# Patient Record
Sex: Female | Born: 1955 | Race: Black or African American | Hispanic: No | Marital: Single | Smoking: Current some day smoker
Health system: Southern US, Community
[De-identification: ages and names within clinical notes are randomized; demographics above are authoritative.]

## PROBLEM LIST (undated history)

## (undated) DIAGNOSIS — T8859XA Other complications of anesthesia, initial encounter: Secondary | ICD-10-CM

## (undated) DIAGNOSIS — I209 Angina pectoris, unspecified: Secondary | ICD-10-CM

## (undated) DIAGNOSIS — E119 Type 2 diabetes mellitus without complications: Secondary | ICD-10-CM

## (undated) DIAGNOSIS — G43909 Migraine, unspecified, not intractable, without status migrainosus: Secondary | ICD-10-CM

## (undated) DIAGNOSIS — B001 Herpesviral vesicular dermatitis: Secondary | ICD-10-CM

## (undated) DIAGNOSIS — M1990 Unspecified osteoarthritis, unspecified site: Secondary | ICD-10-CM

## (undated) DIAGNOSIS — R51 Headache: Secondary | ICD-10-CM

## (undated) DIAGNOSIS — F419 Anxiety disorder, unspecified: Secondary | ICD-10-CM

## (undated) DIAGNOSIS — E7800 Pure hypercholesterolemia, unspecified: Secondary | ICD-10-CM

## (undated) DIAGNOSIS — F259 Schizoaffective disorder, unspecified: Secondary | ICD-10-CM

## (undated) DIAGNOSIS — F32A Depression, unspecified: Secondary | ICD-10-CM

## (undated) DIAGNOSIS — K219 Gastro-esophageal reflux disease without esophagitis: Secondary | ICD-10-CM

## (undated) DIAGNOSIS — F329 Major depressive disorder, single episode, unspecified: Secondary | ICD-10-CM

## (undated) DIAGNOSIS — I10 Essential (primary) hypertension: Secondary | ICD-10-CM

## (undated) DIAGNOSIS — R519 Headache, unspecified: Secondary | ICD-10-CM

## (undated) DIAGNOSIS — F319 Bipolar disorder, unspecified: Secondary | ICD-10-CM

## (undated) DIAGNOSIS — G5600 Carpal tunnel syndrome, unspecified upper limb: Secondary | ICD-10-CM

## (undated) DIAGNOSIS — K635 Polyp of colon: Secondary | ICD-10-CM

## (undated) HISTORY — PX: HEMORRHOID SURGERY: SHX153

## (undated) HISTORY — DX: Bipolar disorder, unspecified: F31.9

## (undated) HISTORY — DX: Unspecified osteoarthritis, unspecified site: M19.90

## (undated) HISTORY — DX: Polyp of colon: K63.5

## (undated) HISTORY — PX: OTHER SURGICAL HISTORY: SHX169

## (undated) HISTORY — DX: Gastro-esophageal reflux disease without esophagitis: K21.9

## (undated) HISTORY — PX: MULTIPLE TOOTH EXTRACTIONS: SHX2053

## (undated) HISTORY — DX: Carpal tunnel syndrome, unspecified upper limb: G56.00

## (undated) HISTORY — DX: Depression, unspecified: F32.A

## (undated) HISTORY — DX: Essential (primary) hypertension: I10

## (undated) HISTORY — DX: Herpesviral vesicular dermatitis: B00.1

## (undated) HISTORY — DX: Type 2 diabetes mellitus without complications: E11.9

## (undated) HISTORY — DX: Pure hypercholesterolemia, unspecified: E78.00

## (undated) HISTORY — PX: PITUITARY SURGERY: SHX203

## (undated) HISTORY — PX: CARPAL TUNNEL RELEASE: SHX101

## (undated) HISTORY — DX: Major depressive disorder, single episode, unspecified: F32.9

## (undated) HISTORY — DX: Headache: R51

## (undated) HISTORY — DX: Headache, unspecified: R51.9

---

## 1997-11-20 ENCOUNTER — Emergency Department (HOSPITAL_COMMUNITY): Admission: EM | Admit: 1997-11-20 | Discharge: 1997-11-20 | Payer: Self-pay | Admitting: Emergency Medicine

## 2000-10-23 ENCOUNTER — Emergency Department (HOSPITAL_COMMUNITY): Admission: EM | Admit: 2000-10-23 | Discharge: 2000-10-23 | Payer: Self-pay | Admitting: *Deleted

## 2000-10-23 ENCOUNTER — Encounter: Payer: Self-pay | Admitting: Emergency Medicine

## 2000-11-15 ENCOUNTER — Emergency Department (HOSPITAL_COMMUNITY): Admission: EM | Admit: 2000-11-15 | Discharge: 2000-11-15 | Payer: Self-pay | Admitting: Emergency Medicine

## 2001-05-25 ENCOUNTER — Emergency Department (HOSPITAL_COMMUNITY): Admission: EM | Admit: 2001-05-25 | Discharge: 2001-05-25 | Payer: Self-pay | Admitting: Emergency Medicine

## 2001-09-27 ENCOUNTER — Emergency Department (HOSPITAL_COMMUNITY): Admission: EM | Admit: 2001-09-27 | Discharge: 2001-09-27 | Payer: Self-pay | Admitting: Emergency Medicine

## 2002-02-04 ENCOUNTER — Encounter: Payer: Self-pay | Admitting: Emergency Medicine

## 2002-02-04 ENCOUNTER — Emergency Department (HOSPITAL_COMMUNITY): Admission: EM | Admit: 2002-02-04 | Discharge: 2002-02-04 | Payer: Self-pay | Admitting: Emergency Medicine

## 2002-08-28 ENCOUNTER — Ambulatory Visit (HOSPITAL_COMMUNITY): Admission: RE | Admit: 2002-08-28 | Discharge: 2002-08-28 | Payer: Self-pay | Admitting: Family Medicine

## 2002-08-28 ENCOUNTER — Encounter: Payer: Self-pay | Admitting: Family Medicine

## 2002-09-26 ENCOUNTER — Encounter: Payer: Self-pay | Admitting: *Deleted

## 2002-09-26 ENCOUNTER — Ambulatory Visit (HOSPITAL_COMMUNITY): Admission: RE | Admit: 2002-09-26 | Discharge: 2002-09-26 | Payer: Self-pay | Admitting: *Deleted

## 2002-12-02 ENCOUNTER — Emergency Department (HOSPITAL_COMMUNITY): Admission: EM | Admit: 2002-12-02 | Discharge: 2002-12-02 | Payer: Self-pay | Admitting: Emergency Medicine

## 2003-10-17 ENCOUNTER — Emergency Department (HOSPITAL_COMMUNITY): Admission: EM | Admit: 2003-10-17 | Discharge: 2003-10-17 | Payer: Self-pay | Admitting: *Deleted

## 2003-10-18 ENCOUNTER — Ambulatory Visit (HOSPITAL_COMMUNITY): Admission: RE | Admit: 2003-10-18 | Discharge: 2003-10-18 | Payer: Self-pay | Admitting: Emergency Medicine

## 2005-03-28 ENCOUNTER — Inpatient Hospital Stay (HOSPITAL_COMMUNITY): Admission: AD | Admit: 2005-03-28 | Discharge: 2005-04-07 | Payer: Self-pay | Admitting: Psychiatry

## 2005-03-29 ENCOUNTER — Ambulatory Visit: Payer: Self-pay | Admitting: Psychiatry

## 2006-05-24 ENCOUNTER — Emergency Department (HOSPITAL_COMMUNITY): Admission: EM | Admit: 2006-05-24 | Discharge: 2006-05-24 | Payer: Self-pay | Admitting: Emergency Medicine

## 2007-02-04 ENCOUNTER — Telehealth (INDEPENDENT_AMBULATORY_CARE_PROVIDER_SITE_OTHER): Payer: Self-pay | Admitting: *Deleted

## 2007-03-24 ENCOUNTER — Ambulatory Visit: Payer: Self-pay | Admitting: Internal Medicine

## 2007-03-24 DIAGNOSIS — F323 Major depressive disorder, single episode, severe with psychotic features: Secondary | ICD-10-CM

## 2007-03-24 DIAGNOSIS — F411 Generalized anxiety disorder: Secondary | ICD-10-CM

## 2007-03-24 DIAGNOSIS — M25519 Pain in unspecified shoulder: Secondary | ICD-10-CM

## 2007-03-24 DIAGNOSIS — K089 Disorder of teeth and supporting structures, unspecified: Secondary | ICD-10-CM | POA: Insufficient documentation

## 2007-03-30 ENCOUNTER — Encounter (INDEPENDENT_AMBULATORY_CARE_PROVIDER_SITE_OTHER): Payer: Self-pay | Admitting: Internal Medicine

## 2007-04-07 HISTORY — PX: NECK SURGERY: SHX720

## 2007-05-10 ENCOUNTER — Other Ambulatory Visit: Admission: RE | Admit: 2007-05-10 | Discharge: 2007-05-10 | Payer: Self-pay | Admitting: Internal Medicine

## 2007-05-10 ENCOUNTER — Ambulatory Visit: Payer: Self-pay | Admitting: Internal Medicine

## 2007-05-10 ENCOUNTER — Encounter (INDEPENDENT_AMBULATORY_CARE_PROVIDER_SITE_OTHER): Payer: Self-pay | Admitting: Internal Medicine

## 2007-05-10 DIAGNOSIS — B354 Tinea corporis: Secondary | ICD-10-CM | POA: Insufficient documentation

## 2007-05-10 LAB — CONVERTED CEMR LAB
Glucose, Urine, Semiquant: NEGATIVE
KOH Prep: NEGATIVE
Protein, U semiquant: NEGATIVE
Urobilinogen, UA: 0.2
pH: 7

## 2007-05-12 ENCOUNTER — Encounter (INDEPENDENT_AMBULATORY_CARE_PROVIDER_SITE_OTHER): Payer: Self-pay | Admitting: Internal Medicine

## 2007-05-16 ENCOUNTER — Ambulatory Visit (HOSPITAL_COMMUNITY): Admission: RE | Admit: 2007-05-16 | Discharge: 2007-05-16 | Payer: Self-pay | Admitting: Family Medicine

## 2007-05-17 ENCOUNTER — Telehealth (INDEPENDENT_AMBULATORY_CARE_PROVIDER_SITE_OTHER): Payer: Self-pay | Admitting: Internal Medicine

## 2007-05-17 ENCOUNTER — Encounter (INDEPENDENT_AMBULATORY_CARE_PROVIDER_SITE_OTHER): Payer: Self-pay | Admitting: Internal Medicine

## 2007-05-21 ENCOUNTER — Encounter (INDEPENDENT_AMBULATORY_CARE_PROVIDER_SITE_OTHER): Payer: Self-pay | Admitting: Internal Medicine

## 2007-05-21 DIAGNOSIS — M51379 Other intervertebral disc degeneration, lumbosacral region without mention of lumbar back pain or lower extremity pain: Secondary | ICD-10-CM | POA: Insufficient documentation

## 2007-05-21 DIAGNOSIS — M5137 Other intervertebral disc degeneration, lumbosacral region: Secondary | ICD-10-CM

## 2007-05-24 ENCOUNTER — Ambulatory Visit: Payer: Self-pay | Admitting: Internal Medicine

## 2007-05-24 ENCOUNTER — Telehealth (INDEPENDENT_AMBULATORY_CARE_PROVIDER_SITE_OTHER): Payer: Self-pay | Admitting: Internal Medicine

## 2007-05-26 ENCOUNTER — Encounter (INDEPENDENT_AMBULATORY_CARE_PROVIDER_SITE_OTHER): Payer: Self-pay | Admitting: Internal Medicine

## 2007-05-29 ENCOUNTER — Encounter (INDEPENDENT_AMBULATORY_CARE_PROVIDER_SITE_OTHER): Payer: Self-pay | Admitting: Internal Medicine

## 2007-05-29 LAB — CONVERTED CEMR LAB
ALT: <8 units/L (ref 0–35)
Albumin: 4.3 g/dL (ref 3.5–5.2)
Alkaline Phosphatase: 81 units/L (ref 39–117)
Basophils Relative: 0 % (ref 0–1)
Calcium: 9.9 mg/dL (ref 8.4–10.5)
Chloride: 101 meq/L (ref 96–112)
Creatinine, Ser: 0.75 mg/dL (ref 0.40–1.20)
HDL: 47 mg/dL (ref >39)
Lymphocytes Relative: 45 % (ref 12–46)
MCHC: 33.8 g/dL (ref 30.0–36.0)
MCV: 86.2 fL (ref 78.0–100.0)
Monocytes Relative: 10 % (ref 3–12)
Neutro Abs: 2.7 10*3/uL (ref 1.7–7.7)
Neutrophils Relative %: 42 % — ABNORMAL LOW (ref 43–77)
Platelets: 372 10*3/uL (ref 150–400)
Potassium: 4 meq/L (ref 3.5–5.3)
RBC: 4.43 M/uL (ref 3.87–5.11)
Total Bilirubin: 0.6 mg/dL (ref 0.3–1.2)
Triglycerides: 114 mg/dL (ref <150)

## 2007-08-15 ENCOUNTER — Encounter: Admission: RE | Admit: 2007-08-15 | Discharge: 2007-08-16 | Payer: Self-pay | Admitting: Neurological Surgery

## 2007-09-02 ENCOUNTER — Ambulatory Visit (HOSPITAL_COMMUNITY): Admission: RE | Admit: 2007-09-02 | Discharge: 2007-09-03 | Payer: Self-pay | Admitting: Neurological Surgery

## 2007-10-03 ENCOUNTER — Encounter: Admission: RE | Admit: 2007-10-03 | Discharge: 2007-10-03 | Payer: Self-pay | Admitting: Neurological Surgery

## 2007-12-06 ENCOUNTER — Emergency Department (HOSPITAL_COMMUNITY): Admission: EM | Admit: 2007-12-06 | Discharge: 2007-12-06 | Payer: Self-pay | Admitting: Emergency Medicine

## 2007-12-06 ENCOUNTER — Encounter: Admission: RE | Admit: 2007-12-06 | Discharge: 2007-12-06 | Payer: Self-pay | Admitting: Neurological Surgery

## 2007-12-20 ENCOUNTER — Emergency Department (HOSPITAL_COMMUNITY): Admission: EM | Admit: 2007-12-20 | Discharge: 2007-12-20 | Payer: Self-pay | Admitting: Emergency Medicine

## 2008-04-04 ENCOUNTER — Encounter: Admission: RE | Admit: 2008-04-04 | Discharge: 2008-04-05 | Payer: Self-pay | Admitting: Sports Medicine

## 2008-05-31 ENCOUNTER — Other Ambulatory Visit: Admission: RE | Admit: 2008-05-31 | Discharge: 2008-05-31 | Payer: Self-pay | Admitting: Family Medicine

## 2008-07-03 ENCOUNTER — Ambulatory Visit (HOSPITAL_COMMUNITY): Admission: RE | Admit: 2008-07-03 | Discharge: 2008-07-03 | Payer: Self-pay | Admitting: Family Medicine

## 2008-08-14 ENCOUNTER — Encounter: Admission: RE | Admit: 2008-08-14 | Discharge: 2008-08-14 | Payer: Self-pay | Admitting: Family Medicine

## 2008-09-24 ENCOUNTER — Encounter: Admission: RE | Admit: 2008-09-24 | Discharge: 2008-09-24 | Payer: Self-pay | Admitting: Neurological Surgery

## 2008-10-01 ENCOUNTER — Encounter: Admission: RE | Admit: 2008-10-01 | Discharge: 2008-10-01 | Payer: Self-pay | Admitting: Neurological Surgery

## 2008-12-19 ENCOUNTER — Emergency Department (HOSPITAL_COMMUNITY): Admission: EM | Admit: 2008-12-19 | Discharge: 2008-12-19 | Payer: Self-pay | Admitting: Family Medicine

## 2008-12-28 ENCOUNTER — Ambulatory Visit (HOSPITAL_COMMUNITY): Admission: RE | Admit: 2008-12-28 | Discharge: 2008-12-28 | Payer: Self-pay | Admitting: Neurological Surgery

## 2009-01-17 ENCOUNTER — Ambulatory Visit (HOSPITAL_COMMUNITY): Admission: RE | Admit: 2009-01-17 | Discharge: 2009-01-17 | Payer: Self-pay | Admitting: Neurological Surgery

## 2009-04-06 HISTORY — PX: OTHER SURGICAL HISTORY: SHX169

## 2009-04-08 ENCOUNTER — Encounter: Admission: RE | Admit: 2009-04-08 | Discharge: 2009-04-08 | Payer: Self-pay | Admitting: Neurological Surgery

## 2009-04-23 ENCOUNTER — Encounter: Admission: RE | Admit: 2009-04-23 | Discharge: 2009-04-23 | Payer: Self-pay | Admitting: General Surgery

## 2009-04-26 ENCOUNTER — Ambulatory Visit (HOSPITAL_BASED_OUTPATIENT_CLINIC_OR_DEPARTMENT_OTHER): Admission: RE | Admit: 2009-04-26 | Discharge: 2009-04-26 | Payer: Self-pay | Admitting: General Surgery

## 2009-04-29 ENCOUNTER — Encounter: Admission: RE | Admit: 2009-04-29 | Discharge: 2009-07-28 | Payer: Self-pay | Admitting: Orthopedic Surgery

## 2009-06-14 ENCOUNTER — Other Ambulatory Visit: Admission: RE | Admit: 2009-06-14 | Discharge: 2009-06-14 | Payer: Self-pay | Admitting: Family Medicine

## 2009-06-21 ENCOUNTER — Encounter: Admission: RE | Admit: 2009-06-21 | Discharge: 2009-06-21 | Payer: Self-pay | Admitting: Orthopedic Surgery

## 2009-07-16 ENCOUNTER — Ambulatory Visit (HOSPITAL_COMMUNITY): Admission: RE | Admit: 2009-07-16 | Discharge: 2009-07-16 | Payer: Self-pay | Admitting: Family Medicine

## 2009-08-21 ENCOUNTER — Ambulatory Visit (HOSPITAL_BASED_OUTPATIENT_CLINIC_OR_DEPARTMENT_OTHER): Admission: RE | Admit: 2009-08-21 | Discharge: 2009-08-21 | Payer: Self-pay | Admitting: Orthopedic Surgery

## 2010-04-27 ENCOUNTER — Encounter: Payer: Self-pay | Admitting: Orthopedic Surgery

## 2010-04-28 ENCOUNTER — Encounter: Payer: Self-pay | Admitting: Family Medicine

## 2010-06-22 LAB — BASIC METABOLIC PANEL
CO2: 27 mEq/L (ref 19–32)
Chloride: 105 mEq/L (ref 96–112)
GFR calc Af Amer: >60 mL/min (ref >60)
GFR calc non Af Amer: >60 mL/min (ref >60)
Potassium: 3.9 mEq/L (ref 3.5–5.1)

## 2010-06-22 LAB — CBC
MCHC: 34.4 g/dL (ref 30.0–36.0)
Platelets: 395 10*3/uL (ref 150–400)
RDW: 14 % (ref 11.5–15.5)

## 2010-06-22 LAB — DIFFERENTIAL
Basophils Absolute: 0 10*3/uL (ref 0.0–0.1)
Basophils Relative: 1 % (ref 0–1)
Lymphocytes Relative: 46 % (ref 12–46)
Neutro Abs: 2.4 10*3/uL (ref 1.7–7.7)

## 2010-06-23 LAB — BASIC METABOLIC PANEL
BUN: 12 mg/dL (ref 6–23)
CO2: 29 mEq/L (ref 19–32)
Calcium: 10.7 mg/dL — ABNORMAL HIGH (ref 8.4–10.5)
Chloride: 104 mEq/L (ref 96–112)
Creatinine, Ser: 1.05 mg/dL (ref 0.4–1.2)
GFR calc Af Amer: >60 mL/min (ref >60)
GFR calc non Af Amer: 55 mL/min — ABNORMAL LOW (ref >60)
Glucose, Bld: 95 mg/dL (ref 70–99)
Potassium: 4 mEq/L (ref 3.5–5.1)
Sodium: 138 mEq/L (ref 135–145)

## 2010-06-23 LAB — POCT HEMOGLOBIN-HEMACUE: Hemoglobin: 14.3 g/dL (ref 12.0–15.0)

## 2010-07-10 LAB — DIFFERENTIAL
Eosinophils Absolute: 0.2 10*3/uL (ref 0.0–0.7)
Lymphocytes Relative: 52 % — ABNORMAL HIGH (ref 12–46)
Lymphs Abs: 2.9 10*3/uL (ref 0.7–4.0)
Monocytes Relative: 5 % (ref 3–12)
Neutrophils Relative %: 38 % — ABNORMAL LOW (ref 43–77)

## 2010-07-10 LAB — CBC
MCV: 87.6 fL (ref 78.0–100.0)
Platelets: 401 10*3/uL — ABNORMAL HIGH (ref 150–400)
RBC: 4.35 MIL/uL (ref 3.87–5.11)
WBC: 5.6 10*3/uL (ref 4.0–10.5)

## 2010-07-10 LAB — BASIC METABOLIC PANEL
BUN: 9 mg/dL (ref 6–23)
Chloride: 102 mEq/L (ref 96–112)
Creatinine, Ser: 0.93 mg/dL (ref 0.4–1.2)
GFR calc Af Amer: >60 mL/min (ref >60)
GFR calc non Af Amer: >60 mL/min (ref >60)
Potassium: 3.3 mEq/L — ABNORMAL LOW (ref 3.5–5.1)

## 2010-07-10 LAB — APTT: aPTT: 26 seconds (ref 24–37)

## 2010-07-10 LAB — PROTIME-INR
INR: 0.89 (ref 0.00–1.49)
Prothrombin Time: 12 seconds (ref 11.6–15.2)

## 2010-07-11 LAB — DIFFERENTIAL
Basophils Absolute: 0.1 10*3/uL (ref 0.0–0.1)
Eosinophils Relative: 3 % (ref 0–5)
Lymphocytes Relative: 43 % (ref 12–46)
Lymphs Abs: 2.6 10*3/uL (ref 0.7–4.0)
Monocytes Absolute: 0.5 10*3/uL (ref 0.1–1.0)
Monocytes Relative: 8 % (ref 3–12)
Neutro Abs: 2.7 10*3/uL (ref 1.7–7.7)

## 2010-07-11 LAB — CBC
HCT: 37 % (ref 36.0–46.0)
Hemoglobin: 12.5 g/dL (ref 12.0–15.0)
RBC: 4.18 MIL/uL (ref 3.87–5.11)
RDW: 14.5 % (ref 11.5–15.5)
WBC: 6 10*3/uL (ref 4.0–10.5)

## 2010-07-11 LAB — BASIC METABOLIC PANEL
Calcium: 10 mg/dL (ref 8.4–10.5)
GFR calc Af Amer: >60 mL/min (ref >60)
GFR calc non Af Amer: 58 mL/min — ABNORMAL LOW (ref >60)
Potassium: 4 mEq/L (ref 3.5–5.1)
Sodium: 138 mEq/L (ref 135–145)

## 2010-07-11 LAB — APTT: aPTT: 26 seconds (ref 24–37)

## 2010-07-25 ENCOUNTER — Other Ambulatory Visit (HOSPITAL_COMMUNITY): Payer: Self-pay | Admitting: Family Medicine

## 2010-08-04 ENCOUNTER — Ambulatory Visit (HOSPITAL_COMMUNITY): Payer: Self-pay

## 2010-08-04 ENCOUNTER — Encounter (HOSPITAL_COMMUNITY)
Admission: RE | Admit: 2010-08-04 | Discharge: 2010-08-04 | Disposition: A | Discharge status: Home / Self Care | Payer: Medicaid Other | Source: Ambulatory Visit | Attending: Family Medicine | Admitting: Family Medicine

## 2010-08-04 DIAGNOSIS — E213 Hyperparathyroidism, unspecified: Secondary | ICD-10-CM | POA: Insufficient documentation

## 2010-08-04 MED ORDER — TECHNETIUM TC 99M SESTAMIBI - CARDIOLITE
23.5000 | Freq: Once | INTRAVENOUS | Status: AC | PRN
  Administered 2010-08-04: 08:00:00 23.5 via INTRAVENOUS

## 2010-08-19 NOTE — Op Note (Signed)
NAME:  Gloria Lewis, PRIEGO NO.:  [id number]   MEDICAL RECORD NO.:  [id number]          PATIENT TYPE:  OIB   LOCATION:  3528                         FACILITY:  [facility]   PHYSICIAN:  Tia Alert, MD     DATE OF BIRTH:  [birth year]   DATE OF PROCEDURE:  09/02/2007  DATE OF DISCHARGE:                               OPERATIVE REPORT   PREOPERATIVE DIAGNOSES:  Cervical spondylosis with neural foraminal  stenosis, C5-C6 and C6-C7 with left arm pain.   POSTOPERATIVE DIAGNOSES:  Cervical spondylosis with neural foraminal  stenosis, C5-C6 and C6-C7 with left arm pain.   PROCEDURES:  1. Decompressive anterior cervical diskectomy, C5-C6 and C6-C7.  2. Anterior cervical arthrodesis, C5-C6 and C6-C7, utilizing a 6-mm      corticocancellous allograft at C5-C6 and a 7-mm corticocancellous      allograft at C6-C7.  3. Anterior cervical plating, C5-C7, inclusive utilizing a 38-mm      NuVasive helix plate.   SURGEON:  Tia Alert, MD.   ASSISTANT:  Kathaleen Maser. Pool, MD   ANESTHESIA:  General endotracheal.   COMPLICATIONS:  None apparent.   INDICATIONS FOR PROCEDURE:  Ms. Tyer is a 55 year old female who was  referred with severe left arm pain.  She had an MRI, which showed  significant foraminal stenosis at C5-C6 and C6-C7 on the left.  I  recommended a 2-level anterior cervical diskectomy, fusion, and plating  in hopes of improving her pain syndrome.  She understood the risks,  benefits, and its expected outcome and wished to proceed.   DESCRIPTION OF PROCEDURE:  The patient was taken to the operating room,  and after induction of adequate generalized endotracheal anesthesia, she  was placed in the supine position on the operating room table.  Her  right anterior cervical region was prepped with DuraPrep and draped in  the usual sterile fashion.  A 5 mL of local anesthesia was injected and  a transverse incision was made to the right of midline and carried down  to the  platysma which was elevated, opened, and undermined with  Metzenbaum scissors.  I then dissected a plane medial to the  sternocleidomastoid muscle, internal carotid artery, and lateral to the  trachea and esophagus to expose C5-C6 and C6-C7.  Intraoperative  fluoroscopy confirmed my level and then I took down the longus colli  muscles and placed the shadow line retractors under these to expose C5-  C7.  I then incised the annulus at C5-C6 and C6-C7 and the initial  diskectomy was done with pituitary rongeurs and curved curettes.  I then  used the high-speed drill to drill the endplates down to the level of  the posterior longitudinal ligament.  The ligament was opened at C5-C6  and removed in a piecemeal fashion while undercutting the bodies of C5-  C6 and marched along the superior endplate of C6 to identify the  pedicles bilaterally, marched along the pedicles to identify the C6  nerve roots, marched along the nerve roots until I was distal to the  pedicle and then palpated with a nerve hook in  a circumferential fashion  to assure adequate decompression.  I could see the cord pulsatile  through the dura.  The nerve root was free in C6 on the left.  I  palpated it with a nerve hook and felt no significant compression.  The  dura was full all the way across and no longer pushed away.  I then  irrigated it with saline solution.  I went onto the C6-C7 and performed  the exact same decompression, opening the post-longitudinal ligament and  removing in a piecemeal fashion while undercutting bodies of C6-C7.  Again, I marched until the pedicles were identified.  I marched along  the pedicles to identify the C7 nerve roots and then decompressed them  distally in the foramen until I was distal to the pedicle and the nerve  root was well decompressed.  I then palpated it in a circumferential  fashion with a nerve hook to assure adequate decompression.  I then  irrigated it once again with saline  solution, dried the surgical bed to  measure the interspaces, and used a 6-mm corticocancellous allograft at  C5-C6 and a 7-mm graft at C6-C7, tapped these into position.  I then  used a 38-mm NuVasive helix plate and placed 213-mm variable angle  screws in the bodies of C5, C6, and C7.  These locked in the plate by  locking mechanism within the plate.  I then irrigated with saline  solution containing bacitracin, spent a considerable time drying the  surgical bed with a bipolar cautery and once meticulous hemostasis was  achieved, I closed the platysma with 3-0 Vicryl, closed the subcuticular  tissue with 3-0 Vicryl, and closed the skin with benzoin and Steri-  Strips.  The drapes were removed.  Sterile dressing was applied.  The  patient was awakened from general anesthesia and transferred to the  recovery room in stable condition.  At the end of the procedure, all  sponge, needle, and instrument counts were correct.      Tia Alert, MD  Electronically Signed     DSJ/MEDQ  D:  09/02/2007  T:  09/03/2007  Job:  [id number]

## 2010-08-22 NOTE — Discharge Summary (Signed)
NAME:  Gloria Lewis, Gloria Lewis NO.:  [id number]   MEDICAL RECORD NO.:  [id number]          PATIENT TYPE:  IPS   LOCATION:  0506                          [facility]ACILITY[facility]  [facility]   PHYSICIAN:  Geoffery Lyons, M.D.      DATE OF BIRTH:  1955-04-11   DATE OF ADMISSION:  03/28/2005  DATE OF DISCHARGE:  04/07/2005                                 DISCHARGE SUMMARY   CHIEF COMPLAINT AND PRESENT ILLNESS:  This was the first admission to [facility] for this 55 year old single African-American female.  Presented to the emergency room at [facility] reporting suicidal thoughts.  Endorsed she had been depressed.  Endorsed interpersonal conflict.  Reported  headache, back pain, generalized chest pain from crack cocaine for a day  prior to her evaluation.  She endorsed that she had reached the end of her  rope.  She had tried to harm herself the week before by trying to take an  overdose of Risperdal.  Suicidal ideation have been increasing over the past  week, infrequently for the past two months.   PAST PSYCHIATRIC HISTORY:  Four months prior to this admission, her primary  care Jaden Batchelder started on Risperdal.  She was taking 1 mg in the morning and  1 mg at bedtime.   ALCOHOL/DRUG HISTORY:  Began using alcohol at age 55.  Drinking one pint a  day.  Using cocaine.  Began using at age 11.   PAST MEDICAL HISTORY:  Back pain.   MEDICATIONS:  She was also prescribed Cymbalta but she did not take as it  made her feel like she was on uppers.   PHYSICAL EXAMINATION:  Performed and failed to show any acute findings.   LABORATORY DATA:  Drug screen positive for cocaine.  Blood chemistry with  glucose 129, BUN 4, creatinine 1.0.   MENTAL STATUS EXAM:  Casually dressed female.  Speech was normal in rate,  rhythm and tone.  Mood was anxious, depressed.  Affect was labile.  Thought  processes were clear, rational and goal-oriented.  Endorsed that she was  done with cocaine.  Judgment  and insight were intact.  Endorsed suicidal  ruminations.  Wanted to stay on Risperdal as she felt it was helpful.  No  active hallucinations.   ADMISSION DIAGNOSES:  AXIS I:  Mood disorder not otherwise specified.  Cocaine abuse.  Alcohol abuse.  AXIS II:  No diagnosis.  AXIS III:  No diagnosis.  AXIS IV:  Moderate.  AXIS V:  GAF upon admission 35; highest GAF in the last year 60.   HOSPITAL COURSE:  She was admitted.  She was started in individual and group  psychotherapy.  She was given Librium for detox.  She was started on  Risperdal 2 mg at night and she was given some Neurontin.  She was given  trazodone for sleep.  She was given Symmetrel 100 mg twice a day and placed  on Risperdal 0.5 mg in the morning.  She complained of persistent back pain.  She was placed on Flexeril and ibuprofen.  Also we continued to work  with  the Neurontin and she was given some Seroquel for anxiety.  By December  28th, she was endorsing increased anxiety and cravings, wanting help with a  residential treatment.  Felt that she could not make it without a structure  for the next couple of weeks.  By the 27th, she was still endorsing suicidal  ruminations with cravings, overwhelmed.  Kids were removed by DSS.  Worried,  concerned about them.  Also concerned that, if she did not go to a  residential program, she was not going to be able to make it.  Pain is an  issue, claiming difficulty due to her back pain.  On the 29th, she was still  feeling very depressed, sad, missing her children but encouraged by the fact  that there was a chance she was going to go into a residential treatment  program.  She continued to be stabilized.  We continued to work on her mood,  her anxiety as well as a relapse prevention plan.  On April 07, 2005, she  was in full contact with reality.  Objectively, she was better.  Her mood  was improved.  Affect was brighter.  She was going into [facility] for a 14-day  program.  She was  encouraged by the fact that she could go there.  Endorsed  no active suicidal or homicidal ideation.  Endorsed commitment to  abstinence.   DISCHARGE DIAGNOSES:  AXIS I:  Mood disorder not otherwise specified.  Alcohol and cocaine abuse.  AXIS II:  No diagnosis.  AXIS III:  No diagnosis.  AXIS IV:  Moderate.  AXIS V:  GAF upon discharge 50-55.   DISCHARGE MEDICATIONS:  1.  Symmetrel 100 mg twice a day.  2.  Risperdal 0.5 mg in the morning and at night.  3.  Risperdal 2 mg at night.  4.  Colace 100 mg, 1 twice a day.  5.  Neurontin 300 mg, 1 three times a day.  6.  Claritin 10 mg daily.  7.  Trazodone 100 mg, 1 at night as needed for sleep.  8.  Flexeril 10 mg three times a day for muscle spasms.   FOLLOW UP:  [facility] and to be referred to [facility]      Geoffery Lyons, M.D.  Electronically Signed     IL/MEDQ  D:  04/20/2005  T:  04/20/2005  Job:  [id number]

## 2010-08-22 NOTE — H&P (Signed)
NAME:  Gloria Lewis, Gloria Lewis NO.:  [id number]   MEDICAL RECORD NO.:  [id number]          PATIENT TYPE:  IPS   LOCATION:  0506                          FACILITY[facility]  [facility]   PHYSICIAN:  Syed T. Arfeen, M.D.   DATE OF BIRTH:  [birth year]   DATE OF ADMISSION:  03/28/2005  DATE OF DISCHARGE:                         PSYCHIATRIC ADMISSION ASSESSMENT   IDENTIFYING INFORMATION:  This is a voluntary admission to the services of  Dr. Lolly Mustache.  This is a 55 year old single African-American female.  She  presented to the emergency room at [facility] yesterday about 10:45 at  night and she was reporting suicidal thoughts.  She stated that she has  depression, that there was interpersonal conflict, and she was developing  thoughts of suicidal ideation.  She reports headache, back pain, and  generalized chest pain from crack cocaine use about a day prior to that.  Later on, she reported that she has reached the end of her rope, that she  had tried to harm herself last week by trying to take an overdose of  Risperdal.  She states that her suicidal ideation has been increasing over  the past week and infrequently for the past 2 months.  She could not  contract for safety and hence inpatient admission was suggested.  It appears  that DSS took her children Friday, as she was reported to have been using  cocaine.  The patient reports she has smoked for 10 years but now it is out  of control.  Indeed, she has legal charges pending.  She has a court date  January 18 for breaking and entering a motor vehicle and now she has to face  DSS on January 2 regarding custody of her two sons, ages 47 and 28.   PAST PSYCHIATRIC HISTORY:  She denies any.  She states that about 4 months  ago her primary care Kenna Kirn, Dr. Laurann Montana, started her on Risperdal.  Although the bottle says 2 mg at h.s., she was to take it 1 mg a.m. and h.s.   SOCIAL HISTORY:  She is a high school graduate in 1976.  She states  that she  has been a Architectural technologist, a CNA, she has worked in group homes and  daycare.   FAMILY HISTORY:  She states her mother and grandmother had schizophrenia.  Her 31 year old daughter is bipolar and her brother committed suicide in his  late 43s.   ALCOHOL AND DRUG ABUSE:  She began using alcohol at age 42.  She uses  approximately 1 pint a day, and cocaine, she began using that at age 24, one  gram a day, and her last use was yesterday.   PAST MEDICAL HISTORY:  Medical problems:  Back pain.  Medications:  As  already stated just the Risperdal.  She states that early on she was  prescribed Cymbalta as well, however it made her feel like she was on  uppers.   ALLERGIES:  She stated that VICODIN gives her a headache.   POSITIVE PHYSICAL FINDINGS:  PHYSICAL EXAMINATION:  She has a number of  teeth missing which  ages her considerably.  Other than that, her physical  examination was unremarkable and as per the ER.  She is 5 feet 9 inches,  weighs 171, temperature is 98.7, blood pressure is 124/29, 117/73.  Her  pulse ranges from 73 to 79 and respirations are 20.  Her urine drug screen  was positive for cocaine in the emergency room.  She had no other worrisome  findings and the rest of her labs are pending.   MENTAL STATUS EXAM:  She is alert.  She is casually groomed, dressed and  nourished.  Her speech has a normal rate, rhythm and tone.  Her mood is  labile.  Her affect is congruent.  Her thought processes are clear, rational  and goal oriented.  She states she is done with the cocaine.  Judgment and  insight appear to be intact.  Concentration and memory are intact.  Intelligence is average.  She still feels that she has suicidal ideation and  she wants to be kept on the Risperdal.  She feels it has been helpful.  She  also had a Librium today which she felt was helpful, and given her  background in her family for bipolar as well as a brother suiciding, we will  offer  her some Neurontin 300 mg q.3h while awake to help with her mood, to  help with her anxiety and to help with her back pain.  The social worker  will help her get ready for her plan that she has to present to DSS on  January 2.      Mickie Leonarda Salon, P.A.-C.      Syed T. Lolly Mustache, M.D.  Electronically Signed    MD/MEDQ  D:  03/29/2005  T:  03/29/2005  Job:  [id number]

## 2010-08-26 ENCOUNTER — Other Ambulatory Visit (HOSPITAL_COMMUNITY): Payer: Self-pay | Admitting: Family Medicine

## 2010-08-26 DIAGNOSIS — Z1231 Encounter for screening mammogram for malignant neoplasm of breast: Secondary | ICD-10-CM

## 2010-09-05 ENCOUNTER — Ambulatory Visit (HOSPITAL_COMMUNITY)
Admission: RE | Admit: 2010-09-05 | Discharge: 2010-09-05 | Disposition: A | Discharge status: Home / Self Care | Payer: Medicaid Other | Source: Ambulatory Visit | Attending: Family Medicine | Admitting: Family Medicine

## 2010-09-05 DIAGNOSIS — Z1231 Encounter for screening mammogram for malignant neoplasm of breast: Secondary | ICD-10-CM | POA: Insufficient documentation

## 2010-09-11 ENCOUNTER — Other Ambulatory Visit (INDEPENDENT_AMBULATORY_CARE_PROVIDER_SITE_OTHER): Payer: Self-pay | Admitting: Surgery

## 2010-09-11 ENCOUNTER — Ambulatory Visit (HOSPITAL_COMMUNITY)
Admission: RE | Admit: 2010-09-11 | Discharge: 2010-09-11 | Disposition: A | Discharge status: Home / Self Care | Payer: Medicaid Other | Source: Ambulatory Visit | Attending: Surgery | Admitting: Surgery

## 2010-09-11 ENCOUNTER — Encounter (HOSPITAL_COMMUNITY): Payer: Medicaid Other

## 2010-09-11 DIAGNOSIS — D351 Benign neoplasm of parathyroid gland: Secondary | ICD-10-CM | POA: Insufficient documentation

## 2010-09-11 DIAGNOSIS — Z01810 Encounter for preprocedural cardiovascular examination: Secondary | ICD-10-CM | POA: Insufficient documentation

## 2010-09-11 DIAGNOSIS — Z01812 Encounter for preprocedural laboratory examination: Secondary | ICD-10-CM | POA: Insufficient documentation

## 2010-09-11 DIAGNOSIS — Z01818 Encounter for other preprocedural examination: Secondary | ICD-10-CM | POA: Insufficient documentation

## 2010-09-11 DIAGNOSIS — R059 Cough, unspecified: Secondary | ICD-10-CM | POA: Insufficient documentation

## 2010-09-11 DIAGNOSIS — E213 Hyperparathyroidism, unspecified: Secondary | ICD-10-CM | POA: Insufficient documentation

## 2010-09-11 DIAGNOSIS — R05 Cough: Secondary | ICD-10-CM | POA: Insufficient documentation

## 2010-09-11 DIAGNOSIS — Z01811 Encounter for preprocedural respiratory examination: Secondary | ICD-10-CM

## 2010-09-11 DIAGNOSIS — J984 Other disorders of lung: Secondary | ICD-10-CM | POA: Insufficient documentation

## 2010-09-11 DIAGNOSIS — I10 Essential (primary) hypertension: Secondary | ICD-10-CM | POA: Insufficient documentation

## 2010-09-11 LAB — DIFFERENTIAL
Basophils Relative: 0 % (ref 0–1)
Eosinophils Absolute: 0.2 10*3/uL (ref 0.0–0.7)
Lymphs Abs: 2.9 10*3/uL (ref 0.7–4.0)
Neutro Abs: 2 10*3/uL (ref 1.7–7.7)
Neutrophils Relative %: 36 % — ABNORMAL LOW (ref 43–77)

## 2010-09-11 LAB — URINALYSIS, ROUTINE W REFLEX MICROSCOPIC
Protein, ur: NEGATIVE mg/dL
Specific Gravity, Urine: 1.018 (ref 1.005–1.030)
Urobilinogen, UA: 0.2 mg/dL (ref 0.0–1.0)

## 2010-09-11 LAB — BASIC METABOLIC PANEL
Calcium: 10.4 mg/dL (ref 8.4–10.5)
Creatinine, Ser: 0.91 mg/dL (ref 0.4–1.2)
GFR calc Af Amer: >60 mL/min (ref >60)
GFR calc non Af Amer: >60 mL/min (ref >60)

## 2010-09-11 LAB — SURGICAL PCR SCREEN
MRSA, PCR: NEGATIVE
Staphylococcus aureus: NEGATIVE

## 2010-09-11 LAB — CBC
Hemoglobin: 12.8 g/dL (ref 12.0–15.0)
MCV: 85 fL (ref 78.0–100.0)
Platelets: 328 10*3/uL (ref 150–400)
RBC: 4.53 MIL/uL (ref 3.87–5.11)
WBC: 5.5 10*3/uL (ref 4.0–10.5)

## 2010-09-11 LAB — PROTIME-INR: Prothrombin Time: 12.9 seconds (ref 11.6–15.2)

## 2010-09-19 ENCOUNTER — Observation Stay (HOSPITAL_COMMUNITY)
Admission: RE | Admit: 2010-09-19 | Discharge: 2010-09-20 | Disposition: A | Discharge status: Home / Self Care | Payer: Medicaid Other | Source: Ambulatory Visit | Attending: Surgery | Admitting: Surgery

## 2010-09-19 ENCOUNTER — Other Ambulatory Visit (INDEPENDENT_AMBULATORY_CARE_PROVIDER_SITE_OTHER): Payer: Self-pay | Admitting: Surgery

## 2010-09-19 DIAGNOSIS — Z79899 Other long term (current) drug therapy: Secondary | ICD-10-CM | POA: Insufficient documentation

## 2010-09-19 DIAGNOSIS — F259 Schizoaffective disorder, unspecified: Secondary | ICD-10-CM | POA: Insufficient documentation

## 2010-09-19 DIAGNOSIS — E210 Primary hyperparathyroidism: Principal | ICD-10-CM | POA: Insufficient documentation

## 2010-09-19 DIAGNOSIS — D351 Benign neoplasm of parathyroid gland: Secondary | ICD-10-CM | POA: Insufficient documentation

## 2010-09-19 DIAGNOSIS — Z01812 Encounter for preprocedural laboratory examination: Secondary | ICD-10-CM | POA: Insufficient documentation

## 2010-09-19 DIAGNOSIS — I10 Essential (primary) hypertension: Secondary | ICD-10-CM | POA: Insufficient documentation

## 2010-09-22 NOTE — Op Note (Signed)
NAME:  Gloria Lewis, Gloria Lewis NO.:  [id number]  MEDICAL RECORD NO.:  [id number]  LOCATION:  DAYL                         FACILITY:  [facility]  PHYSICIAN:  Velora Heckler, MD      DATE OF BIRTH:  10-14-1955  DATE OF PROCEDURE:  09/19/2010                               OPERATIVE REPORT   PREOPERATIVE DIAGNOSIS:  Primary hyperparathyroidism.  POSTOPERATIVE DIAGNOSIS:  Primary hyperparathyroidism.  PROCEDURES: 1. Neck exploration, bilateral. 2. Biopsy, left superior parathyroid gland. 3. Right superior parathyroidectomy for adenoma. 4. Right inferior parathyroidectomy for hyperplasia.  SURGEON:  Velora Heckler, MD, FACS  ANESTHESIA:  General.  ESTIMATED BLOOD LOSS:  Minimal.  PREPARATION:  ChloraPrep.  COMPLICATIONS:  None.  INDICATIONS:  The patient is a 55 year old black female referred by Dr. Laurann Montana for primary hyperparathyroidism.  The patient had noted onset of significant fatigue early in the year of 2012.  Laboratory studies showed an elevated calcium level of 10.8.  Intact PTH level was in the upper range of normal at 65.  Sestamibi scan demonstrated abnormal activity in the right inferior position and left superior position suspicious for parathyroid adenomas.  The patient now comes to Surgery for neck exploration.  BODY OF REPORT:  Procedure was done in OR #6 at the [facility].  The patient was brought to the operating room, placed in supine position on the operating room table.  Following administration of general anesthesia, the patient was positioned and then prepped and draped in the usual strict aseptic fashion.  After ascertaining that an adequate level of anesthesia had been achieved, the patient's previous anterior neck incision was reopened and extended towards the left with a #15 blade.  Dissection was carried through subcutaneous tissues and scar tissue.  Hemostasis was obtained with the electrocautery.  Skin flaps  were elevated cephalad and caudad from the thyroid notch to the sternal notch.  A Mahorner self-retaining retractor was placed for exposure.  Strap muscles were incised in the midline. Dissection was begun on the left side.  Strap muscles were reflected to the left exposing the left thyroid lobe. Venous tributaries were divided between small and medium Ligaclips with the electrocautery.  This allows for full mobilization of the left thyroid lobe anteriorly.  Exploration in the tracheoesophageal groove reveals an abnormally enlarged parathyroid gland in the upper position. This was gently dissecteded out and mobilized.  On close inspection, it appears to have a large amount of fatty infiltration.  It does not appear to be overly large.  It is quite soft.  Therefore a biopsy of the gland was taken utilizing a medium Ligaclip and excising approximately 15% of the gland for evaluation.  This was submitted to pathology where Dr. Frederica Kuster confirms parathyroid tissue which does not appear to be hyperplastic or consistent with adenoma.  Therefore the left superior parathyroid was left in situ and marked with a 4-0 Prolene suture for future reference.  Further exploration on the left side in the tracheoesophageal groove and inferiorly into the thyroid thymic tract fails to reveal any evidence of enlarged parathyroid tissue.  Next we turned our attention to the right side.  Strap muscles were reflected laterally.  Venous tributaries were divided between Ligaclips with the electrocautery to allow for mobilization of the right thyroid lobe.  Mobilization reveals an enlarged parathyroid gland immediately inferior to the inferior pole of the right lobe laying against the trachea.  This again has a large amount of fatty tissue around the gland.  Further exploration in the tracheoesophageal groove reveals an enlarged parathyroid gland lying adjacent to the esophagus against the precervical fascia  relatively low in the right neck.  This was gently mobilized.  The gland lies immediately adjacent to the recurrent laryngeal nerve and the recurrent nerve was carefully dissected away from the parathyroid gland.  The recurrent nerve was preserved. Parathyroid gland was then dissected out and the vascular pedicle track cephalad above the level of the inferior thyroid artery.  Therefore this likely represents a right superior parathyroid gland with adenoma which has distended in the right neck.  Vascular pedicle was divided between medium Ligaclips and the entire gland is excised and submitted in its entirety to pathology for review.  Dr. Frederica Kuster confirms parathyroid tissue consistent with parathyroid adenoma weighing approximately 1500 mg.  The inferior right parathyroid gland was also excised due to its increased size and the fact and on sestamibi scan this area did localize with isotope.  Therefore it was excised and submitted to pathology.  Dr. Frederica Kuster again did frozen section and confirms parathyroid tissue with a large amount of fatty infiltration but this does not appear to be a typical adenoma.  Good hemostasis was achieved throughout the neck.  No parathyroid tissue to our knowledge remains on the right side.  There is a left superior parathyroid which has been marked with a Prolene suture.  The left inferior parathyroid gland has not been identified.  Surgicel was placed throughout the operative field.  Strap muscles were reapproximated in the midline with interrupted 3-0 Vicryl sutures. Platysma was closed with interrupted 3-0 Vicryl sutures.  Skin was closed with running 4-0 Monocryl subcuticular suture.  Wound was washed and dried and Benzoin and Steri-Strips were applied.  Sterile dressings were applied.  The patient was awakened from anesthesia and brought to the recovery room.  The patient tolerated the procedure well.   Velora Heckler, MD, FACS     TMG/MEDQ  D:   09/19/2010  T:  09/19/2010  Job:  [id number]  cc:   Stacie Acres. Cliffton Asters, M.D. Fax: [phone]  Electronically Signed by Darnell Level MD on 09/22/2010 09:25:43 AM

## 2010-09-30 ENCOUNTER — Other Ambulatory Visit (INDEPENDENT_AMBULATORY_CARE_PROVIDER_SITE_OTHER): Payer: Self-pay | Admitting: General Surgery

## 2010-09-30 MED ORDER — HYDROCODONE-ACETAMINOPHEN 5-325 MG PO TABS: 1.0000 | ORAL_TABLET | ORAL | Status: AC | PRN

## 2010-09-30 NOTE — Telephone Encounter (Signed)
Pt called for pain med refill after surgery/ I called in the hydrocodone per refill protocol/ generic allowed/ [facility]/ [address]/ [phone]. Pt aware.

## 2010-10-06 ENCOUNTER — Ambulatory Visit (INDEPENDENT_AMBULATORY_CARE_PROVIDER_SITE_OTHER): Payer: Medicaid Other | Admitting: Surgery

## 2010-10-06 ENCOUNTER — Encounter (INDEPENDENT_AMBULATORY_CARE_PROVIDER_SITE_OTHER): Payer: Self-pay | Admitting: Surgery

## 2010-10-06 DIAGNOSIS — E210 Primary hyperparathyroidism: Secondary | ICD-10-CM

## 2010-10-06 DIAGNOSIS — D351 Benign neoplasm of parathyroid gland: Secondary | ICD-10-CM | POA: Insufficient documentation

## 2010-10-06 LAB — CALCIUM: Calcium: 10.1 mg/dL (ref 8.4–10.5)

## 2010-10-06 NOTE — Progress Notes (Signed)
HISTORY: Patient returns for followup having undergone neck exploration and parathyroidectomy on September 19, 2010.  PERTINENT REVIEW OF SYSTEMS: Patient notes continued to fatigue. She complains of shoulder and upper extremity discomfort. She has no specific complaints related to her neck procedure.  EXAM: Neck incision is well healed. Minimal soft tissue swelling. No sign of seromatous. No sign of infection. Voice quality is normal.  IMPRESSION: Status post parathyroidectomy. Satisfactory wound healing progress.  PLAN: Patient will have a calcium level drawn today at the lab. She will begin applying topical creams to her incision. She will return to see me in the office in 6 weeks for final wound check. We will check a calcium level and a parathyroid hormone level prior to that office visit.

## 2010-10-20 NOTE — Discharge Summary (Signed)
  NAMEMarland Lewis  Gloria, Lewis NO.:  [id number]  MEDICAL RECORD NO.:  [id number]  LOCATION:  1527                         FACILITY:  [facility]  PHYSICIAN:  Velora Heckler, MD      DATE OF BIRTH:  1956-01-09  DATE OF ADMISSION:  09/19/2010 DATE OF DISCHARGE:  09/20/2010                              DISCHARGE SUMMARY   REASON FOR ADMISSION:  Primary hyperparathyroidism.  BRIEF HISTORY:  The patient is a 55 year old black female referred by Dr. Laurann Montana with primary hyperparathyroidism.  The patient had elevated serum calcium level of 10.8 with upper range of normal, intact PTH level of 65.  Nuclear medicine scanning showed evidence of abnormal activity in the right inferior position and left superior position suspicious for bilateral parathyroid adenoma.  The patient now comes to Surgery for neck exploration.  HOSPITAL COURSE:  The patient was admitted on the day of surgery, September 19, 2010, to the surgical service.  She was taken to the operating room where she underwent neck exploration.  She was found to have a right superior parathyroid adenoma.  Biopsies were taken of other parathyroid glands.  Final pathologic report showed normal parathyroid tissue in the left superior position.  Normal parathyroid tissue in the right inferior position.  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  The patient did well postoperatively.  She was observed overnight.  She had a decrease in her serum calcium level from preoperative levels.  She was prepared for discharge home on the first postoperative day.  DISCHARGE/PLAN:  The patient is discharged home on September 20, 2010, in good condition, tolerating a regular diet, and ambulating independently.  DISCHARGE MEDICATIONS:  Include Percocet as needed for pain.  The patient will return to my office for followup in 2 to 3 weeks.  FINAL DIAGNOSIS:  Primary hyperparathyroidism, final pathologic results pending at  the time of discharge.  CONDITION AT DISCHARGE:  Good.     Velora Heckler, MD     TMG/MEDQ  D:  10/19/2010  T:  10/19/2010  Job:  [id number]  cc:   Stacie Acres. Cliffton Asters, M.D. Fax: [phone]  Electronically Signed by Darnell Level MD on 10/20/2010 07:34:24 AM

## 2010-12-31 LAB — CBC
MCV: 86.6
RBC: 4.41
WBC: 8.4

## 2010-12-31 LAB — DIFFERENTIAL
Lymphs Abs: 3.9
Monocytes Relative: 7
Neutro Abs: 3.5
Neutrophils Relative %: 41 — ABNORMAL LOW

## 2010-12-31 LAB — BASIC METABOLIC PANEL
Calcium: 9.8
Chloride: 100
Creatinine, Ser: 0.86
GFR calc Af Amer: >60

## 2010-12-31 LAB — APTT: aPTT: 27

## 2010-12-31 LAB — PROTIME-INR: INR: 0.9

## 2011-01-09 ENCOUNTER — Other Ambulatory Visit: Payer: Self-pay | Admitting: Obstetrics and Gynecology

## 2011-01-09 ENCOUNTER — Other Ambulatory Visit (HOSPITAL_COMMUNITY)
Admission: RE | Admit: 2011-01-09 | Discharge: 2011-01-09 | Disposition: A | Discharge status: Home / Self Care | Payer: Medicaid Other | Source: Ambulatory Visit | Attending: Obstetrics and Gynecology | Admitting: Obstetrics and Gynecology

## 2011-01-09 DIAGNOSIS — Z01419 Encounter for gynecological examination (general) (routine) without abnormal findings: Secondary | ICD-10-CM | POA: Insufficient documentation

## 2011-05-04 ENCOUNTER — Other Ambulatory Visit: Payer: Self-pay | Admitting: Neurological Surgery

## 2011-05-04 DIAGNOSIS — M542 Cervicalgia: Secondary | ICD-10-CM

## 2011-05-06 ENCOUNTER — Ambulatory Visit: Payer: Medicaid Other | Attending: Physical Medicine and Rehabilitation | Admitting: Physical Therapy

## 2011-05-06 DIAGNOSIS — M25559 Pain in unspecified hip: Secondary | ICD-10-CM | POA: Insufficient documentation

## 2011-05-06 DIAGNOSIS — M545 Low back pain, unspecified: Secondary | ICD-10-CM | POA: Insufficient documentation

## 2011-05-06 DIAGNOSIS — IMO0001 Reserved for inherently not codable concepts without codable children: Secondary | ICD-10-CM | POA: Insufficient documentation

## 2011-05-08 ENCOUNTER — Emergency Department (HOSPITAL_COMMUNITY): Payer: Medicaid Other

## 2011-05-08 ENCOUNTER — Encounter (HOSPITAL_COMMUNITY): Payer: Self-pay | Admitting: Emergency Medicine

## 2011-05-08 ENCOUNTER — Emergency Department (HOSPITAL_COMMUNITY)
Admission: EM | Admit: 2011-05-08 | Discharge: 2011-05-08 | Disposition: A | Discharge status: Home / Self Care | Payer: Medicaid Other | Attending: Emergency Medicine | Admitting: Emergency Medicine

## 2011-05-08 DIAGNOSIS — M545 Low back pain, unspecified: Secondary | ICD-10-CM | POA: Insufficient documentation

## 2011-05-08 DIAGNOSIS — M1990 Unspecified osteoarthritis, unspecified site: Secondary | ICD-10-CM | POA: Insufficient documentation

## 2011-05-08 DIAGNOSIS — R5381 Other malaise: Secondary | ICD-10-CM | POA: Insufficient documentation

## 2011-05-08 DIAGNOSIS — M5416 Radiculopathy, lumbar region: Secondary | ICD-10-CM

## 2011-05-08 DIAGNOSIS — K219 Gastro-esophageal reflux disease without esophagitis: Secondary | ICD-10-CM | POA: Insufficient documentation

## 2011-05-08 DIAGNOSIS — F319 Bipolar disorder, unspecified: Secondary | ICD-10-CM | POA: Insufficient documentation

## 2011-05-08 DIAGNOSIS — IMO0002 Reserved for concepts with insufficient information to code with codable children: Secondary | ICD-10-CM | POA: Insufficient documentation

## 2011-05-08 DIAGNOSIS — I10 Essential (primary) hypertension: Secondary | ICD-10-CM | POA: Insufficient documentation

## 2011-05-08 DIAGNOSIS — J3489 Other specified disorders of nose and nasal sinuses: Secondary | ICD-10-CM | POA: Insufficient documentation

## 2011-05-08 DIAGNOSIS — R509 Fever, unspecified: Secondary | ICD-10-CM | POA: Insufficient documentation

## 2011-05-08 DIAGNOSIS — J069 Acute upper respiratory infection, unspecified: Secondary | ICD-10-CM

## 2011-05-08 DIAGNOSIS — E7800 Pure hypercholesterolemia, unspecified: Secondary | ICD-10-CM | POA: Insufficient documentation

## 2011-05-08 LAB — URINALYSIS, ROUTINE W REFLEX MICROSCOPIC
Nitrite: NEGATIVE
Specific Gravity, Urine: 1.017 (ref 1.005–1.030)
Urobilinogen, UA: 1 mg/dL (ref 0.0–1.0)

## 2011-05-08 LAB — URINE MICROSCOPIC-ADD ON

## 2011-05-08 MED ORDER — OXYCODONE-ACETAMINOPHEN 5-325 MG PO TABS
1.0000 | ORAL_TABLET | Freq: Once | ORAL | Status: AC
  Administered 2011-05-08: 1 via ORAL
  Filled 2011-05-08: qty 1

## 2011-05-08 MED ORDER — PREDNISONE 20 MG PO TABS: 40.0000 mg | ORAL_TABLET | Freq: Every day | ORAL | Status: DC

## 2011-05-08 MED ORDER — CETIRIZINE-PSEUDOEPHEDRINE ER 5-120 MG PO TB12: 1.0000 | ORAL_TABLET | Freq: Every day | ORAL | Status: DC

## 2011-05-08 MED ORDER — OXYCODONE-ACETAMINOPHEN 5-325 MG PO TABS: 1.0000 | ORAL_TABLET | ORAL | Status: AC | PRN

## 2011-05-08 NOTE — ED Notes (Signed)
PT. REPORTS CHRONIC LOW BACK PAIN FOR SEVERAL MONTHS , HEADACHE , BILATERAL ARM NUMBNESS FOR 1 YEAR .

## 2011-05-08 NOTE — ED Provider Notes (Signed)
History     CSN: [id number]  Arrival date & time 05/08/11  4010   First MD Initiated Contact with Patient 05/08/11 [id number]      Chief Complaint  Patient presents with  . Back Pain    (Consider location/radiation/quality/duration/timing/severity/associated sxs/prior treatment) Patient is a 56 y.o. female presenting with back pain. The history is provided by the patient.  Back Pain  This is a new problem. The current episode started more than 1 week ago. Associated symptoms include a fever. Pertinent negatives include no dysuria.  Pt reports chronic lower back pain. Seeing a specialist for it.  States waiting on getting injection for the "nerve pain." States this morning woke up with generalized malaise, nasal congestion, watery eyes, chills, and increased lower back pain. States She did not take any medications, came here. Measured temp at home, states it was 101. States also feels like she is wheezing. Pt states she feels weak. Denies weakness or numbness in legs. Denies loss of bowels, urinary incontinence or retention. Denies new injuries. No abdominal pain.  Past Medical History  Diagnosis Date  . HTN (hypertension)   . Bipolar 1 disorder   . OA (osteoarthritis)   . GERD (gastroesophageal reflux disease)   . Hypercholesterolemia   . Fever blister   . HA (headache)   . Colon polyp   . CTS (carpal tunnel syndrome)     Past Surgical History  Procedure Date  . Neck surgery 2009  . Carpal tunnel release 20110 rt/lt  . Polp removed 2011    Family History  Problem Relation Age of Onset  . Coronary artery disease Father   . Hypertension Mother   . Diabetes Mother   . Schizophrenia Mother   . Depression Brother   . Prostate cancer Brother     History  Substance Use Topics  . Smoking status: Current Everyday Smoker  . Smokeless tobacco: Not on file  . Alcohol Use: No    OB History    Grav Para Term Preterm Abortions TAB SAB Ect Mult Living                  Review  of Systems  Constitutional: Positive for fever, chills and fatigue.  HENT: Positive for congestion. Negative for sore throat and neck stiffness.   Eyes: Positive for discharge.  Respiratory: Positive for wheezing. Negative for cough and chest tightness.   Cardiovascular: Negative.   Gastrointestinal: Negative.   Genitourinary: Negative for dysuria, flank pain, vaginal bleeding and vaginal discharge.  Musculoskeletal: Positive for back pain.  Skin: Negative.   Neurological: Negative.   Psychiatric/Behavioral: Negative.     Allergies  Amoxicillin; Chantix; Effexor; Hydrocodone; Paxil; and Penicillins  Home Medications   Current Outpatient Rx  Name Route Sig Dispense Refill  . CYCLOBENZAPRINE HCL 10 MG PO TABS Oral Take 10 mg by mouth 3 (three) times daily as needed.      . MULTI-VITAMIN/MINERALS PO TABS Oral Take 1 tablet by mouth daily.      . QUETIAPINE FUMARATE ER 300 MG PO TB24 Oral Take 300 mg by mouth at bedtime.    Marland Kitchen QUETIAPINE FUMARATE 100 MG PO TABS Oral Take 100 mg by mouth daily. 300 mg nightly, 100 mg as needed    . SERTRALINE HCL 100 MG PO TABS Oral Take 100 mg by mouth daily.    . TRIAMTERENE-HCTZ 37.5-25 MG PO TABS Oral Take 1 tablet by mouth daily.      Marland Kitchen VITAMIN B-12 100 MCG PO  TABS Oral Take 100 mcg by mouth daily.       BP 131/77  Pulse 84  Temp(Src) 98.2 F (36.8 C) (Oral)  Resp 20  SpO2 97%  Physical Exam  Nursing note and vitals reviewed. Constitutional: She is oriented to person, place, and time. She appears well-developed and well-nourished. No distress.  HENT:  Head: Normocephalic.  Eyes: Conjunctivae are normal.  Neck: Neck supple.  Cardiovascular: Normal rate, regular rhythm and normal heart sounds.   Pulmonary/Chest: Effort normal and breath sounds normal.  Abdominal: Soft. Bowel sounds are normal. She exhibits no distension.  Musculoskeletal: Normal range of motion.  Neurological: She is alert and oriented to person, place, and time.  Skin:  Skin is warm and dry.  Psychiatric: She has a normal mood and affect.    ED Course  Procedures (including critical care time)  Pt with chronic back pain, this morning onset of URI symptoms and fever. Pt neurologically intact. No loss of bowels, urinary retention or incontinence. Will get CXR to r/o pneumonia, UR to rule out infection. Pain medications ordered.  Results for orders placed during the hospital encounter of 05/08/11  URINALYSIS, ROUTINE W REFLEX MICROSCOPIC      Component Value Range   Color, Urine YELLOW  YELLOW    APPearance TURBID (*) CLEAR    Specific Gravity, Urine 1.017  1.005 - 1.030    pH 7.5  5.0 - 8.0    Glucose, UA NEGATIVE  NEGATIVE (mg/dL)   Hgb urine dipstick NEGATIVE  NEGATIVE    Bilirubin Urine NEGATIVE  NEGATIVE    Ketones, ur NEGATIVE  NEGATIVE (mg/dL)   Protein, ur NEGATIVE  NEGATIVE (mg/dL)   Urobilinogen, UA 1.0  0.0 - 1.0 (mg/dL)   Nitrite NEGATIVE  NEGATIVE    Leukocytes, UA NEGATIVE  NEGATIVE   URINE MICROSCOPIC-ADD ON      Component Value Range   Squamous Epithelial / LPF RARE  RARE    Urine-Other AMORPHOUS URATES/PHOSPHATES     Dg Chest 2 View  05/08/2011  *RADIOLOGY REPORT*  Clinical Data: Shortness of breath and back pain  CHEST - 2 VIEW  Comparison: September 11, 2010  Findings: Mild cardiomegaly is stable.  Plate-like atelectasis within both lower lungs has not changed.  The mediastinum and pulmonary vasculature are within normal limits.  There are no focal infiltrates or effusions.  There is disc space narrowing and slight listhesis at mid thoracic level, probably T6-T7 which is unchanged.  IMPRESSION: Mild, stable cardiomegaly and bilateral lower lung atelectasis. There is no evidence of acute cardiac or pulmonary process.  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  If there is clinical concern regarding herniated disc or canal narrowing, MRI may be of help.  Original Report Authenticated By: Brandon Melnick, M.D.   Dg Lumbar Spine  Complete  05/08/2011  *RADIOLOGY REPORT*  Clinical Data: Chronic back pain  LUMBAR SPINE - COMPLETE 4+ VIEW  Comparison: October 17, 2003  Findings: The pedicles and spinous processes are intact at all levels.  The AP and lateral lumbar alignment are within normal limits.  There is disc space narrowing and vacuum disc at L5-S1, unchanged.  Anterior osteophytosis is also present at this level. The oblique views reveal no pars interarticularis defects but there is facet joint sclerosis at L4-L5 and L5-S1 bilaterally.  IMPRESSION: Degenerative disc disease at L5-S1 does not appear significantly changed.  If there is clinical concern regarding herniated disc, MRI may be of help.  Original Report  Authenticated By: Brandon Melnick, M.D.    10:10 AM Pt reassessed. States pain is easing off. Here VS all within normal. Will d/c home with follow up.  1. Lumbar radiculopathy   2. URI (upper respiratory infection)     Medical screening examination/treatment/procedure(s) were performed by non-physician practitioner and as supervising physician I was immediately available for consultation/collaboration. Pt has pain in neck and back, which seemed to get worse when she developed cold symptoms.  X-rays show degenerative changes in her chest and lumbar region.  She is going to have cervical MRI tomorrow and followup with Marikay Alar, M.D., her neurosurgeon, next Monday, 3 days from now.  Osvaldo Human, M.D.   MDM          Lottie Mussel, [place] 05/08/11 1012  Carleene Cooper III, MD 05/08/11 [phone]

## 2011-05-09 ENCOUNTER — Ambulatory Visit
Admission: RE | Admit: 2011-05-09 | Discharge: 2011-05-09 | Disposition: A | Discharge status: Home / Self Care | Payer: Medicaid Other | Source: Ambulatory Visit | Attending: Neurological Surgery | Admitting: Neurological Surgery

## 2011-05-09 DIAGNOSIS — M542 Cervicalgia: Secondary | ICD-10-CM

## 2011-05-13 ENCOUNTER — Ambulatory Visit: Payer: Medicaid Other | Attending: Physical Medicine and Rehabilitation | Admitting: Physical Therapy

## 2011-05-13 DIAGNOSIS — M25559 Pain in unspecified hip: Secondary | ICD-10-CM | POA: Insufficient documentation

## 2011-05-13 DIAGNOSIS — IMO0001 Reserved for inherently not codable concepts without codable children: Secondary | ICD-10-CM | POA: Insufficient documentation

## 2011-05-13 DIAGNOSIS — M545 Low back pain, unspecified: Secondary | ICD-10-CM | POA: Insufficient documentation

## 2011-05-20 ENCOUNTER — Ambulatory Visit: Payer: Medicaid Other | Admitting: Physical Therapy

## 2011-05-27 ENCOUNTER — Ambulatory Visit: Payer: Medicaid Other | Admitting: Physical Therapy

## 2011-05-29 ENCOUNTER — Ambulatory Visit: Payer: Medicaid Other | Admitting: Rehabilitation

## 2011-06-03 ENCOUNTER — Ambulatory Visit: Payer: Medicaid Other | Admitting: Rehabilitation

## 2011-06-22 ENCOUNTER — Emergency Department (HOSPITAL_COMMUNITY)
Admission: EM | Admit: 2011-06-22 | Discharge: 2011-06-22 | Disposition: A | Discharge status: Home / Self Care | Payer: Medicaid Other | Attending: Emergency Medicine | Admitting: Emergency Medicine

## 2011-06-22 ENCOUNTER — Encounter (HOSPITAL_COMMUNITY): Payer: Self-pay

## 2011-06-22 DIAGNOSIS — I10 Essential (primary) hypertension: Secondary | ICD-10-CM | POA: Insufficient documentation

## 2011-06-22 DIAGNOSIS — K219 Gastro-esophageal reflux disease without esophagitis: Secondary | ICD-10-CM | POA: Insufficient documentation

## 2011-06-22 DIAGNOSIS — Z79899 Other long term (current) drug therapy: Secondary | ICD-10-CM | POA: Insufficient documentation

## 2011-06-22 DIAGNOSIS — F17200 Nicotine dependence, unspecified, uncomplicated: Secondary | ICD-10-CM | POA: Insufficient documentation

## 2011-06-22 DIAGNOSIS — G8929 Other chronic pain: Secondary | ICD-10-CM | POA: Insufficient documentation

## 2011-06-22 DIAGNOSIS — M1990 Unspecified osteoarthritis, unspecified site: Secondary | ICD-10-CM | POA: Insufficient documentation

## 2011-06-22 DIAGNOSIS — E7800 Pure hypercholesterolemia, unspecified: Secondary | ICD-10-CM | POA: Insufficient documentation

## 2011-06-22 DIAGNOSIS — F319 Bipolar disorder, unspecified: Secondary | ICD-10-CM | POA: Insufficient documentation

## 2011-06-22 DIAGNOSIS — M5430 Sciatica, unspecified side: Secondary | ICD-10-CM | POA: Insufficient documentation

## 2011-06-22 MED ORDER — OXYCODONE-ACETAMINOPHEN 5-325 MG PO TABS: 1.0000 | ORAL_TABLET | Freq: Four times a day (QID) | ORAL | Status: AC | PRN

## 2011-06-22 NOTE — ED Notes (Signed)
Needs to have a nerve burnt in her back has tens unit, pt sts that woke up today with severe pain and now radiating to her legs.

## 2011-06-22 NOTE — Discharge Instructions (Signed)

## 2011-06-22 NOTE — ED Provider Notes (Signed)
History     CSN: [id number]  Arrival date & time 06/22/11  1347   First MD Initiated Contact with Patient 06/22/11 1700      Chief Complaint  Patient presents with  . Sciatica    (Consider location/radiation/quality/duration/timing/severity/associated sxs/prior treatment) Patient is a 56 y.o. female presenting with back pain.  Back Pain  This is a chronic problem. The current episode started 6 to 12 hours ago. The problem occurs constantly. The problem has been gradually worsening. The pain is associated with no known injury. The pain is present in the lumbar spine. The quality of the pain is described as stabbing. The pain radiates to the right thigh. The pain is severe. The pain is worse during the day. Stiffness is present in the morning. Pertinent negatives include no numbness, no bowel incontinence, no perianal numbness, no bladder incontinence and no paresthesias. She has tried analgesics for the symptoms. The treatment provided no relief.  Patient with history of chronic back pain.  Is scheduled for a procedure in the office with Dr. Modesto Charon tomorrow.  Past Medical History  Diagnosis Date  . HTN (hypertension)   . Bipolar 1 disorder   . OA (osteoarthritis)   . GERD (gastroesophageal reflux disease)   . Hypercholesterolemia   . Fever blister   . HA (headache)   . Colon polyp   . CTS (carpal tunnel syndrome)     Past Surgical History  Procedure Date  . Neck surgery 2009  . Carpal tunnel release 20110 rt/lt  . Polp removed 2011    Family History  Problem Relation Age of Onset  . Coronary artery disease Father   . Hypertension Mother   . Diabetes Mother   . Schizophrenia Mother   . Depression Brother   . Prostate cancer Brother     History  Substance Use Topics  . Smoking status: Current Everyday Smoker  . Smokeless tobacco: Not on file  . Alcohol Use: No    OB History    Grav Para Term Preterm Abortions TAB SAB Ect Mult Living                  Review of  Systems  Gastrointestinal: Negative for bowel incontinence.  Genitourinary: Negative for bladder incontinence.  Musculoskeletal: Positive for back pain.  Neurological: Negative for numbness and paresthesias.  All other systems reviewed and are negative.    Allergies  Amoxicillin; Chantix; Effexor; Hydrocodone; Paxil; and Penicillins  Home Medications   Current Outpatient Rx  Name Route Sig Dispense Refill  . CYCLOBENZAPRINE HCL 10 MG PO TABS Oral Take 10 mg by mouth 3 (three) times daily as needed. For muscle spasms    . HYDROCODONE-ACETAMINOPHEN 5-325 MG PO TABS Oral Take 1 tablet by mouth 4 (four) times daily.    . MULTI-VITAMIN/MINERALS PO TABS Oral Take 1 tablet by mouth daily.      Marland Kitchen OMEPRAZOLE 40 MG PO CPDR Oral Take 40 mg by mouth daily.    Marland Kitchen PRAVASTATIN SODIUM 40 MG PO TABS Oral Take 40 mg by mouth at bedtime.    Marland Kitchen QUETIAPINE FUMARATE ER 300 MG PO TB24 Oral Take 300 mg by mouth at bedtime.    Marland Kitchen QUETIAPINE FUMARATE 100 MG PO TABS Oral Take 100 mg by mouth daily. 300 mg nightly, 100 mg as needed    . SERTRALINE HCL 100 MG PO TABS Oral Take 100 mg by mouth daily.    . TRIAMTERENE-HCTZ 37.5-25 MG PO TABS Oral Take 1 tablet by  mouth daily.      Marland Kitchen VITAMIN B-12 100 MCG PO TABS Oral Take 100 mcg by mouth daily.     Marland Kitchen ZONISAMIDE 100 MG PO CAPS Oral Take 100 mg by mouth daily.      BP 116/82  Pulse 86  Temp 97.8 F (36.6 C)  Resp 20  SpO2 97%  Physical Exam  Nursing note and vitals reviewed. Constitutional: She is oriented to person, place, and time. She appears well-developed and well-nourished.  HENT:  Head: Normocephalic and atraumatic.  Cardiovascular: Normal rate, regular rhythm, normal heart sounds and intact distal pulses.   Pulmonary/Chest: Effort normal and breath sounds normal.  Abdominal: Soft. Bowel sounds are normal.  Musculoskeletal: Normal range of motion.  Neurological: She is alert and oriented to person, place, and time.  Skin: Skin is warm and dry.    Psychiatric: She has a normal mood and affect. Her behavior is normal. Judgment and thought content normal.    ED Course  Procedures (including critical care time)  Labs Reviewed - No data to display No results found.   No diagnosis found.  Chronic back pain Sciatica  MDM          Jimmye Norman, NP 06/22/11 2051

## 2011-06-23 NOTE — ED Provider Notes (Signed)
Medical screening examination/treatment/procedure(s) were performed by non-physician practitioner and as supervising physician I was immediately available for consultation/collaboration.  Kasyn Stouffer, MD 06/23/11 1108 

## 2011-07-25 ENCOUNTER — Emergency Department (HOSPITAL_COMMUNITY)
Admission: EM | Admit: 2011-07-25 | Discharge: 2011-07-25 | Disposition: A | Discharge status: Home / Self Care | Payer: Medicaid Other | Attending: Emergency Medicine | Admitting: Emergency Medicine

## 2011-07-25 ENCOUNTER — Encounter (HOSPITAL_COMMUNITY): Payer: Self-pay | Admitting: Emergency Medicine

## 2011-07-25 DIAGNOSIS — R059 Cough, unspecified: Secondary | ICD-10-CM | POA: Insufficient documentation

## 2011-07-25 DIAGNOSIS — R070 Pain in throat: Secondary | ICD-10-CM | POA: Insufficient documentation

## 2011-07-25 DIAGNOSIS — B370 Candidal stomatitis: Secondary | ICD-10-CM

## 2011-07-25 DIAGNOSIS — R05 Cough: Secondary | ICD-10-CM | POA: Insufficient documentation

## 2011-07-25 DIAGNOSIS — I10 Essential (primary) hypertension: Secondary | ICD-10-CM | POA: Insufficient documentation

## 2011-07-25 DIAGNOSIS — R51 Headache: Secondary | ICD-10-CM | POA: Insufficient documentation

## 2011-07-25 DIAGNOSIS — F319 Bipolar disorder, unspecified: Secondary | ICD-10-CM | POA: Insufficient documentation

## 2011-07-25 DIAGNOSIS — K219 Gastro-esophageal reflux disease without esophagitis: Secondary | ICD-10-CM | POA: Insufficient documentation

## 2011-07-25 DIAGNOSIS — F17200 Nicotine dependence, unspecified, uncomplicated: Secondary | ICD-10-CM | POA: Insufficient documentation

## 2011-07-25 HISTORY — DX: Schizoaffective disorder, unspecified: F25.9

## 2011-07-25 MED ORDER — MAGIC MOUTHWASH: 5.0000 mL | Freq: Four times a day (QID) | ORAL | Status: DC | PRN

## 2011-07-25 NOTE — Discharge Instructions (Signed)
Thrush, Adult  Gloria Lewis is a yeast infection that develops in the mouth and throat and on the tongue. The medical term for this is oropharyngeal candidiasis, or OPC. Gloria Lewis is most common in older adults, but it can occur at any age. Gloria Lewis occurs when a yeast called candida grows out of control. Candida normally is present in small amounts in the mouth and on other mucous membranes. However, under certain circumstances, candida can grow rapidly, causing thrush. Gloria Lewis can be a recurring problem for people who have chronic illnesses or who take medications that limit the body's ability to fight infection (weakened immune system). Since these people have difficulty fighting infections, the fungus that causes thrush can spread throughout the body. This can cause life-threatening blood or organ infections. CAUSES  Candida, the yeast that causes thrush, is normally present in small amounts in the mouth and on other mucous membranes. It usually causes no harm. However, when conditions are present that allow the yeast to grow uncontrolled, it invades surrounding tissues and becomes an infection. Gloria Lewis is most commonly caused by the yeast Candida albicans. Less often, other forms of candida can lead to thrush. There are many types of bacteria in your mouth that normally control the growth of candida. Sometimes a new type of bacteria gets into your mouth and disrupts the balance of the germs already there. This can allow candida to overgrow. Other factors that increase your risk of developing thrush include:  An impaired ability to fight infection (weakened immune system). A normal immune system is usually strong enough to prevent candida from overgrowing.   Older adults are more likely to develop thrush because they may have weaker immune systems.   People with human immunodeficiency virus (HIV) infection have a high likelihood of developing thrush. About 90% of people with HIV develop thrush at some point during  the course of their disease.   People with diabetes are more likely to get thrush because high blood sugar levels promote overgrowth of the candida fungus.   A dry mouth (xerostomia). Dry mouth can result from overuse of mouthwashes or from certain conditions such as Sjgren's syndrome.   Pregnancy. Hormone changes during pregnancy can lead to thrush by altering the balance of bacteria in the mouth.   Poor dental care, especially in people who have false teeth.   The use of antibiotic medications. This may lead to thrush by changing the balance of bacteria in the mouth.  SYMPTOMS  Thrush can be a mild infection that causes no symptoms. If symptoms develop, they may include the following:  A burning feeling in the mouth and throat. This can occur at the start of a thrush infection.   White patches that adhere to the mouth and tongue. The tissue around the patches may be red, raw, and painful. If rubbed (during tooth brushing, for example), the patches and the tissue of the mouth may bleed easily.   A bad taste in the mouth or difficulty tasting foods.   Cottony feeling in the mouth.   Sometimes pain during eating and swallowing.  DIAGNOSIS  Your caregiver can usually diagnose thrush by exam. In addition to looking in your mouth, your caregiver will ask you questions about your health. TREATMENT  Medications that help prevent the growth of fungi (antifungals) are the standard treatment for thrush. These medications are either applied directly to the affected area (topical) or swallowed (oral). Mild thrush In adults, mild cases of thrush may clear up with simple treatment that  can be done at home. This treatment usually involves using an antifungal mouth rinse or lozenges. Treatment usually lasts about 14 days. Moderate to severe thrush  More severe thrush infections that have spread to the esophagus are treated with an oral antifungal medication. A topical antifungal medication may also  be used.   For some severe infections, a treatment period longer than 14 days may be needed.   Oral antifungal medications are almost never used during pregnancy because the fetus may be harmed. However, if a pregnant woman has a rare, severe thrush infection that has spread to her blood, oral antifungal medications may be used. In this case, the risk of harm to the mother and fetus from the severe thrush infection may be greater than the risk posed by the use of antifungal medications.  Persistent or recurrent thrush Persistent (does not go away) or recurrent (keeps coming back) cases of thrush may:  Need to be treated twice as long as the symptoms last.   Require treatment with both oral and topical antifungal medications.   People with weakened immune systems can take an antifungal medication on a continuous basis to prevent thrush infections.  It is important to treat conditions that make you more likely to get thrush, such as diabetes, human immunodeficiency virus (HIV), or cancer.  HOME CARE INSTRUCTIONS   If you are breast-feeding, you should clean your nipples with an antifungal medication, such as nystatin (Mycostatin). Dry your nipples after breast-feeding. Applying lanolin-containing body lotion may help relieve nipple soreness.   If you wear dentures and get thrush, remove dentures before going to bed, brush them vigorously, and soak in a solution of chlorhexidine gluconate or a product such as Polident or Efferdent.   Eating plain, unflavored yogurt that contains live cultures (check the label) can also help cure thrush. Yogurt helps healthy bacteria grow in the mouth. These bacteria stop the growth of the yeast that causes thrush.   Adults can treat thrush at home with gentian violet (1%), a dye that kills bacteria and fungi. It is available without a prescription. If there is no known cause for the infection or if gentian violet does not cure the thrush, you need to see your  caregiver.  Comfort measures Measures can be taken to reduce the discomfort of thrush:  Drink cold liquids such as water or iced tea. Eat flavored ice treats or frozen juices.   Eat foods that are easy to swallow such as gelatin, ice cream, or custard.   If the patches are painful, try drinking from a straw.   Rinse your mouth several times a day with a warm saltwater rinse. You can make the saltwater mixture with 1 tsp (5 g) of salt in 8 fl oz (0.2 L) of warm water.  PROGNOSIS   Most cases of thrush are mild and clear up with the use of an antifungal mouth rinse or lozenges. Very mild cases of thrush may clear up without medical treatment. It usually takes about 14 days of treatment with an oral antifungal medication to cure more severe thrush infections. In some cases, thrush may last several weeks even with treatment.   If thrush goes untreated and does not go away by itself, it can spread to other parts of the body.   Thrush can spread to the throat, the vagina, or the skin. It rarely spreads to other organs of the body.  Gloria Lewis is more likely to recur (come back) in:  People who use inhaled  to other organs of the body.  Thrush is more likely to recur (come back) in:   People who use inhaled corticosteroids to treat asthma.   People who take antibiotic medications for a long time.   People who have false teeth.   People who have a weakened immune system.  RISKS AND COMPLICATIONS  Complications related to thrush are rare in healthy people.  There are several factors that can increase your risk of developing thrush.  Age  Older adults, especially those who have serious health problems, are more likely to develop thrush because their immune systems are likely to be weaker.  Behavior   The yeast that causes thrush can be spread by oral sex.   Heavy smoking can lower the body's ability to fight off infections. This makes thrush more likely to develop.  Other conditions   False teeth (dentures), braces, or a retainer that irritates the mouth make it hard to keep the mouth clean. An unclean mouth is  more likely to develop thrush than a clean mouth.   People with a weakened immune system, such as those who have diabetes or human immunodeficiency virus (HIV) or who are undergoing chemotherapy, have an increased risk for developing thrush.  Medications  Some medications can allow the fungus that causes thrush to grow uncontrolled. Common ones are:   Antibiotics, especially those that kill a wide range of organisms (broad-spectrum antibiotics), such as tetracycline commonly can cause thrush.   Birth control pills (oral contraceptives).   Medications that weaken the body's immune system, such as corticosteroids.  Environment  Exposure over time to certain environmental chemicals, such as benzene and pesticides, can weaken the body's immune system. This increases your risk for developing infections, including thrush.  SEEK IMMEDIATE MEDICAL CARE IF:   Your symptoms are getting worse or are not improving within 7 days of starting treatment.   You have symptoms of spreading infection, such as white patches on the skin outside of the mouth.   You are nursing and you have redness and pain in the nipples in spite of home treatment or if you have burning pain in the nipple area when you nurse. Your baby's mouth should also be examined to determine whether thrush is causing your symptoms.  Document Released: 12/17/2003 Document Revised: 03/12/2011 Document Reviewed: 03/28/2008  [employer] Patient Information 2012 [employer]

## 2011-07-25 NOTE — ED Provider Notes (Signed)
History     CSN: [id number]  Arrival date & time 07/25/11  4098   First MD Initiated Contact with Patient 07/25/11 [phone]      Chief Complaint  Patient presents with  . Sore Throat  . Thrush    (Consider location/radiation/quality/duration/timing/severity/associated sxs/prior treatment) Patient is a 56 y.o. female presenting with pharyngitis. The history is provided by the patient.  Sore Throat This is a new problem. The current episode started yesterday. The problem occurs constantly. The problem has been gradually worsening. Associated symptoms include coughing, headaches and a sore throat. Pertinent negatives include no abdominal pain, anorexia, arthralgias, change in bowel habit, chest pain, congestion, diaphoresis, fatigue, fever, joint swelling, myalgias, nausea, neck pain, numbness, rash, swollen glands, urinary symptoms, vertigo, visual change, vomiting or weakness. Associated symptoms comments: She does have a white tongue.. The symptoms are aggravated by nothing.    Past Medical History  Diagnosis Date  . HTN (hypertension)   . Bipolar 1 disorder   . OA (osteoarthritis)   . GERD (gastroesophageal reflux disease)   . Hypercholesterolemia   . Fever blister   . HA (headache)   . Colon polyp   . CTS (carpal tunnel syndrome)   . Schizo-affective psychosis     Past Surgical History  Procedure Date  . Neck surgery 2009  . Carpal tunnel release 20110 rt/lt  . Polp removed 2011    Family History  Problem Relation Age of Onset  . Coronary artery disease Father   . Hypertension Mother   . Diabetes Mother   . Schizophrenia Mother   . Depression Brother   . Prostate cancer Brother     History  Substance Use Topics  . Smoking status: Current Everyday Smoker  . Smokeless tobacco: Not on file  . Alcohol Use: No    OB History    Grav Para Term Preterm Abortions TAB SAB Ect Mult Living                  Review of Systems  Constitutional: Negative for fever,  diaphoresis and fatigue.  HENT: Positive for sore throat. Negative for congestion and neck pain.   Respiratory: Positive for cough.   Cardiovascular: Negative for chest pain.  Gastrointestinal: Negative for nausea, vomiting, abdominal pain, anorexia and change in bowel habit.  Musculoskeletal: Negative for myalgias, joint swelling and arthralgias.  Skin: Negative for rash.  Neurological: Positive for headaches. Negative for vertigo, weakness and numbness.  All other systems reviewed and are negative.    Allergies  Amoxicillin; Chantix; Effexor; Paxil; Penicillins; Zithromax; and Hydrocodone  Home Medications   Current Outpatient Rx  Name Route Sig Dispense Refill  . CYCLOBENZAPRINE HCL 10 MG PO TABS Oral Take 10 mg by mouth 3 (three) times daily as needed. For muscle spasms    . HYDROCODONE-ACETAMINOPHEN 5-325 MG PO TABS Oral Take 1 tablet by mouth 4 (four) times daily.    Marland Kitchen MENTHOL (TOPICAL ANALGESIC) 5 % EX PADS Apply externally Apply 1 patch topically daily as needed. For back pain.    Marland Kitchen MULTI-VITAMIN/MINERALS PO TABS Oral Take 1 tablet by mouth daily.      Marland Kitchen OMEPRAZOLE 40 MG PO CPDR Oral Take 80 mg by mouth daily.     Marland Kitchen PRAVASTATIN SODIUM 40 MG PO TABS Oral Take 40 mg by mouth at bedtime.    Marland Kitchen QUETIAPINE FUMARATE ER 300 MG PO TB24 Oral Take 300 mg by mouth at bedtime.    Marland Kitchen QUETIAPINE FUMARATE 100 MG PO TABS  Oral Take 100 mg by mouth daily.     . SERTRALINE HCL 100 MG PO TABS Oral Take 100 mg by mouth daily.    . TRIAMTERENE-HCTZ 37.5-25 MG PO TABS Oral Take 1 tablet by mouth daily.      Marland Kitchen VITAMIN B-12 100 MCG PO TABS Oral Take 100 mcg by mouth daily.     Marland Kitchen ZONISAMIDE 100 MG PO CAPS Oral Take 100 mg by mouth daily.    Marland Kitchen MAGIC MOUTHWASH Oral Take 5 mLs by mouth every 6 (six) hours as needed. 100 mL 0    BP 152/89  Pulse 77  Temp(Src) 97.7 F (36.5 C) (Oral)  Resp 18  SpO2 94%  Physical Exam  Nursing note and vitals reviewed. Constitutional: She is oriented to person,  place, and time. She appears well-developed and well-nourished. No distress.  HENT:  Head: Normocephalic and atraumatic.  Right Ear: External ear normal.  Left Ear: External ear normal.  Nose: Nose normal.  Mouth/Throat: Oropharynx is clear and moist. No oropharyngeal exudate, posterior oropharyngeal edema, posterior oropharyngeal erythema or tonsillar abscesses.    Neck: Normal range of motion. Neck supple.  Cardiovascular: Normal rate, regular rhythm and normal heart sounds.   Pulmonary/Chest: Effort normal and breath sounds normal. She has no wheezes.  Abdominal: Soft. Bowel sounds are normal. She exhibits no distension.  Musculoskeletal: Normal range of motion.  Lymphadenopathy:    She has no cervical adenopathy.  Neurological: She is alert and oriented to person, place, and time.  Skin: Skin is warm and dry. She is not diaphoretic. No erythema.  Psychiatric: She has a normal mood and affect. Her behavior is normal.    ED Course  Procedures (including critical care time)   Labs Reviewed  RAPID STREP SCREEN   No results found.   1. Thrush       MDM  Ordered Rapid Strep which was negative.  Given Magic Mouthwash.  If sx persist, return to ED or f/u with campus physician.  Pt voices compliance and understanding.        Lindley Magnus [place], [place] 07/25/11 [phone]

## 2011-07-25 NOTE — ED Provider Notes (Signed)
Medical screening examination/treatment/procedure(s) were performed by non-physician practitioner and as supervising physician I was immediately available for consultation/collaboration.   Bently Wyss, MD 07/25/11 1035 

## 2011-07-25 NOTE — ED Notes (Signed)
Pt c/o sore throat on Tuesday, seen by campus Dr and azithromycin. Wednesday pt noticed thrush in mouth.

## 2011-08-11 ENCOUNTER — Other Ambulatory Visit (HOSPITAL_COMMUNITY): Payer: Self-pay | Admitting: Family Medicine

## 2011-08-11 DIAGNOSIS — Z1231 Encounter for screening mammogram for malignant neoplasm of breast: Secondary | ICD-10-CM

## 2011-08-12 ENCOUNTER — Encounter (HOSPITAL_COMMUNITY): Payer: Self-pay

## 2011-08-12 ENCOUNTER — Emergency Department (INDEPENDENT_AMBULATORY_CARE_PROVIDER_SITE_OTHER)
Admission: EM | Admit: 2011-08-12 | Discharge: 2011-08-12 | Disposition: A | Discharge status: Home / Self Care | Payer: Medicaid Other | Source: Home / Self Care | Attending: Family Medicine | Admitting: Family Medicine

## 2011-08-12 DIAGNOSIS — M76899 Other specified enthesopathies of unspecified lower limb, excluding foot: Secondary | ICD-10-CM

## 2011-08-12 DIAGNOSIS — S96912A Strain of unspecified muscle and tendon at ankle and foot level, left foot, initial encounter: Secondary | ICD-10-CM

## 2011-08-12 DIAGNOSIS — M7061 Trochanteric bursitis, right hip: Secondary | ICD-10-CM

## 2011-08-12 MED ORDER — HYDROCODONE-ACETAMINOPHEN 7.5-325 MG PO TABS: 1.0000 | ORAL_TABLET | Freq: Four times a day (QID) | ORAL | Status: AC | PRN

## 2011-08-12 NOTE — Discharge Instructions (Signed)
Use ice on hip and medicine as needed, see your doctor for hip injection.

## 2011-08-12 NOTE — ED Provider Notes (Signed)
History     CSN: [id number]  Arrival date & time 08/12/11  1542   First MD Initiated Contact with Patient 08/12/11 1608      Chief Complaint  Patient presents with  . Hip Pain  . Foot Pain    (Consider location/radiation/quality/duration/timing/severity/associated sxs/prior treatment) Patient is a 56 y.o. female presenting with hip pain and lower extremity pain. The history is provided by the patient.  Hip Pain This is a new problem. The current episode started more than 2 days ago (has tens unit on upper back, has had mult injections in back, sses mult docs for pain issues.). The problem has been gradually worsening. Pertinent negatives include no chest pain and no abdominal pain. The symptoms are aggravated by walking.  Foot Pain Pertinent negatives include no chest pain and no abdominal pain.    Past Medical History  Diagnosis Date  . HTN (hypertension)   . Bipolar 1 disorder   . OA (osteoarthritis)   . GERD (gastroesophageal reflux disease)   . Hypercholesterolemia   . Fever blister   . HA (headache)   . Colon polyp   . CTS (carpal tunnel syndrome)   . Schizo-affective psychosis     Past Surgical History  Procedure Date  . Neck surgery 2009  . Carpal tunnel release 20110 rt/lt  . Polp removed 2011    Family History  Problem Relation Age of Onset  . Coronary artery disease Father   . Hypertension Mother   . Diabetes Mother   . Schizophrenia Mother   . Depression Brother   . Prostate cancer Brother     History  Substance Use Topics  . Smoking status: Current Everyday Smoker  . Smokeless tobacco: Not on file  . Alcohol Use: No    OB History    Grav Para Term Preterm Abortions TAB SAB Ect Mult Living                  Review of Systems  Constitutional: Negative.   Cardiovascular: Negative for chest pain.  Gastrointestinal: Negative.  Negative for abdominal pain.  Musculoskeletal: Positive for gait problem.    Allergies  Amoxicillin; Chantix;  Effexor; Paroxetine hcl; Penicillins; Zithromax; and Hydrocodone  Home Medications   Current Outpatient Rx  Name Route Sig Dispense Refill  . OMEPRAZOLE 40 MG PO CPDR Oral Take 80 mg by mouth daily.     Marland Kitchen POTASSIUM CHLORIDE ER PO Oral Take by mouth.    Marland Kitchen PRAVASTATIN SODIUM 40 MG PO TABS Oral Take 40 mg by mouth at bedtime.    Marland Kitchen QUETIAPINE FUMARATE ER 300 MG PO TB24 Oral Take 300 mg by mouth at bedtime.    . SERTRALINE HCL 100 MG PO TABS Oral Take 100 mg by mouth daily.    . TRIAMTERENE-HCTZ 37.5-25 MG PO TABS Oral Take 1 tablet by mouth daily.      Marland Kitchen VITAMIN B-12 100 MCG PO TABS Oral Take 100 mcg by mouth daily.     Marland Kitchen ZONISAMIDE 100 MG PO CAPS Oral Take 100 mg by mouth daily.    Marland Kitchen MAGIC MOUTHWASH Oral Take 5 mLs by mouth every 6 (six) hours as needed. 100 mL 0  . CYCLOBENZAPRINE HCL 10 MG PO TABS Oral Take 10 mg by mouth 3 (three) times daily as needed. For muscle spasms    . HYDROCODONE-ACETAMINOPHEN 5-325 MG PO TABS Oral Take 1 tablet by mouth 4 (four) times daily.    Marland Kitchen HYDROCODONE-ACETAMINOPHEN 7.5-325 MG PO TABS Oral Take  1 tablet by mouth every 6 (six) hours as needed for pain. 20 tablet 0  . MENTHOL (TOPICAL ANALGESIC) 5 % EX PADS Apply externally Apply 1 patch topically daily as needed. For back pain.    Marland Kitchen MULTI-VITAMIN/MINERALS PO TABS Oral Take 1 tablet by mouth daily.      . QUETIAPINE FUMARATE 100 MG PO TABS Oral Take 100 mg by mouth daily.       BP 115/78  Pulse 97  Temp(Src) 98.2 F (36.8 C) (Oral)  Resp 20  SpO2 95%  Physical Exam  Nursing note and vitals reviewed. Constitutional: She is oriented to person, place, and time. She appears well-developed and well-nourished.  Abdominal: Soft. Bowel sounds are normal.  Musculoskeletal: She exhibits tenderness.       Acute tenderness over right gt troch of hip reproducing sx, vague soreness to left foot , no sts or visible trauma, nvt intact.  Neurological: She is alert and oriented to person, place, and time.  Skin: Skin  is warm and dry.    ED Course  Procedures (including critical care time)  Labs Reviewed - No data to display No results found.   1. Trochanteric bursitis of right hip   2. Strain of left foot, initial encounter       MDM          Linna Hoff, MD 08/12/11 1640

## 2011-08-12 NOTE — ED Notes (Signed)
C/o pain in rt hip since Monday- states she saw her PCP on Monday for this and they told her to follow up in 2 weeks if no improvement.  States her lt foot began hurting and swelling on Monday night also.  Denies any fall or injury.

## 2011-08-17 ENCOUNTER — Other Ambulatory Visit: Payer: Self-pay | Admitting: Neurological Surgery

## 2011-08-21 ENCOUNTER — Encounter (HOSPITAL_COMMUNITY): Payer: Self-pay | Admitting: Pharmacy Technician

## 2011-08-24 ENCOUNTER — Encounter (HOSPITAL_COMMUNITY): Payer: Self-pay

## 2011-08-24 ENCOUNTER — Inpatient Hospital Stay (HOSPITAL_COMMUNITY): Admission: RE | Admit: 2011-08-24 | Discharge: 2011-08-24 | Payer: Medicaid Other | Source: Ambulatory Visit

## 2011-08-24 HISTORY — DX: Anxiety disorder, unspecified: F41.9

## 2011-08-24 MED ORDER — SODIUM CHLORIDE 0.9 % IV SOLN
INTRAVENOUS | Status: AC
  Filled 2011-08-24: qty 500

## 2011-08-24 MED ORDER — BACITRACIN 50000 UNITS IM SOLR
INTRAMUSCULAR | Status: AC
  Filled 2011-08-24: qty 1

## 2011-08-24 NOTE — Pre-Procedure Instructions (Addendum)
20 Gloria Lewis  08/24/2011   Your procedure is scheduled on:  Thursday Aug 27, 2011.  Report to [facility] at 0930 AM.  Call this number if you have problems the morning of surgery: [phone]   Remember:   Do not eat food:After Midnight.  May have clear liquids: up to 4 Hours before arrival until 0520 am.  Clear liquids include soda, tea, black coffee, apple or grape juice, broth.  Take these medicines the morning of surgery with A SIP OF WATER: Hydrocodone (Norco) if needed for pain, Omeprazole (Prilosec), Quetiapine (Seroquel), and Sertraline (Zoloft).   Do not wear jewelry, make-up or nail polish.  Do not wear lotions, powders, or perfumes. You may wear deodorant.  Do not shave 48 hours prior to surgery. Men may shave face and neck.  Do not bring valuables to the hospital.  Contacts, dentures or bridgework may not be worn into surgery.  Leave suitcase in the car. After surgery it may be brought to your room.  For patients admitted to the hospital, checkout time is 11:00 AM the day of discharge.   Patients discharged the day of surgery will not be allowed to drive home.  Name and phone number of your driver:   Special Instructions: CHG Shower Use Special Wash: 1/2 bottle night before surgery and 1/2 bottle morning of surgery.   Please read over the following fact sheets that you were given: Pain Booklet, Coughing and Deep Breathing, MRSA Information and Surgical Site Infection Prevention

## 2011-08-24 NOTE — Progress Notes (Signed)
Pt denied having a stress test or cardiac cath. Pt confirmed to having a sleep study done "somewhere on [address]" but denied having a Bipap or CPAP machine. PCP is Laurann Montana at [facility]. Will request records from PCP.

## 2011-08-25 ENCOUNTER — Encounter (HOSPITAL_COMMUNITY)
Admission: RE | Admit: 2011-08-25 | Discharge: 2011-08-25 | Disposition: A | Discharge status: Home / Self Care | Payer: Medicaid Other | Source: Ambulatory Visit | Attending: Neurological Surgery | Admitting: Neurological Surgery

## 2011-08-25 LAB — DIFFERENTIAL
Basophils Relative: 0 % (ref 0–1)
Eosinophils Absolute: 0.1 10*3/uL (ref 0.0–0.7)
Eosinophils Relative: 1 % (ref 0–5)
Lymphs Abs: 3.1 10*3/uL (ref 0.7–4.0)
Monocytes Relative: 7 % (ref 3–12)

## 2011-08-25 LAB — CBC
MCH: 30.5 pg (ref 26.0–34.0)
MCV: 87.9 fL (ref 78.0–100.0)
Platelets: 395 10*3/uL (ref 150–400)
RBC: 4.53 MIL/uL (ref 3.87–5.11)

## 2011-08-25 LAB — BASIC METABOLIC PANEL
CO2: 28 mEq/L (ref 19–32)
Calcium: 10.5 mg/dL (ref 8.4–10.5)
Glucose, Bld: 97 mg/dL (ref 70–99)
Sodium: 139 mEq/L (ref 135–145)

## 2011-08-25 LAB — PROTIME-INR: Prothrombin Time: 12.8 seconds (ref 11.6–15.2)

## 2011-08-26 MED ORDER — VANCOMYCIN HCL IN DEXTROSE 1-5 GM/200ML-% IV SOLN: 1000.0000 mg | INTRAVENOUS | Status: DC

## 2011-08-26 MED ORDER — VANCOMYCIN HCL 1000 MG IV SOLR
1500.0000 mg | INTRAVENOUS | Status: AC
  Administered 2011-08-27: 1500 mg via INTRAVENOUS
  Filled 2011-08-26: qty 1500

## 2011-08-26 NOTE — Progress Notes (Signed)
Re requested  Via fax the sleep study.

## 2011-08-27 ENCOUNTER — Encounter (HOSPITAL_COMMUNITY): Payer: Self-pay | Admitting: Anesthesiology

## 2011-08-27 ENCOUNTER — Encounter (HOSPITAL_COMMUNITY): Payer: Self-pay | Admitting: Neurological Surgery

## 2011-08-27 ENCOUNTER — Encounter (HOSPITAL_COMMUNITY)
Admission: RE | Disposition: A | Discharge status: Home / Self Care | Payer: Self-pay | Source: Ambulatory Visit | Attending: Neurological Surgery

## 2011-08-27 ENCOUNTER — Ambulatory Visit (HOSPITAL_COMMUNITY): Payer: Medicaid Other | Admitting: Anesthesiology

## 2011-08-27 ENCOUNTER — Inpatient Hospital Stay (HOSPITAL_COMMUNITY): Payer: Medicaid Other

## 2011-08-27 ENCOUNTER — Inpatient Hospital Stay (HOSPITAL_COMMUNITY)
Admission: RE | Admit: 2011-08-27 | Discharge: 2011-08-28 | DRG: 473 | Disposition: A | Discharge status: Home / Self Care | Payer: Medicaid Other | Source: Ambulatory Visit | Attending: Neurological Surgery | Admitting: Neurological Surgery

## 2011-08-27 DIAGNOSIS — Z8249 Family history of ischemic heart disease and other diseases of the circulatory system: Secondary | ICD-10-CM

## 2011-08-27 DIAGNOSIS — F259 Schizoaffective disorder, unspecified: Secondary | ICD-10-CM | POA: Diagnosis present

## 2011-08-27 DIAGNOSIS — Z981 Arthrodesis status: Secondary | ICD-10-CM

## 2011-08-27 DIAGNOSIS — Z880 Allergy status to penicillin: Secondary | ICD-10-CM

## 2011-08-27 DIAGNOSIS — Z818 Family history of other mental and behavioral disorders: Secondary | ICD-10-CM

## 2011-08-27 DIAGNOSIS — I10 Essential (primary) hypertension: Secondary | ICD-10-CM | POA: Diagnosis present

## 2011-08-27 DIAGNOSIS — F17200 Nicotine dependence, unspecified, uncomplicated: Secondary | ICD-10-CM | POA: Diagnosis present

## 2011-08-27 DIAGNOSIS — F319 Bipolar disorder, unspecified: Secondary | ICD-10-CM | POA: Diagnosis present

## 2011-08-27 DIAGNOSIS — Z833 Family history of diabetes mellitus: Secondary | ICD-10-CM

## 2011-08-27 DIAGNOSIS — K219 Gastro-esophageal reflux disease without esophagitis: Secondary | ICD-10-CM | POA: Diagnosis present

## 2011-08-27 DIAGNOSIS — M47812 Spondylosis without myelopathy or radiculopathy, cervical region: Principal | ICD-10-CM | POA: Diagnosis present

## 2011-08-27 HISTORY — PX: ANTERIOR CERVICAL DECOMP/DISCECTOMY FUSION: SHX1161

## 2011-08-27 SURGERY — ANTERIOR CERVICAL DECOMPRESSION/DISCECTOMY FUSION 1 LEVEL/HARDWARE REMOVAL
Anesthesia: General | Site: Neck | Laterality: Bilateral | Wound class: Clean

## 2011-08-27 MED ORDER — LIDOCAINE HCL 4 % MT SOLN
OROMUCOSAL | Status: DC | PRN
  Administered 2011-08-27: 4 mL via TOPICAL

## 2011-08-27 MED ORDER — ACETAMINOPHEN 325 MG PO TABS: 650.0000 mg | ORAL_TABLET | ORAL | Status: DC | PRN

## 2011-08-27 MED ORDER — ROCURONIUM BROMIDE 100 MG/10ML IV SOLN
INTRAVENOUS | Status: DC | PRN
  Administered 2011-08-27: 50 mg via INTRAVENOUS
  Administered 2011-08-27: 20 mg via INTRAVENOUS

## 2011-08-27 MED ORDER — ZOLPIDEM TARTRATE 5 MG PO TABS: 10.0000 mg | ORAL_TABLET | Freq: Every evening | ORAL | Status: DC | PRN

## 2011-08-27 MED ORDER — POTASSIUM CHLORIDE IN NACL 20-0.9 MEQ/L-% IV SOLN
INTRAVENOUS | Status: DC
  Filled 2011-08-27 (×3): qty 1000

## 2011-08-27 MED ORDER — GLYCOPYRROLATE 0.2 MG/ML IJ SOLN
INTRAMUSCULAR | Status: DC | PRN
  Administered 2011-08-27: .6 mg via INTRAVENOUS

## 2011-08-27 MED ORDER — SODIUM CHLORIDE 0.9 % IJ SOLN
3.0000 mL | Freq: Two times a day (BID) | INTRAMUSCULAR | Status: DC
  Administered 2011-08-27 – 2011-08-28 (×2): 3 mL via INTRAVENOUS

## 2011-08-27 MED ORDER — BACITRACIN 50000 UNITS IM SOLR
INTRAMUSCULAR | Status: AC
  Filled 2011-08-27: qty 1

## 2011-08-27 MED ORDER — ZONISAMIDE 100 MG PO CAPS
400.0000 mg | ORAL_CAPSULE | Freq: Every day | ORAL | Status: DC
  Filled 2011-08-27: qty 4

## 2011-08-27 MED ORDER — DEXAMETHASONE SODIUM PHOSPHATE 4 MG/ML IJ SOLN
4.0000 mg | Freq: Four times a day (QID) | INTRAMUSCULAR | Status: DC
  Filled 2011-08-27 (×4): qty 1

## 2011-08-27 MED ORDER — DIPHENHYDRAMINE HCL 25 MG PO CAPS
25.0000 mg | ORAL_CAPSULE | Freq: Four times a day (QID) | ORAL | Status: DC | PRN
  Filled 2011-08-27: qty 1

## 2011-08-27 MED ORDER — SODIUM CHLORIDE 0.9 % IV SOLN
INTRAVENOUS | Status: AC
  Filled 2011-08-27: qty 500

## 2011-08-27 MED ORDER — MIDAZOLAM HCL 5 MG/5ML IJ SOLN
INTRAMUSCULAR | Status: DC | PRN
  Administered 2011-08-27: 2 mg via INTRAVENOUS

## 2011-08-27 MED ORDER — SODIUM CHLORIDE 0.9 % IJ SOLN: 3.0000 mL | INTRAMUSCULAR | Status: DC | PRN

## 2011-08-27 MED ORDER — ZONISAMIDE 100 MG PO CAPS
100.0000 mg | ORAL_CAPSULE | Freq: Four times a day (QID) | ORAL | Status: DC
  Administered 2011-08-27: 100 mg via ORAL
  Filled 2011-08-27 (×4): qty 1

## 2011-08-27 MED ORDER — HYDROMORPHONE HCL PF 1 MG/ML IJ SOLN
0.5000 mg | INTRAMUSCULAR | Status: DC | PRN
  Administered 2011-08-27 – 2011-08-28 (×4): 1 mg via INTRAVENOUS
  Filled 2011-08-27 (×4): qty 1

## 2011-08-27 MED ORDER — BUPIVACAINE HCL (PF) 0.25 % IJ SOLN
INTRAMUSCULAR | Status: DC | PRN
  Administered 2011-08-27: 3 mL

## 2011-08-27 MED ORDER — SODIUM CHLORIDE 0.9 % IR SOLN
Status: DC | PRN
  Administered 2011-08-27: 11:00:00

## 2011-08-27 MED ORDER — QUETIAPINE FUMARATE ER 300 MG PO TB24
300.0000 mg | ORAL_TABLET | Freq: Every day | ORAL | Status: DC
  Administered 2011-08-27: 300 mg via ORAL
  Filled 2011-08-27 (×2): qty 1

## 2011-08-27 MED ORDER — NEOSTIGMINE METHYLSULFATE 1 MG/ML IJ SOLN
INTRAMUSCULAR | Status: DC | PRN
  Administered 2011-08-27: 3 mg via INTRAVENOUS

## 2011-08-27 MED ORDER — PROPOFOL 10 MG/ML IV EMUL
INTRAVENOUS | Status: DC | PRN
  Administered 2011-08-27: 200 mg via INTRAVENOUS

## 2011-08-27 MED ORDER — DEXAMETHASONE 4 MG PO TABS
4.0000 mg | ORAL_TABLET | Freq: Four times a day (QID) | ORAL | Status: DC
  Administered 2011-08-27 – 2011-08-28 (×3): 4 mg via ORAL
  Filled 2011-08-27 (×7): qty 1

## 2011-08-27 MED ORDER — ZONISAMIDE 100 MG PO CAPS
300.0000 mg | ORAL_CAPSULE | Freq: Once | ORAL | Status: AC
  Administered 2011-08-27: 300 mg via ORAL
  Filled 2011-08-27: qty 3

## 2011-08-27 MED ORDER — SERTRALINE HCL 100 MG PO TABS
100.0000 mg | ORAL_TABLET | Freq: Every day | ORAL | Status: DC
  Administered 2011-08-28: 100 mg via ORAL
  Filled 2011-08-27: qty 1

## 2011-08-27 MED ORDER — DROPERIDOL 2.5 MG/ML IJ SOLN: 0.6250 mg | INTRAMUSCULAR | Status: DC | PRN

## 2011-08-27 MED ORDER — ONDANSETRON HCL 4 MG/2ML IJ SOLN: 4.0000 mg | INTRAMUSCULAR | Status: DC | PRN

## 2011-08-27 MED ORDER — ACETAMINOPHEN 650 MG RE SUPP: 650.0000 mg | RECTAL | Status: DC | PRN

## 2011-08-27 MED ORDER — ONDANSETRON HCL 4 MG/2ML IJ SOLN
INTRAMUSCULAR | Status: DC | PRN
  Administered 2011-08-27: 4 mg via INTRAVENOUS

## 2011-08-27 MED ORDER — SENNA 8.6 MG PO TABS
1.0000 | ORAL_TABLET | Freq: Two times a day (BID) | ORAL | Status: DC
  Administered 2011-08-27 – 2011-08-28 (×2): 8.6 mg via ORAL
  Filled 2011-08-27 (×3): qty 1

## 2011-08-27 MED ORDER — HYDROMORPHONE HCL PF 1 MG/ML IJ SOLN
INTRAMUSCULAR | Status: AC
  Filled 2011-08-27: qty 1

## 2011-08-27 MED ORDER — THROMBIN 5000 UNITS EX KIT
PACK | CUTANEOUS | Status: DC | PRN
  Administered 2011-08-27 (×2): 5000 [IU] via TOPICAL

## 2011-08-27 MED ORDER — MENTHOL 3 MG MT LOZG
1.0000 | LOZENGE | OROMUCOSAL | Status: DC | PRN
  Filled 2011-08-27: qty 9

## 2011-08-27 MED ORDER — HEMOSTATIC AGENTS (NO CHARGE) OPTIME
TOPICAL | Status: DC | PRN
  Administered 2011-08-27: 1 via TOPICAL

## 2011-08-27 MED ORDER — CYCLOBENZAPRINE HCL 10 MG PO TABS
10.0000 mg | ORAL_TABLET | Freq: Three times a day (TID) | ORAL | Status: DC | PRN
  Administered 2011-08-27 – 2011-08-28 (×2): 10 mg via ORAL
  Filled 2011-08-27 (×2): qty 1

## 2011-08-27 MED ORDER — FENTANYL CITRATE 0.05 MG/ML IJ SOLN
INTRAMUSCULAR | Status: DC | PRN
  Administered 2011-08-27 (×2): 50 ug via INTRAVENOUS
  Administered 2011-08-27: 150 ug via INTRAVENOUS

## 2011-08-27 MED ORDER — HYDROMORPHONE HCL PF 1 MG/ML IJ SOLN
0.2500 mg | INTRAMUSCULAR | Status: DC | PRN
  Administered 2011-08-27 (×2): 0.5 mg via INTRAVENOUS

## 2011-08-27 MED ORDER — PHENOL 1.4 % MT LIQD: 1.0000 | OROMUCOSAL | Status: DC | PRN

## 2011-08-27 MED ORDER — 0.9 % SODIUM CHLORIDE (POUR BTL) OPTIME
TOPICAL | Status: DC | PRN
  Administered 2011-08-27: 1000 mL

## 2011-08-27 MED ORDER — QUETIAPINE FUMARATE 100 MG PO TABS
100.0000 mg | ORAL_TABLET | Freq: Every day | ORAL | Status: DC
  Administered 2011-08-28: 100 mg via ORAL
  Filled 2011-08-27: qty 1

## 2011-08-27 MED ORDER — CEFAZOLIN SODIUM 1-5 GM-% IV SOLN: 1.0000 g | Freq: Three times a day (TID) | INTRAVENOUS | Status: DC

## 2011-08-27 MED ORDER — TRIAMTERENE-HCTZ 37.5-25 MG PO TABS
1.0000 | ORAL_TABLET | Freq: Every day | ORAL | Status: DC
  Administered 2011-08-28: 1 via ORAL
  Filled 2011-08-27: qty 1

## 2011-08-27 MED ORDER — LACTATED RINGERS IV SOLN
INTRAVENOUS | Status: DC | PRN
  Administered 2011-08-27: 11:00:00 via INTRAVENOUS

## 2011-08-27 MED ORDER — HYDROCODONE-ACETAMINOPHEN 5-325 MG PO TABS
1.0000 | ORAL_TABLET | ORAL | Status: DC | PRN
  Administered 2011-08-27 – 2011-08-28 (×4): 2 via ORAL
  Filled 2011-08-27 (×4): qty 2

## 2011-08-27 SURGICAL SUPPLY — 58 items
4.5 x 13 Variable Screw ×2 IMPLANT
APL SKNCLS STERI-STRIP NONHPOA (GAUZE/BANDAGES/DRESSINGS) ×1
BAG DECANTER FOR FLEXI CONT (MISCELLANEOUS) ×2 IMPLANT
BENZOIN TINCTURE PRP APPL 2/3 (GAUZE/BANDAGES/DRESSINGS) ×2 IMPLANT
BUR MATCHSTICK NEURO 3.0 LAGG (BURR) ×2 IMPLANT
CAGE LORDOTIC 8 SM PLUS (Cage) ×1 IMPLANT
CANISTER SUCTION 2500CC (MISCELLANEOUS) ×2 IMPLANT
CLOTH BEACON ORANGE TIMEOUT ST (SAFETY) ×2 IMPLANT
CONT SPEC 4OZ CLIKSEAL STRL BL (MISCELLANEOUS) ×2 IMPLANT
DRAPE C-ARM 42X72 X-RAY (DRAPES) ×4 IMPLANT
DRAPE LAPAROTOMY 100X72 PEDS (DRAPES) ×2 IMPLANT
DRAPE MICROSCOPE LEICA (MISCELLANEOUS) ×1 IMPLANT
DRAPE MICROSCOPE ZEISS OPMI (DRAPES) ×1 IMPLANT
DRAPE POUCH INSTRU U-SHP 10X18 (DRAPES) ×2 IMPLANT
DRESSING TELFA 8X3 (GAUZE/BANDAGES/DRESSINGS) ×2 IMPLANT
DRILL BIT HELIX 13MM (BIT) ×1 IMPLANT
DRSG OPSITE 4X5.5 SM (GAUZE/BANDAGES/DRESSINGS) ×2 IMPLANT
DURAPREP 6ML APPLICATOR 50/CS (WOUND CARE) ×2 IMPLANT
ELECT COATED BLADE 2.86 ST (ELECTRODE) ×2 IMPLANT
ELECT REM PT RETURN 9FT ADLT (ELECTROSURGICAL) ×2
ELECTRODE REM PT RTRN 9FT ADLT (ELECTROSURGICAL) ×1 IMPLANT
GAUZE SPONGE 4X4 16PLY XRAY LF (GAUZE/BANDAGES/DRESSINGS) IMPLANT
GLOVE BIO SURGEON STRL SZ8 (GLOVE) ×3 IMPLANT
GLOVE BIOGEL PI IND STRL 7.0 (GLOVE) IMPLANT
GLOVE BIOGEL PI IND STRL 8.5 (GLOVE) IMPLANT
GLOVE BIOGEL PI INDICATOR 7.0 (GLOVE) ×1
GLOVE BIOGEL PI INDICATOR 8.5 (GLOVE) ×1
GLOVE ECLIPSE 7.5 STRL STRAW (GLOVE) ×1 IMPLANT
GLOVE SS BIOGEL STRL SZ 6.5 (GLOVE) IMPLANT
GLOVE SUPERSENSE BIOGEL SZ 6.5 (GLOVE) ×2
GLOVE SURG SS PI 7.0 STRL IVOR (GLOVE) ×1 IMPLANT
GOWN BRE IMP SLV AUR LG STRL (GOWN DISPOSABLE) IMPLANT
GOWN BRE IMP SLV AUR XL STRL (GOWN DISPOSABLE) IMPLANT
GOWN STRL REIN 2XL LVL4 (GOWN DISPOSABLE) ×1 IMPLANT
HEAD HALTER (SOFTGOODS) IMPLANT
HEMOSTAT POWDER KIT SURGIFOAM (HEMOSTASIS) ×2 IMPLANT
KIT BASIN OR (CUSTOM PROCEDURE TRAY) ×2 IMPLANT
KIT ROOM TURNOVER OR (KITS) ×2 IMPLANT
NDL HYPO 25X1 1.5 SAFETY (NEEDLE) ×1 IMPLANT
NDL SPNL 20GX3.5 QUINCKE YW (NEEDLE) ×1 IMPLANT
NEEDLE HYPO 25X1 1.5 SAFETY (NEEDLE) ×2 IMPLANT
NEEDLE SPNL 20GX3.5 QUINCKE YW (NEEDLE) ×2 IMPLANT
NS IRRIG 1000ML POUR BTL (IV SOLUTION) ×2 IMPLANT
PACK LAMINECTOMY NEURO (CUSTOM PROCEDURE TRAY) ×2 IMPLANT
PAD ARMBOARD 7.5X6 YLW CONV (MISCELLANEOUS) ×6 IMPLANT
PLATE HELIX-R 24MM (Plate) ×1 IMPLANT
PUTTY BONE DBX 2.5 MIS (Bone Implant) ×2 IMPLANT
RUBBERBAND STERILE (MISCELLANEOUS) ×4 IMPLANT
SCREW 4.0X13 (Screw) IMPLANT
SCREW 4.0X13MM (Screw) ×2 IMPLANT
SPONGE INTESTINAL PEANUT (DISPOSABLE) ×2 IMPLANT
STRIP CLOSURE SKIN 1/2X4 (GAUZE/BANDAGES/DRESSINGS) ×2 IMPLANT
SUT VIC AB 3-0 SH 8-18 (SUTURE) ×2 IMPLANT
SYR 20ML ECCENTRIC (SYRINGE) ×2 IMPLANT
TOWEL OR 17X24 6PK STRL BLUE (TOWEL DISPOSABLE) ×2 IMPLANT
TOWEL OR 17X26 10 PK STRL BLUE (TOWEL DISPOSABLE) ×2 IMPLANT
TRAP SPECIMEN MUCOUS 40CC (MISCELLANEOUS) IMPLANT
WATER STERILE IRR 1000ML POUR (IV SOLUTION) ×2 IMPLANT

## 2011-08-27 NOTE — Anesthesia Procedure Notes (Signed)
Procedure Name: Intubation Date/Time: 08/27/2011 10:37 AM Performed by: Luster Landsberg Pre-anesthesia Checklist: Patient identified, Emergency Drugs available, Suction available and Patient being monitored Patient Re-evaluated:Patient Re-evaluated prior to inductionOxygen Delivery Method: Circle system utilized Preoxygenation: Pre-oxygenation with 100% oxygen Intubation Type: IV induction Ventilation: Mask ventilation without difficulty and Oral airway inserted - appropriate to patient size Laryngoscope Size: Mac and 3 Grade View: Grade I Tube type: Oral Tube size: 7.5 mm Number of attempts: 1 Airway Equipment and Method: Stylet Placement Confirmation: ETT inserted through vocal cords under direct vision,  positive ETCO2 and breath sounds checked- equal and bilateral Secured at: 20 cm Tube secured with: Tape Dental Injury: Teeth and Oropharynx as per pre-operative assessment

## 2011-08-27 NOTE — H&P (Signed)
Subjective:   Patient is a 56 y.o. female admitted for ACDF C4-5. The patient first presented to me with complaints of neck pain. Onset of symptoms was several months ago. The pain is described as aching and occurs all day. The pain is rated severe, and is located at the base of the neck with radiation to bilateral arms. The symptoms have been progressive. Symptoms are exacerbated by extending head backwards, and are relieved by rest.  Previous work up includes MRI of cervical spine, results: spinal stenosis.  Past Medical History  Diagnosis Date  . HTN (hypertension)   . Bipolar 1 disorder   . OA (osteoarthritis)   . GERD (gastroesophageal reflux disease)   . Hypercholesterolemia   . Fever blister   . HA (headache)   . Colon polyp   . CTS (carpal tunnel syndrome)   . Schizo-affective psychosis   . Anxiety     Past Surgical History  Procedure Date  . Neck surgery 2009  . Carpal tunnel release 20110 rt/lt  . Polp removed 2011  . Back injection   . 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 Amoxicillin Itching  . Chantix (Varenicline Tartrate) Nausea Only  . Effexor (Venlafaxine Hydrochloride) Other (See Comments)    unknown  . Paroxetine Hcl Other (See Comments)    unknown  . Penicillins Hives  . Zithromax (Azithromycin Dihydrate) Swelling  . Hydrocodone Other (See Comments)    headache    History  Substance Use Topics  . Smoking status: Current Everyday Smoker -- 0.2 packs/day  . Smokeless tobacco: Not on file  . Alcohol Use: No    Family History  Problem Relation Age of Onset  . Coronary artery disease Father   . Hypertension Mother   . Diabetes Mother   . Schizophrenia Mother   . Depression Brother   . Prostate cancer Brother   . Anesthesia problems Neg Hx   . Hypotension Neg Hx   . Malignant hyperthermia Neg Hx   . Pseudochol deficiency Neg Hx    Prior to Admission medications     Medication Sig Start Date End Date Taking? Authorizing Provider  Alum & Mag Hydroxide-Simeth (MAGIC MOUTHWASH) SOLN Take 5 mLs by mouth every 6 (six) hours as needed. 07/25/11 08/04/11  Madelaine Bhat, PA  chlorproMAZINE (THORAZINE) 25 MG tablet Take 25 mg by mouth every 6 (six) hours as needed. Takes 1-2 tables every 4 hours PRN for migraine    Historical Provider, MD  cyclobenzaprine (FLEXERIL) 10 MG tablet Take 10 mg by mouth 3 (three) times daily as needed. For muscle spasms    Historical Provider, MD  HYDROcodone-acetaminophen (NORCO) 5-325 MG per tablet Take 1 tablet by mouth 4 (four) times daily.    Historical Provider, MD  Menthol, Topical Analgesic, (ICY HOT BACK) 5 % PADS Apply 1 patch topically daily as needed. For back pain.    Historical Provider, MD  Multiple Vitamins-Minerals (MULTIVITAMIN WITH MINERALS) tablet Take 1 tablet by mouth daily.      Historical Provider, MD  omeprazole (PRILOSEC) 40 MG capsule Take 80 mg by mouth daily.     Historical Provider, MD  POTASSIUM CHLORIDE ER PO Take by mouth. Takes 20 meq daily    Historical Provider, MD  pravastatin (PRAVACHOL) 40 MG tablet Take 80 mg by mouth at bedtime.     Historical Provider, MD  QUEtiapine (SEROQUEL XR) 300 MG 24 hr  tablet Take 300 mg by mouth at bedtime.    Historical Provider, MD  QUEtiapine (SEROQUEL) 100 MG tablet Take 100 mg by mouth daily.     Historical Provider, MD  sertraline (ZOLOFT) 100 MG tablet Take 100 mg by mouth daily.    Historical Provider, MD  triamterene-hydrochlorothiazide (MAXZIDE-25) 37.5-25 MG per tablet Take 1 tablet by mouth daily.      Historical Provider, MD  vitamin B-12 (CYANOCOBALAMIN) 100 MCG tablet Take 100 mcg by mouth daily.     Historical Provider, MD  zonisamide (ZONEGRAN) 100 MG capsule Take 100 mg by mouth QID. Takes 4 at bedtime    Historical Provider, MD     Review of Systems  Positive ROS: Negative  All other systems have been reviewed and were otherwise negative with  the exception of those mentioned in the HPI and as above.  Objective: Vital signs in last 24 hours:    General Appearance: Alert, cooperative, no distress, appears stated age Head: Normocephalic, without obvious abnormality, atraumatic Eyes: PERRL, conjunctiva/corneas clear, EOM's intact, fundi benign, both eyes      Ears: Normal TM's and external ear canals, both ears Throat: Lips, mucosa, and tongue normal; teeth and gums normal Neck: Supple, symmetrical, trachea midline, no adenopathy; thyroid: No enlargement/tenderness/nodules; no carotid bruit or JVD Back: Symmetric, no curvature, ROM normal, no CVA tenderness Lungs: Clear to auscultation bilaterally, respirations unlabored Heart: Regular rate and rhythm, S1 and S2 normal, no murmur, rub or gallop Abdomen: Soft, non-tender, bowel sounds active all four quadrants, no masses, no organomegaly Extremities: Extremities normal, atraumatic, no cyanosis or edema Pulses: 2+ and symmetric all extremities Skin: Skin color, texture, turgor normal, no rashes or lesions  NEUROLOGIC:  Mental status: Alert and oriented x4, no aphasia, good attention span, fund of knowledge and memory  Motor Exam - grossly normal Sensory Exam - grossly normal Reflexes: 1+ Coordination - grossly normal Gait - grossly normal Balance - grossly normal Cranial Nerves: I: smell Not tested  II: visual acuity  OS: nl    OD: nl  II: visual fields Full to confrontation  II: pupils Equal, round, reactive to light  III,VII: ptosis None  III,IV,VI: extraocular muscles  Full ROM  V: mastication Normal  V: facial light touch sensation  Normal  V,VII: corneal reflex  Present  VII: facial muscle function - upper  Normal  VII: facial muscle function - lower Normal  VIII: hearing Not tested  IX: soft palate elevation  Normal  IX,X: gag reflex Present  XI: trapezius strength  5/5  XI: sternocleidomastoid strength 5/5  XI: neck flexion strength  5/5  XII: tongue  strength  Normal    Data Review Lab Results  Component Value Date   WBC 9.9 08/25/2011   HGB 13.8 08/25/2011   HCT 39.8 08/25/2011   MCV 87.9 08/25/2011   PLT 395 08/25/2011   Lab Results  Component Value Date   NA 139 08/25/2011   K 3.8 08/25/2011   CL 100 08/25/2011   CO2 28 08/25/2011   BUN 14 08/25/2011   CREATININE 1.04 08/25/2011   GLUCOSE 97 08/25/2011   Lab Results  Component Value Date   INR 0.94 08/25/2011    Assessment:   Cervical neck pain with herniated nucleus pulposus/ spondylosis/ stenosis at ACDF C4-5 adjacent to previous fusion. Patient has failed conservative therapy. Planned surgery : ACDF C4-5  Plan:   I explained the condition and procedure to the patient and answered any questions.  Patient wishes  to proceed with procedure as planned. Understands risks/ benefits/ and expected or typical outcomes.  Haniya Fern S 08/27/2011 7:41 AM

## 2011-08-27 NOTE — Op Note (Signed)
08/27/2011  12:14 PM  PATIENT:  Gloria Lewis  56 y.o. 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 Decompressive anterior cervical discectomy C4-5, 2. Anterior cervical arthrodesis C4-5 utilizing an 8 mm peek interbody cage packed with local autograft and morcellized allograft 3. Anterior cervical plating C4-5 utilizing a 24 mm helix plate, 4. Removal of anterior cervical hardware C5-C7  SURGEON:  Marikay Alar, MD  ASSISTANTS: Dr. Venetia Maxon  ANESTHESIA:   General  EBL: 25 ml     BLOOD ADMINISTERED:none  DRAINS: None   SPECIMEN:  No Specimen  INDICATION FOR PROCEDURE: This patient had a previous ACDF at C5 and C6 and C6 and C7 and did well with that, presented with recurrent neck and arm pain. MRI showed adjacent level spondylosis with stenosis at C4-5. She tried medical management for many months without relief. I recommended ACDF with plating at C4-5.  Patient understood the risks, benefits, and alternatives and potential outcomes and wished to proceed.  PROCEDURE DETAILS: Patient was brought to the operating room placed under general endotracheal anesthesia. Patient was placed in the supine position on the operating room table. The neck was prepped with Duraprep and draped in a sterile fashion.   Three cc of local anesthesia was injected and a transverse incision was made on the right side of the neck.  Dissection was carried down thru the subcutaneous tissue and the platysma was  elevated, opened, and undermined with Metzenbaum scissors.  Dissection was then carried out thru an avascular plane leaving the sternocleidomastoid carotid artery and jugular vein laterally and the trachea and esophagus medially. The ventral aspect of the vertebral column was identified and a localizing x-ray was taken. The C4-5 level was identified. Also identified the previous anterior cervical plate and bluntly removed the  soft tissues from the front of this. I then used the screwdriver to remove all 6 screws and then removed the plate. I then waxed the holes to control any hemorrhage from the old screw holes. The longus colli muscles were then elevated and the retractor was placed at C4-5. The annulus of C4-5 was incised and the disc space entered. Discectomy was performed with micro-curettes and pituitary rongeurs. I then used the high-speed drill to drill the endplates down to the level of the posterior longitudinal ligament. The drill shavings were saved in a mucous trap for later arthrodesis. The operating microscope was draped and brought into the field provided additional magnification, illumination and visualization. Discectomy was continued posteriorly thru the disc space. Posterior longitudinal ligament was opened with a nerve hook, and then removed along with disc herniation and osteophytes, decompressing the spinal canal and thecal sac. We then continued to remove osteophytic overgrowth and disc material decompressing the neural foramina and exiting nerve roots bilaterally. The scope was angled up and down to help decompress and undercut the vertebral bodies. Once the decompression was completed we could pass a nerve hook circumferentially to assure adequate decompression in the midline and in the neural foramina. So by both visualization and palpation we felt we had an adequate decompression of the neural elements. We then measured the height of the intravertebral disc space and selected a 8 millimeter Peek interbody cage packed with autograft and morcellized allograft. It was then gently positioned in the intravertebral disc space and countersunk. I then used a 24 mm helix plate and placed four variable angle screws into the vertebral bodies and locked them into  position. The wound was irrigated with bacitracin solution, checked for hemostasis which was established and confirmed. Once meticulous hemostasis was achieved,  we then proceeded with closure. The platysma was closed with interrupted 3-0 undyed Vicryl suture, the subcuticular layer was closed with interrupted 3-0 undyed Vicryl suture. The skin edges were approximated with steristrips. The drapes were removed. A sterile dressing was applied. The patient was then awakened from general anesthesia and transferred to the recovery room in stable condition. At the end of the procedure all sponge, needle and instrument counts were correct.   PLAN OF CARE: Admit for overnight observation  PATIENT DISPOSITION:  PACU - hemodynamically stable.   Delay start of Pharmacological VTE agent (>24hrs) due to surgical blood loss or risk of bleeding:  yes

## 2011-08-27 NOTE — Transfer of Care (Signed)
Immediate Anesthesia Transfer of Care Note  Patient: Gloria Lewis  Procedure(s) Performed: Procedure(s) (LRB): ANTERIOR CERVICAL DECOMPRESSION/DISCECTOMY FUSION 1 LEVEL/HARDWARE REMOVAL (Bilateral)  Patient Location: PACU  Anesthesia Type: General  Level of Consciousness: awake, alert  and oriented  Airway & Oxygen Therapy: Patient Spontanous Breathing and Patient connected to nasal cannula oxygen  Post-op Assessment: Report given to PACU RN, Post -op Vital signs reviewed and stable, Patient moving all extremities and Patient able to stick tongue midline  Post vital signs: Reviewed and stable  Complications: No apparent anesthesia complications

## 2011-08-27 NOTE — Anesthesia Preprocedure Evaluation (Signed)
Anesthesia Evaluation  Patient identified by MRN, date of birth, ID band Patient awake    Reviewed: Allergy & Precautions, H&P , NPO status   History of Anesthesia Complications Negative for: history of anesthetic complications  Airway  TM Distance: >3 FB Neck ROM: Full    Dental  (+) Edentulous Upper, Edentulous Lower and Dental Advisory Given   Pulmonary Current Smoker,  breath sounds clear to auscultation  Pulmonary exam normal       Cardiovascular hypertension, Pt. on medications Rhythm:Regular Rate:Normal     Neuro/Psych Anxiety Depression    GI/Hepatic Neg liver ROS, GERD-  Medicated and Controlled,  Endo/Other  Morbid obesity  Renal/GU negative Renal ROS     Musculoskeletal   Abdominal   Peds  Hematology   Anesthesia Other Findings   Reproductive/Obstetrics                           Anesthesia Physical Anesthesia Plan  ASA: III  Anesthesia Plan: General   Post-op Pain Management:    Induction: Intravenous  Airway Management Planned: Oral ETT  Additional Equipment:   Intra-op Plan:   Post-operative Plan: Extubation in OR  Informed Consent: I have reviewed the patients History and Physical, chart, labs and discussed the procedure including the risks, benefits and alternatives for the proposed anesthesia with the patient or authorized representative who has indicated his/her understanding and acceptance.   Dental advisory given  Plan Discussed with:   Anesthesia Plan Comments:         Anesthesia Quick Evaluation

## 2011-08-27 NOTE — Anesthesia Postprocedure Evaluation (Signed)
Anesthesia Post Note  Patient: Gloria Lewis  Procedure(s) Performed: Procedure(s) (LRB): ANTERIOR CERVICAL DECOMPRESSION/DISCECTOMY FUSION 1 LEVEL/HARDWARE REMOVAL (Bilateral)  Anesthesia type: general  Patient location: PACU  Post pain: Pain level controlled  Post assessment: Patient's Cardiovascular Status Stable  Last Vitals:  Filed Vitals:   08/27/11 1345  BP:   Pulse: 67  Temp:   Resp: 17    Post vital signs: Reviewed and stable  Level of consciousness: sedated  Complications: No apparent anesthesia complications

## 2011-08-28 MED ORDER — OXYCODONE-ACETAMINOPHEN 5-325 MG PO TABS: 2.0000 | ORAL_TABLET | ORAL | Status: AC | PRN

## 2011-08-28 MED ORDER — CYCLOBENZAPRINE HCL 10 MG PO TABS: 10.0000 mg | ORAL_TABLET | Freq: Three times a day (TID) | ORAL | Status: DC | PRN

## 2011-08-28 NOTE — Discharge Instructions (Signed)
Wound Care Keep incision covered and dry for one week.  If you shower prior to then, cover incision with plastic wrap.  You may remove outer bandage after one week and shower.  Do not put any creams, lotions, or ointments on incision. Leave steri-strips on neck.  They will fall off by themselves. Activity Walk each and every day, increasing distance each day. No lifting greater than 5 lbs.  Avoid excessive neck motion. No driving for 2 weeks; may ride as a passenger locally.  Diet Resume your normal diet.  Return to Work Will be discussed at you follow up appointment. Call Your Doctor If Any of These Occur Redness, drainage, or swelling at the wound.  Temperature greater than 101 degrees. Severe pain not relieved by pain medication. Increased difficulty swallowing.  Incision starts to come apart. Follow Up Appt Call today for appointment in 1-2 weeks ([phone]) or for problems.  If you have any hardware placed in your spine, you will need an x-ray before your appointment.  

## 2011-08-28 NOTE — Progress Notes (Signed)
UR COMPLETED  

## 2011-08-28 NOTE — Discharge Summary (Signed)
Physician Discharge Summary  Patient ID: Gloria Lewis MRN: [id number] DOB/AGE: 55-Sep-1957 55 y.o.  Admit date: 08/27/2011 Discharge date: 08/28/2011  Admission Diagnoses: Cervical spondylosis     Discharge Diagnoses: Same   Discharged Condition: good  Hospital Course: The patient was admitted on 08/27/2011 and taken to the operating room where the patient underwent ACDF C4-5. The patient tolerated the procedure well and was taken to the recovery room and then to the floor in stable condition. The hospital course was routine. There were no complications. The wound remained clean dry and intact. Pt had appropriate neck soreness. No complaints of arm pain or new N/T/W. The patient remained afebrile with stable vital signs, and tolerated a regular diet. The patient continued to increase activities, and pain was well controlled with oral pain medications.   Consults: None  Significant Diagnostic Studies:  Results for orders placed during the hospital encounter of 08/25/11  APTT      Component Value Range   aPTT 28  24 - 37 (seconds)  BASIC METABOLIC PANEL      Component Value Range   Sodium 139  135 - 145 (mEq/L)   Potassium 3.8  3.5 - 5.1 (mEq/L)   Chloride 100  96 - 112 (mEq/L)   CO2 28  19 - 32 (mEq/L)   Glucose, Bld 97  70 - 99 (mg/dL)   BUN 14  6 - 23 (mg/dL)   Creatinine, Ser 4.09  0.50 - 1.10 (mg/dL)   Calcium 81.1  8.4 - 10.5 (mg/dL)   GFR calc non Af Amer 60 (*) >90 (mL/min)   GFR calc Af Amer 69 (*) >90 (mL/min)  CBC      Component Value Range   WBC 9.9  4.0 - 10.5 (K/uL)   RBC 4.53  3.87 - 5.11 (MIL/uL)   Hemoglobin 13.8  12.0 - 15.0 (g/dL)   HCT 91.4  78.2 - 95.6 (%)   MCV 87.9  78.0 - 100.0 (fL)   MCH 30.5  26.0 - 34.0 (pg)   MCHC 34.7  30.0 - 36.0 (g/dL)   RDW 21.3  08.6 - 57.8 (%)   Platelets 395  150 - 400 (K/uL)  DIFFERENTIAL      Component Value Range   Neutrophils Relative 60  43 - 77 (%)   Neutro Abs 6.0  1.7 - 7.7 (K/uL)   Lymphocytes Relative 31   12 - 46 (%)   Lymphs Abs 3.1  0.7 - 4.0 (K/uL)   Monocytes Relative 7  3 - 12 (%)   Monocytes Absolute 0.7  0.1 - 1.0 (K/uL)   Eosinophils Relative 1  0 - 5 (%)   Eosinophils Absolute 0.1  0.0 - 0.7 (K/uL)   Basophils Relative 0  0 - 1 (%)   Basophils Absolute 0.0  0.0 - 0.1 (K/uL)  PROTIME-INR      Component Value Range   Prothrombin Time 12.8  11.6 - 15.2 (seconds)   INR 0.94  0.00 - 1.49   SURGICAL PCR SCREEN      Component Value Range   MRSA, PCR NEGATIVE  NEGATIVE    Staphylococcus aureus NEGATIVE  NEGATIVE     Dg Cervical Spine 1 View  08/27/2011  *RADIOLOGY REPORT*  Clinical Data: C4-5 FUSION.  REMOVAL OF PLATE C5 DISEASE SEVEN.  DG C-ARM 1-60 MIN,DG CERVICAL SPINE - 1 VIEW  Comparison: MRI 05/09/2011  Findings: Changes of anterior fusion noted with anterior plate at I6-9.  Normal alignment at this level.  Cannot visualize below the C5 level due to overlying shoulders.  IMPRESSION: C4-5 anterior fusion.  Original Report Authenticated By: Cyndie Chime, M.D.   Dg C-arm 1-60 Min  08/27/2011  *RADIOLOGY REPORT*  Clinical Data: C4-5 FUSION.  REMOVAL OF PLATE C5 DISEASE SEVEN.  DG C-ARM 1-60 MIN,DG CERVICAL SPINE - 1 VIEW  Comparison: MRI 05/09/2011  Findings: Changes of anterior fusion noted with anterior plate at Z6-1.  Normal alignment at this level.  Cannot visualize below the C5 level due to overlying shoulders.  IMPRESSION: C4-5 anterior fusion.  Original Report Authenticated By: Cyndie Chime, M.D.    Antibiotics:  Anti-infectives     Start     Dose/Rate Route Frequency Ordered Stop   08/27/11 1500   ceFAZolin (ANCEF) IVPB 1 g/50 mL premix  Status:  Discontinued        1 g 100 mL/hr over 30 Minutes Intravenous Every 8 hours 08/27/11 1457 08/27/11 1459   08/27/11 1054   bacitracin 50,000 Units in sodium chloride irrigation 0.9 % 500 mL irrigation  Status:  Discontinued          As needed 08/27/11 1121 08/27/11 1216   08/27/11 1020   bacitracin 09604 UNITS injection      Comments: FREEZE, PATRICIA: cabinet override         08/27/11 1020 08/27/11 2229   08/27/11 0000   vancomycin (VANCOCIN) IVPB 1000 mg/200 mL premix  Status:  Discontinued        1,000 mg 200 mL/hr over 60 Minutes Intravenous 120 min pre-op 08/26/11 1258 08/26/11 1258   08/27/11 0000   vancomycin (VANCOCIN) 1,500 mg in sodium chloride 0.9 % 500 mL IVPB        1,500 mg 250 mL/hr over 120 Minutes Intravenous 120 min pre-op 08/26/11 1258 08/27/11 1019   08/24/11 0704   bacitracin 54098 UNITS injection     Comments: KEY, JENNIFER: cabinet override         08/24/11 0704 08/24/11 1914          Discharge Exam: Blood pressure 130/77, pulse 75, temperature 97.9 F (36.6 C), temperature source Oral, resp. rate 18, SpO2 94.00%. Neurologic: Grossly normal Incision clean dry and intact  Discharge Medications:   Medication List  As of 08/28/2011 10:57 AM   STOP taking these medications         HYDROcodone-acetaminophen 5-325 MG per tablet         TAKE these medications         chlorproMAZINE 25 MG tablet   Commonly known as: THORAZINE   Take 25 mg by mouth every 6 (six) hours as needed. Takes 1-2 tables every 4 hours PRN for migraine      cyclobenzaprine 10 MG tablet   Commonly known as: FLEXERIL   Take 1 tablet (10 mg total) by mouth 3 (three) times daily as needed. For muscle spasms      ICY HOT BACK 5 % Pads   Generic drug: Menthol (Topical Analgesic)   Apply 1 patch topically daily as needed. For back pain.      magic mouthwash Soln   Take 5 mLs by mouth every 6 (six) hours as needed.      multivitamin with minerals tablet   Take 1 tablet by mouth daily.      omeprazole 40 MG capsule   Commonly known as: PRILOSEC   Take 80 mg by mouth daily.      oxyCODONE-acetaminophen 5-325 MG per tablet   Commonly  known as: PERCOCET   Take 2 tablets by mouth every 4 (four) hours as needed for pain.      POTASSIUM CHLORIDE ER PO   Take by mouth. Takes 20 meq daily       pravastatin 40 MG tablet   Commonly known as: PRAVACHOL   Take 80 mg by mouth at bedtime.      QUEtiapine 100 MG tablet   Commonly known as: SEROQUEL   Take 100 mg by mouth daily.      QUEtiapine 300 MG 24 hr tablet   Commonly known as: SEROQUEL XR   Take 300 mg by mouth at bedtime.      sertraline 100 MG tablet   Commonly known as: ZOLOFT   Take 100 mg by mouth daily.      triamterene-hydrochlorothiazide 37.5-25 MG per tablet   Commonly known as: MAXZIDE-25   Take 1 tablet by mouth daily.      vitamin B-12 100 MCG tablet   Commonly known as: CYANOCOBALAMIN   Take 100 mcg by mouth daily.      zonisamide 100 MG capsule   Commonly known as: ZONEGRAN   Take 400 mg by mouth at bedtime.            Disposition: home   Final Dx: ACDF  Discharge Orders    Future Appointments: Provider: Department: Dept Phone: Center:   09/08/2011 8:00 AM [facility] [phone]     Future Orders Please Complete By Expires   Diet - low sodium heart healthy      Increase activity slowly      Driving Restrictions      Comments:   1 week   Lifting restrictions      Comments:   Less than 10 pounds   No wound care      Call MD for:  temperature >100.4      Call MD for:  persistant nausea and vomiting      Call MD for:  severe uncontrolled pain      Call MD for:  redness, tenderness, or signs of infection (pain, swelling, redness, odor or green/yellow discharge around incision site)      Call MD for:  difficulty breathing, headache or visual disturbances         Follow-up Information    Follow up with Dangela How S, MD. Schedule an appointment as soon as possible for a visit in 2 weeks.   Contact information:   [address], [address] [place] [address] [phone]           Signed: Tia Alert 08/28/2011, 10:57 AM

## 2011-09-01 ENCOUNTER — Encounter (HOSPITAL_COMMUNITY): Payer: Self-pay | Admitting: Neurological Surgery

## 2011-09-08 ENCOUNTER — Ambulatory Visit (HOSPITAL_COMMUNITY): Payer: Medicaid Other

## 2011-10-01 ENCOUNTER — Ambulatory Visit (HOSPITAL_COMMUNITY)
Admission: RE | Admit: 2011-10-01 | Discharge: 2011-10-01 | Disposition: A | Discharge status: Home / Self Care | Payer: Medicaid Other | Source: Ambulatory Visit | Attending: Family Medicine | Admitting: Family Medicine

## 2011-10-01 DIAGNOSIS — Z1231 Encounter for screening mammogram for malignant neoplasm of breast: Secondary | ICD-10-CM

## 2011-12-02 ENCOUNTER — Ambulatory Visit (INDEPENDENT_AMBULATORY_CARE_PROVIDER_SITE_OTHER): Payer: Medicaid Other | Admitting: Psychiatry

## 2011-12-02 ENCOUNTER — Encounter (HOSPITAL_COMMUNITY): Payer: Self-pay | Admitting: Psychiatry

## 2011-12-02 VITALS — BP 120/80 | HR 64 | Resp 12 | Wt 233.2 lb

## 2011-12-02 DIAGNOSIS — F259 Schizoaffective disorder, unspecified: Secondary | ICD-10-CM

## 2011-12-02 MED ORDER — QUETIAPINE FUMARATE ER 300 MG PO TB24: 300.0000 mg | ORAL_TABLET | Freq: Every day | ORAL | Status: DC

## 2011-12-02 MED ORDER — QUETIAPINE FUMARATE 100 MG PO TABS: 100.0000 mg | ORAL_TABLET | Freq: Every day | ORAL | Status: DC

## 2011-12-02 MED ORDER — SERTRALINE HCL 100 MG PO TABS: 100.0000 mg | ORAL_TABLET | Freq: Every day | ORAL | Status: DC

## 2011-12-02 NOTE — Progress Notes (Signed)
Psychiatric Assessment Adult  Patient Identification:  Gloria Lewis Date of Evaluation:  12/02/2011 Chief Complaint: Depressed History of Chief Complaint:  No chief complaint on file. this patient is a 56 year old single mother who has a significant past psychiatric history and is diagnosed with schizoaffective disorder. She is on disability for this condition. She previously has been seen at [facility] and was seen by a private therapist. Unfortunately her private therapist is no longer available to see her. She was also unhappy with a brief type of treatment she received at the [facility] program. Recently one of her closest friends died of cancer just 3 weeks ago. Over the last 2 months however she's been experiencing increasing depression now are occurring at least 4/7 days. Her sleep pattern is normal for her she has had a significant weight loss of 10 pounds over the last month. Her appetite is distinctly reduced. Her energy level is good and her concentration is stable. She is not anhedonic and she enjoys singing in the choir, reading and watching TV. She denies a feeling of worthlessness and distinctly denies suicidal ideation. She does describe increasing anxiety. 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 She has significant consequences due to these agents including the loss of her 3 children the court system. Eventually the patient became clean and has been that way since. Presently her her 2 sons aged 51 and 63 live with her. Her oldest son however has a significant mental illness characterized as bipolar disorder and he is in a special program for this condition. While he does go to work he does not do his responsibilities in the home setting which distresses this patient a great deal. she describes her biggest stress as this older son is mentally ill.HPIat this time the patient is functioning quite well. She  recently finished at been at college and is now trying to complete a degree in social work at [employer] . This patient has never experienced hallucinations now or ever. She actually denies a clear episode of major depression or mania when she was drug and alcohol free. In essence all of her mental/affect of symptoms occurred in the context of drug abuse. She denies specific symptoms of generalized anxiety disorder, panic disorder or OCD. It should be noted that her brother committed suicide in 2001. Review of Systems Physical Exam  Depressive Symptoms: depressed mood,  (Hypo) Manic Symptoms:   Elevated Mood:  No Irritable Mood:  No Grandiosity:  No Distractibility:  No Labiality of Mood:  No Delusions:  No Hallucinations:  No Impulsivity:  No Sexually Inappropriate Behavior:  No Financial Extravagance:  No Flight of Ideas:  No  Anxiety Symptoms: Excessive Worry:  No Panic Symptoms:  no Agoraphobia:  No Obsessive Compulsive: No  Symptoms: None, Specific Phobias:  No Social Anxiety:  No  Psychotic Symptoms:  Hallucinations: No None Delusions:  No Paranoia:  No   Ideas of Reference:  No  PTSD Symptoms: Ever had a traumatic exposure:  No Had a traumatic exposure in the last month:  No Re-experiencing: No None Hypervigilance:  No Hyperarousal: No None Avoidance: No None  Traumatic Brain Injury: No   Past Psychiatric History: Diagnosis:schizoaffective disorder   Hospitalizations:1 hospitalization in 2006 at the [facility]  Outpatient Care:been in therapy in the community up until the last year  Substance Abuse Care:   Self-Mutilation:   Suicidal Attempts:   Violent Behaviors:  Past Medical History:   Past Medical History  Diagnosis Date  . HTN (hypertension)   . Bipolar 1 disorder   . OA (osteoarthritis)   . GERD (gastroesophageal reflux disease)   . Hypercholesterolemia   . Fever blister   . HA (headache)   . Colon polyp   . CTS (carpal tunnel  syndrome)   . Schizo-affective psychosis   . Anxiety    History of Loss of Consciousness:  No Seizure History:  No Cardiac History:  No Allergies:   Allergies  Allergen Reactions  . Amoxicillin Itching  . Chantix (Varenicline Tartrate) Nausea Only  . Effexor (Venlafaxine Hydrochloride) Other (See Comments)    unknown  . Paroxetine Hcl Other (See Comments)    unknown  . Penicillins Hives  . Zithromax (Azithromycin Dihydrate) Swelling  . Hydrocodone Other (See Comments)    headache   Current Medications:  Current Outpatient Prescriptions  Medication Sig Dispense Refill  . Alum & Mag Hydroxide-Simeth (MAGIC MOUTHWASH) SOLN Take 5 mLs by mouth every 6 (six) hours as needed.  100 mL  0  . chlorproMAZINE (THORAZINE) 25 MG tablet Take 25 mg by mouth every 6 (six) hours as needed. Takes 1-2 tables every 4 hours PRN for migraine      . cyclobenzaprine (FLEXERIL) 10 MG tablet Take 1 tablet (10 mg total) by mouth 3 (three) times daily as needed. For muscle spasms  60 tablet  1  . Menthol, Topical Analgesic, (ICY HOT BACK) 5 % PADS Apply 1 patch topically daily as needed. For back pain.      . Multiple Vitamins-Minerals (MULTIVITAMIN WITH MINERALS) tablet Take 1 tablet by mouth daily.        Marland Kitchen omeprazole (PRILOSEC) 40 MG capsule Take 80 mg by mouth daily.       Marland Kitchen POTASSIUM CHLORIDE ER PO Take by mouth. Takes 20 meq daily      . pravastatin (PRAVACHOL) 40 MG tablet Take 80 mg by mouth at bedtime.       Marland Kitchen QUEtiapine (SEROQUEL XR) 300 MG 24 hr tablet Take 300 mg by mouth at bedtime.      Marland Kitchen QUEtiapine (SEROQUEL) 100 MG tablet Take 100 mg by mouth daily.       . sertraline (ZOLOFT) 100 MG tablet Take 100 mg by mouth daily.      Marland Kitchen triamterene-hydrochlorothiazide (MAXZIDE-25) 37.5-25 MG per tablet Take 1 tablet by mouth daily.        . vitamin B-12 (CYANOCOBALAMIN) 100 MCG tablet Take 100 mcg by mouth daily.       Marland Kitchen zonisamide (ZONEGRAN) 100 MG capsule Take 400 mg by mouth at bedtime.        Previous Psychotropic Medications:  Medication Dose   Seroquel 300 mg XR, Seroquel 100 mg when necessary, Zoloft 100 mg in the morning, Valium 5 mg every morning when necessary                       Substance Abuse History in the last 12 months: Substance Age of 1st Use Last Use Amount Specific Type  Nicotine          Alcohol    2006  Inhalants      Others:      Crack cocian                    Medical Consequences of Substance Abuse:   Legal Consequences of Substance Abuse: lost her children  Family Consequences of Substance Abuse:   Blackouts:  No DT's:  No Withdrawal Symptoms:  No None  Social History: Current Place of Residence[place] of Birth:  Family Members:  Marital Status:  Single Children: 3  Sons:   Daughters:  Relationships:  Education:  Corporate treasurer Problems/Performance:  Religious Beliefs/Practices:  History of Abuse:  Teacher, music History:   Legal History:  Hobbies/Interests:   Family History:   Family History  Problem Relation Age of Onset  . Coronary artery disease Father   . Hypertension Mother   . Diabetes Mother   . Schizophrenia Mother   . Depression Brother   . Prostate cancer Brother   . Anesthesia problems Neg Hx   . Hypotension Neg Hx   . Malignant hyperthermia Neg Hx   . Pseudochol deficiency Neg Hx     Mental Status Examination/Evaluation: Objective:  Appearance: Fairly Groomed  Patent attorney::  Good  Speech:  Normal Rate  Volume:  Normal  Mood:  good  Affect:  Depressed  Thought Process:  Coherent  Orientation:  Full  Thought Content:  WDL  Suicidal Thoughts:  No  Homicidal Thoughts:  No  Judgement:  Good  Insight:  Good  Psychomotor Activity:  Normal  Akathisia:  No  Handed:  Right  AIMS (if indicated):    Assets:  Communication Skills    Laboratory/X-Ray Psychological Evaluation(s)        Assessment:   Axis I: Schizoaffective Disorder  AXIS I Schizoaffective Disorder  AXIS II No diagnosis  AXIS III Past Medical History  Diagnosis Date  . HTN (hypertension)   . Bipolar 1 disorder   . OA (osteoarthritis)   . GERD (gastroesophageal reflux disease)   . Hypercholesterolemia   . Fever blister   . HA (headache)   . Colon polyp   . CTS (carpal tunnel syndrome)   . Schizo-affective psychosis   . Anxiety      AXIS IV educational problems  AXIS V 61-70 mild symptoms   Treatment Plan/Recommendations:  Plan of Care: at this time the patient will continue taking Seroquel 300 mg XR, Seroquel 100 mg as needed, Zoloft 100 mg and Valium 5 mg when necessary. At this time we will get her a therapist for ongoing psychotherapy. The patient seems to be psychologically and verbally fairly sophisticated. She's been in therapy and benefited from it in the past. I think this patient also is going to a grieving process with the death of her friend from cancer. She'll return to see me in approximately 2 months note also is that she's been on multiple psychotropic medications which had not been on that beneficial. He does a lot of she's on now she claims has never had much of an effect. She is failed Paxil, Risperdal and Abilify in the past. I suspect psychotherapy will be the most important intervention in this patient's care.  Laboratory:    Psychotherapy:   Medications: Seroquel 300 mg XR, Seroquel 100 mg when necessary, Zoloft 100 mg every morning Valium 5 mg when necessary  Routine PRN Medications:  No  Consultations:    Safety Concerns:    Other:      Lucas Mallow, MD 8/28/20132:08  PM

## 2011-12-15 ENCOUNTER — Ambulatory Visit (INDEPENDENT_AMBULATORY_CARE_PROVIDER_SITE_OTHER): Payer: Federal, State, Local not specified - Other | Admitting: Licensed Clinical Social Worker

## 2011-12-15 ENCOUNTER — Encounter (HOSPITAL_COMMUNITY): Payer: Self-pay | Admitting: Licensed Clinical Social Worker

## 2011-12-15 DIAGNOSIS — F259 Schizoaffective disorder, unspecified: Secondary | ICD-10-CM

## 2011-12-15 NOTE — Progress Notes (Signed)
Patient ID: Gloria Lewis, female   DOB: [birth year], 56 y.o.   MRN: [id number] Patient:   Gloria Lewis   DOB:   [birth year]  MR Number:  [id number]  Location:  [facility] [address] [place] [address] Dept: [phone]           Date of Service:   12/15/2011  Start Time:   3:00pm End Time:   3:50pm  Provider/Observer:  Geanie Berlin LCSW       Billing Code/Service: [phone]  Chief Complaint:     Chief Complaint  Patient presents with  . Anxiety  . Depression    grieving the death of her best friend, poor comphresion   . Panic Attack    while on the bus, grocery store   . Stress  . Agitation    Reason for Service:  Patient is referred by Dr. Donell Beers for the treatment of schizoaffective disorder and increased anxiety related to managing multiple stressors such as being a full time student and parent, as well as, multiple other commitments.   Current Status:  She has been diagnosed with schizoaffective disorder and reports that her psychosis is well controlled. She is depressed, but this is related to the death of a good friend and she endorses normal grieving and daily crying spells, which have recently stopped. She endorses panic attacks while on the bus going to school because there are too many people. She has had the bus stop twice to let her off not at her desired location since school has started. This also occurs while she is in the grocery store. She is very busy and enjoys her life, just feels overwhelmed and realizes that she must make some choices about her commitments and re-prioritize her energy so as to succeed in school and be present as a mother. Her sleep is poor, only four hours per night and her appetite is inconsistent. She denies any current AH, VH or paranoia. Her thinking is clear and goal directed and she is motivated for treatment.   Reliability of Information: Very  good.  Behavioral Observation: Gloria Lewis  presents as a 56 y.o.-year-old  African American Female who appeared her stated age. her dress was Appropriate and she was Neat and her manners were Appropriate to the situation.  There were not any physical disabilities noted.  she displayed an appropriate level of cooperation and motivation.    Interactions:    Active   Attention:   normal  Memory:   normal  Visuo-spatial:   normal  Speech (Volume):  normal  Speech:   normal pitch and normal volume  Thought Process:  Coherent and Relevant  Though Content:  WNL  Orientation:   person, place and time/date  Judgment:   Good  Planning:   Good  Affect:    Anxious  Mood:    Anxious  Insight:   Good  Intelligence:   normal  Marital Status/Living: Never married. Two son's with different men.   Current Employment: In school.   Past Employment:  On disability. Financial struggles. Sometimes runs out of food by the end of the month.   Substance Use:  There is a documented history of alcohol, cocaine and marijuana abuse confirmed by the patient. She reports that she has been sober since 2006 without any relapses. She attends NA and AA meetings on occasion.    Education:   Control and instrumentation engineer History:   Past Medical History  Diagnosis Date  . HTN (hypertension)   . Bipolar 1 disorder   . OA (osteoarthritis)   . GERD (gastroesophageal reflux disease)   . Hypercholesterolemia   . Fever blister   . HA (headache)   . Colon polyp   . CTS (carpal tunnel syndrome)   . Schizo-affective psychosis   . Anxiety   . Depression         Outpatient Encounter Prescriptions as of 12/15/2011  Medication Sig Dispense Refill  . Alum & Mag Hydroxide-Simeth (MAGIC MOUTHWASH) SOLN Take 5 mLs by mouth every 6 (six) hours as needed.  100 mL  0  . chlorproMAZINE (THORAZINE) 25 MG tablet Take 25 mg by mouth every 6 (six) hours as needed. Takes 1-2 tables every 4 hours PRN for migraine      .  cyclobenzaprine (FLEXERIL) 10 MG tablet Take 1 tablet (10 mg total) by mouth 3 (three) times daily as needed. For muscle spasms  60 tablet  1  . Menthol, Topical Analgesic, (ICY HOT BACK) 5 % PADS Apply 1 patch topically daily as needed. For back pain.      . Multiple Vitamins-Minerals (MULTIVITAMIN WITH MINERALS) tablet Take 1 tablet by mouth daily.        Marland Kitchen omeprazole (PRILOSEC) 40 MG capsule Take 80 mg by mouth daily.       Marland Kitchen POTASSIUM CHLORIDE ER PO Take by mouth. Takes 20 meq daily      . pravastatin (PRAVACHOL) 40 MG tablet Take 80 mg by mouth at bedtime.       Marland Kitchen QUEtiapine (SEROQUEL XR) 300 MG 24 hr tablet Take 1 tablet (300 mg total) by mouth at bedtime.  30 tablet  5  . QUEtiapine (SEROQUEL) 100 MG tablet Take 1 tablet (100 mg total) by mouth daily.  30 tablet  5  . sertraline (ZOLOFT) 100 MG tablet Take 1 tablet (100 mg total) by mouth daily.  30 tablet  5  . triamterene-hydrochlorothiazide (MAXZIDE-25) 37.5-25 MG per tablet Take 1 tablet by mouth daily.        . vitamin B-12 (CYANOCOBALAMIN) 100 MCG tablet Take 100 mcg by mouth daily.       Marland Kitchen zonisamide (ZONEGRAN) 100 MG capsule Take 400 mg by mouth at bedtime.              Sexual History:   History  Sexual Activity  . Sexually Active: No    Abuse/Trauma History: No abuse noted.   Psychiatric History:  Once inpatient at [facility] and somewhere in [place] for rehab. Clean since 2006.   Family Med/Psych History:  Family History  Problem Relation Age of Onset  . Coronary artery disease Father   . Hypertension Mother   . Diabetes Mother   . Schizophrenia Mother   . Depression Brother   . Prostate cancer Brother   . Anesthesia problems Neg Hx   . Hypotension Neg Hx   . Malignant hyperthermia Neg Hx   . Pseudochol deficiency Neg Hx     Risk of Suicide/Violence: virtually non-existent   Impression/DX:  Schizoaffective Disorder  Disposition/Plan:  Bi-weekly treatment to address grief and loss, management of anxiety,  panic attacks, depression and to develop improved coping skills with improved self care.   Diagnosis:    Axis I:  Schizoaffective Disorder      Axis II: Deferred       Axis III:  Chronic pain      Axis IV:  economic problems, problems related to social environment  and problems with primary support group          Axis V:  51-60 moderate symptoms

## 2012-01-08 ENCOUNTER — Encounter (HOSPITAL_COMMUNITY): Payer: Self-pay | Admitting: Licensed Clinical Social Worker

## 2012-01-08 ENCOUNTER — Ambulatory Visit (HOSPITAL_COMMUNITY): Payer: Self-pay | Admitting: Licensed Clinical Social Worker

## 2012-01-08 NOTE — Progress Notes (Signed)
Patient ID: Gloria Lewis, female   DOB: 25-Oct-1955, 56 y.o.   MRN: [id number] Patient is a no call and no show for appointment.

## 2012-02-19 ENCOUNTER — Ambulatory Visit (INDEPENDENT_AMBULATORY_CARE_PROVIDER_SITE_OTHER): Payer: Federal, State, Local not specified - Other | Admitting: Psychiatry

## 2012-02-19 DIAGNOSIS — F4323 Adjustment disorder with mixed anxiety and depressed mood: Secondary | ICD-10-CM

## 2012-02-19 MED ORDER — QUETIAPINE FUMARATE 100 MG PO TABS: 100.0000 mg | ORAL_TABLET | Freq: Every day | ORAL | Status: DC

## 2012-02-19 MED ORDER — QUETIAPINE FUMARATE ER 300 MG PO TB24: 300.0000 mg | ORAL_TABLET | Freq: Every day | ORAL | Status: DC

## 2012-02-19 MED ORDER — DIAZEPAM 5 MG PO TABS: ORAL_TABLET | ORAL | Status: DC

## 2012-02-19 NOTE — Progress Notes (Signed)
[facility] MD Progress Note  02/19/2012 9:22 AM Gloria Lewis  MRN:  [id number]  Diagnosis:  Axis I: Adjustment Disorder with Mixed Emotional Features Today the patient is seen and is doing better. She had 2 declines that are directly related to issues with her children. She has a daughter who lives in the community he does not have a place to live and I suspect is homeless. The daughter contacted the patient crying session they went to church and the daughter came home to the patient for a meal. The daughter is now staying with the patient. The patient's son who lives with her who has bipolar disorder when off his medicines last week but in the last few days his back on them. She describes an up-and-down last few weeks but for this past week she is better. She clearly has the insight to see that her kids are stable she is stable. At this time she denies daily depression. She continues to have a lower appetite than would be expected with weight loss. Her sleep is chronically impaired for years. Hence there is no difference in her sleep pattern. The patient has a good amount of energy and is not acidotic. She is able to think and concentrate well has a good sense of worth. She uses no alcohol or drugs. She is in good spirits today and positive. The patient is interested in starting in one-to-one talking treatment as she said she fired her last therapist. Maryclare Labrador go ahead and set her up for a therapist here.the patient is doing well in college. ADL's:  Intact  Sleep: Good  Appetite:  Good  Suicidal Ideation:  no Homicidal Ideation:  no  AEB (as evidenced by):  Mental Status Examination/Evaluation: Objective:  Appearance: Casual  Eye Contact::  Good  Speech:  Clear and Coherent  Volume:  Normal  Mood:  Euthymic  Affect:  Appropriate  Thought Process:  Coherent  Orientation:  Full  Thought Content:  WDL  Suicidal Thoughts:  No  Homicidal Thoughts:  No  Memory:  nl  Judgement:  Good  Insight:   Good  Psychomotor Activity:  Normal  Concentration:  Good  Recall:  Good  Akathisia:  No  Handed:  Right  AIMS (if indicated):     Assets:  Desire for Improvement  Sleep:      Vital Signs:There were no vitals taken for this visit. Current Medications: Current Outpatient Prescriptions  Medication Sig Dispense Refill  . Alum & Mag Hydroxide-Simeth (MAGIC MOUTHWASH) SOLN Take 5 mLs by mouth every 6 (six) hours as needed.  100 mL  0  . chlorproMAZINE (THORAZINE) 25 MG tablet Take 25 mg by mouth every 6 (six) hours as needed. Takes 1-2 tables every 4 hours PRN for migraine      . cyclobenzaprine (FLEXERIL) 10 MG tablet Take 1 tablet (10 mg total) by mouth 3 (three) times daily as needed. For muscle spasms  60 tablet  1  . diazepam (VALIUM) 5 MG tablet 1 qday PRN  20 tablet  5  . Menthol, Topical Analgesic, (ICY HOT BACK) 5 % PADS Apply 1 patch topically daily as needed. For back pain.      . Multiple Vitamins-Minerals (MULTIVITAMIN WITH MINERALS) tablet Take 1 tablet by mouth daily.        Marland Kitchen omeprazole (PRILOSEC) 40 MG capsule Take 80 mg by mouth daily.       Marland Kitchen POTASSIUM CHLORIDE ER PO Take by mouth. Takes 20 meq daily      .  pravastatin (PRAVACHOL) 40 MG tablet Take 80 mg by mouth at bedtime.       Marland Kitchen QUEtiapine (SEROQUEL XR) 300 MG 24 hr tablet Take 1 tablet (300 mg total) by mouth at bedtime.  30 tablet  5  . sertraline (ZOLOFT) 100 MG tablet Take 1 tablet (100 mg total) by mouth daily.  30 tablet  5  . triamterene-hydrochlorothiazide (MAXZIDE-25) 37.5-25 MG per tablet Take 1 tablet by mouth daily.        . vitamin B-12 (CYANOCOBALAMIN) 100 MCG tablet Take 100 mcg by mouth daily.       Marland Kitchen zonisamide (ZONEGRAN) 100 MG capsule Take 400 mg by mouth at bedtime.      . [DISCONTINUED] QUEtiapine (SEROQUEL) 100 MG tablet Take 1 tablet (100 mg total) by mouth daily.  30 tablet  5    Lab Results: No results found for this or any previous visit (from the past 48 hour(s)).  Physical  Findings: AIMS:  , ,  ,  ,    CIWA:    COWS:     Treatment Plan Summary:   Plan:at this time we reviewed the medications that she's been on it is evident that she is on Seroquel for reasons that are not clear. All mood disorder episode occurred in the context of drug and alcohol usage. She admitted that when she is not using drugs and alcohol which is the case for the last 5 years she doesn't really have clear episodes of disorders. She's never been psychotic. At this time we'll go ahead and begin titrating down her Seroquel. We'll discontinue the when necessary Seroquel. We will reduce her 300 mg XR to taking 150 mg XR every night. When her next visit we shall discontinue it completely. At this time she'll continue taking Zoloft and we will reinstate her Valium 5 mg when necessary. She claims she takes it rarely and only as needed. Today the patient will get an appointment with Hassan Rowan. The patient return to see me in 2 and half months. We therefore would like her to get established with a therapist before we've continue to reduce and stop her Seroquel.  Vonne Mcdanel IRVING 02/19/2012, 9:22 AM

## 2012-03-14 ENCOUNTER — Ambulatory Visit (HOSPITAL_COMMUNITY): Payer: Self-pay | Admitting: Licensed Clinical Social Worker

## 2012-03-14 ENCOUNTER — Encounter (HOSPITAL_COMMUNITY): Payer: Self-pay

## 2012-03-14 ENCOUNTER — Encounter (HOSPITAL_COMMUNITY): Payer: Self-pay | Admitting: Licensed Clinical Social Worker

## 2012-03-14 NOTE — Progress Notes (Signed)
Patient ID: Gloria Lewis, female   DOB: February 17, 1956, 56 y.o.   MRN: [id number] Patient was a no show, no call for appointment today.

## 2012-03-31 ENCOUNTER — Emergency Department (HOSPITAL_COMMUNITY): Payer: Medicaid Other

## 2012-03-31 ENCOUNTER — Emergency Department (HOSPITAL_COMMUNITY)
Admission: EM | Admit: 2012-03-31 | Discharge: 2012-03-31 | Disposition: A | Discharge status: Home / Self Care | Payer: Medicaid Other | Attending: Emergency Medicine | Admitting: Emergency Medicine

## 2012-03-31 ENCOUNTER — Encounter (HOSPITAL_COMMUNITY): Payer: Self-pay | Admitting: Emergency Medicine

## 2012-03-31 DIAGNOSIS — F319 Bipolar disorder, unspecified: Secondary | ICD-10-CM | POA: Insufficient documentation

## 2012-03-31 DIAGNOSIS — F3289 Other specified depressive episodes: Secondary | ICD-10-CM | POA: Insufficient documentation

## 2012-03-31 DIAGNOSIS — K219 Gastro-esophageal reflux disease without esophagitis: Secondary | ICD-10-CM | POA: Insufficient documentation

## 2012-03-31 DIAGNOSIS — Z8669 Personal history of other diseases of the nervous system and sense organs: Secondary | ICD-10-CM | POA: Insufficient documentation

## 2012-03-31 DIAGNOSIS — F411 Generalized anxiety disorder: Secondary | ICD-10-CM | POA: Insufficient documentation

## 2012-03-31 DIAGNOSIS — R109 Unspecified abdominal pain: Secondary | ICD-10-CM | POA: Insufficient documentation

## 2012-03-31 DIAGNOSIS — Z860100 Personal history of colon polyps, unspecified: Secondary | ICD-10-CM | POA: Insufficient documentation

## 2012-03-31 DIAGNOSIS — F17200 Nicotine dependence, unspecified, uncomplicated: Secondary | ICD-10-CM | POA: Insufficient documentation

## 2012-03-31 DIAGNOSIS — Z79899 Other long term (current) drug therapy: Secondary | ICD-10-CM | POA: Insufficient documentation

## 2012-03-31 DIAGNOSIS — I10 Essential (primary) hypertension: Secondary | ICD-10-CM | POA: Insufficient documentation

## 2012-03-31 DIAGNOSIS — F329 Major depressive disorder, single episode, unspecified: Secondary | ICD-10-CM | POA: Insufficient documentation

## 2012-03-31 DIAGNOSIS — R197 Diarrhea, unspecified: Secondary | ICD-10-CM | POA: Insufficient documentation

## 2012-03-31 DIAGNOSIS — Z8739 Personal history of other diseases of the musculoskeletal system and connective tissue: Secondary | ICD-10-CM | POA: Insufficient documentation

## 2012-03-31 DIAGNOSIS — F259 Schizoaffective disorder, unspecified: Secondary | ICD-10-CM | POA: Insufficient documentation

## 2012-03-31 DIAGNOSIS — Z8679 Personal history of other diseases of the circulatory system: Secondary | ICD-10-CM | POA: Insufficient documentation

## 2012-03-31 DIAGNOSIS — E7800 Pure hypercholesterolemia, unspecified: Secondary | ICD-10-CM | POA: Insufficient documentation

## 2012-03-31 LAB — URINALYSIS, ROUTINE W REFLEX MICROSCOPIC
Leukocytes, UA: NEGATIVE
Protein, ur: NEGATIVE mg/dL
Specific Gravity, Urine: 1.009 (ref 1.005–1.030)
Urobilinogen, UA: 0.2 mg/dL (ref 0.0–1.0)

## 2012-03-31 LAB — BASIC METABOLIC PANEL
CO2: 24 mEq/L (ref 19–32)
Calcium: 10 mg/dL (ref 8.4–10.5)
GFR calc non Af Amer: 71 mL/min — ABNORMAL LOW (ref >90)
Potassium: 3.6 mEq/L (ref 3.5–5.1)
Sodium: 137 mEq/L (ref 135–145)

## 2012-03-31 LAB — CBC
MCH: 29 pg (ref 26.0–34.0)
MCHC: 34.5 g/dL (ref 30.0–36.0)
Platelets: 379 10*3/uL (ref 150–400)
RBC: 4.55 MIL/uL (ref 3.87–5.11)

## 2012-03-31 MED ORDER — IBUPROFEN 600 MG PO TABS: 600.0000 mg | ORAL_TABLET | Freq: Three times a day (TID) | ORAL | Status: DC | PRN

## 2012-03-31 MED ORDER — ONDANSETRON 8 MG PO TBDP: 8.0000 mg | ORAL_TABLET | Freq: Three times a day (TID) | ORAL | Status: DC | PRN

## 2012-03-31 MED ORDER — SODIUM CHLORIDE 0.9 % IV SOLN
1000.0000 mL | Freq: Once | INTRAVENOUS | Status: AC
  Administered 2012-03-31: 1000 mL via INTRAVENOUS

## 2012-03-31 MED ORDER — SODIUM CHLORIDE 0.9 % IV SOLN: 1000.0000 mL | INTRAVENOUS | Status: DC

## 2012-03-31 MED ORDER — MORPHINE SULFATE 4 MG/ML IJ SOLN
4.0000 mg | Freq: Once | INTRAMUSCULAR | Status: AC
  Administered 2012-03-31: 4 mg via INTRAVENOUS
  Filled 2012-03-31: qty 1

## 2012-03-31 NOTE — ED Provider Notes (Signed)
History     CSN: [id number]  Arrival date & time 03/31/12  0917   First MD Initiated Contact with Patient 03/31/12 0932      Chief Complaint  Patient presents with  . Abdominal Pain  . Flank Pain     The history is provided by the patient.   the patient reports 5-6 days of diarrhea with some nausea.  No vomiting.  She states that ginger ale and saltines seem to be helping her nausea.  No recent sick contacts.  She denies melena and hematochezia.  She does report a colonoscopy within the past 5 years it was without abnormality the patient.  She has no history of IBS are also colitis or other bowel disorders.  She states her abdominal pain is crampy and generalized.  No urinary symptoms.  No nausea or vomiting at this time.  Her last loose stool was earlier today.  No recent antibiotics or travel outside the [place].  No history of C. difficile colitis.  Past Medical History  Diagnosis Date  . HTN (hypertension)   . Bipolar 1 disorder   . OA (osteoarthritis)   . GERD (gastroesophageal reflux disease)   . Hypercholesterolemia   . Fever blister   . HA (headache)   . Colon polyp   . CTS (carpal tunnel syndrome)   . Schizo-affective psychosis   . Anxiety   . Depression     Past Surgical History  Procedure Date  . Neck surgery 2009  . Carpal tunnel release 20110 rt/lt  . Polp removed 2011  . Back injection   . Pituitary surgery     Had gland removed from producing too much calcium  . Anterior cervical decomp/discectomy fusion 08/27/2011    Procedure: ANTERIOR CERVICAL DECOMPRESSION/DISCECTOMY FUSION 1 LEVEL/HARDWARE REMOVAL;  Surgeon: Tia Alert, MD;  Location: [facility];  Service: Neurosurgery;  Laterality: Bilateral;  Cervical four-five Anterior cervical decompression/diskectomy, fusion, Plate, Removal of Cervical five-seven Plate    Family History  Problem Relation Age of Onset  . Coronary artery disease Father   . Hypertension Mother   . Diabetes Mother   .  Schizophrenia Mother   . Depression Brother   . Prostate cancer Brother   . Anesthesia problems Neg Hx   . Hypotension Neg Hx   . Malignant hyperthermia Neg Hx   . Pseudochol deficiency Neg Hx     History  Substance Use Topics  . Smoking status: Current Every Day Smoker -- 0.2 packs/day for 30 years  . Smokeless tobacco: Not on file  . Alcohol Use: No    OB History    Grav Para Term Preterm Abortions TAB SAB Ect Mult Living                  Review of Systems  Gastrointestinal: Positive for abdominal pain.  Genitourinary: Positive for flank pain.  All other systems reviewed and are negative.    Allergies  Amoxicillin; Chantix; Effexor; Paroxetine hcl; Penicillins; Zithromax; and Hydrocodone  Home Medications   Current Outpatient Rx  Name  Route  Sig  Dispense  Refill  . CHLORPROMAZINE HCL 25 MG PO TABS   Oral   Take 25 mg by mouth every 6 (six) hours as needed. Takes 1-2 tables every 4 hours PRN for migraine         . CYCLOBENZAPRINE HCL 10 MG PO TABS   Oral   Take 1 tablet (10 mg total) by mouth 3 (three) times daily as needed.  For muscle spasms   60 tablet   1   . DIAZEPAM 5 MG PO TABS      1 qday PRN   20 tablet   5   . MULTI-VITAMIN/MINERALS PO TABS   Oral   Take 1 tablet by mouth daily.           Marland Kitchen OMEPRAZOLE 40 MG PO CPDR   Oral   Take 80 mg by mouth daily.          Marland Kitchen POTASSIUM CHLORIDE ER PO   Oral   Take by mouth. Takes 20 meq daily         . PRAVASTATIN SODIUM 40 MG PO TABS   Oral   Take 80 mg by mouth at bedtime.          Marland Kitchen QUETIAPINE FUMARATE ER 300 MG PO TB24   Oral   Take 150 mg by mouth at bedtime.         . SERTRALINE HCL 100 MG PO TABS   Oral   Take 1 tablet (100 mg total) by mouth daily.   30 tablet   5   . TRAMADOL HCL 50 MG PO TABS   Oral   Take 50 mg by mouth every 6 (six) hours as needed. pain         . TRIAMTERENE-HCTZ 37.5-25 MG PO TABS   Oral   Take 1 tablet by mouth every morning.          Marland Kitchen  VITAMIN B-12 100 MCG PO TABS   Oral   Take 100 mcg by mouth daily.          Marland Kitchen ZONISAMIDE 100 MG PO CAPS   Oral   Take 400 mg by mouth at bedtime.         Marland Kitchen MAGIC MOUTHWASH   Oral   Take 5 mLs by mouth every 6 (six) hours as needed.   100 mL   0     BP 123/79  Pulse 77  Temp 98.2 F (36.8 C) (Oral)  Resp 18  SpO2 100%  Physical Exam  Nursing note and vitals reviewed. Constitutional: She is oriented to person, place, and time. She appears well-developed and well-nourished. No distress.  HENT:  Head: Normocephalic and atraumatic.  Eyes: EOM are normal.  Neck: Normal range of motion.  Cardiovascular: Normal rate, regular rhythm and normal heart sounds.   Pulmonary/Chest: Effort normal and breath sounds normal.  Abdominal: Soft. She exhibits no distension. There is no tenderness. There is no rebound and no guarding.       Obese  Musculoskeletal: Normal range of motion.  Neurological: She is alert and oriented to person, place, and time.  Skin: Skin is warm and dry.  Psychiatric: She has a normal mood and affect. Judgment normal.    ED Course  Procedures (including critical care time)  Labs Reviewed  BASIC METABOLIC PANEL - Abnormal; Notable for the following:    GFR calc non Af Amer 71 (*)     GFR calc Af Amer 82 (*)     All other components within normal limits  CBC  URINALYSIS, ROUTINE W REFLEX MICROSCOPIC  STOOL CULTURE  CLOSTRIDIUM DIFFICILE BY PCR   Dg Abd 2 Views  03/31/2012  *RADIOLOGY REPORT*  Clinical Data: Abdominal pain and diarrhea.  ABDOMEN - 2 VIEW  Comparison: Lumbar spine radiographs 05/08/2011.  Findings: Supine and upright views of the abdomen demonstrate nonspecific bowel gas pattern.  There is no evidence for  obstruction or free air.  Degenerative facet changes are again noted within the lumbar spine.  IMPRESSION: Nonspecific bowel gas pattern without evidence for obstruction or free air.   Original Report Authenticated By: Marin Roberts, M.D.    I personally reviewed the imaging tests through PACS system I reviewed available ER/hospitalization records through the EMR   No diagnosis found.    MDM  11:56 AM Repeat abdominal exam is benign.  Vital signs are normal.  I doubt the patient has surgical abdominal pathology.  Close followup with her gastroenterologist as recommended for her ongoing diarrhea and generalized abdominal discomfort which sounds more colonic in nature.  Plain film without evidence of obstruction or free air. c diff pending. Home with GI follow up        Lyanne Co, MD 03/31/12 1158

## 2012-03-31 NOTE — ED Notes (Signed)
Pt states she has been having abd pain and pain to her L/R side. States she was nauseated x3 days, however yesterday 12/25 was the first day she was able to tolerated semi soft foods and liquids. No episodes of nausea however the pain is still present. Feels as tho she is about to have migraines as well. States she took her migraine meds prior to coming in. Pleasant and appropriate for current situation.

## 2012-03-31 NOTE — ED Notes (Signed)
Patient reports that she has had bilateral flank pain and abdominal pain x 2 -3 days. Also N/V/D that has resolved. The patient denies any burning with urination

## 2012-04-20 ENCOUNTER — Emergency Department (HOSPITAL_COMMUNITY)
Admission: EM | Admit: 2012-04-20 | Discharge: 2012-04-20 | Disposition: A | Discharge status: Home / Self Care | Payer: Medicaid Other | Attending: Emergency Medicine | Admitting: Emergency Medicine

## 2012-04-20 ENCOUNTER — Encounter (HOSPITAL_COMMUNITY): Payer: Self-pay | Admitting: Emergency Medicine

## 2012-04-20 ENCOUNTER — Ambulatory Visit (HOSPITAL_COMMUNITY): Payer: Self-pay | Admitting: Licensed Clinical Social Worker

## 2012-04-20 DIAGNOSIS — F3289 Other specified depressive episodes: Secondary | ICD-10-CM | POA: Insufficient documentation

## 2012-04-20 DIAGNOSIS — K219 Gastro-esophageal reflux disease without esophagitis: Secondary | ICD-10-CM | POA: Insufficient documentation

## 2012-04-20 DIAGNOSIS — F17200 Nicotine dependence, unspecified, uncomplicated: Secondary | ICD-10-CM | POA: Insufficient documentation

## 2012-04-20 DIAGNOSIS — Y9239 Other specified sports and athletic area as the place of occurrence of the external cause: Secondary | ICD-10-CM | POA: Insufficient documentation

## 2012-04-20 DIAGNOSIS — S9000XA Contusion of unspecified ankle, initial encounter: Secondary | ICD-10-CM | POA: Insufficient documentation

## 2012-04-20 DIAGNOSIS — F319 Bipolar disorder, unspecified: Secondary | ICD-10-CM | POA: Insufficient documentation

## 2012-04-20 DIAGNOSIS — Y92838 Other recreation area as the place of occurrence of the external cause: Secondary | ICD-10-CM | POA: Insufficient documentation

## 2012-04-20 DIAGNOSIS — R51 Headache: Secondary | ICD-10-CM

## 2012-04-20 DIAGNOSIS — Z8739 Personal history of other diseases of the musculoskeletal system and connective tissue: Secondary | ICD-10-CM | POA: Insufficient documentation

## 2012-04-20 DIAGNOSIS — Z860100 Personal history of colon polyps, unspecified: Secondary | ICD-10-CM | POA: Insufficient documentation

## 2012-04-20 DIAGNOSIS — T148XXA Other injury of unspecified body region, initial encounter: Secondary | ICD-10-CM

## 2012-04-20 DIAGNOSIS — Y9389 Activity, other specified: Secondary | ICD-10-CM | POA: Insufficient documentation

## 2012-04-20 DIAGNOSIS — I10 Essential (primary) hypertension: Secondary | ICD-10-CM | POA: Insufficient documentation

## 2012-04-20 DIAGNOSIS — E7800 Pure hypercholesterolemia, unspecified: Secondary | ICD-10-CM | POA: Insufficient documentation

## 2012-04-20 DIAGNOSIS — F329 Major depressive disorder, single episode, unspecified: Secondary | ICD-10-CM | POA: Insufficient documentation

## 2012-04-20 DIAGNOSIS — X500XXA Overexertion from strenuous movement or load, initial encounter: Secondary | ICD-10-CM | POA: Insufficient documentation

## 2012-04-20 DIAGNOSIS — G43909 Migraine, unspecified, not intractable, without status migrainosus: Secondary | ICD-10-CM | POA: Insufficient documentation

## 2012-04-20 DIAGNOSIS — Z79899 Other long term (current) drug therapy: Secondary | ICD-10-CM | POA: Insufficient documentation

## 2012-04-20 DIAGNOSIS — F411 Generalized anxiety disorder: Secondary | ICD-10-CM | POA: Insufficient documentation

## 2012-04-20 HISTORY — DX: Migraine, unspecified, not intractable, without status migrainosus: G43.909

## 2012-04-20 LAB — POCT I-STAT, CHEM 8
BUN: 4 mg/dL — ABNORMAL LOW (ref 6–23)
Chloride: 107 mEq/L (ref 96–112)
Potassium: 3.4 mEq/L — ABNORMAL LOW (ref 3.5–5.1)
Sodium: 141 mEq/L (ref 135–145)
TCO2: 21 mmol/L (ref 0–100)

## 2012-04-20 LAB — CBC WITH DIFFERENTIAL/PLATELET
Basophils Absolute: 0 10*3/uL (ref 0.0–0.1)
Lymphocytes Relative: 39 % (ref 12–46)
Neutro Abs: 3.2 10*3/uL (ref 1.7–7.7)
Neutrophils Relative %: 51 % (ref 43–77)
Platelets: 348 10*3/uL (ref 150–400)
RBC: 4.65 MIL/uL (ref 3.87–5.11)
RDW: 14.8 % (ref 11.5–15.5)
WBC: 6.3 10*3/uL (ref 4.0–10.5)

## 2012-04-20 MED ORDER — HYDROMORPHONE HCL PF 2 MG/ML IJ SOLN
2.0000 mg | Freq: Once | INTRAMUSCULAR | Status: AC
  Administered 2012-04-20: 2 mg via INTRAMUSCULAR
  Filled 2012-04-20: qty 1

## 2012-04-20 MED ORDER — METOCLOPRAMIDE HCL 5 MG/ML IJ SOLN
10.0000 mg | Freq: Once | INTRAMUSCULAR | Status: AC
  Administered 2012-04-20: 10 mg via INTRAMUSCULAR
  Filled 2012-04-20: qty 2

## 2012-04-20 NOTE — ED Notes (Signed)
Pt states that she has had a migraine since the day after Christmas.  States that [facility] but it has not gotten any better.  Also c/o bilateral leg pain w/o injury.  Large bruise noted to upper right thigh but pt states that she does not remember injuring it.

## 2012-04-20 NOTE — ED Provider Notes (Signed)
History     CSN: [id number]  Arrival date & time 04/20/12  1023   First MD Initiated Contact with Patient 04/20/12 1041      Chief Complaint  Patient presents with  . Migraine     HPI Pt has been having a migraine headache since the end of December.   She has history of recurrent migraine headaches.  She [facility].  He told her to increase her muscle relaxant medications but it has not helped.  She last saw him last month but has not seen him again.  The headache is in the front of her head.  Constant, not relieved.  Nothing seems to help it out.  No fevers, no recent falls.   Past Medical History  Diagnosis Date  . HTN (hypertension)   . Bipolar 1 disorder   . OA (osteoarthritis)   . GERD (gastroesophageal reflux disease)   . Hypercholesterolemia   . Fever blister   . HA (headache)   . Colon polyp   . CTS (carpal tunnel syndrome)   . Schizo-affective psychosis   . Anxiety   . Depression   . Migraines     Past Surgical History  Procedure Date  . Neck surgery 2009  . Carpal tunnel release 20110 rt/lt  . Polp removed 2011  . Back injection   . Pituitary surgery     Had gland removed from producing too much calcium  . Anterior cervical decomp/discectomy fusion 08/27/2011    Procedure: ANTERIOR CERVICAL DECOMPRESSION/DISCECTOMY FUSION 1 LEVEL/HARDWARE REMOVAL;  Surgeon: Tia Alert, MD;  Location: [facility];  Service: Neurosurgery;  Laterality: Bilateral;  Cervical four-five Anterior cervical decompression/diskectomy, fusion, Plate, Removal of Cervical five-seven Plate    Family History  Problem Relation Age of Onset  . Coronary artery disease Father   . Hypertension Mother   . Diabetes Mother   . Schizophrenia Mother   . Depression Brother   . Prostate cancer Brother   . Anesthesia problems Neg Hx   . Hypotension Neg Hx   . Malignant hyperthermia Neg Hx   . Pseudochol deficiency Neg Hx     History  Substance Use  Topics  . Smoking status: Current Every Day Smoker -- 0.2 packs/day for 30 years  . Smokeless tobacco: Not on file  . Alcohol Use: No    OB History    Grav Para Term Preterm Abortions TAB SAB Ect Mult Living                  Review of Systems  All other systems reviewed and are negative.    Allergies  Amoxicillin; Chantix; Effexor; Paroxetine hcl; Penicillins; Zithromax; and Hydrocodone  Home Medications   Current Outpatient Rx  Name  Route  Sig  Dispense  Refill  . CHLORPROMAZINE HCL 25 MG PO TABS   Oral   Take 25 mg by mouth every 6 (six) hours as needed. Takes 1-2 tables every 4 hours PRN for migraine         . CYCLOBENZAPRINE HCL 10 MG PO TABS   Oral   Take 1 tablet (10 mg total) by mouth 3 (three) times daily as needed. For muscle spasms   60 tablet   1   . DIAZEPAM 5 MG PO TABS      1 qday PRN   20 tablet   5   . MULTI-VITAMIN/MINERALS PO TABS   Oral   Take 1 tablet by mouth  daily.           Marland Kitchen OMEPRAZOLE 40 MG PO CPDR   Oral   Take 80 mg by mouth daily.          Marland Kitchen ONDANSETRON 8 MG PO TBDP   Oral   Take 1 tablet (8 mg total) by mouth every 8 (eight) hours as needed for nausea.   12 tablet   0   . PRAVASTATIN SODIUM 40 MG PO TABS   Oral   Take 80 mg by mouth at bedtime.          Marland Kitchen QUETIAPINE FUMARATE ER 300 MG PO TB24   Oral   Take 150 mg by mouth at bedtime.         . SERTRALINE HCL 100 MG PO TABS   Oral   Take 1 tablet (100 mg total) by mouth daily.   30 tablet   5   . TRAMADOL HCL 50 MG PO TABS   Oral   Take 50 mg by mouth every 6 (six) hours as needed. pain         . TRIAMTERENE-HCTZ 37.5-25 MG PO TABS   Oral   Take 1 tablet by mouth every morning.          Marland Kitchen VITAMIN B-12 100 MCG PO TABS   Oral   Take 100 mcg by mouth daily.          Marland Kitchen ZONISAMIDE 100 MG PO CAPS   Oral   Take 400 mg by mouth at bedtime. For migraine         . MAGIC MOUTHWASH   Oral   Take 5 mLs by mouth every 6 (six) hours as needed.   100  mL   0     BP 114/75  Pulse 85  Temp 98.3 F (36.8 C) (Oral)  Resp 20  SpO2 99%  Physical Exam  Nursing note and vitals reviewed. Constitutional: She appears well-developed and well-nourished. No distress.  HENT:  Head: Normocephalic and atraumatic.  Right Ear: External ear normal.  Left Ear: External ear normal.  Eyes: Conjunctivae normal are normal. Pupils are equal, round, and reactive to light. Right eye exhibits no discharge. Left eye exhibits no discharge. No scleral icterus.  Neck: Normal range of motion. Neck supple. No tracheal deviation present.  Cardiovascular: Normal rate, regular rhythm and intact distal pulses.   Pulmonary/Chest: Effort normal and breath sounds normal. No stridor. No respiratory distress. She has no wheezes. She has no rales.  Abdominal: Soft. Bowel sounds are normal. She exhibits no distension. There is no tenderness. There is no rebound and no guarding.  Musculoskeletal: She exhibits no edema and no tenderness.       Linear bruise right thigh  Lymphadenopathy:    She has no cervical adenopathy.  Neurological: She is alert. She has normal strength. No sensory deficit. Cranial nerve deficit:  no gross defecits noted. She exhibits normal muscle tone. She displays no seizure activity. Coordination normal.  Skin: Skin is warm and dry. No rash noted.  Psychiatric: She has a normal mood and affect.    ED Course  Procedures (including critical care time)  Labs Reviewed  POCT I-STAT, CHEM 8 - Abnormal; Notable for the following:    Potassium 3.4 (*)     BUN 4 (*)     All other components within normal limits  CBC WITH DIFFERENTIAL   No results found.   1. Headache   2. Contusion  MDM  Headache Pt has chronic recurrent headaches.  No sign of acute emergency medical condition.  Doubt meningitis, or other acute neurological emergency.  Recc she follow up with her headache doctor.  She stated that her next appt was not for another 6  months.  I suggested she call for today to arrange a close follow up appointment to discuss further treatment options  Leg aches, bruise No sign of acute electrolyte abnormalities.  No infection.  No swelling.  Doubt DVT. Labs do not show anemia or thrombocytopenia. Suggest outpt follow up.  No acute emergency medical condition.        Celene Kras, MD 04/20/12 [phone]

## 2012-04-20 NOTE — ED Notes (Addendum)
ALL VITALS PRIOR TO THIS WERE ON THE WRONG PERSON.

## 2012-05-04 ENCOUNTER — Ambulatory Visit (HOSPITAL_COMMUNITY): Payer: Self-pay | Admitting: Licensed Clinical Social Worker

## 2012-05-11 ENCOUNTER — Ambulatory Visit (INDEPENDENT_AMBULATORY_CARE_PROVIDER_SITE_OTHER): Payer: Federal, State, Local not specified - Other | Admitting: Psychiatry

## 2012-05-11 DIAGNOSIS — F331 Major depressive disorder, recurrent, moderate: Secondary | ICD-10-CM

## 2012-05-11 DIAGNOSIS — F339 Major depressive disorder, recurrent, unspecified: Secondary | ICD-10-CM

## 2012-05-11 DIAGNOSIS — F332 Major depressive disorder, recurrent severe without psychotic features: Secondary | ICD-10-CM

## 2012-05-11 MED ORDER — QUETIAPINE FUMARATE ER 50 MG PO TB24: 50.0000 mg | ORAL_TABLET | Freq: Every day | ORAL | Status: DC

## 2012-05-11 MED ORDER — DIAZEPAM 5 MG PO TABS: ORAL_TABLET | ORAL | Status: DC

## 2012-05-11 MED ORDER — SERTRALINE HCL 100 MG PO TABS: 100.0000 mg | ORAL_TABLET | Freq: Every day | ORAL | Status: DC

## 2012-05-11 NOTE — Progress Notes (Signed)
[facility] MD Progress Note  05/11/2012 3:56 PM Gloria Lewis  MRN:  [id number] Subjective:  Exhausted Diagnosis:  Axis I: Major Depression, Recurrent severe Today the patient now is that she was feeling somewhat depressed in the last month. 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 She's trying to take care of them and yet she's been suffering over the last month or 2 with severe migraine headaches. She's involved with the headache clinic and is taking multiple medications. At this time the patient cannot go back to school because she feels too afflicted. She is starting in therapy here on the 11th of this month. For some reason the patient says the Zoloft at times make sure oversedated. She also has had a problem taking the Valium on rare when necessary basis. At this time the patient is eating well. She is trying to exercise. I believe her sleep is better. She's not suicidal. Patient enjoys reading watching TV and her 2 dogs. I think she's had a rough time over the holidays and dealing with her meds we'll children. We talked about the importance of anatomy and the mental health Association in the community. She is tentatively been involved with both of them. ADL's:  Intact  Sleep: Good  Appetite:  Good  Suicidal Ideation:  no Homicidal Ideation:  none AEB (as evidenced by):  Psychiatric Specialty Exam: ROS  There were no vitals taken for this visit.There is no height or weight on file to calculate BMI.  General Appearance: Casual  Eye Contact::  Good  Speech:  Clear and Coherent  Volume:  Normal  Mood:  Euthymic  Affect:  Appropriate  Thought Process:  Goal Directed  Orientation:  Full (Time, Place, and Person)  Thought Content:  WDL  Suicidal Thoughts:  No  Homicidal Thoughts:  No  Memory:  good  Judgement:  Good  Insight:  Good  Psychomotor Activity:  Normal  Concentration:  Good  Recall:  Good  Akathisia:  No  Handed:  Right  AIMS (if  indicated):     Assets:  Desire for Improvement  Sleep:      Current Medications: Current Outpatient Prescriptions  Medication Sig Dispense Refill  . Alum & Mag Hydroxide-Simeth (MAGIC MOUTHWASH) SOLN Take 5 mLs by mouth every 6 (six) hours as needed.  100 mL  0  . chlorproMAZINE (THORAZINE) 25 MG tablet Take 25 mg by mouth every 6 (six) hours as needed. Takes 1-2 tables every 4 hours PRN for migraine      . cyclobenzaprine (FLEXERIL) 10 MG tablet Take 1 tablet (10 mg total) by mouth 3 (three) times daily as needed. For muscle spasms  60 tablet  1  . diazepam (VALIUM) 5 MG tablet 1 qday PRN  30 tablet  5  . Multiple Vitamins-Minerals (MULTIVITAMIN WITH MINERALS) tablet Take 1 tablet by mouth daily.        Marland Kitchen omeprazole (PRILOSEC) 40 MG capsule Take 80 mg by mouth daily.       . ondansetron (ZOFRAN ODT) 8 MG disintegrating tablet Take 1 tablet (8 mg total) by mouth every 8 (eight) hours as needed for nausea.  12 tablet  0  . pravastatin (PRAVACHOL) 40 MG tablet Take 80 mg by mouth at bedtime.       Marland Kitchen QUEtiapine (SEROQUEL XR) 50 MG TB24 Take 1 tablet (50 mg total) by mouth at bedtime.  30 tablet  5  . sertraline (ZOLOFT) 100 MG tablet Take  1 tablet (100 mg total) by mouth daily.  30 tablet  5  . traMADol (ULTRAM) 50 MG tablet Take 50 mg by mouth every 6 (six) hours as needed. pain      . triamterene-hydrochlorothiazide (MAXZIDE-25) 37.5-25 MG per tablet Take 1 tablet by mouth every morning.       . vitamin B-12 (CYANOCOBALAMIN) 100 MCG tablet Take 100 mcg by mouth daily.       Marland Kitchen zonisamide (ZONEGRAN) 100 MG capsule Take 400 mg by mouth at bedtime. For migraine        Lab Results: No results found for this or any previous visit (from the past 48 hour(s)).  Physical Findings: AIMS:  , ,  ,  ,    CIWA:    COWS:     Treatment Plan Summary: At this time will go ahead with our plan and slowly continue reducing her Seroquel. We will go from 150 mg to taking a 50 mg pill at night. It is my hope  that in the next few months we'll discontinue it completely. I've asked the patient to continue taking the Zoloft but to take it rarely had to change it to taking it at night. I also want her to get a consistent dose of Valium [place] and change it to taking 5 mg every night with no extra. Strongly encourage her to keep her appointment with a therapist here on the 11th of this month. Patient return to see me in 2 months.  Plan:  Medical Decision Making Problem Points:  Established problem, stable/improving (1) Data Points:  Review of new medications or change in dosage (2)  I certify that inpatient services furnished can reasonably be expected to improve the patient's condition.   Gloria Lewis 05/11/2012, 3:56 PM

## 2012-05-16 ENCOUNTER — Other Ambulatory Visit (HOSPITAL_COMMUNITY): Payer: Self-pay | Admitting: *Deleted

## 2012-05-16 DIAGNOSIS — F331 Major depressive disorder, recurrent, moderate: Secondary | ICD-10-CM

## 2012-05-16 MED ORDER — QUETIAPINE FUMARATE ER 50 MG PO TB24: 50.0000 mg | ORAL_TABLET | Freq: Every day | ORAL | Status: DC

## 2012-05-16 MED ORDER — SERTRALINE HCL 100 MG PO TABS: 100.0000 mg | ORAL_TABLET | Freq: Every day | ORAL | Status: DC

## 2012-05-16 NOTE — Telephone Encounter (Signed)
Received faxed refill requests for Quetiapine Fumarate 100 mg at bedtime and Sertraline 100 mg daily.Contacted pt-informed her RX sent 05/11/12 to [facility].Pt requested to use [facility] as pharmacy, NOT [facility].  Contacted [facility] on [phone] to ask that Seroquel and Sertraline be discontinued as pt uses a mail order pharmacy. Benita at [facility] stated the RXs would be deleted. RX for Quetiapine  XR 50 mg at bedtime and Sertraline 100 mg daily sent to [facility] by surescripts

## 2012-05-17 ENCOUNTER — Ambulatory Visit (HOSPITAL_COMMUNITY): Payer: Self-pay | Admitting: Licensed Clinical Social Worker

## 2012-05-26 ENCOUNTER — Ambulatory Visit (INDEPENDENT_AMBULATORY_CARE_PROVIDER_SITE_OTHER): Payer: Federal, State, Local not specified - Other | Admitting: Licensed Clinical Social Worker

## 2012-05-26 DIAGNOSIS — F411 Generalized anxiety disorder: Secondary | ICD-10-CM

## 2012-05-26 DIAGNOSIS — F259 Schizoaffective disorder, unspecified: Secondary | ICD-10-CM | POA: Insufficient documentation

## 2012-05-26 NOTE — Progress Notes (Signed)
   THERAPIST PROGRESS NOTE  Session Time: 8:30am-9:20am  Participation Level: Active  Behavioral Response: Well GroomedAlertEuthymic  Type of Therapy: Individual Therapy  Treatment Goals addressed: Anxiety and Coping  Interventions: CBT, Solution Focused, Strength-based, Supportive and Reframing  Summary: Gloria Lewis is a 57 y.o. female who presents with euthymic mood and bright affect. She reports that she has been doing well, managing multiple stressors in her life. She does struggle with sleep inconsistency, as well as, anxiety, which is not completed managed with medication. She does try to calm herself down with exercise, deep breathing techniques, hot baths and cognitive reframing. She is frustrated with her children and the stress they bring to her life. She had to kick her daughter out of the home for disrespecting the rules. She struggles with her chronically, mentally ill son and a realistic understanding of his capacity. She is enjoying school and does a good job with her self care, exercising three times per week. She denies any active psychosis or paranoia.    Suicidal/Homicidal: Nowithout intent/plan  Therapist Response: Assessed patients current functioning and reviewed progress. Reviewed coping strategies. Assessed patients safety and assisted in identifying protective factors.  Reviewed crisis plan with patient. Assisted patient with the expression of her feelings of frustration. Used CBT to assist patient with the identification of negative distortions and irrational thoughts. Encouraged patient to verbalize alternative and factual responses which challenge thought distortions. Provided feedback regarding her expectations of her sick son in order to develop a more realistic understanding of his capacity to decrease her stress. Used DBT to practice mindfulness, review distraction list and improve distress tolerance skills. Reviewed patients self care plan. Assessed  progress  related to self care. Patient's self care is good. Recommend proper diet, regular exercise, socialization and recreation.   Plan: Return again in two to three weeks.  Diagnosis: Axis I: Schizoaffective Disorder    Axis II: No diagnosis    Everlynn Sagun, LCSW 05/26/2012

## 2012-06-27 ENCOUNTER — Encounter (HOSPITAL_COMMUNITY): Payer: Self-pay | Admitting: Licensed Clinical Social Worker

## 2012-06-27 ENCOUNTER — Ambulatory Visit (HOSPITAL_COMMUNITY): Payer: Self-pay | Admitting: Licensed Clinical Social Worker

## 2012-06-27 NOTE — Progress Notes (Signed)
Patient ID: Gloria Lewis, female   DOB: [birth year], 56 y.o.   MRN: [id number] Patient cancelled late due to illness.

## 2012-06-27 NOTE — Progress Notes (Signed)
Patient ID: Gloria Lewis, female   DOB: 1955/05/04, 57 y.o.   MRN: [id number] Discharging patient from therapy services due to three no shows/late cancellations. Will inform patient.

## 2012-07-01 ENCOUNTER — Telehealth (HOSPITAL_COMMUNITY): Payer: Self-pay

## 2012-07-01 NOTE — Telephone Encounter (Signed)
11:29am 07/01/12 Recd call from pt stating that she was upset due to the termination letter see recd from her therapist - pt also stated that she was sick over the week-end explained to the pt that the procedures are if you call-out the day of the appt -  it's the providers decision to charge or not charge.  During the conversation the pt stated that she had call sometime last week and stated that she wasn't coming back to see her therapist.   Pt not happy about her $50 charge gave pt customer service # for billing/sh

## 2012-07-05 ENCOUNTER — Encounter (HOSPITAL_COMMUNITY): Payer: Self-pay | Admitting: *Deleted

## 2012-07-05 ENCOUNTER — Emergency Department (HOSPITAL_COMMUNITY)
Admission: EM | Admit: 2012-07-05 | Discharge: 2012-07-05 | Disposition: A | Discharge status: Home / Self Care | Payer: Medicaid Other | Attending: Emergency Medicine | Admitting: Emergency Medicine

## 2012-07-05 DIAGNOSIS — Z79899 Other long term (current) drug therapy: Secondary | ICD-10-CM | POA: Insufficient documentation

## 2012-07-05 DIAGNOSIS — E669 Obesity, unspecified: Secondary | ICD-10-CM | POA: Insufficient documentation

## 2012-07-05 DIAGNOSIS — J3489 Other specified disorders of nose and nasal sinuses: Secondary | ICD-10-CM | POA: Insufficient documentation

## 2012-07-05 DIAGNOSIS — R197 Diarrhea, unspecified: Secondary | ICD-10-CM | POA: Insufficient documentation

## 2012-07-05 DIAGNOSIS — R609 Edema, unspecified: Secondary | ICD-10-CM | POA: Insufficient documentation

## 2012-07-05 DIAGNOSIS — R5383 Other fatigue: Secondary | ICD-10-CM | POA: Insufficient documentation

## 2012-07-05 DIAGNOSIS — Z8739 Personal history of other diseases of the musculoskeletal system and connective tissue: Secondary | ICD-10-CM | POA: Insufficient documentation

## 2012-07-05 DIAGNOSIS — Z8669 Personal history of other diseases of the nervous system and sense organs: Secondary | ICD-10-CM | POA: Insufficient documentation

## 2012-07-05 DIAGNOSIS — F319 Bipolar disorder, unspecified: Secondary | ICD-10-CM | POA: Insufficient documentation

## 2012-07-05 DIAGNOSIS — Z860100 Personal history of colon polyps, unspecified: Secondary | ICD-10-CM | POA: Insufficient documentation

## 2012-07-05 DIAGNOSIS — F411 Generalized anxiety disorder: Secondary | ICD-10-CM | POA: Insufficient documentation

## 2012-07-05 DIAGNOSIS — I10 Essential (primary) hypertension: Secondary | ICD-10-CM | POA: Insufficient documentation

## 2012-07-05 DIAGNOSIS — E7800 Pure hypercholesterolemia, unspecified: Secondary | ICD-10-CM | POA: Insufficient documentation

## 2012-07-05 DIAGNOSIS — G43909 Migraine, unspecified, not intractable, without status migrainosus: Secondary | ICD-10-CM | POA: Insufficient documentation

## 2012-07-05 DIAGNOSIS — F17200 Nicotine dependence, unspecified, uncomplicated: Secondary | ICD-10-CM | POA: Insufficient documentation

## 2012-07-05 DIAGNOSIS — R52 Pain, unspecified: Secondary | ICD-10-CM | POA: Insufficient documentation

## 2012-07-05 DIAGNOSIS — K219 Gastro-esophageal reflux disease without esophagitis: Secondary | ICD-10-CM | POA: Insufficient documentation

## 2012-07-05 DIAGNOSIS — F259 Schizoaffective disorder, unspecified: Secondary | ICD-10-CM | POA: Insufficient documentation

## 2012-07-05 DIAGNOSIS — R110 Nausea: Secondary | ICD-10-CM | POA: Insufficient documentation

## 2012-07-05 DIAGNOSIS — R42 Dizziness and giddiness: Secondary | ICD-10-CM | POA: Insufficient documentation

## 2012-07-05 DIAGNOSIS — R5381 Other malaise: Secondary | ICD-10-CM | POA: Insufficient documentation

## 2012-07-05 LAB — CBC WITH DIFFERENTIAL/PLATELET
Eosinophils Relative: 3 % (ref 0–5)
Hemoglobin: 12.3 g/dL (ref 12.0–15.0)
Lymphocytes Relative: 33 % (ref 12–46)
Lymphs Abs: 3.1 10*3/uL (ref 0.7–4.0)
MCV: 86.7 fL (ref 78.0–100.0)
Platelets: 337 10*3/uL (ref 150–400)
RBC: 4.21 MIL/uL (ref 3.87–5.11)
WBC: 9.2 10*3/uL (ref 4.0–10.5)

## 2012-07-05 LAB — COMPREHENSIVE METABOLIC PANEL
ALT: 9 U/L (ref 0–35)
Alkaline Phosphatase: 100 U/L (ref 39–117)
CO2: 27 mEq/L (ref 19–32)
Calcium: 9.5 mg/dL (ref 8.4–10.5)
GFR calc Af Amer: 70 mL/min — ABNORMAL LOW (ref >90)
GFR calc non Af Amer: 61 mL/min — ABNORMAL LOW (ref >90)
Glucose, Bld: 86 mg/dL (ref 70–99)
Potassium: 3.5 mEq/L (ref 3.5–5.1)
Sodium: 140 mEq/L (ref 135–145)

## 2012-07-05 LAB — URINALYSIS, ROUTINE W REFLEX MICROSCOPIC
Bilirubin Urine: NEGATIVE
Hgb urine dipstick: NEGATIVE
Ketones, ur: NEGATIVE mg/dL
Protein, ur: NEGATIVE mg/dL
Urobilinogen, UA: 2 mg/dL — ABNORMAL HIGH (ref 0.0–1.0)

## 2012-07-05 MED ORDER — MORPHINE SULFATE 4 MG/ML IJ SOLN
4.0000 mg | Freq: Once | INTRAMUSCULAR | Status: AC
  Administered 2012-07-05: 4 mg via INTRAVENOUS
  Filled 2012-07-05: qty 1

## 2012-07-05 MED ORDER — SODIUM CHLORIDE 0.9 % IV BOLUS (SEPSIS)
500.0000 mL | Freq: Once | INTRAVENOUS | Status: AC
  Administered 2012-07-05: 500 mL via INTRAVENOUS

## 2012-07-05 MED ORDER — ONDANSETRON HCL 4 MG/2ML IJ SOLN
4.0000 mg | Freq: Once | INTRAMUSCULAR | Status: AC
  Administered 2012-07-05: 4 mg via INTRAVENOUS
  Filled 2012-07-05: qty 2

## 2012-07-05 NOTE — ED Notes (Signed)
Pt states her pain has eased off a bit. States she is having pain in head and back. Pt resting quietly, watching TV.

## 2012-07-05 NOTE — ED Notes (Signed)
Pt reports generalized body aches and stst hx of diarrhea x 3 months. Pt reports going to GI doctor, and taking probiotics which helped for a while but now is getting worse again. Pt also reports abdominal pain and nausea.

## 2012-07-05 NOTE — ED Provider Notes (Signed)
History     CSN: [id number]  Arrival date & time 07/05/12  1247   First MD Initiated Contact with Patient 07/05/12 1318      Chief Complaint  Patient presents with  . Diarrhea  . Generalized Body Aches    (Consider location/radiation/quality/duration/timing/severity/associated sxs/prior treatment) HPI Comments: Patient with multiple complaints, history of parathyroid surgery -- presents with complaint of diarrhea that began 3-4 months ago. She also complains of generalized body aches and "feeling bad". Patient has nonbloody 'lumpy' stools mixed with water after eating. She has seen her PCP who placed the patient on probiotics which has helped. She followed up with her gastroenterologist who agreed with probiotics and states that she may need colonoscopy if symptoms do not improve. Patient has a PCP appointment in one week for "full workup". Patient comes in today because symptoms are getting worse and she was feeling generally worse. She also states that her "balance is off". Patient denies fever. She has had intermittent nasal congestion for the past month. No significant chest pain or shortness of breath. No abdominal pain. No urinary symptoms. Patient describes feeling generally weak in her extremities does not have any unilateral weakness. She has not had syncope and does not feel lightheaded. She has not fallen. Onset of symptoms gradual. Course is constant. Nothing makes symptoms worse.  Patient is a 57 y.o. female presenting with diarrhea. The history is provided by the patient.  Diarrhea Associated symptoms: no abdominal pain, no fever, no headaches, no myalgias and no vomiting     Past Medical History  Diagnosis Date  . HTN (hypertension)   . Bipolar 1 disorder   . OA (osteoarthritis)   . GERD (gastroesophageal reflux disease)   . Hypercholesterolemia   . Fever blister   . HA (headache)   . Colon polyp   . CTS (carpal tunnel syndrome)   . Schizo-affective psychosis   .  Anxiety   . Depression   . Migraines     Past Surgical History  Procedure Laterality Date  . Neck surgery  2009  . Carpal tunnel release  20110 rt/lt  . Polp removed  2011  . Back injection    . 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 Anterior cervical decomp/discectomy fusion  08/27/2011    Procedure: ANTERIOR CERVICAL DECOMPRESSION/DISCECTOMY FUSION 1 LEVEL/HARDWARE REMOVAL;  Surgeon: Tia Alert, MD;  Location: [facility];  Service: Neurosurgery;  Laterality: Bilateral;  Cervical four-five Anterior cervical decompression/diskectomy, fusion, Plate, Removal of Cervical five-seven Plate    Family History  Problem Relation Age of Onset  . Coronary artery disease Father   . Hypertension Mother   . Diabetes Mother   . Schizophrenia Mother   . Depression Brother   . Prostate cancer Brother   . Anesthesia problems Neg Hx   . Hypotension Neg Hx   . Malignant hyperthermia Neg Hx   . Pseudochol deficiency Neg Hx     History  Substance Use Topics  . Smoking status: Current Every Day Smoker -- 0.25 packs/day for 30 years  . Smokeless tobacco: Not on file  . Alcohol Use: No    OB History   Grav Para Term Preterm Abortions TAB SAB Ect Mult Living                  Review of Systems  Constitutional: Positive for fatigue. Negative for fever.  HENT: Positive for congestion. Negative for sore throat and  rhinorrhea.   Eyes: Negative for redness.  Respiratory: Negative for cough.   Cardiovascular: Negative for chest pain.  Gastrointestinal: Positive for nausea and diarrhea. Negative for vomiting, abdominal pain and blood in stool.  Genitourinary: Negative for dysuria.  Musculoskeletal: Negative for myalgias.  Skin: Negative for rash.  Neurological: Positive for dizziness. Negative for light-headedness and headaches.    Allergies  Amoxicillin; Chantix; Effexor; Paroxetine hcl; Penicillins; Zithromax; and Hydrocodone  Home Medications    Current Outpatient Rx  Name  Route  Sig  Dispense  Refill  . cyclobenzaprine (FLEXERIL) 10 MG tablet   Oral   Take 1 tablet (10 mg total) by mouth 3 (three) times daily as needed. For muscle spasms   60 tablet   1   . diazepam (VALIUM) 5 MG tablet   Oral   Take 5 mg by mouth daily as needed for anxiety.         . Multiple Vitamins-Minerals (MULTIVITAMIN WITH MINERALS) tablet   Oral   Take 1 tablet by mouth daily.           Marland Kitchen omeprazole (PRILOSEC) 40 MG capsule   Oral   Take 40 mg by mouth 2 (two) times daily.          . pravastatin (PRAVACHOL) 40 MG tablet   Oral   Take 80 mg by mouth at bedtime.          Marland Kitchen QUEtiapine (SEROQUEL XR) 50 MG TB24   Oral   Take 1 tablet (50 mg total) by mouth at bedtime.   30 tablet   5     Please note dosage and type changed from previous  ...   . sertraline (ZOLOFT) 100 MG tablet   Oral   Take 1 tablet (100 mg total) by mouth daily.   30 tablet   5     Please send all future refill request electronical ...   . traMADol (ULTRAM) 50 MG tablet   Oral   Take 50 mg by mouth every 6 (six) hours as needed. pain         . triamterene-hydrochlorothiazide (MAXZIDE-25) 37.5-25 MG per tablet   Oral   Take 1 tablet by mouth every morning.          . vitamin B-12 (CYANOCOBALAMIN) 1000 MCG tablet   Oral   Take 1,000 mcg by mouth daily.         Marland Kitchen zonisamide (ZONEGRAN) 100 MG capsule   Oral   Take 400 mg by mouth at bedtime.            BP 118/80  Pulse 80  Temp(Src) 98.1 F (36.7 C) (Oral)  Resp 16  SpO2 97%  Physical Exam  Nursing note and vitals reviewed. Constitutional: She appears well-developed and well-nourished.  HENT:  Head: Normocephalic and atraumatic.  Right Ear: Tympanic membrane, external ear and ear canal normal.  Left Ear: Tympanic membrane, external ear and ear canal normal.  Nose: Mucosal edema present. No rhinorrhea.  Mouth/Throat: Uvula is midline, oropharynx is clear and moist and mucous  membranes are normal.  Eyes: Conjunctivae are normal. Pupils are equal, round, and reactive to light. Right eye exhibits no discharge. Left eye exhibits no discharge.  No conjunctival pallor. Mild bilateral lateral nystagmus.  Neck: Normal range of motion. Neck supple.  Cardiovascular: Normal rate, regular rhythm and normal heart sounds.   Pulmonary/Chest: Effort normal and breath sounds normal.  Abdominal: Soft. There is no tenderness. There is no rebound and no  guarding.  obese  Lymphadenopathy:    She has no cervical adenopathy.  Neurological: She is alert.  Skin: Skin is warm and dry.  Psychiatric: She has a normal mood and affect.    ED Course  Procedures (including critical care time)  Labs Reviewed  COMPREHENSIVE METABOLIC PANEL - Abnormal; Notable for the following:    Total Bilirubin 0.2 (*)    GFR calc non Af Amer 61 (*)    GFR calc Af Amer 70 (*)    All other components within normal limits  URINALYSIS, ROUTINE W REFLEX MICROSCOPIC - Abnormal; Notable for the following:    Urobilinogen, UA 2.0 (*)    All other components within normal limits  CBC WITH DIFFERENTIAL  LIPASE, BLOOD   No results found.   1. Diarrhea     1:48 PM Patient seen and examined. Work-up initiated. Medications ordered.   Vital signs reviewed and are as follows: Filed Vitals:   07/05/12 1300  BP: 118/80  Pulse: 80  Temp: 98.1 F (36.7 C)  Resp: 16    Date: 07/05/2012  Rate: 57  Rhythm: sinus bradycardia  QRS Axis: normal  Intervals: normal  ST/T Wave abnormalities: normal  Conduction Disutrbances:none  Narrative Interpretation:   Old EKG Reviewed: unchanged   Patient d/w and seen by Dr. Blinda Leatherwood.   Patient informed of results. Exam unchanged during in ED stay. Urged PCP f/u. Patient verbalizes understanding and agrees with plan.   The patient was urged to return to the Emergency Department immediately with worsening of current symptoms, worsening abdominal pain, persistent  vomiting, blood noted in stools, fever, or any other concerns. The patient verbalized understanding.   BP 114/73  Pulse 62  Temp(Src) 98.3 F (36.8 C) (Oral)  Resp 15  SpO2 96%      MDM  Patient with body aches and diarrhea for 3 months. Workup in emergency department is unrevealing. 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 Normal hemoglobin. Metabolic panel and CBC show no abnormalities. No urinary tract infection. Patient appears well. She is tolerating crackers and ginger ale in room. She has primary care physician followup. Do not suspect any emergent conditions given chronic nature of the patient's symptoms. Appropriate return instructions given.       Renne Crigler, PA-C 07/05/12 [phone]

## 2012-07-07 NOTE — ED Provider Notes (Signed)
Medical screening examination/treatment/procedure(s) were conducted as a shared visit with non-physician practitioner(s) and myself.  I personally evaluated the patient during the encounter.  Examined, in no acute distress. Examination was unremarkable. Patient has had problems with chronic diarrhea. He is appropriate for further outpatient workup.  Gilda Crease, MD 07/07/12 1245

## 2012-07-13 ENCOUNTER — Ambulatory Visit (INDEPENDENT_AMBULATORY_CARE_PROVIDER_SITE_OTHER): Payer: Federal, State, Local not specified - Other | Admitting: Psychiatry

## 2012-07-13 DIAGNOSIS — F332 Major depressive disorder, recurrent severe without psychotic features: Secondary | ICD-10-CM

## 2012-07-13 DIAGNOSIS — F333 Major depressive disorder, recurrent, severe with psychotic symptoms: Secondary | ICD-10-CM

## 2012-07-13 MED ORDER — DIAZEPAM 5 MG PO TABS: 5.0000 mg | ORAL_TABLET | Freq: Every day | ORAL | Status: DC | PRN

## 2012-07-13 NOTE — Progress Notes (Signed)
[facility] MD Progress Note  07/13/2012 1:42 PM Gloria Lewis  MRN:  [id number] Subjective: very frustrated Diagnosis:  Axis I: Major Depression, Recurrent severe Today the patient was seen alone. She shares with [place] that she's having difficulty dealing with her chronically mentally ill 56 year old son. Her son has bipolar disorder and schizophrenia. He's not following the rules and Is to her. This is the same is happening to some degree with her 77 year old son. She therefore has 2 boys at home with no man to help her. The patient actually denies persistent daily depression. She feels very depressed and frustrated when she has conflict with them and she often does with the older mentally ill son. In fact recently the past week and she is such conflict and such yelling that she called the police. The patient's son then apologized to her and in fact joint her with a visit to the mental health association this week. Therefore things are going okay and she's not a conflict jacks he feels okay. The patient says she is sleeping well but is not eating well. Patient says she is low energy and can concentrate he continues in school at [employer] in the women's study. The patient denies psychotic symptomatology. Again should be noted that when she tried to take a higher dose of Zoloft above 50 she became overly sedated. The patient generally feels that she is stressed and anxious. I think it's not unreasonable to increase her Valium from 5 mg a day to 5 mg twice a day. ADL's:  Intact  Sleep: Good  Appetite:  Good  Suicidal Ideation:  no Homicidal Ideation:  no AEB (as evidenced by):  Psychiatric Specialty Exam: ROS  There were no vitals taken for this visit.There is no weight on file to calculate BMI.  General Appearance: Casual  Eye Contact::  Good  Speech:  Clear and Coherent  Volume:  Normal  Mood:  Euthymic  Affect:  Congruent  Thought Process:  Coherent  Orientation:  Full (Time, Place, and  Person)  Thought Content:  WDL  Suicidal Thoughts:  No  Homicidal Thoughts:  No  Memory:  nl  Judgement:  Good  Insight:  Good  Psychomotor Activity:  Normal  Concentration:  Good  Recall:  Good  Akathisia:  No  Handed:  Right  AIMS (if indicated):     Assets:  Communication Skills  Sleep:      Current Medications: Current Outpatient Prescriptions  Medication Sig Dispense Refill  . cyclobenzaprine (FLEXERIL) 10 MG tablet Take 1 tablet (10 mg total) by mouth 3 (three) times daily as needed. For muscle spasms  60 tablet  1  . diazepam (VALIUM) 5 MG tablet Take 1 tablet (5 mg total) by mouth daily as needed for anxiety. 1 bid  60 tablet  3  . Multiple Vitamins-Minerals (MULTIVITAMIN WITH MINERALS) tablet Take 1 tablet by mouth daily.        Marland Kitchen omeprazole (PRILOSEC) 40 MG capsule Take 40 mg by mouth 2 (two) times daily.       . pravastatin (PRAVACHOL) 40 MG tablet Take 80 mg by mouth at bedtime.       Marland Kitchen QUEtiapine (SEROQUEL XR) 50 MG TB24 Take 1 tablet (50 mg total) by mouth at bedtime.  30 tablet  5  . sertraline (ZOLOFT) 100 MG tablet Take 1 tablet (100 mg total) by mouth daily.  30 tablet  5  . traMADol (ULTRAM) 50 MG tablet Take 50 mg by mouth every 6 (six)  hours as needed. pain      . triamterene-hydrochlorothiazide (MAXZIDE-25) 37.5-25 MG per tablet Take 1 tablet by mouth every morning.       . vitamin B-12 (CYANOCOBALAMIN) 1000 MCG tablet Take 1,000 mcg by mouth daily.      Marland Kitchen zonisamide (ZONEGRAN) 100 MG capsule Take 400 mg by mouth at bedtime.        No current facility-administered medications for this visit.    Lab Results: No results found for this or any previous visit (from the past 48 hour(s)).  Physical Findings: AIMS:  , ,  ,  ,    CIWA:    COWS:     Treatment Plan Summary: At this time the patient should continue taking Zoloft 50 mg and for the time being will continue on Seroquel 50 mg. Initially we're going to get rid of the Seroquel but I think we'll hold on  for the next month or 2. The patient will however increase her Valium from 5 mg one a day to 5 mg taking 2 a day. The patient will continue in therapy Elita Quick and and will return to see me in 7 weeks.  Plan:  Medical Decision Making Problem Points:  Established problem, stable/improving (1) Data Points:  Review of new medications or change in dosage (2)  I certify that inpatient services furnished can reasonably be expected to improve the patient's condition.   Hershy Flenner IRVING 07/13/2012, 1:43 PM

## 2012-08-04 ENCOUNTER — Ambulatory Visit
Admission: RE | Admit: 2012-08-04 | Discharge: 2012-08-04 | Disposition: A | Discharge status: Home / Self Care | Payer: Medicaid Other | Source: Ambulatory Visit | Attending: Family Medicine | Admitting: Family Medicine

## 2012-08-04 ENCOUNTER — Other Ambulatory Visit: Payer: Self-pay | Admitting: Family Medicine

## 2012-08-04 DIAGNOSIS — R05 Cough: Secondary | ICD-10-CM

## 2012-08-31 ENCOUNTER — Ambulatory Visit (HOSPITAL_COMMUNITY): Payer: Self-pay | Admitting: Psychiatry

## 2012-09-26 ENCOUNTER — Encounter (HOSPITAL_COMMUNITY): Payer: Self-pay | Admitting: Emergency Medicine

## 2012-09-26 ENCOUNTER — Emergency Department (HOSPITAL_COMMUNITY)
Admission: EM | Admit: 2012-09-26 | Discharge: 2012-09-26 | Disposition: A | Discharge status: Home / Self Care | Payer: Medicaid Other | Attending: Emergency Medicine | Admitting: Emergency Medicine

## 2012-09-26 DIAGNOSIS — Z79899 Other long term (current) drug therapy: Secondary | ICD-10-CM | POA: Insufficient documentation

## 2012-09-26 DIAGNOSIS — M5430 Sciatica, unspecified side: Secondary | ICD-10-CM | POA: Insufficient documentation

## 2012-09-26 DIAGNOSIS — IMO0002 Reserved for concepts with insufficient information to code with codable children: Secondary | ICD-10-CM | POA: Insufficient documentation

## 2012-09-26 DIAGNOSIS — I10 Essential (primary) hypertension: Secondary | ICD-10-CM | POA: Insufficient documentation

## 2012-09-26 DIAGNOSIS — M5416 Radiculopathy, lumbar region: Secondary | ICD-10-CM

## 2012-09-26 DIAGNOSIS — Z880 Allergy status to penicillin: Secondary | ICD-10-CM | POA: Insufficient documentation

## 2012-09-26 DIAGNOSIS — F319 Bipolar disorder, unspecified: Secondary | ICD-10-CM | POA: Insufficient documentation

## 2012-09-26 DIAGNOSIS — Z8669 Personal history of other diseases of the nervous system and sense organs: Secondary | ICD-10-CM | POA: Insufficient documentation

## 2012-09-26 DIAGNOSIS — Z860100 Personal history of colon polyps, unspecified: Secondary | ICD-10-CM | POA: Insufficient documentation

## 2012-09-26 DIAGNOSIS — R209 Unspecified disturbances of skin sensation: Secondary | ICD-10-CM | POA: Insufficient documentation

## 2012-09-26 DIAGNOSIS — K219 Gastro-esophageal reflux disease without esophagitis: Secondary | ICD-10-CM | POA: Insufficient documentation

## 2012-09-26 DIAGNOSIS — G43909 Migraine, unspecified, not intractable, without status migrainosus: Secondary | ICD-10-CM | POA: Insufficient documentation

## 2012-09-26 DIAGNOSIS — Z981 Arthrodesis status: Secondary | ICD-10-CM | POA: Insufficient documentation

## 2012-09-26 DIAGNOSIS — F411 Generalized anxiety disorder: Secondary | ICD-10-CM | POA: Insufficient documentation

## 2012-09-26 DIAGNOSIS — R112 Nausea with vomiting, unspecified: Secondary | ICD-10-CM | POA: Insufficient documentation

## 2012-09-26 DIAGNOSIS — F17200 Nicotine dependence, unspecified, uncomplicated: Secondary | ICD-10-CM | POA: Insufficient documentation

## 2012-09-26 DIAGNOSIS — M1990 Unspecified osteoarthritis, unspecified site: Secondary | ICD-10-CM | POA: Insufficient documentation

## 2012-09-26 DIAGNOSIS — R51 Headache: Secondary | ICD-10-CM | POA: Insufficient documentation

## 2012-09-26 DIAGNOSIS — E7800 Pure hypercholesterolemia, unspecified: Secondary | ICD-10-CM | POA: Insufficient documentation

## 2012-09-26 LAB — COMPREHENSIVE METABOLIC PANEL
ALT: 9 U/L (ref 0–35)
AST: 16 U/L (ref 0–37)
Alkaline Phosphatase: 114 U/L (ref 39–117)
CO2: 27 mEq/L (ref 19–32)
GFR calc Af Amer: >90 mL/min (ref >90)
Glucose, Bld: 103 mg/dL — ABNORMAL HIGH (ref 70–99)
Potassium: 3.4 mEq/L — ABNORMAL LOW (ref 3.5–5.1)
Sodium: 139 mEq/L (ref 135–145)
Total Protein: 7.7 g/dL (ref 6.0–8.3)

## 2012-09-26 LAB — CBC WITH DIFFERENTIAL/PLATELET
Basophils Absolute: 0 10*3/uL (ref 0.0–0.1)
Eosinophils Absolute: 0.2 10*3/uL (ref 0.0–0.7)
Lymphocytes Relative: 46 % (ref 12–46)
Lymphs Abs: 3.3 10*3/uL (ref 0.7–4.0)
Neutrophils Relative %: 45 % (ref 43–77)
Platelets: 358 10*3/uL (ref 150–400)
RBC: 4.42 MIL/uL (ref 3.87–5.11)
WBC: 7.3 10*3/uL (ref 4.0–10.5)

## 2012-09-26 MED ORDER — ONDANSETRON HCL 4 MG/2ML IJ SOLN: 4.0000 mg | Freq: Once | INTRAMUSCULAR | Status: DC

## 2012-09-26 MED ORDER — PREDNISONE 20 MG PO TABS: ORAL_TABLET | ORAL | Status: DC

## 2012-09-26 MED ORDER — PREDNISONE 20 MG PO TABS
60.0000 mg | ORAL_TABLET | Freq: Once | ORAL | Status: AC
  Administered 2012-09-26: 60 mg via ORAL
  Filled 2012-09-26: qty 3

## 2012-09-26 MED ORDER — ONDANSETRON 4 MG PO TBDP
4.0000 mg | ORAL_TABLET | Freq: Once | ORAL | Status: AC
  Administered 2012-09-26: 4 mg via ORAL
  Filled 2012-09-26: qty 1

## 2012-09-26 NOTE — ED Notes (Signed)
Pt presenting to ed with c/o headache pain, back pain, buttock pain, bilateral leg pain and nausea and vomiting. Pt states she received injection on Friday for her back and has had nausea and vomiting since.

## 2012-09-26 NOTE — ED Provider Notes (Signed)
History    CSN: [id number] Arrival date & time 09/26/12  1518  First MD Initiated Contact with Patient 09/26/12 1546     Chief Complaint  Patient presents with  . Headache  . Back Pain   (Consider location/radiation/quality/duration/timing/severity/associated sxs/prior Treatment) HPI Comments: 57 y/o female with a past medical history of hypertension, osteoarthritis, migraines, anxiety, depression, bipolar and schizoaffective psychosis presents the emergency department complaining of low back pain radiating down both of her legs beginning on Friday after receiving an epidural injection from the pain clinic. Describes the pain as throbbing, rated 8/10. Nothing in specific makes the pain worse or better. She was advised to take her suboxone for her pain without relief. Is to associated numbness and tingling at random down both of her legs. Denies difficulty walking. No loss of control of bowels or bladder or saddle anesthesia. Denies fever or chills. Saturday she developed nausea and vomiting, last episode yesterday. She has been careful what she eats to avoid vomiting. Denies abdominal or pelvic pain. Unhappy with her pain clinic and would like to find a new one.  Patient is a 57 y.o. female presenting with headaches and back pain. The history is provided by the patient.  Headache Associated symptoms: back pain, nausea, numbness and vomiting   Associated symptoms: no abdominal pain, no fever, no neck pain and no neck stiffness   Back Pain Associated symptoms: headaches and numbness   Associated symptoms: no abdominal pain, no chest pain, no fever, no pelvic pain and no weakness    Past Medical History  Diagnosis Date  . HTN (hypertension)   . Bipolar 1 disorder   . OA (osteoarthritis)   . GERD (gastroesophageal reflux disease)   . Hypercholesterolemia   . Fever blister   . HA (headache)   . Colon polyp   . CTS (carpal tunnel syndrome)   . Schizo-affective psychosis   . Anxiety     . Depression   . Migraines    Past Surgical History  Procedure Laterality Date  . Neck surgery  2009  . Carpal tunnel release  20110 rt/lt  . Polp removed  2011  . Back injection    . 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 Anterior cervical decomp/discectomy fusion  08/27/2011    Procedure: ANTERIOR CERVICAL DECOMPRESSION/DISCECTOMY FUSION 1 LEVEL/HARDWARE REMOVAL;  Surgeon: Tia Alert, MD;  Location: [facility];  Service: Neurosurgery;  Laterality: Bilateral;  Cervical four-five Anterior cervical decompression/diskectomy, fusion, Plate, Removal of Cervical five-seven Plate   Family History  Problem Relation Age of Onset  . Coronary artery disease Father   . Hypertension Mother   . Diabetes Mother   . Schizophrenia Mother   . Depression Brother   . Prostate cancer Brother   . Anesthesia problems Neg Hx   . Hypotension Neg Hx   . Malignant hyperthermia Neg Hx   . Pseudochol deficiency Neg Hx    History  Substance Use Topics  . Smoking status: Current Every Day Smoker -- 0.25 packs/day for 30 years    Types: Cigarettes  . Smokeless tobacco: Not on file  . Alcohol Use: No   OB History   Grav Para Term Preterm Abortions TAB SAB Ect Mult Living                 Review of Systems  Constitutional: Negative for fever, chills and activity change.  HENT: Negative for neck pain and neck  stiffness.   Respiratory: Negative for shortness of breath.   Cardiovascular: Negative for chest pain.  Gastrointestinal: Positive for nausea and vomiting. Negative for abdominal pain.  Genitourinary: Negative for pelvic pain.  Musculoskeletal: Positive for back pain.  Skin: Negative for color change and rash.  Neurological: Positive for numbness and headaches. Negative for weakness.  All other systems reviewed and are negative.    Allergies  Amoxicillin; Chantix; Effexor; Paroxetine hcl; Penicillins; Zithromax; and Hydrocodone  Home Medications    Current Outpatient Rx  Name  Route  Sig  Dispense  Refill  . chlorzoxazone (PARAFON) 500 MG tablet   Oral   Take 500 mg by mouth 4 (four) times daily as needed for muscle spasms.         . cyclobenzaprine (FLEXERIL) 10 MG tablet   Oral   Take 1 tablet (10 mg total) by mouth 3 (three) times daily as needed. For muscle spasms   60 tablet   1   . diazepam (VALIUM) 5 MG tablet   Oral   Take 5 mg by mouth every 12 (twelve) hours as needed for anxiety.         . Multiple Vitamins-Minerals (MULTIVITAMIN WITH MINERALS) tablet   Oral   Take 1 tablet by mouth daily.           Marland Kitchen omeprazole (PRILOSEC) 40 MG capsule   Oral   Take 40 mg by mouth 2 (two) times daily.          . pravastatin (PRAVACHOL) 40 MG tablet   Oral   Take 80 mg by mouth at bedtime.          . pregabalin (LYRICA) 25 MG capsule   Oral   Take 25 mg by mouth 3 (three) times daily as needed (for fibromyalgia).         . promethazine (PHENERGAN) 25 MG tablet   Oral   Take 25 mg by mouth every 6 (six) hours as needed for nausea.         Marland Kitchen QUEtiapine (SEROQUEL XR) 50 MG TB24   Oral   Take 1 tablet (50 mg total) by mouth at bedtime.   30 tablet   5     Please note dosage and type changed from previous  ...   . sertraline (ZOLOFT) 100 MG tablet   Oral   Take 1 tablet (100 mg total) by mouth daily.   30 tablet   5     Please send all future refill request electronical ...   . traMADol (ULTRAM) 50 MG tablet   Oral   Take 50 mg by mouth every 6 (six) hours as needed for pain.          Marland Kitchen triamterene-hydrochlorothiazide (MAXZIDE-25) 37.5-25 MG per tablet   Oral   Take 1 tablet by mouth every morning.          . vitamin B-12 (CYANOCOBALAMIN) 1000 MCG tablet   Oral   Take 1,000 mcg by mouth daily.         Marland Kitchen zonisamide (ZONEGRAN) 100 MG capsule   Oral   Take 400 mg by mouth at bedtime.          . predniSONE (DELTASONE) 20 MG tablet      2 tabs po daily x 4 days   8 tablet   0     BP 129/88  Pulse 76  Temp(Src) 98.7 F (37.1 C) (Oral)  Resp 16  SpO2 98% Physical Exam  Nursing  note and vitals reviewed. Constitutional: She is oriented to person, place, and time. She appears well-developed. No distress.  Obese.  HENT:  Head: Normocephalic and atraumatic.  Mouth/Throat: Oropharynx is clear and moist.  Eyes: Conjunctivae are normal.  Neck: Normal range of motion. Neck supple.  Cardiovascular: Normal rate, regular rhythm, normal heart sounds and intact distal pulses.   No extremity edema.  Pulmonary/Chest: Effort normal and breath sounds normal. No respiratory distress. She has no wheezes.  Abdominal: Soft. Bowel sounds are normal. She exhibits no distension. There is no tenderness.  Musculoskeletal: Normal range of motion. She exhibits no edema.       Back:  Neurological: She is alert and oriented to person, place, and time. She has normal strength. No sensory deficit. Gait normal.  Skin: Skin is warm and dry. No rash noted. She is not diaphoretic. No erythema.  Injection site in center of lumbar spine without erythema, edema, discharge. Area < 1 mm. Non-tender.  Psychiatric: She has a normal mood and affect. Her behavior is normal.    ED Course  Procedures (including critical care time) Labs Reviewed  CBC WITH DIFFERENTIAL  COMPREHENSIVE METABOLIC PANEL   No results found. 1. Sciatica, unspecified laterality   2. Lumbar radiculopathy     MDM  Lumbar radiculopathy, sciatica. Number flags concerning patient's back pain. She is ambulating without difficulty, no focal neurologic deficits, no signs of cauda equina. Afebrile and in no apparent distress. Currently under pain management and cannot receive narcotic medication. We'll give prednisone, first dose given in the emergency department. She will followup with her primary care and pain clinic. Resource guide given. Return precautions discussed. Patient states understanding of plan and is  agreeable.  Trevor Mace, PA-C 09/26/12 1644

## 2012-09-27 NOTE — ED Provider Notes (Signed)
Medical screening examination/treatment/procedure(s) were performed by non-physician practitioner and as supervising physician I was immediately available for consultation/collaboration.  Derwood Kaplan, MD 09/27/12 0111

## 2012-10-14 ENCOUNTER — Ambulatory Visit (INDEPENDENT_AMBULATORY_CARE_PROVIDER_SITE_OTHER): Payer: Federal, State, Local not specified - Other | Admitting: Psychiatry

## 2012-10-14 ENCOUNTER — Other Ambulatory Visit (HOSPITAL_COMMUNITY): Payer: Self-pay | Admitting: Family Medicine

## 2012-10-14 DIAGNOSIS — Z1231 Encounter for screening mammogram for malignant neoplasm of breast: Secondary | ICD-10-CM

## 2012-10-14 DIAGNOSIS — F331 Major depressive disorder, recurrent, moderate: Secondary | ICD-10-CM

## 2012-10-14 DIAGNOSIS — F332 Major depressive disorder, recurrent severe without psychotic features: Secondary | ICD-10-CM | POA: Insufficient documentation

## 2012-10-14 MED ORDER — DIAZEPAM 5 MG PO TABS: 5.0000 mg | ORAL_TABLET | Freq: Two times a day (BID) | ORAL | Status: DC

## 2012-10-14 MED ORDER — SERTRALINE HCL 100 MG PO TABS: 100.0000 mg | ORAL_TABLET | Freq: Two times a day (BID) | ORAL | Status: DC

## 2012-10-14 NOTE — Progress Notes (Signed)
[facility] MD Progress Note  10/14/2012 11:49 AM Gloria Lewis  MRN:  [id number] Subjective:  frustrated Diagnosis:  Axis I: Major Depression, single episode The patient is seen one time and alone. The patient clearly has been distressed over the last 2 months and she's had 2E date her older son. Her son is mentally ill and apparently is acting out. She still has a young son is doing well. 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 Unfortunately her grades have fallen she is now C. Student. Patient admits to daily persistent depression for the last 2 months. Her sleep is currently impacted although she has no daytime dysfunction. Her appetite has not changed. Her concentration is significantly reduced as is her energy. The patient is still able to enjoy things in life including reading the computer and TV. He denies being worthless. She denies being suicidal. Patient denies the use of alcohol. She denies symptoms of psychosis. She is minimal anxiety. Increase Valium to 10 mg a day has been helpful. Noted is the patient continues take a low dose of Seroquel 50 mg which will leave as is. The patient denies any shortness of breath or chest pain she has no physical complaints. She denies symptoms of a neurological process. Today reviewed the pros and cons of her medications and she agreed to take them as prescribed. ADL's:  Intact  Sleep: Good  Appetite:  Good  Suicidal Ideation:  no Homicidal Ideation:  no AEB (as evidenced by):  Psychiatric Specialty Exam: ROS  There were no vitals taken for this visit.There is no weight on file to calculate BMI.  General Appearance: Casual  Eye Contact::  Good  Speech:  Clear and Coherent  Volume:  Normal  Mood:  Depressed  Affect:  Congruent  Thought Process:  Goal Directed  Orientation:  Full (Time, Place, and Person)  Thought Content:  WDL  Suicidal Thoughts:  No  Homicidal Thoughts:  No  Memory:  nl  Judgement:   Good  Insight:  Good  Psychomotor Activity:  Decreased  Concentration:  Fair  Recall:  Good  Akathisia:  No  Handed:  Right  AIMS (if indicated):     Assets:  Desire for Improvement  Sleep:      Current Medications: Current Outpatient Prescriptions  Medication Sig Dispense Refill  . chlorzoxazone (PARAFON) 500 MG tablet Take 500 mg by mouth 4 (four) times daily as needed for muscle spasms.      . cyclobenzaprine (FLEXERIL) 10 MG tablet Take 1 tablet (10 mg total) by mouth 3 (three) times daily as needed. For muscle spasms  60 tablet  1  . diazepam (VALIUM) 5 MG tablet Take 1 tablet (5 mg total) by mouth 2 (two) times daily.  60 tablet  3  . Multiple Vitamins-Minerals (MULTIVITAMIN WITH MINERALS) tablet Take 1 tablet by mouth daily.        Marland Kitchen omeprazole (PRILOSEC) 40 MG capsule Take 40 mg by mouth 2 (two) times daily.       . pravastatin (PRAVACHOL) 40 MG tablet Take 80 mg by mouth at bedtime.       . predniSONE (DELTASONE) 20 MG tablet 2 tabs po daily x 4 days  8 tablet  0  . pregabalin (LYRICA) 25 MG capsule Take 25 mg by mouth 3 (three) times daily as needed (for fibromyalgia).      . promethazine (PHENERGAN) 25 MG tablet Take 25 mg by mouth every 6 (  six) hours as needed for nausea.      Marland Kitchen QUEtiapine (SEROQUEL XR) 50 MG TB24 Take 1 tablet (50 mg total) by mouth at bedtime.  30 tablet  5  . sertraline (ZOLOFT) 100 MG tablet Take 1 tablet (100 mg total) by mouth 2 (two) times daily.  60 tablet  5  . traMADol (ULTRAM) 50 MG tablet Take 50 mg by mouth every 6 (six) hours as needed for pain.       Marland Kitchen triamterene-hydrochlorothiazide (MAXZIDE-25) 37.5-25 MG per tablet Take 1 tablet by mouth every morning.       . vitamin B-12 (CYANOCOBALAMIN) 1000 MCG tablet Take 1,000 mcg by mouth daily.      Marland Kitchen zonisamide (ZONEGRAN) 100 MG capsule Take 400 mg by mouth at bedtime.        No current facility-administered medications for this visit.    Lab Results: No results found for this or any previous  visit (from the past 48 hour(s)).  Physical Findings: AIMS:  , ,  ,  ,    CIWA:    COWS:     Treatment Plan Summary:  At this time will go ahead and increase her Zoloft from 100 mg2 dose of 200 mg each morning. The patient date he remembers feeling overly sedated with higher doses all of which is not certain. She was on other medications and much was gone when at that time. Blisters to continue her Valium as is but she'll take both of them at night which hopefully will help her sleep a bit. The patient continues in talking therapy with a counselor and the community. They reviewed the pros and cons of her medications and she agreed to take them as prescribed. This patient return to see me in 2 months Plan:  Medical Decision Making Problem Points:  Established problem, worsening (2) Data Points:  Review of new medications or change in dosage (2)  I certify that inpatient services furnished can reasonably be expected to improve the patient's condition.   Gloria Lewis 10/14/2012, 11:49 AM

## 2012-10-18 ENCOUNTER — Ambulatory Visit (HOSPITAL_COMMUNITY)
Admission: RE | Admit: 2012-10-18 | Discharge: 2012-10-18 | Disposition: A | Discharge status: Home / Self Care | Payer: Medicaid Other | Source: Ambulatory Visit | Attending: Family Medicine | Admitting: Family Medicine

## 2012-10-18 DIAGNOSIS — Z1231 Encounter for screening mammogram for malignant neoplasm of breast: Secondary | ICD-10-CM | POA: Insufficient documentation

## 2012-11-29 ENCOUNTER — Emergency Department (HOSPITAL_COMMUNITY): Payer: Medicaid Other

## 2012-11-29 ENCOUNTER — Emergency Department (HOSPITAL_COMMUNITY)
Admission: EM | Admit: 2012-11-29 | Discharge: 2012-11-29 | Disposition: A | Discharge status: Home / Self Care | Payer: Medicaid Other | Attending: Emergency Medicine | Admitting: Emergency Medicine

## 2012-11-29 ENCOUNTER — Encounter (HOSPITAL_COMMUNITY): Payer: Self-pay

## 2012-11-29 DIAGNOSIS — Z8739 Personal history of other diseases of the musculoskeletal system and connective tissue: Secondary | ICD-10-CM | POA: Insufficient documentation

## 2012-11-29 DIAGNOSIS — S79919A Unspecified injury of unspecified hip, initial encounter: Secondary | ICD-10-CM | POA: Insufficient documentation

## 2012-11-29 DIAGNOSIS — Z860100 Personal history of colon polyps, unspecified: Secondary | ICD-10-CM | POA: Insufficient documentation

## 2012-11-29 DIAGNOSIS — Z880 Allergy status to penicillin: Secondary | ICD-10-CM | POA: Insufficient documentation

## 2012-11-29 DIAGNOSIS — G43909 Migraine, unspecified, not intractable, without status migrainosus: Secondary | ICD-10-CM | POA: Insufficient documentation

## 2012-11-29 DIAGNOSIS — F259 Schizoaffective disorder, unspecified: Secondary | ICD-10-CM | POA: Insufficient documentation

## 2012-11-29 DIAGNOSIS — Y9389 Activity, other specified: Secondary | ICD-10-CM | POA: Insufficient documentation

## 2012-11-29 DIAGNOSIS — F411 Generalized anxiety disorder: Secondary | ICD-10-CM | POA: Insufficient documentation

## 2012-11-29 DIAGNOSIS — W11XXXA Fall on and from ladder, initial encounter: Secondary | ICD-10-CM | POA: Insufficient documentation

## 2012-11-29 DIAGNOSIS — F319 Bipolar disorder, unspecified: Secondary | ICD-10-CM | POA: Insufficient documentation

## 2012-11-29 DIAGNOSIS — K219 Gastro-esophageal reflux disease without esophagitis: Secondary | ICD-10-CM | POA: Insufficient documentation

## 2012-11-29 DIAGNOSIS — Z79899 Other long term (current) drug therapy: Secondary | ICD-10-CM | POA: Insufficient documentation

## 2012-11-29 DIAGNOSIS — M542 Cervicalgia: Secondary | ICD-10-CM

## 2012-11-29 DIAGNOSIS — E7800 Pure hypercholesterolemia, unspecified: Secondary | ICD-10-CM | POA: Insufficient documentation

## 2012-11-29 DIAGNOSIS — I10 Essential (primary) hypertension: Secondary | ICD-10-CM | POA: Insufficient documentation

## 2012-11-29 DIAGNOSIS — IMO0002 Reserved for concepts with insufficient information to code with codable children: Secondary | ICD-10-CM | POA: Insufficient documentation

## 2012-11-29 DIAGNOSIS — Z8619 Personal history of other infectious and parasitic diseases: Secondary | ICD-10-CM | POA: Insufficient documentation

## 2012-11-29 DIAGNOSIS — F17200 Nicotine dependence, unspecified, uncomplicated: Secondary | ICD-10-CM | POA: Insufficient documentation

## 2012-11-29 DIAGNOSIS — M25551 Pain in right hip: Secondary | ICD-10-CM

## 2012-11-29 DIAGNOSIS — Z8669 Personal history of other diseases of the nervous system and sense organs: Secondary | ICD-10-CM | POA: Insufficient documentation

## 2012-11-29 DIAGNOSIS — S0990XA Unspecified injury of head, initial encounter: Secondary | ICD-10-CM | POA: Insufficient documentation

## 2012-11-29 DIAGNOSIS — S0993XA Unspecified injury of face, initial encounter: Secondary | ICD-10-CM | POA: Insufficient documentation

## 2012-11-29 DIAGNOSIS — Y92009 Unspecified place in unspecified non-institutional (private) residence as the place of occurrence of the external cause: Secondary | ICD-10-CM | POA: Insufficient documentation

## 2012-11-29 MED ORDER — HYDROCODONE-ACETAMINOPHEN 5-325 MG PO TABS
2.0000 | ORAL_TABLET | Freq: Once | ORAL | Status: AC
  Administered 2012-11-29: 2 via ORAL
  Filled 2012-11-29: qty 2

## 2012-11-29 MED ORDER — HYDROCODONE-ACETAMINOPHEN 5-325 MG PO TABS: ORAL_TABLET | ORAL | Status: DC

## 2012-11-29 NOTE — ED Provider Notes (Signed)
CSN: [id number]     Arrival date & time 11/29/12  1232 History   First MD Initiated Contact with Patient 11/29/12 1241     Chief Complaint  Patient presents with  . Fall  . Neck Pain  . Back Pain   (Consider location/radiation/quality/duration/timing/severity/associated sxs/prior Treatment) HPI Pt is a 57yo female c/o neck pain, and right sided pain after falling from an 61ft ladder at home while trimming bushes PTA.  Pt reports previous surgery to her neck several years ago.  C/o constant aching pain, 7/10, worse in right hip and buttock.  Pain with ambulation and head movement.  Denies hitting head or LOC.  Denies headache, change in vision, nausea or vomiting.  Did take valium PTA to calm her nerves.  States it did help calm her down but did not help with pain.     Past Medical History  Diagnosis Date  . HTN (hypertension)   . Bipolar 1 disorder   . OA (osteoarthritis)   . GERD (gastroesophageal reflux disease)   . Hypercholesterolemia   . Fever blister   . HA (headache)   . Colon polyp   . CTS (carpal tunnel syndrome)   . Schizo-affective psychosis   . Anxiety   . Depression   . Migraines    Past Surgical History  Procedure Laterality Date  . Neck surgery  2009  . Carpal tunnel release  20110 rt/lt  . Polp removed  2011  . Back injection    . 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 Anterior cervical decomp/discectomy fusion  08/27/2011    Procedure: ANTERIOR CERVICAL DECOMPRESSION/DISCECTOMY FUSION 1 LEVEL/HARDWARE REMOVAL;  Surgeon: Tia Alert, MD;  Location: [facility];  Service: Neurosurgery;  Laterality: Bilateral;  Cervical four-five Anterior cervical decompression/diskectomy, fusion, Plate, Removal of Cervical five-seven Plate   Family History  Problem Relation Age of Onset  . Coronary artery disease Father   . Hypertension Mother   . Diabetes Mother   . Schizophrenia Mother   . Depression Brother   . Prostate  cancer Brother   . Anesthesia problems Neg Hx   . Hypotension Neg Hx   . Malignant hyperthermia Neg Hx   . Pseudochol deficiency Neg Hx    History  Substance Use Topics  . Smoking status: Current Every Day Smoker -- 0.25 packs/day for 30 years    Types: Cigarettes  . Smokeless tobacco: Not on file  . Alcohol Use: No   OB History   Grav Para Term Preterm Abortions TAB SAB Ect Mult Living                 Review of Systems  Gastrointestinal: Negative for nausea and vomiting.  Musculoskeletal: Positive for myalgias, back pain and arthralgias. Negative for joint swelling.  Skin: Negative for wound.  Neurological: Positive for headaches. Negative for dizziness, weakness, light-headedness and numbness.  All other systems reviewed and are negative.    Allergies  Amoxicillin; Chantix; Effexor; Paroxetine hcl; Penicillins; Zithromax; and Hydrocodone  Home Medications   Current Outpatient Rx  Name  Route  Sig  Dispense  Refill  . diazepam (VALIUM) 5 MG tablet   Oral   Take 1 tablet (5 mg total) by mouth 2 (two) times daily.   60 tablet   3   . pravastatin (PRAVACHOL) 40 MG tablet   Oral   Take 40 mg by mouth at bedtime.          Marland Kitchen  QUEtiapine (SEROQUEL XR) 50 MG TB24   Oral   Take 1 tablet (50 mg total) by mouth at bedtime.   30 tablet   5     Please note dosage and type changed from previous  ...   . sertraline (ZOLOFT) 100 MG tablet   Oral   Take 150 mg by mouth daily.         . vitamin B-12 (CYANOCOBALAMIN) 1000 MCG tablet   Oral   Take 1,000 mcg by mouth daily.         Marland Kitchen zonisamide (ZONEGRAN) 100 MG capsule   Oral   Take 400 mg by mouth at bedtime.          . chlorzoxazone (PARAFON) 500 MG tablet   Oral   Take 500 mg by mouth 4 (four) times daily as needed for muscle spasms.         Marland Kitchen HYDROcodone-acetaminophen (NORCO/VICODIN) 5-325 MG per tablet      Take 1-2 pills every 4-6 hours as needed for pain.   10 tablet   0   . Multiple  Vitamins-Minerals (MULTIVITAMIN WITH MINERALS) tablet   Oral   Take 1 tablet by mouth daily.           Marland Kitchen omeprazole (PRILOSEC) 40 MG capsule   Oral   Take 40 mg by mouth 2 (two) times daily.           BP 129/72  Pulse 66  Temp(Src) 98.4 F (36.9 C) (Oral)  Resp 20  SpO2 98% Physical Exam  Nursing note and vitals reviewed. Constitutional: She is oriented to person, place, and time. She appears well-developed and well-nourished. No distress.  HENT:  Head: Normocephalic and atraumatic.  Eyes: Conjunctivae are normal. No scleral icterus.  Neck: Normal range of motion. Neck supple.    Tenderness at base of occipital bone and along paraspinal muscles.  No step offs or crepitus.    Cardiovascular: Normal rate, regular rhythm and normal heart sounds.   Pulmonary/Chest: Effort normal and breath sounds normal. No respiratory distress. She has no wheezes. She has no rales. She exhibits no tenderness.  Abdominal: Soft. Bowel sounds are normal. She exhibits no distension and no mass. There is no tenderness. There is no rebound and no guarding.  Musculoskeletal: Normal range of motion. She exhibits tenderness.       Cervical back: She exhibits tenderness.       Back:  Neurological: She is alert and oriented to person, place, and time. She has normal strength. No cranial nerve deficit or sensory deficit. Coordination and gait normal. GCS eye subscore is 4. GCS verbal subscore is 5. GCS motor subscore is 6.  CN II-XII in tact, no focal deficit, nl finger to nose coordination. Sensation grossly in tact, 5/5 strength in all major muscle groups. Neg romberg and nl gait.    Skin: Skin is warm and dry. She is not diaphoretic.    ED Course  Procedures (including critical care time) Labs Review Labs Reviewed - No data to display Imaging Review Dg Cervical Spine Complete  11/29/2012   *RADIOLOGY REPORT*  Clinical Data: Fall off ladder, neck pain  CERVICAL SPINE - COMPLETE 4+ VIEW  Comparison:  MRI cervical spine dated 05/09/2011  Findings: Cervical spine is visualized to C6-7 on the lateral view.  Mild straightening of the cervical spine.  C4-5 ACDF.  Bony fusion at C5-7 with removal of prior anterior cervical hardware.  No evidence of fracture or dislocation.  Vertebral body  heights are maintained.  The dens appears intact.  Lateral masses of C1 are symmetric.  Mild narrowing of the C4-5 neural foramina bilaterally.  Surgical clips overlying the lower neck.  Visualized lung apices are clear.  IMPRESSION: No fracture or dislocation is seen.  C4-5 ACDF with prior bony fusion at C5-7.  Mild narrowing of the bilateral neural foramina at C4-5.   Original Report Authenticated By: Charline Bills, M.D.   Dg Hip Complete Right  11/29/2012   *RADIOLOGY REPORT*  Clinical Data: Fall, neck pain, back pain, right hip pain  RIGHT HIP - COMPLETE 2+ VIEW  Comparison: None.  Findings: Three views of the right hip submitted.  No acute fracture or subluxation.  Bilateral hip joints are symmetrical in appearance.  No radiopaque foreign body.  IMPRESSION: No acute fracture or subluxation.   Original Report Authenticated By: Natasha Mead, M.D.    MDM   1. Fall from ladder, initial encounter   2. Neck pain   3. Right hip pain    Pain mostly in neck, right upper trapezius and right hip.  Will get plain films and tx with norco as pt has already taken valium PTA.   Pt states she is feeling a little better after sitting in ER.  Plain films unremarkable.  Rx: norco, pt has valium at home.  F/u with Dr. Cliffton Asters, PCP as needed for continued pain. Return precautions provided. Verbalized understanding and agreement with tx plan.     Junius Finner, PA-C 11/29/12 1411

## 2012-11-29 NOTE — Discharge Instructions (Signed)
Arthralgia  Arthralgia is joint pain. A joint is a place where two bones meet. Joint pain can happen for many reasons. The joint can be bruised, stiff, infected, or weak from aging. Pain usually goes away after resting and taking medicine for soreness.   HOME CARE  · Rest the joint as told by your doctor.  · Keep the sore joint raised (elevated) for the first 24 hours.  · Put ice on the joint area.  · Put ice in a plastic bag.  · Place a towel between your skin and the bag.  · Leave the ice on for 15-20 minutes, 3-4 times a day.  · Wear your splint, casting, elastic bandage, or sling as told by your doctor.  · Only take medicine as told by your doctor. Do not take aspirin.  · Use crutches as told by your doctor. Do not put weight on the joint until told to by your doctor.  GET HELP RIGHT AWAY IF:   · You have bruising, puffiness (swelling), or more pain.  · Your fingers or toes turn blue or start to lose feeling (numb).  · Your medicine does not lessen the pain.  · Your pain becomes severe.  · You have a temperature by mouth above 102° F (38.9° C), not controlled by medicine.  · You cannot move or use the joint.  MAKE SURE YOU:   · Understand these instructions.  · Will watch your condition.  · Will get help right away if you are not doing well or get worse.  Document Released: 03/11/2009 Document Revised: 06/15/2011 Document Reviewed: 03/11/2009  [employer]® Patient Information ©2014 [employer].

## 2012-11-29 NOTE — ED Notes (Signed)
MD at bedside.  Fall of ladder approx 8 feet, No LOC hip pain rt side bil lateral neck pain increased with movement- ago- took valium and bath before arrival

## 2012-11-29 NOTE — ED Provider Notes (Signed)
Medical screening examination/treatment/procedure(s) were performed by non-physician practitioner and as supervising physician I was immediately available for consultation/collaboration.  Donnetta Hutching, MD 11/29/12 1537

## 2012-11-29 NOTE — ED Notes (Signed)
Patient transported to X-ray 

## 2012-11-29 NOTE — ED Notes (Signed)
Pt still not in room from xray

## 2012-11-29 NOTE — ED Notes (Signed)
Family at bedside. 

## 2012-11-29 NOTE — ED Notes (Signed)
Pt states fell off a ladder, landing on rt side, c/o pain to neck, back and whole rt side, pt ambulatory, denies LOC, no distress noted

## 2012-12-15 ENCOUNTER — Ambulatory Visit (HOSPITAL_COMMUNITY): Payer: Self-pay | Admitting: Psychiatry

## 2012-12-23 ENCOUNTER — Ambulatory Visit (INDEPENDENT_AMBULATORY_CARE_PROVIDER_SITE_OTHER): Payer: Federal, State, Local not specified - Other | Admitting: Psychiatry

## 2012-12-23 VITALS — BP 102/78 | HR 88 | Resp 18 | Ht 66.61 in | Wt 207.4 lb

## 2012-12-23 DIAGNOSIS — F331 Major depressive disorder, recurrent, moderate: Secondary | ICD-10-CM

## 2012-12-23 DIAGNOSIS — F332 Major depressive disorder, recurrent severe without psychotic features: Secondary | ICD-10-CM

## 2012-12-23 MED ORDER — DIAZEPAM 5 MG PO TABS: 5.0000 mg | ORAL_TABLET | Freq: Two times a day (BID) | ORAL | Status: DC

## 2012-12-23 NOTE — Progress Notes (Signed)
[facility] MD Progress Note  12/23/2012 11:24 AM Gloria Lewis  MRN:  [id number] Subjective:  Is better Today the patient says that she doesn't feel depressed persistently and she was on her last visit. The good news is her son age 56 now has his own housing. The problem is is that he has a chronic mental illness and at this time has no providers. This is a set up as the patient often comes and tries to rescue him and usually this causes her decline. Her other son is doing well. Since the increase of Zoloft from 100-200 mg the patient says her mood is improved and her energy level is a bit better. The patient denies anhedonia. The patient also notes that she has lost weight yet she says she is eating better. The patient has chronic problems with her sleep but it is not all that dramatic. Unfortunately since her last visit the patient also has fallen and injured her back. She is going to have a back procedure done in the next month which apparently sounds like more than just an injection. She refuses to have surgery. Noted is the patient is in psychotherapy with Elita Quick, a therapist in the community. Unfortunately the patient did so poorly at school that they were going to have her wait a year or 2 get back. She was able to talk to them and tell them about circumstances that led to her problems. They therefore allowed her to return in 6 months. The patient is to go to college the [employer]. This patient does not drink or use any drugs. The patient is not suicidal. We reviewed the use of Seroquel and this patient. At first she implied that it helped her sleep but in retrospect it really does not. She claimed it was for her anxiety and in reality she is taking Valium 5 mg twice a day for her anxiety. This patient has never been psychotic. At this time is simply is no clear indication for taking Seroquel and my wishes to discontinue it. She'll stay on all her other medications for now and return to see me  in just 7-8 weeks Diagnosis:   DSM5: Schizophrenia Disorders:   Obsessive-Compulsive Disorders:   Trauma-Stressor Disorders:   Substance/Addictive Disorders:   Depressive Disorders:  Major Depressive Disorder - Moderate (296.22)  Axis I: Major Depression, Recurrent severe  ADL's:  Intact  Sleep: Good  Appetite:  Good  Suicidal Ideation:  no Homicidal Ideation:  no AEB (as evidenced by):  Psychiatric Specialty Exam: ROS  Blood pressure 102/78, pulse 88, resp. rate 18, height 5' 6.61" (1.692 m), weight 207 lb 6.4 oz (94.076 kg).Body mass index is 32.86 kg/(m^2).  General Appearance: Casual  Eye Contact::  Good  Speech:  Clear and Coherent  Volume:  Normal  Mood:  NA  Affect:  Appropriate  Thought Process:  Coherent  Orientation:  Full (Time, Place, and Person)  Thought Content:  WDL  Suicidal Thoughts:  No  Homicidal Thoughts:  No  Memory:  NA  Judgement:  Good  Insight:  Good  Psychomotor Activity:  Normal  Concentration:  Good  Recall:  Good  Akathisia:  No  Handed:  Right  AIMS (if indicated):     Assets:  Desire for Improvement  Sleep:      Current Medications: Current Outpatient Prescriptions  Medication Sig Dispense Refill  . chlorzoxazone (PARAFON) 500 MG tablet Take 500 mg by mouth 4 (four) times daily as needed for muscle spasms.      Marland Kitchen  diazepam (VALIUM) 5 MG tablet Take 1 tablet (5 mg total) by mouth 2 (two) times daily.  60 tablet  3  . HYDROcodone-acetaminophen (NORCO/VICODIN) 5-325 MG per tablet Take 1-2 pills every 4-6 hours as needed for pain.  10 tablet  0  . Multiple Vitamins-Minerals (MULTIVITAMIN WITH MINERALS) tablet Take 1 tablet by mouth daily.        Marland Kitchen omeprazole (PRILOSEC) 40 MG capsule Take 40 mg by mouth 2 (two) times daily.       . pravastatin (PRAVACHOL) 40 MG tablet Take 40 mg by mouth at bedtime.       Marland Kitchen QUEtiapine (SEROQUEL XR) 50 MG TB24 Take 1 tablet (50 mg total) by mouth at bedtime.  30 tablet  5  . sertraline (ZOLOFT) 100 MG  tablet Take 150 mg by mouth daily.      . vitamin B-12 (CYANOCOBALAMIN) 1000 MCG tablet Take 1,000 mcg by mouth daily.      Marland Kitchen zonisamide (ZONEGRAN) 100 MG capsule Take 400 mg by mouth at bedtime.        No current facility-administered medications for this visit.    Lab Results: No results found for this or any previous visit (from the past 48 hour(s)).  Physical Findings: AIMS:  , ,  ,  ,    CIWA:    COWS:     Treatment Plan Summary: At this time we will go ahead and discontinue her Seroquel. She she'll continue on Zoloft 200 mg and Valium 5 mg twice a day. The patient will continue in one-to-one talking therapy and return to see me in 2 months.  Plan:  Medical Decision Making Problem Points:  Established problem, stable/improving (1) Data Points:  Review of new medications or change in dosage (2)  I certify that inpatient services furnished can reasonably be expected to improve the patient's condition.   Gloria Lewis 12/23/2012, 11:24 AM

## 2013-01-30 ENCOUNTER — Other Ambulatory Visit (HOSPITAL_COMMUNITY): Payer: Self-pay | Admitting: *Deleted

## 2013-01-30 ENCOUNTER — Telehealth (HOSPITAL_COMMUNITY): Payer: Self-pay | Admitting: *Deleted

## 2013-01-30 DIAGNOSIS — F331 Major depressive disorder, recurrent, moderate: Secondary | ICD-10-CM

## 2013-01-30 NOTE — Telephone Encounter (Signed)
Received refill request for Seroquel 50 mg Per MD note 12/23/12, this medication was discontinued Will forward to MD for review

## 2013-02-24 ENCOUNTER — Ambulatory Visit (HOSPITAL_COMMUNITY): Payer: Self-pay | Admitting: Psychiatry

## 2013-03-06 ENCOUNTER — Encounter (HOSPITAL_COMMUNITY): Payer: Self-pay | Admitting: Emergency Medicine

## 2013-03-06 ENCOUNTER — Emergency Department (HOSPITAL_COMMUNITY): Payer: Medicaid Other

## 2013-03-06 ENCOUNTER — Emergency Department (HOSPITAL_COMMUNITY)
Admission: EM | Admit: 2013-03-06 | Discharge: 2013-03-06 | Disposition: A | Discharge status: Home / Self Care | Payer: Medicaid Other | Attending: Emergency Medicine | Admitting: Emergency Medicine

## 2013-03-06 DIAGNOSIS — M1990 Unspecified osteoarthritis, unspecified site: Secondary | ICD-10-CM | POA: Insufficient documentation

## 2013-03-06 DIAGNOSIS — Z8659 Personal history of other mental and behavioral disorders: Secondary | ICD-10-CM | POA: Insufficient documentation

## 2013-03-06 DIAGNOSIS — Z862 Personal history of diseases of the blood and blood-forming organs and certain disorders involving the immune mechanism: Secondary | ICD-10-CM | POA: Insufficient documentation

## 2013-03-06 DIAGNOSIS — F17200 Nicotine dependence, unspecified, uncomplicated: Secondary | ICD-10-CM | POA: Insufficient documentation

## 2013-03-06 DIAGNOSIS — M545 Low back pain, unspecified: Secondary | ICD-10-CM | POA: Insufficient documentation

## 2013-03-06 DIAGNOSIS — K649 Unspecified hemorrhoids: Secondary | ICD-10-CM | POA: Insufficient documentation

## 2013-03-06 DIAGNOSIS — M79609 Pain in unspecified limb: Secondary | ICD-10-CM | POA: Insufficient documentation

## 2013-03-06 DIAGNOSIS — Z8639 Personal history of other endocrine, nutritional and metabolic disease: Secondary | ICD-10-CM | POA: Insufficient documentation

## 2013-03-06 DIAGNOSIS — Z860100 Personal history of colon polyps, unspecified: Secondary | ICD-10-CM | POA: Insufficient documentation

## 2013-03-06 DIAGNOSIS — N8111 Cystocele, midline: Secondary | ICD-10-CM | POA: Insufficient documentation

## 2013-03-06 DIAGNOSIS — F411 Generalized anxiety disorder: Secondary | ICD-10-CM | POA: Insufficient documentation

## 2013-03-06 DIAGNOSIS — G43909 Migraine, unspecified, not intractable, without status migrainosus: Secondary | ICD-10-CM | POA: Insufficient documentation

## 2013-03-06 DIAGNOSIS — G8929 Other chronic pain: Secondary | ICD-10-CM | POA: Insufficient documentation

## 2013-03-06 DIAGNOSIS — IMO0002 Reserved for concepts with insufficient information to code with codable children: Secondary | ICD-10-CM

## 2013-03-06 DIAGNOSIS — K219 Gastro-esophageal reflux disease without esophagitis: Secondary | ICD-10-CM | POA: Insufficient documentation

## 2013-03-06 DIAGNOSIS — Z880 Allergy status to penicillin: Secondary | ICD-10-CM | POA: Insufficient documentation

## 2013-03-06 DIAGNOSIS — I10 Essential (primary) hypertension: Secondary | ICD-10-CM | POA: Insufficient documentation

## 2013-03-06 LAB — CBC WITH DIFFERENTIAL/PLATELET
Basophils Absolute: 0 10*3/uL (ref 0.0–0.1)
Basophils Relative: 0 % (ref 0–1)
Eosinophils Absolute: 0.2 10*3/uL (ref 0.0–0.7)
Eosinophils Relative: 3 % (ref 0–5)
HCT: 38.9 % (ref 36.0–46.0)
MCH: 29.3 pg (ref 26.0–34.0)
MCHC: 33.9 g/dL (ref 30.0–36.0)
MCV: 86.4 fL (ref 78.0–100.0)
Monocytes Absolute: 0.5 10*3/uL (ref 0.1–1.0)
Monocytes Relative: 7 % (ref 3–12)
Neutro Abs: 2.7 10*3/uL (ref 1.7–7.7)
RDW: 14 % (ref 11.5–15.5)

## 2013-03-06 LAB — URINALYSIS, ROUTINE W REFLEX MICROSCOPIC
Glucose, UA: NEGATIVE mg/dL
Nitrite: NEGATIVE
pH: 7.5 (ref 5.0–8.0)

## 2013-03-06 LAB — LIPASE, BLOOD: Lipase: 76 U/L — ABNORMAL HIGH (ref 11–59)

## 2013-03-06 LAB — URINE MICROSCOPIC-ADD ON

## 2013-03-06 LAB — COMPREHENSIVE METABOLIC PANEL
AST: 18 U/L (ref 0–37)
Albumin: 3.9 g/dL (ref 3.5–5.2)
BUN: 12 mg/dL (ref 6–23)
Calcium: 9.7 mg/dL (ref 8.4–10.5)
Creatinine, Ser: 0.84 mg/dL (ref 0.50–1.10)
Total Protein: 7.2 g/dL (ref 6.0–8.3)

## 2013-03-06 MED ORDER — OXYCODONE-ACETAMINOPHEN 5-325 MG PO TABS
1.0000 | ORAL_TABLET | Freq: Once | ORAL | Status: AC
  Administered 2013-03-06: 1 via ORAL
  Filled 2013-03-06: qty 1

## 2013-03-06 MED ORDER — ONDANSETRON HCL 4 MG/2ML IJ SOLN
4.0000 mg | Freq: Once | INTRAMUSCULAR | Status: AC
  Administered 2013-03-06: 4 mg via INTRAVENOUS
  Filled 2013-03-06: qty 2

## 2013-03-06 MED ORDER — SODIUM CHLORIDE 0.9 % IV BOLUS (SEPSIS)
1000.0000 mL | Freq: Once | INTRAVENOUS | Status: AC
  Administered 2013-03-06: 1000 mL via INTRAVENOUS

## 2013-03-06 MED ORDER — MORPHINE SULFATE 4 MG/ML IJ SOLN
4.0000 mg | Freq: Once | INTRAMUSCULAR | Status: DC
  Filled 2013-03-06: qty 1

## 2013-03-06 MED ORDER — KETOROLAC TROMETHAMINE 30 MG/ML IJ SOLN
30.0000 mg | Freq: Once | INTRAMUSCULAR | Status: AC
  Administered 2013-03-06: 30 mg via INTRAVENOUS
  Filled 2013-03-06: qty 1

## 2013-03-06 MED ORDER — LIDOCAINE HCL 2 % EX GEL: 1.0000 "application " | CUTANEOUS | Status: DC | PRN

## 2013-03-06 MED ORDER — MORPHINE SULFATE 4 MG/ML IJ SOLN
4.0000 mg | Freq: Once | INTRAMUSCULAR | Status: AC
  Administered 2013-03-06: 4 mg via INTRAVENOUS
  Filled 2013-03-06: qty 1

## 2013-03-06 MED ORDER — LIDOCAINE HCL 2 % EX GEL
Freq: Once | CUTANEOUS | Status: AC
  Administered 2013-03-06: 10 via TOPICAL
  Filled 2013-03-06: qty 10

## 2013-03-06 MED ORDER — OXYCODONE-ACETAMINOPHEN 5-325 MG PO TABS: 1.0000 | ORAL_TABLET | Freq: Four times a day (QID) | ORAL | Status: DC | PRN

## 2013-03-06 NOTE — ED Notes (Signed)
Patient reports that  She has had low back pain since 01/29/13. Today, patient reports diarrhea, left mid abdominal pain and rectal pain from a hemorrhoid. Patient then reported that she was told she had a uterine prolapse by a OB/GYN physician 5 days ago.

## 2013-03-06 NOTE — ED Provider Notes (Signed)
CSN: [id number]     Arrival date & time 03/06/13  1256 History   First MD Initiated Contact with Patient 03/06/13 1511     Chief Complaint  Patient presents with  . Leg Pain  . Back Pain  . Hemorrhoids   (Consider location/radiation/quality/duration/timing/severity/associated sxs/prior Treatment) HPI  Gloria Lewis is a 57 y.o.female with multiple medical problems presents to the ER by EMS with complaints of reoccurrence of hemorrhoid pain, exacerbation of her chronic low back pain and wants a definitive treatment for her bladder prolapse.   Hemorrhoids- Patient has not had hemorrhoids for the past 5 years but developed some itching last night. She had two loose bowel movements this morning and has since developed rectal pain. She has noted no bleeding. The pain feels like her previous hx of hemorrhoids.  Back pain- Patient is seeing a provider for her chronic back pain and has two therapies of "nerve burning" by a physician. She says that this is not helping and is actually making her pain worse because she know has pain going down her right leg as well. She spoke with her physician who says the two are unrelated but he will see her for a follow-up appointment which she already has scheduled. She denies weakness (generalize or focal), numbness, tingling, rash, induration. She describes the pain has throbbing and it alleviates when she rubs it. Takes Norco for this pain but it is not helping.  Bladder Prolapse - Saw her Gynecologist this past Wednesday for general exam and PAP smear. All of her blood work came back normal aside from her sugar being elevated. She also reports that the gynecologist told her that she has a cystocele. The symptoms of pressure has not changed since her appointment. She has had these symptoms for months. She brings it up because should like me to fix it today. No urinary retention or urinary symptoms.   Past Medical History  Diagnosis Date  . HTN (hypertension)     . Bipolar 1 disorder   . OA (osteoarthritis)   . GERD (gastroesophageal reflux disease)   . Hypercholesterolemia   . Fever blister   . HA (headache)   . Colon polyp   . CTS (carpal tunnel syndrome)   . Schizo-affective psychosis   . Anxiety   . Depression   . Migraines    Past Surgical History  Procedure Laterality Date  . Neck surgery  2009  . Carpal tunnel release  20110 rt/lt  . Polp removed  2011  . Back injection    . 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 Anterior cervical decomp/discectomy fusion  08/27/2011    Procedure: ANTERIOR CERVICAL DECOMPRESSION/DISCECTOMY FUSION 1 LEVEL/HARDWARE REMOVAL;  Surgeon: Tia Alert, MD;  Location: [facility];  Service: Neurosurgery;  Laterality: Bilateral;  Cervical four-five Anterior cervical decompression/diskectomy, fusion, Plate, Removal of Cervical five-seven Plate   Family History  Problem Relation Age of Onset  . Coronary artery disease Father   . Hypertension Mother   . Diabetes Mother   . Schizophrenia Mother   . Depression Brother   . Prostate cancer Brother   . Anesthesia problems Neg Hx   . Hypotension Neg Hx   . Malignant hyperthermia Neg Hx   . Pseudochol deficiency Neg Hx    History  Substance Use Topics  . Smoking status: Current Some Day Smoker -- 0.25 packs/day for 30 years    Types: Cigarettes  .  Smokeless tobacco: Never Used  . Alcohol Use: No   OB History   Grav Para Term Preterm Abortions TAB SAB Ect Mult Living                 Review of Systems  Review of Systems  Gen: no weight loss, fevers, chills, night sweats  Eyes: no discharge or drainage, no occular pain or visual changes  Nose: no epistaxis or rhinorrhea  Mouth: no dental pain, no sore throat  Neck: no neck pain  Lungs:No wheezing, coughing or hemoptysis CV: no chest pain, palpitations, dependent edema or orthopnea  Abd: no abdominal pain, nausea, vomiting  GU: no dysuria or gross hematuria   +cystocele + hemorrhoids.  MSK:  + back pain  Neuro: no headache, no focal neurologic deficits  Skin: no abnormalities Psyche: negative.   Allergies  Amoxicillin; Chantix; Effexor; Norco; Paroxetine hcl; Penicillins; Zithromax; and Hydrocodone  Home Medications   Current Outpatient Rx  Name  Route  Sig  Dispense  Refill  . chlorzoxazone (PARAFON) 500 MG tablet   Oral   Take 500 mg by mouth 4 (four) times daily as needed for muscle spasms.         . diazepam (VALIUM) 5 MG tablet   Oral   Take 1 tablet (5 mg total) by mouth 2 (two) times daily.   60 tablet   3   . HYDROcodone-acetaminophen (NORCO/VICODIN) 5-325 MG per tablet      Take 1-2 pills every 4-6 hours as needed for pain.   10 tablet   0   . Multiple Vitamins-Minerals (MULTIVITAMIN WITH MINERALS) tablet   Oral   Take 1 tablet by mouth daily.           Marland Kitchen omeprazole (PRILOSEC) 40 MG capsule   Oral   Take 40 mg by mouth daily.         . vitamin B-12 (CYANOCOBALAMIN) 1000 MCG tablet   Oral   Take 1,000 mcg by mouth daily.         Marland Kitchen zonisamide (ZONEGRAN) 100 MG capsule   Oral   Take 400 mg by mouth at bedtime.          . lidocaine (XYLOCAINE JELLY) 2 % jelly   Topical   Apply 1 application topically as needed. To rectum   30 mL   0   . oxyCODONE-acetaminophen (PERCOCET/ROXICET) 5-325 MG per tablet   Oral   Take 1 tablet by mouth every 6 (six) hours as needed for severe pain.   25 tablet   0    BP 126/39  Pulse 60  Temp(Src) 98.8 F (37.1 C) (Oral)  Resp 16  Ht 5\' 7"  (1.702 m)  Wt 210 lb (95.255 kg)  BMI 32.88 kg/m2  SpO2 100% Physical Exam  Nursing note and vitals reviewed. Constitutional: She appears well-developed and well-nourished. No distress.  HENT:  Head: Normocephalic and atraumatic.  Eyes: Pupils are equal, round, and reactive to light.  Neck: Normal range of motion. Neck supple.  Cardiovascular: Normal rate and regular rhythm.   Pulmonary/Chest: Effort normal.    Abdominal: Soft.  Genitourinary:     Musculoskeletal:       Back:   Equal strength to bilateral lower extremities. Neurosensory function adequate to both legs. Skin color is normal. Skin is warm and moist. I see no step off deformity, no bony tenderness. Pt is able to ambulate without limp. Pain is relieved when sitting in certain positions. ROM is decreased due to  pain. No crepitus, laceration, effusion, swelling.  Pulses are normal   Neurological: She is alert.  Skin: Skin is warm and dry.    ED Course  Procedures (including critical care time) Labs Review Labs Reviewed  CBC WITH DIFFERENTIAL - Abnormal; Notable for the following:    Neutrophils Relative % 39 (*)    Lymphocytes Relative 51 (*)    All other components within normal limits  COMPREHENSIVE METABOLIC PANEL - Abnormal; Notable for the following:    Potassium 3.4 (*)    GFR calc non Af Amer 76 (*)    GFR calc Af Amer 88 (*)    All other components within normal limits  LIPASE, BLOOD - Abnormal; Notable for the following:    Lipase 76 (*)    All other components within normal limits  URINALYSIS, ROUTINE W REFLEX MICROSCOPIC - Abnormal; Notable for the following:    Color, Urine AMBER (*)    APPearance CLOUDY (*)    Leukocytes, UA TRACE (*)    All other components within normal limits  URINE MICROSCOPIC-ADD ON - Abnormal; Notable for the following:    Squamous Epithelial / LPF FEW (*)    Bacteria, UA MANY (*)    All other components within normal limits   Imaging Review Dg Lumbar Spine Complete  03/06/2013   CLINICAL DATA:  Right leg pain, back pain  EXAM: LUMBAR SPINE - COMPLETE 4+ VIEW  COMPARISON:  05/08/2011  FINDINGS: Normal alignment. Similar degenerative disc disease at L5-S1 with disc space loss, sclerosis and bony spurring. Mild lower lumbar facet arthropathy. No compression fracture. No pars defects. Normal pedicles and SI joints.  IMPRESSION: L5-S1 degenerative disease.  Stable exam.  No interval  change.   Electronically Signed   By: Ruel Favors M.D.   On: 03/06/2013 16:14    EKG Interpretation   None       MDM   1. Hemorrhoids   2. Cystocele   3. Low back pain    Patients pain easily managed with lido jelly and IV pain medication. No emergent conditions present that requires further treatment at this time.  57 y.o.Gloria Lewis's  with back pain. No neurological deficits and normal neuro exam. Patient can walk but states is painful. No loss of bowel or bladder control. No concern for cauda equina. No fever, night sweats, weight loss, h/o cancer, IVDU. RICE protocol and pain medicine indicated and discussed with patient.   Patient Plan 1. Medications: narcotic pain medicationr and usual home medications.  2. Treatment: rest, drink plenty of fluids, gentle stretching as discussed, alternate ice and heat  3. Follow Up: Please followup with your primary doctor for discussion of your diagnoses and further evaluation after today's visit; if you do not have a primary care doctor use the resource guide provided 4. Lido jelly for hemorrhoids. to find one 5. F/u with PCP for cystocele.    Vital signs are stable at discharge. Filed Vitals:   03/06/13 1818  BP: 126/39  Pulse: 60  Temp: 98.8 F (37.1 C)  Resp: 16    Patient/guardian has voiced understanding and agreed to follow-up with the PCP or specialist.        Dorthula Matas, PA-C 03/06/13 1954

## 2013-03-06 NOTE — ED Notes (Signed)
Bed: [facility]4 Expected date:  Expected time:  Means of arrival:  Comments: ems- chest pain

## 2013-03-08 LAB — URINE CULTURE
Colony Count: NO GROWTH
Culture: NO GROWTH

## 2013-03-09 NOTE — ED Provider Notes (Signed)
Medical screening examination/treatment/procedure(s) were performed by non-physician practitioner and as supervising physician I was immediately available for consultation/collaboration.  EKG Interpretation   None        Shon Baton, MD 03/09/13 1544

## 2013-04-11 ENCOUNTER — Emergency Department (HOSPITAL_COMMUNITY)
Admission: EM | Admit: 2013-04-11 | Discharge: 2013-04-11 | Disposition: A | Discharge status: Home / Self Care | Payer: Medicaid Other | Attending: Emergency Medicine | Admitting: Emergency Medicine

## 2013-04-11 ENCOUNTER — Encounter (HOSPITAL_COMMUNITY): Payer: Self-pay | Admitting: Emergency Medicine

## 2013-04-11 ENCOUNTER — Emergency Department (HOSPITAL_COMMUNITY): Payer: Medicaid Other

## 2013-04-11 DIAGNOSIS — F17200 Nicotine dependence, unspecified, uncomplicated: Secondary | ICD-10-CM | POA: Insufficient documentation

## 2013-04-11 DIAGNOSIS — Z860100 Personal history of colon polyps, unspecified: Secondary | ICD-10-CM | POA: Insufficient documentation

## 2013-04-11 DIAGNOSIS — Z79899 Other long term (current) drug therapy: Secondary | ICD-10-CM | POA: Insufficient documentation

## 2013-04-11 DIAGNOSIS — Z8659 Personal history of other mental and behavioral disorders: Secondary | ICD-10-CM | POA: Insufficient documentation

## 2013-04-11 DIAGNOSIS — Z880 Allergy status to penicillin: Secondary | ICD-10-CM | POA: Insufficient documentation

## 2013-04-11 DIAGNOSIS — M1990 Unspecified osteoarthritis, unspecified site: Secondary | ICD-10-CM | POA: Insufficient documentation

## 2013-04-11 DIAGNOSIS — E7800 Pure hypercholesterolemia, unspecified: Secondary | ICD-10-CM | POA: Insufficient documentation

## 2013-04-11 DIAGNOSIS — K219 Gastro-esophageal reflux disease without esophagitis: Secondary | ICD-10-CM | POA: Insufficient documentation

## 2013-04-11 DIAGNOSIS — I10 Essential (primary) hypertension: Secondary | ICD-10-CM | POA: Insufficient documentation

## 2013-04-11 DIAGNOSIS — Z8619 Personal history of other infectious and parasitic diseases: Secondary | ICD-10-CM | POA: Insufficient documentation

## 2013-04-11 DIAGNOSIS — G43909 Migraine, unspecified, not intractable, without status migrainosus: Secondary | ICD-10-CM

## 2013-04-11 DIAGNOSIS — F411 Generalized anxiety disorder: Secondary | ICD-10-CM | POA: Insufficient documentation

## 2013-04-11 DIAGNOSIS — M546 Pain in thoracic spine: Secondary | ICD-10-CM

## 2013-04-11 LAB — URINALYSIS, ROUTINE W REFLEX MICROSCOPIC
Bilirubin Urine: NEGATIVE
GLUCOSE, UA: NEGATIVE mg/dL
Hgb urine dipstick: NEGATIVE
KETONES UR: NEGATIVE mg/dL
Leukocytes, UA: NEGATIVE
Nitrite: NEGATIVE
PROTEIN: NEGATIVE mg/dL
Specific Gravity, Urine: 1.009 (ref 1.005–1.030)
UROBILINOGEN UA: 0.2 mg/dL (ref 0.0–1.0)
pH: 7.5 (ref 5.0–8.0)

## 2013-04-11 LAB — CBC WITH DIFFERENTIAL/PLATELET
BASOS ABS: 0 10*3/uL (ref 0.0–0.1)
BASOS PCT: 0 % (ref 0–1)
EOS PCT: 3 % (ref 0–5)
Eosinophils Absolute: 0.2 10*3/uL (ref 0.0–0.7)
HEMATOCRIT: 41.7 % (ref 36.0–46.0)
Hemoglobin: 14.3 g/dL (ref 12.0–15.0)
LYMPHS PCT: 44 % (ref 12–46)
Lymphs Abs: 3 10*3/uL (ref 0.7–4.0)
MCH: 29.4 pg (ref 26.0–34.0)
MCHC: 34.3 g/dL (ref 30.0–36.0)
MCV: 85.8 fL (ref 78.0–100.0)
MONO ABS: 0.4 10*3/uL (ref 0.1–1.0)
Monocytes Relative: 6 % (ref 3–12)
Neutro Abs: 3.1 10*3/uL (ref 1.7–7.7)
Neutrophils Relative %: 47 % (ref 43–77)
PLATELETS: 389 10*3/uL (ref 150–400)
RBC: 4.86 MIL/uL (ref 3.87–5.11)
RDW: 13.9 % (ref 11.5–15.5)
WBC: 6.7 10*3/uL (ref 4.0–10.5)

## 2013-04-11 LAB — COMPREHENSIVE METABOLIC PANEL
ALBUMIN: 4.3 g/dL (ref 3.5–5.2)
ALT: 13 U/L (ref 0–35)
AST: 20 U/L (ref 0–37)
Alkaline Phosphatase: 111 U/L (ref 39–117)
BUN: 9 mg/dL (ref 6–23)
CALCIUM: 10 mg/dL (ref 8.4–10.5)
CO2: 26 mEq/L (ref 19–32)
Chloride: 100 mEq/L (ref 96–112)
Creatinine, Ser: 0.96 mg/dL (ref 0.50–1.10)
GFR calc Af Amer: 75 mL/min — ABNORMAL LOW (ref >90)
GFR calc non Af Amer: 65 mL/min — ABNORMAL LOW (ref >90)
Glucose, Bld: 91 mg/dL (ref 70–99)
Potassium: 3.7 mEq/L (ref 3.7–5.3)
SODIUM: 140 meq/L (ref 137–147)
Total Bilirubin: 0.3 mg/dL (ref 0.3–1.2)
Total Protein: 8.1 g/dL (ref 6.0–8.3)

## 2013-04-11 MED ORDER — DEXAMETHASONE SODIUM PHOSPHATE 10 MG/ML IJ SOLN
10.0000 mg | Freq: Once | INTRAMUSCULAR | Status: AC
  Administered 2013-04-11: 10 mg via INTRAVENOUS
  Filled 2013-04-11: qty 1

## 2013-04-11 MED ORDER — METOCLOPRAMIDE HCL 5 MG/ML IJ SOLN
10.0000 mg | Freq: Once | INTRAMUSCULAR | Status: AC
  Administered 2013-04-11: 10 mg via INTRAVENOUS
  Filled 2013-04-11: qty 2

## 2013-04-11 MED ORDER — OXYCODONE-ACETAMINOPHEN 5-325 MG PO TABS: 1.0000 | ORAL_TABLET | Freq: Four times a day (QID) | ORAL | Status: DC | PRN

## 2013-04-11 MED ORDER — DIPHENHYDRAMINE HCL 50 MG/ML IJ SOLN
25.0000 mg | Freq: Once | INTRAMUSCULAR | Status: AC
  Administered 2013-04-11: 25 mg via INTRAVENOUS
  Filled 2013-04-11: qty 1

## 2013-04-11 MED ORDER — KETOROLAC TROMETHAMINE 30 MG/ML IJ SOLN
30.0000 mg | Freq: Once | INTRAMUSCULAR | Status: AC
  Administered 2013-04-11: 30 mg via INTRAVENOUS
  Filled 2013-04-11: qty 1

## 2013-04-11 MED ORDER — SODIUM CHLORIDE 0.9 % IV BOLUS (SEPSIS)
1000.0000 mL | Freq: Once | INTRAVENOUS | Status: AC
  Administered 2013-04-11: 1000 mL via INTRAVENOUS

## 2013-04-11 NOTE — Discharge Instructions (Signed)
Back Exercises Back exercises help treat and prevent back injuries. The goal of back exercises is to increase the strength of your abdominal and back muscles and the flexibility of your back. These exercises should be started when you no longer have back pain. Back exercises include:  Pelvic Tilt. Lie on your back with your knees bent. Tilt your pelvis until the lower part of your back is against the floor. Hold this position 5 to 10 sec and repeat 5 to 10 times.  Knee to Chest. Pull first 1 knee up against your chest and hold for 20 to 30 seconds, repeat this with the other knee, and then both knees. This may be done with the other leg straight or bent, whichever feels better.  Sit-Ups or Curl-Ups. Bend your knees 90 degrees. Start with tilting your pelvis, and do a partial, slow sit-up, lifting your trunk only 30 to 45 degrees off the floor. Take at least 2 to 3 seconds for each sit-up. Do not do sit-ups with your knees out straight. If partial sit-ups are difficult, simply do the above but with only tightening your abdominal muscles and holding it as directed.  Hip-Lift. Lie on your back with your knees flexed 90 degrees. Push down with your feet and shoulders as you raise your hips a couple inches off the floor; hold for 10 seconds, repeat 5 to 10 times.  Back arches. Lie on your stomach, propping yourself up on bent elbows. Slowly press on your hands, causing an arch in your low back. Repeat 3 to 5 times. Any initial stiffness and discomfort should lessen with repetition over time.  Shoulder-Lifts. Lie face down with arms beside your body. Keep hips and torso pressed to floor as you slowly lift your head and shoulders off the floor. Do not overdo your exercises, especially in the beginning. Exercises may cause you some mild back discomfort which lasts for a few minutes; however, if the pain is more severe, or lasts for more than 15 minutes, do not continue exercises until you see your caregiver.  Improvement with exercise therapy for back problems is slow.  See your caregivers for assistance with developing a proper back exercise program. Document Released: 04/30/2004 Document Revised: 06/15/2011 Document Reviewed: 01/22/2011 [facility] Patient Information 2014 [place]. Migraine Headache A migraine headache is an intense, throbbing pain on one or both sides of your head. A migraine can last for 30 minutes to several hours. CAUSES  The exact cause of a migraine headache is not always known. However, a migraine may be caused when nerves in the brain become irritated and release chemicals that cause inflammation. This causes pain. SYMPTOMS  Pain on one or both sides of your head.  Pulsating or throbbing pain.  Severe pain that prevents daily activities.  Pain that is aggravated by any physical activity.  Nausea, vomiting, or both.  Dizziness.  Pain with exposure to bright lights, loud noises, or activity.  General sensitivity to bright lights, loud noises, or smells. Before you get a migraine, you may get warning signs that a migraine is coming (aura). An aura may include:  Seeing flashing lights.  Seeing bright spots, halos, or zig-zag lines.  Having tunnel vision or blurred vision.  Having feelings of numbness or tingling.  Having trouble talking.  Having muscle weakness. MIGRAINE TRIGGERS  Alcohol.  Smoking.  Stress.  Menstruation.  Aged cheeses.  Foods or drinks that contain nitrates, glutamate, aspartame, or tyramine.  Lack of sleep.  Chocolate.  Caffeine.  Hunger.  Physical exertion.  Fatigue.  Medicines used to treat chest pain (nitroglycerine), birth control pills, estrogen, and some blood pressure medicines. DIAGNOSIS  A migraine headache is often diagnosed based on:  Symptoms.  Physical examination.  A CT scan or MRI of your head. TREATMENT Medicines may be given for pain and nausea. Medicines can also be given to help  prevent recurrent migraines.  HOME CARE INSTRUCTIONS  Only take over-the-counter or prescription medicines for pain or discomfort as directed by your caregiver. The use of long-term narcotics is not recommended.  Lie down in a dark, quiet room when you have a migraine.  Keep a journal to find out what may trigger your migraine headaches. For example, write down:  What you eat and drink.  How much sleep you get.  Any change to your diet or medicines.  Limit alcohol consumption.  Quit smoking if you smoke.  Get 7 to 9 hours of sleep, or as recommended by your caregiver.  Limit stress.  Keep lights dim if bright lights bother you and make your migraines worse. SEEK IMMEDIATE MEDICAL CARE IF:   Your migraine becomes severe.  You have a fever.  You have a stiff neck.  You have vision loss.  You have muscular weakness or loss of muscle control.  You start losing your balance or have trouble walking.  You feel faint or pass out.  You have severe symptoms that are different from your first symptoms. MAKE SURE YOU:   Understand these instructions.  Will watch your condition.  Will get help right away if you are not doing well or get worse. Document Released: 03/23/2005 Document Revised: 06/15/2011 Document Reviewed: 03/13/2011 [facility] Patient Information 2014 [place], [place].

## 2013-04-11 NOTE — ED Notes (Signed)
Pt c/o right sided flank pain x 1 month and migraine x 1 week. C/o frequent urination.

## 2013-04-11 NOTE — ED Provider Notes (Signed)
CSN: [id number]     Arrival date & time 04/11/13  1215 History   First MD Initiated Contact with Patient 04/11/13 [phone]     Chief Complaint  Patient presents with  . Flank Pain  . Migraine   (Consider location/radiation/quality/duration/timing/severity/associated sxs/prior Treatment) Patient is a 58 y.o. female presenting with migraines and back pain. The history is provided by the patient. No language interpreter was used.  Migraine Associated symptoms include headaches. Pertinent negatives include no chest pain, no abdominal pain and no shortness of breath.  Back Pain Location:  Thoracic spine Quality:  Aching Radiates to:  Does not radiate Pain severity:  Moderate Pain is:  Worse during the day Duration:  1 month Timing:  Intermittent Progression:  Waxing and waning Chronicity:  New Relieved by:  Lying down Worsened by:  Movement, twisting, bending and standing Ineffective treatments:  Lying down Associated symptoms: headaches   Associated symptoms: no abdominal pain, no abdominal swelling, no bladder incontinence, no bowel incontinence, no chest pain, no dysuria, no fever, no numbness, no paresthesias, no pelvic pain, no perianal numbness, no tingling, no weakness and no weight loss   Headaches:    Severity:  Severe   Duration:  2 weeks   Timing:  Constant   Progression:  Unchanged   Chronicity:  Recurrent   Past Medical History  Diagnosis Date  . HTN (hypertension)   . Bipolar 1 disorder   . OA (osteoarthritis)   . GERD (gastroesophageal reflux disease)   . Hypercholesterolemia   . Fever blister   . HA (headache)   . Colon polyp   . CTS (carpal tunnel syndrome)   . Schizo-affective psychosis   . Anxiety   . Depression   . Migraines    Past Surgical History  Procedure Laterality Date  . Neck surgery  2009  . Carpal tunnel release  20110 rt/lt  . Polp removed  2011  . Back injection    . Pituitary surgery      Had gland removed from producing too much  calcium  . Anterior cervical decomp/discectomy fusion  08/27/2011    Procedure: ANTERIOR CERVICAL DECOMPRESSION/DISCECTOMY FUSION 1 LEVEL/HARDWARE REMOVAL;  Surgeon: Eustace Moore, MD;  Location: [facility];  Service: Neurosurgery;  Laterality: Bilateral;  Cervical four-five Anterior cervical decompression/diskectomy, fusion, Plate, Removal of Cervical five-seven Plate   Family History  Problem Relation Age of Onset  . Coronary artery disease Father   . Hypertension Mother   . Diabetes Mother   . Schizophrenia Mother   . Depression Brother   . Prostate cancer Brother   . Anesthesia problems Neg Hx   . Hypotension Neg Hx   . Malignant hyperthermia Neg Hx   . Pseudochol deficiency Neg Hx    History  Substance Use Topics  . Smoking status: Current Some Day Smoker -- 0.25 packs/day for 30 years    Types: Cigarettes  . Smokeless tobacco: Never Used  . Alcohol Use: No   OB History   Grav Para Term Preterm Abortions TAB SAB Ect Mult Living                 Review of Systems  Constitutional: Negative for fever, chills, weight loss, diaphoresis, activity change, appetite change and fatigue.  HENT: Negative for congestion, facial swelling, rhinorrhea and sore throat.   Eyes: Negative for photophobia and discharge.  Respiratory: Negative for cough, chest tightness and shortness of breath.   Cardiovascular: Negative for chest pain, palpitations and leg swelling.  Gastrointestinal: Negative for nausea, vomiting, abdominal pain, diarrhea and bowel incontinence.  Endocrine: Negative for polydipsia and polyuria.  Genitourinary: Positive for flank pain. Negative for bladder incontinence, dysuria, frequency, difficulty urinating and pelvic pain.  Musculoskeletal: Positive for back pain. Negative for arthralgias, neck pain and neck stiffness.  Skin: Negative for color change and wound.  Allergic/Immunologic: Negative for immunocompromised state.  Neurological: Positive for headaches. Negative  for tingling, facial asymmetry, weakness, numbness and paresthesias.  Hematological: Does not bruise/bleed easily.  Psychiatric/Behavioral: Negative for confusion and agitation.    Allergies  Amoxicillin; Chantix; Effexor; Norco; Paroxetine hcl; Penicillins; Zithromax; and Hydrocodone  Home Medications   Current Outpatient Rx  Name  Route  Sig  Dispense  Refill  . chlorzoxazone (PARAFON) 500 MG tablet   Oral   Take 500 mg by mouth 4 (four) times daily as needed (migraine).         . diazepam (VALIUM) 5 MG tablet   Oral   Take 5 mg by mouth every 12 (twelve) hours as needed for anxiety.         Marland Kitchen HYDROcodone-acetaminophen (NORCO/VICODIN) 5-325 MG per tablet   Oral   Take 1-2 tablets by mouth every 6 (six) hours as needed for moderate pain or severe pain.         . Multiple Vitamins-Minerals (MULTIVITAMIN WITH MINERALS) tablet   Oral   Take 1 tablet by mouth every morning.          Marland Kitchen omeprazole (PRILOSEC) 40 MG capsule   Oral   Take 40 mg by mouth every morning.          Marland Kitchen oxyCODONE-acetaminophen (PERCOCET/ROXICET) 5-325 MG per tablet   Oral   Take 1 tablet by mouth every 6 (six) hours as needed for severe pain.   25 tablet   0   . triamterene-hydrochlorothiazide (DYAZIDE) 37.5-25 MG per capsule   Oral   Take 1 capsule by mouth every morning.         . valACYclovir (VALTREX) 500 MG tablet   Oral   Take 500 mg by mouth 2 (two) times daily as needed (virus flare).         . vitamin B-12 (CYANOCOBALAMIN) 1000 MCG tablet   Oral   Take 1,000 mcg by mouth daily.         Marland Kitchen zonisamide (ZONEGRAN) 100 MG capsule   Oral   Take 400 mg by mouth at bedtime.          Marland Kitchen oxyCODONE-acetaminophen (PERCOCET) 5-325 MG per tablet   Oral   Take 1 tablet by mouth every 6 (six) hours as needed.   10 tablet   0    BP 148/82  Pulse 64  Temp(Src) 98 F (36.7 C) (Oral)  Resp 20  SpO2 99% Physical Exam  Constitutional: She is oriented to person, place, and time.  She appears well-developed and well-nourished. No distress.  HENT:  Head: Normocephalic and atraumatic.  Mouth/Throat: No oropharyngeal exudate.  Eyes: Pupils are equal, round, and reactive to light.  Neck: Normal range of motion. Neck supple.  Cardiovascular: Normal rate, regular rhythm and normal heart sounds.  Exam reveals no gallop and no friction rub.   No murmur heard. Pulmonary/Chest: Effort normal and breath sounds normal. No respiratory distress. She has no wheezes. She has no rales.  Abdominal: Soft. Bowel sounds are normal. She exhibits no distension and no mass. There is no tenderness. There is no rebound and no guarding.  Musculoskeletal: Normal range of  motion. She exhibits no edema and no tenderness.  Neurological: She is alert and oriented to person, place, and time. She has normal strength. No cranial nerve deficit or sensory deficit. She exhibits normal muscle tone. She displays a negative Romberg sign. Coordination and gait normal. GCS eye subscore is 4. GCS verbal subscore is 5. GCS motor subscore is 6.  Skin: Skin is warm and dry.  Psychiatric: She has a normal mood and affect.    ED Course  Procedures (including critical care time) Labs Review Labs Reviewed  COMPREHENSIVE METABOLIC PANEL - Abnormal; Notable for the following:    GFR calc non Af Amer 65 (*)    GFR calc Af Amer 75 (*)    All other components within normal limits  URINALYSIS, ROUTINE W REFLEX MICROSCOPIC  CBC WITH DIFFERENTIAL   Imaging Review Ct Head Wo Contrast  04/11/2013   CLINICAL DATA:  Frontal headache and blurred vision  EXAM: CT HEAD WITHOUT CONTRAST  TECHNIQUE: Contiguous axial images were obtained from the base of the skull through the vertex without intravenous contrast. Study was obtained within 24 hr of patient's arrival at the emergency department.  COMPARISON:  None.  FINDINGS: The ventricles are normal in size and configuration. There is no mass, hemorrhage, extra-axial fluid  collection, or midline shift. There is slight small vessel disease in the anterior centra semiovale bilaterally. Elsewhere gray-white compartments appear normal. No acute infarct is apparent.  Bony calvarium appears intact.  The mastoid air cells are clear.  IMPRESSION: Mild periventricular small vessel disease. No intracranial mass, hemorrhage, or acute appearing infarct.   Electronically Signed   By: Lowella Grip M.D.   On: 04/11/2013 17:29    EKG Interpretation   None       MDM   1. Thoracic back pain   2. Migraine    Pt is a 58 y.o. female with Pmhx as above who presents with 2 weeks of constant, frontal, migraine-type h/a similar to prior, though longer lasting w/ assoc photo/phonophobia, blurry vision, n/v.  She has second complaint of about 1 week of mid back pain worse w/ standing, bending, twisting, better w/ rest w/o known trauma. Denies dysuria, hematuria, she has urinary frequency at baseline. On PE, VSS, pt in NAD.  Neuro exam benign. +ttp across mid back.  Triage labs & UA unremarkable.  Doubt kidney stone, pyelo, cord compression, and feel pain likely MSK.  Will treat h/a w/ migraine cocktail.  Given prolonged course CT head added which was negative.    5:54PM H/a feeling improved.  I feel she is safe for d/c home w/ short course of percocet for pain and close PCP f/u.  Return precautions given for new or worsening symptoms including worsening pain, numbness, weakness, confusion.        Neta Ehlers, MD 04/12/13 1110

## 2013-04-11 NOTE — ED Notes (Signed)
MD at bedside. 

## 2013-04-11 NOTE — ED Notes (Signed)
Patient transported to CT 

## 2013-04-21 ENCOUNTER — Other Ambulatory Visit (HOSPITAL_COMMUNITY): Payer: Self-pay | Admitting: Psychiatry

## 2013-04-21 MED ORDER — DIAZEPAM 5 MG PO TABS: 5.0000 mg | ORAL_TABLET | Freq: Two times a day (BID) | ORAL | Status: DC | PRN

## 2013-06-15 ENCOUNTER — Telehealth (HOSPITAL_COMMUNITY): Payer: Self-pay | Admitting: *Deleted

## 2013-06-15 ENCOUNTER — Ambulatory Visit (INDEPENDENT_AMBULATORY_CARE_PROVIDER_SITE_OTHER): Payer: Federal, State, Local not specified - Other | Admitting: Psychiatry

## 2013-06-15 VITALS — BP 121/70 | HR 81 | Ht 67.25 in | Wt 215.0 lb

## 2013-06-15 DIAGNOSIS — F321 Major depressive disorder, single episode, moderate: Secondary | ICD-10-CM

## 2013-06-15 DIAGNOSIS — F3160 Bipolar disorder, current episode mixed, unspecified: Secondary | ICD-10-CM

## 2013-06-15 DIAGNOSIS — F332 Major depressive disorder, recurrent severe without psychotic features: Secondary | ICD-10-CM

## 2013-06-15 MED ORDER — DIAZEPAM 5 MG PO TABS: ORAL_TABLET | ORAL | Status: DC

## 2013-06-15 MED ORDER — TEMAZEPAM 15 MG PO CAPS: 15.0000 mg | ORAL_CAPSULE | Freq: Every evening | ORAL | Status: DC | PRN

## 2013-06-15 NOTE — Telephone Encounter (Signed)
Patient left VM:One of her prescriptions needs something from Medicaid so she can get it.Needs it tonite so she can sleep. [facility] is going to call office. Patient left VM:Has to have medicaid approve one of her medicines.Also forgot to tell Dr. Casimiro Needle that she needed note for missing class tonite.They talked about her stress level.She will be out of school for a week.

## 2013-06-15 NOTE — Progress Notes (Signed)
[facility] MD Progress Note  06/15/2013 1:20 PM Gloria Lewis  MRN:  [id number] Subjective: Not doing well The patient is overwhelmed. Jessica care financial circumstances for many people. Her son has recently been arrested for marijuana use. Her closest church member died this morning. The patient is closed overwhelmed. She just finished classes in the past. She feels anxious and very very curable. The patient is not sleeping. She is eating fairly well. She shows no evidence of psychosis. She is not suicidal. Diagnosis:   DSM5: Schizophrenia Disorders:   Obsessive-Compulsive Disorders:   Trauma-Stressor Disorders:   Substance/Addictive Disorders:   Depressive Disorders:  Major Depressive Disorder - Severe (296.23) Total Time spent with patient: 15 minutes  Axis I: Bipolar, mixed, Major Depression, Recurrent severe and Major Depression, single episode  ADL's:  Intact  Sleep: Good  Appetite:  Good  Suicidal Ideation:  no Homicidal Ideation:  niooe AEB (as evidenced by):  Psychiatric Specialty Exam: Physical Exam  ROS  Blood pressure 121/70, pulse 81, height 5' 7.25" (1.708 m), weight 215 lb (97.523 kg).Body mass index is 33.43 kg/(m^2).  General Appearance: Casual  Eye Contact::  Absent  Speech:  Clear and Coherent  Volume:  Normal  Mood:  NA  Affect:  Blunt  Thought Process:  Coherent  Orientation:  Full (Time, Place, and Person)  Thought Content:  WDL  Suicidal Thoughts:  No  Homicidal Thoughts:  No  Memory:  NA  Judgement:  NA  Insight:  Good  Psychomotor Activity:  Increased  Concentration:  Good  Recall:  Good  Fund of Knowledge:Good  Language: Fair  Akathisia:  No  Handed:  Right  AIMS (if indicated):     Assets:  Desire for Improvement  Sleep:      Musculoskeletal: Strength & Muscle Tone:  Gait & Station:  Patient leans:   Current Medications: Current Outpatient Prescriptions  Medication Sig Dispense Refill  . chlorzoxazone (PARAFON) 500 MG tablet  Take 500 mg by mouth 4 (four) times daily as needed (migraine).      . diazepam (VALIUM) 5 MG tablet 2 bid  120 tablet  2  . HYDROcodone-acetaminophen (NORCO/VICODIN) 5-325 MG per tablet Take 1-2 tablets by mouth every 6 (six) hours as needed for moderate pain or severe pain.      . Multiple Vitamins-Minerals (MULTIVITAMIN WITH MINERALS) tablet Take 1 tablet by mouth every morning.       Marland Kitchen omeprazole (PRILOSEC) 40 MG capsule Take 40 mg by mouth every morning.       Marland Kitchen oxyCODONE-acetaminophen (PERCOCET) 5-325 MG per tablet Take 1 tablet by mouth every 6 (six) hours as needed.  10 tablet  0  . oxyCODONE-acetaminophen (PERCOCET/ROXICET) 5-325 MG per tablet Take 1 tablet by mouth every 6 (six) hours as needed for severe pain.  25 tablet  0  . temazepam (RESTORIL) 15 MG capsule Take 1 capsule (15 mg total) by mouth at bedtime as needed for sleep.  30 capsule  2  . triamterene-hydrochlorothiazide (DYAZIDE) 37.5-25 MG per capsule Take 1 capsule by mouth every morning.      . valACYclovir (VALTREX) 500 MG tablet Take 500 mg by mouth 2 (two) times daily as needed (virus flare).      . vitamin B-12 (CYANOCOBALAMIN) 1000 MCG tablet Take 1,000 mcg by mouth daily.      Marland Kitchen zonisamide (ZONEGRAN) 100 MG capsule Take 400 mg by mouth at bedtime.        No current facility-administered medications for this visit.  Lab Results: No results found for this or any previous visit (from the past 48 hour(s)).  Physical Findings: AIMS:  , ,  ,  ,    CIWA:    COWS:     Treatment Plan Summary: At this time we'll go ahead and increase her Valium from 50 g twice a day to taking 10 mg twice a day. We'll continue the Zoloft as is. The patient continue in one-to-one talking therapy that she does 2 weekly. The patient also began Restoril 50 mg for sleep. Sleep seems to be a big problem with this patient. This patient to return to see me in 6 weeks.  Plan:  Medical Decision Making Problem Points:  Established problem,  worsening (2) Data Points:  Review of medication regiment & side effects (2)  I certify that inpatient services furnished can reasonably be expected to improve the patient's condition.   Stephone Gum IRVING 06/15/2013, 1:20 PM

## 2013-06-16 ENCOUNTER — Ambulatory Visit (HOSPITAL_COMMUNITY): Payer: Self-pay | Admitting: Psychiatry

## 2013-06-20 NOTE — Telephone Encounter (Signed)
Contacted pt.She stated she filled Restoril and paid out of pocket $15.Would like to have Prior Authorization completed before she picks up next refill. 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 States MD told her to go home and rest, and she did.She stated she his currently on a school break.Between sleeping medicine and school break, states she feels better.

## 2013-06-21 ENCOUNTER — Telehealth (HOSPITAL_COMMUNITY): Payer: Self-pay | Admitting: *Deleted

## 2013-06-21 NOTE — Telephone Encounter (Signed)
Error in opening

## 2013-08-04 ENCOUNTER — Ambulatory Visit (HOSPITAL_COMMUNITY): Payer: Self-pay | Admitting: Psychiatry

## 2013-09-13 ENCOUNTER — Ambulatory Visit (HOSPITAL_COMMUNITY): Payer: Self-pay | Admitting: Psychiatry

## 2013-09-14 ENCOUNTER — Other Ambulatory Visit: Payer: Self-pay | Admitting: Internal Medicine

## 2013-10-02 ENCOUNTER — Other Ambulatory Visit: Payer: Self-pay | Admitting: Family Medicine

## 2013-10-02 DIAGNOSIS — M545 Low back pain: Secondary | ICD-10-CM

## 2013-10-03 ENCOUNTER — Encounter (HOSPITAL_COMMUNITY): Payer: Self-pay | Admitting: Emergency Medicine

## 2013-10-03 ENCOUNTER — Emergency Department (HOSPITAL_COMMUNITY)
Admission: EM | Admit: 2013-10-03 | Discharge: 2013-10-03 | Disposition: A | Discharge status: Home / Self Care | Payer: Medicaid Other | Attending: Emergency Medicine | Admitting: Emergency Medicine

## 2013-10-03 DIAGNOSIS — F411 Generalized anxiety disorder: Secondary | ICD-10-CM | POA: Insufficient documentation

## 2013-10-03 DIAGNOSIS — F17200 Nicotine dependence, unspecified, uncomplicated: Secondary | ICD-10-CM | POA: Insufficient documentation

## 2013-10-03 DIAGNOSIS — Z8639 Personal history of other endocrine, nutritional and metabolic disease: Secondary | ICD-10-CM | POA: Insufficient documentation

## 2013-10-03 DIAGNOSIS — F329 Major depressive disorder, single episode, unspecified: Secondary | ICD-10-CM | POA: Insufficient documentation

## 2013-10-03 DIAGNOSIS — Z79899 Other long term (current) drug therapy: Secondary | ICD-10-CM | POA: Insufficient documentation

## 2013-10-03 DIAGNOSIS — Z8619 Personal history of other infectious and parasitic diseases: Secondary | ICD-10-CM | POA: Insufficient documentation

## 2013-10-03 DIAGNOSIS — Z860100 Personal history of colon polyps, unspecified: Secondary | ICD-10-CM | POA: Insufficient documentation

## 2013-10-03 DIAGNOSIS — Z880 Allergy status to penicillin: Secondary | ICD-10-CM | POA: Insufficient documentation

## 2013-10-03 DIAGNOSIS — F3289 Other specified depressive episodes: Secondary | ICD-10-CM | POA: Insufficient documentation

## 2013-10-03 DIAGNOSIS — K219 Gastro-esophageal reflux disease without esophagitis: Secondary | ICD-10-CM | POA: Insufficient documentation

## 2013-10-03 DIAGNOSIS — Z9889 Other specified postprocedural states: Secondary | ICD-10-CM | POA: Insufficient documentation

## 2013-10-03 DIAGNOSIS — I10 Essential (primary) hypertension: Secondary | ICD-10-CM | POA: Insufficient documentation

## 2013-10-03 DIAGNOSIS — M549 Dorsalgia, unspecified: Secondary | ICD-10-CM | POA: Insufficient documentation

## 2013-10-03 DIAGNOSIS — G43909 Migraine, unspecified, not intractable, without status migrainosus: Secondary | ICD-10-CM | POA: Insufficient documentation

## 2013-10-03 DIAGNOSIS — G8929 Other chronic pain: Secondary | ICD-10-CM | POA: Insufficient documentation

## 2013-10-03 DIAGNOSIS — Z862 Personal history of diseases of the blood and blood-forming organs and certain disorders involving the immune mechanism: Secondary | ICD-10-CM | POA: Insufficient documentation

## 2013-10-03 MED ORDER — KETOROLAC TROMETHAMINE 60 MG/2ML IM SOLN
60.0000 mg | Freq: Once | INTRAMUSCULAR | Status: AC
  Administered 2013-10-03: 60 mg via INTRAMUSCULAR
  Filled 2013-10-03: qty 2

## 2013-10-03 MED ORDER — TRAMADOL HCL 50 MG PO TABS: 50.0000 mg | ORAL_TABLET | Freq: Four times a day (QID) | ORAL | Status: DC | PRN

## 2013-10-03 NOTE — ED Provider Notes (Signed)
CSN: [id number]     Arrival date & time 10/03/13  1305 History  This chart was scribed for  Michele Mcalpine PA-C, working with Mirna Mires, MD by Stacy Gardner, ED scribe. This patient was seen in room [place] and the patient's care was started at 1:20 PM.   First MD Initiated Contact with Patient 10/03/13 1306     Chief Complaint  Patient presents with  . Back Pain    recurrent low back pain  . Arm Swelling    swelling and pain in r/arm     (Consider location/radiation/quality/duration/timing/severity/associated sxs/prior Treatment) The history is provided by the patient and medical records. No language interpreter was used.   HPI Comments: Gloria Lewis is a 58 y.o. female with hx of osteoarthritis and chronic back pain who presents to the Emergency Department complaining of an exacerbation of constant, moderate arm and lower back pain. Denies new injury or trauma. Pt is unable to sleep or exercise due to pain. She has tried hot/cold compression, Tiger Balm, Motrin 600 mg and OTC pain medication but those do not seem to help. Pt has a hx of chronic lower back pain but she mentions this pain is worsening She went to [place] a few days ago. Pt is scheduled to have MRI on July 10 but "cannot wait until then". Pt usually goes to the ED when the pain is worse and has Percocet or Hydrocodone.  States she is unable to take Tylenol but has no problem with percocet. Pt also mentions the Motrin made her have excessive BM. Denies pain, numbness or tingling down her extremities. No loss of control of bowels or bladder or saddle anesthesia. Denies fever or chills.     Past Medical History  Diagnosis Date  . HTN (hypertension)   . Bipolar 1 disorder   . OA (osteoarthritis)   . GERD (gastroesophageal reflux disease)   . Hypercholesterolemia   . Fever blister   . HA (headache)   . Colon polyp   . CTS (carpal tunnel syndrome)   . Schizo-affective psychosis   . Anxiety   .  Depression   . Migraines    Past Surgical History  Procedure Laterality Date  . Neck surgery  2009  . Carpal tunnel release  20110 rt/lt  . Polp removed  2011  . Back injection    . 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 Anterior cervical decomp/discectomy fusion  08/27/2011    Procedure: ANTERIOR CERVICAL DECOMPRESSION/DISCECTOMY FUSION 1 LEVEL/HARDWARE REMOVAL;  Surgeon: Eustace Moore, MD;  Location: [facility];  Service: Neurosurgery;  Laterality: Bilateral;  Cervical four-five Anterior cervical decompression/diskectomy, fusion, Plate, Removal of Cervical five-seven Plate  . Hemorrhoid surgery     Family History  Problem Relation Age of Onset  . Coronary artery disease Father   . Hypertension Mother   . Diabetes Mother   . Schizophrenia Mother   . Depression Brother   . Prostate cancer Brother   . Anesthesia problems Neg Hx   . Hypotension Neg Hx   . Malignant hyperthermia Neg Hx   . Pseudochol deficiency Neg Hx    History  Substance Use Topics  . Smoking status: Current Some Day Smoker -- 0.25 packs/day for 30 years    Types: Cigarettes  . Smokeless tobacco: Never Used  . Alcohol Use: Yes   OB History   Grav Para Term Preterm Abortions TAB SAB Ect Mult Living  Review of Systems  Musculoskeletal: Positive for arthralgias, back pain and myalgias.      Allergies  Amoxicillin; Chantix; Effexor; Norco; Paroxetine hcl; Penicillins; Zithromax; and Hydrocodone  Home Medications   Prior to Admission medications   Medication Sig Start Date End Date Taking? Authorizing Provider  chlorzoxazone (PARAFON) 500 MG tablet Take 500 mg by mouth 4 (four) times daily as needed (migraine).    Historical Provider, MD  diazepam (VALIUM) 5 MG tablet 2 bid 06/15/13   Norma Fredrickson, MD  HYDROcodone-acetaminophen (NORCO/VICODIN) 5-325 MG per tablet Take 1-2 tablets by mouth every 6 (six) hours as needed for moderate pain or severe  pain.    Historical Provider, MD  Multiple Vitamins-Minerals (MULTIVITAMIN WITH MINERALS) tablet Take 1 tablet by mouth every morning.     Historical Provider, MD  omeprazole (PRILOSEC) 40 MG capsule Take 40 mg by mouth every morning.     Historical Provider, MD  oxyCODONE-acetaminophen (PERCOCET) 5-325 MG per tablet Take 1 tablet by mouth every 6 (six) hours as needed. 04/11/13   Neta Ehlers, MD  oxyCODONE-acetaminophen (PERCOCET/ROXICET) 5-325 MG per tablet Take 1 tablet by mouth every 6 (six) hours as needed for severe pain. 03/06/13   Tiffany Marilu Favre, PA-C  temazepam (RESTORIL) 15 MG capsule Take 1 capsule (15 mg total) by mouth at bedtime as needed for sleep. 06/15/13   Norma Fredrickson, MD  traMADol (ULTRAM) 50 MG tablet Take 1 tablet (50 mg total) by mouth every 6 (six) hours as needed. 10/03/13   Illene Labrador, PA-C  triamterene-hydrochlorothiazide (DYAZIDE) 37.5-25 MG per capsule Take 1 capsule by mouth every morning.    Historical Provider, MD  valACYclovir (VALTREX) 500 MG tablet Take 500 mg by mouth 2 (two) times daily as needed (virus flare).    Historical Provider, MD  vitamin B-12 (CYANOCOBALAMIN) 1000 MCG tablet Take 1,000 mcg by mouth daily.    Historical Provider, MD  zonisamide (ZONEGRAN) 100 MG capsule Take 400 mg by mouth at bedtime.     Historical Provider, MD   BP 126/85  Pulse 69  Temp(Src) 98.6 F (37 C) (Oral)  Resp 18  SpO2 98% Physical Exam  Nursing note and vitals reviewed. Constitutional: She is oriented to person, place, and time. She appears well-developed and well-nourished. No distress.  HENT:  Head: Normocephalic and atraumatic.  Mouth/Throat: Oropharynx is clear and moist.  Eyes: Conjunctivae and EOM are normal.  Neck: Normal range of motion. Neck supple. No spinous process tenderness and no muscular tenderness present.  Cardiovascular: Normal rate, regular rhythm, normal heart sounds and intact distal pulses.   Pulmonary/Chest: Effort normal and breath  sounds normal. No respiratory distress.  Musculoskeletal: Normal range of motion. She exhibits tenderness. She exhibits no edema.  Generalized tenderness to palpation throughout lower back.  generalized tenderness to palpation to right arm without edema, erythema, ecchymosis or deformity. Full ROM without pian.  Neurological: She is alert and oriented to person, place, and time. She has normal strength. No sensory deficit.  Strength lower extremities 5/5 and equal bilateral. Sensation intact. Normal gait.  Skin: Skin is warm and dry. No rash noted. She is not diaphoretic.  Psychiatric: She has a normal mood and affect. Her behavior is normal.    ED Course  Procedures (including critical care time) DIAGNOSTIC STUDIES: Oxygen Saturation is 98% on RA, normal by my interpretation.    COORDINATION OF CARE:  1:26 PM Discussed course of care with pt . Pt understands and agrees.  Labs Review Labs Reviewed - No data to display  Imaging Review No results found.   EKG Interpretation None      MDM   Final diagnoses:  Chronic back pain  Chronic pain   Pt presenting with exacerbation of chronic pain. Well appearing, NAD. Afebrile, VSS. No injury or trauma. Requesting pain medication until her MRI on July 10. No red flags concerning patient's back pain. No s/s of central cord compression or cauda equina. Lower extremities are neurovascularly intact and patient is ambulating without difficulty. Will give rx for tramadol. Pt states allergic to tylenol but can tolerate percocet. I do not feel this is appropriate treatment at this time. F/u with spine specialist. Stable for d/c. Return precautions given. Patient states understanding of treatment care plan and is agreeable.  I personally performed the services described in this documentation, which was scribed in my presence. The recorded information has been reviewed and is accurate.    Illene Labrador, PA-C 10/03/13 1348

## 2013-10-03 NOTE — ED Notes (Addendum)
Pt reports chronic low back pain due to injured back. Increased pain this am, and r/arm pain and swelling. Swelling decreased at present

## 2013-10-03 NOTE — Discharge Instructions (Signed)
Take tramadol as directed as needed for pain. Followup with your back specialist.  Chronic Back Pain  When back pain lasts longer than 3 months, it is called chronic back pain.People with chronic back pain often go through certain periods that are more intense (flare-ups).  CAUSES Chronic back pain can be caused by wear and tear (degeneration) on different structures in your back. These structures include:  The bones of your spine (vertebrae) and the joints surrounding your spinal cord and nerve roots (facets).  The strong, fibrous tissues that connect your vertebrae (ligaments). Degeneration of these structures may result in pressure on your nerves. This can lead to constant pain. HOME CARE INSTRUCTIONS  Avoid bending, heavy lifting, prolonged sitting, and activities which make the problem worse.  Take brief periods of rest throughout the day to reduce your pain. Lying down or standing usually is better than sitting while you are resting.  Take over-the-counter or prescription medicines only as directed by your caregiver. SEEK IMMEDIATE MEDICAL CARE IF:   You have weakness or numbness in one of your legs or feet.  You have trouble controlling your bladder or bowels.  You have nausea, vomiting, abdominal pain, shortness of breath, or fainting. Document Released: 04/30/2004 Document Revised: 06/15/2011 Document Reviewed: 03/07/2011 [facility] Patient Information 2015 [place], [place]. This information is not intended to replace advice given to you by your health care provider. Make sure you discuss any questions you have with your health care provider.  Chronic Pain Chronic pain can be defined as pain that is off and on and lasts for 3-6 months or longer. Many things cause chronic pain, which can make it difficult to make a diagnosis. There are many treatment options available for chronic pain. However, finding a treatment that works well for you may require trying various approaches until  the right one is found. Many people benefit from a combination of two or more types of treatment to control their pain. SYMPTOMS  Chronic pain can occur anywhere in the body and can range from mild to very severe. Some types of chronic pain include:  Headache.  Low back pain.  Cancer pain.  Arthritis pain.  Neurogenic pain. This is pain resulting from damage to nerves. People with chronic pain may also have other symptoms such as:  Depression.  Anger.  Insomnia.  Anxiety. DIAGNOSIS  Your health care provider will help diagnose your condition over time. In many cases, the initial focus will be on excluding possible conditions that could be causing the pain. Depending on your symptoms, your health care provider may order tests to diagnose your condition. Some of these tests may include:   Blood tests.   CT scan.   MRI.   X-rays.   Ultrasounds.   Nerve conduction studies.  You may need to see a specialist.  TREATMENT  Finding treatment that works well may take time. You may be referred to a pain specialist. He or she may prescribe medicine or therapies, such as:   Mindful meditation or yoga.  Shots (injections) of numbing or pain-relieving medicines into the spine or area of pain.  Local electrical stimulation.  Acupuncture.   Massage therapy.   Aroma, color, light, or sound therapy.   Biofeedback.   Working with a physical therapist to keep from getting stiff.   Regular, gentle exercise.   Cognitive or behavioral therapy.   Group support.  Sometimes, surgery may be recommended.  HOME CARE INSTRUCTIONS   Take all medicines as directed by your  health care provider.   Lessen stress in your life by relaxing and doing things such as listening to calming music.   Exercise or be active as directed by your health care provider.   Eat a healthy diet and include things such as vegetables, fruits, fish, and lean meats in your diet.   Keep  all follow-up appointments with your health care provider.   Attend a support group with others suffering from chronic pain. SEEK MEDICAL CARE IF:   Your pain gets worse.   You develop a new pain that was not there before.   You cannot tolerate medicines given to you by your health care provider.   You have new symptoms since your last visit with your health care provider.  SEEK IMMEDIATE MEDICAL CARE IF:   You feel weak.   You have decreased sensation or numbness.   You lose control of bowel or bladder function.   Your pain suddenly gets much worse.   You develop shaking.  You develop chills.  You develop confusion.  You develop chest pain.  You develop shortness of breath.  MAKE SURE YOU:  Understand these instructions.  Will watch your condition.  Will get help right away if you are not doing well or get worse. Document Released: 12/13/2001 Document Revised: 11/23/2012 Document Reviewed: 09/16/2012 [facility] Patient Information 2015 [place], [place]. This information is not intended to replace advice given to you by your health care provider. Make sure you discuss any questions you have with your health care provider.

## 2013-10-05 NOTE — ED Provider Notes (Signed)
Medical screening examination/treatment/procedure(s) were performed by non-physician practitioner and as supervising physician I was immediately available for consultation/collaboration.     Mirna Mires, MD 10/05/13 [phone]

## 2013-10-13 ENCOUNTER — Other Ambulatory Visit: Payer: Self-pay | Admitting: Internal Medicine

## 2013-10-14 ENCOUNTER — Ambulatory Visit
Admission: RE | Admit: 2013-10-14 | Discharge: 2013-10-14 | Disposition: A | Discharge status: Home / Self Care | Payer: Medicaid Other | Source: Ambulatory Visit | Attending: Family Medicine | Admitting: Family Medicine

## 2013-10-14 DIAGNOSIS — M545 Low back pain: Secondary | ICD-10-CM

## 2013-10-27 ENCOUNTER — Other Ambulatory Visit (HOSPITAL_COMMUNITY): Payer: Self-pay | Admitting: Family Medicine

## 2013-10-27 DIAGNOSIS — Z1231 Encounter for screening mammogram for malignant neoplasm of breast: Secondary | ICD-10-CM

## 2013-10-30 ENCOUNTER — Other Ambulatory Visit (HOSPITAL_COMMUNITY): Payer: Self-pay | Admitting: *Deleted

## 2013-10-30 MED ORDER — TEMAZEPAM 15 MG PO CAPS: 15.0000 mg | ORAL_CAPSULE | Freq: Every evening | ORAL | Status: DC | PRN

## 2013-10-30 MED ORDER — DIAZEPAM 5 MG PO TABS: ORAL_TABLET | ORAL | Status: DC

## 2013-11-01 ENCOUNTER — Ambulatory Visit (INDEPENDENT_AMBULATORY_CARE_PROVIDER_SITE_OTHER): Payer: Federal, State, Local not specified - Other | Admitting: Psychiatry

## 2013-11-01 VITALS — BP 128/81 | HR 72 | Wt 203.0 lb

## 2013-11-01 DIAGNOSIS — F332 Major depressive disorder, recurrent severe without psychotic features: Secondary | ICD-10-CM

## 2013-11-01 MED ORDER — DIAZEPAM 5 MG PO TABS: ORAL_TABLET | ORAL | Status: DC

## 2013-11-01 MED ORDER — TEMAZEPAM 15 MG PO CAPS: 15.0000 mg | ORAL_CAPSULE | Freq: Every evening | ORAL | Status: DC | PRN

## 2013-11-01 MED ORDER — BUPROPION HCL ER (XL) 150 MG PO TB24: ORAL_TABLET | ORAL | Status: DC

## 2013-11-01 NOTE — Progress Notes (Signed)
[facility] MD Progress Note  11/01/2013 3:35 PM TALINE NASS  MRN:  [id number] Subjective:  Doing fairly well The patient is actually doing very well in school. She is getting A's and B's and [employer]. 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 Unfortunately patient found out that she has significant fractures of her back and likely will have back surgery in the next few months. Patient also has been dealing a lot with her son who is schizophrenic and has been unstable variety of ways. He had a legal issue that she's been dealing with. The patient says that she feels depressed in response to things. She does not feel persistently depressed in an overwhelming way. She certainly is not paralyzed by her mood state. We did find out unfortunately that her orthopedic surgeon has recently begun her on tramadol which she says is very helpful. Unfortunately I shared with her that is potentially interact with Zoloft. She fact acknowledges that she's been having intense headaches ever since starting tramadol. The patient is eating well. She is concentrating well and dictated by her good grades. She is a good sense of worth. She enjoys reading watching TV. The patient's anxiety is well-controlled on Valium 10 mg twice a day. The patient is not suicidal. She denies the use of alcohol or drugs. She shows no evidence of psychosis at all. She's very committed to her son. She is also committed to finishing school. Over the last 2 years she's gone back to driving again. She was paralyzed with anxiety in the past but that is gone. The interventions of Zoloft and Valium and a sleeping aid been very beneficial for this individual. Nonetheless I believe we are obligated to switch out Zoloft and try her on Wellbutrin as this is a safer agent to take if she's going to continue on tramadol. Her scheduled surgery will occur in the next 2-3 months. Diagnosis:   DSM5: Schizophrenia Disorders:    Obsessive-Compulsive Disorders:   Trauma-Stressor Disorders:   Substance/Addictive Disorders:   Depressive Disorders:  Major Depressive Disorder - Mild (296.21) Total Time spent with patient: 30 minutes  Axis I: Major Depression, Recurrent severe  ADL's:  Intact  Sleep: Good  Appetite:  Good  Suicidal Ideation:  no Homicidal Ideation:  none AEB (as evidenced by):  Psychiatric Specialty Exam: Physical Exam  ROS  Blood pressure 128/81, pulse 72, weight 203 lb (92.08 kg).Body mass index is 31.56 kg/(m^2).  General Appearance: Casual  Eye Contact::  Good  Speech:  Clear and Coherent  Volume:  Normal  Mood:  NA  Affect:  Appropriate  Thought Process:  Coherent  Orientation:  Full (Time, Place, and Person)  Thought Content:  WDL  Suicidal Thoughts:  No  Homicidal Thoughts:  No  Memory:  NA  Judgement:  Good  Insight:  Good  Psychomotor Activity:  Normal  Concentration:  Good  Recall:  Good  Fund of Knowledge:Good  Language: Good  Akathisia:  No  Handed:  Right  AIMS (if indicated):     Assets:  Communication Skills  Sleep:      Musculoskeletal: Strength & Muscle Tone:  Gait & Station:  Patient leans:   Current Medications: Current Outpatient Prescriptions  Medication Sig Dispense Refill  . buPROPion (WELLBUTRIN XL) 150 MG 24 hr tablet 2 qam  60 tablet  5  . chlorzoxazone (PARAFON) 500 MG tablet Take 500 mg by mouth 4 (four) times daily as needed (migraine).      Marland Kitchen  diazepam (VALIUM) 5 MG tablet Take 2 tablets bid  120 tablet  4  . HYDROcodone-acetaminophen (NORCO/VICODIN) 5-325 MG per tablet Take 1-2 tablets by mouth every 6 (six) hours as needed for moderate pain or severe pain.      . Multiple Vitamins-Minerals (MULTIVITAMIN WITH MINERALS) tablet Take 1 tablet by mouth every morning.       Marland Kitchen omeprazole (PRILOSEC) 40 MG capsule Take 40 mg by mouth every morning.       Marland Kitchen oxyCODONE-acetaminophen (PERCOCET) 5-325 MG per tablet Take 1 tablet by mouth every 6  (six) hours as needed.  10 tablet  0  . oxyCODONE-acetaminophen (PERCOCET/ROXICET) 5-325 MG per tablet Take 1 tablet by mouth every 6 (six) hours as needed for severe pain.  25 tablet  0  . temazepam (RESTORIL) 15 MG capsule Take 1 capsule (15 mg total) by mouth at bedtime as needed for sleep.  30 capsule  5  . traMADol (ULTRAM) 50 MG tablet Take 1 tablet (50 mg total) by mouth every 6 (six) hours as needed.  10 tablet  0  . triamterene-hydrochlorothiazide (DYAZIDE) 37.5-25 MG per capsule Take 1 capsule by mouth every morning.      . valACYclovir (VALTREX) 500 MG tablet Take 500 mg by mouth 2 (two) times daily as needed (virus flare).      . vitamin B-12 (CYANOCOBALAMIN) 1000 MCG tablet Take 1,000 mcg by mouth daily.      Marland Kitchen zonisamide (ZONEGRAN) 100 MG capsule Take 400 mg by mouth at bedtime.        No current facility-administered medications for this visit.    Lab Results: No results found for this or any previous visit (from the past 48 hour(s)).  Physical Findings: AIMS:  , ,  ,  ,    CIWA:    COWS:     Treatment Plan Summary: At this time this patient she'll come off Zoloft by taking 50 mg a day for 4 days and stop it at the same time she she'll begin on Wellbutrin. She'll titrate up to 300 mg in the morning. The patient has never had a seizure. I believe [place] will be good for her and that'll help her energy or concentration. I think also it'll be a different approach to dealing with her depression. I think her depression is mild at this time and I think it is reactive. The patient will continue taking Valium 10 mg twice a day and Restoril for sleep. She does takes 50 mg of Restoril and its amazingly wonderful response getting a good night of sleep. This patient to return to see me in approximately 7 weeks. Hopefully L. see her just before she has her back surgery.  Plan:  Medical Decision Making Problem Points:  Established problem, worsening (2) Data Points:  Review of new  medications or change in dosage (2)  I certify that inpatient services furnished can reasonably be expected to improve the patient's condition.   Arrian Manson, [place] 11/01/2013, 3:35 PM

## 2013-11-03 ENCOUNTER — Ambulatory Visit (HOSPITAL_COMMUNITY)
Admission: RE | Admit: 2013-11-03 | Discharge: 2013-11-03 | Disposition: A | Discharge status: Home / Self Care | Payer: Medicaid Other | Source: Ambulatory Visit | Attending: Family Medicine | Admitting: Family Medicine

## 2013-11-03 DIAGNOSIS — Z1231 Encounter for screening mammogram for malignant neoplasm of breast: Secondary | ICD-10-CM | POA: Diagnosis not present

## 2013-11-15 ENCOUNTER — Ambulatory Visit (HOSPITAL_COMMUNITY): Payer: Self-pay | Admitting: Psychiatry

## 2014-01-02 ENCOUNTER — Telehealth (HOSPITAL_COMMUNITY): Payer: Self-pay

## 2014-01-03 ENCOUNTER — Ambulatory Visit (HOSPITAL_COMMUNITY): Payer: Self-pay | Admitting: Psychiatry

## 2014-01-03 MED ORDER — LEVOMILNACIPRAN HCL ER 20 MG PO CP24: 20.0000 mg | ORAL_CAPSULE | Freq: Every morning | ORAL | Status: DC

## 2014-01-03 NOTE — Telephone Encounter (Signed)
tell the patient to discontinue her Wellbutrin and begin on the new medication Fetzima that seem a 20 mg one every morning.

## 2014-01-08 ENCOUNTER — Encounter: Payer: Self-pay | Admitting: Neurology

## 2014-01-08 ENCOUNTER — Ambulatory Visit (INDEPENDENT_AMBULATORY_CARE_PROVIDER_SITE_OTHER): Payer: Medicaid Other | Admitting: Neurology

## 2014-01-08 VITALS — BP 120/77 | HR 70 | Temp 98.6°F | Resp 18 | Ht 67.0 in | Wt 193.7 lb

## 2014-01-08 DIAGNOSIS — G43709 Chronic migraine without aura, not intractable, without status migrainosus: Secondary | ICD-10-CM

## 2014-01-08 DIAGNOSIS — M542 Cervicalgia: Secondary | ICD-10-CM

## 2014-01-08 MED ORDER — PROPRANOLOL HCL 20 MG PO TABS: 20.0000 mg | ORAL_TABLET | Freq: Two times a day (BID) | ORAL | Status: DC

## 2014-01-08 MED ORDER — CYCLOBENZAPRINE HCL 10 MG PO TABS: 10.0000 mg | ORAL_TABLET | Freq: Three times a day (TID) | ORAL | Status: DC | PRN

## 2014-01-08 NOTE — Patient Instructions (Signed)
1.  I increased propranolol to 20mg  twice daily.  Call in 4 weeks with update and we can adjust dose as needed. 2.  Cyclobenzaprine 10mg  up to three times daily as needed for neck pian 3.  Tramadol as needed for headache.  Limit use of all pain relievers to no more than 2 days out of the week. 4.  Keep headache diary 5.  Follow up in 3 months.

## 2014-01-08 NOTE — Progress Notes (Addendum)
NEUROLOGY CONSULTATION NOTE  Gloria Lewis MRN: [id number] DOB: [birth year]  Referring provider: Dr. Dema Severin  Primary care provider: Dr. Dema Severin  Reason for consult:  headache  HISTORY OF PRESENT ILLNESS: Gloria Lewis is a 58 year old right-handed woman with history of hypertension, prior drug and alcohol abuse, major depressive disorder, bipolar disorder, schizoaffective disorder, migraines, tension headache, cervical disc disease status post 2 surgeries, lumbar degenerative disc disease, osteoarthritis, prolapsed bladder, hypercholesterolemia, IBS, and GERD who presents for headaches.  Records reviewed.  Onset:  Since childhood, but worse since 2009 Location:  Bi-temporal and at base of her skull, neck pain sometimes radiating down into right shoulder Quality:  Constant severe aching (other daily headaches are throbbing). Intensity:  8/10 Aura:  no Prodrome:  no Associated symptoms:  Nausea, photophobia, phonophobia, osmophobia, blurred vision Duration:  1 hour if lays down to rest (otherwise all day) Frequency:  3 times a week (but has daily headaches) Triggers/exacerbating factors:  Stress, sleeping on right side (where she had cervical spine surgery), movement, neck pain Relieving factors:  Laying down to rest in dark room Activity:  Cannot function  Past abortive therapy:  Advil, Aleve, Tylenol, cyclobenzaprine (helped), chlorpromazine HCL 25-50mg  Past preventative therapy:  Trigger point injections (helped briefly), topamax (effective but told to get off for unknown reason), Zoloft (used for depression, made headache worse), PT of the neck, Accupuncture, zonisamide 400mg   Current abortive therapy:  Tramadol 50mg  (uses once a week) Current preventative therapy:  propranolol 10mg  twice daily (just started a month ago for HTN) Other current medications:  bupropion, G92, MVI, folic acid, omeprazole  Caffeine:  no Alcohol:  no Smoker:  no Diet:  good Exercise:   swims Depression/stress:  Increased stress this year due to mental health of her son, which has caused increase in headache Sleep hygiene:  Falls asleep quickly but always wakes up at 4am, but feels refreshed  PAST MEDICAL HISTORY: Past Medical History  Diagnosis Date  . HTN (hypertension)   . Bipolar 1 disorder   . OA (osteoarthritis)   . GERD (gastroesophageal reflux disease)   . Hypercholesterolemia   . Fever blister   . HA (headache)   . Colon polyp   . CTS (carpal tunnel syndrome)   . Schizo-affective psychosis   . Anxiety   . Depression   . Migraines     PAST SURGICAL HISTORY: Past Surgical History  Procedure Laterality Date  . Neck surgery  2009  . Carpal tunnel release  20110 rt/lt  . Polp removed  2011  . Back injection    . 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 Anterior cervical decomp/discectomy fusion  08/27/2011    Procedure: ANTERIOR CERVICAL DECOMPRESSION/DISCECTOMY FUSION 1 LEVEL/HARDWARE REMOVAL;  Surgeon: Eustace Moore, MD;  Location: [facility];  Service: Neurosurgery;  Laterality: Bilateral;  Cervical four-five Anterior cervical decompression/diskectomy, fusion, Plate, Removal of Cervical five-seven Plate  . Hemorrhoid surgery      MEDICATIONS: Current Outpatient Prescriptions on File Prior to Visit  Medication Sig Dispense Refill  . buPROPion (WELLBUTRIN XL) 150 MG 24 hr tablet 2 qam  60 tablet  5  . chlorzoxazone (PARAFON) 500 MG tablet Take 500 mg by mouth 4 (four) times daily as needed (migraine).      . diazepam (VALIUM) 5 MG tablet Take 2 tablets bid  120 tablet  4  . Levomilnacipran HCl ER (FETZIMA) 20 MG CP24 Take 20 mg by  mouth every morning.  30 capsule  5  . Multiple Vitamins-Minerals (MULTIVITAMIN WITH MINERALS) tablet Take 1 tablet by mouth every morning.       Marland Kitchen omeprazole (PRILOSEC) 40 MG capsule Take 40 mg by mouth every morning.       Marland Kitchen oxyCODONE-acetaminophen (PERCOCET) 5-325 MG per tablet Take 1  tablet by mouth every 6 (six) hours as needed.  10 tablet  0  . traMADol (ULTRAM) 50 MG tablet Take 1 tablet (50 mg total) by mouth every 6 (six) hours as needed.  10 tablet  0  . triamterene-hydrochlorothiazide (DYAZIDE) 37.5-25 MG per capsule Take 1 capsule by mouth every morning.      . valACYclovir (VALTREX) 500 MG tablet Take 500 mg by mouth 2 (two) times daily as needed (virus flare).      . vitamin B-12 (CYANOCOBALAMIN) 1000 MCG tablet Take 1,000 mcg by mouth daily.      Marland Kitchen zonisamide (ZONEGRAN) 100 MG capsule Take 400 mg by mouth at bedtime.       Marland Kitchen HYDROcodone-acetaminophen (NORCO/VICODIN) 5-325 MG per tablet Take 1-2 tablets by mouth every 6 (six) hours as needed for moderate pain or severe pain.      Marland Kitchen oxyCODONE-acetaminophen (PERCOCET/ROXICET) 5-325 MG per tablet Take 1 tablet by mouth every 6 (six) hours as needed for severe pain.  25 tablet  0  . temazepam (RESTORIL) 15 MG capsule Take 1 capsule (15 mg total) by mouth at bedtime as needed for sleep.  30 capsule  5   No current facility-administered medications on file prior to visit.    ALLERGIES: Allergies  Allergen Reactions  . Amoxicillin Itching  . Chantix [Varenicline Tartrate] Nausea Only  . Effexor [Venlafaxine Hydrochloride] Itching and Other (See Comments)    headache  . Norco [Hydrocodone-Acetaminophen] Itching  . Paroxetine Hcl Other (See Comments)    headache  . Penicillins Hives  . Zithromax [Azithromycin Dihydrate] Swelling  . Hydrocodone Other (See Comments)    headache    FAMILY HISTORY: Family History  Problem Relation Age of Onset  . Coronary artery disease Father   . Hypertension Mother   . Schizophrenia Mother   . Depression Brother   . Prostate cancer Brother   . Anesthesia problems Neg Hx   . Hypotension Neg Hx   . Malignant hyperthermia Neg Hx   . Pseudochol deficiency Neg Hx   . Cancer Father     head neck     SOCIAL HISTORY: History   Social History  . Marital Status: Single     Spouse Name: N/A    Number of Children: N/A  . Years of Education: N/A   Occupational History  . Not on file.   Social History Main Topics  . Smoking status: Current Some Day Smoker -- 0.25 packs/day for 30 years    Types: Cigarettes  . Smokeless tobacco: Never Used  . Alcohol Use: Yes  . Drug Use: No  . Sexual Activity: No   Other Topics Concern  . Not on file   Social History Narrative  . No narrative on file    REVIEW OF SYSTEMS: Constitutional: No fevers, chills, or sweats, no generalized fatigue, change in appetite Eyes: No visual changes, double vision, eye pain Ear, nose and throat: No hearing loss, ear pain, nasal congestion, sore throat Cardiovascular: No chest pain, palpitations Respiratory:  No shortness of breath at rest or with exertion, wheezes GastrointestinaI: No nausea, vomiting, diarrhea, abdominal pain, fecal incontinence Genitourinary:  No dysuria, urinary  retention or frequency Musculoskeletal:  Neck and back pain Integumentary: No rash, pruritus, skin lesions Neurological: as above Psychiatric: No depression, insomnia, anxiety Endocrine: No palpitations, fatigue, diaphoresis, mood swings, change in appetite, change in weight, increased thirst Hematologic/Lymphatic:  No anemia, purpura, petechiae. Allergic/Immunologic: no itchy/runny eyes, nasal congestion, recent allergic reactions, rashes  PHYSICAL EXAM: Filed Vitals:   01/08/14 0828  BP: 120/77  Pulse: 70  Temp: 98.6 F (37 C)  Resp: 18   General: No acute distress Head:  Normocephalic/atraumatic Neck: supple, no paraspinal tenderness, full range of motion Back: No paraspinal tenderness Heart: regular rate and rhythm Lungs: Clear to auscultation bilaterally. Vascular: No carotid bruits. Neurological Exam: Mental status: alert and oriented to person, place, and time, recent and remote memory intact, fund of knowledge intact, attention and concentration intact, speech fluent and not  dysarthric, language intact. Cranial nerves: CN I: not tested CN II: pupils equal, round and reactive to light, visual fields intact, fundi unremarkable, without vessel changes, exudates, hemorrhages or papilledema. CN III, IV, VI:  full range of motion, no nystagmus, no ptosis CN V: facial sensation intact CN VII: upper and lower face symmetric CN VIII: hearing intact CN IX, X: gag intact, uvula midline CN XI: sternocleidomastoid and trapezius muscles intact CN XII: tongue midline Bulk & Tone: normal, no fasciculations. Motor: 5/5 throughout Sensation: temperature and vibration intact Deep Tendon Reflexes: 2+ and symmetric in upper extremities, absent in lower extremities, toes downgoing3 Finger to nose testing: no dysmetria Gait: normal station and stride.  Able to turn and walk in tandem. Romberg negative.  IMPRESSION: Chronic migraine without aura, cervicogenic Neck pain  PLAN: 1.  Will increase propranolol to 20mg  twice daily 2.  Cylcobenzaprine 10mg  for neck pain 3.  Headache diary 4.  Follow up in 3 months. 5.  Smoking cessation  Thank you for allowing me to take part in the care of this patient.  Metta Clines, DO  CC:  Harlan Stains, MD

## 2014-01-12 ENCOUNTER — Ambulatory Visit (INDEPENDENT_AMBULATORY_CARE_PROVIDER_SITE_OTHER): Payer: Federal, State, Local not specified - Other | Admitting: Psychiatry

## 2014-01-12 ENCOUNTER — Ambulatory Visit (HOSPITAL_COMMUNITY): Payer: Self-pay | Admitting: Psychiatry

## 2014-01-12 VITALS — BP 136/89 | HR 75 | Ht 67.25 in | Wt 194.4 lb

## 2014-01-12 DIAGNOSIS — F332 Major depressive disorder, recurrent severe without psychotic features: Secondary | ICD-10-CM

## 2014-01-12 MED ORDER — TEMAZEPAM 15 MG PO CAPS: 15.0000 mg | ORAL_CAPSULE | Freq: Every evening | ORAL | Status: DC | PRN

## 2014-01-12 MED ORDER — DIAZEPAM 5 MG PO TABS: ORAL_TABLET | ORAL | Status: DC

## 2014-01-12 MED ORDER — LEVOMILNACIPRAN HCL ER 20 MG PO CP24: 40.0000 mg | ORAL_CAPSULE | Freq: Every morning | ORAL | Status: DC

## 2014-01-12 NOTE — Progress Notes (Signed)
[facility] MD Progress Note  01/12/2014 12:03 PM Gloria Lewis  MRN:  [id number] Subjective: Minor improvement Today the patient denies being depressed regularly. She's having some complications by getting back surgery. She's changing doctors. The patient continues in school. She's a test coming up today. The patient is a 58 year old who is living with her who recently is dealing with the death of his hand. Patient has another child who is older who has chronic severe mental illness. He unfortunately is on the streets. The patient attempted to get guardianship but the cords felt to give it to her. The patient therefore son is very sick. The patient is dealing with the dispense she can. She continues to try to function. The patient claims that she sleeping very well taking Restoril. Her appetite is actually very reduced. The patient could not tolerate the Wellbutrin because it produced a headache and she was changed instead to take low-dose Fetzima 20 mg. The patient's been on this agent for 2 weeks. She's had no problems with that that is no headaches and no other complaints. The patient is yet to feel any difference from it. This patient is not suicidal. The patient has been clean of drugs and alcohol for many many years. Patient continues to function fairly well. Her other complaint is a problem with concentration and she's hoping that the use of the antidepressant will help this as well. The patient takes her Valium a regular basis and seems very compliant. Diagnosis:   DSM5: Schizophrenia Disorders:   Obsessive-Compulsive Disorders:   Trauma-Stressor Disorders:   Substance/Addictive Disorders:   Depressive Disorders:  Major Depressive Disorder - Mild (296.21) Total Time spent with patient: 15 minutes  Axis I: Major Depression, Recurrent severe  ADL's:  Intact  Sleep: Good  Appetite:  Fair  Suicidal Ideation:  no Homicidal Ideation:  none AEB (as evidenced by):  Psychiatric Specialty  Exam: Physical Exam  ROS  Blood pressure 136/89, pulse 75, height 5' 7.25" (1.708 m), weight 194 lb 6.4 oz (88.179 kg).Body mass index is 30.23 kg/(m^2).  General Appearance: Casual  Eye Contact::  Fair  Speech:  NA  Volume:  Normal  Mood:  Euthymic  Affect:  Appropriate  Thought Process:  Coherent  Orientation:  Full (Time, Place, and Person)  Thought Content:  WDL  Suicidal Thoughts:  No  Homicidal Thoughts:  No  Memory:  NA  Judgement:  Good  Insight:  Good  Psychomotor Activity:  Normal  Concentration:  Good  Recall:  Good  Fund of Knowledge:Good  Language: Good  Akathisia:  No  Handed:  Right  AIMS (if indicated):     Assets:  Communication Skills  Sleep:      Musculoskeletal: Strength & Muscle Tone:  Gait & Station:  Patient leans:   Current Medications: Current Outpatient Prescriptions  Medication Sig Dispense Refill  . buPROPion (WELLBUTRIN XL) 150 MG 24 hr tablet 2 qam  60 tablet  5  . chlorzoxazone (PARAFON) 500 MG tablet Take 500 mg by mouth 4 (four) times daily as needed (migraine).      . cyclobenzaprine (FLEXERIL) 10 MG tablet Take 1 tablet (10 mg total) by mouth 3 (three) times daily as needed for muscle spasms.  90 tablet  3  . diazepam (VALIUM) 5 MG tablet Take 2 tablets bid  120 tablet  4  . HYDROcodone-acetaminophen (NORCO/VICODIN) 5-325 MG per tablet Take 1-2 tablets by mouth every 6 (six) hours as needed for moderate pain or severe pain.      Marland Kitchen  Levomilnacipran HCl ER (FETZIMA) 20 MG CP24 Take 40 mg by mouth every morning. 1 qam  30 capsule  5  . Multiple Vitamins-Minerals (MULTIVITAMIN WITH MINERALS) tablet Take 1 tablet by mouth every morning.       Marland Kitchen omeprazole (PRILOSEC) 40 MG capsule Take 40 mg by mouth every morning.       Marland Kitchen oxyCODONE-acetaminophen (PERCOCET) 5-325 MG per tablet Take 1 tablet by mouth every 6 (six) hours as needed.  10 tablet  0  . oxyCODONE-acetaminophen (PERCOCET/ROXICET) 5-325 MG per tablet Take 1 tablet by mouth every 6  (six) hours as needed for severe pain.  25 tablet  0  . propranolol (INDERAL) 20 MG tablet Take 1 tablet (20 mg total) by mouth 2 (two) times daily.  60 tablet  0  . temazepam (RESTORIL) 15 MG capsule Take 1 capsule (15 mg total) by mouth at bedtime as needed for sleep.  30 capsule  5  . traMADol (ULTRAM) 50 MG tablet Take 1 tablet (50 mg total) by mouth every 6 (six) hours as needed.  10 tablet  0  . triamterene-hydrochlorothiazide (DYAZIDE) 37.5-25 MG per capsule Take 1 capsule by mouth every morning.      . valACYclovir (VALTREX) 500 MG tablet Take 500 mg by mouth 2 (two) times daily as needed (virus flare).      . vitamin B-12 (CYANOCOBALAMIN) 1000 MCG tablet Take 1,000 mcg by mouth daily.      Marland Kitchen zonisamide (ZONEGRAN) 100 MG capsule Take 400 mg by mouth at bedtime.        No current facility-administered medications for this visit.    Lab Results: No results found for this or any previous visit (from the past 48 hour(s)).  Physical Findings: AIMS:  , ,  ,  ,    CIWA:    COWS:     Treatment Plan Summary: At this time the patient will increase her Fetzima to 40 mg. The patient will be seen in approximately 4-6 weeks and if she's not better I would consider increasing it further to 80 mg. The patient will continue taking Restoril to help her sleep at night and 10 mg of Valium twice a day. The patient will continue in school.  Plan:  Medical Decision Making Problem Points:  Established problem, worsening (2) Data Points:  Review of medication regiment & side effects (2)  I certify that inpatient services furnished can reasonably be expected to improve the patient's condition.   Charmane Protzman, [place] 01/12/2014, 12:03 PM

## 2014-01-15 ENCOUNTER — Telehealth (HOSPITAL_COMMUNITY): Payer: Self-pay | Admitting: *Deleted

## 2014-01-15 NOTE — Telephone Encounter (Signed)
Pt pharmacy calling due to pt Fetzima. Per pharmacy they needed to get better clarification of pt medication. Pharmacy wanted to find out if pt is taking 2 tablets of the 20 mg once a day which will make it 40 mg or is pt taking one capsule of the 20 mg? Per pharmacy provider only sent them 30 capsules and it's not enough if pt is taking 2 capsules a day. Per pt she is taking 2 tablets of the 20 mg and would like to have 40 mg tablets. Informed pharmacy that I would have to talk to provider to get permission to change the script from 20 mg tablets but take 40 mg worth to 40 mg tablets. Pharmacy agreed to call back.

## 2014-01-17 ENCOUNTER — Telehealth (HOSPITAL_COMMUNITY): Payer: Self-pay | Admitting: *Deleted

## 2014-01-17 MED ORDER — LEVOMILNACIPRAN HCL ER 40 MG PO CP24: 40.0000 mg | ORAL_CAPSULE | Freq: Every day | ORAL | Status: DC

## 2014-01-17 NOTE — Telephone Encounter (Signed)
Per notes on file 01-12-14, patient will increase her Fetzima to 40 mg which was noted my Dr. Casimiro Needle. Per pt she is take 20 mg BID but would rather just take the 40 mg tablets once a day. Per Dr. Casimiro Needle to change the mg in system to 40 mg tablets and have pt take it once a day. Called pharmacy and they verbalized understanding.

## 2014-01-17 NOTE — Telephone Encounter (Signed)
Changed pt medication in system and sent it to pharmacy per Dr. Casimiro Needle permission.

## 2014-01-17 NOTE — Telephone Encounter (Signed)
Pt pharmacy requesting new script to be sent into pharmacy for 40 mg QD for pt Fetzima with the right quantity. Spoke to pharmacy and they are aware that Dr. Casimiro Needle approves to fill pt Fetzima 40 mg QD.

## 2014-02-21 ENCOUNTER — Encounter (HOSPITAL_COMMUNITY): Payer: Self-pay | Admitting: Emergency Medicine

## 2014-02-21 ENCOUNTER — Emergency Department (HOSPITAL_COMMUNITY)
Admission: EM | Admit: 2014-02-21 | Discharge: 2014-02-21 | Disposition: A | Discharge status: Home / Self Care | Payer: Medicaid Other | Attending: Emergency Medicine | Admitting: Emergency Medicine

## 2014-02-21 DIAGNOSIS — F419 Anxiety disorder, unspecified: Secondary | ICD-10-CM | POA: Diagnosis not present

## 2014-02-21 DIAGNOSIS — Z880 Allergy status to penicillin: Secondary | ICD-10-CM | POA: Insufficient documentation

## 2014-02-21 DIAGNOSIS — Z720 Tobacco use: Secondary | ICD-10-CM | POA: Insufficient documentation

## 2014-02-21 DIAGNOSIS — I10 Essential (primary) hypertension: Secondary | ICD-10-CM | POA: Diagnosis not present

## 2014-02-21 DIAGNOSIS — R51 Headache: Secondary | ICD-10-CM

## 2014-02-21 DIAGNOSIS — K219 Gastro-esophageal reflux disease without esophagitis: Secondary | ICD-10-CM | POA: Insufficient documentation

## 2014-02-21 DIAGNOSIS — G43909 Migraine, unspecified, not intractable, without status migrainosus: Secondary | ICD-10-CM | POA: Diagnosis not present

## 2014-02-21 DIAGNOSIS — F319 Bipolar disorder, unspecified: Secondary | ICD-10-CM | POA: Diagnosis not present

## 2014-02-21 DIAGNOSIS — Z79899 Other long term (current) drug therapy: Secondary | ICD-10-CM | POA: Insufficient documentation

## 2014-02-21 DIAGNOSIS — M25579 Pain in unspecified ankle and joints of unspecified foot: Secondary | ICD-10-CM | POA: Diagnosis not present

## 2014-02-21 DIAGNOSIS — Z8639 Personal history of other endocrine, nutritional and metabolic disease: Secondary | ICD-10-CM | POA: Insufficient documentation

## 2014-02-21 DIAGNOSIS — R519 Headache, unspecified: Secondary | ICD-10-CM

## 2014-02-21 DIAGNOSIS — Z86010 Personal history of colonic polyps: Secondary | ICD-10-CM | POA: Insufficient documentation

## 2014-02-21 MED ORDER — DIPHENHYDRAMINE HCL 25 MG PO CAPS
25.0000 mg | ORAL_CAPSULE | Freq: Once | ORAL | Status: AC
  Administered 2014-02-21: 25 mg via ORAL
  Filled 2014-02-21: qty 1

## 2014-02-21 MED ORDER — KETOROLAC TROMETHAMINE 60 MG/2ML IM SOLN
60.0000 mg | Freq: Once | INTRAMUSCULAR | Status: AC
  Administered 2014-02-21: 60 mg via INTRAMUSCULAR
  Filled 2014-02-21: qty 2

## 2014-02-21 NOTE — ED Notes (Signed)
Pt states that she got steroid injections on 02/09/14.  Pt states that she since has been having lt sided foot pain.  Also has had a migraine since then.  Sees a migraine specialist but does not have an appt for a while.  Denies NVD.

## 2014-02-21 NOTE — Discharge Instructions (Signed)
You are having a headache. No specific cause was found today for your headache. It may have been a migraine or other cause of headache. Stress, anxiety, fatigue, and depression are common triggers for headaches. Your headache today does not appear to be life-threatening or require hospitalization, but often the exact cause of headaches is not determined in the emergency department. Therefore, follow-up with your doctor is very important to find out what may have caused your headache, and whether or not you need any further diagnostic testing or treatment. Sometimes headaches can appear benign (not harmful), but then more serious symptoms can develop which should prompt an immediate re-evaluation by your doctor or the emergency department. SEEK MEDICAL ATTENTION IF: You develop possible problems with medications prescribed.  The medications don't resolve your headache, if it recurs , or if you have multiple episodes of vomiting or can't take fluids. You have a change from the usual headache. RETURN IMMEDIATELY IF you develop a sudden, severe headache or confusion, become poorly responsive or faint, develop a fever above 100.84F or problem breathing, have a change in speech, vision, swallowing, or understanding, or develop new weakness, numbness, tingling, incoordination, or have a seizure.  Recurrent Migraine Headache A migraine headache is an intense, throbbing pain on one or both sides of your head. Recurrent migraines keep coming back. A migraine can last for 30 minutes to several hours. CAUSES  The exact cause of a migraine headache is not always known. However, a migraine may be caused when nerves in the brain become irritated and release chemicals that cause inflammation. This causes pain. Certain things may also trigger migraines, such as:   Alcohol.  Smoking.  Stress.  Menstruation.  Aged cheeses.  Foods or drinks that contain nitrates, glutamate, aspartame, or tyramine.  Lack of  sleep.  Chocolate.  Caffeine.  Hunger.  Physical exertion.  Fatigue.  Medicines used to treat chest pain (nitroglycerine), birth control pills, estrogen, and some blood pressure medicines. SYMPTOMS   Pain on one or both sides of your head.  Pulsating or throbbing pain.  Severe pain that prevents daily activities.  Pain that is aggravated by any physical activity.  Nausea, vomiting, or both.  Dizziness.  Pain with exposure to bright lights, loud noises, or activity.  General sensitivity to bright lights, loud noises, or smells. Before you get a migraine, you may get warning signs that a migraine is coming (aura). An aura may include:  Seeing flashing lights.  Seeing bright spots, halos, or zigzag lines.  Having tunnel vision or blurred vision.  Having feelings of numbness or tingling.  Having trouble talking.  Having muscle weakness. DIAGNOSIS  A recurrent migraine headache is often diagnosed based on:  Symptoms.  Physical examination.  A CT scan or MRI of your head. These imaging tests cannot diagnose migraines but can help rule out other causes of headaches.  TREATMENT  Medicines may be given for pain and nausea. Medicines can also be given to help prevent recurrent migraines. HOME CARE INSTRUCTIONS  Only take over-the-counter or prescription medicines for pain or discomfort as directed by your health care provider. The use of long-term narcotics is not recommended.  Lie down in a dark, quiet room when you have a migraine.  Keep a journal to find out what may trigger your migraine headaches. For example, write down:  What you eat and drink.  How much sleep you get.  Any change to your diet or medicines.  Limit alcohol consumption.  Quit smoking if  you smoke.  Get 7-9 hours of sleep, or as recommended by your health care provider.  Limit stress.  Keep lights dim if bright lights bother you and make your migraines worse. SEEK MEDICAL CARE  IF:   You do not get relief from the medicines given to you.  You have a recurrence of pain.  You have a fever. SEEK IMMEDIATE MEDICAL CARE IF:  Your migraine becomes severe.  You have a stiff neck.  You have loss of vision.  You have muscular weakness or loss of muscle control.  You start losing your balance or have trouble walking.  You feel faint or pass out.  You have severe symptoms that are different from your first symptoms. MAKE SURE YOU:   Understand these instructions.  Will watch your condition.  Will get help right away if you are not doing well or get worse. Document Released: 12/16/2000 Document Revised: 08/07/2013 Document Reviewed: 11/28/2012 [facility] Patient Information 2015 [place], [place]. This information is not intended to replace advice given to you by your health care provider. Make sure you discuss any questions you have with your health care provider.

## 2014-02-21 NOTE — ED Provider Notes (Signed)
CSN: [id number]     Arrival date & time 02/21/14  1424 History   First MD Initiated Contact with Patient 02/21/14 1607     Chief Complaint  Patient presents with  . Migraine  . Foot Pain     (Consider location/radiation/quality/duration/timing/severity/associated sxs/prior Treatment) HPI   Gloria Lewis is a(n) 58 y.o. female who presents with cc of HA, back pain and foot cramp. She has a long standing history of headaches, back pain. She is followed by a back specialist and Dr. Metta Clines ad lib. our neurology for chronic headaches. Patient states that last week she had a trigger point injection done in her right quadratus lumborum. She states it is still very tender to the touch. She states that this morning she got a severe cramp in that foot, which caused her to fall. She did not hit her head or lose consciousness. It lasted only a few seconds and resolved. The patient complains of a severe occipital headache that refers to the left eye. She states it is been going on for several days and got worse after trigger point injection. She is on propranolol 20 mg for migraine prophylaxis. Denies photophobia, phonophobia, UL throbbing, N/V, visual changes, stiff neck, neck pain, rash, or "thunderclap" onset. Denies unilateral weakness, facial asymmetry, difficulty with speech, change in gait, or vertigo.Denies weakness, loss of bowel/bladder function or saddle anesthesia. Denies neck stiffness, headache, rash.  Denies fever or recent procedures to back.    Past Medical History  Diagnosis Date  . HTN (hypertension)   . Bipolar 1 disorder   . OA (osteoarthritis)   . GERD (gastroesophageal reflux disease)   . Hypercholesterolemia   . Fever blister   . HA (headache)   . Colon polyp   . CTS (carpal tunnel syndrome)   . Schizo-affective psychosis   . Anxiety   . Depression   . Migraines    Past Surgical History  Procedure Laterality Date  . Neck surgery  2009  . Carpal tunnel release   20110 rt/lt  . Polp removed  2011  . Back injection    . 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 Anterior cervical decomp/discectomy fusion  08/27/2011    Procedure: ANTERIOR CERVICAL DECOMPRESSION/DISCECTOMY FUSION 1 LEVEL/HARDWARE REMOVAL;  Surgeon: Eustace Moore, MD;  Location: [facility];  Service: Neurosurgery;  Laterality: Bilateral;  Cervical four-five Anterior cervical decompression/diskectomy, fusion, Plate, Removal of Cervical five-seven Plate  . Hemorrhoid surgery     Family History  Problem Relation Age of Onset  . Coronary artery disease Father   . Hypertension Mother   . Schizophrenia Mother   . Depression Brother   . Prostate cancer Brother   . Anesthesia problems Neg Hx   . Hypotension Neg Hx   . Malignant hyperthermia Neg Hx   . Pseudochol deficiency Neg Hx   . Cancer Father     head neck    History  Substance Use Topics  . Smoking status: Current Some Day Smoker -- 0.25 packs/day for 30 years    Types: Cigarettes  . Smokeless tobacco: Never Used  . Alcohol Use: Yes   OB History    No data available     Review of Systems  Ten systems reviewed and are negative for acute change, except as noted in the HPI.    Allergies  Amoxicillin; Chantix; Effexor; Norco; Paroxetine hcl; Penicillins; Zithromax; and Hydrocodone  Home Medications  Prior to Admission medications   Medication Sig Start Date End Date Taking? Authorizing Provider  chlorzoxazone (PARAFON) 500 MG tablet Take 500 mg by mouth 4 (four) times daily as needed (migraine).   Yes Historical Provider, MD  cyclobenzaprine (FLEXERIL) 10 MG tablet Take 1 tablet (10 mg total) by mouth 3 (three) times daily as needed for muscle spasms. 01/08/14  Yes Adam Melvern Sample, DO  diazepam (VALIUM) 5 MG tablet Take 2 tablets bid Patient taking differently: Take 10 mg by mouth 2 (two) times daily. Take 2 tablets bid 01/12/14  Yes Norma Fredrickson, MD  folic acid (FOLVITE) 1 MG  tablet Take 1 mg by mouth daily.   Yes Historical Provider, MD  Multiple Vitamins-Minerals (MULTIVITAMIN WITH MINERALS) tablet Take 1 tablet by mouth every morning.    Yes Historical Provider, MD  omeprazole (PRILOSEC) 40 MG capsule Take 40 mg by mouth every morning.    Yes Historical Provider, MD  PRESCRIPTION MEDICATION Inject 1 each into the skin once. Cortisone injection in back.   Yes Historical Provider, MD  propranolol (INDERAL) 40 MG tablet Take 40 mg by mouth 2 (two) times daily.   Yes Historical Provider, MD  temazepam (RESTORIL) 15 MG capsule Take 1 capsule (15 mg total) by mouth at bedtime as needed for sleep. 01/12/14  Yes Norma Fredrickson, MD  valACYclovir (VALTREX) 500 MG tablet Take 500 mg by mouth 2 (two) times daily as needed (virus flare).   Yes Historical Provider, MD  vitamin B-12 (CYANOCOBALAMIN) 1000 MCG tablet Take 1,000 mcg by mouth daily.   Yes Historical Provider, MD  zonisamide (ZONEGRAN) 100 MG capsule Take 400 mg by mouth at bedtime.    Yes Historical Provider, MD  buPROPion (WELLBUTRIN XL) 150 MG 24 hr tablet 2 qam Patient not taking: Reported on 02/21/2014 11/01/13   Norma Fredrickson, MD  Levomilnacipran HCl ER 40 MG CP24 Take 40 mg by mouth daily. Patient not taking: Reported on 02/21/2014 01/17/14   Norma Fredrickson, MD  oxyCODONE-acetaminophen (PERCOCET) 5-325 MG per tablet Take 1 tablet by mouth every 6 (six) hours as needed. Patient not taking: Reported on 02/21/2014 04/11/13   Ernestina Patches, MD  oxyCODONE-acetaminophen (PERCOCET/ROXICET) 5-325 MG per tablet Take 1 tablet by mouth every 6 (six) hours as needed for severe pain. Patient not taking: Reported on 02/21/2014 03/06/13   Linus Mako, PA-C  propranolol (INDERAL) 20 MG tablet Take 1 tablet (20 mg total) by mouth 2 (two) times daily. Patient not taking: Reported on 02/21/2014 01/08/14   Dudley Major, DO  traMADol (ULTRAM) 50 MG tablet Take 1 tablet (50 mg total) by mouth every 6 (six) hours as  needed. Patient not taking: Reported on 02/21/2014 10/03/13   Robyn M Hess, PA-C   BP 137/80 mmHg  Pulse 65  Temp(Src) 97.9 F (36.6 C) (Oral)  Resp 18  SpO2 100% Physical Exam  Constitutional: She is oriented to person, place, and time. She appears well-developed and well-nourished. No distress.  HENT:  Head: Normocephalic and atraumatic.  Mouth/Throat: Oropharynx is clear and moist.  Eyes: Conjunctivae and EOM are normal. Pupils are equal, round, and reactive to light. No scleral icterus.  No horizontal, vertical or rotational nystagmus  Neck: Normal range of motion. Neck supple.  Full active and passive ROM without pain No midline or paraspinal tenderness No nuchal rigidity or meningeal signs  Cardiovascular: Normal rate, regular rhythm and intact distal pulses.   Pulmonary/Chest: Effort normal and breath sounds normal. No respiratory distress. She has  no wheezes. She has no rales.  Abdominal: Soft. Bowel sounds are normal. There is no tenderness. There is no rebound and no guarding.  Musculoskeletal: Normal range of motion.  Lymphadenopathy:    She has no cervical adenopathy.  Neurological: She is alert and oriented to person, place, and time. She has normal reflexes. No cranial nerve deficit. She exhibits normal muscle tone. Coordination normal.  Mental Status:  Alert, oriented, thought content appropriate. Speech fluent without evidence of aphasia. Able to follow 2 step commands without difficulty.  Cranial Nerves:  II:  Peripheral visual fields grossly normal, pupils equal, round, reactive to light III,IV, VI: ptosis not present, extra-ocular motions intact bilaterally  V,VII: smile symmetric, facial light touch sensation equal VIII: hearing grossly normal bilaterally  IX,X: gag reflex present  XI: bilateral shoulder shrug equal and strong XII: midline tongue extension  Motor:  5/5 in upper and lower extremities bilaterally including strong and equal grip strength and  dorsiflexion/plantar flexion Sensory: Pinprick and light touch normal in all extremities.  Deep Tendon Reflexes: 2+ and symmetric  Cerebellar: normal finger-to-nose with bilateral upper extremities Gait: normal gait and balance CV: distal pulses palpable throughout   Skin: Skin is warm and dry. No rash noted. She is not diaphoretic.  Psychiatric: She has a normal mood and affect. Her behavior is normal. Judgment and thought content normal.  Nursing note and vitals reviewed.   ED Course  Procedures (including critical care time) Labs Review Labs Reviewed - No data to display  Imaging Review No results found.   EKG Interpretation None      MDM   Final diagnoses:  Bad headache   Pt HA treated and improved while in ED.  Presentation is like pts typical HA and non concerning for [facility], ICH, Meningitis, or temporal arteritis. Pt is afebrile with no focal neuro deficits, nuchal rigidity, or change in vision. Pt is to follow up with PCP to discuss prophylactic medication. Pt verbalizes understanding and is agreeable with plan to dc.      Margarita Mail, PA-C 02/25/14 [place] Knapp, MD 02/27/14 [phone]

## 2014-02-22 ENCOUNTER — Telehealth: Payer: Self-pay | Admitting: Neurology

## 2014-02-22 NOTE — Telephone Encounter (Signed)
Patient is stating she has a really bad headache  She went to ED  Last night  Was given  Cocktail  It helped for a while but headache is not any better she does not want to take prednisone it makes her face swell please advise

## 2014-02-22 NOTE — Telephone Encounter (Signed)
Pt called wanting to speak to a nurse regarding her current headaches. Please call pt # [phone]

## 2014-02-23 ENCOUNTER — Ambulatory Visit (INDEPENDENT_AMBULATORY_CARE_PROVIDER_SITE_OTHER): Payer: Medicaid Other | Admitting: *Deleted

## 2014-02-23 DIAGNOSIS — D518 Other vitamin B12 deficiency anemias: Secondary | ICD-10-CM

## 2014-02-23 DIAGNOSIS — G43009 Migraine without aura, not intractable, without status migrainosus: Secondary | ICD-10-CM

## 2014-02-23 MED ORDER — METOCLOPRAMIDE HCL 5 MG/ML IJ SOLN
10.0000 mg | Freq: Once | INTRAVENOUS | Status: AC
  Administered 2014-02-23: 10 mg via INTRAMUSCULAR

## 2014-02-23 MED ORDER — KETOROLAC TROMETHAMINE 60 MG/2ML IM SOLN: 60.0000 mg | Freq: Once | INTRAMUSCULAR | Status: AC

## 2014-02-23 MED ORDER — DIPHENHYDRAMINE HCL 50 MG/ML IJ SOLN
12.5000 mg | Freq: Once | INTRAMUSCULAR | Status: AC
  Administered 2014-02-23: 12.5 mg via INTRAMUSCULAR

## 2014-02-23 NOTE — Telephone Encounter (Signed)
Patient is coming in for  Injections  She is already taking propranolol 40 mg BID

## 2014-02-23 NOTE — Telephone Encounter (Signed)
Pt called this morning f/u on the message from yesterday. Pt is having another bad heache. Please call pt # [phone]

## 2014-02-23 NOTE — Telephone Encounter (Signed)
If she is not open to prednisone taper, then all we can offer is another cocktail.  These are the treatments for persistent headache.  We can increase propranolol to 40mg  twice daily.

## 2014-02-26 ENCOUNTER — Telehealth: Payer: Self-pay | Admitting: *Deleted

## 2014-02-26 NOTE — Telephone Encounter (Signed)
Pt called f/u on the message from earlier this morning. C/B [phone]

## 2014-02-26 NOTE — Telephone Encounter (Signed)
Left message for patient to call office.  

## 2014-02-26 NOTE — Telephone Encounter (Signed)
Patient has a headache she states it is not a migraine but it is a headache Call back number [phone]

## 2014-02-26 NOTE — Telephone Encounter (Signed)
There is nothing else I can do acutely to stop the headaches.  She has a history of daily headaches to begin with.  It will have to take time.

## 2014-02-26 NOTE — Telephone Encounter (Signed)
Patient states she is having a headache it is not a migraine  It is really bad when she gets out in the [place] it is worse . She states the injections did help some . She is thinking that the steroid injection is what may have causes the migraine please advise

## 2014-03-09 ENCOUNTER — Ambulatory Visit (HOSPITAL_COMMUNITY): Payer: Self-pay | Admitting: Psychiatry

## 2014-03-12 ENCOUNTER — Other Ambulatory Visit: Payer: Self-pay | Admitting: Obstetrics and Gynecology

## 2014-03-12 ENCOUNTER — Other Ambulatory Visit (HOSPITAL_COMMUNITY)
Admission: RE | Admit: 2014-03-12 | Discharge: 2014-03-12 | Disposition: A | Discharge status: Home / Self Care | Payer: Medicaid Other | Source: Ambulatory Visit | Attending: Obstetrics and Gynecology | Admitting: Obstetrics and Gynecology

## 2014-03-12 DIAGNOSIS — Z01419 Encounter for gynecological examination (general) (routine) without abnormal findings: Secondary | ICD-10-CM | POA: Insufficient documentation

## 2014-03-12 DIAGNOSIS — Z1151 Encounter for screening for human papillomavirus (HPV): Secondary | ICD-10-CM | POA: Insufficient documentation

## 2014-03-13 ENCOUNTER — Telehealth: Payer: Self-pay | Admitting: Neurology

## 2014-03-13 ENCOUNTER — Other Ambulatory Visit: Payer: Self-pay | Admitting: *Deleted

## 2014-03-13 LAB — CYTOLOGY - PAP

## 2014-03-13 MED ORDER — PROPRANOLOL HCL 40 MG PO TABS: 40.0000 mg | ORAL_TABLET | Freq: Two times a day (BID) | ORAL | Status: DC

## 2014-03-13 NOTE — Telephone Encounter (Signed)
Pt has some questions about medication but does not know the name of it [phone]

## 2014-03-23 ENCOUNTER — Ambulatory Visit (HOSPITAL_COMMUNITY): Payer: Self-pay | Admitting: Psychiatry

## 2014-04-13 ENCOUNTER — Ambulatory Visit: Payer: Self-pay | Admitting: Neurology

## 2014-04-13 ENCOUNTER — Ambulatory Visit (INDEPENDENT_AMBULATORY_CARE_PROVIDER_SITE_OTHER): Payer: Federal, State, Local not specified - Other | Admitting: Psychiatry

## 2014-04-13 VITALS — BP 145/99 | HR 90 | Ht 67.0 in | Wt 203.8 lb

## 2014-04-13 DIAGNOSIS — F332 Major depressive disorder, recurrent severe without psychotic features: Secondary | ICD-10-CM

## 2014-04-13 DIAGNOSIS — F321 Major depressive disorder, single episode, moderate: Secondary | ICD-10-CM

## 2014-04-13 MED ORDER — DIAZEPAM 5 MG PO TABS: 10.0000 mg | ORAL_TABLET | Freq: Two times a day (BID) | ORAL | Status: DC

## 2014-04-13 MED ORDER — TEMAZEPAM 15 MG PO CAPS: ORAL_CAPSULE | ORAL | Status: DC

## 2014-04-13 MED ORDER — LEVOMILNACIPRAN HCL ER 40 MG PO CP24: 40.0000 mg | ORAL_CAPSULE | Freq: Every day | ORAL | Status: DC

## 2014-04-13 MED ORDER — DIAZEPAM 5 MG PO TABS: 5.0000 mg | ORAL_TABLET | Freq: Four times a day (QID) | ORAL | Status: DC | PRN

## 2014-04-13 NOTE — Progress Notes (Signed)
[facility] MD Progress Note  04/13/2014 10:36 AM Gloria Lewis  MRN:  [id number] Subjective: Gloria Lewis Patient says she's had 6 people die in the last couple of months. She is grieving to some degree but she is able to continue at school. She only has a few more semesters. The patient says she's having great difficulty sleeping which is a major change for her. Generally she says she is depressed but says only when she's not doing something meaningful. In essence when she is active and doing something purposely she doesn't feel depressed which is somewhat inconsistent with major depression. She is eating well her energy level is low and she's having some problems concentrating though she reads without problems. Her sinuses unfortunately left therefore and she misses him. The patient continues her antidepressant at 40 mg. The patient also has leg pain. The patient is somewhat below her baseline but I do not believe it is necessary to increase her antidepressant at this time. The best intervention would be to simply increase her sleeping aid which is will do. Diagnosis:   DSM5: Schizophrenia Disorders:   Obsessive-Compulsive Disorders:   Trauma-Stressor Disorders:   Substance/Addictive Disorders:   Depressive Disorders:  Major Depressive Disorder - Mild (296.21) Total Time spent with patient: 30 minutes  Axis I: Major Depression, Recurrent severe  ADL's:  Intact  Sleep   poor  Appetite:  Good  Suicidal Ideation:   Homicidal Ideation:  AEB (as evidenced by):  Psychiatric Specialty Exam: Physical Exam  ROS  Blood pressure 145/99, pulse 90, height 5\' 7"  (1.702 m), weight 203 lb 12.8 oz (92.443 kg).Body mass index is 31.91 kg/(m^2).  General Appearance:   Eye Contact::  Good  Speech:  Normal Rate  Volume:  Normal  Mood:  Depressed  Affect:  Appropriate and Blunt  Thought Process:  Coherent  Orientation:  Full (Time, Place, and Person)  Thought Content:  WDL  Suicidal Thoughts:  No   Homicidal Thoughts:  No  Memory:  NA  Judgement:  Good  Insight:  Good  Psychomotor Activity:  Normal  Concentration:  Good  Recall:  Gloria Lewis of Knowledge:Good  Language: Good  Akathisia:  No  Handed:  Right  AIMS (if indicated):     Assets:  Desire for Improvement  Sleep:      Musculoskeletal: Strength & Muscle Tone:  Gait & Station:  Patient leans:   Current Medications: Current Outpatient Prescriptions  Medication Sig Dispense Refill  . buPROPion (WELLBUTRIN XL) 150 MG 24 hr tablet 2 qam (Patient not taking: Reported on 02/21/2014) 60 tablet 5  . chlorzoxazone (PARAFON) 500 MG tablet Take 500 mg by mouth 4 (four) times daily as needed (migraine).    . cyclobenzaprine (FLEXERIL) 10 MG tablet Take 1 tablet (10 mg total) by mouth 3 (three) times daily as needed for muscle spasms. 90 tablet 3  . diazepam (VALIUM) 5 MG tablet Take 1 tablet (5 mg total) by mouth every 6 (six) hours as needed for anxiety. 30 tablet 0  . diazepam (VALIUM) 5 MG tablet Take 2 tablets (10 mg total) by mouth 2 (two) times daily. 235 tablet 4  . folic acid (FOLVITE) 1 MG tablet Take 1 mg by mouth daily.    . Levomilnacipran HCl ER 40 MG CP24 Take 40 mg by mouth daily. 30 capsule 4  . Multiple Vitamins-Minerals (MULTIVITAMIN WITH MINERALS) tablet Take 1 tablet by mouth every morning.     Marland Kitchen omeprazole (PRILOSEC) 40 MG capsule Take 40  mg by mouth every morning.     Marland Kitchen oxyCODONE-acetaminophen (PERCOCET) 5-325 MG per tablet Take 1 tablet by mouth every 6 (six) hours as needed. (Patient not taking: Reported on 02/21/2014) 10 tablet 0  . oxyCODONE-acetaminophen (PERCOCET/ROXICET) 5-325 MG per tablet Take 1 tablet by mouth every 6 (six) hours as needed for severe pain. (Patient not taking: Reported on 02/21/2014) 25 tablet 0  . PRESCRIPTION MEDICATION Inject 1 each into the skin once. Cortisone injection in back.    . propranolol (INDERAL) 20 MG tablet Take 1 tablet (20 mg total) by mouth 2 (two) times daily.  (Patient not taking: Reported on 02/21/2014) 60 tablet 0  . propranolol (INDERAL) 40 MG tablet Take 1 tablet (40 mg total) by mouth 2 (two) times daily. 60 tablet 2  . temazepam (RESTORIL) 15 MG capsule 2  qhs 60 capsule 5  . traMADol (ULTRAM) 50 MG tablet Take 1 tablet (50 mg total) by mouth every 6 (six) hours as needed. (Patient not taking: Reported on 02/21/2014) 10 tablet 0  . valACYclovir (VALTREX) 500 MG tablet Take 500 mg by mouth 2 (two) times daily as needed (virus flare).    . vitamin B-12 (CYANOCOBALAMIN) 1000 MCG tablet Take 1,000 mcg by mouth daily.    Marland Kitchen zonisamide (ZONEGRAN) 100 MG capsule Take 400 mg by mouth at bedtime.      No current facility-administered medications for this visit.    Lab Results: No results found for this or any previous visit (from the past 48 hour(s)).  Physical Findings: AIMS:  , ,  ,  ,    CIWA:    COWS:     Treatment Plan Summary: At this time we'll shall increase this patient's Restoril from 15 mg at night to 30 mg. She'll continue on Fetzima 40 mg. The patient she'll be seen again in 2 months and in fact of her mood isn't significantly improved I would consider increasing it to 80 mg. But again will take a hard close look at the issue of major depression. Vertical depression that is from major depression usually is persistent regardless of whether or not you're doing something that feels good. The patient says when she is doing nothing she feels depressed when she is active and busy she has no depression. I think a good night sleep will help her energy and her ability to concentrate. The patient plans to continue in school. The patient will continue in therapy. This patient to return to see me in 2 months  Plan:  Medical Decision Making Problem Points:  Established problem, worsening (2) Data Points:  Review of medication regiment & side effects (2)  I certify that inpatient services furnished can reasonably be expected to improve the patient's  condition.   Gloria Lewis, [place] 04/13/2014, 10:36 AM

## 2014-04-16 ENCOUNTER — Encounter: Payer: Self-pay | Admitting: Neurology

## 2014-04-16 ENCOUNTER — Telehealth: Payer: Self-pay | Admitting: Neurology

## 2014-04-16 ENCOUNTER — Ambulatory Visit (INDEPENDENT_AMBULATORY_CARE_PROVIDER_SITE_OTHER): Payer: Medicaid Other | Admitting: Neurology

## 2014-04-16 VITALS — BP 146/82 | HR 78 | Temp 98.1°F | Resp 18 | Ht 67.0 in | Wt 206.9 lb

## 2014-04-16 DIAGNOSIS — G44229 Chronic tension-type headache, not intractable: Secondary | ICD-10-CM

## 2014-04-16 MED ORDER — NORTRIPTYLINE HCL 10 MG PO CAPS: 10.0000 mg | ORAL_CAPSULE | Freq: Every day | ORAL | Status: DC

## 2014-04-16 NOTE — Progress Notes (Signed)
NEUROLOGY FOLLOW UP OFFICE NOTE  Gloria Lewis [id number]  HISTORY OF PRESENT ILLNESS: Gloria Lewis is a 59 year old right-handed woman with history of hypertension, prior drug and alcohol abuse, major depressive disorder, bipolar disorder, schizoaffective disorder, migraines, tension headache, cervical disc disease status post 2 surgeries, lumbar degenerative disc disease, osteoarthritis, prolapsed bladder, hypercholesterolemia, IBS, and GERD who follows up for chronic cervicogenic migraines.  Prior urgent care records reviewed.  UPDATE: She has had increase in tension type headaches related to stress.  She continues to have problems with her son.  Also, 5 people close to her have passed away since last visit.  She received injections which helped neck pain. Intensity:  8/10 Duration:  3-4 hours Frequency:  daily  Current abortive therapy:  Tramadol 50mg  (she was taking every 6 hours until running out last Monday), cyclobenzaprine 10mg  Current preventative therapy:  propranolol 40mg  twice daily (increased depression), zonisamide 400mg  (ineffective) Other current medications:  fluoxetine, N82, MVI, folic acid, omeprazole  Caffeine:  no Alcohol:  no Smoker:  no Diet:  good Exercise:  swims Depression/stress:  Increased stress this year due to mental health of her son, which has caused increase in headache Sleep hygiene:  Falls asleep quickly but always wakes up at 4am, but feels refreshed  HISTORY: Onset:  Since childhood, but worse since 2009 Location:  Bi-temporal and at base of her skull, neck pain sometimes radiating down into right shoulder Quality:  Constant severe aching (other daily headaches are throbbing). Initial intensity:  8/10 Aura:  no Prodrome:  no Associated symptoms:  Nausea, photophobia, phonophobia, osmophobia, blurred vision Initial Duration:  1 hour if lays down to rest (otherwise all day) Initial Frequency:  3 times a week (but has daily  headaches) Triggers/exacerbating factors:  Stress, sleeping on right side (where she had cervical spine surgery), movement, neck pain Relieving factors:  Laying down to rest in dark room Activity:  Cannot function  Past abortive therapy:  Advil, Aleve, Tylenol, cyclobenzaprine (helped), chlorpromazine HCL 25-50mg  Past preventative therapy:  Trigger point injections (helped briefly), topamax (effective but told to get off for unknown reason), Zoloft (used for depression, made headache worse), PT of the neck, Accupuncture, zonisamide 400mg   PAST MEDICAL HISTORY: Past Medical History  Diagnosis Date  . HTN (hypertension)   . Bipolar 1 disorder   . OA (osteoarthritis)   . GERD (gastroesophageal reflux disease)   . Hypercholesterolemia   . Fever blister   . HA (headache)   . Colon polyp   . CTS (carpal tunnel syndrome)   . Schizo-affective psychosis   . Anxiety   . Depression   . Migraines     MEDICATIONS: Current Outpatient Prescriptions on File Prior to Visit  Medication Sig Dispense Refill  . chlorzoxazone (PARAFON) 500 MG tablet Take 500 mg by mouth 4 (four) times daily as needed (migraine).    . cyclobenzaprine (FLEXERIL) 10 MG tablet Take 1 tablet (10 mg total) by mouth 3 (three) times daily as needed for muscle spasms. 90 tablet 3  . diazepam (VALIUM) 5 MG tablet Take 1 tablet (5 mg total) by mouth every 6 (six) hours as needed for anxiety. 30 tablet 0  . diazepam (VALIUM) 5 MG tablet Take 2 tablets (10 mg total) by mouth 2 (two) times daily. 956 tablet 4  . folic acid (FOLVITE) 1 MG tablet Take 1 mg by mouth daily.    . Levomilnacipran HCl ER 40 MG CP24 Take 40 mg by mouth daily. 30 capsule  4  . Multiple Vitamins-Minerals (MULTIVITAMIN WITH MINERALS) tablet Take 1 tablet by mouth every morning.     Marland Kitchen omeprazole (PRILOSEC) 40 MG capsule Take 40 mg by mouth every morning.     Marland Kitchen PRESCRIPTION MEDICATION Inject 1 each into the skin once. Cortisone injection in back.    .  propranolol (INDERAL) 40 MG tablet Take 1 tablet (40 mg total) by mouth 2 (two) times daily. 60 tablet 2  . temazepam (RESTORIL) 15 MG capsule 2  qhs 60 capsule 5  . valACYclovir (VALTREX) 500 MG tablet Take 500 mg by mouth 2 (two) times daily as needed (virus flare).    . vitamin B-12 (CYANOCOBALAMIN) 1000 MCG tablet Take 1,000 mcg by mouth daily.    Marland Kitchen zonisamide (ZONEGRAN) 100 MG capsule Take 400 mg by mouth at bedtime.     Marland Kitchen buPROPion (WELLBUTRIN XL) 150 MG 24 hr tablet 2 qam (Patient not taking: Reported on 02/21/2014) 60 tablet 5  . oxyCODONE-acetaminophen (PERCOCET) 5-325 MG per tablet Take 1 tablet by mouth every 6 (six) hours as needed. (Patient not taking: Reported on 02/21/2014) 10 tablet 0  . oxyCODONE-acetaminophen (PERCOCET/ROXICET) 5-325 MG per tablet Take 1 tablet by mouth every 6 (six) hours as needed for severe pain. (Patient not taking: Reported on 02/21/2014) 25 tablet 0  . propranolol (INDERAL) 20 MG tablet Take 1 tablet (20 mg total) by mouth 2 (two) times daily. (Patient not taking: Reported on 04/16/2014) 60 tablet 0   No current facility-administered medications on file prior to visit.    ALLERGIES: Allergies  Allergen Reactions  . Amoxicillin Itching  . Chantix [Varenicline Tartrate] Nausea Only  . Effexor [Venlafaxine Hydrochloride] Itching and Other (See Comments)    headache  . Norco [Hydrocodone-Acetaminophen] Itching  . Paroxetine Hcl Other (See Comments)    headache  . Penicillins Hives  . Zithromax [Azithromycin Dihydrate] Swelling  . Hydrocodone Other (See Comments)    headache    FAMILY HISTORY: Family History  Problem Relation Age of Onset  . Coronary artery disease Father   . Hypertension Mother   . Schizophrenia Mother   . Depression Brother   . Prostate cancer Brother   . Anesthesia problems Neg Hx   . Hypotension Neg Hx   . Malignant hyperthermia Neg Hx   . Pseudochol deficiency Neg Hx   . Cancer Father     head neck     SOCIAL  HISTORY: History   Social History  . Marital Status: Single    Spouse Name: N/A    Number of Children: N/A  . Years of Education: N/A   Occupational History  . Not on file.   Social History Main Topics  . Smoking status: Current Some Day Smoker -- 0.25 packs/day for 30 years    Types: Cigarettes  . Smokeless tobacco: Never Used  . Alcohol Use: No  . Drug Use: No  . Sexual Activity: No   Other Topics Concern  . Not on file   Social History Narrative    REVIEW OF SYSTEMS: Constitutional: No fevers, chills, or sweats, no generalized fatigue, change in appetite Eyes: No visual changes, double vision, eye pain Ear, nose and throat: No hearing loss, ear pain, nasal congestion, sore throat Cardiovascular: No chest pain, palpitations Respiratory:  No shortness of breath at rest or with exertion, wheezes GastrointestinaI: No nausea, vomiting, diarrhea, abdominal pain, fecal incontinence Genitourinary:  No dysuria, urinary retention or frequency Musculoskeletal:  No neck pain, back pain Integumentary: No rash, pruritus,  skin lesions Neurological: as above Psychiatric: No depression, insomnia, anxiety Endocrine: No palpitations, fatigue, diaphoresis, mood swings, change in appetite, change in weight, increased thirst Hematologic/Lymphatic:  No anemia, purpura, petechiae. Allergic/Immunologic: no itchy/runny eyes, nasal congestion, recent allergic reactions, rashes  PHYSICAL EXAM: Filed Vitals:   04/16/14 1349  BP: 146/82  Pulse: 78  Temp: 98.1 F (36.7 C)  Resp: 18   General: No acute distress Head:  Normocephalic/atraumatic Eyes:  Fundoscopic exam unremarkable without vessel changes, exudates, hemorrhages or papilledema. Neck: supple, no paraspinal tenderness, full range of motion Heart:  Regular rate and rhythm Lungs:  Clear to auscultation bilaterally Back: No paraspinal tenderness Neurological Exam: alert and oriented to person, place, and time. Attention span and  concentration intact, recent and remote memory intact, fund of knowledge intact.  Speech fluent and not dysarthric, language intact.  CN II-XII intact. Fundoscopic exam unremarkable without vessel changes, exudates, hemorrhages or papilledema.  Bulk and tone normal, muscle strength 5/5 throughout.  Sensation to light touch, temperature and vibration intact.  Deep tendon reflexes 2+ throughout, toes downgoing.  Finger to nose and heel to shin testing intact.  Gait normal, Romberg negative.  IMPRESSION: Tension-type headaches  PLAN: 1.  Will start nortriptyline 10mg  at bedtime.  Discussed potential side effects including small risk of serotonin syndrome with fluoxetine. 2.  Stop tramadol.  Try Excedrin Tension or Migraine.  Cyclobenzaprine at bedtime as needed.  Limit to no more than 2 days out of the week. 3.  Will taper off zonisamide and propranolol 4.  Sleep hygiene discussed 5.  Follow up in 3 months (call in 4 weeks with update)  Metta Clines, DO  CC:  Harlan Stains, MD

## 2014-04-16 NOTE — Telephone Encounter (Signed)
Pt called to r/s 04/13/14 appt. Pt called on the day of providing less than 24 hours prior notice. Appt marked as a no show but letter will not be sent-pt has appt today / Sherri S.

## 2014-04-16 NOTE — Patient Instructions (Signed)
1.  Start nortriptyline 10mg  at bedtime.  Be aware of low risk for serotonin syndrome with fluoxetine. 2.  We will slowly get off of zonisamide.  Take 300mg  daily for 7 days, then 200mg  daily for 7 days then 100mg  daily for 7 days, then stop 3.  We will also stop propranolol.  Take 20mg  twice daily for 7 days then stop. 4.  For headache, take Excedrin tension or Excedrin migraine.  Stop tramadol 5.  Follow sleep hygiene sheet 6.  Follow up in 3 months but call in 4 weeks with update.

## 2014-04-20 ENCOUNTER — Telehealth (HOSPITAL_COMMUNITY): Payer: Self-pay

## 2014-04-20 NOTE — Telephone Encounter (Signed)
Prior authorization form complete and awaiting Dr. Casimiro Needle signature.

## 2014-05-14 ENCOUNTER — Other Ambulatory Visit (HOSPITAL_COMMUNITY): Payer: Self-pay

## 2014-05-14 NOTE — Telephone Encounter (Signed)
Fax request refill for patient's Bupropion HCL ER (XL) 150mg  2 pills every morning received today.  Patient reported she was not taking on 02/21/14 but last filled final refill from 11/01/13 order with 5 refills on 04/26/14.  Patient's next evaluation set for 07/06/14.

## 2014-05-17 ENCOUNTER — Other Ambulatory Visit (HOSPITAL_COMMUNITY): Payer: Self-pay | Admitting: *Deleted

## 2014-05-17 NOTE — Telephone Encounter (Signed)
Refill request for Wellbutrin not appropriate as it has been discontinued. Notified [facility].

## 2014-05-23 NOTE — Telephone Encounter (Signed)
Yes  Refill her Wellbutrin

## 2014-06-14 ENCOUNTER — Telehealth: Payer: Self-pay | Admitting: *Deleted

## 2014-06-14 NOTE — Telephone Encounter (Signed)
She should increase nortriptyline to 25mg  at bedtime.  We can also start tizanidine as well.  I would start with 2mg  at bedtime for 1 week, then 2mg  twice daily for 1 week, then 2mg  three times daily.

## 2014-06-14 NOTE — Telephone Encounter (Signed)
Patient states she has a very bad headache  The Pamelor she is taking but it is not helping she is not taking Excedrin Tension or Migraine. She is still taking Propranolol I advised her if it was really bad she might need to go to ED . Please advise

## 2014-06-14 NOTE — Telephone Encounter (Signed)
Patient not feeling well today a very strong headache please call C/B [phone]

## 2014-06-15 ENCOUNTER — Other Ambulatory Visit: Payer: Self-pay | Admitting: *Deleted

## 2014-06-15 DIAGNOSIS — G43009 Migraine without aura, not intractable, without status migrainosus: Secondary | ICD-10-CM

## 2014-06-15 MED ORDER — TIZANIDINE HCL 4 MG PO TABS: 2.0000 mg | ORAL_TABLET | Freq: Four times a day (QID) | ORAL | Status: DC | PRN

## 2014-06-15 NOTE — Telephone Encounter (Signed)
Patient will increast nortriptyline to 25 mg at HS also 2mg  at bedtime for 1 week, then 2mg  twice daily for 1 week, then 2mg  three times daily.# 90 with 1 refill was call to pharmacy spoke with Lanny Hurst

## 2014-06-21 ENCOUNTER — Encounter (HOSPITAL_COMMUNITY): Payer: Self-pay | Admitting: Emergency Medicine

## 2014-06-21 ENCOUNTER — Other Ambulatory Visit (HOSPITAL_COMMUNITY): Payer: Self-pay

## 2014-06-21 ENCOUNTER — Emergency Department (HOSPITAL_COMMUNITY): Payer: Medicaid Other

## 2014-06-21 ENCOUNTER — Observation Stay (HOSPITAL_COMMUNITY)
Admit: 2014-06-21 | Discharge: 2014-06-24 | Disposition: A | Discharge status: Home / Self Care | Payer: Medicaid Other | Attending: Cardiology | Admitting: Cardiology

## 2014-06-21 DIAGNOSIS — G43909 Migraine, unspecified, not intractable, without status migrainosus: Secondary | ICD-10-CM | POA: Insufficient documentation

## 2014-06-21 DIAGNOSIS — R61 Generalized hyperhidrosis: Secondary | ICD-10-CM | POA: Insufficient documentation

## 2014-06-21 DIAGNOSIS — M1990 Unspecified osteoarthritis, unspecified site: Secondary | ICD-10-CM | POA: Insufficient documentation

## 2014-06-21 DIAGNOSIS — R0602 Shortness of breath: Secondary | ICD-10-CM | POA: Diagnosis present

## 2014-06-21 DIAGNOSIS — E785 Hyperlipidemia, unspecified: Secondary | ICD-10-CM | POA: Insufficient documentation

## 2014-06-21 DIAGNOSIS — F419 Anxiety disorder, unspecified: Secondary | ICD-10-CM | POA: Insufficient documentation

## 2014-06-21 DIAGNOSIS — F32A Depression, unspecified: Secondary | ICD-10-CM | POA: Diagnosis present

## 2014-06-21 DIAGNOSIS — Z981 Arthrodesis status: Secondary | ICD-10-CM | POA: Insufficient documentation

## 2014-06-21 DIAGNOSIS — F411 Generalized anxiety disorder: Secondary | ICD-10-CM | POA: Diagnosis present

## 2014-06-21 DIAGNOSIS — Z881 Allergy status to other antibiotic agents status: Secondary | ICD-10-CM | POA: Insufficient documentation

## 2014-06-21 DIAGNOSIS — F319 Bipolar disorder, unspecified: Secondary | ICD-10-CM | POA: Insufficient documentation

## 2014-06-21 DIAGNOSIS — Z79899 Other long term (current) drug therapy: Secondary | ICD-10-CM

## 2014-06-21 DIAGNOSIS — R0789 Other chest pain: Secondary | ICD-10-CM | POA: Insufficient documentation

## 2014-06-21 DIAGNOSIS — Z888 Allergy status to other drugs, medicaments and biological substances status: Secondary | ICD-10-CM | POA: Diagnosis not present

## 2014-06-21 DIAGNOSIS — R079 Chest pain, unspecified: Secondary | ICD-10-CM | POA: Diagnosis present

## 2014-06-21 DIAGNOSIS — F323 Major depressive disorder, single episode, severe with psychotic features: Secondary | ICD-10-CM | POA: Diagnosis not present

## 2014-06-21 DIAGNOSIS — Z8249 Family history of ischemic heart disease and other diseases of the circulatory system: Secondary | ICD-10-CM | POA: Insufficient documentation

## 2014-06-21 DIAGNOSIS — F17210 Nicotine dependence, cigarettes, uncomplicated: Secondary | ICD-10-CM | POA: Insufficient documentation

## 2014-06-21 DIAGNOSIS — R0600 Dyspnea, unspecified: Secondary | ICD-10-CM | POA: Diagnosis not present

## 2014-06-21 DIAGNOSIS — E780 Pure hypercholesterolemia: Secondary | ICD-10-CM | POA: Diagnosis not present

## 2014-06-21 DIAGNOSIS — R072 Precordial pain: Secondary | ICD-10-CM | POA: Diagnosis present

## 2014-06-21 DIAGNOSIS — I10 Essential (primary) hypertension: Secondary | ICD-10-CM | POA: Diagnosis not present

## 2014-06-21 DIAGNOSIS — F329 Major depressive disorder, single episode, unspecified: Secondary | ICD-10-CM

## 2014-06-21 DIAGNOSIS — K219 Gastro-esophageal reflux disease without esophagitis: Secondary | ICD-10-CM | POA: Diagnosis present

## 2014-06-21 DIAGNOSIS — Z86010 Personal history of colonic polyps: Secondary | ICD-10-CM | POA: Diagnosis not present

## 2014-06-21 DIAGNOSIS — Z880 Allergy status to penicillin: Secondary | ICD-10-CM | POA: Diagnosis not present

## 2014-06-21 DIAGNOSIS — Z886 Allergy status to analgesic agent status: Secondary | ICD-10-CM | POA: Diagnosis not present

## 2014-06-21 LAB — BASIC METABOLIC PANEL
Anion gap: 13 (ref 5–15)
BUN: 11 mg/dL (ref 6–23)
CALCIUM: 10.2 mg/dL (ref 8.4–10.5)
CHLORIDE: 100 mmol/L (ref 96–112)
CO2: 28 mmol/L (ref 19–32)
Creatinine, Ser: 0.81 mg/dL (ref 0.50–1.10)
GFR calc Af Amer: >90 mL/min (ref >90)
GFR, EST NON AFRICAN AMERICAN: 79 mL/min — AB (ref >90)
GLUCOSE: 103 mg/dL — AB (ref 70–99)
POTASSIUM: 3.7 mmol/L (ref 3.5–5.1)
SODIUM: 141 mmol/L (ref 135–145)

## 2014-06-21 LAB — I-STAT TROPONIN, ED
Troponin i, poc: 0 ng/mL (ref 0.00–0.08)
Troponin i, poc: 0.01 ng/mL (ref 0.00–0.08)

## 2014-06-21 LAB — CBC
HCT: 46 % (ref 36.0–46.0)
Hemoglobin: 15.6 g/dL — ABNORMAL HIGH (ref 12.0–15.0)
MCH: 30.6 pg (ref 26.0–34.0)
MCHC: 33.9 g/dL (ref 30.0–36.0)
MCV: 90.4 fL (ref 78.0–100.0)
Platelets: 387 10*3/uL (ref 150–400)
RBC: 5.09 MIL/uL (ref 3.87–5.11)
RDW: 13.8 % (ref 11.5–15.5)
WBC: 7.5 10*3/uL (ref 4.0–10.5)

## 2014-06-21 LAB — PROTIME-INR
INR: 0.93 (ref 0.00–1.49)
Prothrombin Time: 12.6 seconds (ref 11.6–15.2)

## 2014-06-21 LAB — BRAIN NATRIURETIC PEPTIDE: B Natriuretic Peptide: 27.9 pg/mL (ref 0.0–100.0)

## 2014-06-21 LAB — APTT: APTT: 30 s (ref 24–37)

## 2014-06-21 MED ORDER — TRIAMTERENE-HCTZ 37.5-25 MG PO TABS
1.0000 | ORAL_TABLET | Freq: Every day | ORAL | Status: DC
  Administered 2014-06-22 – 2014-06-24 (×3): 1 via ORAL
  Filled 2014-06-21 (×3): qty 1

## 2014-06-21 MED ORDER — SUCRALFATE 1 GM/10ML PO SUSP
1.0000 g | Freq: Three times a day (TID) | ORAL | Status: DC
  Administered 2014-06-22 – 2014-06-24 (×7): 1 g via ORAL
  Filled 2014-06-21 (×8): qty 10

## 2014-06-21 MED ORDER — NORTRIPTYLINE HCL 10 MG PO CAPS
10.0000 mg | ORAL_CAPSULE | Freq: Every day | ORAL | Status: DC
  Administered 2014-06-22 – 2014-06-23 (×2): 10 mg via ORAL
  Filled 2014-06-21 (×4): qty 1

## 2014-06-21 MED ORDER — ONDANSETRON HCL 4 MG/2ML IJ SOLN: 4.0000 mg | Freq: Four times a day (QID) | INTRAMUSCULAR | Status: DC | PRN

## 2014-06-21 MED ORDER — ACETAMINOPHEN 500 MG PO TABS
1000.0000 mg | ORAL_TABLET | Freq: Once | ORAL | Status: AC
  Administered 2014-06-21: 1000 mg via ORAL
  Filled 2014-06-21: qty 2

## 2014-06-21 MED ORDER — ZONISAMIDE 100 MG PO CAPS
400.0000 mg | ORAL_CAPSULE | Freq: Every day | ORAL | Status: DC
  Filled 2014-06-21 (×4): qty 4

## 2014-06-21 MED ORDER — HEPARIN SODIUM (PORCINE) 5000 UNIT/ML IJ SOLN
5000.0000 [IU] | Freq: Three times a day (TID) | INTRAMUSCULAR | Status: DC
  Administered 2014-06-22 – 2014-06-24 (×8): 5000 [IU] via SUBCUTANEOUS
  Filled 2014-06-21 (×8): qty 1

## 2014-06-21 MED ORDER — LINACLOTIDE 290 MCG PO CAPS
290.0000 ug | ORAL_CAPSULE | Freq: Every day | ORAL | Status: DC
  Administered 2014-06-23: 290 ug via ORAL
  Filled 2014-06-21 (×3): qty 1

## 2014-06-21 MED ORDER — LORATADINE 10 MG PO TABS
10.0000 mg | ORAL_TABLET | Freq: Every day | ORAL | Status: DC
  Administered 2014-06-22 – 2014-06-24 (×3): 10 mg via ORAL
  Filled 2014-06-21 (×3): qty 1

## 2014-06-21 MED ORDER — ASPIRIN 81 MG PO CHEW
324.0000 mg | CHEWABLE_TABLET | Freq: Once | ORAL | Status: AC
  Administered 2014-06-21: 324 mg via ORAL
  Filled 2014-06-21: qty 4

## 2014-06-21 MED ORDER — ACETAMINOPHEN 325 MG PO TABS
650.0000 mg | ORAL_TABLET | ORAL | Status: DC | PRN
  Administered 2014-06-22: 650 mg via ORAL
  Filled 2014-06-21: qty 2

## 2014-06-21 MED ORDER — SODIUM CHLORIDE 0.9 % IV SOLN
INTRAVENOUS | Status: DC
  Administered 2014-06-21: 14:00:00 via INTRAVENOUS

## 2014-06-21 MED ORDER — MESALAMINE 1000 MG RE SUPP
1000.0000 mg | Freq: Every day | RECTAL | Status: DC | PRN
  Filled 2014-06-21: qty 1

## 2014-06-21 MED ORDER — ASPIRIN EC 81 MG PO TBEC
81.0000 mg | DELAYED_RELEASE_TABLET | Freq: Every day | ORAL | Status: DC
  Administered 2014-06-22 – 2014-06-24 (×3): 81 mg via ORAL
  Filled 2014-06-21 (×3): qty 1

## 2014-06-21 MED ORDER — TEMAZEPAM 15 MG PO CAPS
15.0000 mg | ORAL_CAPSULE | Freq: Every evening | ORAL | Status: DC | PRN
  Administered 2014-06-22 – 2014-06-23 (×2): 15 mg via ORAL
  Filled 2014-06-21 (×2): qty 1

## 2014-06-21 MED ORDER — ATORVASTATIN CALCIUM 10 MG PO TABS
10.0000 mg | ORAL_TABLET | Freq: Every day | ORAL | Status: DC
  Administered 2014-06-22 – 2014-06-23 (×2): 10 mg via ORAL
  Filled 2014-06-21 (×2): qty 1

## 2014-06-21 MED ORDER — TOPIRAMATE 25 MG PO TABS
25.0000 mg | ORAL_TABLET | Freq: Two times a day (BID) | ORAL | Status: DC
  Administered 2014-06-22 – 2014-06-24 (×5): 25 mg via ORAL
  Filled 2014-06-21 (×7): qty 1

## 2014-06-21 MED ORDER — LEVOMILNACIPRAN HCL ER 40 MG PO CP24: 40.0000 mg | ORAL_CAPSULE | Freq: Every day | ORAL | Status: DC

## 2014-06-21 MED ORDER — POTASSIUM CHLORIDE CRYS ER 20 MEQ PO TBCR
20.0000 meq | EXTENDED_RELEASE_TABLET | Freq: Every day | ORAL | Status: DC
  Administered 2014-06-22 – 2014-06-24 (×3): 20 meq via ORAL
  Filled 2014-06-21 (×3): qty 1

## 2014-06-21 MED ORDER — FLUTICASONE PROPIONATE 50 MCG/ACT NA SUSP
2.0000 | Freq: Every day | NASAL | Status: DC
  Administered 2014-06-22 – 2014-06-24 (×3): 2 via NASAL
  Filled 2014-06-21: qty 16

## 2014-06-21 MED ORDER — GI COCKTAIL ~~LOC~~
30.0000 mL | Freq: Four times a day (QID) | ORAL | Status: DC | PRN
  Administered 2014-06-23: 30 mL via ORAL
  Filled 2014-06-21: qty 30

## 2014-06-21 MED ORDER — FLUOXETINE HCL 20 MG PO CAPS
40.0000 mg | ORAL_CAPSULE | Freq: Every day | ORAL | Status: DC
  Administered 2014-06-22 – 2014-06-24 (×3): 40 mg via ORAL
  Filled 2014-06-21 (×3): qty 2

## 2014-06-21 MED ORDER — VITAMIN B-12 1000 MCG PO TABS
1000.0000 ug | ORAL_TABLET | Freq: Every day | ORAL | Status: DC
  Administered 2014-06-22 – 2014-06-24 (×3): 1000 ug via ORAL
  Filled 2014-06-21 (×3): qty 1

## 2014-06-21 MED ORDER — FOLIC ACID 1 MG PO TABS
1.0000 mg | ORAL_TABLET | Freq: Every day | ORAL | Status: DC
  Administered 2014-06-22 – 2014-06-24 (×3): 1 mg via ORAL
  Filled 2014-06-21 (×3): qty 1

## 2014-06-21 MED ORDER — DIAZEPAM 5 MG PO TABS
10.0000 mg | ORAL_TABLET | Freq: Two times a day (BID) | ORAL | Status: DC
  Administered 2014-06-22 – 2014-06-24 (×6): 10 mg via ORAL
  Filled 2014-06-21 (×6): qty 2

## 2014-06-21 MED ORDER — ADULT MULTIVITAMIN W/MINERALS CH
1.0000 | ORAL_TABLET | Freq: Every morning | ORAL | Status: DC
  Administered 2014-06-22 – 2014-06-24 (×3): 1 via ORAL
  Filled 2014-06-21 (×3): qty 1

## 2014-06-21 MED ORDER — PROPRANOLOL HCL 40 MG PO TABS
40.0000 mg | ORAL_TABLET | Freq: Two times a day (BID) | ORAL | Status: DC
  Administered 2014-06-22 – 2014-06-24 (×6): 40 mg via ORAL
  Filled 2014-06-21 (×6): qty 1

## 2014-06-21 MED ORDER — CHLORZOXAZONE 500 MG PO TABS
500.0000 mg | ORAL_TABLET | Freq: Four times a day (QID) | ORAL | Status: DC | PRN
  Filled 2014-06-21: qty 1

## 2014-06-21 MED ORDER — TIZANIDINE HCL 2 MG PO TABS
2.0000 mg | ORAL_TABLET | Freq: Four times a day (QID) | ORAL | Status: DC | PRN
  Administered 2014-06-22: 2 mg via ORAL
  Filled 2014-06-21 (×2): qty 1

## 2014-06-21 MED ORDER — MORPHINE SULFATE 2 MG/ML IJ SOLN: 2.0000 mg | INTRAMUSCULAR | Status: DC | PRN

## 2014-06-21 MED ORDER — PANTOPRAZOLE SODIUM 40 MG PO TBEC
40.0000 mg | DELAYED_RELEASE_TABLET | Freq: Two times a day (BID) | ORAL | Status: DC
  Administered 2014-06-22 – 2014-06-24 (×6): 40 mg via ORAL
  Filled 2014-06-21 (×6): qty 1

## 2014-06-21 NOTE — ED Notes (Signed)
Cardiology at bedside.

## 2014-06-21 NOTE — ED Notes (Signed)
Pt c/o dizziness onset yesterday, along with headache and SOB and joint swelling x several days, SOB worsening yesterday and exacerbated by exertion.

## 2014-06-21 NOTE — ED Notes (Signed)
Pt medicated for h/a as ordered.  Pt states that she was instructed not to take OTC for her migraine h/a, that she can only take the "cocktail."  [place] EDP made aware and okayed pt to take tylenol for her h/a.  Pt made aware was okay with.  Sandwich and drink also given to pt.  Waiting for hospitalist.

## 2014-06-21 NOTE — H&P (Addendum)
[facility] History and Physical  Gloria Lewis ZOX:[id number] DOB: 01-14-1956 DOA: 06/21/2014  Referring physician: Dr. Maryan Rued PCP: Vidal Schwalbe, MD   Chief Complaint: chest pain, SOB and indigestion  HPI: Gloria Lewis is a 59 y.o. female with PMH significant for HTN, HLD, bipolar disorder, migraines and GERD; presented to ED with complaints of CP and SOB. Patient reports pain has been present for the last 4 days or so, worst in the last 2 days prior to admission. Patient denies fever, palpitations, diaphoresis, nausea, vomiting, orthopnea, dizziness and HA. Patient endorses associated cough and some indigestion symptoms. Patient has noticed increase in her BP lately and has seen PCP for that (started on HCTZ and propanolol). In ED no EKG changes, but given hx and transient episode of elevated/uncontrolled BP was admitted for further evaluation and treatment and to r/o ACS.  Cardiology was consulted by ED and is looking to cycle troponin and check 2-De cho. If no major abnormalities seen, no further inpatient cardiovascular work up would be pursuit. planned.    Review of Systems:  Negative except as mentioned on HPI  Past Medical History  Diagnosis Date  . HTN (hypertension)   . Bipolar 1 disorder   . OA (osteoarthritis)   . GERD (gastroesophageal reflux disease)   . Hypercholesterolemia   . Fever blister   . HA (headache)   . Colon polyp   . CTS (carpal tunnel syndrome)   . Schizo-affective psychosis   . Anxiety   . Depression   . Migraines    Past Surgical History  Procedure Laterality Date  . Neck surgery  2009  . Carpal tunnel release  20110 rt/lt  . Polp removed  2011  . Back injection    . 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 Anterior cervical decomp/discectomy fusion  08/27/2011    Procedure: ANTERIOR CERVICAL DECOMPRESSION/DISCECTOMY FUSION 1 LEVEL/HARDWARE REMOVAL;  Surgeon: Eustace Moore, MD;  Location: [place]  [facility];  Service: Neurosurgery;  Laterality: Bilateral;  Cervical four-five Anterior cervical decompression/diskectomy, fusion, Plate, Removal of Cervical five-seven Plate  . Hemorrhoid surgery     Social History:  reports that she has been smoking Cigarettes.  She has a 7.5 pack-year smoking history. She has never used smokeless tobacco. She reports that she does not drink alcohol or use illicit drugs.  Allergies  Allergen Reactions  . Amoxicillin Itching  . Chantix [Varenicline Tartrate] Nausea Only  . Effexor [Venlafaxine Hydrochloride] Itching and Other (See Comments)    headache  . Norco [Hydrocodone-Acetaminophen] Itching  . Paroxetine Hcl Other (See Comments)    headache  . Penicillins Hives  . Tramadol     Pt states it interacted with her sertraline, but she is no longer on sertraline.  She does not remember the type of reaction she had.   . Zithromax [Azithromycin Dihydrate] Swelling  . Hydrocodone Other (See Comments)    headache    Family History  Problem Relation Age of Onset  . Coronary artery disease Father   . Hypertension Mother   . Schizophrenia Mother   . Depression Brother   . Prostate cancer Brother   . Anesthesia problems Neg Hx   . Hypotension Neg Hx   . Malignant hyperthermia Neg Hx   . Pseudochol deficiency Neg Hx   . Cancer Father     head neck     Prior to Admission medications   Medication Sig Start Date End  Date Taking? Authorizing Provider  atorvastatin (LIPITOR) 10 MG tablet Take 10 mg by mouth daily.   Yes Historical Provider, MD  cyclobenzaprine (FLEXERIL) 10 MG tablet Take 1 tablet (10 mg total) by mouth 3 (three) times daily as needed for muscle spasms. 01/08/14  Yes Adam Telford Nab, DO  diazepam (VALIUM) 5 MG tablet Take 2 tablets (10 mg total) by mouth 2 (two) times daily. 04/13/14  Yes Norma Fredrickson, MD  FLUoxetine (PROZAC) 40 MG capsule Take 40 mg by mouth daily.    Yes Historical Provider, MD  fluticasone (FLONASE) 50 MCG/ACT nasal spray  Place 2 sprays into both nostrils daily. 06/07/14  Yes Historical Provider, MD  folic acid (FOLVITE) 1 MG tablet Take 1 mg by mouth daily.   Yes Historical Provider, MD  Levomilnacipran HCl ER 40 MG CP24 Take 40 mg by mouth daily. 04/13/14  Yes Norma Fredrickson, MD  Linaclotide Rolan Lipa) 290 MCG CAPS capsule Take 290 mcg by mouth daily.   Yes Historical Provider, MD  Loratadine 10 MG CAPS Take 10 mg by mouth daily.   Yes Historical Provider, MD  mesalamine (CANASA) 1000 MG suppository Place 1,000 mg rectally daily as needed (severe constipation.).   Yes Historical Provider, MD  mirabegron ER (MYRBETRIQ) 50 MG TB24 tablet Take 50 mg by mouth every other day.   Yes Historical Provider, MD  Multiple Vitamins-Minerals (MULTIVITAMIN WITH MINERALS) tablet Take 1 tablet by mouth every morning.    Yes Historical Provider, MD  nortriptyline (PAMELOR) 10 MG capsule Take 1 capsule (10 mg total) by mouth at bedtime. 04/16/14  Yes Adam Telford Nab, DO  omeprazole (PRILOSEC) 40 MG capsule Take 40 mg by mouth every morning.    Yes Historical Provider, MD  potassium chloride SA (K-DUR,KLOR-CON) 20 MEQ tablet Take 20 mEq by mouth daily.   Yes Historical Provider, MD  propranolol (INDERAL) 40 MG tablet Take 1 tablet (40 mg total) by mouth 2 (two) times daily. 03/13/14  Yes Adam Telford Nab, DO  sulindac (CLINORIL) 200 MG tablet Take 200 mg by mouth 2 (two) times daily. 06/14/14  Yes Historical Provider, MD  temazepam (RESTORIL) 15 MG capsule 2  qhs 04/13/14  Yes Norma Fredrickson, MD  tiZANidine (ZANAFLEX) 4 MG tablet Take 0.5 tablets (2 mg total) by mouth every 6 (six) hours as needed for muscle spasms (as directed 2mg  at bedtime for 1 week, then 2mg  twice daily for 1 week, then 2mg  three times daily.). 2mg  at bedtime for 1 week, then 2mg  twice daily for 1 week, then 2mg  three times daily. 06/15/14  Yes Adam Telford Nab, DO  triamterene-hydrochlorothiazide (MAXZIDE-25) 37.5-25 MG per tablet Take 1 tablet by mouth daily.   Yes Historical  Provider, MD  valACYclovir (VALTREX) 1000 MG tablet Take 1,000 mg by mouth daily.   Yes Historical Provider, MD  vitamin B-12 (CYANOCOBALAMIN) 1000 MCG tablet Take 1,000 mcg by mouth daily.   Yes Historical Provider, MD  buPROPion (WELLBUTRIN XL) 150 MG 24 hr tablet 2 qam Patient not taking: Reported on 02/21/2014 11/01/13   Norma Fredrickson, MD  chlorzoxazone (PARAFON) 500 MG tablet Take 500 mg by mouth 4 (four) times daily as needed (migraine).    Historical Provider, MD  diazepam (VALIUM) 5 MG tablet Take 1 tablet (5 mg total) by mouth every 6 (six) hours as needed for anxiety. Patient not taking: Reported on 06/21/2014 04/13/14   Norma Fredrickson, MD  oxyCODONE-acetaminophen (PERCOCET) 5-325 MG per tablet Take 1 tablet by mouth every 6 (six) hours  as needed. Patient not taking: Reported on 02/21/2014 04/11/13   Ernestina Patches, MD  oxyCODONE-acetaminophen (PERCOCET/ROXICET) 5-325 MG per tablet Take 1 tablet by mouth every 6 (six) hours as needed for severe pain. Patient not taking: Reported on 02/21/2014 03/06/13   Delos Haring, PA-C  PRESCRIPTION MEDICATION Inject 1 each into the skin once. Cortisone injection in back.    Historical Provider, MD  propranolol (INDERAL) 20 MG tablet Take 1 tablet (20 mg total) by mouth 2 (two) times daily. Patient not taking: Reported on 04/16/2014 01/08/14   Pieter Partridge, DO  topiramate (TOPAMAX) 25 MG capsule Take 25 mg by mouth 2 (two) times daily. 02/08/14   Historical Provider, MD  valACYclovir (VALTREX) 500 MG tablet Take 500 mg by mouth 2 (two) times daily as needed (virus flare).    Historical Provider, MD  zonisamide (ZONEGRAN) 100 MG capsule Take 400 mg by mouth at bedtime.     Historical Provider, MD   Physical Exam: Filed Vitals:   06/21/14 1613 06/21/14 1814 06/21/14 2145 06/21/14 2228  BP: 150/103 124/86 133/97 137/86  Pulse: 82 81 80 79  Temp:  97.9 F (36.6 C) 97.8 F (36.6 C) 97.7 F (36.5 C)  TempSrc:  Oral Oral Oral  Resp: 22 18 18 16   Height:     5\' 6"  (1.676 m)  Weight:    92.352 kg (203 lb 9.6 oz)  SpO2: 100% 99% 98% 99%    Wt Readings from Last 3 Encounters:  06/21/14 92.352 kg (203 lb 9.6 oz)  04/16/14 93.849 kg (206 lb 14.4 oz)  04/13/14 92.443 kg (203 lb 12.8 oz)    General:  Appears calm and comfortable; currently with 1/10 chest discomfort only present if she cough. No palpitations, no nausea, no vomiting, no diaphoresis. Eyes: PERRL, normal lids, irises & conjunctiva, no icterus or nystagmus ENT: grossly normal hearing, lips & tongue, no exudates, no erythema; no drainage out of ears Neck: no LAD, masses or thyromegaly, no JVD Cardiovascular: RRR, no m/r/g. No LE edema. Telemetry: SR, no arrhythmias  Respiratory: CTA bilaterally, no w/r/r. Normal respiratory effort. Abdomen: soft, mild epigastric discomfort with deep palpation, no distension, positive BS Skin: no rash or induration seen on exam Musculoskeletal: grossly normal tone BUE/BLE, FROM Psychiatric: grossly normal mood and affect, speech fluent and appropriate Neurologic: grossly non-focal.          Labs on Admission:  Basic Metabolic Panel:  Recent Labs Lab 06/21/14 1227  NA 141  K 3.7  CL 100  CO2 28  GLUCOSE 103*  BUN 11  CREATININE 0.81  CALCIUM 10.2   CBC:  Recent Labs Lab 06/21/14 1227  WBC 7.5  HGB 15.6*  HCT 46.0  MCV 90.4  PLT 387   BNP (last 3 results)  Recent Labs  06/21/14 1227  BNP 27.9    Radiological Exams on Admission: Dg Chest 2 View  06/21/2014   CLINICAL DATA:  Shortness of breath and cough for 3 days  EXAM: CHEST  2 VIEW  COMPARISON:  12/21/2013  FINDINGS: Cardiac shadow is stable. Some bibasilar scarring is again seen. No focal infiltrate or sizable effusion is noted. Mild degenerative changes of the thoracic spine are seen.  IMPRESSION: Bibasilar scarring, no acute abnormality noted.   Electronically Signed   By: Inez Catalina M.D.   On: 06/21/2014 17:44    EKG:  No acute ischemic changes appreciated;  sinus rhythm   Assessment/Plan 1-Precordial pain: patient hx and symptoms more consistent with GERD;  but given risk factors has been admitted for ACS R/O -admit to telemetry -cycle EKG and troponin -check 2-D echo -continue b-blocker and ASA -PRN oxygen supplementation -started on PPI and carafate -cardiology was consulted by ED and will follow rec's -continue statins  2-Depressive type psychosis and anxiety: stable. No SI or hallucinations -plan is to continue current psychiatry medication regimen  3-SOB (shortness of breath): intermittent and most likely secondary to allergic rhinitis/bronchitis -continue claritin and PPI -no fever or frank infiltrates on CXR; will hold abx's -PRN oxygen supplementation available  4-HTN (hypertension): fluctuating currently -just recently have her antihypertensive drugs adjusted -will continue low sodium diet/heart healthy diet -PRN hydralazine  5-GERD (gastroesophageal reflux disease): started on PPI BID and carafate  6-Migraines: will continue home meds. No HA at this moment  7-HLD: continue lipitor -will check lipid panel   Cardiology (Dr. Mare Ferrari)  Code Status: Full DVT Prophylaxis:heparin  Family Communication: no family at bedside  Disposition Plan: LOS < 2 midnights, observation; telemetry   Time spent: 50 minutes  Barton Dubois [facility] Pager [phone]

## 2014-06-21 NOTE — ED Notes (Signed)
MD at bedside. 

## 2014-06-21 NOTE — ED Provider Notes (Signed)
CSN: [id number]     Arrival date & time 06/21/14  1206 History   First MD Initiated Contact with Patient 06/21/14 1220     Chief Complaint  Patient presents with  . Shortness of Breath  . Joint Swelling     (Consider location/radiation/quality/duration/timing/severity/associated sxs/prior Treatment) Patient is a 59 y.o. female presenting with shortness of breath.  Shortness of Breath Severity:  Severe Onset quality:  Gradual Duration:  2 days Timing:  Intermittent Progression:  Worsening Chronicity:  New Context: activity   Relieved by:  Rest Worsened by:  Exertion Associated symptoms: diaphoresis   Associated symptoms: no chest pain, no fever and no vomiting   Associated symptoms comment:  Nausea   Past Medical History  Diagnosis Date  . HTN (hypertension)   . Bipolar 1 disorder   . OA (osteoarthritis)   . GERD (gastroesophageal reflux disease)   . Hypercholesterolemia   . Fever blister   . HA (headache)   . Colon polyp   . CTS (carpal tunnel syndrome)   . Schizo-affective psychosis   . Anxiety   . Depression   . Migraines    Past Surgical History  Procedure Laterality Date  . Neck surgery  2009  . Carpal tunnel release  20110 rt/lt  . Polp removed  2011  . Back injection    . 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 Anterior cervical decomp/discectomy fusion  08/27/2011    Procedure: ANTERIOR CERVICAL DECOMPRESSION/DISCECTOMY FUSION 1 LEVEL/HARDWARE REMOVAL;  Surgeon: Eustace Moore, MD;  Location: [facility];  Service: Neurosurgery;  Laterality: Bilateral;  Cervical four-five Anterior cervical decompression/diskectomy, fusion, Plate, Removal of Cervical five-seven Plate  . Hemorrhoid surgery     Family History  Problem Relation Age of Onset  . Coronary artery disease Father   . Hypertension Mother   . Schizophrenia Mother   . Depression Brother   . Prostate cancer Brother   . Anesthesia problems Neg Hx   .  Hypotension Neg Hx   . Malignant hyperthermia Neg Hx   . Pseudochol deficiency Neg Hx   . Cancer Father     head neck    History  Substance Use Topics  . Smoking status: Current Some Day Smoker -- 0.25 packs/day for 30 years    Types: Cigarettes  . Smokeless tobacco: Never Used  . Alcohol Use: No   OB History    No data available     Review of Systems  Constitutional: Positive for diaphoresis. Negative for fever.  Respiratory: Positive for shortness of breath.   Cardiovascular: Negative for chest pain.  Gastrointestinal: Negative for vomiting.  All other systems reviewed and are negative.     Allergies  Amoxicillin; Chantix; Effexor; Norco; Paroxetine hcl; Penicillins; Zithromax; and Hydrocodone  Home Medications   Prior to Admission medications   Medication Sig Start Date End Date Taking? Authorizing Provider  buPROPion (WELLBUTRIN XL) 150 MG 24 hr tablet 2 qam Patient not taking: Reported on 02/21/2014 11/01/13   Norma Fredrickson, MD  chlorzoxazone (PARAFON) 500 MG tablet Take 500 mg by mouth 4 (four) times daily as needed (migraine).    Historical Provider, MD  cyclobenzaprine (FLEXERIL) 10 MG tablet Take 1 tablet (10 mg total) by mouth 3 (three) times daily as needed for muscle spasms. 01/08/14   Pieter Partridge, DO  diazepam (VALIUM) 5 MG tablet Take 1 tablet (5 mg total) by mouth every 6 (six) hours as needed  for anxiety. 04/13/14   Norma Fredrickson, MD  diazepam (VALIUM) 5 MG tablet Take 2 tablets (10 mg total) by mouth 2 (two) times daily. 04/13/14   Norma Fredrickson, MD  FLUoxetine (PROZAC) 40 MG capsule Take 40 mg by mouth.    Historical Provider, MD  folic acid (FOLVITE) 1 MG tablet Take 1 mg by mouth daily.    Historical Provider, MD  Levomilnacipran HCl ER 40 MG CP24 Take 40 mg by mouth daily. 04/13/14   Norma Fredrickson, MD  Multiple Vitamins-Minerals (MULTIVITAMIN WITH MINERALS) tablet Take 1 tablet by mouth every morning.     Historical Provider, MD  nortriptyline (PAMELOR)  10 MG capsule Take 1 capsule (10 mg total) by mouth at bedtime. 04/16/14   Pieter Partridge, DO  omeprazole (PRILOSEC) 40 MG capsule Take 40 mg by mouth every morning.     Historical Provider, MD  oxyCODONE-acetaminophen (PERCOCET) 5-325 MG per tablet Take 1 tablet by mouth every 6 (six) hours as needed. Patient not taking: Reported on 02/21/2014 04/11/13   Ernestina Patches, MD  oxyCODONE-acetaminophen (PERCOCET/ROXICET) 5-325 MG per tablet Take 1 tablet by mouth every 6 (six) hours as needed for severe pain. Patient not taking: Reported on 02/21/2014 03/06/13   Delos Haring, PA-C  PRESCRIPTION MEDICATION Inject 1 each into the skin once. Cortisone injection in back.    Historical Provider, MD  propranolol (INDERAL) 20 MG tablet Take 1 tablet (20 mg total) by mouth 2 (two) times daily. Patient not taking: Reported on 04/16/2014 01/08/14   Pieter Partridge, DO  propranolol (INDERAL) 40 MG tablet Take 1 tablet (40 mg total) by mouth 2 (two) times daily. 03/13/14   Pieter Partridge, DO  temazepam (RESTORIL) 15 MG capsule 2  qhs 04/13/14   Norma Fredrickson, MD  tiZANidine (ZANAFLEX) 4 MG tablet Take 0.5 tablets (2 mg total) by mouth every 6 (six) hours as needed for muscle spasms (as directed 2mg  at bedtime for 1 week, then 2mg  twice daily for 1 week, then 2mg  three times daily.). 2mg  at bedtime for 1 week, then 2mg  twice daily for 1 week, then 2mg  three times daily. 06/15/14   Pieter Partridge, DO  topiramate (TOPAMAX) 25 MG capsule Take 25 mg by mouth 2 (two) times daily. 02/08/14   Historical Provider, MD  valACYclovir (VALTREX) 500 MG tablet Take 500 mg by mouth 2 (two) times daily as needed (virus flare).    Historical Provider, MD  vitamin B-12 (CYANOCOBALAMIN) 1000 MCG tablet Take 1,000 mcg by mouth daily.    Historical Provider, MD  zonisamide (ZONEGRAN) 100 MG capsule Take 400 mg by mouth at bedtime.     Historical Provider, MD   BP 157/117 mmHg  Pulse 104  Temp(Src) 98.1 F (36.7 C) (Oral)  Resp 17  SpO2  96% Physical Exam  Constitutional: She is oriented to person, place, and time. She appears well-developed and well-nourished. No distress.  HENT:  Head: Normocephalic and atraumatic.  Mouth/Throat: Oropharynx is clear and moist.  Eyes: Conjunctivae are normal. Pupils are equal, round, and reactive to light. No scleral icterus.  Neck: Neck supple.  Cardiovascular: Normal rate, regular rhythm, normal heart sounds and intact distal pulses.   No murmur heard. Pulmonary/Chest: Effort normal and breath sounds normal. No stridor. No respiratory distress. She has no rales.  Abdominal: Soft. Bowel sounds are normal. She exhibits no distension. There is no tenderness.  Musculoskeletal: Normal range of motion.  Neurological: She is alert and oriented to person, place, and  time.  Skin: Skin is warm and dry. No rash noted.  Psychiatric: She has a normal mood and affect. Her behavior is normal.  Nursing note and vitals reviewed.   ED Course  Procedures (including critical care time) Labs Review Labs Reviewed  CBC - Abnormal; Notable for the following:    Hemoglobin 15.6 (*)    All other components within normal limits  BASIC METABOLIC PANEL - Abnormal; Notable for the following:    Glucose, Bld 103 (*)    GFR calc non Af Amer 79 (*)    All other components within normal limits  BRAIN NATRIURETIC PEPTIDE  APTT  PROTIME-INR  I-STAT TROPOININ, ED  I-STAT TROPOININ, ED    Imaging Review No results found.   EKG Interpretation   Date/Time:  Thursday June 21 2014 12:13:44 EDT Ventricular Rate:  103 PR Interval:  84 QRS Duration: 179 QT Interval:  381 QTC Calculation: 499 R Axis:   22 Text Interpretation:  Sinus or ectopic atrial tachycardia Probable left  atrial enlargement IVCD, consider atypical RBBB LVH with secondary  repolarization abnormality Repol abnrm, severe global ischemia (LM/MVD)  Baseline wander in lead(s) V6 compared to prior, rate has increased and  nonspecific ST  changes now present. Confirmed by [facility]  MD, TREY ([id number])  on 06/21/2014 4:50:19 PM      MDM   Final diagnoses:  Essential hypertension  SOB (shortness of breath)    59 yo female with shortness of breath on exertion.   12:31 Paged Dr. Irish Lack to review triage EKG.  Was told he would review and call back.   13:00 Spoke with cardiology, who stated they did not think EKG consistent with STEMI.  Planned to consult.   Cardiology plans to admit.    Serita Grit, MD 06/21/14 1726

## 2014-06-21 NOTE — Consult Note (Signed)
CARDIOLOGY CONSULT NOTE   Patient ID: Gloria Lewis MRN: [id number] DOB/AGE: 1956/02/16 59 y.o.  Admit date: 06/21/2014  Primary Physician   Vidal Schwalbe, MD Primary Cardiologist   New Reason for Consultation   Chest pain  OIB:BCWUG L Tocci is a 59 y.o. year old female with a history of HTN, GERD, HL, FH CAD, bipolar, anxiety, depression, and psychosis, but no CAD.  Ms. Tukes has been experiencing shortness of breath for a few days. The SOB is better when lying flat, worsened by sitting up, exertion. No significant LE edema. Denies PND. Some "indigestion", rare and not exertional. Yesterday, she took her BP and it was elevated so she went to her MD, who restarted her Dyazide and put her on propanolol.  Today, she woke with a migraine. Did her usual activities and took her BP at work, which was very high, so she came to the ER. In the ER, she complained of some chest discomfort (gas, relieved by belching), and was given ASA  324 mg, which helped. She is not SOB at rest, on O2. The Myrbetriq helps the the bladder issues, but gives her a headache. She took it yesterday, but not today.   Past Medical History  Diagnosis Date  . HTN (hypertension)   . Bipolar 1 disorder   . OA (osteoarthritis)   . GERD (gastroesophageal reflux disease)   . Hypercholesterolemia   . Fever blister   . HA (headache)   . Colon polyp   . CTS (carpal tunnel syndrome)   . Schizo-affective psychosis   . Anxiety   . Depression   . Migraines      Past Surgical History  Procedure Laterality Date  . Neck surgery  2009  . Carpal tunnel release  20110 rt/lt  . Polp removed  2011  . Back injection    . 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 Anterior cervical decomp/discectomy fusion  08/27/2011    Procedure: ANTERIOR CERVICAL DECOMPRESSION/DISCECTOMY FUSION 1 LEVEL/HARDWARE REMOVAL;  Surgeon: Eustace Moore, MD;  Location: [facility];  Service:  Neurosurgery;  Laterality: Bilateral;  Cervical four-five Anterior cervical decompression/diskectomy, fusion, Plate, Removal of Cervical five-seven Plate  . Hemorrhoid surgery      Allergies  Allergen Reactions  . Amoxicillin Itching  . Chantix [Varenicline Tartrate] Nausea Only  . Effexor [Venlafaxine Hydrochloride] Itching and Other (See Comments)    headache  . Norco [Hydrocodone-Acetaminophen] Itching  . Paroxetine Hcl Other (See Comments)    headache  . Penicillins Hives  . Tramadol     Pt states it interacted with her sertraline, but she is no longer on sertraline.  She does not remember the type of reaction she had.   . Zithromax [Azithromycin Dihydrate] Swelling  . Hydrocodone Other (See Comments)    headache    I have reviewed the patient's current medications   . sodium chloride 20 mL/hr at 06/21/14 1346   Medication Sig  atorvastatin (LIPITOR) 10 MG tablet Take 10 mg by mouth daily.  cyclobenzaprine (FLEXERIL) 10 MG tablet Take 1 tablet (10 mg total) by mouth 3 (three) times daily as needed for muscle spasms.  diazepam (VALIUM) 5 MG tablet Take 2 tablets (10 mg total) by mouth 2 (two) times daily.  FLUoxetine (PROZAC) 40 MG capsule Take 40 mg by mouth daily.   fluticasone (FLONASE) 50 MCG/ACT nasal spray Place 2 sprays into both nostrils daily.  folic  acid (FOLVITE) 1 MG tablet Take 1 mg by mouth daily.  Levomilnacipran HCl ER 40 MG CP24 Take 40 mg by mouth daily.  Linaclotide (LINZESS) 290 MCG CAPS capsule Take 290 mcg by mouth daily.  Loratadine 10 MG CAPS Take 10 mg by mouth daily.  mesalamine (CANASA) 1000 MG suppository Place 1,000 mg rectally daily as needed (severe constipation.).  mirabegron ER (MYRBETRIQ) 50 MG TB24 tablet Take 50 mg by mouth every other day.  Multiple Vitamins-Minerals (MULTIVITAMIN WITH MINERALS) tablet Take 1 tablet by mouth every morning.   nortriptyline (PAMELOR) 10 MG capsule Take 1 capsule (10 mg total) by mouth at bedtime.    omeprazole (PRILOSEC) 40 MG capsule Take 40 mg by mouth every morning.   potassium chloride SA (K-DUR,KLOR-CON) 20 MEQ tablet Take 20 mEq by mouth daily.  propranolol (INDERAL) 40 MG tablet Take 1 tablet (40 mg total) by mouth 2 (two) times daily.  sulindac (CLINORIL) 200 MG tablet Take 200 mg by mouth 2 (two) times daily.  temazepam (RESTORIL) 15 MG capsule 2  qhs  tiZANidine (ZANAFLEX) 4 MG tablet Take 0.5 tablets (2 mg total) by mouth every 6 (six) hours as needed for muscle spasms (as directed 2mg  at bedtime for 1 week, then 2mg  twice daily for 1 week, then 2mg  three times daily.). 2mg  at bedtime for 1 week, then 2mg  twice daily for 1 week, then 2mg  three times daily.  triamterene-hydrochlorothiazide (MAXZIDE-25) 37.5-25 MG per tablet Take 1 tablet by mouth daily.  valACYclovir (VALTREX) 1000 MG tablet Take 1,000 mg by mouth daily.  vitamin B-12 (CYANOCOBALAMIN) 1000 MCG tablet Take 1,000 mcg by mouth daily.  buPROPion (WELLBUTRIN XL) 150 MG 24 hr tablet 2 qam Patient not taking: Reported on 02/21/2014  chlorzoxazone (PARAFON) 500 MG tablet Take 500 mg by mouth 4 (four) times daily as needed (migraine).  diazepam (VALIUM) 5 MG tablet Take 1 tablet (5 mg total) by mouth every 6 (six) hours as needed for anxiety. Patient not taking: Reported on 06/21/2014  oxyCODONE-acetaminophen (PERCOCET) 5-325 MG per tablet Take 1 tablet by mouth every 6 (six) hours as needed. Patient not taking: Reported on 02/21/2014  oxyCODONE-acetaminophen (PERCOCET/ROXICET) 5-325 MG per tablet Take 1 tablet by mouth every 6 (six) hours as needed for severe pain. Patient not taking: Reported on 02/21/2014  PRESCRIPTION MEDICATION Inject 1 each into the skin once. Cortisone injection in back.  propranolol (INDERAL) 20 MG tablet Take 1 tablet (20 mg total) by mouth 2 (two) times daily. Patient not taking: Reported on 04/16/2014  topiramate (TOPAMAX) 25 MG capsule Take 25 mg by mouth 2 (two) times daily.  valACYclovir  (VALTREX) 500 MG tablet Take 500 mg by mouth 2 (two) times daily as needed (virus flare).  zonisamide (ZONEGRAN) 100 MG capsule Take 400 mg by mouth at bedtime.      History   Social History  . Marital Status: Single    Spouse Name: N/A  . Number of Children: N/A  . Years of Education: N/A   Occupational History  . Not on file.   Social History Main Topics  . Smoking status: Current Some Day Smoker -- 0.25 packs/day for 30 years    Types: Cigarettes  . Smokeless tobacco: Never Used  . Alcohol Use: No  . Drug Use: No  . Sexual Activity: No   Other Topics Concern  . Not on file   Social History Narrative    Family Status  Relation Status Death Age  . Father Deceased   .  Mother Deceased   . Brother Deceased   . Brother Alive   . Brother Alive    Family History  Problem Relation Age of Onset  . Coronary artery disease Father   . Hypertension Mother   . Schizophrenia Mother   . Depression Brother   . Prostate cancer Brother   . Anesthesia problems Neg Hx   . Hypotension Neg Hx   . Malignant hyperthermia Neg Hx   . Pseudochol deficiency Neg Hx   . Cancer Father     head neck      ROS:  Full 14 point review of systems complete and found to be negative unless listed above.  Physical Exam: Blood pressure 154/109, pulse 95, temperature 98.1 F (36.7 C), temperature source Oral, resp. rate 25, height 5\' 6"  (1.676 m), weight 205 lb (92.987 kg), SpO2 96 %.  General: Well developed, well nourished, female in no acute distress Head: Eyes PERRLA, No xanthomas.   Normocephalic and atraumatic, oropharynx without edema or exudate. Dentition: good Lungs: CTA bilaterally Heart: HRRR S1 S2, no rub/gallop, Heart irregular rate and rhythm with S1, S2  murmur. pulses are 2+ all 4 extrem.   Neck: No carotid bruits. No lymphadenopathy.  JVD not elevated. Abdomen: Bowel sounds present, abdomen soft and non-tender without masses or hernias noted. Msk:  No spine or cva tenderness. No  weakness, no joint deformities or effusions. Extremities: No clubbing or cyanosis. No edema.  Neuro: Alert and oriented X 3. No focal deficits noted. Psych:  Good affect, responds appropriately Skin: No rashes or lesions noted.  Labs:   Lab Results  Component Value Date   WBC 7.5 06/21/2014   HGB 15.6* 06/21/2014   HCT 46.0 06/21/2014   MCV 90.4 06/21/2014   PLT 387 06/21/2014   No results for input(s): INR in the last 72 hours.   Recent Labs Lab 06/21/14 1227  NA 141  K 3.7  CL 100  CO2 28  BUN 11  CREATININE 0.81  CALCIUM 10.2  GLUCOSE 103*    Recent Labs  06/21/14 1232  TROPIPOC 0.00   B NATRIURETIC PEPTIDE  Date/Time Value Ref Range Status  06/21/2014 12:27 PM 27.9 0.0 - 100.0 pg/mL Final   Lab Results  Component Value Date   CHOL 229* 05/24/2007   HDL 47 05/24/2007   LDLCALC 159* 05/24/2007   TRIG 114 05/24/2007   ECG:  06/21/2014 ST, no acute ischemic changes  Radiology:  No results found.  ASSESSMENT AND PLAN:   The patient was seen today by Dr. Mare Ferrari, the patient evaluated and the data reviewed.  Active Problems:   SOB (shortness of breath) - ck CXR, lung exam is OK    HTN (hypertension) - per IM    Precordial pain - cycle enzymes  - ck echo -decide on further eval.  Signed: Rosaria Ferries, PA-C 06/21/2014 3:21 PM Beeper [phone]  Co-Sign MD The patient was seen in the emergency room.  Currently she is in no acute distress.  She has a past history of labile hypertension.  Her blood pressure has been worse recently which she attributes to the side effect from her myrbetriq. Her physical examination is unremarkable.  Her lungs are clear without rales.  The heart reveals no gallop. We will obtain an echocardiogram to evaluate left ventricular function.  Cardiac enzymes will be cycled although she has not had any significant chest discomfort.  Of note is the fact that her pain natruretic peptide is not elevated. If echocardiogram  shows  significant left ventricular dysfunction or wall motion abnormalities, further ischemic evaluation will be indicated.

## 2014-06-22 ENCOUNTER — Other Ambulatory Visit: Payer: Self-pay | Admitting: *Deleted

## 2014-06-22 DIAGNOSIS — R072 Precordial pain: Secondary | ICD-10-CM

## 2014-06-22 DIAGNOSIS — I10 Essential (primary) hypertension: Secondary | ICD-10-CM | POA: Diagnosis not present

## 2014-06-22 DIAGNOSIS — R0600 Dyspnea, unspecified: Secondary | ICD-10-CM | POA: Diagnosis not present

## 2014-06-22 DIAGNOSIS — E785 Hyperlipidemia, unspecified: Secondary | ICD-10-CM | POA: Diagnosis not present

## 2014-06-22 DIAGNOSIS — K219 Gastro-esophageal reflux disease without esophagitis: Secondary | ICD-10-CM | POA: Diagnosis not present

## 2014-06-22 DIAGNOSIS — G43009 Migraine without aura, not intractable, without status migrainosus: Secondary | ICD-10-CM

## 2014-06-22 DIAGNOSIS — R0602 Shortness of breath: Secondary | ICD-10-CM | POA: Diagnosis not present

## 2014-06-22 DIAGNOSIS — F323 Major depressive disorder, single episode, severe with psychotic features: Secondary | ICD-10-CM | POA: Diagnosis not present

## 2014-06-22 DIAGNOSIS — R0789 Other chest pain: Secondary | ICD-10-CM | POA: Diagnosis not present

## 2014-06-22 LAB — BASIC METABOLIC PANEL
ANION GAP: 11 (ref 5–15)
BUN: 17 mg/dL (ref 6–23)
CALCIUM: 9.1 mg/dL (ref 8.4–10.5)
CO2: 30 mmol/L (ref 19–32)
Chloride: 100 mmol/L (ref 96–112)
Creatinine, Ser: 0.84 mg/dL (ref 0.50–1.10)
GFR calc Af Amer: 88 mL/min — ABNORMAL LOW (ref >90)
GFR calc non Af Amer: 76 mL/min — ABNORMAL LOW (ref >90)
Glucose, Bld: 103 mg/dL — ABNORMAL HIGH (ref 70–99)
POTASSIUM: 3.5 mmol/L (ref 3.5–5.1)
Sodium: 141 mmol/L (ref 135–145)

## 2014-06-22 LAB — CBC
HEMATOCRIT: 42.7 % (ref 36.0–46.0)
Hemoglobin: 13.8 g/dL (ref 12.0–15.0)
MCH: 29.7 pg (ref 26.0–34.0)
MCHC: 32.3 g/dL (ref 30.0–36.0)
MCV: 92 fL (ref 78.0–100.0)
PLATELETS: 386 10*3/uL (ref 150–400)
RBC: 4.64 MIL/uL (ref 3.87–5.11)
RDW: 14.2 % (ref 11.5–15.5)
WBC: 7.5 10*3/uL (ref 4.0–10.5)

## 2014-06-22 LAB — TROPONIN I: Troponin I: <0.03 ng/mL (ref <0.031)

## 2014-06-22 MED ORDER — PROPRANOLOL HCL ER 80 MG PO CP24: 80.0000 mg | ORAL_CAPSULE | Freq: Every day | ORAL | Status: DC

## 2014-06-22 MED ORDER — HYDRALAZINE HCL 20 MG/ML IJ SOLN: 10.0000 mg | Freq: Three times a day (TID) | INTRAMUSCULAR | Status: DC | PRN

## 2014-06-22 MED ORDER — PROPRANOLOL HCL 40 MG PO TABS: 40.0000 mg | ORAL_TABLET | Freq: Two times a day (BID) | ORAL | Status: DC

## 2014-06-22 MED ORDER — NICOTINE 7 MG/24HR TD PT24
7.0000 mg | MEDICATED_PATCH | Freq: Every day | TRANSDERMAL | Status: DC
  Administered 2014-06-22 – 2014-06-24 (×3): 7 mg via TRANSDERMAL
  Filled 2014-06-22 (×3): qty 1

## 2014-06-22 NOTE — Progress Notes (Signed)
PROGRESS NOTE  Gloria Lewis JTT:[id number] DOB: Feb 04, 1956 DOA: 06/21/2014 PCP: Vidal Schwalbe, MD  Assessment/Plan: atypical chest pain -Relieved by belching -No chest pain presently -EKG without any ST-T wave changes -Troponins negative 3 -Appreciate cardiology consultation--> further workup only if echocardiogram shows significant LV dysfunction or wall motion abnormalities -Echocardiogram Dyspnea -Intermittent -No hypoxemia or respiratory distress, no tachycardia -Presently 100% on room air  -Chest x-ray negative -Likely due to allergic rhinitis and a component of anxiety Depressive type psychosis and anxiety: -Stable  -Continue home medications  Hypertension  -Restart Dyazide and propranolol  -Fairly well controlled presently  GERD  -ContinueCarafate  Hyperlipidemia  -Continue statin    Family Communication:   Pt at beside Disposition Plan:   Home when medically stable       Procedures/Studies: Dg Chest 2 View  06/21/2014   CLINICAL DATA:  Shortness of breath and cough for 3 days  EXAM: CHEST  2 VIEW  COMPARISON:  12/21/2013  FINDINGS: Cardiac shadow is stable. Some bibasilar scarring is again seen. No focal infiltrate or sizable effusion is noted. Mild degenerative changes of the thoracic spine are seen.  IMPRESSION: Bibasilar scarring, no acute abnormality noted.   Electronically Signed   By: Inez Catalina M.D.   On: 06/21/2014 17:44         Subjective: Pt denies any cp.  Has intermitten sob exacerbated by sitting up, better lying down.  No n/v/d, /f/c  Objective: Filed Vitals:   06/21/14 2228 06/22/14 0556 06/22/14 0908 06/22/14 1358  BP:  126/74 122/83 148/85  Pulse:  69 77 66  Temp:  97.8 F (36.6 C) 98.1 F (36.7 C) 98.1 F (36.7 C)  TempSrc:  Oral Oral Oral  Resp: 16 20  18   Height: 5\' 6"  (1.676 m)     Weight: 92.352 kg (203 lb 9.6 oz)     SpO2: 99% 97%  95%    Intake/Output Summary (Last 24 hours) at 06/22/14  1850 Last data filed at 06/22/14 1557  Gross per 24 hour  Intake    720 ml  Output      0 ml  Net    720 ml   Weight change:  Exam:   General:  Pt is alert, follows commands appropriately, not in acute distress  HEENT: No icterus, No thrush,  [place]/AT  Cardiovascular: RRR, S1/S2, no rubs, no gallops  Respiratory: CTA bilaterally, no wheezing, no crackles, no rhonchi  Abdomen: Soft/+BS, non tender, non distended, no guarding  Extremities: No edema, No lymphangitis, No petechiae, No rashes, no synovitis  Data Reviewed: Basic Metabolic Panel:  Recent Labs Lab 06/21/14 1227 06/22/14 0511  NA 141 141  K 3.7 3.5  CL 100 100  CO2 28 30  GLUCOSE 103* 103*  BUN 11 17  CREATININE 0.81 0.84  CALCIUM 10.2 9.1   Liver Function Tests: No results for input(s): AST, ALT, ALKPHOS, BILITOT, PROT, ALBUMIN in the last 168 hours. No results for input(s): LIPASE, AMYLASE in the last 168 hours. No results for input(s): AMMONIA in the last 168 hours. CBC:  Recent Labs Lab 06/21/14 1227 06/22/14 0511  WBC 7.5 7.5  HGB 15.6* 13.8  HCT 46.0 42.7  MCV 90.4 92.0  PLT 387 386   Cardiac Enzymes:  Recent Labs Lab 06/22/14 0257 06/22/14 0511  TROPONINI <0.03 <0.03   BNP: Invalid input(s): POCBNP CBG: No results for input(s): GLUCAP in the last 168 hours.  No results  found for this or any previous visit (from the past 240 hour(s)).   Scheduled Meds: . aspirin EC  81 mg Oral Daily  . atorvastatin  10 mg Oral q1800  . diazepam  10 mg Oral BID  . FLUoxetine  40 mg Oral Daily  . fluticasone  2 spray Each Nare Daily  . folic acid  1 mg Oral Daily  . heparin  5,000 Units Subcutaneous 3 times per day  . Levomilnacipran HCl ER  40 mg Oral Daily  . Linaclotide  290 mcg Oral Daily  . loratadine  10 mg Oral Daily  . multivitamin with minerals  1 tablet Oral q morning - 10a  . nicotine  7 mg Transdermal Daily  . nortriptyline  10 mg Oral QHS  . pantoprazole  40 mg Oral BID  .  potassium chloride SA  20 mEq Oral Daily  . propranolol  40 mg Oral BID  . sucralfate  1 g Oral TID WC & HS  . topiramate  25 mg Oral BID  . triamterene-hydrochlorothiazide  1 tablet Oral Daily  . vitamin B-12  1,000 mcg Oral Daily  . zonisamide  400 mg Oral QHS   Continuous Infusions: . sodium chloride 20 mL/hr at 06/21/14 1346     Marinna Blane, DO  [facility] Pager [phone]  If 7PM-7AM, please contact night-coverage [email or web address] Password [email or web address] 06/22/2014, 6:50 PM

## 2014-06-22 NOTE — Progress Notes (Signed)
UR completed 

## 2014-06-22 NOTE — Progress Notes (Signed)
  Echocardiogram 2D Echocardiogram has been performed.  Darlina Sicilian M 06/22/2014, 1:55 PM

## 2014-06-22 NOTE — Discharge Summary (Addendum)
Physician Discharge Summary  Gloria Lewis VFI:[id number] DOB: [birth year] DOA: 06/21/2014  PCP: Vidal Schwalbe, MD  Admit date: 06/21/2014 Discharge date: 06/24/14 Recommendations for Outpatient Follow-up:  1. Pt will need to follow up with PCP in 1 week post discharge 2. Please obtain BMP and CBC in 1 week  Discharge Diagnoses:  atypical chest pain -Relieved by belching -No chest pain presently -EKG without any ST-T wave changes -Troponins negative 3 -Appreciate cardiology consultation--> recommended lexiscan -06/24/14--Lexiscan-no reversible ischemia, EF 57% -Echocardiogram-EF50-55%, no WMA, mod TR Dyspnea -Intermittent -No hypoxemia or respiratory distress, no tachycardia -Presently 100% on room air  -Chest x-ray negative -Likely due to allergic rhinitis and a component of anxiety Depressive type psychosis and anxiety: -Stable  -Continue home medications  Hypertension  -Restart propranolol  -well controlled presently--will not restart triamterene HCTZ as BP became soft during the hospitalization and serum creatinine slightly rose -f/u PCP in one week for BMP and BP check -d/c potassium supplement -d/c NSAIDS GERD  -Continue Carafate  -Patient did not want to restart PPI. She felt that the Carafate worked better for her. -Rx for Carafate after discharge. Hyperlipidemia  -Continue statin   Discharge Condition: Stable   Disposition:  Follow-up Information    Follow up with Vidal Schwalbe, MD In 1 week.   Specialty:  Family Medicine   Contact information:   [place] [address] [phone]     Home   Diet: Heart healthy  Wt Readings from Last 3 Encounters:  06/21/14 92.352 kg (203 lb 9.6 oz)  04/16/14 93.849 kg (206 lb 14.4 oz)  04/13/14 92.443 kg (203 lb 12.8 oz)    History of present illness:  59 year old female with a history of hypertension, GERD, hyperlipidemia, bipolar disorder, anxiety, psychosis presented with  two-day history of worsening chest discomfort and shortness of breath. The SOB is better when lying flat, worsened by sitting up, exertion. The patient denied any PND or orthopnea. On the day prior to admission, she took her blood pressure and it was elevated. Therefore she went to her primary care provider who started her on Dyazide and propranolol. In the emergency department, the patient complained of some chest discomfort which was relieved by belching.  Cardiology was consulted. EKG was negative for any ischemic changes. Troponins were negative 3. Cardiology recommended an echocardiogram. They recommended a NM Lexiscan which was performed on 06/24/14.  the echocardiogram did not show any LV dysfunction or wall motion abnormalities. Echocardiogram showed EF 50-55%, no wall motion abnormalities.  Consultants: Cardiology  Discharge Exam: Filed Vitals:   06/24/14 1329  BP: 104/68  Pulse: 76  Temp: 97.7 F (36.5 C)  Resp: 20   Filed Vitals:   06/24/14 1145 06/24/14 1148 06/24/14 1149 06/24/14 1329  BP: 90/62 95/62 97/64  104/68  Pulse: 73 80 78 76  Temp:    97.7 F (36.5 C)  TempSrc:    Oral  Resp:    20  Height:      Weight:      SpO2:    99%   General: A&O x 3, NAD, pleasant, cooperative Cardiovascular: RRR, no rub, no gallop, no S3 Respiratory: CTAB, no wheeze, no rhonchi Abdomen:soft, nontender, nondistended, positive bowel sounds Extremities: No edema, No lymphangitis, no petechiae  Discharge Instructions      Discharge Instructions    Diet - low sodium heart healthy    Complete by:  As directed      Increase activity slowly    Complete  by:  As directed             Medication List    STOP taking these medications        buPROPion 150 MG 24 hr tablet  Commonly known as:  WELLBUTRIN XL     chlorzoxazone 500 MG tablet  Commonly known as:  PARAFON     cyclobenzaprine 10 MG tablet  Commonly known as:  FLEXERIL     omeprazole 40 MG capsule  Commonly known as:   PRILOSEC     potassium chloride SA 20 MEQ tablet  Commonly known as:  K-DUR,KLOR-CON     sulindac 200 MG tablet  Commonly known as:  CLINORIL     temazepam 15 MG capsule  Commonly known as:  RESTORIL     triamterene-hydrochlorothiazide 37.5-25 MG per tablet  Commonly known as:  MAXZIDE-25      TAKE these medications        aspirin 81 MG EC tablet  Take 1 tablet (81 mg total) by mouth daily.     atorvastatin 10 MG tablet  Commonly known as:  LIPITOR  Take 10 mg by mouth daily.     diazepam 5 MG tablet  Commonly known as:  VALIUM  Take 2 tablets (10 mg total) by mouth 2 (two) times daily.     FLUoxetine 40 MG capsule  Commonly known as:  PROZAC  Take 40 mg by mouth daily.     fluticasone 50 MCG/ACT nasal spray  Commonly known as:  FLONASE  Place 2 sprays into both nostrils daily.     folic acid 1 MG tablet  Commonly known as:  FOLVITE  Take 1 mg by mouth daily.     Levomilnacipran HCl ER 40 MG Cp24  Take 40 mg by mouth daily.     Linaclotide 290 MCG Caps capsule  Commonly known as:  LINZESS  Take 290 mcg by mouth daily.     Loratadine 10 MG Caps  Take 10 mg by mouth daily.     mesalamine 1000 MG suppository  Commonly known as:  CANASA  Place 1,000 mg rectally daily as needed (severe constipation.).     mirabegron ER 50 MG Tb24 tablet  Commonly known as:  MYRBETRIQ  Take 50 mg by mouth every other day.     multivitamin with minerals tablet  Take 1 tablet by mouth every morning.     nortriptyline 10 MG capsule  Commonly known as:  PAMELOR  Take 1 capsule (10 mg total) by mouth at bedtime.     oxyCODONE-acetaminophen 5-325 MG per tablet  Commonly known as:  PERCOCET  Take 1 tablet by mouth every 6 (six) hours as needed.     PRESCRIPTION MEDICATION  Inject 1 each into the skin once. Cortisone injection in back.     propranolol 40 MG tablet  Commonly known as:  INDERAL  Take 1 tablet (40 mg total) by mouth 2 (two) times daily.     sucralfate 1 G  tablet  Commonly known as:  CARAFATE  Take 1 tablet (1 g total) by mouth 3 (three) times daily with meals.     tiZANidine 4 MG tablet  Commonly known as:  ZANAFLEX  Take 0.5 tablets (2 mg total) by mouth every 6 (six) hours as needed for muscle spasms (as directed 2mg  at bedtime for 1 week, then 2mg  twice daily for 1 week, then 2mg  three times daily.). 2mg  at bedtime for 1 week, then 2mg  twice daily for  1 week, then 2mg  three times daily.     topiramate 25 MG capsule  Commonly known as:  TOPAMAX  Take 25 mg by mouth 2 (two) times daily.     valACYclovir 1000 MG tablet  Commonly known as:  VALTREX  Take 1,000 mg by mouth daily.     vitamin B-12 1000 MCG tablet  Commonly known as:  CYANOCOBALAMIN  Take 1,000 mcg by mouth daily.     zonisamide 100 MG capsule  Commonly known as:  ZONEGRAN  Take 400 mg by mouth at bedtime.         The results of significant diagnostics from this hospitalization (including imaging, microbiology, ancillary and laboratory) are listed below for reference.    Significant Diagnostic Studies: Dg Chest 2 View  06/21/2014   CLINICAL DATA:  Shortness of breath and cough for 3 days  EXAM: CHEST  2 VIEW  COMPARISON:  12/21/2013  FINDINGS: Cardiac shadow is stable. Some bibasilar scarring is again seen. No focal infiltrate or sizable effusion is noted. Mild degenerative changes of the thoracic spine are seen.  IMPRESSION: Bibasilar scarring, no acute abnormality noted.   Electronically Signed   By: Inez Catalina M.D.   On: 06/21/2014 17:44   Nm Myocar Multi W/spect W/wall Motion / Ef  06/24/2014   CLINICAL DATA:  59 year old female  EXAM: MYOCARDIAL IMAGING WITH SPECT (REST AND PHARMACOLOGIC-STRESS)  GATED LEFT VENTRICULAR WALL MOTION STUDY  LEFT VENTRICULAR EJECTION FRACTION  TECHNIQUE: Standard myocardial SPECT imaging was performed after resting intravenous injection of 10 mCi Tc-31m sestamibi. Subsequently, intravenous infusion of Lexiscan was performed under the  supervision of the Cardiology staff. At peak effect of the drug, 30 mCi Tc-57m sestamibi was injected intravenously and standard myocardial SPECT imaging was performed. Quantitative gated imaging was also performed to evaluate left ventricular wall motion, and estimate left ventricular ejection fraction.  COMPARISON:  None.  FINDINGS: Perfusion: No decreased activity in the left ventricle on stress imaging to suggest reversible ischemia or infarction.  Wall Motion: Normal left ventricular wall motion. No left ventricular dilation.  Left Ventricular Ejection Fraction: 57 %  End diastolic volume 60 ml  End systolic volume 26 ml  IMPRESSION: 1. No reversible ischemia or infarction.  2. Normal left ventricular wall motion.  3. Left ventricular ejection fraction 57%  4. Low-risk stress test findings*.  *2012 Appropriate Use Criteria for Coronary Revascularization Focused Update: J Am Coll Cardiol. 7353;29(9):242-683. http://content.airportbarriers.com.aspx?articleid=1201161   Electronically Signed   By: Suzy Bouchard M.D.   On: 06/24/2014 14:56     Microbiology: No results found for this or any previous visit (from the past 240 hour(s)).   Labs: Basic Metabolic Panel:  Recent Labs Lab 06/21/14 1227 06/22/14 0511 06/24/14 0847  NA 141 141 138  K 3.7 3.5 4.0  CL 100 100 101  CO2 28 30 28   GLUCOSE 103* 103* 107*  BUN 11 17 29*  CREATININE 0.81 0.84 1.25*  CALCIUM 10.2 9.1 9.8   Liver Function Tests: No results for input(s): AST, ALT, ALKPHOS, BILITOT, PROT, ALBUMIN in the last 168 hours. No results for input(s): LIPASE, AMYLASE in the last 168 hours. No results for input(s): AMMONIA in the last 168 hours. CBC:  Recent Labs Lab 06/21/14 1227 06/22/14 0511  WBC 7.5 7.5  HGB 15.6* 13.8  HCT 46.0 42.7  MCV 90.4 92.0  PLT 387 386   Cardiac Enzymes:  Recent Labs Lab 06/22/14 0257 06/22/14 0511  TROPONINI <0.03 <0.03   BNP: Invalid  input(s): POCBNP CBG: No results for  input(s): GLUCAP in the last 168 hours.  Time coordinating discharge:  Greater than 30 minutes  Signed:  Ersie Savino, DO [facility] Pager: [phone] 06/24/2014, 3:25 PM

## 2014-06-22 NOTE — Progress Notes (Signed)
   SUBJECTIVE:  No further CP or SOB  OBJECTIVE:   Vitals:   Filed Vitals:   06/21/14 1814 06/21/14 2145 06/21/14 2228 06/22/14 0556  BP: 124/86 133/97 137/86 126/74  Pulse: 81 80 79 69  Temp: 97.9 F (36.6 C) 97.8 F (36.6 C) 97.7 F (36.5 C) 97.8 F (36.6 C)  TempSrc: Oral Oral Oral Oral  Resp: 18 18 16 20   Height:   5\' 6"  (1.676 m)   Weight:   203 lb 9.6 oz (92.352 kg)   SpO2: 99% 98% 99% 97%   I&O's:  No intake or output data in the 24 hours ending 06/22/14 0815 TELEMETRY: Reviewed telemetry pt in NSR:     PHYSICAL EXAM General: Well developed, well nourished, in no acute distress Head: Eyes PERRLA, No xanthomas.   Normal cephalic and atramatic  Lungs:   Clear bilaterally to auscultation and percussion. Heart:   HRRR S1 S2 Pulses are 2+ & equal. Abdomen: Bowel sounds are positive, abdomen soft and non-tender without masses Extremities:   No clubbing, cyanosis or edema.  DP +1 Neuro: Alert and oriented X 3. Psych:  Good affect, responds appropriately   LABS: Basic Metabolic Panel:  Recent Labs  06/21/14 1227 06/22/14 0511  NA 141 141  K 3.7 3.5  CL 100 100  CO2 28 30  GLUCOSE 103* 103*  BUN 11 17  CREATININE 0.81 0.84  CALCIUM 10.2 9.1   Liver Function Tests: No results for input(s): AST, ALT, ALKPHOS, BILITOT, PROT, ALBUMIN in the last 72 hours. No results for input(s): LIPASE, AMYLASE in the last 72 hours. CBC:  Recent Labs  06/21/14 1227 06/22/14 0511  WBC 7.5 7.5  HGB 15.6* 13.8  HCT 46.0 42.7  MCV 90.4 92.0  PLT 387 386   Cardiac Enzymes:  Recent Labs  06/22/14 0257 06/22/14 0511  TROPONINI <0.03 <0.03   BNP: Invalid input(s): POCBNP D-Dimer: No results for input(s): DDIMER in the last 72 hours. Hemoglobin A1C: No results for input(s): HGBA1C in the last 72 hours. Fasting Lipid Panel: No results for input(s): CHOL, HDL, LDLCALC, TRIG, CHOLHDL, LDLDIRECT in the last 72 hours. Thyroid Function Tests: No results for input(s):  TSH, T4TOTAL, T3FREE, THYROIDAB in the last 72 hours.  Invalid input(s): FREET3 Anemia Panel: No results for input(s): VITAMINB12, FOLATE, FERRITIN, TIBC, IRON, RETICCTPCT in the last 72 hours. Coag Panel:   Lab Results  Component Value Date   INR 0.93 06/21/2014   INR 0.94 08/25/2011   INR 0.95 09/11/2010    RADIOLOGY: Dg Chest 2 View  06/21/2014   CLINICAL DATA:  Shortness of breath and cough for 3 days  EXAM: CHEST  2 VIEW  COMPARISON:  12/21/2013  FINDINGS: Cardiac shadow is stable. Some bibasilar scarring is again seen. No focal infiltrate or sizable effusion is noted. Mild degenerative changes of the thoracic spine are seen.  IMPRESSION: Bibasilar scarring, no acute abnormality noted.   Electronically Signed   By: Inez Catalina M.D.   On: 06/21/2014 17:44     ASSESSMENT AND PLAN:   1.  SOB (shortness of breath - BNP normal and chest xray clear.  May be due to labile HTN    2.  HTN (hypertension) - controlled    3.  Chest pain - cardiac enzymes negative x 3.  Will check 2D echo.  Further w/u pending results of echo   Sueanne Margarita, MD  06/22/2014  8:15 AM

## 2014-06-23 DIAGNOSIS — F323 Major depressive disorder, single episode, severe with psychotic features: Secondary | ICD-10-CM | POA: Diagnosis not present

## 2014-06-23 DIAGNOSIS — R0789 Other chest pain: Secondary | ICD-10-CM | POA: Diagnosis not present

## 2014-06-23 DIAGNOSIS — K219 Gastro-esophageal reflux disease without esophagitis: Secondary | ICD-10-CM | POA: Diagnosis not present

## 2014-06-23 DIAGNOSIS — R072 Precordial pain: Secondary | ICD-10-CM | POA: Diagnosis not present

## 2014-06-23 DIAGNOSIS — E785 Hyperlipidemia, unspecified: Secondary | ICD-10-CM | POA: Diagnosis not present

## 2014-06-23 DIAGNOSIS — R0600 Dyspnea, unspecified: Secondary | ICD-10-CM | POA: Diagnosis not present

## 2014-06-23 DIAGNOSIS — R079 Chest pain, unspecified: Secondary | ICD-10-CM

## 2014-06-23 DIAGNOSIS — I10 Essential (primary) hypertension: Secondary | ICD-10-CM | POA: Diagnosis not present

## 2014-06-23 MED ORDER — HYDROCORTISONE 1 % EX CREA
TOPICAL_CREAM | CUTANEOUS | Status: DC | PRN
  Filled 2014-06-23: qty 28

## 2014-06-23 MED ORDER — NYSTATIN 100000 UNIT/GM EX POWD
Freq: Two times a day (BID) | CUTANEOUS | Status: DC
  Administered 2014-06-23 – 2014-06-24 (×2): via TOPICAL
  Filled 2014-06-23: qty 15

## 2014-06-23 MED ORDER — ASPIRIN 81 MG PO TBEC: 81.0000 mg | DELAYED_RELEASE_TABLET | Freq: Every day | ORAL | Status: DC

## 2014-06-23 NOTE — Progress Notes (Signed)
SUBJECTIVE:  Had a cramp in her chest last night that resolved with belching  OBJECTIVE:   Vitals:   Filed Vitals:   06/22/14 1358 06/22/14 2100 06/22/14 2358 06/23/14 0517  BP: 148/85 119/81 116/82 124/78  Pulse: 66 69 72 64  Temp: 98.1 F (36.7 C) 97.5 F (36.4 C) 97.5 F (36.4 C) 97.8 F (36.6 C)  TempSrc: Oral Oral Oral Oral  Resp: 18 18 18 18   Height:      Weight:      SpO2: 95% 97% 97% 99%   I&O's:   Intake/Output Summary (Last 24 hours) at 06/23/14 5885 Last data filed at 06/22/14 1557  Gross per 24 hour  Intake    720 ml  Output      0 ml  Net    720 ml   TELEMETRY: Reviewed telemetry pt in NSR:     PHYSICAL EXAM General: Well developed, well nourished, in no acute distress Head: Eyes PERRLA, No xanthomas.   Normal cephalic and atramatic  Lungs:   Clear bilaterally to auscultation and percussion. Heart:   HRRR S1 S2 Pulses are 2+ & equal. Abdomen: Bowel sounds are positive, abdomen soft and non-tender without masses  Extremities:   No clubbing, cyanosis or edema.  DP +1 Neuro: Alert and oriented X 3. Psych:  Good affect, responds appropriately   LABS: Basic Metabolic Panel:  Recent Labs  06/21/14 1227 06/22/14 0511  NA 141 141  K 3.7 3.5  CL 100 100  CO2 28 30  GLUCOSE 103* 103*  BUN 11 17  CREATININE 0.81 0.84  CALCIUM 10.2 9.1   Liver Function Tests: No results for input(s): AST, ALT, ALKPHOS, BILITOT, PROT, ALBUMIN in the last 72 hours. No results for input(s): LIPASE, AMYLASE in the last 72 hours. CBC:  Recent Labs  06/21/14 1227 06/22/14 0511  WBC 7.5 7.5  HGB 15.6* 13.8  HCT 46.0 42.7  MCV 90.4 92.0  PLT 387 386   Cardiac Enzymes:  Recent Labs  06/22/14 0257 06/22/14 0511  TROPONINI <0.03 <0.03   BNP: Invalid input(s): POCBNP D-Dimer: No results for input(s): DDIMER in the last 72 hours. Hemoglobin A1C: No results for input(s): HGBA1C in the last 72 hours. Fasting Lipid Panel: No results for input(s): CHOL,  HDL, LDLCALC, TRIG, CHOLHDL, LDLDIRECT in the last 72 hours. Thyroid Function Tests: No results for input(s): TSH, T4TOTAL, T3FREE, THYROIDAB in the last 72 hours.  Invalid input(s): FREET3 Anemia Panel: No results for input(s): VITAMINB12, FOLATE, FERRITIN, TIBC, IRON, RETICCTPCT in the last 72 hours. Coag Panel:   Lab Results  Component Value Date   INR 0.93 06/21/2014   INR 0.94 08/25/2011   INR 0.95 09/11/2010    RADIOLOGY: Dg Chest 2 View  06/21/2014   CLINICAL DATA:  Shortness of breath and cough for 3 days  EXAM: CHEST  2 VIEW  COMPARISON:  12/21/2013  FINDINGS: Cardiac shadow is stable. Some bibasilar scarring is again seen. No focal infiltrate or sizable effusion is noted. Mild degenerative changes of the thoracic spine are seen.  IMPRESSION: Bibasilar scarring, no acute abnormality noted.   Electronically Signed   By: Inez Catalina M.D.   On: 06/21/2014 17:44   ASSESSMENT AND PLAN:   1. SOB (shortness of breath - BNP normal and chest xray clear. May be due to labile HTN  2. HTN (hypertension) - controlled  3. Chest pain - cardiac enzymes negative x 3. 2D echo with normal LVF and grade I diastolic dysfunction.Marland Kitchen  Will make NPO for Lexiscan myoview today.  I suspect this is related to GERD since belching resolved her pain.      4/  GERD - continue protonix     Sueanne Margarita, MD  06/23/2014  8:21 AM

## 2014-06-23 NOTE — Progress Notes (Signed)
UR completed 

## 2014-06-23 NOTE — Progress Notes (Signed)
Patient c/o  Waking up with CP 10/10 radiates to her left back. Denied nausea, no diaphoresis noted. EKG done-NSR. CP eased off after few minutes without med intervention. Patient also belched twice which she claimed eased her CP. On call NP T Callahan paged and made aware about pt complaint. No new order received. Pt CP 5/10 at this time. Will give GI cocktail as ordered for CP or indigestion. Patient agreeable. Will monitor closely.   06/22/14 2358  Vitals  Temp 97.5 F (36.4 C)  Temp Source Oral  BP 116/82 mmHg  BP Location Right Arm  BP Method Automatic  Patient Position (if appropriate) Lying  Pulse Rate 72  Pulse Rate Source Dinamap  Resp 18  Oxygen Therapy  SpO2 97 %  O2 Device Room Air

## 2014-06-23 NOTE — Progress Notes (Signed)
Multiple attempt to reach [facility] since 8:45 to coordinate Myoview today. Unable to reach staff at [phone]. Called several number within the radiology dept. Finally able to speak with the on call person [phone]. They will not be able to do today. Pt to go over in the AM. [facility] pick up called for 8am on 3/20.Cardiology notified and aware of exam in AM

## 2014-06-23 NOTE — Progress Notes (Signed)
PROGRESS NOTE  Gloria Lewis W[facility]:[id number] DOB: [birth year] DOA: 06/21/2014 PCP: Vidal Schwalbe, MD  Assessment/Plan: atypical chest pain -Relieved by belching -No chest pain presently -EKG without any ST-T wave changes -Troponins negative 3 -Appreciate cardiology consultation--> lexiscan scheduled on 3/20 -Echocardiogram-EF50-55%, no WMA, mod TR Dyspnea -Intermittent -No hypoxemia or respiratory distress, no tachycardia -Presently 100% on room air  -Chest x-ray negative -Likely due to allergic rhinitis and a component of anxiety Depressive type psychosis and anxiety: -Stable  -Continue home medications  Hypertension  -Restart Dyazide and propranolol  -well controlled presently  GERD  -Continue Carafate  -restart omeprazole after d/c Hyperlipidemia  -Continue statin    Family Communication:   Pt at beside Disposition Plan:   Home 3/20 if lexiscan is neg      Procedures/Studies: Dg Chest 2 View  06/21/2014   CLINICAL DATA:  Shortness of breath and cough for 3 days  EXAM: CHEST  2 VIEW  COMPARISON:  12/21/2013  FINDINGS: Cardiac shadow is stable. Some bibasilar scarring is again seen. No focal infiltrate or sizable effusion is noted. Mild degenerative changes of the thoracic spine are seen.  IMPRESSION: Bibasilar scarring, no acute abnormality noted.   Electronically Signed   By: Inez Catalina M.D.   On: 06/21/2014 17:44         Subjective: Patient denies any shortness of breath or continues to have vitamin chest pain. Denies any fevers, chills, cough, hemoptysis, vomiting, diarrhea, abdominal pain  Objective: Filed Vitals:   06/22/14 2100 06/22/14 2358 06/23/14 0517 06/23/14 1349  BP: 119/81 116/82 124/78 121/88  Pulse: 69 72 64 74  Temp: 97.5 F (36.4 C) 97.5 F (36.4 C) 97.8 F (36.6 C) 98.2 F (36.8 C)  TempSrc: Oral Oral Oral Oral  Resp: 18 18 18 18   Height:      Weight:      SpO2: 97% 97% 99% 98%    Intake/Output Summary  (Last 24 hours) at 06/23/14 1703 Last data filed at 06/23/14 0849  Gross per 24 hour  Intake      0 ml  Output      0 ml  Net      0 ml   Weight change:  Exam:   General:  Pt is alert, follows commands appropriately, not in acute distress  HEENT: No icterus, No thrush, No neck mass, [place]/AT  Cardiovascular: RRR, S1/S2, no rubs, no gallops  Respiratory: CTA bilaterally, no wheezing, no crackles, no rhonchi  Abdomen: Soft/+BS, non tender, non distended, no guarding  Extremities: No edema, No lymphangitis, No petechiae, No rashes, no synovitis  Data Reviewed: Basic Metabolic Panel:  Recent Labs Lab 06/21/14 1227 06/22/14 0511  NA 141 141  K 3.7 3.5  CL 100 100  CO2 28 30  GLUCOSE 103* 103*  BUN 11 17  CREATININE 0.81 0.84  CALCIUM 10.2 9.1   Liver Function Tests: No results for input(s): AST, ALT, ALKPHOS, BILITOT, PROT, ALBUMIN in the last 168 hours. No results for input(s): LIPASE, AMYLASE in the last 168 hours. No results for input(s): AMMONIA in the last 168 hours. CBC:  Recent Labs Lab 06/21/14 1227 06/22/14 0511  WBC 7.5 7.5  HGB 15.6* 13.8  HCT 46.0 42.7  MCV 90.4 92.0  PLT 387 386   Cardiac Enzymes:  Recent Labs Lab 06/22/14 0257 06/22/14 0511  TROPONINI <0.03 <0.03   BNP: Invalid input(s): POCBNP CBG: No results for input(s): GLUCAP in the last  168 hours.  No results found for this or any previous visit (from the past 240 hour(s)).   Scheduled Meds: . aspirin EC  81 mg Oral Daily  . atorvastatin  10 mg Oral q1800  . diazepam  10 mg Oral BID  . FLUoxetine  40 mg Oral Daily  . fluticasone  2 spray Each Nare Daily  . folic acid  1 mg Oral Daily  . heparin  5,000 Units Subcutaneous 3 times per day  . Levomilnacipran HCl ER  40 mg Oral Daily  . Linaclotide  290 mcg Oral Daily  . loratadine  10 mg Oral Daily  . multivitamin with minerals  1 tablet Oral q morning - 10a  . nicotine  7 mg Transdermal Daily  . nortriptyline  10 mg Oral QHS    . pantoprazole  40 mg Oral BID  . potassium chloride SA  20 mEq Oral Daily  . propranolol  40 mg Oral BID  . sucralfate  1 g Oral TID WC & HS  . topiramate  25 mg Oral BID  . triamterene-hydrochlorothiazide  1 tablet Oral Daily  . vitamin B-12  1,000 mcg Oral Daily  . zonisamide  400 mg Oral QHS   Continuous Infusions: . sodium chloride 20 mL/hr at 06/21/14 1346     Johnjoseph Rolfe, DO  [facility] Pager [phone]  If 7PM-7AM, please contact night-coverage [email or web address] Password [facility] 06/23/2014, 5:03 PM

## 2014-06-24 ENCOUNTER — Observation Stay (HOSPITAL_COMMUNITY)
Admit: 2014-06-24 | Discharge: 2014-06-24 | Disposition: A | Discharge status: Home / Self Care | Payer: Medicaid Other | Attending: Cardiology | Admitting: Cardiology

## 2014-06-24 ENCOUNTER — Ambulatory Visit (HOSPITAL_COMMUNITY)
Admit: 2014-06-24 | Discharge: 2014-06-24 | Disposition: A | Discharge status: Home / Self Care | Payer: Medicaid Other | Attending: Cardiology | Admitting: Cardiology

## 2014-06-24 ENCOUNTER — Other Ambulatory Visit: Payer: Self-pay

## 2014-06-24 DIAGNOSIS — R079 Chest pain, unspecified: Secondary | ICD-10-CM

## 2014-06-24 DIAGNOSIS — I10 Essential (primary) hypertension: Secondary | ICD-10-CM | POA: Diagnosis not present

## 2014-06-24 DIAGNOSIS — F323 Major depressive disorder, single episode, severe with psychotic features: Secondary | ICD-10-CM | POA: Diagnosis not present

## 2014-06-24 DIAGNOSIS — R072 Precordial pain: Secondary | ICD-10-CM | POA: Diagnosis not present

## 2014-06-24 DIAGNOSIS — K219 Gastro-esophageal reflux disease without esophagitis: Secondary | ICD-10-CM | POA: Diagnosis not present

## 2014-06-24 LAB — BASIC METABOLIC PANEL
Anion gap: 9 (ref 5–15)
BUN: 29 mg/dL — AB (ref 6–23)
CHLORIDE: 101 mmol/L (ref 96–112)
CO2: 28 mmol/L (ref 19–32)
CREATININE: 1.25 mg/dL — AB (ref 0.50–1.10)
Calcium: 9.8 mg/dL (ref 8.4–10.5)
GFR calc non Af Amer: 47 mL/min — ABNORMAL LOW (ref >90)
GFR, EST AFRICAN AMERICAN: 54 mL/min — AB (ref >90)
Glucose, Bld: 107 mg/dL — ABNORMAL HIGH (ref 70–99)
Potassium: 4 mmol/L (ref 3.5–5.1)
SODIUM: 138 mmol/L (ref 135–145)

## 2014-06-24 MED ORDER — DIAZEPAM 5 MG PO TABS
ORAL_TABLET | ORAL | Status: AC
  Filled 2014-06-24: qty 2

## 2014-06-24 MED ORDER — TECHNETIUM TC 99M SESTAMIBI GENERIC - CARDIOLITE
10.0000 | Freq: Once | INTRAVENOUS | Status: AC | PRN
  Administered 2014-06-24: 10 via INTRAVENOUS

## 2014-06-24 MED ORDER — REGADENOSON 0.4 MG/5ML IV SOLN
0.4000 mg | Freq: Once | INTRAVENOUS | Status: DC
  Filled 2014-06-24: qty 5

## 2014-06-24 MED ORDER — OMEPRAZOLE 40 MG PO CPDR: 40.0000 mg | DELAYED_RELEASE_CAPSULE | Freq: Every morning | ORAL | Status: DC

## 2014-06-24 MED ORDER — REGADENOSON 0.4 MG/5ML IV SOLN
INTRAVENOUS | Status: AC
  Administered 2014-06-24: 0.4 mg
  Filled 2014-06-24: qty 5

## 2014-06-24 MED ORDER — SUCRALFATE 1 G PO TABS
1.0000 g | ORAL_TABLET | Freq: Three times a day (TID) | ORAL | Status: DC
Start: 2014-06-24 — End: 2017-06-30

## 2014-06-24 MED ORDER — OMEPRAZOLE 40 MG PO CPDR: 40.0000 mg | DELAYED_RELEASE_CAPSULE | Freq: Two times a day (BID) | ORAL | Status: DC

## 2014-06-24 MED ORDER — TECHNETIUM TC 99M SESTAMIBI - CARDIOLITE
30.0000 | Freq: Once | INTRAVENOUS | Status: AC | PRN
  Administered 2014-06-24: 11:00:00 30 via INTRAVENOUS

## 2014-06-24 NOTE — Progress Notes (Signed)
   NST negative for ischemia. EF normal on echo, 50-55%.  Gloria Lewis 06/24/2014

## 2014-06-24 NOTE — Progress Notes (Signed)
SUBJECTIVE:  Had a little CP last night  OBJECTIVE:   Vitals:   Filed Vitals:   06/23/14 0517 06/23/14 1349 06/23/14 2110 06/24/14 0451  BP: 124/78 121/88 101/65 95/62  Pulse: 64 74 70 69  Temp: 97.8 F (36.6 C) 98.2 F (36.8 C) 98.2 F (36.8 C) 97.6 F (36.4 C)  TempSrc: Oral Oral Oral Oral  Resp: 18 18 18 18   Height:      Weight:      SpO2: 99% 98% 95% 96%   I&O's:   Intake/Output Summary (Last 24 hours) at 06/24/14 0840 Last data filed at 06/23/14 1900  Gross per 24 hour  Intake    720 ml  Output      0 ml  Net    720 ml   TELEMETRY: Reviewed telemetry pt in NSR:     PHYSICAL EXAM General: Well developed, well nourished, in no acute distress Head: Eyes PERRLA, No xanthomas.   Normal cephalic and atramatic  Lungs:   Clear bilaterally to auscultation and percussion. Heart:   HRRR S1 S2 Pulses are 2+ & equal. Abdomen: Bowel sounds are positive, abdomen soft and non-tender without massesExtremities:   No clubbing, cyanosis or edema.  DP +1 Neuro: Alert and oriented X 3. Psych:  Good affect, responds appropriately   LABS: Basic Metabolic Panel:  Recent Labs  06/21/14 1227 06/22/14 0511  NA 141 141  K 3.7 3.5  CL 100 100  CO2 28 30  GLUCOSE 103* 103*  BUN 11 17  CREATININE 0.81 0.84  CALCIUM 10.2 9.1   Liver Function Tests: No results for input(s): AST, ALT, ALKPHOS, BILITOT, PROT, ALBUMIN in the last 72 hours. No results for input(s): LIPASE, AMYLASE in the last 72 hours. CBC:  Recent Labs  06/21/14 1227 06/22/14 0511  WBC 7.5 7.5  HGB 15.6* 13.8  HCT 46.0 42.7  MCV 90.4 92.0  PLT 387 386   Cardiac Enzymes:  Recent Labs  06/22/14 0257 06/22/14 0511  TROPONINI <0.03 <0.03   BNP: Invalid input(s): POCBNP D-Dimer: No results for input(s): DDIMER in the last 72 hours. Hemoglobin A1C: No results for input(s): HGBA1C in the last 72 hours. Fasting Lipid Panel: No results for input(s): CHOL, HDL, LDLCALC, TRIG, CHOLHDL, LDLDIRECT in  the last 72 hours. Thyroid Function Tests: No results for input(s): TSH, T4TOTAL, T3FREE, THYROIDAB in the last 72 hours.  Invalid input(s): FREET3 Anemia Panel: No results for input(s): VITAMINB12, FOLATE, FERRITIN, TIBC, IRON, RETICCTPCT in the last 72 hours. Coag Panel:   Lab Results  Component Value Date   INR 0.93 06/21/2014   INR 0.94 08/25/2011   INR 0.95 09/11/2010    RADIOLOGY: Dg Chest 2 View  06/21/2014   CLINICAL DATA:  Shortness of breath and cough for 3 days  EXAM: CHEST  2 VIEW  COMPARISON:  12/21/2013  FINDINGS: Cardiac shadow is stable. Some bibasilar scarring is again seen. No focal infiltrate or sizable effusion is noted. Mild degenerative changes of the thoracic spine are seen.  IMPRESSION: Bibasilar scarring, no acute abnormality noted.   Electronically Signed   By: Inez Catalina M.D.   On: 06/21/2014 17:44    ASSESSMENT AND PLAN:   1. SOB - BNP normal and chest xray clear. May be due to labile HTN  2. HTN (hypertension) - controlled  3. Chest pain - cardiac enzymes negative x 3. 2D echo with normal LVF and grade I diastolic dysfunction.. NPO for [employer] today. I suspect this is related  to GERD since belching resolved her pain.   4/ GERD - continue protonix   Gloria Margarita, MD  06/24/2014  8:40 AM

## 2014-06-24 NOTE — Progress Notes (Signed)
UR completed 

## 2014-07-06 ENCOUNTER — Encounter (HOSPITAL_COMMUNITY): Payer: Self-pay | Admitting: Psychiatry

## 2014-07-06 ENCOUNTER — Ambulatory Visit (INDEPENDENT_AMBULATORY_CARE_PROVIDER_SITE_OTHER): Payer: Federal, State, Local not specified - Other | Admitting: Psychiatry

## 2014-07-06 VITALS — BP 105/73 | HR 68 | Ht 66.0 in | Wt 201.0 lb

## 2014-07-06 DIAGNOSIS — F329 Major depressive disorder, single episode, unspecified: Secondary | ICD-10-CM

## 2014-07-06 DIAGNOSIS — F331 Major depressive disorder, recurrent, moderate: Secondary | ICD-10-CM | POA: Insufficient documentation

## 2014-07-06 MED ORDER — LEVOMILNACIPRAN HCL ER 20 MG PO CP24: ORAL_CAPSULE | ORAL | Status: DC

## 2014-07-06 MED ORDER — DIAZEPAM 5 MG PO TABS: ORAL_TABLET | ORAL | Status: DC

## 2014-07-06 MED ORDER — ZOLPIDEM TARTRATE ER 12.5 MG PO TBCR: 12.5000 mg | EXTENDED_RELEASE_TABLET | Freq: Every evening | ORAL | Status: DC | PRN

## 2014-07-06 MED ORDER — NORTRIPTYLINE HCL 10 MG PO CAPS: ORAL_CAPSULE | ORAL | Status: DC

## 2014-07-06 NOTE — Progress Notes (Addendum)
[facility] MD Progress Note  07/06/2014 9:24 AM JULLIANA WHITMYER  MRN:  [id number] Subjective: Fair The patient's had some significant medical problems since I've seen her. While she was at work her blood pressure was taken and found to be 180/110. She was then seen in the hospital and admitted for 3 days. She ruled out for a MI. The patient does have some significant chest discomfort which is likely related to a GI source. She's going to have an endoscopy in the next week. The patient does have multiple medical problems and that recently she was called because she was doing some labs demonstrating some kidney problems. The patient has scheduled a number of Cardiolite tests over the next week or 2 in addition to her endoscopy. It is noted also she has an expected bladder surgery coming up in the next few weeks or the next month. All this in the context that she is just about ready to graduate from school in one month. Noted also to add to her stresses is the fact that her cousin she was close to died since I've seen her. She did attend the funeral. And lastly the patient is now instructed that she must move out of her present apartment into a smaller room apartment and she's busy packing boxes for this move. This of course complicated because she is instructed since her cardiac issues are not resolved that she should not lift heavy boxes. On the other hand she has a very close friend named Thelma Martinique and her daughter who are very supportive. Further her son who is chronically mentally ill lives independently is taking his medications and is actually supportive towards the patient. In close evaluation the patient denies persistent unrelenting depression. She says it comes and goes and mainly as related to when she has free time. In essence when she is focused and organized and has a task in front of her she is not impacted or impaired iron depressed mood state. In essence her depression does not affect her functioning  at all. The patient is a straight a Ship broker. She is making all the medical appointments and is very compliant. The patient says for the last month or so her sleep has not improved. Her last visit we doubled her Restoril which has not made any difference. The patient's appetite is somewhat reduced but her energy level is relatively stable. She can think and concentrate and feels good about herself. The patient is not suicidal. Today we clarified what medication she was taking. It turns out that she's taking 20 mg of Fetzima not 40 mg. She is also taking nortriptyline 10 mg that is given by one of her other doctors that she thinks is for sleep. Principal Problem: Major Depression Diagnosis:   Patient Active Problem List   Diagnosis Date Noted  . Essential hypertension [I10]   . SOB (shortness of breath) [R06.02] 06/21/2014  . Precordial pain [R07.2] 06/21/2014  . GERD (gastroesophageal reflux disease) [K21.9] 06/21/2014  . Chest pain [R07.9] 06/21/2014  . HTN (hypertension) [I10]   . Neck pain [M54.2] 01/08/2014  . Major depressive disorder, recurrent episode, severe, without mention of psychotic behavior [F33.2] 10/14/2012  . Schizoaffective disorder [F25.9] 05/26/2012  . 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 Hyperparathyroidism, primary [E21.0] 10/06/2010  . DEGENERATIVE DISC DISEASE, LUMBOSACRAL SPINE [M51.37] 05/21/2007  . DERMATOPHYTOSIS OF THE BODY [B35.4] 05/10/2007  . 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 Anxiety state [F41.1] 03/24/2007  . DENTAL PAIN [K08.9] 03/24/2007  .  SHOULDER PAIN, LEFT [M25.519] 03/24/2007   Total Time spent with patient: 45 minutes   Past Medical History:  Past Medical History  Diagnosis Date  . HTN (hypertension)   . Bipolar 1 disorder   . OA (osteoarthritis)   . GERD (gastroesophageal reflux disease)   . Hypercholesterolemia   . Fever blister   . HA (headache)   . Colon polyp   . CTS (carpal tunnel syndrome)   . Schizo-affective  psychosis   . Anxiety   . Depression   . Migraines     Past Surgical History  Procedure Laterality Date  . Neck surgery  2009  . Carpal tunnel release  20110 rt/lt  . Polp removed  2011  . Back injection    . 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 Anterior cervical decomp/discectomy fusion  08/27/2011    Procedure: ANTERIOR CERVICAL DECOMPRESSION/DISCECTOMY FUSION 1 LEVEL/HARDWARE REMOVAL;  Surgeon: Eustace Moore, MD;  Location: [place] [facility];  Service: Neurosurgery;  Laterality: Bilateral;  Cervical four-five Anterior cervical decompression/diskectomy, fusion, Plate, Removal of Cervical five-seven Plate  . Hemorrhoid surgery     Family History:  Family History  Problem Relation Age of Onset  . Coronary artery disease Father   . Hypertension Mother   . Schizophrenia Mother   . Depression Brother   . Prostate cancer Brother   . Anesthesia problems Neg Hx   . Hypotension Neg Hx   . Malignant hyperthermia Neg Hx   . Pseudochol deficiency Neg Hx   . Cancer Father     head neck    Social History:  History  Alcohol Use No     History  Drug Use No    History   Social History  . Marital Status: Single    Spouse Name: N/A  . Number of Children: N/A  . Years of Education: N/A   Social History Main Topics  . Smoking status: Current Some Day Smoker -- 0.25 packs/day for 30 years    Types: Cigarettes  . Smokeless tobacco: Never Used  . Alcohol Use: No  . Drug Use: No  . Sexual Activity: No   Other Topics Concern  . None   Social History Narrative   Additional History:    Sleep: Poor  Appetite:  Fair   Assessment:   Musculoskeletal: Strength & Muscle Tone:  Gait & Station:  Patient leans:    Psychiatric Specialty Exam: Physical Exam  ROS  Blood pressure 105/73, pulse 68, height 5\' 6"  (1.676 m), weight 201 lb (91.173 kg).Body mass index is 32.46 kg/(m^2).  General Appearance: Fairly Groomed  Engineer, water::  Good   Speech:  Clear and Coherent  Volume:  Normal  Mood:  Euthymic  Affect:  Appropriate  Thought Process:  Coherent  Orientation:  Full (Time, Place, and Person)  Thought Content:  WDL  Suicidal Thoughts:  No  Homicidal Thoughts:  No  Memory:  NA  Judgement:  Good  Insight:  Good  Psychomotor Activity:  Normal  Concentration:  Good  Recall:  Good  Fund of Knowledge:Good  Language: Good  Akathisia:  No  Handed:  Right  AIMS (if indicated):     Assets:  Desire for Improvement  ADL's:  Intact  Cognition: WNL  Sleep:        Current Medications: Current Outpatient Prescriptions  Medication Sig Dispense Refill  . aspirin EC 81 MG EC tablet Take 1 tablet (81 mg total)  by mouth daily. 30 tablet 0  . atorvastatin (LIPITOR) 10 MG tablet Take 10 mg by mouth daily.    . diazepam (VALIUM) 5 MG tablet 2 bid 120 tablet 4  . FLUoxetine (PROZAC) 40 MG capsule Take 40 mg by mouth daily.     . fluticasone (FLONASE) 50 MCG/ACT nasal spray Place 2 sprays into both nostrils daily.  5  . folic acid (FOLVITE) 1 MG tablet Take 1 mg by mouth daily.    . Levomilnacipran HCl ER (FETZIMA) 20 MG CP24 1 qam for 2 days then 2 qam 60 capsule 5  . Levomilnacipran HCl ER 40 MG CP24 Take 40 mg by mouth daily. 30 capsule 4  . Linaclotide (LINZESS) 290 MCG CAPS capsule Take 290 mcg by mouth daily.    . Loratadine 10 MG CAPS Take 10 mg by mouth daily.    . mesalamine (CANASA) 1000 MG suppository Place 1,000 mg rectally daily as needed (severe constipation.).    Marland Kitchen mirabegron ER (MYRBETRIQ) 50 MG TB24 tablet Take 50 mg by mouth every other day.    . Multiple Vitamins-Minerals (MULTIVITAMIN WITH MINERALS) tablet Take 1 tablet by mouth every morning.     . nortriptyline (PAMELOR) 10 MG capsule 1 qhs 30 capsule 5  . oxyCODONE-acetaminophen (PERCOCET) 5-325 MG per tablet Take 1 tablet by mouth every 6 (six) hours as needed. (Patient not taking: Reported on 02/21/2014) 10 tablet 0  . PRESCRIPTION MEDICATION Inject 1  each into the skin once. Cortisone injection in back.    . propranolol (INDERAL) 40 MG tablet Take 1 tablet (40 mg total) by mouth 2 (two) times daily. 60 tablet 2  . sucralfate (CARAFATE) 1 G tablet Take 1 tablet (1 g total) by mouth 3 (three) times daily with meals. 90 tablet 0  . tiZANidine (ZANAFLEX) 4 MG tablet Take 0.5 tablets (2 mg total) by mouth every 6 (six) hours as needed for muscle spasms (as directed 2mg  at bedtime for 1 week, then 2mg  twice daily for 1 week, then 2mg  three times daily.). 2mg  at bedtime for 1 week, then 2mg  twice daily for 1 week, then 2mg  three times daily. 60 tablet 2  . topiramate (TOPAMAX) 25 MG capsule Take 25 mg by mouth 2 (two) times daily.  5  . valACYclovir (VALTREX) 1000 MG tablet Take 1,000 mg by mouth daily.    . vitamin B-12 (CYANOCOBALAMIN) 1000 MCG tablet Take 1,000 mcg by mouth daily.    Marland Kitchen zolpidem (AMBIEN CR) 12.5 MG CR tablet Take 1 tablet (12.5 mg total) by mouth at bedtime as needed for sleep. 30 tablet 5  . zonisamide (ZONEGRAN) 100 MG capsule Take 400 mg by mouth at bedtime.      No current facility-administered medications for this visit.    Lab Results: No results found for this or any previous visit (from the past 48 hour(s)).  Physical Findings: AIMS:  , ,  ,  ,    CIWA:    COWS:     Treatment Plan Summary: At this time we will make a number of interventions. First we'll increase her nortriptyline that she's always been taking at 10 mg and increase it to 25 mg at night. This hopefully will help her sleep and improve her mood. Second will increase her Fetzima to the dose of 40 mg. Third we'll discontinue her Restoril and begin her on Ambien drop 0.5 CR for sleep. The patient will continue in therapy with Marjie Skiff. This patient is not suicidal.  Today reviewed the pros and cons of her medications and she agreed to take them as prescribed. This morning she is not having chest pain or shortness of breath or GI complaints. Over the next few  weeks it sounds like she'll have a cardiac evaluation and endoscopy. Potentially she'll find out when she's going to have bladder surgery which may occur within the next month. This patient she'll return to see me in 7 weeks.   Medical Decision Making:  New problem, with additional work up planned     [place], Charlestine Night 07/06/2014, 9:24 AM

## 2014-07-16 ENCOUNTER — Ambulatory Visit (INDEPENDENT_AMBULATORY_CARE_PROVIDER_SITE_OTHER): Payer: Medicaid Other | Admitting: Neurology

## 2014-07-16 ENCOUNTER — Encounter: Payer: Self-pay | Admitting: Neurology

## 2014-07-16 VITALS — BP 136/80 | HR 84 | Resp 20 | Ht 67.0 in | Wt 211.7 lb

## 2014-07-16 DIAGNOSIS — G43009 Migraine without aura, not intractable, without status migrainosus: Secondary | ICD-10-CM | POA: Diagnosis not present

## 2014-07-16 MED ORDER — NORTRIPTYLINE HCL 50 MG PO CAPS: 50.0000 mg | ORAL_CAPSULE | Freq: Every day | ORAL | Status: DC

## 2014-07-16 NOTE — Progress Notes (Signed)
NEUROLOGY FOLLOW UP OFFICE NOTE  Gloria Lewis [id number]  HISTORY OF PRESENT ILLNESS: Gloria Lewis is a 59 year old right-handed woman with history of hypertension, prior drug and alcohol abuse, major depressive disorder, bipolar disorder, schizoaffective disorder, migraines, tension headache, cervical disc disease status post 2 surgeries, lumbar degenerative disc disease, osteoarthritis, prolapsed bladder, hypercholesterolemia, IBS, and GERD who follows up for chronic tension-type migraines.  Records, labs and cardiac study reports reviewed.  UPDATE: Last visit, we decided to stop propranolol as it caused depression.  Last month, she was admitted to the hospital with atypical chest pain.  Blood pressure was elevated.  Cardiac workup, including EKG, troponins, echocardiogram and Lexiscan were negative.  She was restarted on propranolol at 40mg  twice daily for hypertension. Headaches have improved.  She has them about 2 to 3 times a week now.  They respond to a bath and swim.  Current abortive therapy:  Excedrin, cyclobenzaprine 10mg  Current preventative therapy:  nortriptyline 25mg  Other current medications:  fluoxetine, G81, MVI, folic acid, omeprazole  Caffeine:  no Alcohol:  no Smoker:  no Diet:  good Exercise:  swims Depression/stress:  Increased stress this year due to mental health of her son, which has caused increase in headache Sleep hygiene:  Falls asleep quickly but always wakes up at 4am, but feels refreshed  HISTORY: Onset:  Since childhood, but worse since 2009 Location:  Bi-temporal and at base of her skull, neck pain sometimes radiating down into right shoulder Quality:  Constant severe aching (other daily headaches are throbbing). Initial intensity:  8/10; January 8/10 Aura:  no Prodrome:  no Associated symptoms:  Nausea, photophobia, phonophobia, osmophobia, blurred vision Initial Duration:  1 hour if lays down to rest (otherwise all day); January 3-4  hours Initial Frequency:  3 times a week (but has daily headaches); January daily Triggers/exacerbating factors:  Stress, sleeping on right side (where she had cervical spine surgery), movement, neck pain Relieving factors:  Laying down to rest in dark room Activity:  Cannot function  Past abortive therapy:  Advil, Aleve, Tylenol, cyclobenzaprine (helped), chlorpromazine HCL 25-50mg , Tramadol Past preventative therapy:  Trigger point injections (helped briefly), topamax (effective but told to get off for unknown reason), Zoloft (used for depression, made headache worse), PT of the neck, Accupuncture, zonisamide 400mg  (ineffective), propranolol (depression)  PAST MEDICAL HISTORY: Past Medical History  Diagnosis Date  . HTN (hypertension)   . Bipolar 1 disorder   . OA (osteoarthritis)   . GERD (gastroesophageal reflux disease)   . Hypercholesterolemia   . Fever blister   . HA (headache)   . Colon polyp   . CTS (carpal tunnel syndrome)   . Schizo-affective psychosis   . Anxiety   . Depression   . Migraines     MEDICATIONS: Current Outpatient Prescriptions on File Prior to Visit  Medication Sig Dispense Refill  . aspirin EC 81 MG EC tablet Take 1 tablet (81 mg total) by mouth daily. 30 tablet 0  . atorvastatin (LIPITOR) 10 MG tablet Take 10 mg by mouth daily.    . diazepam (VALIUM) 5 MG tablet 2 bid 120 tablet 4  . FLUoxetine (PROZAC) 40 MG capsule Take 40 mg by mouth daily.     . fluticasone (FLONASE) 50 MCG/ACT nasal spray Place 2 sprays into both nostrils daily.  5  . folic acid (FOLVITE) 1 MG tablet Take 1 mg by mouth daily.    . Levomilnacipran HCl ER (FETZIMA) 20 MG CP24 1 qam for 2 days  then 2 qam 60 capsule 5  . Levomilnacipran HCl ER 40 MG CP24 Take 40 mg by mouth daily. 30 capsule 4  . Linaclotide (LINZESS) 290 MCG CAPS capsule Take 290 mcg by mouth daily.    . Loratadine 10 MG CAPS Take 10 mg by mouth daily.    . mesalamine (CANASA) 1000 MG suppository Place 1,000 mg  rectally daily as needed (severe constipation.).    Marland Kitchen mirabegron ER (MYRBETRIQ) 50 MG TB24 tablet Take 50 mg by mouth every other day.    . Multiple Vitamins-Minerals (MULTIVITAMIN WITH MINERALS) tablet Take 1 tablet by mouth every morning.     Marland Kitchen oxyCODONE-acetaminophen (PERCOCET) 5-325 MG per tablet Take 1 tablet by mouth every 6 (six) hours as needed. (Patient not taking: Reported on 02/21/2014) 10 tablet 0  . PRESCRIPTION MEDICATION Inject 1 each into the skin once. Cortisone injection in back.    . propranolol (INDERAL) 40 MG tablet Take 1 tablet (40 mg total) by mouth 2 (two) times daily. 60 tablet 2  . sucralfate (CARAFATE) 1 G tablet Take 1 tablet (1 g total) by mouth 3 (three) times daily with meals. 90 tablet 0  . tiZANidine (ZANAFLEX) 4 MG tablet Take 0.5 tablets (2 mg total) by mouth every 6 (six) hours as needed for muscle spasms (as directed 2mg  at bedtime for 1 week, then 2mg  twice daily for 1 week, then 2mg  three times daily.). 2mg  at bedtime for 1 week, then 2mg  twice daily for 1 week, then 2mg  three times daily. 60 tablet 2  . topiramate (TOPAMAX) 25 MG capsule Take 25 mg by mouth 2 (two) times daily.  5  . valACYclovir (VALTREX) 1000 MG tablet Take 1,000 mg by mouth daily.    . vitamin B-12 (CYANOCOBALAMIN) 1000 MCG tablet Take 1,000 mcg by mouth daily.    Marland Kitchen zolpidem (AMBIEN CR) 12.5 MG CR tablet Take 1 tablet (12.5 mg total) by mouth at bedtime as needed for sleep. 30 tablet 5  . zonisamide (ZONEGRAN) 100 MG capsule Take 400 mg by mouth at bedtime.      No current facility-administered medications on file prior to visit.    ALLERGIES: Allergies  Allergen Reactions  . Amoxicillin Itching  . Chantix [Varenicline Tartrate] Nausea Only  . Effexor [Venlafaxine Hydrochloride] Itching and Other (See Comments)    headache  . Norco [Hydrocodone-Acetaminophen] Itching  . Paroxetine Hcl Other (See Comments)    headache  . Penicillins Hives  . Tramadol     Pt states it interacted  with her sertraline, but she is no longer on sertraline.  She does not remember the type of reaction she had.   . Zithromax [Azithromycin Dihydrate] Swelling  . Hydrocodone Other (See Comments)    headache    FAMILY HISTORY: Family History  Problem Relation Age of Onset  . Coronary artery disease Father   . Hypertension Mother   . Schizophrenia Mother   . Depression Brother   . Prostate cancer Brother   . Anesthesia problems Neg Hx   . Hypotension Neg Hx   . Malignant hyperthermia Neg Hx   . Pseudochol deficiency Neg Hx   . Cancer Father     head neck     SOCIAL HISTORY: History   Social History  . Marital Status: Single    Spouse Name: N/A  . Number of Children: N/A  . Years of Education: N/A   Occupational History  . Not on file.   Social History Main Topics  .  Smoking status: Current Some Day Smoker -- 0.25 packs/day for 30 years    Types: Cigarettes  . Smokeless tobacco: Never Used  . Alcohol Use: No  . Drug Use: No  . Sexual Activity: No   Other Topics Concern  . Not on file   Social History Narrative    REVIEW OF SYSTEMS: Constitutional: No fevers, chills, or sweats, no generalized fatigue, change in appetite Eyes: No visual changes, double vision, eye pain Ear, nose and throat: No hearing loss, ear pain, nasal congestion, sore throat Cardiovascular: No chest pain, palpitations Respiratory:  No shortness of breath at rest or with exertion, wheezes GastrointestinaI: No nausea, vomiting, diarrhea, abdominal pain, fecal incontinence Genitourinary:  No dysuria, urinary retention or frequency Musculoskeletal:  No neck pain, back pain Integumentary: No rash, pruritus, skin lesions Neurological: as above Psychiatric: No depression, insomnia, anxiety Endocrine: No palpitations, fatigue, diaphoresis, mood swings, change in appetite, change in weight, increased thirst Hematologic/Lymphatic:  No anemia, purpura, petechiae. Allergic/Immunologic: no itchy/runny  eyes, nasal congestion, recent allergic reactions, rashes  PHYSICAL EXAM: Filed Vitals:   07/16/14 0759  BP: 136/80  Pulse: 84  Resp: 20   General: No acute distress Head:  Normocephalic/atraumatic Eyes:  Fundoscopic exam unremarkable without vessel changes, exudates, hemorrhages or papilledema. Neck: supple, no paraspinal tenderness, full range of motion Heart:  Regular rate and rhythm Lungs:  Clear to auscultation bilaterally Back: No paraspinal tenderness Neurological Exam: alert and oriented to person, place, and time. Attention span and concentration intact, recent and remote memory intact, fund of knowledge intact.  Speech fluent and not dysarthric, language intact.  CN II-XII intact. Fundoscopic exam unremarkable without vessel changes, exudates, hemorrhages or papilledema.  Bulk and tone normal, muscle strength 5/5 throughout.  Sensation to light touch intact.  Deep tendon reflexes 2+ throughout, toes downgoing.  Finger to nose and heel to shin testing intact.  Gait normal, Romberg negative.  IMPRESSION: Migraine without aura  PLAN: 1.  Increase nortriptyline to 50mg  at bedtime 2.  Call in 4 weeks with update 3.  Follow up in 3 months.  Metta Clines, DO  CC: Harlan Stains, MD

## 2014-07-16 NOTE — Patient Instructions (Signed)
1.  We will increase nortriptyline to 50mg  at bedtime.  Call in 4 weeks with update. 2.  Follow up in 3 months.

## 2014-07-24 ENCOUNTER — Institutional Professional Consult (permissible substitution): Payer: Self-pay | Admitting: Internal Medicine

## 2014-07-27 ENCOUNTER — Other Ambulatory Visit (INDEPENDENT_AMBULATORY_CARE_PROVIDER_SITE_OTHER): Payer: Medicaid Other

## 2014-07-27 ENCOUNTER — Encounter: Payer: Self-pay | Admitting: Internal Medicine

## 2014-07-27 ENCOUNTER — Ambulatory Visit (INDEPENDENT_AMBULATORY_CARE_PROVIDER_SITE_OTHER): Payer: Medicaid Other | Admitting: Internal Medicine

## 2014-07-27 VITALS — BP 134/100 | HR 77 | Ht 66.0 in | Wt 212.0 lb

## 2014-07-27 DIAGNOSIS — Z720 Tobacco use: Secondary | ICD-10-CM

## 2014-07-27 DIAGNOSIS — F17210 Nicotine dependence, cigarettes, uncomplicated: Secondary | ICD-10-CM

## 2014-07-27 DIAGNOSIS — I10 Essential (primary) hypertension: Secondary | ICD-10-CM

## 2014-07-27 DIAGNOSIS — R0602 Shortness of breath: Secondary | ICD-10-CM

## 2014-07-27 LAB — BASIC METABOLIC PANEL
BUN: 9 mg/dL (ref 6–23)
CALCIUM: 9.5 mg/dL (ref 8.4–10.5)
CO2: 30 mEq/L (ref 19–32)
Chloride: 102 mEq/L (ref 96–112)
Creatinine, Ser: 0.9 mg/dL (ref 0.40–1.20)
GFR: 82.77 mL/min (ref >60.00)
Glucose, Bld: 99 mg/dL (ref 70–99)
Potassium: 3.9 mEq/L (ref 3.5–5.1)
SODIUM: 138 meq/L (ref 135–145)

## 2014-07-27 LAB — CBC WITH DIFFERENTIAL/PLATELET
BASOS ABS: 0.1 10*3/uL (ref 0.0–0.1)
Basophils Relative: 0.9 % (ref 0.0–3.0)
EOS PCT: 8.4 % — AB (ref 0.0–5.0)
Eosinophils Absolute: 0.7 10*3/uL (ref 0.0–0.7)
HCT: 42.3 % (ref 36.0–46.0)
Hemoglobin: 14.2 g/dL (ref 12.0–15.0)
LYMPHS ABS: 3.4 10*3/uL (ref 0.7–4.0)
LYMPHS PCT: 41.2 % (ref 12.0–46.0)
MCHC: 33.5 g/dL (ref 30.0–36.0)
MCV: 89.6 fl (ref 78.0–100.0)
MONOS PCT: 5 % (ref 3.0–12.0)
Monocytes Absolute: 0.4 10*3/uL (ref 0.1–1.0)
Neutro Abs: 3.7 10*3/uL (ref 1.4–7.7)
Neutrophils Relative %: 44.5 % (ref 43.0–77.0)
PLATELETS: 385 10*3/uL (ref 150.0–400.0)
RBC: 4.73 Mil/uL (ref 3.87–5.11)
RDW: 14.1 % (ref 11.5–15.5)
WBC: 8.4 10*3/uL (ref 4.0–10.5)

## 2014-07-27 LAB — BRAIN NATRIURETIC PEPTIDE: Pro B Natriuretic peptide (BNP): 18 pg/mL (ref 0.0–100.0)

## 2014-07-27 LAB — TSH: TSH: 1.45 u[IU]/mL (ref 0.35–4.50)

## 2014-07-27 LAB — D-DIMER, QUANTITATIVE: D-Dimer, Quant: 0.51 ug/mL-FEU — ABNORMAL HIGH (ref 0.00–0.48)

## 2014-07-27 MED ORDER — NEBIVOLOL HCL 10 MG PO TABS: ORAL_TABLET | ORAL | Status: DC

## 2014-07-27 NOTE — Progress Notes (Signed)
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 Why sob /cp while on inderol neg cards w/u completed 06/24/14  >continued inderol  >   Dr Dema Severin started symbicort post admit but no better  so referred by Dr Dema Severin to pulmonary clinic 07/27/2014    07/27/2014 1st [facility] office visit/ Reis Goga   Chief Complaint  Patient presents with  . Advice Only    Dyspnea per Dr.White; SOB w/activity longer than 2 months; chest tightness and CP off and on;   on symbicort x 2 weeks no better because still can't do steps since March 17 admit  and by 1 pm freq needs another inhaler even if sitting still plus definitely having noct "wheeze after lie down"   No obvious day to day or daytime variabilty or assoc chronic cough or   overt sinus or hb symptoms. No unusual exp hx or h/o childhood pna/ asthma or knowledge of premature birth.  Sleeping ok without nocturnal  or early am exacerbation  of respiratory  c/o's or need for noct saba. Also denies any obvious fluctuation of symptoms with weather or environmental changes or other aggravating or alleviating factors except as outlined above   Current Medications, Allergies, Complete Past Medical History, Past Surgical History, Family History, and Social History were reviewed in [employer] record.  ROS  The following are not active complaints unless bolded sore throat, dysphagia, dental problems, itching, sneezing,  nasal congestion or excess/ purulent secretions, ear ache,   fever, chills, sweats, unintended wt loss, pleuritic or exertional cp, hemoptysis,  orthopnea pnd or leg swelling, presyncope, palpitations, heartburn, abdominal pain, anorexia, nausea, vomiting, diarrhea  or change in bowel  or urinary habits, change in stools or urine, dysuria,hematuria,  rash, arthralgias, visual complaints, headache, numbness weakness or ataxia or problems with walking or coordination,  change in mood/affect or memory.         Review of Systems     Objective:   Physical Exam   Obese bf very confused with details of care - reported admit was Aug 17 then repeatedly said her admit was in august 2017 not march 2017 and only after 15 minutes of attempting to get accurate hx changed to "well then it was march, not august"    Wt Readings from Last 3 Encounters:  07/27/14 212 lb (96.163 kg)  07/16/14 211 lb 11.2 oz (96.026 kg)  07/06/14 201 lb (91.173 kg)    Vital signs reviewed  HEENT: nl dentition, turbinates, and orophanx. Nl external ear canals without cough reflex   NECK :  without JVD/Nodes/TM/ nl carotid upstrokes bilaterally   LUNGS: no acc muscle use, clear to A and P bilaterally without cough on insp or exp maneuvers   CV:  RRR  no s3 or murmur or increase in P2, no edema   ABD:  soft and nontender with nl excursion in the supine position. No bruits or organomegaly, bowel sounds nl  MS:  warm without deformities, calf tenderness, cyanosis or clubbing  SKIN: warm and dry without lesions    NEURO:  alert, approp, no deficits    I personally reviewed images and agree with radiology impression as follows:  CXR:  06/21/14  Cardiac shadow is stable. Some bibasilar scarring is again seen. No focal infiltrate or sizable effusion is noted. Mild degenerative changes of the thoracic spine are seen  Labs ordered/ reviewed    Lab 07/27/14 1039  NA 138  K 3.9  CL 102  CO2 30  BUN 9  CREATININE 0.90  GLUCOSE 99    Recent Labs Lab 07/27/14 1039  HGB 14.2  HCT 42.3  WBC 8.4  PLT 385.0     Lab Results  Component Value Date   TSH 1.45 07/27/2014     Lab Results  Component Value Date   PROBNP 18.0 07/27/2014     . Lab Results  Component Value Date    DDIMER 0.51* 07/27/2014           Assessment:

## 2014-07-27 NOTE — Patient Instructions (Addendum)
Please remember to go to the lab department downstairs for your tests - we will call you with the results when they are available.  Stop inderol and start bystolic 10 mg twice  daily instead   Continue symbicort 160 Take 2 puffs first thing in am and then another 2 puffs about 12 hours later.   Work on inhaler technique:  relax and gently blow all the way out then take a nice smooth deep breath back in, triggering the inhaler at same time you start breathing in.  Hold for up to 5 seconds if you can.  Rinse and gargle with water when done  See Tammy NP w/in 2 weeks with all your medications, even over the counter meds, separated in two separate bags, the ones you take no matter what vs the ones you stop once you feel better and take only as needed when you feel you need them.   Tammy  will generate for you a new user friendly medication calendar that will put [place] all on the same page re: your medication use.     Without this process, it simply isn't possible to assure that we are providing  your outpatient care  with  the attention to detail we feel you deserve.   If we cannot assure that you're getting that kind of care,  then we cannot manage your problem effectively from this clinic.  Once you have seen Tammy and we are sure that we're all on the same page with your medication use she will arrange follow up with me.  Late add needs v/q next

## 2014-07-28 DIAGNOSIS — F17210 Nicotine dependence, cigarettes, uncomplicated: Secondary | ICD-10-CM | POA: Insufficient documentation

## 2014-07-28 NOTE — Assessment & Plan Note (Addendum)
Echo 06/22/14 Abnormal septal motion The cavity size was normal. There was mild concentric hypertrophy. Systolic function was normal. The estimated ejection fraction was in the range of 50% to 55%. Wall motion was normal; there were no regional wall motion abnormalities. Doppler parameters are consistent with abnormal left ventricular relaxation (grade 1 diastolic dysfunction). - Right ventricle: The cavity size was moderately dilated. Wall thickness was normal. - Right atrium: The atrium was moderately dilated. - Atrial septum: No defect or patent foramen ovale was identified. - Tricuspid valve: There was moderate regurgitation. - Pulmonary arteries: PA peak pressure: 37 mm Hg  07/27/2014  Walked RA x 3 laps @ 185 ft each stopped due to end of study with nl pace, sats at very end down to 83% Spirometry 07/27/14 > restrictive change only     DDX of  difficult airways management all start with A and  include Adherence, Ace Inhibitors, Acid Reflux, Active Sinus Disease, Alpha 1 Antitripsin deficiency, Anxiety masquerading as Airways dz,  ABPA,  allergy(esp in young), Aspiration (esp in elderly), Adverse effects of DPI,  Active smokers, A larbe plus two Bs  = Bronchiectasis and Beta blocker use..and one C= CHF  Adherence is always the initial "prime suspect" and is a multilayered concern that requires a "trust but verify" approach in every patient - starting with knowing how to use medications, especially inhalers, correctly, keeping up with refills and understanding the fundamental difference between maintenance and prns vs those medications only taken for a very short course and then stopped and not refilled.  The proper method of use, as well as anticipated side effects, of a metered-dose inhaler are discussed and demonstrated to the patient. Improved effectiveness after extensive coaching during this visit to a level of approximately  50% so needs work   Active smoking clearly the  biggest issue > see sep a/p  ? Acid (or non-acid) GERD > always difficult to exclude as up to 75% of pts in some series report no assoc GI/ Heartburn symptoms> rec continue max (24h)  acid suppression and diet restrictions/ reviewed     A large PE load unlikely with d dimer so minimal but need v/q to r/o chronic TEPAH on basis of echo   ? BB effect, esp with inderol > rec bystolic trial > see ap

## 2014-07-28 NOTE — Assessment & Plan Note (Signed)

## 2014-07-28 NOTE — Assessment & Plan Note (Signed)
Strongly prefer in this setting: Bystolic, the most beta -1  selective Beta blocker available in sample form, with bisoprolol the most selective generic choice  on the market.  Try bystolic 10 mg bid

## 2014-07-30 ENCOUNTER — Telehealth: Payer: Self-pay | Admitting: *Deleted

## 2014-07-30 ENCOUNTER — Telehealth: Payer: Self-pay | Admitting: Internal Medicine

## 2014-07-30 DIAGNOSIS — R0602 Shortness of breath: Secondary | ICD-10-CM

## 2014-07-30 LAB — ALLERGY FULL PROFILE
Aspergillus fumigatus, m3: <0.1 kU/L
Bahia Grass: <0.1 kU/L
Bermuda Grass: <0.1 kU/L
Box Elder IgE: <0.1 kU/L
Common Ragweed: <0.1 kU/L
Curvularia lunata: <0.1 kU/L
Dog Dander: <0.1 kU/L
Elm IgE: <0.1 kU/L
Goldenrod: <0.1 kU/L
Helminthosporium halodes: <0.1 kU/L
House Dust Hollister: <0.1 kU/L
IgE (Immunoglobulin E), Serum: 14 kU/L (ref <115)
Stemphylium Botryosum: <0.1 kU/L
Sycamore Tree: <0.1 kU/L

## 2014-07-30 NOTE — Telephone Encounter (Signed)
Spoke with the pt and notified needs VQ scan  She agreed and order was placed

## 2014-07-30 NOTE — Telephone Encounter (Signed)
Notes Recorded by Rosana Berger, CMA on 07/30/2014 at 10:30 AM [facility] Notes Recorded by Tanda Rockers, MD on 07/27/2014 at 5:06 PM Call patient : Study is unremarkable, no change in recs ---- Pt is aware of results.

## 2014-07-30 NOTE — Progress Notes (Signed)
Quick Note:  LMTCB ______ 

## 2014-07-30 NOTE — Telephone Encounter (Signed)
-----   Message from Tanda Rockers, MD sent at 07/28/2014  9:52 AM EDT -----   ----- Message -----    From: Tanda Rockers, MD    Sent: 07/28/2014   9:52 AM      To: Tanda Rockers, MD  Needs v/q to complete the w/u for sob

## 2014-08-03 ENCOUNTER — Telehealth: Payer: Self-pay | Admitting: Pulmonary Disease

## 2014-08-03 ENCOUNTER — Ambulatory Visit (HOSPITAL_COMMUNITY)
Admission: RE | Admit: 2014-08-03 | Discharge: 2014-08-03 | Disposition: A | Discharge status: Home / Self Care | Payer: Medicaid Other | Source: Ambulatory Visit | Attending: Internal Medicine | Admitting: Internal Medicine

## 2014-08-03 ENCOUNTER — Other Ambulatory Visit: Payer: Self-pay | Admitting: Internal Medicine

## 2014-08-03 ENCOUNTER — Telehealth: Payer: Self-pay | Admitting: Internal Medicine

## 2014-08-03 ENCOUNTER — Telehealth: Payer: Self-pay | Admitting: Neurology

## 2014-08-03 DIAGNOSIS — R0602 Shortness of breath: Secondary | ICD-10-CM

## 2014-08-03 MED ORDER — TECHNETIUM TO 99M ALBUMIN AGGREGATED
6.0000 | Freq: Once | INTRAVENOUS | Status: AC | PRN
  Administered 2014-08-03: 6 via INTRAVENOUS

## 2014-08-03 MED ORDER — IOHEXOL 350 MG/ML SOLN
100.0000 mL | Freq: Once | INTRAVENOUS | Status: AC | PRN
  Administered 2014-08-03: 100 mL via INTRAVENOUS

## 2014-08-03 MED ORDER — TECHNETIUM TC 99M DIETHYLENETRIAME-PENTAACETIC ACID: 40.0000 | Freq: Once | INTRAVENOUS | Status: AC | PRN

## 2014-08-03 NOTE — Telephone Encounter (Signed)
See below note.

## 2014-08-03 NOTE — Telephone Encounter (Signed)
That's okay.  She is on nortriptyline for headaches.

## 2014-08-03 NOTE — Telephone Encounter (Signed)
lmtcb x1 for pt. 

## 2014-08-03 NOTE — Telephone Encounter (Signed)
Called by radiology that Chest CTA done to r/o PE was negative for PE. Instructed patient to call the [facility] office for follow up on Monday.

## 2014-08-03 NOTE — Progress Notes (Signed)
Quick Note:  Spoke with pt and notified of results per Dr. Wert. Pt verbalized understanding and denied any questions.  ______ 

## 2014-08-03 NOTE — Telephone Encounter (Signed)
Pt called to let Dr. Tomi Likens know that she has been taken off of Propanolol following recent hospitalization. She is currently weaning off. CB# [phone], please call if you have any questions / Sherri S.

## 2014-08-06 ENCOUNTER — Other Ambulatory Visit: Payer: Self-pay | Admitting: *Deleted

## 2014-08-06 ENCOUNTER — Other Ambulatory Visit: Payer: Self-pay | Admitting: Internal Medicine

## 2014-08-06 ENCOUNTER — Telehealth: Payer: Self-pay | Admitting: *Deleted

## 2014-08-06 ENCOUNTER — Telehealth: Payer: Self-pay | Admitting: Internal Medicine

## 2014-08-06 ENCOUNTER — Encounter: Payer: Self-pay | Admitting: *Deleted

## 2014-08-06 DIAGNOSIS — R0602 Shortness of breath: Secondary | ICD-10-CM

## 2014-08-06 MED ORDER — DULOXETINE HCL 30 MG PO CPEP: 30.0000 mg | ORAL_CAPSULE | Freq: Every day | ORAL | Status: DC

## 2014-08-06 NOTE — Telephone Encounter (Signed)
MW are you okay with writing this letter? Thanks.

## 2014-08-06 NOTE — Telephone Encounter (Signed)
Patient states that they recently thought she was having a heart attack the Dr told her to stop taking the pamelor she has nothing else for headache please advise

## 2014-08-06 NOTE — Telephone Encounter (Signed)
We can start her on Cymbalta 30mg  daily

## 2014-08-06 NOTE — Telephone Encounter (Signed)
Pt is calling back and really needs to talk to someone asap [phone]

## 2014-08-06 NOTE — Telephone Encounter (Signed)
Patient states she recently stop taking her bata blocker for blood pressure she has had a server headache since please advise  Call back [phone]

## 2014-08-06 NOTE — Telephone Encounter (Signed)
LMTCb x 2 

## 2014-08-06 NOTE — Telephone Encounter (Signed)
Ok - will need a f/u echo with bubble study and repeat ov a day later in the next 2 weeks so we can regroup

## 2014-08-06 NOTE — Progress Notes (Signed)
Quick Note:  Spoke with pt and notified of results per Dr. Wert. Pt verbalized understanding and denied any questions.  ______ 

## 2014-08-06 NOTE — Telephone Encounter (Signed)
Letter done  Spoke with the pt about ECHO and she is currently seeing [facility] to get this scheduled  Nothing further needed

## 2014-08-06 NOTE — Telephone Encounter (Signed)
Pt in lobby to get letter.  Pt also wants ct results

## 2014-08-06 NOTE — Telephone Encounter (Signed)
MW is already aware per result note. Will sign off.

## 2014-08-06 NOTE — Telephone Encounter (Signed)
Please advise on below  

## 2014-08-06 NOTE — Telephone Encounter (Signed)
Cymbalta 30mg  daily Called into  Pharmacy patient is aware

## 2014-08-07 NOTE — Telephone Encounter (Signed)
lmtcb X3 for pt. 

## 2014-08-10 ENCOUNTER — Ambulatory Visit (HOSPITAL_COMMUNITY): Payer: Medicaid Other | Attending: Cardiology

## 2014-08-10 ENCOUNTER — Other Ambulatory Visit: Payer: Self-pay

## 2014-08-10 DIAGNOSIS — R0600 Dyspnea, unspecified: Secondary | ICD-10-CM | POA: Insufficient documentation

## 2014-08-10 DIAGNOSIS — R0602 Shortness of breath: Secondary | ICD-10-CM

## 2014-08-10 DIAGNOSIS — Z720 Tobacco use: Secondary | ICD-10-CM | POA: Insufficient documentation

## 2014-08-10 DIAGNOSIS — I10 Essential (primary) hypertension: Secondary | ICD-10-CM | POA: Diagnosis not present

## 2014-08-10 NOTE — Progress Notes (Signed)
2D Echo with bubble study completed. 08/10/2014

## 2014-08-14 ENCOUNTER — Ambulatory Visit (INDEPENDENT_AMBULATORY_CARE_PROVIDER_SITE_OTHER): Payer: Medicaid Other | Admitting: Adult Health

## 2014-08-14 ENCOUNTER — Telehealth: Payer: Self-pay | Admitting: Adult Health

## 2014-08-14 ENCOUNTER — Encounter: Payer: Self-pay | Admitting: Adult Health

## 2014-08-14 VITALS — BP 118/72 | HR 87 | Temp 98.1°F | Ht 64.25 in | Wt 215.0 lb

## 2014-08-14 DIAGNOSIS — R0902 Hypoxemia: Secondary | ICD-10-CM | POA: Diagnosis not present

## 2014-08-14 DIAGNOSIS — J9691 Respiratory failure, unspecified with hypoxia: Secondary | ICD-10-CM | POA: Insufficient documentation

## 2014-08-14 DIAGNOSIS — R0602 Shortness of breath: Secondary | ICD-10-CM | POA: Diagnosis not present

## 2014-08-14 DIAGNOSIS — I10 Essential (primary) hypertension: Secondary | ICD-10-CM | POA: Diagnosis not present

## 2014-08-14 DIAGNOSIS — J9611 Chronic respiratory failure with hypoxia: Secondary | ICD-10-CM | POA: Diagnosis not present

## 2014-08-14 MED ORDER — NEBIVOLOL HCL 10 MG PO TABS: 10.0000 mg | ORAL_TABLET | Freq: Every day | ORAL | Status: DC

## 2014-08-14 NOTE — Progress Notes (Signed)
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 Why sob /cp while on inderol neg cards w/u completed 06/24/14  >continued inderol  >   Dr Dema Severin started symbicort post admit but no better  so referred by Dr Dema Severin to pulmonary clinic 07/27/2014    07/27/2014 1st [facility] office visit/ Wert   Chief Complaint  Patient presents with  . Advice Only    Dyspnea per Dr.White; SOB w/activity longer than 2 months; chest tightness and CP off and on;   on symbicort x 2 weeks no better because still can't do steps since March 17 admit  and by 1 pm freq needs another inhaler even if sitting still plus definitely having noct "wheeze after lie down"  >> Change Inderal to Bystolic   6/44/0347 Follow up and Med Review  Patient returns for a two-week follow-up and medication review We reviewed all her medications organize them into a medication calendar with patient education  Last visit. Patient was seen for initial pulmonary consultation for dyspnea. Lab work showed a minimally elevated d-dimer.  2-D  echo done in March of this year showed moderately dilated right ventricle and right atrium with pulmonary artery pressures around 37 . EF at 42-59%, grade 1 diastolic dysfunction.  . Patient did desaturate at last visit. VQ scan showed high probability. Subsequent CT and she had a chest showed no evidence of pulmonary embolism with right middle lobe and lingular scarring versus atelectasis. A repeat echo with bubble study showed no shunt with pulmonary artery pressures at 42. Complains on DOE with walking , swimming, inclines and stairs.  No exertional chest pain . Dypsnea with anxiety attacks.     she continues to smoke, smoking cessation was discussed   Current Medications, Allergies, Complete Past Medical History, Past Surgical History, Family History, and Social History were reviewed in [employer] record.  ROS  The following are not active complaints unless bolded sore throat, dysphagia, dental problems, itching, sneezing,  nasal congestion or excess/ purulent secretions, ear ache,   fever, chills, sweats, unintended wt loss, pleuritic or exertional cp, hemoptysis,  orthopnea pnd or leg swelling, presyncope, palpitations, heartburn, abdominal pain, anorexia, nausea, vomiting, diarrhea  or change in bowel or urinary habits, change in stools or urine, dysuria,hematuria,  rash, arthralgias, visual complaints, headache, numbness weakness or ataxia or problems with walking or coordination,  change in mood/affect or memory.               Objective:   Physical Exam   Filed Vitals:   08/14/14 0917  BP: 118/72  Pulse: 87  Temp: 98.1 F (36.7 C)  TempSrc: Oral  Height: 5' 4.25" (1.632 m)  Weight: 215 lb (97.523 kg)  SpO2: 96%     Obese bf   HEENT: nl dentition, turbinates, and orophanx. Nl external ear canals without cough reflex   NECK :  without JVD/Nodes/TM/ nl carotid upstrokes bilaterally   LUNGS: no acc muscle use, clear to A and P bilaterally without cough on insp or exp maneuvers   CV:  RRR  no s3 or murmur or increase in P2, no edema , neg calf pain   ABD:  soft and nontender with  nl excursion in the supine position. No bruits or organomegaly, bowel sounds nl  MS:  warm without deformities, calf tenderness, cyanosis or clubbing  SKIN: warm and dry without lesions    NEURO:  alert, approp, no deficits    I personally reviewed images and agree with radiology impression as follows:  CXR:  06/21/14  Cardiac shadow is stable. Some bibasilar scarring is again seen. No focal infiltrate or sizable effusion is noted. Mild degenerative changes of the thoracic spine are seen  Labs ordered/  reviewed    Lab 07/27/14 1039  NA 138  K 3.9  CL 102  CO2 30  BUN 9  CREATININE 0.90  GLUCOSE 99    Recent Labs Lab 07/27/14 1039  HGB 14.2  HCT 42.3  WBC 8.4  PLT 385.0     Lab Results  Component Value Date   TSH 1.45 07/27/2014     Lab Results  Component Value Date   PROBNP 18.0 07/27/2014     . Lab Results  Component Value Date   DDIMER 0.51* 07/27/2014           Assessment:

## 2014-08-14 NOTE — Progress Notes (Signed)
Chart and xrays/ labs reviewed/ note reviewed and plan discussed with Rexene Edison NP and  I agree with A/P

## 2014-08-14 NOTE — Addendum Note (Signed)
Addended by: Clayborne Dana C on: 08/14/2014 10:37 AM   Modules accepted: Orders

## 2014-08-14 NOTE — Assessment & Plan Note (Signed)
Extensive workup showing  ?CTEPH workup  >>Lab work showed a minimally elevated d-dimer. 2-D echo done in March of this year showed moderately dilated right ventricle and right atrium with pulmonary artery pressures around 37 .EF at 28-31%, grade 1 diastolic dysfunction.  . Patient did desaturate at last visit. VQ scan showed high probability. Subsequent CT and she had a chest showed no evidence of pulmonary embolism with right middle lobe and lingular scarring versus atelectasis. A repeat echo with bubble study showed no shunt with pulmonary artery pressures at 42. Pt is on numerous sedating rx, which suspect is dangerous combination with potential resp issues.  Set up for ONO , begin O2 with act  Need full PFT on return    Plan  Set up for venous doppler bilaterally .  Set up for ONO  Follow up Dr. Melvyn Novas  In 2 weeks with PFT  Begin Oxygen 2l/m with activity .  Please contact office for sooner follow up if symptoms do not improve or worsen or seek emergency care  NUMBER ONE GOAL IS TO QUIT SMOKING  ABSOLUTELY NO SMOKING AROUND OXYGEN OR IN HO

## 2014-08-14 NOTE — Assessment & Plan Note (Signed)
Controlled  Avoid nonselective BB if possible  follow up with PCP for future rx for bystolic

## 2014-08-14 NOTE — Addendum Note (Signed)
Addended by: Oscar La R on: 08/14/2014 10:28 AM   Modules accepted: Orders

## 2014-08-14 NOTE — Patient Instructions (Addendum)
Set up for venous doppler bilaterally .  Set up for ONO  Follow up Dr. Melvyn Novas  In 2 weeks with PFT  Begin Oxygen 2l/m with activity .  Please contact office for sooner follow up if symptoms do not improve or worsen or seek emergency care  NUMBER ONE GOAL IS TO QUIT SMOKING  ABSOLUTELY NO SMOKING AROUND OXYGEN OR IN HOUSE.

## 2014-08-14 NOTE — Telephone Encounter (Signed)
Gloria Lewis called back. Insurance requires the pt's sats state- 2lpm while ambulating. Note updated in "patient care coordination note". Gloria Lewis verbalized understanding and denied any further questions or concerns at this time.

## 2014-08-14 NOTE — Assessment & Plan Note (Signed)
Exertional Hypoxia >begin o2 ? Nocturnal desats  Says previous sleep study was neg , may need repeat if ONO is positive.  Neg CTA  VQ was positive ? CTEPH  Look for DVTs  May need referral to [facility] for CTEPH evaluation /tx   Plan  Set up for venous doppler bilaterally .  Set up for ONO  Follow up Dr. Melvyn Novas  In 2 weeks with PFT  Begin Oxygen 2l/m with activity .  Please contact office for sooner follow up if symptoms do not improve or worsen or seek emergency care  NUMBER ONE GOAL IS TO QUIT SMOKING  ABSOLUTELY NO SMOKING AROUND OXYGEN OR IN HO

## 2014-08-14 NOTE — Telephone Encounter (Signed)
LM for Jason-[facility] x 1

## 2014-08-16 ENCOUNTER — Telehealth: Payer: Self-pay | Admitting: Adult Health

## 2014-08-16 NOTE — Telephone Encounter (Signed)
Called and spoke to pt. Pt stated she is missing a piece that allows her to use the tanks. Pt stated she call [place] and was informed to come to [facility] but pt stated she cannot because she is not able to use her tanks to leave the house. I called [facility] and informed rep of the issue and they are having a rep go out to pt's home to re-educate and see what else may be needed--called and informed pt. Pt verbalized understanding and denied any further questions or concerns at this time.

## 2014-08-22 ENCOUNTER — Ambulatory Visit (HOSPITAL_COMMUNITY): Payer: Medicaid Other | Attending: Cardiovascular Disease

## 2014-08-22 DIAGNOSIS — R0602 Shortness of breath: Secondary | ICD-10-CM

## 2014-08-22 DIAGNOSIS — Z720 Tobacco use: Secondary | ICD-10-CM | POA: Insufficient documentation

## 2014-08-22 DIAGNOSIS — I10 Essential (primary) hypertension: Secondary | ICD-10-CM | POA: Diagnosis not present

## 2014-08-22 DIAGNOSIS — R0902 Hypoxemia: Secondary | ICD-10-CM | POA: Diagnosis not present

## 2014-08-22 NOTE — Addendum Note (Signed)
Addended by: Parke Poisson E on: 08/22/2014 10:40 AM   Modules accepted: Orders, Medications

## 2014-08-24 ENCOUNTER — Telehealth: Payer: Self-pay | Admitting: Internal Medicine

## 2014-08-24 NOTE — Telephone Encounter (Signed)
Pt aware of rec's per MW.  Nothing further needed.

## 2014-08-24 NOTE — Telephone Encounter (Signed)
Do you have any concerns about the medication she is hesitant to take?

## 2014-08-24 NOTE — Telephone Encounter (Signed)
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  Pt is concerned that this is an antidepressant but was given for shoulder pain.  Wants to know if this medication is ok to take with her lung problems.  Pt also believes that she has a mildew in her bedroom- notes increased coughing and runny nose when in her bedroom and kitchen.  States you can physically see the mold under the paint.  Pt is wanting to know if this could be worsening her breathing.  States if MW feels like this is worsening her breathing, she will need to call the city to have her home evaluated so she can move apartments.  MW please advise.  Thanks!

## 2014-08-24 NOTE — Telephone Encounter (Signed)
She is not allergic to mold by the studies we did but certainly it's a concern and should notify the landlord (even if it's the city ) re the problem she's seeing

## 2014-08-24 NOTE — Telephone Encounter (Signed)
Sorry forgot to answer that one :  NO

## 2014-08-27 ENCOUNTER — Telehealth: Payer: Self-pay | Admitting: Internal Medicine

## 2014-08-27 NOTE — Telephone Encounter (Signed)
Called and spoke to pt. Informed her of the recs per MW. Pt has appt with MW on 5/27. Pt stated she will keep appt that she has and if needed she will call back if she feels the s/s have worsened and needs to be worked in sooner with TP. Nothing further needed at this time.

## 2014-08-27 NOTE — Telephone Encounter (Signed)
Spoke with pt, c/o intermittent sharp shooting pains in middle of chest since this weekend.  Started Maprotiline 75mg  through Dr. Manuella Ghazi, who told pt to contact us about the chest pain to see if she should continue taking this medication.  Pt states this chest pain began after starting the new medication.     Denies cough, chest or sinus congestion.   Also c/o R sided shoulder pain which has been worsening Xseveral months- pt is unsure if the two are related.    MW please advise on the above.  Thanks!

## 2014-08-27 NOTE — Telephone Encounter (Signed)
Will need ov with all meds in hand  I don't have any info on maprotiline, suggest pt contact Dr Manuella Ghazi to discuss symptoms

## 2014-08-29 ENCOUNTER — Telehealth (HOSPITAL_COMMUNITY): Payer: Self-pay

## 2014-08-29 NOTE — Telephone Encounter (Signed)
Telephone call with patient wanting to inform Dr. Casimiro Needle she had been to a lot of different providers recently and ended up in the hospital for medical complications with breathing and cardiovascular problems.  States she is getting better but confused about medications some as she wanted to verify Dr. Karen Chafe current orders.  Patient stated plan to bring in all medications she is currently taking when she returns to see Dr. Casimiro Needle on 09/07/14 and will call back if any problems prior to that time.

## 2014-08-31 ENCOUNTER — Encounter: Payer: Self-pay | Admitting: Internal Medicine

## 2014-08-31 ENCOUNTER — Ambulatory Visit: Payer: Self-pay | Admitting: Internal Medicine

## 2014-08-31 ENCOUNTER — Ambulatory Visit (HOSPITAL_COMMUNITY)
Admission: RE | Admit: 2014-08-31 | Discharge: 2014-08-31 | Disposition: A | Discharge status: Home / Self Care | Payer: Medicaid Other | Source: Ambulatory Visit | Attending: Adult Health | Admitting: Adult Health

## 2014-08-31 ENCOUNTER — Ambulatory Visit (INDEPENDENT_AMBULATORY_CARE_PROVIDER_SITE_OTHER): Payer: Medicaid Other | Admitting: Internal Medicine

## 2014-08-31 DIAGNOSIS — R0602 Shortness of breath: Secondary | ICD-10-CM | POA: Diagnosis present

## 2014-08-31 DIAGNOSIS — J9611 Chronic respiratory failure with hypoxia: Secondary | ICD-10-CM

## 2014-08-31 DIAGNOSIS — I10 Essential (primary) hypertension: Secondary | ICD-10-CM | POA: Diagnosis not present

## 2014-08-31 DIAGNOSIS — J449 Chronic obstructive pulmonary disease, unspecified: Secondary | ICD-10-CM

## 2014-08-31 DIAGNOSIS — Z720 Tobacco use: Secondary | ICD-10-CM

## 2014-08-31 DIAGNOSIS — I270 Primary pulmonary hypertension: Secondary | ICD-10-CM

## 2014-08-31 DIAGNOSIS — F17210 Nicotine dependence, cigarettes, uncomplicated: Secondary | ICD-10-CM

## 2014-08-31 DIAGNOSIS — I272 Pulmonary hypertension, unspecified: Secondary | ICD-10-CM

## 2014-08-31 DIAGNOSIS — R0902 Hypoxemia: Secondary | ICD-10-CM

## 2014-08-31 LAB — PULMONARY FUNCTION TEST
DL/VA % PRED: 92 %
DL/VA: 4.71 ml/min/mmHg/L
DLCO UNC: 15.8 ml/min/mmHg
DLCO unc % pred: 57 %
FEF 25-75 POST: 2.22 L/s
FEF 25-75 Pre: 1.95 L/sec
FEF2575-%CHANGE-POST: 14 %
FEF2575-%Pred-Post: 95 %
FEF2575-%Pred-Pre: 83 %
FEV1-%CHANGE-POST: 3 %
FEV1-%PRED-PRE: 77 %
FEV1-%Pred-Post: 80 %
FEV1-PRE: 1.84 L
FEV1-Post: 1.91 L
FEV1FVC-%Change-Post: 3 %
FEV1FVC-%Pred-Pre: 103 %
FEV6-%Change-Post: 0 %
FEV6-%PRED-POST: 77 %
FEV6-%Pred-Pre: 76 %
FEV6-POST: 2.24 L
FEV6-Pre: 2.22 L
FEV6FVC-%Change-Post: 0 %
FEV6FVC-%Pred-Post: 103 %
FEV6FVC-%Pred-Pre: 102 %
FVC-%CHANGE-POST: 0 %
FVC-%Pred-Post: 74 %
FVC-%Pred-Pre: 74 %
FVC-POST: 2.24 L
FVC-Pre: 2.23 L
POST FEV6/FVC RATIO: 100 %
PRE FEV6/FVC RATIO: 100 %
Post FEV1/FVC ratio: 85 %
Pre FEV1/FVC ratio: 83 %
RV % pred: 83 %
RV: 1.72 L
TLC % pred: 74 %
TLC: 4.07 L

## 2014-08-31 MED ORDER — ALBUTEROL SULFATE (2.5 MG/3ML) 0.083% IN NEBU
2.5000 mg | INHALATION_SOLUTION | Freq: Once | RESPIRATORY_TRACT | Status: AC
  Administered 2014-08-31: 2.5 mg via RESPIRATORY_TRACT

## 2014-08-31 NOTE — Patient Instructions (Addendum)
No need for 02 at all with exercise at this point   Please see patient coordinator before you leave today  to schedule overnight oximetry RA  We will set you up for follow up with Dr [name] next available to clear you for shoulder surgery

## 2014-08-31 NOTE — Progress Notes (Signed)
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 Why sob /cp while on inderol neg cards w/u completed 06/24/14  >continued inderol  >   Dr Dema Severin started symbicort post admit but no improvement   so referred by Dr Dema Severin to pulmonary clinic 07/27/2014     History of Present Illness  07/27/2014 1st [facility] office visit/ Gloria Lewis   Chief Complaint  Patient presents with  . Advice Only    Dyspnea per Dr.White; SOB w/activity longer than 2 months; chest tightness and CP off and on;   on symbicort x 2 weeks no better because still can't do steps since March 17 admit  and by 1 pm freq needs saba  even if sitting still plus definitely having noct "wheeze after lie down"  rec   Stop inderol and start bystolic 10 mg twice  daily instead  Continue symbicort 160 Take 2 puffs first thing in am and then another 2 puffs about 12 hours later.  Work on inhaler technique    08/14/2014 NP  Follow up and Med Review  Patient returns for a two-week follow-up and medication review We reviewed all her medications organize them into a medication calendar with patient education Last visit. Patient was seen for initial pulmonary consultation for dyspnea. Lab work showed a minimally elevated d-dimer.  2-D  echo done in March of this year showed moderately dilated right ventricle and right atrium with pulmonary artery pressures around 37 . EF at 03-50%, grade 1 diastolic dysfunction.  .Patient did desaturate at last visit. VQ scan showed high probability. Subsequent CTa  and she had a chest showed no evidence of pulmonary embolism with right middle lobe and lingular scarring versus atelectasis. A repeat  echo with bubble study showed no shunt with pulmonary artery pressures at 42. Complains on DOE with walking , swimming, inclines and stairs.  No exertional chest pain . Dypsnea with anxiety attacks.  rec Set up for venous doppler bilaterally .  Set up for ONO  Follow up Dr. Melvyn Novas  In 2 weeks with PFT  Begin Oxygen 2l/m with activity .    08/31/2014 f/u ov/Gloria Lewis re: GOLD O copd / still smoking  Chief Complaint  Patient presents with  . Follow-up    Pt states her breathing has improved some with starting on o2.   had been swimming up to 4 x a week prior to admit in March 2016 and has not re-attempted  Walks ok at nl pace as long as not carrying anything heavier than 02 / flat surface   No obvious day to day or daytime variabilty or assoc chronic cough or cp or chest tightness, subjective wheeze overt sinus or hb symptoms. No unusual exp hx or h/o childhood pna/ asthma or knowledge of premature birth.  Sleeping ok without nocturnal  or early am exacerbation  of respiratory  c/o's or need for noct saba. Also denies any obvious fluctuation of symptoms with weather or environmental changes or other aggravating or alleviating factors except as outlined above   Current Medications, Allergies, Complete Past Medical History, Past Surgical History, Family History, and Social History were  reviewed in [facility] record.  ROS  The following are not active complaints unless bolded sore throat, dysphagia, dental problems, itching, sneezing,  nasal congestion or excess/ purulent secretions, ear ache,   fever, chills, sweats, unintended wt loss, pleuritic or exertional cp, hemoptysis,  orthopnea pnd or leg swelling, presyncope, palpitations, heartburn, abdominal pain, anorexia, nausea, vomiting, diarrhea  or change in bowel or urinary habits, change in stools or urine, dysuria,hematuria,  rash, arthralgias, visual complaints, headache, numbness weakness or ataxia or problems with  walking or coordination,  change in mood/affect or memory.                         Objective:   Physical Exam     Wt Readings from Last 3 Encounters:  08/31/14 219 lb (99.338 kg)  08/14/14 215 lb (97.523 kg)  07/27/14 212 lb (96.163 kg)    Vital signs reviewed    Obese bf nad  HEENT: nl dentition, turbinates, and orophanx. Nl external ear canals without cough reflex   NECK :  without JVD/Nodes/TM/ nl carotid upstrokes bilaterally   LUNGS: no acc muscle use, clear to A and P bilaterally without cough on insp or exp maneuvers   CV:  RRR  no s3 or murmur or increase in P2, no edema , neg calf pain   ABD:  soft and nontender with nl excursion in the supine position. No bruits or organomegaly, bowel sounds nl  MS:  warm without deformities, calf tenderness, cyanosis or clubbing  SKIN: warm and dry without lesions    NEURO:  alert, approp, no deficits    I personally reviewed images and agree with radiology impression as follows:  CXR:  08/03/14 Linear areas of scarring in the lower lungs bilaterally. No acute findings.  Labs  reviewed    Lab 07/27/14 1039  NA 138  K 3.9  CL 102  CO2 30  BUN 9  CREATININE 0.90  GLUCOSE 99    Recent Labs Lab 07/27/14 1039  HGB 14.2  HCT 42.3  WBC 8.4  PLT 385.0     Lab Results  Component Value Date   TSH 1.45 07/27/2014     Lab Results  Component Value Date   PROBNP 18.0 07/27/2014     . Lab Results  Component Value Date   DDIMER 0.51* 07/27/2014           Assessment:

## 2014-09-02 DIAGNOSIS — J449 Chronic obstructive pulmonary disease, unspecified: Secondary | ICD-10-CM | POA: Insufficient documentation

## 2014-09-02 NOTE — Assessment & Plan Note (Signed)
Changed propranolol to bystolic  7/61/4709 >>>    She is better s significant airflow obstruction off propranolol and Adequate control on present rx, reviewed > no change in rx needed

## 2014-09-02 NOTE — Assessment & Plan Note (Addendum)
-   PFTs 08/31/14  FEV1   1.91 (80%) ratio 85 and dlco 57 corrects to 92% while on symbiocort and off inderal      rec no change symbicort/ keep working on stop smoking (see sep a/p)

## 2014-09-02 NOTE — Assessment & Plan Note (Signed)
07/27/2014 Walked RA x 3 laps @ 185 ft each stopped due to end of study with nl pace, sats at very end down to 83% - 08/31/2014  Walked RA x 3 laps @ 185 ft each stopped due to end of study, nl pace/ slt dizzy but min sob, no desat   - ono RA oredered 09/02/2014 >>>     She is clearly improving off inderal, on symbicort despite still smoking > ok to just use 02 at hs for now

## 2014-09-02 NOTE — Assessment & Plan Note (Signed)

## 2014-09-02 NOTE — Assessment & Plan Note (Addendum)
h/o crack cocaine use remotely Echo 06/22/14 Abnormal septal motion The cavity size was normal. There was mild concentric hypertrophy. Systolic function was normal. The estimated ejection fraction was in the range of 50% to 55%. Wall motion was normal; there were no regional wall motion abnormalities. Doppler parameters are consistent with abnormal left ventricular relaxation (grade 1 diastolic dysfunction). - Right ventricle: The cavity size was moderately dilated. Wall thickness was normal. - Right atrium: The atrium was moderately dilated. - Atrial septum: No defect or patent foramen ovale was identified. - Tricuspid valve: There was moderate regurgitation. - Pulmonary arteries: PA peak pressure: 37 mm Hg  07/27/2014  Walked RA x 3 laps @ 185 ft each stopped due to end of study with nl pace, sats at very end down to 83% - V/Q 08/03/2014  High prob  > CTa rec 08/03/2014 > No evidence of pulmonary embolism. Right middle lobe and lingular scarring versus atelectasis. - Repeat Echo with bubble 08/10/2014 >  No shunt, PAS 42  - PFTs 08/31/14  FEV1   1.91 (80%) ratio 85 and dlco 57 corrects to 92%    I had an extended discussion with the patient reviewing all relevant studies completed to date and  Lasting 25 minutes of a 45 minute visit on the following ongoing concerns:  1) she's clearly better clinically though have not excluded TEPAH here and now requesting clearance for shouldersurgery   2) we had ordered venous dopplers previously so they need to be completed prior to considering surgery  3) not anxious to go to [facility] to sort out issue of [place] so will consult Dr [name] for ? preop clearance?  4) Each maintenance medication was reviewed in detail including most importantly the difference between maintenance and as needed and under what circumstances the prns are to be used.  Please see instructions for details which were reviewed in writing and the patient given a copy.

## 2014-09-05 ENCOUNTER — Telehealth: Payer: Self-pay | Admitting: Internal Medicine

## 2014-09-05 NOTE — Telephone Encounter (Signed)
ATC pt - went directly to VM. lmomtcb for pt

## 2014-09-06 NOTE — Telephone Encounter (Signed)
Called and spoke to pt. Pt questioned if she needs to continue wearing her nocturnal O2 until her ONO. Advised pt the ONO will need to be wihtout O2 but she will need to continue with the nocturnal O2 until we get the results of the ONO. Pt verbalized understanding and denied any further questions or concerns at this time.

## 2014-09-07 ENCOUNTER — Ambulatory Visit (INDEPENDENT_AMBULATORY_CARE_PROVIDER_SITE_OTHER): Payer: Self-pay | Admitting: Psychiatry

## 2014-09-07 ENCOUNTER — Encounter (HOSPITAL_COMMUNITY): Payer: Self-pay | Admitting: Psychiatry

## 2014-09-07 ENCOUNTER — Telehealth: Payer: Self-pay | Admitting: Internal Medicine

## 2014-09-07 VITALS — BP 130/78 | HR 82 | Ht 66.0 in | Wt 226.6 lb

## 2014-09-07 DIAGNOSIS — F331 Major depressive disorder, recurrent, moderate: Secondary | ICD-10-CM

## 2014-09-07 DIAGNOSIS — F339 Major depressive disorder, recurrent, unspecified: Secondary | ICD-10-CM

## 2014-09-07 MED ORDER — DIAZEPAM 5 MG PO TABS: ORAL_TABLET | ORAL | Status: DC

## 2014-09-07 MED ORDER — ZOLPIDEM TARTRATE ER 12.5 MG PO TBCR: 12.5000 mg | EXTENDED_RELEASE_TABLET | Freq: Every evening | ORAL | Status: DC | PRN

## 2014-09-07 MED ORDER — NORTRIPTYLINE HCL 10 MG PO CAPS: 10.0000 mg | ORAL_CAPSULE | Freq: Every day | ORAL | Status: DC

## 2014-09-07 MED ORDER — LEVOMILNACIPRAN HCL ER 40 MG PO CP24: 40.0000 mg | ORAL_CAPSULE | Freq: Every day | ORAL | Status: DC

## 2014-09-07 NOTE — Telephone Encounter (Signed)
Pt will cal back- was unable to talk when I called her.

## 2014-09-07 NOTE — Progress Notes (Signed)
[facility] MD Progress Note  09/07/2014 10:25 AM Gloria Lewis  MRN:  [id number] Subjective: Doing great The patient has had a number of medical issues that seem to be resolved. She presently is on nasal O2 for reasons that are not clear. She sees her pulmonologist Dr. Para Skeans soon. It is not clear what her lung condition is. She had a CT of her chest which was negative and they're still trying to figure out what GI/gastroenterology problem she has. She's not all that symptomatic. The patient does have some other medical problems coming up including bladder surgery and her right shoulder apparently needs to be operated on soon. But emotionally and psychologically the patient is doing well her mood is great. She says she's better than she was when she saw me last. She thinks is probably related to the fact that she feels more confident that she's not seriously physically ill. She is yet to move from a bigger placed or a small placement-is looking forward to it. The patient is sleeping and eating very well. She's got good energy and concentrate well. She just finished her class and has 2 more classes to go at [employer] to get a psychology degree. The patient feels good sense of worth she is not suicidal. The patient denies the use of alcohol or drugs. She's been clean of any substances for over 12 years. Overall the patient is actually doing well. She is demonstrating resilience. Principal Problem: Major depression, recurrent episode Diagnosis:   Patient Active Problem List   Diagnosis Date Noted  . COPD GOLD 0 still smoking  [J44.9] 09/02/2014  . Pulmonary hypertension [I27.0] 08/31/2014  . Respiratory failure with hypoxia [J96.91] 08/14/2014  . Cigarette smoker [Z72.0] 07/28/2014  . Major depressive disorder, recurrent episode, moderate [F33.1] 07/06/2014  . Essential hypertension [I10]   . SOB (shortness of breath) [R06.02] 06/21/2014  . Precordial pain [R07.2] 06/21/2014  . GERD (gastroesophageal  reflux disease) [K21.9] 06/21/2014  . Chest pain [R07.9] 06/21/2014  . HTN (hypertension) [I10]   . Neck pain [M54.2] 01/08/2014  . Major depressive disorder, recurrent episode, severe, without mention of psychotic behavior [F33.2] 10/14/2012  . Schizoaffective disorder [F25.9] 05/26/2012  . 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 Hyperparathyroidism, primary [E21.0] 10/06/2010  . DEGENERATIVE DISC DISEASE, LUMBOSACRAL SPINE [M51.37] 05/21/2007  . DERMATOPHYTOSIS OF THE BODY [B35.4] 05/10/2007  . 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 Anxiety state [F41.1] 03/24/2007  . DENTAL PAIN [K08.9] 03/24/2007  . SHOULDER PAIN, LEFT [M25.519] 03/24/2007   Total Time spent with patient: 30 minutes   Past Medical History:  Past Medical History  Diagnosis Date  . HTN (hypertension)   . Bipolar 1 disorder   . OA (osteoarthritis)   . GERD (gastroesophageal reflux disease)   . Hypercholesterolemia   . Fever blister   . HA (headache)   . Colon polyp   . CTS (carpal tunnel syndrome)   . Schizo-affective psychosis   . Anxiety   . Depression   . Migraines     Past Surgical History  Procedure Laterality Date  . Neck surgery  2009  . Carpal tunnel release  20110 rt/lt  . Polp removed  2011  . Back injection    . 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 Anterior cervical decomp/discectomy fusion  08/27/2011    Procedure: ANTERIOR CERVICAL DECOMPRESSION/DISCECTOMY FUSION 1 LEVEL/HARDWARE REMOVAL;  Surgeon: Eustace Moore, MD;  Location: [facility];  Service: Neurosurgery;  Laterality: Bilateral;  Cervical four-five Anterior cervical decompression/diskectomy, fusion, Plate, Removal of Cervical five-seven Plate  . Hemorrhoid surgery     Family History:  Family History  Problem Relation Age of Onset  . Coronary artery disease Father   . Hypertension Mother   . Schizophrenia Mother   . Depression Brother   . Prostate cancer Brother   .  Anesthesia problems Neg Hx   . Hypotension Neg Hx   . Malignant hyperthermia Neg Hx   . Pseudochol deficiency Neg Hx   . Cancer Father     head neck    Social History:  History  Alcohol Use No     History  Drug Use No    History   Social History  . Marital Status: Single    Spouse Name: N/A  . Number of Children: N/A  . Years of Education: N/A   Social History Main Topics  . Smoking status: Light Tobacco Smoker -- 0.10 packs/day for 30 years    Types: Cigarettes  . Smokeless tobacco: Never Used  . Alcohol Use: No  . Drug Use: No  . Sexual Activity: No   Other Topics Concern  . None   Social History Narrative   Additional History:    Sleep: Good  Appetite:  Good   Assessment:   Musculoskeletal: Strength & Muscle Tone:  Gait & Station:  Patient leans:    Psychiatric Specialty Exam: Physical Exam  ROS  Blood pressure 130/78, pulse 82, height 5\' 6"  (1.676 m), weight 226 lb 9.6 oz (102.785 kg).Body mass index is 36.59 kg/(m^2).  General Appearance: Fairly Groomed  Engineer, water::  Good  Speech:  Clear and Coherent  Volume:  Normal  Mood:  NA  Affect:  Appropriate  Thought Process:  Coherent  Orientation:  Full (Time, Place, and Person)  Thought Content:  WDL  Suicidal Thoughts:  No  Homicidal Thoughts:  No  Memory:  NA  Judgement:  Good  Insight:  Good  Psychomotor Activity:  Normal  Concentration:  Good  Recall:  Good  Fund of Knowledge:Good  Language: Good  Akathisia:  No  Handed:  Right  AIMS (if indicated):     Assets:  Communication Skills  ADL's:  Intact  Cognition: WNL  Sleep:        Current Medications: Current Outpatient Prescriptions  Medication Sig Dispense Refill  . amLODipine (NORVASC) 5 MG tablet Take 5 mg by mouth daily.    Marland Kitchen aspirin EC 81 MG EC tablet Take 1 tablet (81 mg total) by mouth daily. 30 tablet 0  . atorvastatin (LIPITOR) 10 MG tablet Take 10 mg by mouth daily.    . budesonide-formoterol (SYMBICORT) 160-4.5  MCG/ACT inhaler Inhale 2 puffs into the lungs 2 (two) times daily.    . cholecalciferol (VITAMIN D) 1000 UNITS tablet Take 1,000 Units by mouth daily.    . cyclobenzaprine (FLEXERIL) 10 MG tablet Take 10 mg by mouth 3 (three) times daily as needed for muscle spasms.    . diazepam (VALIUM) 5 MG tablet 2 bid 120 tablet 4  . docusate sodium (COLACE) 100 MG capsule Take 100 mg by mouth daily as needed for mild constipation.    . fluticasone (FLONASE) 50 MCG/ACT nasal spray Place 2 sprays into both nostrils daily as needed.   5  . folic acid (FOLVITE) 1 MG tablet Take 1 mg by mouth daily.    . Levomilnacipran HCl ER 40 MG CP24 Take 40 mg by mouth  daily. 30 capsule 5  . loratadine (CLARITIN) 10 MG tablet Take 10 mg by mouth daily as needed.   12  . mirabegron ER (MYRBETRIQ) 50 MG TB24 tablet Take 50 mg by mouth daily as needed.     . Multiple Vitamins-Minerals (MULTIVITAMIN WITH MINERALS) tablet Take 1 tablet by mouth every morning.     . nebivolol (BYSTOLIC) 10 MG tablet Take 1 tablet (10 mg total) by mouth daily. 30 tablet 1  . nortriptyline (PAMELOR) 10 MG capsule Take 1 capsule (10 mg total) by mouth at bedtime. 30 capsule 5  . pantoprazole (PROTONIX) 40 MG tablet Take 40 mg by mouth daily before breakfast.   12  . potassium chloride SA (K-DUR,KLOR-CON) 20 MEQ tablet Take 20 mEq by mouth daily as needed.     . sucralfate (CARAFATE) 1 G tablet Take 1 tablet (1 g total) by mouth 3 (three) times daily with meals. 90 tablet 0  . sulindac (CLINORIL) 200 MG tablet Take 200 mg by mouth 2 (two) times daily.  5  . temazepam (RESTORIL) 15 MG capsule 2 capsules by mouth at bedtime    . topiramate (TOPAMAX) 25 MG capsule Take 25 mg by mouth 2 (two) times daily.  5  . triamterene-hydrochlorothiazide (MAXZIDE-25) 37.5-25 MG per tablet Take 1 tablet by mouth daily.    . valACYclovir (VALTREX) 1000 MG tablet Take 1,000 mg by mouth daily.    . vitamin B-12 (CYANOCOBALAMIN) 1000 MCG tablet Take 1,000 mcg by mouth  daily.    Marland Kitchen zolpidem (AMBIEN CR) 12.5 MG CR tablet Take 1 tablet (12.5 mg total) by mouth at bedtime as needed for sleep. 30 tablet 5   No current facility-administered medications for this visit.    Lab Results: No results found for this or any previous visit (from the past 48 hour(s)).  Physical Findings: AIMS:  , ,  ,  ,    CIWA:    COWS:     Treatment Plan Summary: Medication management For reasons that are not clear at the patient's nortriptyline level did not get increased. She is now taking 10 mg which is okay with me. She's doing so well we'll leave adjust where it is. The patient did have an increase in her Terie Purser now taking 40 mg which I suspect is very helpful. The patient takes Ambien 12.5 mg and she does take Valium 5 mg 2 in the morning and 2 at night. This grouping of medicine seem to be very effective. This patient is functioning very well. She is not suicidal. She doing great in school. She's handling her relationships very well. Her son who is chronically mentally ill has recently declined but the patient seems to be resilient to it. She is allowing him to take over her his care. He actually is psychiatrically hospitalized at this time and the patient seems to be handling it very well. The patient is very distant from her daughter. Overall the patient is striving functioning well  Medical Decision Making:  Self-Limited or Minor (1)     Montoya Watkin IRVING 09/07/2014, 10:25 AM

## 2014-09-10 NOTE — Telephone Encounter (Signed)
Spoke with patient-she states she feels better today-she realized that her working out 3 x weekly and this heat is causing her SOB and fatigued feelings. She states she over did it and had to slow down, take her medications, and use her O2 QHS- once she did this she felt much better. Pt encouraged to call the office anytime with questions or concerns.   Nothing more needed at this time.

## 2014-09-13 ENCOUNTER — Ambulatory Visit: Payer: Medicaid Other | Admitting: Pulmonary Disease

## 2014-09-13 ENCOUNTER — Emergency Department (HOSPITAL_COMMUNITY)
Admission: EM | Admit: 2014-09-13 | Discharge: 2014-09-13 | Disposition: A | Discharge status: Home / Self Care | Payer: Medicaid Other | Attending: Emergency Medicine | Admitting: Emergency Medicine

## 2014-09-13 ENCOUNTER — Encounter (HOSPITAL_COMMUNITY): Payer: Self-pay | Admitting: Emergency Medicine

## 2014-09-13 DIAGNOSIS — Z7951 Long term (current) use of inhaled steroids: Secondary | ICD-10-CM | POA: Insufficient documentation

## 2014-09-13 DIAGNOSIS — Z720 Tobacco use: Secondary | ICD-10-CM | POA: Insufficient documentation

## 2014-09-13 DIAGNOSIS — Z8619 Personal history of other infectious and parasitic diseases: Secondary | ICD-10-CM | POA: Insufficient documentation

## 2014-09-13 DIAGNOSIS — Y998 Other external cause status: Secondary | ICD-10-CM | POA: Diagnosis not present

## 2014-09-13 DIAGNOSIS — S4991XA Unspecified injury of right shoulder and upper arm, initial encounter: Secondary | ICD-10-CM | POA: Insufficient documentation

## 2014-09-13 DIAGNOSIS — Z880 Allergy status to penicillin: Secondary | ICD-10-CM | POA: Insufficient documentation

## 2014-09-13 DIAGNOSIS — I10 Essential (primary) hypertension: Secondary | ICD-10-CM | POA: Insufficient documentation

## 2014-09-13 DIAGNOSIS — Z86010 Personal history of colonic polyps: Secondary | ICD-10-CM | POA: Insufficient documentation

## 2014-09-13 DIAGNOSIS — M1990 Unspecified osteoarthritis, unspecified site: Secondary | ICD-10-CM | POA: Diagnosis not present

## 2014-09-13 DIAGNOSIS — Z7982 Long term (current) use of aspirin: Secondary | ICD-10-CM | POA: Diagnosis not present

## 2014-09-13 DIAGNOSIS — S339XXA Sprain of unspecified parts of lumbar spine and pelvis, initial encounter: Secondary | ICD-10-CM

## 2014-09-13 DIAGNOSIS — F319 Bipolar disorder, unspecified: Secondary | ICD-10-CM | POA: Insufficient documentation

## 2014-09-13 DIAGNOSIS — Y92410 Unspecified street and highway as the place of occurrence of the external cause: Secondary | ICD-10-CM | POA: Diagnosis not present

## 2014-09-13 DIAGNOSIS — K219 Gastro-esophageal reflux disease without esophagitis: Secondary | ICD-10-CM | POA: Diagnosis not present

## 2014-09-13 DIAGNOSIS — Y9389 Activity, other specified: Secondary | ICD-10-CM | POA: Diagnosis not present

## 2014-09-13 DIAGNOSIS — G43909 Migraine, unspecified, not intractable, without status migrainosus: Secondary | ICD-10-CM | POA: Diagnosis not present

## 2014-09-13 DIAGNOSIS — S3992XA Unspecified injury of lower back, initial encounter: Secondary | ICD-10-CM | POA: Diagnosis present

## 2014-09-13 DIAGNOSIS — Z79899 Other long term (current) drug therapy: Secondary | ICD-10-CM | POA: Insufficient documentation

## 2014-09-13 DIAGNOSIS — E780 Pure hypercholesterolemia: Secondary | ICD-10-CM | POA: Diagnosis not present

## 2014-09-13 DIAGNOSIS — S335XXA Sprain of ligaments of lumbar spine, initial encounter: Secondary | ICD-10-CM | POA: Insufficient documentation

## 2014-09-13 MED ORDER — METHOCARBAMOL 500 MG PO TABS: 500.0000 mg | ORAL_TABLET | Freq: Two times a day (BID) | ORAL | Status: DC

## 2014-09-13 MED ORDER — NAPROXEN 500 MG PO TABS: 500.0000 mg | ORAL_TABLET | Freq: Two times a day (BID) | ORAL | Status: DC
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
## 2014-09-17 ENCOUNTER — Telehealth: Payer: Self-pay | Admitting: Pulmonary Disease

## 2014-09-17 NOTE — Telephone Encounter (Signed)
Can't help her over the phone with these issues, needs ov asap with dr [name] and to er in meantime if needed - be sure to bring all meds in for next ov (tammy or I could see her too but this is not ideal as needs eval for [facility])

## 2014-09-17 NOTE — Telephone Encounter (Signed)
lmomtcb x1 for pt 
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
## 2014-09-18 NOTE — Telephone Encounter (Signed)
Called pt and appt scheduled to see MW as an acute visit on Thursday. She will bring all meds with her to OV. Nothing further needed

## 2014-09-19 ENCOUNTER — Telehealth: Payer: Self-pay | Admitting: Internal Medicine

## 2014-09-19 NOTE — Telephone Encounter (Signed)
Per MW- ONO on RA was normal- no o2 needed  Spoke with pt and notified of results per Dr. Melvyn Novas. Pt verbalized understanding and denied any questions.

## 2014-09-20 ENCOUNTER — Ambulatory Visit (INDEPENDENT_AMBULATORY_CARE_PROVIDER_SITE_OTHER): Payer: Medicaid Other | Admitting: Internal Medicine

## 2014-09-20 ENCOUNTER — Telehealth: Payer: Self-pay | Admitting: Internal Medicine

## 2014-09-20 ENCOUNTER — Encounter: Payer: Self-pay | Admitting: Internal Medicine

## 2014-09-20 VITALS — BP 104/80 | HR 87 | Ht 66.25 in | Wt 225.0 lb

## 2014-09-20 DIAGNOSIS — K219 Gastro-esophageal reflux disease without esophagitis: Secondary | ICD-10-CM | POA: Diagnosis not present

## 2014-09-20 DIAGNOSIS — J9611 Chronic respiratory failure with hypoxia: Secondary | ICD-10-CM | POA: Diagnosis not present

## 2014-09-20 DIAGNOSIS — J449 Chronic obstructive pulmonary disease, unspecified: Secondary | ICD-10-CM

## 2014-09-20 DIAGNOSIS — Z720 Tobacco use: Secondary | ICD-10-CM

## 2014-09-20 DIAGNOSIS — E669 Obesity, unspecified: Secondary | ICD-10-CM

## 2014-09-20 DIAGNOSIS — I10 Essential (primary) hypertension: Secondary | ICD-10-CM

## 2014-09-20 DIAGNOSIS — F17210 Nicotine dependence, cigarettes, uncomplicated: Secondary | ICD-10-CM

## 2014-09-20 DIAGNOSIS — I270 Primary pulmonary hypertension: Secondary | ICD-10-CM | POA: Diagnosis not present

## 2014-09-20 DIAGNOSIS — I272 Pulmonary hypertension, unspecified: Secondary | ICD-10-CM

## 2014-09-20 NOTE — Progress Notes (Signed)
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 Why sob /cp while on inderol neg cards w/u completed 06/24/14  >continued inderol  >   Dr Dema Severin started symbicort post admit but no improvement   so referred by Dr Dema Severin to pulmonary clinic 07/27/2014     History of Present Illness  07/27/2014 [facility] office visit/ Arin Peral   Chief Complaint  Patient presents with  . Advice Only    Dyspnea per Dr.White; SOB w/activity longer than 2 months; chest tightness and CP off and on;   on symbicort x 2 weeks no better because still can't do steps since March 17 admit  and by 1 pm freq needs saba  even if sitting still plus definitely having noct "wheeze after lie down"  rec   Stop inderol and start bystolic 10 mg twice  daily instead  Continue symbicort 160 Take 2 puffs first thing in am and then another 2 puffs about 12 hours later.  Work on inhaler technique    08/14/2014 NP  Follow up and Med Review  Patient returns for a two-week follow-up and medication review We reviewed all her medications organize them into a medication calendar with patient education Last visit. Patient was seen for initial pulmonary consultation for dyspnea. Lab work showed a minimally elevated d-dimer.  2-D  echo done in March of this year showed moderately dilated right ventricle and right atrium with pulmonary artery pressures around 37 . EF at 19-14%, grade 1 diastolic dysfunction.  .Patient did desaturate at last visit. VQ scan showed high probability. Subsequent CTa  and she had a chest showed no evidence of pulmonary embolism with right middle lobe and lingular scarring versus atelectasis. A repeat  echo with bubble study showed no shunt with pulmonary artery pressures at 42. Complains on DOE with walking , swimming, inclines and stairs.  No exertional chest pain . Dypsnea with anxiety attacks.  rec Set up for venous doppler bilaterally .  Set up for ONO  Follow up Dr. Melvyn Novas  In 2 weeks with PFT  Begin Oxygen 2l/m with activity .    08/31/2014 f/u ov/Samuele Storey re: GOLD O copd / still smoking  Chief Complaint  Patient presents with  . Follow-up    Pt states her breathing has improved some with starting on o2.   had been swimming up to 4 x a week prior to admit in March 2016 and has not re-attempted  Walks ok at nl pace as long as not carrying anything heavier than 02 / flat surface rec No need for 02 at all with exercise at this point  Please see patient coordinator before you leave today  to schedule overnight oximetry RA> no desats   09/20/2014 f/u ov/Corry Storie re: PH/ prev crack use/  almost quit smoking  Chief Complaint  Patient presents with  . Follow-up    Breathing is unchanged. She c/o "waking up with alot of acid reflux"- chest feels heavy.     Body mass index is 36.03 kg/(m^2).    Walk x 15 m s stopping  Some up hills/ no sob but fatigue stops her  first  Main problems is can't sleep due to overt hb/ to see GI 09/21/14     No obvious day to day or daytime variabilty or assoc chronic cough or cp or chest tightness, subjective wheeze overt sinus  symptoms. No unusual exp hx or h/o childhood pna/ asthma or knowledge of premature birth.   Also denies any obvious fluctuation of symptoms with weather or environmental changes or other aggravating or alleviating factors except as outlined above   Current Medications, Allergies, Complete Past Medical History, Past Surgical History, Family History, and Social History were reviewed in [employer] record.  ROS  The following are not active complaints unless bolded sore throat, dysphagia, dental problems, itching,  sneezing,  nasal congestion or excess/ purulent secretions, ear ache,   fever, chills, sweats, unintended wt loss, pleuritic or exertional cp, hemoptysis,  orthopnea pnd or leg swelling, presyncope, palpitations, heartburn, abdominal pain, anorexia, nausea, vomiting, diarrhea  or change in bowel or urinary habits, change in stools or urine, dysuria,hematuria,  rash, arthralgias, visual complaints, headache, numbness weakness or ataxia or problems with walking or coordination,  change in mood/affect or memory.           Objective:   Physical Exam    09/20/2014       225  Wt Readings from Last 3 Encounters:  08/31/14 219 lb (99.338 kg)  08/14/14 215 lb (97.523 kg)  07/27/14 212 lb (96.163 kg)    Vital signs reviewed    Obese bf nad  HEENT: nl dentition, turbinates, and orophanx. Nl external ear canals without cough reflex   NECK :  without JVD/Nodes/TM/ nl carotid upstrokes bilaterally   LUNGS: no acc muscle use, clear to A and P bilaterally without cough on insp or exp maneuvers   CV:  RRR  no s3 or murmur or increase in P2, no edema , neg calf pain   ABD:  soft and nontender with nl excursion in the supine position. No bruits or organomegaly, bowel sounds nl  MS:  warm without deformities, calf tenderness, cyanosis or clubbing  SKIN: warm and dry without lesions    NEURO:  alert, approp, no deficits    I personally reviewed images and agree with radiology impression as follows:  CXR:  08/03/14 Linear areas of scarring in the lower lungs bilaterally. No acute findings.  Labs  reviewed    Lab 07/27/14 1039  NA 138  K 3.9  CL 102  CO2 30  BUN 9  CREATININE 0.90  GLUCOSE 99    Recent Labs Lab 07/27/14 1039  HGB 14.2  HCT 42.3  WBC 8.4  PLT 385.0     Lab Results  Component Value Date   TSH 1.45 07/27/2014     Lab Results  Component Value Date   PROBNP 18.0 07/27/2014     . Lab Results  Component Value Date   DDIMER 0.51* 07/27/2014            Assessment:

## 2014-09-20 NOTE — Telephone Encounter (Signed)
yes

## 2014-09-20 NOTE — Patient Instructions (Addendum)
Pepcid 20 mg take one at bedtime   GERD (REFLUX)  is an extremely common cause of respiratory symptoms just like yours , many times with no obvious heartburn at all.    It can be treated with medication, but also with lifestyle changes including elevation of the head of your bed (ideally with 6 inch  bed blocks),  Smoking cessation, avoidance of late meals, excessive alcohol, and avoid fatty foods, chocolate, peppermint, colas, red wine, and acidic juices such as orange juice.  NO MINT OR MENTHOL PRODUCTS SO NO COUGH DROPS  USE SUGARLESS CANDY INSTEAD (Jolley ranchers or Stover's or Life Savers) or even ice chips will also do - the key is to swallow to prevent all throat clearing. NO OIL BASED VITAMINS - use powdered substitutes.  Keep appt to see GI/ reschedule to see Dr [name] and no more smoking in meantime - this is the most important aspect of your care!

## 2014-09-20 NOTE — Telephone Encounter (Signed)
Spoke with pt, states that she needs a letter to [employer] to Ms. Clark stating that she no longer needs her 36.  Pt is wanting this letter mailed to her home, verified address on file.  Pt also states she needs an order sent to [facility] to d/c her 02.  I do not see any note of d/c'ing pt's 02 at today's visit with MW, and an order to d/c 02 was not placed.  Dr. Melvyn Novas are you ok with the above requests from pt?

## 2014-09-21 NOTE — Telephone Encounter (Signed)
Order entered for D/C Oxygen.  Letter mailed to patient's home address.  Patient notified. Nothing further needed.

## 2014-09-23 ENCOUNTER — Encounter: Payer: Self-pay | Admitting: Internal Medicine

## 2014-09-23 DIAGNOSIS — E669 Obesity, unspecified: Secondary | ICD-10-CM | POA: Insufficient documentation

## 2014-09-23 NOTE — Assessment & Plan Note (Signed)
Changed propranolol to bystolic  5/87/2761 >>>   Breathing better/ Adequate control on present rx, reviewed > no change in rx needed

## 2014-09-23 NOTE — Assessment & Plan Note (Signed)
07/27/2014 Walked RA x 3 laps @ 185 ft each stopped due to end of study with nl pace, sats at very end down to 83% - 08/31/2014  Walked RA x 3 laps @ 185 ft each stopped due to end of study, nl pace/ slt dizzy but min sob, no desat   - ono RA September 11 2014 > no desat   No longer needs 02 with ex or hs

## 2014-09-23 NOTE — Assessment & Plan Note (Signed)
Body mass index is 36.03 kg/(m^2).  Lab Results  Component Value Date   TSH 1.45 07/27/2014     Stressed neg cal balance > f/u primary care

## 2014-09-23 NOTE — Assessment & Plan Note (Signed)

## 2014-09-23 NOTE — Assessment & Plan Note (Addendum)
Echo 06/22/14 Abnormal septal motion The cavity size was normal. There was mild concentric hypertrophy. Systolic function was normal. The estimated ejection fraction was in the range of 50% to 55%. Wall motion was normal; there were no regional wall motion abnormalities. Doppler parameters are consistent with abnormal left ventricular relaxation (grade 1 diastolic dysfunction). - Right ventricle: The cavity size was moderately dilated. Wall thickness was normal. - Right atrium: The atrium was moderately dilated. - Atrial septum: No defect or patent foramen ovale was identified. - Tricuspid valve: There was moderate regurgitation. - Pulmonary arteries: PA peak pressure: 37 mm Hg  07/27/2014  Walked RA x 3 laps @ 185 ft each stopped due to end of study with nl pace, sats at very end down to 83% - V/Q 08/03/2014  High prob  > CTa rec 08/03/2014 > No evidence of pulmonary embolism. Right middle lobe and lingular scarring versus atelectasis. - Repeat Echo with bubble 08/10/2014 >  No shunt, PAS 42  - PFTs 08/31/14  FEV1   1.91 (80%) ratio 85 and dlco 57 corrects to 92%  - Venous dopplers rec 09/02/2014 >neg bilaterally   Does not appear she has sign enough PH to justify RHC or further w/u at this point but referred to Dr [name] for second opinion

## 2014-09-23 NOTE — Assessment & Plan Note (Signed)
prob related to smoking/ obesity > rec add pepcid 20 mg at bedtime / lifestyle / diet reviewed  Total time counseling = 69m

## 2014-10-02 ENCOUNTER — Ambulatory Visit: Payer: Medicaid Other | Admitting: Pulmonary Disease

## 2014-10-03 ENCOUNTER — Telehealth (HOSPITAL_COMMUNITY): Payer: Self-pay

## 2014-10-03 NOTE — Telephone Encounter (Signed)
Telephone call with Amedeo Gory, pharmacist with [facility] after a message was left wanting to verify if patient was to be taking Restoril and Ambien at the same time.  Informed pharmacist Dr. Casimiro Needle discontinued patient's Restoril on 07/06/14.  Also discussed concerns patient now having problems getting a full prescription for Ambien to go through with Medicaid and may need a new prior authorization.  Pharmacist stated plan to attempt to have medication filled when due on 10/05/14 and will send a prior authorization request if rejected.

## 2014-10-04 ENCOUNTER — Other Ambulatory Visit: Payer: Self-pay | Admitting: Adult Health

## 2014-10-04 NOTE — Telephone Encounter (Signed)
Paper refill request received from [facility] for Bystolic 10mg  QD Pt seen 1.74.71 by TP Bystolic refilled at that visit for #30 with 1 additional  Last refill per request was 6.16.16 Per 5.10.16 ov note: HTN (hypertension) - Melvenia Needles, NP at 08/14/2014 10:23 AM     Status: Written Related Problem: HTN (hypertension)   Expand All Collapse All   Controlled  Avoid nonselective BB if possible  follow up with PCP for future rx for bystolic        Denial written on paper request and faxed back to pharmacy Nothing further needed; will sign off.

## 2014-10-05 ENCOUNTER — Other Ambulatory Visit (HOSPITAL_COMMUNITY): Payer: Self-pay | Admitting: Family Medicine

## 2014-10-05 DIAGNOSIS — Z1231 Encounter for screening mammogram for malignant neoplasm of breast: Secondary | ICD-10-CM

## 2014-10-16 ENCOUNTER — Ambulatory Visit: Payer: Self-pay | Admitting: Neurology

## 2014-10-18 ENCOUNTER — Encounter: Payer: Self-pay | Admitting: Neurology

## 2014-10-18 ENCOUNTER — Ambulatory Visit (INDEPENDENT_AMBULATORY_CARE_PROVIDER_SITE_OTHER): Payer: Medicaid Other | Admitting: Neurology

## 2014-10-18 VITALS — BP 118/64 | HR 70 | Resp 18 | Ht 66.25 in | Wt 228.6 lb

## 2014-10-18 DIAGNOSIS — G43709 Chronic migraine without aura, not intractable, without status migrainosus: Secondary | ICD-10-CM

## 2014-10-18 DIAGNOSIS — Z720 Tobacco use: Secondary | ICD-10-CM | POA: Diagnosis not present

## 2014-10-18 NOTE — Progress Notes (Signed)
NEUROLOGY FOLLOW UP OFFICE NOTE  BRANDON SCARBROUGH [id number]  HISTORY OF PRESENT ILLNESS: Gloria Lewis is a 59 year old right-handed woman with history of hypertension, prior drug and alcohol abuse, major depressive disorder, bipolar disorder, schizoaffective disorder, migraines, tension headache, cervical disc disease status post 2 surgeries, lumbar degenerative disc disease, osteoarthritis, prolapsed bladder, hypercholesterolemia, IBS, and GERD who follows up for chronic migraines. Hospital notes, labs and CTA of chest reviewed.   UPDATE: As per hospital notes from March, she was admitted to the hospital for chest pain.  Cardiac etiology ruled out.  Image of CTA of chest were reviewed and revealed no PE.  She was advised to discontinue nortriptyline. Intensity:  8/10 Duration:  3-4 hours  Frequency:  4 days/week Current abortive therapy:  Excedrin, cyclobenzaprine 10mg  Current preventative therapy:  Cymbalta 30mg   (just started 2 weeks ago) Other therapy:  bath, swimming Other current medications:  fluoxetine, N82, MVI, folic acid, omeprazole  Caffeine:  no Alcohol:  no Smoker:  actively trying to quit. Diet:  good Exercise:  swims Depression/stress:  Increased stress this year due to mental health of her son, which has caused increase in headache Sleep hygiene:  Falls asleep quickly but always wakes up at 4am, but feels refreshed  HISTORY: Onset:  Since childhood, but worse since 2009 Location:  Bi-temporal and at base of her skull, neck pain sometimes radiating down into right shoulder Quality:  Constant severe aching (other daily headaches are throbbing). Initial intensity:  8/10; January 8/10 Aura:  no Prodrome:  no Associated symptoms:  Nausea, photophobia, phonophobia, osmophobia, blurred vision Initial Duration:  1 hour if lays down to rest (otherwise all day); January 3-4 hours Initial Frequency:  3 times a week (but has daily headaches); April 2-3 times a  week Triggers/exacerbating factors:  Stress, sleeping on right side (where she had cervical spine surgery), movement, neck pain Relieving factors:  Laying down to rest in dark room Activity:  Cannot function  Past abortive therapy:  Advil, Aleve, Tylenol, cyclobenzaprine (helped), chlorpromazine HCL 25-50mg , Tramadol Past preventative therapy:  Trigger point injections (helped briefly), topamax (effective but told to get off for unknown reason), Zoloft (used for depression, made headache worse), PT of the neck, Accupuncture, zonisamide 400mg  (ineffective), propranolol (depression), nortriptyline (told by doctors to stop due to chest pain) PAST MEDICAL HISTORY: Past Medical History  Diagnosis Date  . HTN (hypertension)   . Bipolar 1 disorder   . OA (osteoarthritis)   . GERD (gastroesophageal reflux disease)   . Hypercholesterolemia   . Fever blister   . HA (headache)   . Colon polyp   . CTS (carpal tunnel syndrome)   . Schizo-affective psychosis   . Anxiety   . Depression   . Migraines     MEDICATIONS: Current Outpatient Prescriptions on File Prior to Visit  Medication Sig Dispense Refill  . amLODipine (NORVASC) 5 MG tablet Take 5 mg by mouth daily.    Marland Kitchen aspirin EC 81 MG EC tablet Take 1 tablet (81 mg total) by mouth daily. 30 tablet 0  . atorvastatin (LIPITOR) 10 MG tablet Take 10 mg by mouth daily.    . budesonide-formoterol (SYMBICORT) 160-4.5 MCG/ACT inhaler Inhale 2 puffs into the lungs 2 (two) times daily as needed (for shortness of breath and wheezing).     . cholecalciferol (VITAMIN D) 1000 UNITS tablet Take 1,000 Units by mouth daily.    . cyclobenzaprine (FLEXERIL) 10 MG tablet Take 10 mg by mouth 3 (three) times  daily as needed for muscle spasms.    . diazepam (VALIUM) 5 MG tablet 2 bid (Patient taking differently: Take 10 mg by mouth 2 (two) times daily. ) 120 tablet 4  . docusate sodium (COLACE) 100 MG capsule Take 100 mg by mouth daily as needed for mild constipation.     . fluticasone (FLONASE) 50 MCG/ACT nasal spray Place 2 sprays into both nostrils daily as needed for allergies.   5  . folic acid (FOLVITE) 1 MG tablet Take 1 mg by mouth daily.    . Levomilnacipran HCl ER 40 MG CP24 Take 40 mg by mouth daily. (Patient not taking: Reported on 10/18/2014) 30 capsule 5  . loratadine (CLARITIN) 10 MG tablet Take 10 mg by mouth daily as needed for allergies.   12  . methocarbamol (ROBAXIN) 500 MG tablet Take 1 tablet (500 mg total) by mouth 2 (two) times daily. (Patient not taking: Reported on 09/20/2014) 20 tablet 0  . Multiple Vitamins-Minerals (MULTIVITAMIN WITH MINERALS) tablet Take 1 tablet by mouth every morning.     . nebivolol (BYSTOLIC) 10 MG tablet Take 1 tablet (10 mg total) by mouth daily. 30 tablet 1  . nortriptyline (PAMELOR) 10 MG capsule Take 1 capsule (10 mg total) by mouth at bedtime. (Patient not taking: Reported on 10/18/2014) 30 capsule 5  . pantoprazole (PROTONIX) 40 MG tablet Take 40 mg by mouth daily before breakfast.   12  . potassium chloride SA (K-DUR,KLOR-CON) 20 MEQ tablet Take 20 mEq by mouth daily as needed.     . sucralfate (CARAFATE) 1 G tablet Take 1 tablet (1 g total) by mouth 3 (three) times daily with meals. 90 tablet 0  . temazepam (RESTORIL) 15 MG capsule Take 30 mg by mouth at bedtime.     . topiramate (TOPAMAX) 25 MG capsule Take 25 mg by mouth 2 (two) times daily.  5  . triamterene-hydrochlorothiazide (MAXZIDE-25) 37.5-25 MG per tablet Take 1 tablet by mouth daily.    . valACYclovir (VALTREX) 1000 MG tablet Take 1,000 mg by mouth daily as needed (for out breaks).     . vitamin B-12 (CYANOCOBALAMIN) 1000 MCG tablet Take 1,000 mcg by mouth daily.    Marland Kitchen zolpidem (AMBIEN CR) 12.5 MG CR tablet Take 1 tablet (12.5 mg total) by mouth at bedtime as needed for sleep. 30 tablet 5   No current facility-administered medications on file prior to visit.    ALLERGIES: Allergies  Allergen Reactions  . Amoxicillin Itching  . Chantix  [Varenicline Tartrate] Nausea Only  . Effexor [Venlafaxine Hydrochloride] Itching and Other (See Comments)    headache  . Norco [Hydrocodone-Acetaminophen] Itching  . Paroxetine Hcl Other (See Comments)    headache  . Penicillins Hives  . Tramadol     Pt states it interacted with her sertraline, but she is no longer on sertraline.  She does not remember the type of reaction she had.   . Zithromax [Azithromycin Dihydrate] Swelling  . Hydrocodone Other (See Comments)    headache    FAMILY HISTORY: Family History  Problem Relation Age of Onset  . Coronary artery disease Father   . Hypertension Mother   . Schizophrenia Mother   . Depression Brother   . Prostate cancer Brother   . Anesthesia problems Neg Hx   . Hypotension Neg Hx   . Malignant hyperthermia Neg Hx   . Pseudochol deficiency Neg Hx   . Cancer Father     head neck     SOCIAL  HISTORY: History   Social History  . Marital Status: Single    Spouse Name: N/A  . Number of Children: N/A  . Years of Education: N/A   Occupational History  . Not on file.   Social History Main Topics  . Smoking status: Light Tobacco Smoker -- 0.10 packs/day for 30 years    Types: Cigarettes  . Smokeless tobacco: Never Used     Comment: is trying to quit   . Alcohol Use: No  . Drug Use: No  . Sexual Activity: No   Other Topics Concern  . Not on file   Social History Narrative    REVIEW OF SYSTEMS: Constitutional: No fevers, chills, or sweats, no generalized fatigue, change in appetite Eyes: No visual changes, double vision, eye pain Ear, nose and throat: No hearing loss, ear pain, nasal congestion, sore throat Cardiovascular: No chest pain, palpitations Respiratory:  No shortness of breath at rest or with exertion, wheezes GastrointestinaI: No nausea, vomiting, diarrhea, abdominal pain, fecal incontinence Genitourinary:  No dysuria, urinary retention or frequency Musculoskeletal:  No neck pain, back pain Integumentary: No  rash, pruritus, skin lesions Neurological: as above Psychiatric: No depression, insomnia, anxiety Endocrine: No palpitations, fatigue, diaphoresis, mood swings, change in appetite, change in weight, increased thirst Hematologic/Lymphatic:  No anemia, purpura, petechiae. Allergic/Immunologic: no itchy/runny eyes, nasal congestion, recent allergic reactions, rashes  PHYSICAL EXAM: Filed Vitals:   10/18/14 0857  BP: 118/64  Pulse: 70  Resp: 18   General: No acute distress.  Patient appears well-groomed.  Obese. Head:  Normocephalic/atraumatic Eyes:  Fundoscopic exam unremarkable without vessel changes, exudates, hemorrhages or papilledema. Neck: supple, no paraspinal tenderness, full range of motion Heart:  Regular rate and rhythm Lungs:  Clear to auscultation bilaterally Back: No paraspinal tenderness Neurological Exam: alert and oriented to person, place, and time. Attention span and concentration intact, recent and remote memory intact, fund of knowledge intact.  Speech fluent and not dysarthric, language intact.  CN II-XII intact. Fundoscopic exam unremarkable without vessel changes, exudates, hemorrhages or papilledema.  Bulk and tone normal, muscle strength 5/5 throughout.  Sensation to light touch, temperature and vibration intact.  Deep tendon reflexes 2+ throughout, toes downgoing.  Finger to nose and heel to shin testing intact.  Gait normal, Romberg negative.  IMPRESSION: Chronic migraine without aura Tobacco abuse  PLAN: 1.  She has at least 15 headache days per month for well over 3 months.  She has failed or had side effects to several preventative medications.  She is a Botox candidate.  We will set her up for pre-approval.  Information regarding Botox, including side effects, provided. 2.  In meantime, continue Cymbalta 30mg  daily.  Once we get pre-approval for Botox, will have her discontinue it. 3.  Try to limit Excedrin to no more than 2 days out of the week. 4.   Continue on trying to stop smoking and losing weight 5.  Follow up for first round of Botox.  Gloria Clines, DO  CC:  Harlan Stains, MD

## 2014-10-18 NOTE — Patient Instructions (Signed)
1.  Cymbalta 30mg  daily.  Once it is approved, discontinue. 2.  Will try getting pre-approval for Botox. 3.  Try to limit Excedrin to no more than 2 days out of the week

## 2014-10-19 ENCOUNTER — Encounter: Payer: Self-pay | Admitting: Adult Health

## 2014-10-23 ENCOUNTER — Ambulatory Visit: Payer: Medicaid Other | Admitting: Pulmonary Disease

## 2014-10-25 ENCOUNTER — Ambulatory Visit (INDEPENDENT_AMBULATORY_CARE_PROVIDER_SITE_OTHER): Payer: Medicaid Other | Admitting: Pulmonary Disease

## 2014-10-25 ENCOUNTER — Encounter: Payer: Self-pay | Admitting: Pulmonary Disease

## 2014-10-25 VITALS — BP 128/74 | HR 81 | Ht 66.25 in | Wt 227.0 lb

## 2014-10-25 DIAGNOSIS — I270 Primary pulmonary hypertension: Secondary | ICD-10-CM | POA: Diagnosis not present

## 2014-10-25 DIAGNOSIS — I272 Pulmonary hypertension, unspecified: Secondary | ICD-10-CM

## 2014-10-25 NOTE — Progress Notes (Signed)
Subjective:    Patient ID: Gloria Lewis, female    DOB: 1956/01/05, 59 y.o.   MRN: [id number]  HPI Chief Complaint  Patient presents with  . Follow-up    pt here for sx clearance- tenative sx for bone spurs in shoulder.      This is a 59 year old female with a past medical history significant for cocaine abuse as well as tobacco abuse, who is currently actively smoking, who has been referred to my clinic today for a second opinion on pulmonary hypertension. She has a past medical history as listed below. In March 2016 she was admitted to the hospital here in [facility] for chest pain. The description from my cardiology partners note that the chest pain was somewhat escalating over 4 days' time and was positional in nature. She is admitted to the hospital where she was monitored and had an echocardiogram. The echocardiogram showed normal LV function but her right ventricle and right atrium were dilated. She had a nuclear my scan which was interpreted as normal. She had some shortness of breath which persisted after this and she was started on Symbicort. This does not improve her shortness of breath she was referred to my partner in April. Since that time she's had lung function testing which showed no evidence of airflow obstruction but some mild restriction likely related to obesity. She also had a VQ scan which was highly suggestive of a pulmonary embolism but a follow-up CT scan of the chest showed no evidence of a pulmonary embolism. There was some chronic scarring in the right middle lobe noted. She currently has no symptoms of shortness of breath and is feeling well. She states however that from time to time she will feel lightheaded if she goes quickly from the pool after exercise into a sauna. Apart from that she remains active and has no dyspnea. She is capable of running a vacuum cleaner without difficulty. Carrying heavy objects is not difficulty. She has no cough or wheeze. She has cut  back significantly on smoking but is now down to about 1 cigarette a week.  Past Medical History  Diagnosis Date  . HTN (hypertension)   . Bipolar 1 disorder   . OA (osteoarthritis)   . GERD (gastroesophageal reflux disease)   . Hypercholesterolemia   . Fever blister   . HA (headache)   . Colon polyp   . CTS (carpal tunnel syndrome)   . Schizo-affective psychosis   . Anxiety   . Depression   . Migraines       Review of Systems  Constitutional: Negative for fever, chills and fatigue.  HENT: Negative for postnasal drip, rhinorrhea and sinus pressure.   Respiratory: Negative for cough, shortness of breath and stridor.   Cardiovascular: Negative for chest pain, palpitations and leg swelling.       Objective:   Physical Exam Filed Vitals:   10/25/14 1632  BP: 128/74  Pulse: 81  Height: 5' 6.25" (1.683 m)  Weight: 227 lb (102.967 kg)  SpO2: 95%   RA  Gen: well appearing, no acute distress HENT: NCAT, OP clear, neck supple without masses Eyes: PERRL, EOMi Lymph: no cervical lymphadenopathy PULM: CTA B CV: RRR, no mgr, no JVD GI: BS+, soft, nontender, no hsm Derm: no rash or skin breakdown MSK: normal bulk and tone Neuro: A&Ox4, CN II-XII intact, strength 5/5 in all 4 extremities Psyche: normal mood and affect  Images from her CT angiogram from May 2016 were personally reviewed with  there is no evidence of a pulmonary wasn't seen but some very mild groundglass opacification atelectasis in both the lingula and the right middle lobe Her hospital records from March 2016 were reviewed where she was admitted for chest pain and underwent a nuclear stress test which showed no evidence of ischemia       Assessment & Plan:  Pulmonary hypertension I have been asked to evaluate Gloria Lewis for perioperative pulmonary risk for pulmonary hypertension. She's had an extensive workup up until this point which I think was entirely appropriate. The question remains as to whether  or not she had a pulmonary and was him back when she was admitted in March 2016, but all studies performed after hospital discharge have not shown conclusive evidence of this. Her VQ scan was highly suggestive which in my mind raises the possibility that she may have had a pulmonary embolism, but the fact that she has chronic scarring in her right lungs could've altered the results of this test.  Regardless, her echocardiogram on 2 separate occasions has suggested pulmonary hypertension. There she has minimal symptoms she does note lightheadedness from time to time which is a nonspecific finding but in the setting of her right heart dilatation should probably not be ignored.  I explained to her today that I think her overall risk for having a perioperative pulmonary or cardiac complication is low considering her lack of symptoms, but the question remains as to whether or not she has pulmonary hypertension. I think the best approach moving forward would be for her to have a right heart catheterization to try to sort this out.  Plan: We will arrange a right heart catheterization    Current outpatient prescriptions:  .  amLODipine (NORVASC) 5 MG tablet, Take 5 mg by mouth daily., Disp: , Rfl:  .  aspirin EC 81 MG EC tablet, Take 1 tablet (81 mg total) by mouth daily., Disp: 30 tablet, Rfl: 0 .  atorvastatin (LIPITOR) 10 MG tablet, Take 10 mg by mouth daily., Disp: , Rfl:  .  budesonide-formoterol (SYMBICORT) 160-4.5 MCG/ACT inhaler, Inhale 2 puffs into the lungs 2 (two) times daily as needed (for shortness of breath and wheezing). , Disp: , Rfl:  .  cholecalciferol (VITAMIN D) 1000 UNITS tablet, Take 1,000 Units by mouth daily., Disp: , Rfl:  .  cyclobenzaprine (FLEXERIL) 10 MG tablet, Take 10 mg by mouth 3 (three) times daily as needed for muscle spasms., Disp: , Rfl:  .  diazepam (VALIUM) 5 MG tablet, 2 bid (Patient taking differently: Take 10 mg by mouth 2 (two) times daily. ), Disp: 120 tablet,  Rfl: 4 .  docusate sodium (COLACE) 100 MG capsule, Take 100 mg by mouth daily as needed for mild constipation., Disp: , Rfl:  .  DULoxetine (CYMBALTA) 30 MG capsule, Take 30 mg by mouth daily., Disp: , Rfl:  .  fluticasone (FLONASE) 50 MCG/ACT nasal spray, Place 2 sprays into both nostrils daily as needed for allergies. , Disp: , Rfl: 5 .  folic acid (FOLVITE) 1 MG tablet, Take 1 mg by mouth daily., Disp: , Rfl:  .  Levomilnacipran HCl ER 40 MG CP24, Take 40 mg by mouth daily., Disp: 30 capsule, Rfl: 5 .  loratadine (CLARITIN) 10 MG tablet, Take 10 mg by mouth daily as needed for allergies. , Disp: , Rfl: 12 .  methocarbamol (ROBAXIN) 500 MG tablet, Take 1 tablet (500 mg total) by mouth 2 (two) times daily., Disp: 20 tablet, Rfl: 0 .  Multiple Vitamins-Minerals (MULTIVITAMIN WITH MINERALS) tablet, Take 1 tablet by mouth every morning. , Disp: , Rfl:  .  nebivolol (BYSTOLIC) 10 MG tablet, Take 1 tablet (10 mg total) by mouth daily., Disp: 30 tablet, Rfl: 1 .  nicotine (NICOTROL) 10 MG inhaler, Inhale 1 continuous puffing into the lungs as needed for smoking cessation., Disp: , Rfl:  .  nortriptyline (PAMELOR) 10 MG capsule, Take 1 capsule (10 mg total) by mouth at bedtime., Disp: 30 capsule, Rfl: 5 .  pantoprazole (PROTONIX) 40 MG tablet, Take 40 mg by mouth daily before breakfast. , Disp: , Rfl: 12 .  potassium chloride SA (K-DUR,KLOR-CON) 20 MEQ tablet, Take 20 mEq by mouth daily as needed. , Disp: , Rfl:  .  sucralfate (CARAFATE) 1 G tablet, Take 1 tablet (1 g total) by mouth 3 (three) times daily with meals., Disp: 90 tablet, Rfl: 0 .  sulindac (CLINORIL) 200 MG tablet, Take 200 mg by mouth 2 (two) times daily., Disp: , Rfl: 5 .  temazepam (RESTORIL) 15 MG capsule, Take 30 mg by mouth at bedtime. , Disp: , Rfl:  .  topiramate (TOPAMAX) 25 MG capsule, Take 25 mg by mouth 2 (two) times daily., Disp: , Rfl: 5 .  triamterene-hydrochlorothiazide (MAXZIDE-25) 37.5-25 MG per tablet, Take 1 tablet by  mouth daily., Disp: , Rfl:  .  valACYclovir (VALTREX) 1000 MG tablet, Take 1,000 mg by mouth daily as needed (for out breaks). , Disp: , Rfl:  .  vitamin B-12 (CYANOCOBALAMIN) 1000 MCG tablet, Take 1,000 mcg by mouth daily., Disp: , Rfl:  .  zolpidem (AMBIEN CR) 12.5 MG CR tablet, Take 1 tablet (12.5 mg total) by mouth at bedtime as needed for sleep., Disp: 30 tablet, Rfl: 5   > 45 minutes spend reviewig records, images, and discussing options with patient.

## 2014-10-25 NOTE — Patient Instructions (Signed)
We will refer you to Dr. Aundra Dubin for a right heart catheterization and call you with the results.

## 2014-10-25 NOTE — Assessment & Plan Note (Signed)
I have been asked to evaluate Gloria Lewis for perioperative pulmonary risk for pulmonary hypertension. She's had an extensive workup up until this point which I think was entirely appropriate. The question remains as to whether or not she had a pulmonary and was him back when she was admitted in March 2016, but all studies performed after hospital discharge have not shown conclusive evidence of this. Her VQ scan was highly suggestive which in my mind raises the possibility that she may have had a pulmonary embolism, but the fact that she has chronic scarring in her right lungs could've altered the results of this test.  Regardless, her echocardiogram on 2 separate occasions has suggested pulmonary hypertension. There she has minimal symptoms she does note lightheadedness from time to time which is a nonspecific finding but in the setting of her right heart dilatation should probably not be ignored.  I explained to her today that I think her overall risk for having a perioperative pulmonary or cardiac complication is low considering her lack of symptoms, but the question remains as to whether or not she has pulmonary hypertension. I think the best approach moving forward would be for her to have a right heart catheterization to try to sort this out.  Plan: We will arrange a right heart catheterization

## 2014-10-26 ENCOUNTER — Telehealth: Payer: Self-pay | Admitting: Cardiology

## 2014-10-26 NOTE — Telephone Encounter (Signed)
Dr. Aundra Dubin- this message was taken under your name, but I don't see where you have seen the patient.  Dr. Pennie Banter is requesting a right heart cath for pulmonary hypertension. Is this someone you would want to see on your schedule prior to scheduling a cath or would they be ok with any provider/ PA?

## 2014-10-26 NOTE — Telephone Encounter (Signed)
Dr. Simonne Maffucci requesting pt have a heart cath, wants to know if pt needs to be seen here first and if so when, or can she be set up for the cath-needs after 11-05-14 because she is moving

## 2014-10-28 NOTE — Telephone Encounter (Signed)
I sent a note to Kevan Rosebush to get her in to see me thanks.

## 2014-10-28 NOTE — Telephone Encounter (Signed)
If she's still having chest pain, may need right and also left heart cath.  Perhaps I could see her in the office this week? Work in for me one afternoon while I am in hospital.

## 2014-10-29 ENCOUNTER — Telehealth: Payer: Self-pay | Admitting: Pulmonary Disease

## 2014-10-29 NOTE — Telephone Encounter (Signed)
In Epic it states that [place] office will be calling pt to make the appoinment. Called patient to inform her of this. Phone was forwarded. Please have triage to call pt back later or have pt call dr Oleh Genin office.

## 2014-10-29 NOTE — Telephone Encounter (Signed)
appt sch for [facility] 7/28

## 2014-10-29 NOTE — Telephone Encounter (Signed)
Patient called back and I advised her of Krista's note.  She will wait for Dr. Claris Gladden office to call her.

## 2014-11-01 ENCOUNTER — Other Ambulatory Visit (HOSPITAL_COMMUNITY): Payer: Self-pay

## 2014-11-01 ENCOUNTER — Encounter (HOSPITAL_COMMUNITY): Payer: Self-pay

## 2014-11-01 ENCOUNTER — Ambulatory Visit (HOSPITAL_COMMUNITY)
Admission: RE | Admit: 2014-11-01 | Discharge: 2014-11-01 | Disposition: A | Discharge status: Home / Self Care | Payer: Medicaid Other | Source: Ambulatory Visit | Attending: Internal Medicine | Admitting: Internal Medicine

## 2014-11-01 VITALS — BP 110/64 | HR 77 | Wt 225.5 lb

## 2014-11-01 DIAGNOSIS — I5022 Chronic systolic (congestive) heart failure: Secondary | ICD-10-CM

## 2014-11-01 DIAGNOSIS — I270 Primary pulmonary hypertension: Secondary | ICD-10-CM

## 2014-11-01 DIAGNOSIS — I10 Essential (primary) hypertension: Secondary | ICD-10-CM | POA: Diagnosis not present

## 2014-11-01 DIAGNOSIS — Z720 Tobacco use: Secondary | ICD-10-CM | POA: Diagnosis not present

## 2014-11-01 DIAGNOSIS — I272 Pulmonary hypertension, unspecified: Secondary | ICD-10-CM

## 2014-11-01 DIAGNOSIS — R072 Precordial pain: Secondary | ICD-10-CM | POA: Diagnosis not present

## 2014-11-01 NOTE — Patient Instructions (Signed)
Right heart catheterization scheduled for Friday 11/09/2014.  Follow up 3 weeks.  Do the following things EVERYDAY: 1) Weigh yourself in the morning before breakfast. Write it down and keep it in a log. 2) Take your medicines as prescribed 3) Eat low salt foods-Limit salt (sodium) to 2000 mg per day.  4) Stay as active as you can everyday 5) Limit all fluids for the day to less than 2 liters

## 2014-11-02 ENCOUNTER — Telehealth (HOSPITAL_COMMUNITY): Payer: Self-pay | Admitting: Cardiology

## 2014-11-02 NOTE — Progress Notes (Signed)
Patient ID: Gloria Lewis, female   DOB: 03-14-1956, 59 y.o.   MRN: [id number] PCP: Dr. Harlan Stains  59 yo with history of HTN, smoking, and pulmonary hypertension by echo presents for cardiology evaluation and potential RHC. Patient is a long-time smoker; she is working on quitting.  She was admitted in 3/16 with chest pain that was somewhat positional.  Lexiscan Cardiolite showed no ischemia or infarction.  She had a V/Q scan that actually suggested high risk for PE, but subsequent CTA chest did not show a PE.  There was RML and lingular scarring that may have created the appearance of PE on the V/Q scan.  Echo done later in 5/16 showed preserved LV systolic function but mildly dilated RV with PA systolic pressure 42 mmHg.  She had minimal obstruction or restriction on her PFTs, but DLCO was moderately reduced. She was seen by Dr [name] and referred here for [place].  I wanted to see her in the office first as there was some question of ongoing chest pain and whether she would need LHC along with RHC.   Patient states that she has generally been active and tries to walk or swim at the [facility] on most days.  Currently, she is short of breath after walking 2 times around the track at the [facility].  This is worse recently.  She is short of breath walking up a flight of steps.  No orthopnea/PND.  She was on oxygen after her admission in 3/16 but is no longer using oxygen.  She does not have exertional chest pain.  She gets lower chest/epigastric pain when she lies down in bed at night.   ECG: NSR, low voltage  Labs (4/16): K 3.9, creatinine 0.9, TSH normal, BNP 18, HCT 42.3  PMH: 1. HTN 2. Bipolar disorder 3. OA 4. GERD 5. Schizoaffective disorder 6. Migraines 7. Prior cocaine abuse 8. Active smoker.  PFTs (5/16) with minimal obstruction, minimal restriction, moderately decreased DLCO.   9. Pulmonary hypertension: Echo (5/16) with EF 55-60%, mid-apical inferior hypokinesis, RV mildly dilated with normal  systolic function, PA systolic pressure 42 mmHg. PFTs as above.  V/Q scan (4/16) high probability for PE => but CTA chest (4/16) showed no PE, RML and lingular scarring that may have made the V/Q scan appear positive.  10. Lexiscan Cardiolite (3/16) with EF 57%, no ischemia or infarction.   SH: Lives alone.  Prior cocaine abuse, none now.  Active smoker.    FH: Father with probable sudden cardiac death at age 27  ROS: All systems reviewed and negative except as per HPI.   Current Outpatient Prescriptions  Medication Sig Dispense Refill  . amLODipine (NORVASC) 5 MG tablet Take 5 mg by mouth daily.    Marland Kitchen aspirin EC 81 MG EC tablet Take 1 tablet (81 mg total) by mouth daily. 30 tablet 0  . atorvastatin (LIPITOR) 10 MG tablet Take 10 mg by mouth daily.    . budesonide-formoterol (SYMBICORT) 160-4.5 MCG/ACT inhaler Inhale 2 puffs into the lungs 2 (two) times daily as needed (for shortness of breath and wheezing).     . cholecalciferol (VITAMIN D) 1000 UNITS tablet Take 1,000 Units by mouth daily.    . cyclobenzaprine (FLEXERIL) 10 MG tablet Take 10 mg by mouth 3 (three) times daily as needed for muscle spasms.    . diazepam (VALIUM) 5 MG tablet 2 bid (Patient taking differently: Take 10 mg by mouth 2 (two) times daily. ) 120 tablet 4  . docusate  sodium (COLACE) 100 MG capsule Take 100 mg by mouth daily as needed for mild constipation.    . DULoxetine (CYMBALTA) 30 MG capsule Take 30 mg by mouth daily.    . fluticasone (FLONASE) 50 MCG/ACT nasal spray Place 2 sprays into both nostrils daily as needed for allergies.   5  . folic acid (FOLVITE) 1 MG tablet Take 1 mg by mouth daily.    . Levomilnacipran HCl ER 40 MG CP24 Take 40 mg by mouth daily. 30 capsule 5  . loratadine (CLARITIN) 10 MG tablet Take 10 mg by mouth daily as needed for allergies.   12  . Multiple Vitamins-Minerals (MULTIVITAMIN WITH MINERALS) tablet Take 1 tablet by mouth every morning.     . nebivolol (BYSTOLIC) 10 MG tablet Take 1  tablet (10 mg total) by mouth daily. 30 tablet 1  . nicotine (NICOTROL) 10 MG inhaler Inhale 1 continuous puffing into the lungs as needed for smoking cessation.    . nortriptyline (PAMELOR) 10 MG capsule Take 1 capsule (10 mg total) by mouth at bedtime. 30 capsule 5  . pantoprazole (PROTONIX) 40 MG tablet Take 40 mg by mouth daily before breakfast.   12  . potassium chloride SA (K-DUR,KLOR-CON) 20 MEQ tablet Take 20 mEq by mouth daily as needed.     . sucralfate (CARAFATE) 1 G tablet Take 1 tablet (1 g total) by mouth 3 (three) times daily with meals. 90 tablet 0  . sulindac (CLINORIL) 200 MG tablet Take 200 mg by mouth 2 (two) times daily.  5  . temazepam (RESTORIL) 15 MG capsule Take 30 mg by mouth at bedtime.     . triamterene-hydrochlorothiazide (MAXZIDE-25) 37.5-25 MG per tablet Take 1 tablet by mouth daily.    . valACYclovir (VALTREX) 1000 MG tablet Take 1,000 mg by mouth daily as needed (for out breaks).     . vitamin B-12 (CYANOCOBALAMIN) 1000 MCG tablet Take 1,000 mcg by mouth daily.    Marland Kitchen zolpidem (AMBIEN CR) 12.5 MG CR tablet Take 1 tablet (12.5 mg total) by mouth at bedtime as needed for sleep. (Patient not taking: Reported on 11/01/2014) 30 tablet 5   No current facility-administered medications for this encounter.   BP 110/64 mmHg  Pulse 77  Wt 225 lb 8 oz (102.286 kg)  SpO2 98% General: NAD Neck: No JVD, no thyromegaly or thyroid nodule.  Lungs: Clear to auscultation bilaterally with normal respiratory effort. CV: Nondisplaced PMI.  Heart regular S1/S2, no S3/S4, no murmur.  No peripheral edema.  No carotid bruit.  Normal pedal pulses.  Abdomen: Soft, nontender, no hepatosplenomegaly, no distention.  Skin: Intact without lesions or rashes.  Neurologic: Alert and oriented x 3.  Psych: Normal affect. Extremities: No clubbing or cyanosis.  HEENT: Normal.   Assessment/Plan: 1. Chest pain: I do not think this is cardiac.  It tends to occur when she lies down at night and is  more epigastric.  I suspect that this is GERD.  She is already on Protonix, may help to increase to bid. Lexiscan Cardiolite in 3/16 showed no ischemia or infarction.  2. Pulmonary hypertension: Last two echoes have shown elevation of PA pressure and mild RV dilation.  She has significant exertional dyspnea and at one point required oxygen.  Despite smoking, PFTs show neither significant restriction nor obstruction, but DLCO is low.  This could be due to pulmonary vascular disease.  There was concern for PE given V/Q scan, but CTA chest showed no PE and did  show some scarring that could have caused a false positive V/Q.  BNP normal.  - I will arrange for RHC. I do not think that she will need LHC.  - Would be reasonable to get sleep study, will defer to pulmonary.  3. ?PE: Question of PE based on V/Q in 4/16 but CTA chest showed no PE and did show scarring that may have caused false positive.  Dr [name] following, no anticoagulation at this time.  4. Smoking: I strongly encouraged her to quit.   Loralie Champagne 11/02/2014

## 2014-11-02 NOTE — Telephone Encounter (Signed)
Pt scheduled for RHC on 11/09/2014 with [place] code [id number] With pts current insurance- Medicaid No pre cert required

## 2014-11-05 ENCOUNTER — Ambulatory Visit (HOSPITAL_COMMUNITY): Payer: No Typology Code available for payment source

## 2014-11-09 ENCOUNTER — Ambulatory Visit (HOSPITAL_COMMUNITY)
Admission: RE | Admit: 2014-11-09 | Discharge: 2014-11-09 | Disposition: A | Discharge status: Home / Self Care | Payer: Medicaid Other | Source: Ambulatory Visit | Attending: Cardiology | Admitting: Cardiology

## 2014-11-09 ENCOUNTER — Encounter (HOSPITAL_COMMUNITY)
Admission: RE | Disposition: A | Discharge status: Home / Self Care | Payer: Self-pay | Source: Ambulatory Visit | Attending: Cardiology

## 2014-11-09 DIAGNOSIS — I10 Essential (primary) hypertension: Secondary | ICD-10-CM | POA: Insufficient documentation

## 2014-11-09 DIAGNOSIS — Z79899 Other long term (current) drug therapy: Secondary | ICD-10-CM | POA: Insufficient documentation

## 2014-11-09 DIAGNOSIS — I270 Primary pulmonary hypertension: Secondary | ICD-10-CM | POA: Diagnosis not present

## 2014-11-09 DIAGNOSIS — F319 Bipolar disorder, unspecified: Secondary | ICD-10-CM | POA: Insufficient documentation

## 2014-11-09 DIAGNOSIS — Z7951 Long term (current) use of inhaled steroids: Secondary | ICD-10-CM | POA: Insufficient documentation

## 2014-11-09 DIAGNOSIS — K219 Gastro-esophageal reflux disease without esophagitis: Secondary | ICD-10-CM | POA: Insufficient documentation

## 2014-11-09 DIAGNOSIS — I5022 Chronic systolic (congestive) heart failure: Secondary | ICD-10-CM

## 2014-11-09 DIAGNOSIS — F259 Schizoaffective disorder, unspecified: Secondary | ICD-10-CM | POA: Insufficient documentation

## 2014-11-09 DIAGNOSIS — F17210 Nicotine dependence, cigarettes, uncomplicated: Secondary | ICD-10-CM | POA: Insufficient documentation

## 2014-11-09 DIAGNOSIS — I272 Other secondary pulmonary hypertension: Secondary | ICD-10-CM | POA: Insufficient documentation

## 2014-11-09 DIAGNOSIS — Z7982 Long term (current) use of aspirin: Secondary | ICD-10-CM | POA: Diagnosis not present

## 2014-11-09 DIAGNOSIS — F17200 Nicotine dependence, unspecified, uncomplicated: Secondary | ICD-10-CM | POA: Diagnosis not present

## 2014-11-09 HISTORY — PX: CARDIAC CATHETERIZATION: SHX172

## 2014-11-09 LAB — BASIC METABOLIC PANEL
Anion gap: 11 (ref 5–15)
BUN: 9 mg/dL (ref 6–20)
CHLORIDE: 102 mmol/L (ref 101–111)
CO2: 29 mmol/L (ref 22–32)
CREATININE: 0.98 mg/dL (ref 0.44–1.00)
Calcium: 9.3 mg/dL (ref 8.9–10.3)
GFR calc Af Amer: >60 mL/min (ref >60)
GFR calc non Af Amer: >60 mL/min (ref >60)
Glucose, Bld: 99 mg/dL (ref 65–99)
POTASSIUM: 2.9 mmol/L — AB (ref 3.5–5.1)
Sodium: 142 mmol/L (ref 135–145)

## 2014-11-09 LAB — POCT I-STAT 3, VENOUS BLOOD GAS (G3P V)
ACID-BASE EXCESS: 2 mmol/L (ref 0.0–2.0)
ACID-BASE EXCESS: 3 mmol/L — AB (ref 0.0–2.0)
Bicarbonate: 27.8 mEq/L — ABNORMAL HIGH (ref 20.0–24.0)
Bicarbonate: 28.8 mEq/L — ABNORMAL HIGH (ref 20.0–24.0)
O2 Saturation: 77 %
O2 Saturation: 78 %
PO2 VEN: 44 mmHg (ref 30.0–45.0)
TCO2: 29 mmol/L (ref 0–100)
TCO2: 30 mmol/L (ref 0–100)
pCO2, Ven: 47.3 mmHg (ref 45.0–50.0)
pCO2, Ven: 48.3 mmHg (ref 45.0–50.0)
pH, Ven: 7.377 — ABNORMAL HIGH (ref 7.250–7.300)
pH, Ven: 7.384 — ABNORMAL HIGH (ref 7.250–7.300)
pO2, Ven: 43 mmHg (ref 30.0–45.0)

## 2014-11-09 LAB — CBC
HEMATOCRIT: 39.3 % (ref 36.0–46.0)
Hemoglobin: 13.6 g/dL (ref 12.0–15.0)
MCH: 30.2 pg (ref 26.0–34.0)
MCHC: 34.6 g/dL (ref 30.0–36.0)
MCV: 87.3 fL (ref 78.0–100.0)
Platelets: 313 10*3/uL (ref 150–400)
RBC: 4.5 MIL/uL (ref 3.87–5.11)
RDW: 14.1 % (ref 11.5–15.5)
WBC: 6.2 10*3/uL (ref 4.0–10.5)

## 2014-11-09 LAB — PROTIME-INR
INR: 1.02 (ref 0.00–1.49)
Prothrombin Time: 13.6 seconds (ref 11.6–15.2)

## 2014-11-09 SURGERY — RIGHT HEART CATH
Anesthesia: LOCAL

## 2014-11-09 MED ORDER — LIDOCAINE HCL (PF) 1 % IJ SOLN
INTRAMUSCULAR | Status: DC | PRN
  Administered 2014-11-09: 12:00:00

## 2014-11-09 MED ORDER — SODIUM CHLORIDE 0.9 % IJ SOLN: 3.0000 mL | Freq: Two times a day (BID) | INTRAMUSCULAR | Status: DC

## 2014-11-09 MED ORDER — SODIUM CHLORIDE 0.9 % IJ SOLN: 3.0000 mL | INTRAMUSCULAR | Status: DC | PRN

## 2014-11-09 MED ORDER — POTASSIUM CHLORIDE CRYS ER 20 MEQ PO TBCR
40.0000 meq | EXTENDED_RELEASE_TABLET | Freq: Once | ORAL | Status: AC
  Administered 2014-11-09: 40 meq via ORAL

## 2014-11-09 MED ORDER — ACETAMINOPHEN 325 MG PO TABS: 650.0000 mg | ORAL_TABLET | ORAL | Status: DC | PRN

## 2014-11-09 MED ORDER — MIDAZOLAM HCL 2 MG/2ML IJ SOLN
INTRAMUSCULAR | Status: DC | PRN
  Administered 2014-11-09: 1 mg via INTRAVENOUS

## 2014-11-09 MED ORDER — DIPHENHYDRAMINE HCL 50 MG/ML IJ SOLN
25.0000 mg | INTRAMUSCULAR | Status: AC
  Administered 2014-11-09: 25 mg via INTRAVENOUS

## 2014-11-09 MED ORDER — SODIUM CHLORIDE 0.9 % IV SOLN
INTRAVENOUS | Status: DC
  Administered 2014-11-09: 11:00:00 via INTRAVENOUS

## 2014-11-09 MED ORDER — POTASSIUM CHLORIDE CRYS ER 20 MEQ PO TBCR
EXTENDED_RELEASE_TABLET | ORAL | Status: DC
Start: 2014-11-09 — End: 2014-11-09
  Filled 2014-11-09: qty 2

## 2014-11-09 MED ORDER — MIDAZOLAM HCL 2 MG/2ML IJ SOLN
INTRAMUSCULAR | Status: AC
  Filled 2014-11-09: qty 4

## 2014-11-09 MED ORDER — LIDOCAINE HCL (PF) 1 % IJ SOLN
INTRAMUSCULAR | Status: AC
  Filled 2014-11-09: qty 30

## 2014-11-09 MED ORDER — FENTANYL CITRATE (PF) 100 MCG/2ML IJ SOLN
INTRAMUSCULAR | Status: AC
  Filled 2014-11-09: qty 4

## 2014-11-09 MED ORDER — ONDANSETRON HCL 4 MG/2ML IJ SOLN: 4.0000 mg | Freq: Four times a day (QID) | INTRAMUSCULAR | Status: DC | PRN

## 2014-11-09 MED ORDER — SODIUM CHLORIDE 0.9 % IV SOLN: 250.0000 mL | INTRAVENOUS | Status: DC | PRN

## 2014-11-09 MED ORDER — FENTANYL CITRATE (PF) 100 MCG/2ML IJ SOLN
INTRAMUSCULAR | Status: DC | PRN
  Administered 2014-11-09: 25 ug via INTRAVENOUS

## 2014-11-09 MED ORDER — SODIUM CHLORIDE 0.9 % IV SOLN: INTRAVENOUS | Status: DC | PRN

## 2014-11-09 MED ORDER — DIPHENHYDRAMINE HCL 50 MG/ML IJ SOLN
INTRAMUSCULAR | Status: AC
  Administered 2014-11-09: 25 mg via INTRAVENOUS
  Filled 2014-11-09: qty 1

## 2014-11-09 SURGICAL SUPPLY — 20 items
CATH BALLN WEDGE 5F 110CM (CATHETERS) ×2 IMPLANT
CATH INFINITI 5 FR JL3.5 (CATHETERS) IMPLANT
CATH INFINITI 5 FR MPA2 (CATHETERS) IMPLANT
CATH INFINITI 5FR ANG PIGTAIL (CATHETERS) ×1 IMPLANT
CATH INFINITI JR4 5F (CATHETERS) ×1 IMPLANT
CATH SWAN GANZ 7F STRAIGHT (CATHETERS) IMPLANT
DEVICE RAD COMP TR BAND LRG (VASCULAR PRODUCTS) ×1 IMPLANT
GLIDESHEATH SLEND SS 6F .021 (SHEATH) ×1 IMPLANT
KIT HEART LEFT (KITS) ×1 IMPLANT
KIT HEART RIGHT NAMIC (KITS) ×1 IMPLANT
PACK CARDIAC CATHETERIZATION (CUSTOM PROCEDURE TRAY) ×2 IMPLANT
SHEATH FAST CATH BRACH 5F 5CM (SHEATH) ×2 IMPLANT
SHEATH PINNACLE 5F 10CM (SHEATH) IMPLANT
SHEATH PINNACLE 7F 10CM (SHEATH) IMPLANT
SYR MEDRAD MARK V 150ML (SYRINGE) ×1 IMPLANT
TRANSDUCER W/STOPCOCK (MISCELLANEOUS) ×3 IMPLANT
TUBING CIL FLEX 10 FLL-RA (TUBING) ×1 IMPLANT
WIRE EMERALD 3MM-J .035X150CM (WIRE) IMPLANT
WIRE HI TORQ VERSACORE-J 145CM (WIRE) ×1 IMPLANT
WIRE SAFE-T 1.5MM-J .035X260CM (WIRE) ×1 IMPLANT

## 2014-11-09 NOTE — Interval H&P Note (Signed)
History and Physical Interval Note:  11/09/2014 12:10 PM  Gloria Lewis  has presented today for surgery, with the diagnosis of chf  The various methods of treatment have been discussed with the patient and family. After consideration of risks, benefits and other options for treatment, the patient has consented to  Procedure(s): Right Heart Cath (N/A) as a surgical intervention .  The patient's history has been reviewed, patient examined, no change in status, stable for surgery.  I have reviewed the patient's chart and labs.  Questions were answered to the patient's satisfaction.     [name] [employer]

## 2014-11-09 NOTE — Discharge Instructions (Signed)

## 2014-11-09 NOTE — H&P (View-Only) (Signed)
Patient ID: Gloria Lewis, female   DOB: 1955/08/06, 59 y.o.   MRN: [id number] PCP: Dr. Harlan Stains  59 yo with history of HTN, smoking, and pulmonary hypertension by echo presents for cardiology evaluation and potential RHC. Patient is a long-time smoker; she is working on quitting.  She was admitted in 3/16 with chest pain that was somewhat positional.  Lexiscan Cardiolite showed no ischemia or infarction.  She had a V/Q scan that actually suggested high risk for PE, but subsequent CTA chest did not show a PE.  There was RML and lingular scarring that may have created the appearance of PE on the V/Q scan.  Echo done later in 5/16 showed preserved LV systolic function but mildly dilated RV with PA systolic pressure 42 mmHg.  She had minimal obstruction or restriction on her PFTs, but DLCO was moderately reduced. She was seen by Dr [name] and referred here for [place].  I wanted to see her in the office first as there was some question of ongoing chest pain and whether she would need LHC along with RHC.   Patient states that she has generally been active and tries to walk or swim at the [facility] on most days.  Currently, she is short of breath after walking 2 times around the track at the [facility].  This is worse recently.  She is short of breath walking up a flight of steps.  No orthopnea/PND.  She was on oxygen after her admission in 3/16 but is no longer using oxygen.  She does not have exertional chest pain.  She gets lower chest/epigastric pain when she lies down in bed at night.   ECG: NSR, low voltage  Labs (4/16): K 3.9, creatinine 0.9, TSH normal, BNP 18, HCT 42.3  PMH: 1. HTN 2. Bipolar disorder 3. OA 4. GERD 5. Schizoaffective disorder 6. Migraines 7. Prior cocaine abuse 8. Active smoker.  PFTs (5/16) with minimal obstruction, minimal restriction, moderately decreased DLCO.   9. Pulmonary hypertension: Echo (5/16) with EF 55-60%, mid-apical inferior hypokinesis, RV mildly dilated with normal  systolic function, PA systolic pressure 42 mmHg. PFTs as above.  V/Q scan (4/16) high probability for PE => but CTA chest (4/16) showed no PE, RML and lingular scarring that may have made the V/Q scan appear positive.  10. Lexiscan Cardiolite (3/16) with EF 57%, no ischemia or infarction.   SH: Lives alone.  Prior cocaine abuse, none now.  Active smoker.    FH: Father with probable sudden cardiac death at age 37  ROS: All systems reviewed and negative except as per HPI.   Current Outpatient Prescriptions  Medication Sig Dispense Refill  . amLODipine (NORVASC) 5 MG tablet Take 5 mg by mouth daily.    Marland Kitchen aspirin EC 81 MG EC tablet Take 1 tablet (81 mg total) by mouth daily. 30 tablet 0  . atorvastatin (LIPITOR) 10 MG tablet Take 10 mg by mouth daily.    . budesonide-formoterol (SYMBICORT) 160-4.5 MCG/ACT inhaler Inhale 2 puffs into the lungs 2 (two) times daily as needed (for shortness of breath and wheezing).     . cholecalciferol (VITAMIN D) 1000 UNITS tablet Take 1,000 Units by mouth daily.    . cyclobenzaprine (FLEXERIL) 10 MG tablet Take 10 mg by mouth 3 (three) times daily as needed for muscle spasms.    . diazepam (VALIUM) 5 MG tablet 2 bid (Patient taking differently: Take 10 mg by mouth 2 (two) times daily. ) 120 tablet 4  . docusate  sodium (COLACE) 100 MG capsule Take 100 mg by mouth daily as needed for mild constipation.    . DULoxetine (CYMBALTA) 30 MG capsule Take 30 mg by mouth daily.    . fluticasone (FLONASE) 50 MCG/ACT nasal spray Place 2 sprays into both nostrils daily as needed for allergies.   5  . folic acid (FOLVITE) 1 MG tablet Take 1 mg by mouth daily.    . Levomilnacipran HCl ER 40 MG CP24 Take 40 mg by mouth daily. 30 capsule 5  . loratadine (CLARITIN) 10 MG tablet Take 10 mg by mouth daily as needed for allergies.   12  . Multiple Vitamins-Minerals (MULTIVITAMIN WITH MINERALS) tablet Take 1 tablet by mouth every morning.     . nebivolol (BYSTOLIC) 10 MG tablet Take 1  tablet (10 mg total) by mouth daily. 30 tablet 1  . nicotine (NICOTROL) 10 MG inhaler Inhale 1 continuous puffing into the lungs as needed for smoking cessation.    . nortriptyline (PAMELOR) 10 MG capsule Take 1 capsule (10 mg total) by mouth at bedtime. 30 capsule 5  . pantoprazole (PROTONIX) 40 MG tablet Take 40 mg by mouth daily before breakfast.   12  . potassium chloride SA (K-DUR,KLOR-CON) 20 MEQ tablet Take 20 mEq by mouth daily as needed.     . sucralfate (CARAFATE) 1 G tablet Take 1 tablet (1 g total) by mouth 3 (three) times daily with meals. 90 tablet 0  . sulindac (CLINORIL) 200 MG tablet Take 200 mg by mouth 2 (two) times daily.  5  . temazepam (RESTORIL) 15 MG capsule Take 30 mg by mouth at bedtime.     . triamterene-hydrochlorothiazide (MAXZIDE-25) 37.5-25 MG per tablet Take 1 tablet by mouth daily.    . valACYclovir (VALTREX) 1000 MG tablet Take 1,000 mg by mouth daily as needed (for out breaks).     . vitamin B-12 (CYANOCOBALAMIN) 1000 MCG tablet Take 1,000 mcg by mouth daily.    Marland Kitchen zolpidem (AMBIEN CR) 12.5 MG CR tablet Take 1 tablet (12.5 mg total) by mouth at bedtime as needed for sleep. (Patient not taking: Reported on 11/01/2014) 30 tablet 5   No current facility-administered medications for this encounter.   BP 110/64 mmHg  Pulse 77  Wt 225 lb 8 oz (102.286 kg)  SpO2 98% General: NAD Neck: No JVD, no thyromegaly or thyroid nodule.  Lungs: Clear to auscultation bilaterally with normal respiratory effort. CV: Nondisplaced PMI.  Heart regular S1/S2, no S3/S4, no murmur.  No peripheral edema.  No carotid bruit.  Normal pedal pulses.  Abdomen: Soft, nontender, no hepatosplenomegaly, no distention.  Skin: Intact without lesions or rashes.  Neurologic: Alert and oriented x 3.  Psych: Normal affect. Extremities: No clubbing or cyanosis.  HEENT: Normal.   Assessment/Plan: 1. Chest pain: I do not think this is cardiac.  It tends to occur when she lies down at night and is  more epigastric.  I suspect that this is GERD.  She is already on Protonix, may help to increase to bid. Lexiscan Cardiolite in 3/16 showed no ischemia or infarction.  2. Pulmonary hypertension: Last two echoes have shown elevation of PA pressure and mild RV dilation.  She has significant exertional dyspnea and at one point required oxygen.  Despite smoking, PFTs show neither significant restriction nor obstruction, but DLCO is low.  This could be due to pulmonary vascular disease.  There was concern for PE given V/Q scan, but CTA chest showed no PE and did  show some scarring that could have caused a false positive V/Q.  BNP normal.  - I will arrange for RHC. I do not think that she will need LHC.  - Would be reasonable to get sleep study, will defer to pulmonary.  3. ?PE: Question of PE based on V/Q in 4/16 but CTA chest showed no PE and did show scarring that may have caused false positive.  Dr [name] following, no anticoagulation at this time.  4. Smoking: I strongly encouraged her to quit.   Loralie Champagne 11/02/2014

## 2014-11-12 ENCOUNTER — Encounter (HOSPITAL_COMMUNITY): Payer: Self-pay | Admitting: Cardiology

## 2014-11-12 ENCOUNTER — Telehealth: Payer: Self-pay | Admitting: Pulmonary Disease

## 2014-11-12 NOTE — Telephone Encounter (Signed)
I called her to let her know that her right heart catheterization did not show pulmonary hypertension. Given this, I explained to her that she could proceed with orthopedic surgery.

## 2014-11-16 ENCOUNTER — Ambulatory Visit (HOSPITAL_COMMUNITY)
Admission: RE | Admit: 2014-11-16 | Discharge: 2014-11-16 | Disposition: A | Discharge status: Home / Self Care | Payer: Medicaid Other | Source: Ambulatory Visit | Attending: Family Medicine | Admitting: Family Medicine

## 2014-11-16 DIAGNOSIS — Z1231 Encounter for screening mammogram for malignant neoplasm of breast: Secondary | ICD-10-CM | POA: Insufficient documentation

## 2014-11-26 ENCOUNTER — Inpatient Hospital Stay (HOSPITAL_COMMUNITY): Admission: RE | Admit: 2014-11-26 | Payer: Self-pay | Source: Ambulatory Visit

## 2014-12-24 ENCOUNTER — Other Ambulatory Visit: Payer: Self-pay

## 2014-12-24 MED ORDER — DULOXETINE HCL 30 MG PO CPEP
30.0000 mg | ORAL_CAPSULE | Freq: Every day | ORAL | Status: DC
Start: 2014-12-24 — End: 2015-01-09

## 2015-01-04 ENCOUNTER — Telehealth: Payer: Self-pay | Admitting: Pulmonary Disease

## 2015-01-04 NOTE — Telephone Encounter (Signed)
Called and spoke with pt Pt states that she received telephone call from Dr [name] in August stating she was clear for surgery Pt states that forms were not submitted to [place] and pt can not have surgery until paperwork is completed Called [facility] and requested that paperwork be refaxed to office for completion.  Will forward to  [place] to follow up on

## 2015-01-08 NOTE — Telephone Encounter (Signed)
Patient called back and said phone number to call is [phone] speak with Abigail Butts at ext. [phone].

## 2015-01-08 NOTE — Telephone Encounter (Signed)
Have these been received? Thanks.

## 2015-01-08 NOTE — Telephone Encounter (Signed)
Called spoke with pt. Made her aware of below. She is going to see what number she has to give [place] to call for the paperwork. Will await call back

## 2015-01-08 NOTE — Telephone Encounter (Signed)
Patient returned call and states [facility] is doing her surgery. Patient can be reached at [phone].

## 2015-01-08 NOTE — Telephone Encounter (Signed)
Called # provided below and LMTCB x1 for Abigail Butts to have forms faxed over that is needed

## 2015-01-08 NOTE — Telephone Encounter (Signed)
I have not received any paperwork. Called [facility] to re-request paperwork, states the pt has not been seen since 05/2013 and they have no surgery scheduled for the pt.  lmtcb X1 for pt to verify that this is the office performing the sx.

## 2015-01-09 ENCOUNTER — Ambulatory Visit (INDEPENDENT_AMBULATORY_CARE_PROVIDER_SITE_OTHER): Payer: Self-pay | Admitting: Psychiatry

## 2015-01-09 VITALS — BP 115/89 | HR 69 | Ht 67.0 in | Wt 220.6 lb

## 2015-01-09 DIAGNOSIS — F331 Major depressive disorder, recurrent, moderate: Secondary | ICD-10-CM

## 2015-01-09 MED ORDER — NORTRIPTYLINE HCL 25 MG PO CAPS: 25.0000 mg | ORAL_CAPSULE | Freq: Every day | ORAL | Status: DC

## 2015-01-09 MED ORDER — DIAZEPAM 5 MG PO TABS: 5.0000 mg | ORAL_TABLET | Freq: Four times a day (QID) | ORAL | Status: DC

## 2015-01-09 MED ORDER — ZOLPIDEM TARTRATE ER 12.5 MG PO TBCR: 12.5000 mg | EXTENDED_RELEASE_TABLET | Freq: Every evening | ORAL | Status: DC | PRN

## 2015-01-09 MED ORDER — LEVOMILNACIPRAN HCL ER 40 MG PO CP24: 40.0000 mg | ORAL_CAPSULE | Freq: Every day | ORAL | Status: DC

## 2015-01-09 NOTE — Telephone Encounter (Signed)
Called wendy again and LMTCB x2
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
## 2015-01-10 NOTE — Telephone Encounter (Signed)
Spoke with pt, advised by pt that she needs 2 pulm provider's clearance.  I advised that 1 provider should suffice, and that she only saw BQ for sx clearance, not MW.   Stated that I would call [facility] to clarify.  Pt came in on 12/27/14 to do R shoulder arthroscopy.  Pt needed clearance from Dr. Dema Severin, Dr. Aundra Dubin, and Pulmonary.  Abigail Butts [employer] school teacher) states that only 1 provider's clearance is necessary.   Paperwork will be faxed early next week after BQ returns to the office.

## 2015-01-10 NOTE — Telephone Encounter (Signed)
Form on patient has been received from [place], not [facility]. Will hold onto form for BQ to fill out when he returns to clinic on Monday.

## 2015-01-10 NOTE — Telephone Encounter (Signed)
I advised patient of notes below.  She is wanting to know now if Dr. Melvyn Novas will be signing the clearance form as well.  It is her understanding she will need clearance from Dr. [name] and from Dr. Melvyn Novas.

## 2015-01-14 ENCOUNTER — Telehealth (HOSPITAL_COMMUNITY): Payer: Self-pay | Admitting: *Deleted

## 2015-01-14 NOTE — Telephone Encounter (Signed)
Pt calling stating that she is needing clearence from [facility] has it form everyone else she can be reach [phone].Gloria Lewis

## 2015-01-14 NOTE — Telephone Encounter (Signed)
lmtcb X1 for pt. BQ was out of the office last week and unable to sign off on sx clearance.  Pt's ortho office knows this.  This will be sent after BQ is in clinic.

## 2015-01-14 NOTE — Telephone Encounter (Signed)
[phone], pt cb

## 2015-01-14 NOTE — Telephone Encounter (Signed)
Called and spoke with pt Pt was calling and wanted to know the status of paperwork for surgical clearance Informed pt that Dr [name] has been on vacation but he is back now and will be addressed Pt voiced understanding of this   Will forward to [place] to follow up on.

## 2015-01-14 NOTE — Telephone Encounter (Signed)
Received request from [facility] for clearance for right shoulder surgery, per Dr Aundra Dubin pt is cleared for surgery, form faxed back to Pollyann Savoy at [phone]

## 2015-01-15 NOTE — Telephone Encounter (Signed)
Per Caryl Pina, she gave to Dr. [name] to sign, she called Ortho office and advised them that she is awaiting signature.   Caryl Pina will fax surgical clearance once it has been completed.

## 2015-01-15 NOTE — Telephone Encounter (Signed)
Form received from BQ, faxed with confirmation received to [facility].  Nothing further needed.

## 2015-01-21 NOTE — Pre-Procedure Instructions (Addendum)
KATENA PETITJEAN  01/21/2015      [place], [address] [place] [address] Phone: [phone] Fax: [phone]  [id number] [place], [address] [place] [address] Phone: [phone] Fax: [phone]    Your procedure is scheduled on Thurs, Oct 20 @ 7:30 AM  Report to [facility] at 5:30 AM  Call this number if you have problems the morning of surgery:  [phone]   Remember:  Do not eat food or drink liquids after midnight.  Take these medicines the morning of surgery with A SIP OF WATER Amlodipine(Norvasc),Symbicort<Bring Your Inhaler With You>,Valium(Diazepam),Flonase(Fluticasone),Claritin(Loratadine-if needed),Bystolic(Nebivolol),Pantoprazole(Protonix),and Valtrex(Valacyclovir)               Stop taking your Aspirin.No Goody's,BC's,Aleve,Ibuprofen,FIsh Oil,or any Herbal Medications.    Do not wear jewelry, make-up or nail polish.  Do not wear lotions, powders, or perfumes.  You may wear deodorant.  Do not shave 48 hours prior to surgery.    Do not bring valuables to the hospital.  [facility] is not responsible for any belongings or valuables.  Contacts, dentures or bridgework may not be worn into surgery.  Leave your suitcase in the car.  After surgery it may be brought to your room.  For patients admitted to the hospital, discharge time will be determined by your treatment team.  Patients discharged the day of surgery will not be allowed to drive home.    Special instructions:  [facility] - Preparing for Surgery  Before surgery, you can play an important role.  Because skin is not sterile, your skin needs to be as free of germs as possible.  You can reduce the number of germs on you skin by washing with CHG (chlorahexidine gluconate) soap before surgery.  CHG is an antiseptic cleaner which kills germs and bonds with the skin to  continue killing germs even after washing.  Please DO NOT use if you have an allergy to CHG or antibacterial soaps.  If your skin becomes reddened/irritated stop using the CHG and inform your nurse when you arrive at Short Stay.  Do not shave (including legs and underarms) for at least 48 hours prior to the first CHG shower.  You may shave your face.  Please follow these instructions carefully:   1.  Shower with CHG Soap the night before surgery and the                                morning of Surgery.  2.  If you choose to wash your hair, wash your hair first as usual with your       normal shampoo.  3.  After you shampoo, rinse your hair and body thoroughly to remove the                      Shampoo.  4.  Use CHG as you would any other liquid soap.  You can apply chg directly       to the skin and wash gently with scrungie or a clean washcloth.  5.  Apply the CHG Soap to your body ONLY FROM THE NECK DOWN.        Do not use on open wounds or open sores.  Avoid contact with your eyes,       ears, mouth  and genitals (private parts).  Wash genitals (private parts)       with your normal soap.  6.  Wash thoroughly, paying special attention to the area where your surgery        will be performed.  7.  Thoroughly rinse your body with warm water from the neck down.  8.  DO NOT shower/wash with your normal soap after using and rinsing off       the CHG Soap.  9.  Pat yourself dry with a clean towel.            10.  Wear clean pajamas.            11.  Place clean sheets on your bed the night of your first shower and do not        sleep with pets.  Day of Surgery  Do not apply any lotions/deoderants the morning of surgery.  Please wear clean clothes to the hospital/surgery center.    Please read over the following fact sheets that you were given. Pain Booklet, Coughing and Deep Breathing and Surgical Site Infection Prevention

## 2015-01-22 ENCOUNTER — Encounter (HOSPITAL_COMMUNITY): Payer: Self-pay

## 2015-01-22 ENCOUNTER — Encounter (HOSPITAL_COMMUNITY)
Admission: RE | Admit: 2015-01-22 | Discharge: 2015-01-22 | Disposition: A | Discharge status: Home / Self Care | Payer: Medicaid Other | Source: Ambulatory Visit | Attending: Orthopedic Surgery | Admitting: Orthopedic Surgery

## 2015-01-22 DIAGNOSIS — M7541 Impingement syndrome of right shoulder: Secondary | ICD-10-CM | POA: Insufficient documentation

## 2015-01-22 DIAGNOSIS — Z01812 Encounter for preprocedural laboratory examination: Secondary | ICD-10-CM | POA: Insufficient documentation

## 2015-01-22 LAB — CBC WITH DIFFERENTIAL/PLATELET
Basophils Absolute: 0 10*3/uL (ref 0.0–0.1)
Basophils Relative: 1 %
Eosinophils Absolute: 0.5 10*3/uL (ref 0.0–0.7)
Eosinophils Relative: 7 %
HEMATOCRIT: 41.2 % (ref 36.0–46.0)
Hemoglobin: 13.7 g/dL (ref 12.0–15.0)
LYMPHS PCT: 43 %
Lymphs Abs: 2.9 10*3/uL (ref 0.7–4.0)
MCH: 29.7 pg (ref 26.0–34.0)
MCHC: 33.3 g/dL (ref 30.0–36.0)
MCV: 89.2 fL (ref 78.0–100.0)
MONO ABS: 0.5 10*3/uL (ref 0.1–1.0)
MONOS PCT: 7 %
NEUTROS ABS: 2.8 10*3/uL (ref 1.7–7.7)
Neutrophils Relative %: 42 %
Platelets: 297 10*3/uL (ref 150–400)
RBC: 4.62 MIL/uL (ref 3.87–5.11)
RDW: 14.5 % (ref 11.5–15.5)
WBC: 6.6 10*3/uL (ref 4.0–10.5)

## 2015-01-22 LAB — COMPREHENSIVE METABOLIC PANEL
ALT: 13 U/L — ABNORMAL LOW (ref 14–54)
ANION GAP: 11 (ref 5–15)
AST: 20 U/L (ref 15–41)
Albumin: 4 g/dL (ref 3.5–5.0)
Alkaline Phosphatase: 80 U/L (ref 38–126)
BILIRUBIN TOTAL: 0.6 mg/dL (ref 0.3–1.2)
BUN: 7 mg/dL (ref 6–20)
CO2: 26 mmol/L (ref 22–32)
Calcium: 9.7 mg/dL (ref 8.9–10.3)
Chloride: 103 mmol/L (ref 101–111)
Creatinine, Ser: 0.92 mg/dL (ref 0.44–1.00)
GFR calc Af Amer: >60 mL/min (ref >60)
Glucose, Bld: 105 mg/dL — ABNORMAL HIGH (ref 65–99)
Potassium: 3.8 mmol/L (ref 3.5–5.1)
Sodium: 140 mmol/L (ref 135–145)
TOTAL PROTEIN: 6.8 g/dL (ref 6.5–8.1)

## 2015-01-22 LAB — PROTIME-INR
INR: 1.06 (ref 0.00–1.49)
Prothrombin Time: 14 seconds (ref 11.6–15.2)

## 2015-01-22 LAB — APTT: aPTT: 28 seconds (ref 24–37)

## 2015-01-24 ENCOUNTER — Ambulatory Visit (HOSPITAL_COMMUNITY): Payer: Medicaid Other | Admitting: Anesthesiology

## 2015-01-24 ENCOUNTER — Encounter (HOSPITAL_COMMUNITY)
Admission: RE | Disposition: A | Discharge status: Home / Self Care | Payer: Self-pay | Source: Ambulatory Visit | Attending: Orthopedic Surgery

## 2015-01-24 ENCOUNTER — Encounter (HOSPITAL_COMMUNITY): Payer: Self-pay | Admitting: General Practice

## 2015-01-24 ENCOUNTER — Ambulatory Visit (HOSPITAL_COMMUNITY)
Admission: RE | Admit: 2015-01-24 | Discharge: 2015-01-24 | Disposition: A | Discharge status: Home / Self Care | Payer: Medicaid Other | Source: Ambulatory Visit | Attending: Orthopedic Surgery | Admitting: Orthopedic Surgery

## 2015-01-24 DIAGNOSIS — M19011 Primary osteoarthritis, right shoulder: Secondary | ICD-10-CM | POA: Insufficient documentation

## 2015-01-24 DIAGNOSIS — M75101 Unspecified rotator cuff tear or rupture of right shoulder, not specified as traumatic: Secondary | ICD-10-CM | POA: Diagnosis not present

## 2015-01-24 DIAGNOSIS — Z7982 Long term (current) use of aspirin: Secondary | ICD-10-CM | POA: Insufficient documentation

## 2015-01-24 DIAGNOSIS — Z880 Allergy status to penicillin: Secondary | ICD-10-CM | POA: Insufficient documentation

## 2015-01-24 DIAGNOSIS — M7541 Impingement syndrome of right shoulder: Secondary | ICD-10-CM | POA: Diagnosis present

## 2015-01-24 DIAGNOSIS — G8929 Other chronic pain: Secondary | ICD-10-CM | POA: Insufficient documentation

## 2015-01-24 DIAGNOSIS — Z9109 Other allergy status, other than to drugs and biological substances: Secondary | ICD-10-CM | POA: Diagnosis not present

## 2015-01-24 DIAGNOSIS — S46211A Strain of muscle, fascia and tendon of other parts of biceps, right arm, initial encounter: Secondary | ICD-10-CM | POA: Insufficient documentation

## 2015-01-24 DIAGNOSIS — I10 Essential (primary) hypertension: Secondary | ICD-10-CM | POA: Insufficient documentation

## 2015-01-24 DIAGNOSIS — Z87891 Personal history of nicotine dependence: Secondary | ICD-10-CM | POA: Insufficient documentation

## 2015-01-24 DIAGNOSIS — Z79899 Other long term (current) drug therapy: Secondary | ICD-10-CM | POA: Diagnosis not present

## 2015-01-24 DIAGNOSIS — M94211 Chondromalacia, right shoulder: Secondary | ICD-10-CM | POA: Insufficient documentation

## 2015-01-24 DIAGNOSIS — X58XXXA Exposure to other specified factors, initial encounter: Secondary | ICD-10-CM | POA: Insufficient documentation

## 2015-01-24 HISTORY — PX: SHOULDER ARTHROSCOPY WITH SUBACROMIAL DECOMPRESSION: SHX5684

## 2015-01-24 SURGERY — SHOULDER ARTHROSCOPY WITH SUBACROMIAL DECOMPRESSION
Anesthesia: General | Site: Breast | Laterality: Right

## 2015-01-24 MED ORDER — ROCURONIUM BROMIDE 50 MG/5ML IV SOLN
INTRAVENOUS | Status: AC
  Filled 2015-01-24: qty 1

## 2015-01-24 MED ORDER — MEPERIDINE HCL 25 MG/ML IJ SOLN: 6.2500 mg | INTRAMUSCULAR | Status: DC | PRN

## 2015-01-24 MED ORDER — LIDOCAINE HCL (CARDIAC) 20 MG/ML IV SOLN
INTRAVENOUS | Status: AC
  Filled 2015-01-24: qty 5

## 2015-01-24 MED ORDER — VANCOMYCIN HCL IN DEXTROSE 1-5 GM/200ML-% IV SOLN
1000.0000 mg | INTRAVENOUS | Status: AC
  Administered 2015-01-24: 1000 mg via INTRAVENOUS
  Filled 2015-01-24: qty 200

## 2015-01-24 MED ORDER — ARTIFICIAL TEARS OP OINT
TOPICAL_OINTMENT | OPHTHALMIC | Status: DC | PRN
  Administered 2015-01-24: 1 via OPHTHALMIC

## 2015-01-24 MED ORDER — ONDANSETRON HCL 4 MG/2ML IJ SOLN: 4.0000 mg | Freq: Once | INTRAMUSCULAR | Status: DC | PRN

## 2015-01-24 MED ORDER — SODIUM CHLORIDE 0.9 % IJ SOLN
INTRAMUSCULAR | Status: AC
  Filled 2015-01-24: qty 10

## 2015-01-24 MED ORDER — VANCOMYCIN HCL IN DEXTROSE 1-5 GM/200ML-% IV SOLN
INTRAVENOUS | Status: AC
  Filled 2015-01-24: qty 200

## 2015-01-24 MED ORDER — MIDAZOLAM HCL 5 MG/5ML IJ SOLN
INTRAMUSCULAR | Status: DC | PRN
  Administered 2015-01-24: 2 mg via INTRAVENOUS

## 2015-01-24 MED ORDER — SUGAMMADEX SODIUM 200 MG/2ML IV SOLN
INTRAVENOUS | Status: AC
  Filled 2015-01-24: qty 2

## 2015-01-24 MED ORDER — PROPOFOL 10 MG/ML IV BOLUS
INTRAVENOUS | Status: AC
  Filled 2015-01-24: qty 20

## 2015-01-24 MED ORDER — ROCURONIUM BROMIDE 100 MG/10ML IV SOLN
INTRAVENOUS | Status: DC | PRN
  Administered 2015-01-24: 50 mg via INTRAVENOUS

## 2015-01-24 MED ORDER — PROPOFOL 10 MG/ML IV BOLUS
INTRAVENOUS | Status: DC | PRN
  Administered 2015-01-24: 150 mg via INTRAVENOUS

## 2015-01-24 MED ORDER — HYDROMORPHONE HCL 1 MG/ML IJ SOLN
INTRAMUSCULAR | Status: AC
  Filled 2015-01-24: qty 1

## 2015-01-24 MED ORDER — SUCCINYLCHOLINE CHLORIDE 20 MG/ML IJ SOLN
INTRAMUSCULAR | Status: AC
  Filled 2015-01-24: qty 1

## 2015-01-24 MED ORDER — MIDAZOLAM HCL 2 MG/2ML IJ SOLN
INTRAMUSCULAR | Status: AC
  Filled 2015-01-24: qty 4

## 2015-01-24 MED ORDER — 0.9 % SODIUM CHLORIDE (POUR BTL) OPTIME
TOPICAL | Status: DC | PRN
  Administered 2015-01-24: 1000 mL

## 2015-01-24 MED ORDER — CHLORHEXIDINE GLUCONATE 4 % EX LIQD: 60.0000 mL | Freq: Once | CUTANEOUS | Status: DC

## 2015-01-24 MED ORDER — ARTIFICIAL TEARS OP OINT
TOPICAL_OINTMENT | OPHTHALMIC | Status: AC
  Filled 2015-01-24: qty 3.5

## 2015-01-24 MED ORDER — SUGAMMADEX SODIUM 200 MG/2ML IV SOLN
INTRAVENOUS | Status: DC | PRN
  Administered 2015-01-24: 200 mg via INTRAVENOUS

## 2015-01-24 MED ORDER — LIDOCAINE HCL (CARDIAC) 20 MG/ML IV SOLN
INTRAVENOUS | Status: DC | PRN
  Administered 2015-01-24: 100 mg via INTRAVENOUS

## 2015-01-24 MED ORDER — HYDROMORPHONE HCL 1 MG/ML IJ SOLN
0.2500 mg | INTRAMUSCULAR | Status: DC | PRN
  Administered 2015-01-24 (×2): 0.5 mg via INTRAVENOUS

## 2015-01-24 MED ORDER — EPHEDRINE SULFATE 50 MG/ML IJ SOLN
INTRAMUSCULAR | Status: AC
  Filled 2015-01-24: qty 1

## 2015-01-24 MED ORDER — LACTATED RINGERS IV SOLN
INTRAVENOUS | Status: DC
  Administered 2015-01-24: 07:00:00 via INTRAVENOUS

## 2015-01-24 MED ORDER — FENTANYL CITRATE (PF) 250 MCG/5ML IJ SOLN
INTRAMUSCULAR | Status: AC
  Filled 2015-01-24: qty 5

## 2015-01-24 MED ORDER — ONDANSETRON HCL 4 MG/2ML IJ SOLN
INTRAMUSCULAR | Status: DC | PRN
  Administered 2015-01-24: 4 mg via INTRAVENOUS

## 2015-01-24 MED ORDER — PHENYLEPHRINE HCL 10 MG/ML IJ SOLN
INTRAMUSCULAR | Status: DC | PRN
  Administered 2015-01-24 (×2): 80 ug via INTRAVENOUS

## 2015-01-24 MED ORDER — HYDROMORPHONE HCL 2 MG PO TABS: 2.0000 mg | ORAL_TABLET | ORAL | Status: DC | PRN

## 2015-01-24 MED ORDER — ONDANSETRON HCL 4 MG PO TABS: 4.0000 mg | ORAL_TABLET | Freq: Three times a day (TID) | ORAL | Status: DC | PRN

## 2015-01-24 MED ORDER — SODIUM CHLORIDE 0.9 % IR SOLN
Status: DC | PRN
  Administered 2015-01-24 (×2): 6000 mL

## 2015-01-24 MED ORDER — GLYCOPYRROLATE 0.2 MG/ML IJ SOLN
INTRAMUSCULAR | Status: AC
  Filled 2015-01-24: qty 1

## 2015-01-24 MED ORDER — FENTANYL CITRATE (PF) 100 MCG/2ML IJ SOLN
INTRAMUSCULAR | Status: DC | PRN
Start: 2015-01-24 — End: 2015-01-24
  Administered 2015-01-24: 100 ug via INTRAVENOUS
  Administered 2015-01-24: 50 ug via INTRAVENOUS

## 2015-01-24 MED ORDER — EPHEDRINE SULFATE 50 MG/ML IJ SOLN
INTRAMUSCULAR | Status: DC | PRN
  Administered 2015-01-24: 10 mg via INTRAVENOUS
  Administered 2015-01-24 (×2): 5 mg via INTRAVENOUS
  Administered 2015-01-24 (×2): 10 mg via INTRAVENOUS

## 2015-01-24 MED ORDER — CYCLOBENZAPRINE HCL 10 MG PO TABS: 10.0000 mg | ORAL_TABLET | Freq: Three times a day (TID) | ORAL | Status: DC | PRN

## 2015-01-24 MED ORDER — ONDANSETRON HCL 4 MG/2ML IJ SOLN
INTRAMUSCULAR | Status: AC
  Filled 2015-01-24: qty 2

## 2015-01-24 SURGICAL SUPPLY — 77 items
ANCH SUT SWLK 19.1X4.75 VT (Anchor) ×1 IMPLANT
ANCH SUT SWLK 19.1X5.5 CLS EL (Anchor) ×2 IMPLANT
ANCHOR PEEK 4.75X19.1 SWLK C (Anchor) ×2 IMPLANT
ANCHOR PEEK SWIVEL LOCK 5.5 (Anchor) ×6 IMPLANT
BLADE CUTTER GATOR 3.5 (BLADE) ×3 IMPLANT
BLADE GREAT WHITE 4.2 (BLADE) ×2 IMPLANT
BLADE GREAT WHITE 4.2MM (BLADE) ×1
BLADE SURG 11 STRL SS (BLADE) ×3 IMPLANT
BOOTCOVER CLEANROOM LRG (PROTECTIVE WEAR) ×6 IMPLANT
BUR OVAL 4.0 (BURR) ×3 IMPLANT
CANISTER SUCT LVC 12 LTR MEDI- (MISCELLANEOUS) ×3 IMPLANT
CANNULA ACUFLEX KIT 5X76 (CANNULA) ×3 IMPLANT
CANNULA DRILOCK 5.0MMX75MM (CANNULA) ×2
CANNULA DRILOCK 5.0X75 (CANNULA) ×3 IMPLANT
CANNULA TWIST IN 8.25X7CM (CANNULA) ×2 IMPLANT
CLOSURE STERI-STRIP 1/2X4 (GAUZE/BANDAGES/DRESSINGS) ×1
CLOSURE WOUND 1/2 X4 (GAUZE/BANDAGES/DRESSINGS) ×1
CLSR STERI-STRIP ANTIMIC 1/2X4 (GAUZE/BANDAGES/DRESSINGS) ×1 IMPLANT
CONNECTOR 5 IN 1 STRAIGHT STRL (MISCELLANEOUS) ×3 IMPLANT
DRAPE INCISE 23X17 IOBAN STRL (DRAPES)
DRAPE INCISE 23X17 STRL (DRAPES) IMPLANT
DRAPE INCISE IOBAN 23X17 STRL (DRAPES) IMPLANT
DRAPE INCISE IOBAN 66X45 STRL (DRAPES) ×3 IMPLANT
DRAPE ORTHO SPLIT 77X108 STRL (DRAPES) ×6
DRAPE STERI 35X30 U-POUCH (DRAPES) IMPLANT
DRAPE SURG 17X11 SM STRL (DRAPES) ×3 IMPLANT
DRAPE SURG ORHT 6 SPLT 77X108 (DRAPES) ×2 IMPLANT
DRAPE U-SHAPE 47X51 STRL (DRAPES) IMPLANT
DRSG PAD ABDOMINAL 8X10 ST (GAUZE/BANDAGES/DRESSINGS) ×6 IMPLANT
DURAPREP 26ML APPLICATOR (WOUND CARE) ×6 IMPLANT
ELECT REM PT RETURN 9FT ADLT (ELECTROSURGICAL) ×3
ELECTRODE REM PT RTRN 9FT ADLT (ELECTROSURGICAL) ×1 IMPLANT
GAUZE SPONGE 4X4 12PLY STRL (GAUZE/BANDAGES/DRESSINGS) ×3 IMPLANT
GLOVE BIO SURGEON STRL SZ7.5 (GLOVE) ×3 IMPLANT
GLOVE BIO SURGEON STRL SZ8 (GLOVE) ×3 IMPLANT
GLOVE EUDERMIC 7 POWDERFREE (GLOVE) ×3 IMPLANT
GLOVE SS BIOGEL STRL SZ 7.5 (GLOVE) ×1 IMPLANT
GLOVE SUPERSENSE BIOGEL SZ 7.5 (GLOVE) ×2
GOWN STRL REUS W/ TWL LRG LVL3 (GOWN DISPOSABLE) ×1 IMPLANT
GOWN STRL REUS W/ TWL XL LVL3 (GOWN DISPOSABLE) ×2 IMPLANT
GOWN STRL REUS W/TWL LRG LVL3 (GOWN DISPOSABLE) ×3
GOWN STRL REUS W/TWL XL LVL3 (GOWN DISPOSABLE) ×6
KIT BASIN OR (CUSTOM PROCEDURE TRAY) ×3 IMPLANT
KIT ROOM TURNOVER OR (KITS) ×3 IMPLANT
KIT SHOULDER TRACTION (DRAPES) ×3 IMPLANT
MANIFOLD NEPTUNE II (INSTRUMENTS) ×3 IMPLANT
NDL SCORPION MULTI FIRE (NEEDLE) IMPLANT
NDL SPNL 18GX3.5 QUINCKE PK (NEEDLE) ×1 IMPLANT
NDL SUT 6 .5 CRC .975X.05 MAYO (NEEDLE) IMPLANT
NEEDLE MAYO TAPER (NEEDLE)
NEEDLE SCORPION MULTI FIRE (NEEDLE) ×3 IMPLANT
NEEDLE SPNL 18GX3.5 QUINCKE PK (NEEDLE) ×3 IMPLANT
NS IRRIG 1000ML POUR BTL (IV SOLUTION) ×3 IMPLANT
PACK SHOULDER (CUSTOM PROCEDURE TRAY) ×3 IMPLANT
PAD ARMBOARD 7.5X6 YLW CONV (MISCELLANEOUS) ×6 IMPLANT
SET ARTHROSCOPY TUBING (MISCELLANEOUS) ×3
SET ARTHROSCOPY TUBING LN (MISCELLANEOUS) ×1 IMPLANT
SLING ARM FOAM STRAP LRG (SOFTGOODS) ×2 IMPLANT
SLING ARM LRG ADULT FOAM STRAP (SOFTGOODS) IMPLANT
SLING ARM MED ADULT FOAM STRAP (SOFTGOODS) ×3 IMPLANT
SPONGE GAUZE 4X4 12PLY STER LF (GAUZE/BANDAGES/DRESSINGS) ×2 IMPLANT
SPONGE LAP 4X18 X RAY DECT (DISPOSABLE) IMPLANT
STRIP CLOSURE SKIN 1/2X4 (GAUZE/BANDAGES/DRESSINGS) ×2 IMPLANT
SUT MNCRL AB 3-0 PS2 18 (SUTURE) ×3 IMPLANT
SUT PDS AB 0 CT 36 (SUTURE) IMPLANT
SUT PDS AB 1 CT  36 (SUTURE) ×2
SUT PDS AB 1 CT 36 (SUTURE) IMPLANT
SUT RETRIEVER GRASP 30 DEG (SUTURE) IMPLANT
SUT TIGER TAPE 7 IN WHITE (SUTURE) ×2 IMPLANT
SYR 20CC LL (SYRINGE) IMPLANT
TAPE FIBER 2MM 7IN #2 BLUE (SUTURE) ×2 IMPLANT
TAPE PAPER 2X10 WHT MICROPORE (GAUZE/BANDAGES/DRESSINGS) ×2 IMPLANT
TAPE PAPER 3X10 WHT MICROPORE (GAUZE/BANDAGES/DRESSINGS) ×3 IMPLANT
TOWEL OR 17X24 6PK STRL BLUE (TOWEL DISPOSABLE) ×3 IMPLANT
TOWEL OR 17X26 10 PK STRL BLUE (TOWEL DISPOSABLE) ×3 IMPLANT
WAND SUCTION MAX 4MM 90S (SURGICAL WAND) ×3 IMPLANT
WATER STERILE IRR 1000ML POUR (IV SOLUTION) ×3 IMPLANT

## 2015-01-24 NOTE — H&P (Signed)
Gloria Lewis    Chief Complaint: RIGHT SHOULDER IMPINGEMENT HPI: The patient is a 59 y.o. female with chronic right shoulder impingment and symptoms refractory to conservative mangement.  Past Medical History  Diagnosis Date  . HTN (hypertension)   . Bipolar 1 disorder ([place])   . OA (osteoarthritis)   . GERD (gastroesophageal reflux disease)   . Hypercholesterolemia   . Fever blister   . HA (headache)   . Colon polyp   . CTS (carpal tunnel syndrome)   . Schizo-affective psychosis ([place])   . Anxiety   . Depression   . Migraines     Past Surgical History  Procedure Laterality Date  . Neck surgery  2009  . Carpal tunnel release  20110 rt/lt  . Polp removed  2011  . Back injection    . 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 Anterior cervical decomp/discectomy fusion  08/27/2011    Procedure: ANTERIOR CERVICAL DECOMPRESSION/DISCECTOMY FUSION 1 LEVEL/HARDWARE REMOVAL;  Surgeon: Eustace Moore, MD;  Location: [facility];  Service: Neurosurgery;  Laterality: Bilateral;  Cervical four-five Anterior cervical decompression/diskectomy, fusion, Plate, Removal of Cervical five-seven Plate  . Hemorrhoid surgery    . Cardiac catheterization N/A 11/09/2014    Procedure: Right Heart Cath;  Surgeon: Larey Dresser, MD;  Location: [facility];  Service: Cardiovascular;  Laterality: N/A;    Family History  Problem Relation Age of Onset  . Coronary artery disease Father   . Hypertension Mother   . Schizophrenia Mother   . Depression Brother   . Prostate cancer Brother   . Anesthesia problems Neg Hx   . Hypotension Neg Hx   . Malignant hyperthermia Neg Hx   . Pseudochol deficiency Neg Hx   . Cancer Father     head neck     Social History:  reports that she has quit smoking. Her smoking use included Cigarettes. She has a 3 pack-year smoking history. She has never used smokeless tobacco. She reports that she does not drink alcohol or use illicit  drugs.  Allergies:  Allergies  Allergen Reactions  . Amoxicillin Itching  . Chantix [Varenicline Tartrate] Nausea Only  . Effexor [Venlafaxine Hydrochloride] Itching and Other (See Comments)    headache  . Norco [Hydrocodone-Acetaminophen] Itching  . Paroxetine Hcl Other (See Comments)    headache  . Penicillins Hives  . Tramadol     Pt states it interacted with her sertraline, but she is no longer on sertraline.  She does not remember the type of reaction she had.   . Zithromax [Azithromycin Dihydrate] Swelling  . Hydrocodone Other (See Comments)    headache    Medications Prior to Admission  Medication Sig Dispense Refill  . amLODipine (NORVASC) 5 MG tablet Take 5 mg by mouth daily.    Marland Kitchen aspirin EC 81 MG EC tablet Take 1 tablet (81 mg total) by mouth daily. 30 tablet 0  . atorvastatin (LIPITOR) 10 MG tablet Take 10 mg by mouth daily.    . budesonide-formoterol (SYMBICORT) 160-4.5 MCG/ACT inhaler Inhale 2 puffs into the lungs 2 (two) times daily as needed (for shortness of breath and wheezing).     . cholecalciferol (VITAMIN D) 1000 UNITS tablet Take 1,000 Units by mouth daily.    . cyclobenzaprine (FLEXERIL) 10 MG tablet Take 10 mg by mouth 3 (three) times daily.     . diazepam (VALIUM) 5 MG tablet Take 1 tablet (  5 mg total) by mouth 4 (four) times daily. 120 tablet 4  . fluticasone (FLONASE) 50 MCG/ACT nasal spray Place 2 sprays into both nostrils daily as needed for allergies.   5  . folic acid (FOLVITE) 1 MG tablet Take 1 mg by mouth daily.    . Levomilnacipran HCl ER 40 MG CP24 Take 40 mg by mouth daily. 30 capsule 5  . Linaclotide (LINZESS) 145 MCG CAPS capsule Take 145 mcg by mouth as needed.    . loratadine (CLARITIN) 10 MG tablet Take 10 mg by mouth daily as needed for allergies.   12  . Multiple Vitamins-Minerals (MULTIVITAMIN WITH MINERALS) tablet Take 1 tablet by mouth every morning.     . nebivolol (BYSTOLIC) 10 MG tablet Take 1 tablet (10 mg total) by mouth daily. 30  tablet 1  . nicotine (NICOTROL) 10 MG inhaler Inhale 1 continuous puffing into the lungs daily.     . nortriptyline (PAMELOR) 25 MG capsule Take 1 capsule (25 mg total) by mouth at bedtime. 30 capsule 8  . pantoprazole (PROTONIX) 40 MG tablet Take 40 mg by mouth daily before breakfast.   12  . potassium chloride SA (K-DUR,KLOR-CON) 20 MEQ tablet Take 40 mEq by mouth daily as needed (hypokalemia).     . sucralfate (CARAFATE) 1 G tablet Take 1 tablet (1 g total) by mouth 3 (three) times daily with meals. 90 tablet 0  . triamterene-hydrochlorothiazide (MAXZIDE-25) 37.5-25 MG per tablet Take 1 tablet by mouth daily.    . valACYclovir (VALTREX) 1000 MG tablet Take 1,000 mg by mouth daily as needed (for out breaks).     . vitamin B-12 (CYANOCOBALAMIN) 1000 MCG tablet Take 1,000 mcg by mouth daily.    Marland Kitchen zolpidem (AMBIEN CR) 12.5 MG CR tablet Take 1 tablet (12.5 mg total) by mouth at bedtime as needed for sleep. 30 tablet 5  . docusate sodium (COLACE) 100 MG capsule Take 100 mg by mouth daily as needed for mild constipation.       Physical Exam: right shoulder with painful and restricted motion as noted at recent office vistis  Vitals  Temp:  [98.2 F (36.8 C)] 98.2 F (36.8 C) (10/20 0600) Pulse Rate:  [62] 62 (10/20 0600) Resp:  [20] 20 (10/20 0600) BP: (93)/(70) 93/70 mmHg (10/20 0600) SpO2:  [100 %] 100 % (10/20 0600) Weight:  [100.562 kg (221 lb 11.2 oz)] 100.562 kg (221 lb 11.2 oz) (10/20 0600)  Assessment/Plan  Impression: RIGHT SHOULDER IMPINGEMENT  Plan of Action: Procedure(s): RIGHT SHOULDER ARTHROSCOPY WITH SUBACROMIAL DECOMPRESSION AD DISTAL CLAVICLE RESECTION   Gloria Lewis 01/24/2015, 6:26 AM Contact # [phone]

## 2015-01-24 NOTE — Anesthesia Postprocedure Evaluation (Signed)
Anesthesia Post Note  Patient: Gloria Lewis  Procedure(s) Performed: Procedure(s) (LRB): RIGHT SHOULDER ARTHROSCOPY WITH SUBACROMIAL DECOMPRESSION AD DISTAL CLAVICLE RESECTION  (Right)  Anesthesia type: general  Patient location: PACU  Post pain: Pain level controlled  Post assessment: Patient's Cardiovascular Status Stable  Last Vitals:  Filed Vitals:   01/24/15 1117  BP:   Pulse:   Temp: 36.2 C  Resp:     Post vital signs: Reviewed and stable  Level of consciousness: sedated  Complications: No apparent anesthesia complications

## 2015-01-24 NOTE — Anesthesia Procedure Notes (Addendum)
Anesthesia Regional Block:  Interscalene brachial plexus block  Pre-Anesthetic Checklist: ,, timeout performed, Correct Patient, Correct Site, Correct Laterality, Correct Procedure, Correct Position, site marked, Risks and benefits discussed,  Surgical consent,  Pre-op evaluation,  At surgeon's request and post-op pain management  Laterality: Right  Prep: chloraprep       Needles:  Injection technique: Single-shot  Needle Type: Other     Needle Length: 9cm 9 cm Needle Gauge: 21 and 21 G  Needle insertion depth: 5 cm   Additional Needles:  Procedures: nerve stimulator Interscalene brachial plexus block Narrative:  Start time: 01/24/2015 7:10 AM End time: 01/24/2015 7:20 AM Injection made incrementally with aspirations every 5 mL.  Performed by: Personally  Anesthesiologist: Lillia Abed  Additional Notes: Monitors applied. Patient sedated. Sterile prep and drape,hand hygiene and sterile gloves were used. Needle position confirmed with evoked response at 0.4 mV.Local anesthetic injected incrementally after negative aspiration.Vascular puncture avoided. No complications. The patient tolerated the procedure well.        Procedure Name: Intubation Date/Time: 01/24/2015 7:39 AM Performed by: Scheryl Darter Pre-anesthesia Checklist: Patient identified, Emergency Drugs available, Suction available, Patient being monitored and Timeout performed Patient Re-evaluated:Patient Re-evaluated prior to inductionOxygen Delivery Method: Circle system utilized Preoxygenation: Pre-oxygenation with 100% oxygen Intubation Type: IV induction Ventilation: Mask ventilation without difficulty Laryngoscope Size: Miller and 3 Grade View: Grade I Tube type: Oral Tube size: 7.5 mm Number of attempts: 1 Airway Equipment and Method: Stylet Placement Confirmation: ETT inserted through vocal cords under direct vision,  positive ETCO2 and breath sounds checked- equal and bilateral Secured at: 23  cm Tube secured with: Tape Dental Injury: Teeth and Oropharynx as per pre-operative assessment

## 2015-01-24 NOTE — Anesthesia Preprocedure Evaluation (Signed)
Anesthesia Evaluation  Patient identified by MRN, date of birth, ID band Patient awake    Reviewed: Allergy & Precautions, NPO status , Patient's Chart, lab work & pertinent test results  Airway Mallampati: II  TM Distance: >3 FB Neck ROM: Full    Dental   Pulmonary former smoker,    Pulmonary exam normal        Cardiovascular hypertension, Pt. on medications Normal cardiovascular exam  ECHO 08/2014 Study Conclusions  - Left ventricle: The cavity size was normal. Systolic function was normal. The estimated ejection fraction was in the range of 55% to 60%. There is hypokinesis of the mid-apicalinferior myocardium. Doppler parameters are consistent with abnormal left ventricular relaxation (grade 1 diastolic dysfunction). - Right ventricle: The cavity size was mildly dilated. Wall thickness was normal. - Right atrium: The atrium was mildly dilated. - Atrial septum: No defect or patent foramen ovale was identified with bubble study. - Tricuspid valve: There was moderate regurgitation. - Pulmonary arteries: Systolic pressure was mildly increased. PA peak pressure: 42 mm Hg (S).  Impressions:  - Compared to the prior study, there has been no significant interval change.     Neuro/Psych Anxiety Depression Bipolar Disorder    GI/Hepatic GERD  Medicated and Controlled,  Endo/Other    Renal/GU      Musculoskeletal   Abdominal   Peds  Hematology   Anesthesia Other Findings   Reproductive/Obstetrics                             Anesthesia Physical Anesthesia Plan  ASA: III  Anesthesia Plan: General   Post-op Pain Management: MAC Combined w/ Regional for Post-op pain   Induction: Intravenous  Airway Management Planned: Oral ETT  Additional Equipment:   Intra-op Plan:   Post-operative Plan: Extubation in OR  Informed Consent: I have reviewed the patients History  and Physical, chart, labs and discussed the procedure including the risks, benefits and alternatives for the proposed anesthesia with the patient or authorized representative who has indicated his/her understanding and acceptance.     Plan Discussed with: CRNA and Surgeon  Anesthesia Plan Comments:         Anesthesia Quick Evaluation

## 2015-01-24 NOTE — Transfer of Care (Signed)
Immediate Anesthesia Transfer of Care Note  Patient: Gloria Lewis  Procedure(s) Performed: Procedure(s): RIGHT SHOULDER ARTHROSCOPY WITH SUBACROMIAL DECOMPRESSION AD DISTAL CLAVICLE RESECTION  (Right)  Patient Location: PACU  Anesthesia Type:General and Regional  Level of Consciousness: awake, alert , oriented and sedated  Airway & Oxygen Therapy: Patient Spontanous Breathing and Patient connected to nasal cannula oxygen  Post-op Assessment: Report given to RN, Post -op Vital signs reviewed and stable and Patient moving all extremities  Post vital signs: Reviewed and stable  Last Vitals:  Filed Vitals:   01/24/15 0600  BP: 93/70  Pulse: 62  Temp: 36.8 C  Resp: 20    Complications: No apparent anesthesia complications

## 2015-01-24 NOTE — Op Note (Signed)
01/24/2015  9:20 AM  PATIENT:   Gloria Lewis  59 y.o. female  PRE-OPERATIVE DIAGNOSIS:  RIGHT SHOULDER IMPINGEMENT, ac joint OA  POST-OPERATIVE DIAGNOSIS:  Same with labral tear, bicep tearing, chondromalacia, rotator cuff tear  PROCEDURE:  RSA, labral debridement, chondroplasty, bicep tenotomy, SAD, DCR, RCR  SURGEON:  Zelig Gacek, Metta Clines M.D.  ASSISTANTS: Shuford pac   ANESTHESIA:   GET + ISB  EBL: min  SPECIMEN:  none  Drains: none   PATIENT DISPOSITION:  PACU - hemodynamically stable.    PLAN OF CARE: Discharge to home after PACU  Dictation# [id number]   Contact # [phone]

## 2015-01-24 NOTE — Op Note (Signed)
NAME:  Gloria Lewis, Gloria Lewis NO.:  [id number]  MEDICAL RECORD NO.:  [id number]  LOCATION:  [facility]                         FACILITY:  [facility]  PHYSICIAN:  Metta Clines. Saurav Crumble, M.D.  DATE OF BIRTH:  [birth year]  DATE OF PROCEDURE:  01/24/2015 DATE OF DISCHARGE:                              OPERATIVE REPORT   PREOPERATIVE DIAGNOSES: 1. Chronic right shoulder pain with impingement syndrome. 2. Right shoulder symptomatic AC joint arthropathy.  POSTOPERATIVE DIAGNOSES: 1. Chronic right shoulder pain with impingement syndrome. 2. Right shoulder symptomatic AC joint arthropathy. 3. Complex and extensive degenerative labral tear. 4. Biceps tendon partial tear. 5. Chondromalacia of the glenoid. 6. Rotator cuff tear.  PROCEDURE: 1. Right shoulder examination under anesthesia. 2. Right shoulder glenohumeral joint diagnostic arthroscopy. 3. Labral debridement. 4. Chondroplasty of the glenoid. 5. Biceps tendon tenotomy. 6. Arthroscopic subacromial decompression and bursectomy. 7. Arthroscopic distal clavicle resection. 8. Arthroscopic rotator cuff repair using double-row suture bridge     repair construct.  SURGEON:  Metta Clines. Charels Stambaugh, M.D.  Terrence DupontOlivia Mackie A. Shuford, PA-C.  ANESTHESIA:  General endotracheal as well as interscalene block.  ESTIMATED BLOOD LOSS:  Minimal.  DRAINS:  None.  HISTORY:  Gloria Lewis is a 59 year old female with a number of underlying chronic medical comorbidities who has had continued right shoulder pain which has been refractory to prolonged attempts at conservative management.  Examination shows a very painful and restricted range of motion with global weakness with an MRI scan showing marked rotator cuff tendinopathy as well as bony impingement and AC joint arthrosis, but no obvious discrete full-thickness rotator cuff tear based on MRI criteria.  Due to her ongoing pain and functional limitations and failure to respond to conservative  management, she is brought to the operating room at this time for planned right shoulder arthroscopy as described below.  Preoperatively, I counseled Gloria Lewis regarding on treatment options as well as potential risks versus benefits thereof.  Possible surgical complications were all reviewed including bleeding infection, neurovascular injury, persistent pain, loss of motion, anesthetic complication, recurrence of rotator cuff tear and possible need for additional surgery.  She understands and accepts and agrees with our planned procedure.  PROCEDURE IN DETAIL:  After undergoing routine preop evaluation, the patient received prophylactic antibiotics.  An interscalene block was established in the holding area by the Anesthesia Department.  She was placed supine on the operating table, underwent smooth induction of a general endotracheal anesthesia.  Turned to left lateral decubitus position on a beanbag and appropriately padded and protected.  Right shoulder examination under anesthesia revealed full motion and no instability patterns were noted.  Right arm was suspended at 70 degrees of abduction with 10 pounds of traction.  The right shoulder girdle region was sterilely prepped and draped in standard fashion.  Time-out was called.  The posterior portal was established in glenohumeral joint. Anterior portal was established under direct visualization.  Immediately evident was diffuse synovitis and extensive degenerative labral tear in anterior and superior.  Performed a limited synovectomy.  Debridement of the complex and extensive labral tear.  The biceps tendon was carefully inspected and had a severe partial tearing distally towards the bicipital groove  felt to be at least 50% of the thickness of the tendon. Given the degree of degeneration, I performed a biceps tenotomy.  The glenoid showed broad grade 3 and 4 chondromalacia and the loose chondral flaps were all debrided with  shaver back to stable base.  The humeral head was in relatively good condition.  No instability patterns noted. The rotator cuff showed a significant at least partial articular sided tear which we debrided.  The tag suture of 0 PDS was then passed through the area of most significant deficiency of the rotator cuff.  At this point, fluid and instruments were then removed.  The arm was dropped down to 30 degrees of abduction.  Arthroscope introduced into the subacromial space to the posterior portal and a direct lateral portal was established in subacromial space.  Abundant dense bursal tissue and multiple adhesions were encountered and these were all divided and excised with a combination of shaver and a Stryker wand.  The wand was then used to remove the periosteum from the undersurface of the anterior half of the acromion.  Then, a subacromial decompression was performed with a burr creating a type 1 morphology.  Portal was established directly into the distal clavicle and a distal clavicle resection was performed with a burr.  Care was taken to confirm visualization of the entire circumference of the distal clavicle to ensure adequate removal of bone.  I should mention that there was severe bony impingement found on the underlying rotator cuff.  This is all nicely decompressed.  At this point, inspection of the rotator cuff showed an obvious defect involving the distal supraspinatus region and the tag suture and the tag suture was removed.  We went ahead and debrided the rotator cuff, creating a defect, it was approximately 2 cm in width.  We prepared the greater tuberosity and removed the soft tissue and debrided the bone to bleeding bed.  Through a stab wound off lateral margin of the acromion, placed an Arthrex peak SwiveLock suture anchor loaded with 2 fiber tapes and this was introduced with excellent fixation and purchase.  We then shuttled the limbs of the fiber tapes through the  rotator cuff tear either constant equidistant across the width of the tear using the scorpion suture passer and then created a double-row construct with 2 lateral row anchors 5.5 SwiveLock, which nicely compressed the margin of the rotator cuff against the bony bed on tuberosity and overall construct was much to our satisfaction.  Suture limbs were then clipped. Bursectomy was completed.  Hemostasis was obtained.  Fluid and instruments were removed.  The portals were closed with Monocryl and Steri-Strips with a dry dressing taped at the right shoulder.  Right arm was placed in a sling.  The patient was awakened, extubated, and taken to the recovery room in stable condition.  Jenetta Loges, PA-C was used as an Environmental consultant throughout this case, essential for help with positioning of the patient, consider morbid obesity, positioning of extremity, management of the arthroscopic equipment, tissue manipulation, suture management and wound closure and intraoperative decision making.     Metta Clines. Mishelle Hassan, M.D.     KMS/MEDQ  D:  01/24/2015  T:  01/24/2015  Job:  [id number]

## 2015-01-24 NOTE — Discharge Instructions (Signed)
° °  Kevin M. Supple, M.D., F.A.A.O.S. °Orthopaedic Surgery °Specializing in Arthroscopic and Reconstructive °Surgery of the Shoulder and Knee °[phone] °[address] - [place], [place] [address] - Fax [phone] ° °POST-OP SHOULDER ARTHROSCOPIC ROTATOR CUFF  REPAIR INSTRUCTIONS ° °1. Call the office at [phone] to schedule your first post-op appointment 7-10 days from the date of your surgery. ° °2. Leave the steri-strips in place over your incisions when performing dressing changes and showering. You may remove your dressings and begin showering 72 hours from surgery. You can expect drainage that is clear to bloody in nature that occasionally will soak through your dressings. If this occurs go ahead and perform a dressing change. The drainage should lessen daily and when there is no drainage from your incisions feel free to go without a dressing. ° °3. Wear your sling/immobilizer at all times except to perform the exercises below or to occasionally let your arm dangle by your side to stretch your elbow. You also need to sleep in your sling immobilizer until instructed otherwise. ° °4. Range of motion to your elbow, wrist, and hand are encouraged 3-5 times daily. Exercise to your hand and fingers helps to reduce swelling you may experience. ° °5. Utilize ice to the shoulder 3-4 times minimum a day and additionally if you are experiencing pain. ° °6. You may one-armed drive when safely off of narcotics and muscle relaxants. You may use your hand that is in the sling to support the steering wheel only. However, should it be your right arm that is in the sling it is not to be used for gear shifting in a manual transmission. ° °7. If you had a block pre-operatively to provide post-op pain relief you may want to go ahead and begin utilizing your pain meds as your arm begins to wake up. Blocks can sometimes last up to 16-18 hours. If you are still pain-free prior to going to bed you may want to  strongly consider taking a pain medication to avoid being awakened in the night with the onset of pain. A muscle relaxant is also provided for you should you experience muscle spasms. It is recommended that if you are experiencing pain that your pain medication alone is not controlling, add the muscle relaxant along with the pain medication which can give additional pain relief. The first one to two days is generally the most severe of your pain and then should gradually decrease. As your pain lessens it is recommended that you decrease your use of the pain medications to an "as needed basis" only and to always comply with the recommended dosages of the pain medications. ° °8. Pain medications can produce constipation along with their use. If you experience this, the use of an over the counter stool softener or laxative daily is recommended.  ° °9. For additional questions or concerns, please do not hesitate to call the office. If after hours there is an answering service to forward your concerns to the physician on call. ° °POST-OP EXERCISES ° °Pendulum Exercises ° °Perform pendulum exercises while standing and bending at the waist. Support your uninvolved arm on a table or chair and allow your operated arm to hang freely. Make sure to do these exercises passively - not using you shoulder muscle. ° °Repeat 20 times. Do 3 sessions per day. ° ° °

## 2015-01-25 ENCOUNTER — Encounter (HOSPITAL_COMMUNITY): Payer: Self-pay | Admitting: Orthopedic Surgery

## 2015-02-06 ENCOUNTER — Ambulatory Visit: Payer: Medicaid Other | Attending: Orthopedic Surgery | Admitting: Physical Therapy

## 2015-02-06 DIAGNOSIS — M25612 Stiffness of left shoulder, not elsewhere classified: Secondary | ICD-10-CM

## 2015-02-06 DIAGNOSIS — M25512 Pain in left shoulder: Secondary | ICD-10-CM | POA: Diagnosis present

## 2015-02-06 DIAGNOSIS — R293 Abnormal posture: Secondary | ICD-10-CM

## 2015-02-06 DIAGNOSIS — M7582 Other shoulder lesions, left shoulder: Secondary | ICD-10-CM | POA: Diagnosis present

## 2015-02-06 DIAGNOSIS — R29898 Other symptoms and signs involving the musculoskeletal system: Secondary | ICD-10-CM

## 2015-02-06 NOTE — Therapy (Signed)
[place], [place], [address] Phone: [phone]   Fax:  [phone]  Physical Therapy Evaluation  Patient Details  Name: Gloria Lewis MRN: [id number] Date of Birth: Jun 18, 1955 Referring Provider: Dr. Justice Britain  Encounter Date: 02/06/2015      PT End of Session - 02/06/15 1229    Visit Number 1   Number of Visits 4   Date for PT Re-Evaluation 02/27/15   Authorization Type Medicaid   Authorization - Visit Number 1   Authorization - Number of Visits 4   PT Start Time 7096   PT Stop Time 1230   PT Time Calculation (min) 45 min   Activity Tolerance Patient tolerated treatment well   Behavior During Therapy [facility] for tasks assessed/performed      Past Medical History  Diagnosis Date  . HTN (hypertension)   . Bipolar 1 disorder ([place])   . OA (osteoarthritis)   . GERD (gastroesophageal reflux disease)   . Hypercholesterolemia   . Fever blister   . HA (headache)   . Colon polyp   . CTS (carpal tunnel syndrome)   . Schizo-affective psychosis ([place])   . Anxiety   . Depression   . Migraines     Past Surgical History  Procedure Laterality Date  . Neck surgery  2009  . Carpal tunnel release  20110 rt/lt  . Polp removed  2011  . Back injection    . 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 Anterior cervical decomp/discectomy fusion  08/27/2011    Procedure: ANTERIOR CERVICAL DECOMPRESSION/DISCECTOMY FUSION 1 LEVEL/HARDWARE REMOVAL;  Surgeon: Eustace Moore, MD;  Location: [facility];  Service: Neurosurgery;  Laterality: Bilateral;  Cervical four-five Anterior cervical decompression/diskectomy, fusion, Plate, Removal of Cervical five-seven Plate  . Hemorrhoid surgery    . Cardiac catheterization N/A 11/09/2014    Procedure: Right Heart Cath;  Surgeon: Larey Dresser, MD;  Location: [facility];  Service: Cardiovascular;  Laterality: N/A;  . Shoulder  arthroscopy with subacromial decompression Right 01/24/2015    Procedure: RIGHT SHOULDER ARTHROSCOPY WITH SUBACROMIAL DECOMPRESSION AD DISTAL CLAVICLE RESECTION ;  Surgeon: Justice Britain, MD;  Location: [facility];  Service: Orthopedics;  Laterality: Right;    There were no vitals filed for this visit.  Visit Diagnosis:  Left shoulder pain - Plan: PT plan of care cert/re-cert  Weakness of left arm - Plan: PT plan of care cert/re-cert  Decreased ROM of left shoulder - Plan: PT plan of care cert/re-cert  Abnormal posture - Plan: PT plan of care cert/re-cert      Subjective Assessment - 02/06/15 1157    Subjective pt is a 59 y.o F s/p R shoulder arthroscopy with RC repair and labral repair on 01/24/2015. since surgery she reports things haven't been doing too well possibly due to new pain per pt report. she reports being consistance with wearing the sling   Limitations Lifting   How long can you sit comfortably? 30 min   How long can you stand comfortably? 10 min   How long can you walk comfortably? 10 min   Diagnostic tests 01/24/2015 MRI found larbral tear and complete RC tear   Patient Stated Goals help to get back in the water   Currently in Pain? Yes   Pain Score 8   last took pain medication at 8am   Pain Location Shoulder   Pain Orientation Right  Pain Descriptors / Indicators Aching   Pain Type Surgical pain   Pain Onset 1 to 4 weeks ago   Pain Frequency Constant   Aggravating Factors  laying down, moving too quickly/abruptly, dressing   Pain Relieving Factors pain medication, ice, praying            [facility] PT Assessment - 02/06/15 1202    Assessment   Medical Diagnosis s/p R shoulder rotator cuff and labral repair   Referring Provider Dr. Justice Britain   Onset Date/Surgical Date 01/24/15   Hand Dominance Right   Next MD Visit 03/14/2015   Prior Therapy yes   Precautions   Precautions Shoulder   Type of Shoulder Precautions rotator cuff repair    Restrictions    Weight Bearing Restrictions No   Balance Screen   Has the patient fallen in the past 6 months No   Has the patient had a decrease in activity level because of a fear of falling?  No   Is the patient reluctant to leave their home because of a fear of falling?  No   Home Environment   Living Environment Private residence   Living Arrangements Alone   Type of [place] to enter   Entrance Stairs-Number of Steps 6   Home Layout Two level   Alternate Level Stairs-Number of Steps 6   Prior Function   Level of Independence Independent;Independent with basic ADLs   Vocation On disability   Leisure reading, swimming, feeding the homeless   Cognition   Overall Cognitive Status Within Functional Limits for tasks assessed   Observation/Other Assessments   Focus on Therapeutic Outcomes (FOTO)  37% limited  predicted 42% limited   Posture/Postural Control   Posture/Postural Control Postural limitations   Postural Limitations Rounded Shoulders;Forward head   ROM / Strength   AROM / PROM / Strength AROM;PROM;Strength   AROM   Overall AROM  Unable to assess;Other (comment)  due to shoulder precautions   AROM Assessment Site Shoulder   Right/Left Shoulder Right;Left   Left Shoulder Extension 30 Degrees   Left Shoulder Flexion 134 Degrees   Left Shoulder ABduction 100 Degrees   Left Shoulder Internal Rotation 50 Degrees   Left Shoulder External Rotation 60 Degrees   PROM   PROM Assessment Site Shoulder   Right/Left Shoulder Right;Left   Right Shoulder Extension 10 Degrees   Right Shoulder Flexion 66 Degrees   Right Shoulder ABduction 45 Degrees   Right Shoulder Internal Rotation 15 Degrees   Right Shoulder External Rotation 10 Degrees   Strength   Overall Strength Comments R shoulder strength not assess due to precautions   Strength Assessment Site Shoulder   Right/Left Shoulder Right;Left   Left Shoulder Flexion 4+/5   Left Shoulder Extension 4+/5   Left  Shoulder ABduction 4+/5   Left Shoulder Internal Rotation 4+/5   Left Shoulder External Rotation 4+/5   Palpation   Palpation comment tendneress in the anterior aspect of the R shoulder with tightness of the upper trap with referal of pain down to the mid-proximial humerus                           PT Education - 02/06/15 1229    Education provided Yes   Education Details evaluation findings, POC, goals, HEP, rotator cuff precautions   Person(s) Educated Patient   Methods Explanation   Comprehension Verbalized understanding  PT Short Term Goals - 02/06/15 1234    PT SHORT TERM GOAL #1   Title STG=LTG           PT Long Term Goals - 02/06/15 1234    PT LONG TERM GOAL #1   Title pt will be I with all HEP given (02/27/2015)   Baseline no previous HEP   Time 3   Period Weeks   Status New   PT LONG TERM GOAL #2   Title pt will increase her PROM of the r shoulder by > 20 degrees with flexion abduction to assist with ADLs (02/27/2015)   Time 3   Period Weeks   Status New   PT LONG TERM GOAL #3   Title pt will be able to verbalize and demonstrate techniques to reduce shoulder pain and inflammation via RICE (02/27/2015)   Baseline no previous knowledge of how to control pain and inflammation   Time 3   Period Weeks   Status New   PT LONG TERM GOAL #4   Title she will increase her FOTO score to > 45 to demonstrate improved function at discharge (02/27/2015)   Baseline initial FOTO score 33   Time 3   Period Weeks   Status New               Plan - 02/06/15 1230    Clinical Impression Statement Ms. Neuberger presents to OPPT s/p R shoulder athroscopy RC and labral repair  on 01/24/2015. The incision site appears to be clean and healing well. She demonstrates limited shoulder PROM due to pain and stiffness in the shoulder. AROM and strengthening weren't assessed due to [facility] precautions. She would benefit from physical therapy to decrease pain  improve mobility and return back to PLOF by addressing the impairments listed.   CPT code [phone]   Pt will benefit from skilled therapeutic intervention in order to improve on the following deficits Pain;Improper body mechanics;Postural dysfunction;Decreased strength;Decreased mobility;Hypomobility;Impaired flexibility;Impaired UE functional use;Decreased endurance;Decreased activity tolerance;Increased edema   Rehab Potential Good   PT Frequency 1x / week   PT Duration 3 weeks   PT Treatment/Interventions ADLs/Self Care Home Management;Cryotherapy;Electrical Stimulation;Iontophoresis 4mg /ml Dexamethasone;Moist Heat;Ultrasound;Therapeutic activities;Therapeutic exercise;Taping;Vasopneumatic Device;Passive range of motion;Patient/family education;Manual techniques   PT Next Visit Plan assess response to HEP, PROM for shoulder, scapualr stability exercises, modalities PRN   PT Home Exercise Plan pendulums, wand flexion/ abduction    Consulted and Agree with Plan of Care Patient         Problem List Patient Active Problem List   Diagnosis Date Noted  . Chronic migraine without aura without status migrainosus, not intractable 10/18/2014  . Tobacco abuse 10/18/2014  . Obesity 09/23/2014  . COPD GOLD 0 still smoking  09/02/2014  . Pulmonary hypertension ([place]) 08/31/2014  . Respiratory failure with hypoxia ([place]) 08/14/2014  . Cigarette smoker 07/28/2014  . Major depressive disorder, recurrent episode, moderate ([place]) 07/06/2014  . Essential hypertension   . SOB (shortness of breath) 06/21/2014  . Precordial pain 06/21/2014  . GERD (gastroesophageal reflux disease) 06/21/2014  . Chest pain 06/21/2014  . HTN (hypertension)   . Neck pain 01/08/2014  . Major depressive disorder, recurrent episode, severe, without mention of psychotic behavior 10/14/2012  . Schizoaffective disorder ([place]) 05/26/2012  . Parathyroid adenoma 10/06/2010  . Hyperparathyroidism, primary ([place]) 10/06/2010  .  DEGENERATIVE DISC DISEASE, LUMBOSACRAL SPINE 05/21/2007  . DERMATOPHYTOSIS OF THE BODY 05/10/2007  . Depressive type psychosis ([place]) 03/24/2007  . Anxiety state 03/24/2007  .  DENTAL PAIN 03/24/2007  . SHOULDER PAIN, LEFT 03/24/2007   Starr Lake PT, DPT, LAT, ATC  02/06/2015  12:43 PM    [facility] [address] [place], [place], [address] Phone: [phone]   Fax:  [phone]  Name: LAKEITHA BASQUES MRN: [id number] Date of Birth: 12/23/1955

## 2015-02-06 NOTE — Patient Instructions (Signed)
   Kloe Oates PT, DPT, LAT, ATC  [facility] Phone: [phone]     

## 2015-02-13 ENCOUNTER — Ambulatory Visit: Payer: Medicaid Other | Admitting: Physical Therapy

## 2015-02-13 DIAGNOSIS — M25512 Pain in left shoulder: Secondary | ICD-10-CM | POA: Diagnosis not present

## 2015-02-13 DIAGNOSIS — R293 Abnormal posture: Secondary | ICD-10-CM

## 2015-02-13 DIAGNOSIS — M25612 Stiffness of left shoulder, not elsewhere classified: Secondary | ICD-10-CM

## 2015-02-13 DIAGNOSIS — R29898 Other symptoms and signs involving the musculoskeletal system: Secondary | ICD-10-CM

## 2015-02-13 NOTE — Therapy (Addendum)
[place], [place], [address] Phone: [phone]   Fax:  [phone]  Physical Therapy Treatment  Patient Details  Name: KYNNEDI ZWEIG MRN: [id number] Date of Birth: 1955-09-11 Referring Provider: Dr. Justice Britain  Encounter Date: 02/13/2015      PT End of Session - 02/13/15 1458    Visit Number 2   Number of Visits 4   Date for PT Re-Evaluation 02/27/15   Authorization Type Medicaid   PT Start Time 2025   PT Stop Time 1505   PT Time Calculation (min) 50 min   Activity Tolerance Patient tolerated treatment well   Behavior During Therapy [facility] for tasks assessed/performed      Past Medical History  Diagnosis Date  . HTN (hypertension)   . Bipolar 1 disorder ([place])   . OA (osteoarthritis)   . GERD (gastroesophageal reflux disease)   . Hypercholesterolemia   . Fever blister   . HA (headache)   . Colon polyp   . CTS (carpal tunnel syndrome)   . Schizo-affective psychosis ([place])   . Anxiety   . Depression   . Migraines     Past Surgical History  Procedure Laterality Date  . Neck surgery  2009  . Carpal tunnel release  20110 rt/lt  . Polp removed  2011  . Back injection    . 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 Anterior cervical decomp/discectomy fusion  08/27/2011    Procedure: ANTERIOR CERVICAL DECOMPRESSION/DISCECTOMY FUSION 1 LEVEL/HARDWARE REMOVAL;  Surgeon: Eustace Moore, MD;  Location: [facility];  Service: Neurosurgery;  Laterality: Bilateral;  Cervical four-five Anterior cervical decompression/diskectomy, fusion, Plate, Removal of Cervical five-seven Plate  . Hemorrhoid surgery    . Cardiac catheterization N/A 11/09/2014    Procedure: Right Heart Cath;  Surgeon: Larey Dresser, MD;  Location: [facility];  Service: Cardiovascular;  Laterality: N/A;  . Shoulder arthroscopy with subacromial decompression Right 01/24/2015    Procedure: RIGHT  SHOULDER ARTHROSCOPY WITH SUBACROMIAL DECOMPRESSION AD DISTAL CLAVICLE RESECTION ;  Surgeon: Justice Britain, MD;  Location: [facility];  Service: Orthopedics;  Laterality: Right;    There were no vitals filed for this visit.  Visit Diagnosis:  Left shoulder pain  Weakness of left arm  Decreased ROM of left shoulder  Abnormal posture      Subjective Assessment - 02/13/15 1423    Subjective "I am feeling really sore" pt reports she has bene consistent with her HEP.    Currently in Pain? Yes   Pain Score 8    Pain Location Shoulder   Pain Orientation Right   Pain Descriptors / Indicators Aching   Pain Type Surgical pain   Pain Onset 1 to 4 weeks ago   Pain Frequency Constant   Aggravating Factors  laying down, moving too quickly, dressing   Pain Relieving Factors pain medication, ice                         [facility] Adult PT Treatment/Exercise - 02/13/15 1424    Modalities   Modalities Moist Heat;Electrical Stimulation;Cryotherapy   Moist Heat Therapy   Number Minutes Moist Heat 15 Minutes   Moist Heat Location Shoulder  r shoulder in supine   Cryotherapy   Number Minutes Cryotherapy 10 Minutes   Cryotherapy Location Shoulder   Type of Cryotherapy Ice pack  R shoulder   Electrical  Stimulation   Electrical Stimulation Location R shoulder   Electrical Stimulation Action IFC   Electrical Stimulation Parameters 15 min, 100% scan L   Electrical Stimulation Goals Pain   Manual Therapy   Manual Therapy Joint mobilization;Passive ROM;Soft tissue mobilization   Joint Mobilization grade 2-3 joint mobs inferior/posteriorly/anteriorly with distraction   Passive ROM flexon/abduction with intermittent distraction  abduction 96 degrees, Flexion 90                PT Education - 02/13/15 1458    Education provided Yes   Education Details HEP review   Person(s) Educated Patient   Methods Explanation   Comprehension Verbalized understanding          PT Short  Term Goals - 02/06/15 1234    PT SHORT TERM GOAL #1   Title STG=LTG           PT Long Term Goals - 02/06/15 1234    PT LONG TERM GOAL #1   Title pt will be I with all HEP given (02/27/2015)   Baseline no previous HEP   Time 3   Period Weeks   Status New   PT LONG TERM GOAL #2   Title pt will increase her PROM of the r shoulder by > 20 degrees with flexion abduction to assist with ADLs (02/27/2015)   Time 3   Period Weeks   Status New   PT LONG TERM GOAL #3   Title pt will be able to verbalize and demonstrate techniques to reduce shoulder pain and inflammation via RICE (02/27/2015)   Baseline no previous knowledge of how to control pain and inflammation   Time 3   Period Weeks   Status New   PT LONG TERM GOAL #4   Title she will increase her FOTO score to > 45 to demonstrate improved function at discharge (02/27/2015)   Baseline initial FOTO score 33   Time 3   Period Weeks   Status New               Plan - 02/13/15 1459    Clinical Impression Statement Ms. whistler reports that she has been consistent with her HEP  but continues to report pain in the shoulder that has increased since the last visit. focused todays treatment to calm down her pain and improve her mobility. following todays session she reported decreaed pain and was able to make it to 90 degrees of abduciton passively.    PT Next Visit Plan PROM for shoulder, scapualr stability exercises, modalities PRN   PT Home Exercise Plan HEP review   Consulted and Agree with Plan of Care Patient        Problem List Patient Active Problem List   Diagnosis Date Noted  . Chronic migraine without aura without status migrainosus, not intractable 10/18/2014  . Tobacco abuse 10/18/2014  . Obesity 09/23/2014  . COPD GOLD 0 still smoking  09/02/2014  . Pulmonary hypertension ([place]) 08/31/2014  . Respiratory failure with hypoxia ([place]) 08/14/2014  . Cigarette smoker 07/28/2014  . Major depressive disorder,  recurrent episode, moderate ([place]) 07/06/2014  . Essential hypertension   . SOB (shortness of breath) 06/21/2014  . Precordial pain 06/21/2014  . GERD (gastroesophageal reflux disease) 06/21/2014  . Chest pain 06/21/2014  . HTN (hypertension)   . Neck pain 01/08/2014  . Major depressive disorder, recurrent episode, severe, without mention of psychotic behavior 10/14/2012  . Schizoaffective disorder ([place]) 05/26/2012  . Parathyroid adenoma 10/06/2010  . Hyperparathyroidism, primary (  [place]) 10/06/2010  . DEGENERATIVE DISC DISEASE, LUMBOSACRAL SPINE 05/21/2007  . DERMATOPHYTOSIS OF THE BODY 05/10/2007  . 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 Anxiety state 03/24/2007  . DENTAL PAIN 03/24/2007  . SHOULDER PAIN, LEFT 03/24/2007   Starr Lake PT, DPT, LAT, ATC  02/13/2015  3:07 PM    [facility] [address] [place], [place], [address] Phone: [phone]   Fax:  [phone]  Name: ROSSLYN PASION MRN: [id number] Date of Birth: [birth year]    PHYSICAL THERAPY DISCHARGE SUMMARY  Visits from Start of Care: 2  Current functional level related to goals / functional outcomes: See goals   Remaining deficits: Unknown due to pt not returning   Education / Equipment: HEP  Plan:                                                    Patient goals were not met. Patient is being discharged due to not returning since the last visit.  ?????        Kristoffer Leamon PT, DPT, LAT, ATC  03/29/2015  7:55 AM

## 2015-02-20 ENCOUNTER — Ambulatory Visit: Payer: Medicaid Other | Admitting: Physical Therapy

## 2015-02-27 ENCOUNTER — Encounter: Payer: Self-pay | Admitting: Physical Therapy

## 2015-03-20 ENCOUNTER — Ambulatory Visit (INDEPENDENT_AMBULATORY_CARE_PROVIDER_SITE_OTHER): Payer: Self-pay | Admitting: Psychiatry

## 2015-03-20 VITALS — BP 132/91 | HR 89 | Ht 67.0 in | Wt 225.0 lb

## 2015-03-20 DIAGNOSIS — F331 Major depressive disorder, recurrent, moderate: Secondary | ICD-10-CM

## 2015-03-20 MED ORDER — NORTRIPTYLINE HCL 25 MG PO CAPS: ORAL_CAPSULE | ORAL | Status: DC

## 2015-03-20 MED ORDER — DIAZEPAM 5 MG PO TABS: 5.0000 mg | ORAL_TABLET | Freq: Four times a day (QID) | ORAL | Status: DC

## 2015-03-20 MED ORDER — LEVOMILNACIPRAN HCL ER 40 MG PO CP24: 40.0000 mg | ORAL_CAPSULE | Freq: Every day | ORAL | Status: DC

## 2015-03-20 MED ORDER — ZOLPIDEM TARTRATE ER 12.5 MG PO TBCR: 12.5000 mg | EXTENDED_RELEASE_TABLET | Freq: Every evening | ORAL | Status: DC | PRN
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
## 2015-03-21 ENCOUNTER — Encounter (HOSPITAL_COMMUNITY): Payer: Self-pay | Admitting: *Deleted

## 2015-03-21 ENCOUNTER — Emergency Department (HOSPITAL_COMMUNITY)
Admission: EM | Admit: 2015-03-21 | Discharge: 2015-03-21 | Disposition: A | Discharge status: Home / Self Care | Payer: Medicaid Other | Attending: Emergency Medicine | Admitting: Emergency Medicine

## 2015-03-21 DIAGNOSIS — J019 Acute sinusitis, unspecified: Secondary | ICD-10-CM

## 2015-03-21 DIAGNOSIS — F419 Anxiety disorder, unspecified: Secondary | ICD-10-CM | POA: Diagnosis not present

## 2015-03-21 DIAGNOSIS — Z79899 Other long term (current) drug therapy: Secondary | ICD-10-CM | POA: Insufficient documentation

## 2015-03-21 DIAGNOSIS — F319 Bipolar disorder, unspecified: Secondary | ICD-10-CM | POA: Diagnosis not present

## 2015-03-21 DIAGNOSIS — Z7982 Long term (current) use of aspirin: Secondary | ICD-10-CM | POA: Diagnosis not present

## 2015-03-21 DIAGNOSIS — R519 Headache, unspecified: Secondary | ICD-10-CM

## 2015-03-21 DIAGNOSIS — J069 Acute upper respiratory infection, unspecified: Secondary | ICD-10-CM | POA: Diagnosis not present

## 2015-03-21 DIAGNOSIS — Z86010 Personal history of colonic polyps: Secondary | ICD-10-CM | POA: Insufficient documentation

## 2015-03-21 DIAGNOSIS — Z880 Allergy status to penicillin: Secondary | ICD-10-CM | POA: Diagnosis not present

## 2015-03-21 DIAGNOSIS — I10 Essential (primary) hypertension: Secondary | ICD-10-CM | POA: Insufficient documentation

## 2015-03-21 DIAGNOSIS — G43909 Migraine, unspecified, not intractable, without status migrainosus: Secondary | ICD-10-CM | POA: Diagnosis not present

## 2015-03-21 DIAGNOSIS — E7800 Pure hypercholesterolemia, unspecified: Secondary | ICD-10-CM | POA: Diagnosis not present

## 2015-03-21 DIAGNOSIS — R059 Cough, unspecified: Secondary | ICD-10-CM

## 2015-03-21 DIAGNOSIS — Z87891 Personal history of nicotine dependence: Secondary | ICD-10-CM | POA: Insufficient documentation

## 2015-03-21 DIAGNOSIS — K219 Gastro-esophageal reflux disease without esophagitis: Secondary | ICD-10-CM | POA: Insufficient documentation

## 2015-03-21 DIAGNOSIS — R05 Cough: Secondary | ICD-10-CM

## 2015-03-21 DIAGNOSIS — R51 Headache: Secondary | ICD-10-CM

## 2015-03-21 DIAGNOSIS — R0981 Nasal congestion: Secondary | ICD-10-CM

## 2015-03-21 MED ORDER — DIPHENHYDRAMINE HCL 50 MG/ML IJ SOLN
25.0000 mg | Freq: Once | INTRAMUSCULAR | Status: AC
  Administered 2015-03-21: 25 mg via INTRAVENOUS
  Filled 2015-03-21: qty 1

## 2015-03-21 MED ORDER — FLUTICASONE PROPIONATE 50 MCG/ACT NA SUSP: 2.0000 | Freq: Every day | NASAL | Status: DC

## 2015-03-21 MED ORDER — LORATADINE 10 MG PO TABS: 10.0000 mg | ORAL_TABLET | Freq: Every day | ORAL | Status: DC

## 2015-03-21 MED ORDER — METOCLOPRAMIDE HCL 10 MG PO TABS: 10.0000 mg | ORAL_TABLET | Freq: Four times a day (QID) | ORAL | Status: DC | PRN

## 2015-03-21 MED ORDER — KETOROLAC TROMETHAMINE 30 MG/ML IJ SOLN
30.0000 mg | Freq: Once | INTRAMUSCULAR | Status: AC
  Administered 2015-03-21: 30 mg via INTRAVENOUS
  Filled 2015-03-21: qty 1

## 2015-03-21 MED ORDER — NAPROXEN 500 MG PO TABS: 500.0000 mg | ORAL_TABLET | Freq: Two times a day (BID) | ORAL | Status: DC | PRN

## 2015-03-21 MED ORDER — METOCLOPRAMIDE HCL 5 MG/ML IJ SOLN
10.0000 mg | Freq: Once | INTRAMUSCULAR | Status: AC
  Administered 2015-03-21: 10 mg via INTRAVENOUS
  Filled 2015-03-21: qty 2

## 2015-03-21 MED ORDER — SODIUM CHLORIDE 0.9 % IV BOLUS (SEPSIS)
1000.0000 mL | Freq: Once | INTRAVENOUS | Status: AC
  Administered 2015-03-21: 1000 mL via INTRAVENOUS

## 2015-03-21 NOTE — ED Notes (Signed)
Pt ambulated to restroom without distress.  

## 2015-03-21 NOTE — ED Notes (Signed)
Pt arrives with c/o h/a x1 week. Pt states she has had a dry cough since last Sunday. Pt also states she has has rotator cuff surgery on Jan 26, 2015, pt states she saw her surgeon this week and they don't want her to take any more narcotics. Pt still has c/o severe pain.

## 2015-03-21 NOTE — ED Provider Notes (Signed)
CSN: [id number]     Arrival date & time 03/21/15  1100 History   First MD Initiated Contact with Patient 03/21/15 1124     Chief Complaint  Patient presents with  . Headache     (Consider location/radiation/quality/duration/timing/severity/associated sxs/prior Treatment) HPI Comments: Gloria Lewis is a 59 y.o. female with a PMHx of HTN, bipolar 1 disorder, OA, GERD, HLD, chronic migraines, schizoaffective disorder, anxiety, and depression, who presents to the ED with complaints of one week of gradual onset headache in 4 days of dry cough and sinus congestion. Patient describes her headache as 7/10 intermittent frontal dull pain which is nonradiating, worse with lights, and unrelieved with Alka-Seltzer, TheraFlu, and Flonase. She describes this headache is similar to her chronic migraines. Endorses associated photophobia.  She denies any fevers, chills, neck pain or stiffness, rhinorrhea, ear pain or drainage, eye itching or redness, vision changes, hearing loss, tinnitus, sore throat, chest pain, shortness of breath, abdominal pain, nausea, vomiting, diarrhea, constipation, dysuria, hematuria, numbness, tingling, weakness, or lightheadedness. She has had no known recent sick contacts.  Patient is a 59 y.o. female presenting with headaches. The history is provided by the patient. No language interpreter was used.  Headache Pain location:  Frontal Quality:  Dull Radiates to:  Does not radiate Severity currently:  7/10 Onset quality:  Gradual Duration:  1 week Timing:  Intermittent Progression:  Unchanged Chronicity:  Recurrent Similar to prior headaches: yes   Context comment:  URI Relieved by:  Nothing Worsened by:  Light Ineffective treatments: alka seltzer, theraflu, and flonase. Associated symptoms: cough, photophobia, sinus pressure and URI   Associated symptoms: no abdominal pain, no blurred vision, no diarrhea, no ear pain, no eye pain, no fever, no focal weakness, no hearing  loss, no myalgias, no nausea, no neck pain, no neck stiffness, no numbness, no paresthesias, no sore throat, no tingling, no visual change, no vomiting and no weakness     Past Medical History  Diagnosis Date  . HTN (hypertension)   . Bipolar 1 disorder ([place])   . OA (osteoarthritis)   . GERD (gastroesophageal reflux disease)   . Hypercholesterolemia   . Fever blister   . HA (headache)   . Colon polyp   . CTS (carpal tunnel syndrome)   . Schizo-affective psychosis ([place])   . Anxiety   . Depression   . Migraines    Past Surgical History  Procedure Laterality Date  . Neck surgery  2009  . Carpal tunnel release  20110 rt/lt  . Polp removed  2011  . Back injection    . 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 Anterior cervical decomp/discectomy fusion  08/27/2011    Procedure: ANTERIOR CERVICAL DECOMPRESSION/DISCECTOMY FUSION 1 LEVEL/HARDWARE REMOVAL;  Surgeon: Eustace Moore, MD;  Location: [facility];  Service: Neurosurgery;  Laterality: Bilateral;  Cervical four-five Anterior cervical decompression/diskectomy, fusion, Plate, Removal of Cervical five-seven Plate  . Hemorrhoid surgery    . Cardiac catheterization N/A 11/09/2014    Procedure: Right Heart Cath;  Surgeon: Larey Dresser, MD;  Location: [facility];  Service: Cardiovascular;  Laterality: N/A;  . Shoulder arthroscopy with subacromial decompression Right 01/24/2015    Procedure: RIGHT SHOULDER ARTHROSCOPY WITH SUBACROMIAL DECOMPRESSION AD DISTAL CLAVICLE RESECTION ;  Surgeon: Justice Britain, MD;  Location: [facility];  Service: Orthopedics;  Laterality: Right;   Family History  Problem Relation Age of Onset  . Coronary artery disease  Father   . Hypertension Mother   . Schizophrenia Mother   . Depression Brother   . Prostate cancer Brother   . Anesthesia problems Neg Hx   . Hypotension Neg Hx   . Malignant hyperthermia Neg Hx   . Pseudochol deficiency Neg Hx   . Cancer Father      head neck    Social History  Substance Use Topics  . Smoking status: Former Smoker -- 0.10 packs/day for 30 years    Types: Cigarettes  . Smokeless tobacco: Never Used     Comment: using nicotrol inhaler  . Alcohol Use: No   OB History    No data available     Review of Systems  Constitutional: Negative for fever and chills.  HENT: Positive for sinus pressure. Negative for ear discharge, ear pain, hearing loss, rhinorrhea, sore throat and tinnitus.   Eyes: Positive for photophobia. Negative for blurred vision, pain, discharge and visual disturbance.  Respiratory: Positive for cough. Negative for shortness of breath.   Cardiovascular: Negative for chest pain.  Gastrointestinal: Negative for nausea, vomiting, abdominal pain, diarrhea and constipation.  Genitourinary: Negative for dysuria and hematuria.  Musculoskeletal: Negative for myalgias, arthralgias, neck pain and neck stiffness.  Skin: Negative for color change.  Allergic/Immunologic: Negative for immunocompromised state.  Neurological: Positive for headaches. Negative for focal weakness, weakness, light-headedness, numbness and paresthesias.  Psychiatric/Behavioral: Negative for confusion.   10 Systems reviewed and are negative for acute change except as noted in the HPI.    Allergies  Amoxicillin; Chantix; Effexor; Norco; Paroxetine hcl; Penicillins; Tramadol; Zithromax; and Hydrocodone  Home Medications   Prior to Admission medications   Medication Sig Start Date End Date Taking? Authorizing Provider  amLODipine (NORVASC) 5 MG tablet Take 5 mg by mouth daily.    Historical Provider, MD  aspirin EC 81 MG EC tablet Take 1 tablet (81 mg total) by mouth daily. 06/23/14   Orson Eva, MD  atorvastatin (LIPITOR) 10 MG tablet Take 10 mg by mouth daily.    Historical Provider, MD  budesonide-formoterol (SYMBICORT) 160-4.5 MCG/ACT inhaler Inhale 2 puffs into the lungs 2 (two) times daily as needed (for shortness of breath and  wheezing).     Historical Provider, MD  cholecalciferol (VITAMIN D) 1000 UNITS tablet Take 1,000 Units by mouth daily.    Historical Provider, MD  cyclobenzaprine (FLEXERIL) 10 MG tablet Take 10 mg by mouth 3 (three) times daily.     Historical Provider, MD  cyclobenzaprine (FLEXERIL) 10 MG tablet Take 1 tablet (10 mg total) by mouth 3 (three) times daily as needed for muscle spasms. 01/24/15   Olivia Mackie Shuford, PA-C  diazepam (VALIUM) 5 MG tablet Take 1 tablet (5 mg total) by mouth 4 (four) times daily. 03/20/15   Norma Fredrickson, MD  docusate sodium (COLACE) 100 MG capsule Take 100 mg by mouth daily as needed for mild constipation.    Historical Provider, MD  fluticasone (FLONASE) 50 MCG/ACT nasal spray Place 2 sprays into both nostrils daily as needed for allergies.  06/07/14   Historical Provider, MD  folic acid (FOLVITE) 1 MG tablet Take 1 mg by mouth daily.    Historical Provider, MD  HYDROmorphone (DILAUDID) 2 MG tablet Take 1-2 tablets (2-4 mg total) by mouth every 4 (four) hours as needed for severe pain. Patient not taking: Reported on 02/06/2015 01/24/15   Jenetta Loges, PA-C  Levomilnacipran HCl ER 40 MG CP24 Take 40 mg by mouth daily. 03/20/15   Norma Fredrickson, MD  Linaclotide (LINZESS) 145 MCG CAPS capsule Take 145 mcg by mouth as needed.    Historical Provider, MD  loratadine (CLARITIN) 10 MG tablet Take 10 mg by mouth daily as needed for allergies.  06/20/14   Historical Provider, MD  Multiple Vitamins-Minerals (MULTIVITAMIN WITH MINERALS) tablet Take 1 tablet by mouth every morning.     Historical Provider, MD  nebivolol (BYSTOLIC) 10 MG tablet Take 1 tablet (10 mg total) by mouth daily. 08/14/14   Tammy S Parrett, NP  nicotine (NICOTROL) 10 MG inhaler Inhale 1 continuous puffing into the lungs daily.     Historical Provider, MD  nortriptyline (PAMELOR) 25 MG capsule 2  qhs 03/20/15   Norma Fredrickson, MD  ondansetron (ZOFRAN) 4 MG tablet Take 1 tablet (4 mg total) by mouth every 8 (eight)  hours as needed for nausea or vomiting. 01/24/15   Olivia Mackie Shuford, PA-C  pantoprazole (PROTONIX) 40 MG tablet Take 40 mg by mouth daily before breakfast.  06/25/14   Historical Provider, MD  potassium chloride SA (K-DUR,KLOR-CON) 20 MEQ tablet Take 40 mEq by mouth daily as needed (hypokalemia).     Historical Provider, MD  sucralfate (CARAFATE) 1 G tablet Take 1 tablet (1 g total) by mouth 3 (three) times daily with meals. 06/24/14   Orson Eva, MD  triamterene-hydrochlorothiazide (MAXZIDE-25) 37.5-25 MG per tablet Take 1 tablet by mouth daily.    Historical Provider, MD  valACYclovir (VALTREX) 1000 MG tablet Take 1,000 mg by mouth daily as needed (for out breaks).     Historical Provider, MD  vitamin B-12 (CYANOCOBALAMIN) 1000 MCG tablet Take 1,000 mcg by mouth daily.    Historical Provider, MD  zolpidem (AMBIEN CR) 12.5 MG CR tablet Take 1 tablet (12.5 mg total) by mouth at bedtime as needed for sleep. 03/20/15   Norma Fredrickson, MD   BP 108/76 mmHg  Pulse 71  Temp(Src) 98.7 F (37.1 C) (Oral)  Ht 5\' 7"  (1.702 m)  Wt 102.967 kg  BMI 35.55 kg/m2  SpO2 98% Physical Exam  Constitutional: She is oriented to person, place, and time. Vital signs are normal. She appears well-developed and well-nourished.  Non-toxic appearance. No distress.  Afebrile, nontoxic, NAD  HENT:  Head: Normocephalic and atraumatic.  Nose: Mucosal edema and rhinorrhea present. Right sinus exhibits maxillary sinus tenderness and frontal sinus tenderness. Left sinus exhibits maxillary sinus tenderness and frontal sinus tenderness.  Mouth/Throat: Uvula is midline, oropharynx is clear and moist and mucous membranes are normal. No trismus in the jaw. No uvula swelling.  Nasal mucosa edematous with mucoid rhinorrhea, diffuse b/l sinus TTP. Oropharynx clear and moist, without uvular swelling or deviation, no trismus or drooling, no tonsillar swelling or erythema, no exudates.    Eyes: Conjunctivae and EOM are normal. Pupils are  equal, round, and reactive to light. Right eye exhibits no discharge. Left eye exhibits no discharge.  PERRL, EOMI, no nystagmus, no visual field deficits   Neck: Normal range of motion. Neck supple. No spinous process tenderness and no muscular tenderness present. No rigidity. Normal range of motion present.  FROM intact without spinous process TTP, no bony stepoffs or deformities, no paraspinous muscle TTP or muscle spasms. No rigidity or meningeal signs. No bruising or swelling.   Cardiovascular: Normal rate, regular rhythm, normal heart sounds and intact distal pulses.  Exam reveals no gallop and no friction rub.   No murmur heard. Pulmonary/Chest: Effort normal and breath sounds normal. No respiratory distress. She has no decreased breath sounds. She has no  wheezes. She has no rhonchi. She has no rales.  CTAB in all lung fields, no w/r/r, no hypoxia or increased WOB, speaking in full sentences, SpO2 98% on RA   Abdominal: Soft. Normal appearance and bowel sounds are normal. She exhibits no distension. There is no tenderness. There is no rigidity, no rebound, no guarding and no CVA tenderness.  Musculoskeletal: Normal range of motion.  MAE x4 Strength and sensation grossly intact Distal pulses intact Gait steady  Neurological: She is alert and oriented to person, place, and time. She has normal strength. No cranial nerve deficit or sensory deficit. Coordination and gait normal. GCS eye subscore is 4. GCS verbal subscore is 5. GCS motor subscore is 6.  CN 2-12 grossly intact A&O x4 GCS 15 Sensation and strength intact Gait nonataxic Coordination with finger-to-nose WNL Neg pronator drift   Skin: Skin is warm, dry and intact. No rash noted.  Psychiatric: She has a normal mood and affect.  Nursing note and vitals reviewed.   ED Course  Procedures (including critical care time) Labs Review Labs Reviewed - No data to display  Imaging Review No results found. I have personally  reviewed and evaluated these images and lab results as part of my medical decision-making.   EKG Interpretation None      MDM   Final diagnoses:  Acute nonintractable headache, unspecified headache type  Sinus congestion  Sinus headache  URI (upper respiratory infection)  Cough  Acute sinusitis, recurrence not specified, unspecified location    59 y.o. female here with 1wk of HA, cough, sinus congestion. Headache similar to prior headaches, no focal neuro deficits, no red flag s/sx, doubt need for imaging. On exam, nasal turbinate edema and mucoid rhinorrhea, some sinus tenderness, likely sinusitis causing sinus headache. Clear lung exam, doubt need for CXR. Will give headache cocktail and reassess shortly.   3:05 PM Pt feeling improved. Tolerating PO well. Discussed that this is likely sinusitis/URI, most likely viral or allergic. Doubt need for abx at this time. Will refill flonase, and start on claritin. Will give rx for naprosyn and reglan since these helped today. Discussed symptomatic control of URI symptoms. F/up with PCP in 1wk. I explained the diagnosis and have given explicit precautions to return to the ER including for any other new or worsening symptoms. The patient understands and accepts the medical plan as it's been dictated and I have answered their questions. Discharge instructions concerning home care and prescriptions have been given. The patient is STABLE and is discharged to home in good condition.  BP 112/69 mmHg  Pulse 64  Temp(Src) 98.7 F (37.1 C) (Oral)  Resp 18  Ht 5\' 7"  (1.702 m)  Wt 102.967 kg  BMI 35.55 kg/m2  SpO2 98%  Meds ordered this encounter  Medications  . metoCLOPramide (REGLAN) injection 10 mg    Sig:    And  . diphenhydrAMINE (BENADRYL) injection 25 mg    Sig:    And  . sodium chloride 0.9 % bolus 1,000 mL    Sig:    And  . ketorolac (TORADOL) 30 MG/ML injection 30 mg    Sig:   . naproxen (NAPROSYN) 500 MG tablet    Sig: Take 1  tablet (500 mg total) by mouth 2 (two) times daily as needed for mild pain, moderate pain or headache (TAKE WITH MEALS.).    Dispense:  20 tablet    Refill:  0    Order Specific Question:  Supervising Provider  Answer:  MILLER, Conrath  . metoCLOPramide (REGLAN) 10 MG tablet    Sig: Take 1 tablet (10 mg total) by mouth every 6 (six) hours as needed for nausea (nausea/headache).    Dispense:  6 tablet    Refill:  0    Order Specific Question:  Supervising Provider    Answer:  MILLER, BRIAN [[id number]]  . fluticasone (FLONASE) 50 MCG/ACT nasal spray    Sig: Place 2 sprays into both nostrils daily.    Dispense:  16 g    Refill:  0    Order Specific Question:  Supervising Provider    Answer:  MILLER, BRIAN [[id number]]  . loratadine (CLARITIN) 10 MG tablet    Sig: Take 1 tablet (10 mg total) by mouth daily.    Dispense:  30 tablet    Refill:  0    Order Specific Question:  Supervising Provider    Answer:  Noemi Chapel [[id number]]      Mckinsey Keagle Camprubi-Soms, PA-C 03/21/15 1507  Tanna Furry, MD 03/24/15 0800

## 2015-03-21 NOTE — ED Notes (Signed)
PT ambulated with baseline gait; VSS; A&Ox3; no signs of distress; respirations even and unlabored; skin warm and dry; no questions upon discharge.  

## 2015-03-21 NOTE — Discharge Instructions (Signed)
Your headache is likely from a sinus infection, which is likely a virus. Your cough and congestion are also likely viral, or could be from allergies. Use Naprosyn or Tylenol as needed for pain. Use reglan as needed for headaches or nausea. Stay well hydrated. Get plenty of rest. Use Mucinex for cough suppression/expectoration of mucus. Use netipot and flonase to help with nasal congestion. May consider over-the-counter Benadryl or other antihistamine (claritin, allegra, zyrtec, etc) to decrease secretions and for watery itchy eyes. Followup with your primary care doctor in 5-7 days for recheck of ongoing symptoms and for ongoing management of your headaches. Return to emergency department for emergent changing or worsening of symptoms.   Viral Infections A virus is a type of germ. Viruses can cause:  Minor sore throats.  Aches and pains.  Headaches.  Runny nose.  Rashes.  Watery eyes.  Tiredness.  Coughs.  Loss of appetite.  Feeling sick to your stomach (nausea).  Throwing up (vomiting).  Watery poop (diarrhea). HOME CARE   Only take medicines as told by your doctor.  Drink enough water and fluids to keep your pee (urine) clear or pale yellow. Sports drinks are a good choice.  Get plenty of rest and eat healthy. Soups and broths with crackers or rice are fine. GET HELP RIGHT AWAY IF:   You have a very bad headache.  You have shortness of breath.  You have chest pain or neck pain.  You have an unusual rash.  You cannot stop throwing up.  You have watery poop that does not stop.  You cannot keep fluids down.  You or your child has a temperature by mouth above 102 F (38.9 C), not controlled by medicine.  Your baby is older than 3 months with a rectal temperature of 102 F (38.9 C) or higher.  Your baby is 34 months old or younger with a rectal temperature of 100.4 F (38 C) or higher. MAKE SURE YOU:   Understand these instructions.  Will watch this  condition.  Will get help right away if you are not doing well or get worse.   This information is not intended to replace advice given to you by your health care provider. Make sure you discuss any questions you have with your health care provider.   Document Released: 03/05/2008 Document Revised: 06/15/2011 Document Reviewed: 08/29/2014 Elsevier Interactive Patient Education 2016 Elsevier Inc.  Sinus Headache A sinus headache happens when your sinuses become clogged or swollen. You may feel pain or pressure in your face, forehead, ears, or upper teeth. Sinus headaches can be mild or severe. HOME CARE  Take medicines only as told by your doctor.  If you were given an antibiotic medicine, finish all of it even if you start to feel better.  Use a nose spray if you feel stuffed up (congested).  If told, apply a warm, moist washcloth to your face to help lessen pain. GET HELP IF:  You get headaches more than one time each week.  Light or sound bothers you.  You have a fever.  You feel sick to your stomach (nauseous) or you throw up (vomit).  Your headaches do not get better with treatment. GET HELP RIGHT AWAY IF:  You have trouble seeing.  You suddenly have very bad pain in your face or head.  You start to twitch or shake (seizure).  You are confused.  You have a stiff neck.   This information is not intended to replace advice given to  you by your health care provider. Make sure you discuss any questions you have with your health care provider.   Document Released: 07/23/2010 Document Revised: 08/07/2014 Document Reviewed: 03/19/2014 Elsevier Interactive Patient Education 2016 [employer].  Sinusitis, Adult Sinusitis is redness, soreness, and puffiness (inflammation) of the air pockets in the bones of your face (sinuses). The redness, soreness, and puffiness can cause air and mucus to get trapped in your sinuses. This can allow germs to grow and cause an infection.    HOME CARE   Drink enough fluids to keep your pee (urine) clear or pale yellow.  Use a humidifier in your home.  Run a hot shower to create steam in the bathroom. Sit in the bathroom with the door closed. Breathe in the steam 3-4 times a day.  Put a warm, moist washcloth on your face 3-4 times a day, or as told by your doctor.  Use salt water sprays (saline sprays) to wet the thick fluid in your nose. This can help the sinuses drain.  Only take medicine as told by your doctor. GET HELP RIGHT AWAY IF:   Your pain gets worse.  You have very bad headaches.  You are sick to your stomach (nauseous).  You throw up (vomit).  You are very sleepy (drowsy) all the time.  Your face is puffy (swollen).  Your vision changes.  You have a stiff neck.  You have trouble breathing. MAKE SURE YOU:   Understand these instructions.  Will watch your condition.  Will get help right away if you are not doing well or get worse.   This information is not intended to replace advice given to you by your health care provider. Make sure you discuss any questions you have with your health care provider.   Document Released: 09/09/2007 Document Revised: 04/13/2014 Document Reviewed: 10/27/2011 Elsevier Interactive Patient Education 2016 Elsevier Inc.  Sinus Rinse WHAT IS A SINUS RINSE? A sinus rinse is a home treatment. It rinses your sinuses with a mixture of salt and water (saline solution). Sinuses are air-filled spaces in your skull behind the bones of your face and forehead. They open into your nasal cavity. To do a sinus rinse, you will need:  Saline solution.  Neti pot or spray bottle. This releases the saline solution into your nose and through your sinuses. You can buy neti pots and spray bottles at:  Your local pharmacy.  A health food store.  Online. WHEN WOULD I DO A SINUS RINSE?  A sinus rinse can help to clear your nasal cavity. It can clear:    Mucus.  Dirt.  Dust.  Pollen. You may do a sinus rinse when you have:  A cold.  A virus.  Allergies.  A sinus infection.  A stuffy nose. If you are considering a sinus rinse:  Ask your child's doctor before doing a sinus rinse on your child.  Do not do a sinus rinse if you have had:  Ear or nasal surgery.  An ear infection.  Blocked ears. HOW DO I DO A SINUS RINSE?   Wash your hands.  Disinfect your device using the directions that came with the device.  Dry your device.  Use the solution that comes with your device or one that is sold separately in stores. Follow the mixing directions on the package.  Fill your device with the amount of saline solution as stated in the device instructions.  Stand over a sink and tilt your head sideways over the  sink.  Place the spout of the device in your upper nostril (the one closer to the ceiling).  Gently pour or squeeze the saline solution into the nasal cavity. The liquid should drain to the lower nostril if you are not too congested.  Gently blow your nose. Blowing too hard may cause ear pain.  Repeat in the other nostril.  Clean and rinse your device with clean water.  Air-dry your device. ARE THERE RISKS OF A SINUS RINSE?  Sinus rinse is normally very safe and helpful. However, there are a few risks, which include:   A burning feeling in the sinuses. This may happen if you do not make the saline solution as instructed. Make sure to follow all directions when making the saline solution.  Infection from unclean water. This is rare, but possible.  Nasal irritation.   This information is not intended to replace advice given to you by your health care provider. Make sure you discuss any questions you have with your health care provider.   Document Released: 10/18/2013 Document Reviewed: 10/18/2013 Elsevier Interactive Patient Education [employer].

## 2015-04-04 ENCOUNTER — Telehealth (HOSPITAL_COMMUNITY): Payer: Self-pay

## 2015-04-04 NOTE — Telephone Encounter (Signed)
Telephone call with patient after she left a message stating she was in need of a letter from Dr. Casimiro Needle to take to the [employer] to apply for disability.  Patient reported Dr. Casimiro Needle told her at 03/20/15 evaluation he felt she was "totally disabled" and would write whatever is needed to assist patient with applying for disability.  Informed patient Dr. Casimiro Needle would be back in our office on 04/05/15 so would send request for a letter for her to take to the [place] in support of her being found disabled and will contact patient back when letter prepared and if Dr. Casimiro Needle approves.

## 2015-04-10 NOTE — Telephone Encounter (Signed)
Telephone message left for patient this nurse spoke with Dr. Casimiro Needle about her request for a letter stating she was "totally disabled".  Informed Dr. Casimiro Needle remembered them discussing this but did not remember being in total agreement or agreeing to do a letter for the [employer].  Informed patient per Dr. Casimiro Needle she could call back to reschedule an earlier appointment if she wanted to come back to discuss further and requested she call back if this is what she wanted to do.

## 2015-04-27 ENCOUNTER — Encounter (HOSPITAL_COMMUNITY): Payer: Self-pay | Admitting: Family Medicine

## 2015-04-27 ENCOUNTER — Emergency Department (HOSPITAL_COMMUNITY)
Admission: EM | Admit: 2015-04-27 | Discharge: 2015-04-27 | Disposition: A | Discharge status: Home / Self Care | Payer: Medicaid Other | Attending: Emergency Medicine | Admitting: Emergency Medicine

## 2015-04-27 ENCOUNTER — Emergency Department (EMERGENCY_DEPARTMENT_HOSPITAL)
Admit: 2015-04-27 | Discharge: 2015-04-27 | Disposition: A | Discharge status: Home / Self Care | Payer: Medicaid Other | Attending: Emergency Medicine | Admitting: Emergency Medicine

## 2015-04-27 ENCOUNTER — Emergency Department (HOSPITAL_COMMUNITY): Payer: Medicaid Other

## 2015-04-27 DIAGNOSIS — M25511 Pain in right shoulder: Secondary | ICD-10-CM | POA: Diagnosis not present

## 2015-04-27 DIAGNOSIS — E669 Obesity, unspecified: Secondary | ICD-10-CM | POA: Diagnosis not present

## 2015-04-27 DIAGNOSIS — K219 Gastro-esophageal reflux disease without esophagitis: Secondary | ICD-10-CM | POA: Insufficient documentation

## 2015-04-27 DIAGNOSIS — I10 Essential (primary) hypertension: Secondary | ICD-10-CM | POA: Insufficient documentation

## 2015-04-27 DIAGNOSIS — Z87891 Personal history of nicotine dependence: Secondary | ICD-10-CM | POA: Diagnosis not present

## 2015-04-27 DIAGNOSIS — M79609 Pain in unspecified limb: Secondary | ICD-10-CM

## 2015-04-27 DIAGNOSIS — Z79899 Other long term (current) drug therapy: Secondary | ICD-10-CM | POA: Insufficient documentation

## 2015-04-27 DIAGNOSIS — Z7951 Long term (current) use of inhaled steroids: Secondary | ICD-10-CM | POA: Diagnosis not present

## 2015-04-27 DIAGNOSIS — E7800 Pure hypercholesterolemia, unspecified: Secondary | ICD-10-CM | POA: Diagnosis not present

## 2015-04-27 DIAGNOSIS — Z7982 Long term (current) use of aspirin: Secondary | ICD-10-CM | POA: Diagnosis not present

## 2015-04-27 DIAGNOSIS — F419 Anxiety disorder, unspecified: Secondary | ICD-10-CM | POA: Diagnosis not present

## 2015-04-27 DIAGNOSIS — Z880 Allergy status to penicillin: Secondary | ICD-10-CM | POA: Insufficient documentation

## 2015-04-27 DIAGNOSIS — Z9889 Other specified postprocedural states: Secondary | ICD-10-CM | POA: Diagnosis not present

## 2015-04-27 DIAGNOSIS — Z8619 Personal history of other infectious and parasitic diseases: Secondary | ICD-10-CM | POA: Diagnosis not present

## 2015-04-27 DIAGNOSIS — F319 Bipolar disorder, unspecified: Secondary | ICD-10-CM | POA: Diagnosis not present

## 2015-04-27 DIAGNOSIS — G43909 Migraine, unspecified, not intractable, without status migrainosus: Secondary | ICD-10-CM | POA: Insufficient documentation

## 2015-04-27 DIAGNOSIS — Z86010 Personal history of colonic polyps: Secondary | ICD-10-CM | POA: Insufficient documentation

## 2015-04-27 DIAGNOSIS — M1990 Unspecified osteoarthritis, unspecified site: Secondary | ICD-10-CM | POA: Diagnosis not present

## 2015-04-27 DIAGNOSIS — Z8669 Personal history of other diseases of the nervous system and sense organs: Secondary | ICD-10-CM | POA: Insufficient documentation

## 2015-04-27 LAB — CBC WITH DIFFERENTIAL/PLATELET
BASOS ABS: 0 10*3/uL (ref 0.0–0.1)
BASOS PCT: 0 %
EOS ABS: 0.3 10*3/uL (ref 0.0–0.7)
Eosinophils Relative: 5 %
HEMATOCRIT: 41.1 % (ref 36.0–46.0)
HEMOGLOBIN: 13.7 g/dL (ref 12.0–15.0)
Lymphocytes Relative: 47 %
Lymphs Abs: 3.3 10*3/uL (ref 0.7–4.0)
MCH: 29.7 pg (ref 26.0–34.0)
MCHC: 33.3 g/dL (ref 30.0–36.0)
MCV: 89.2 fL (ref 78.0–100.0)
Monocytes Absolute: 0.5 10*3/uL (ref 0.1–1.0)
Monocytes Relative: 7 %
NEUTROS ABS: 2.8 10*3/uL (ref 1.7–7.7)
NEUTROS PCT: 41 %
Platelets: 352 10*3/uL (ref 150–400)
RBC: 4.61 MIL/uL (ref 3.87–5.11)
RDW: 14.2 % (ref 11.5–15.5)
WBC: 7 10*3/uL (ref 4.0–10.5)

## 2015-04-27 LAB — BASIC METABOLIC PANEL
ANION GAP: 12 (ref 5–15)
BUN: <5 mg/dL — ABNORMAL LOW (ref 6–20)
CALCIUM: 9.4 mg/dL (ref 8.9–10.3)
CO2: 27 mmol/L (ref 22–32)
CREATININE: 0.91 mg/dL (ref 0.44–1.00)
Chloride: 100 mmol/L — ABNORMAL LOW (ref 101–111)
Glucose, Bld: 104 mg/dL — ABNORMAL HIGH (ref 65–99)
Potassium: 4 mmol/L (ref 3.5–5.1)
SODIUM: 139 mmol/L (ref 135–145)

## 2015-04-27 MED ORDER — SODIUM CHLORIDE 0.9 % IV BOLUS (SEPSIS)
1000.0000 mL | Freq: Once | INTRAVENOUS | Status: AC
  Administered 2015-04-27: 1000 mL via INTRAVENOUS

## 2015-04-27 MED ORDER — FENTANYL CITRATE (PF) 100 MCG/2ML IJ SOLN
100.0000 ug | Freq: Once | INTRAMUSCULAR | Status: AC
  Administered 2015-04-27: 100 ug via INTRAVENOUS
  Filled 2015-04-27: qty 2

## 2015-04-27 NOTE — ED Notes (Signed)
Pt comfortable with discharge and follow up instructions. No prescriptions. 

## 2015-04-27 NOTE — Progress Notes (Signed)
VASCULAR LAB PRELIMINARY  PRELIMINARY  PRELIMINARY  PRELIMINARY  Right upper extremity venous duplex completed.    Preliminary report:  There is no DVT or SVT noted in the right upper extremity.   Quanesha Klimaszewski, RVT 04/27/2015, 11:11 AM

## 2015-04-27 NOTE — ED Notes (Signed)
Coffee provided

## 2015-04-27 NOTE — ED Provider Notes (Signed)
CSN: [id number]     Arrival date & time 04/27/15  [id number] History   First MD Initiated Contact with Patient 04/27/15 0940     Chief Complaint  Patient presents with  . Shoulder Pain     (Consider location/radiation/quality/duration/timing/severity/associated sxs/prior Treatment) HPI  60 year old female presents with right shoulder pain. Had shoulder surgery in October 2016 by Dr. supple. Patient states that a couple weeks after the surgery her arm has been hurting and has been progressively worse. She is scheduled for an MRI on 05/07/15. However over the last 24 hours her pain is even worse than typical. She also feels like her upper arm is swollen down to the elbow. No elbow pain. Pain is worse with range of motion. No weakness or numbness. No chest pain or shortness of breath. Has been taking oxycodone 5 mg from Dr. supple but states that this is too low a dose and is not helping. Currently her pain is severe.  On vital signs her initial blood pressures are in the 90s. She states her blood pressures typically high and she is on multiple different medicines for this. Last took these last night. Denies any lightheadedness or feeling different besides the pain. No fevers or infectious symptoms.  Past Medical History  Diagnosis Date  . HTN (hypertension)   . Bipolar 1 disorder ([place])   . OA (osteoarthritis)   . GERD (gastroesophageal reflux disease)   . Hypercholesterolemia   . Fever blister   . HA (headache)   . Colon polyp   . CTS (carpal tunnel syndrome)   . Schizo-affective psychosis ([place])   . Anxiety   . Depression   . Migraines    Past Surgical History  Procedure Laterality Date  . Neck surgery  2009  . Carpal tunnel release  20110 rt/lt  . Polp removed  2011  . Back injection    . 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 Anterior cervical decomp/discectomy fusion  08/27/2011    Procedure: ANTERIOR CERVICAL DECOMPRESSION/DISCECTOMY FUSION 1  LEVEL/HARDWARE REMOVAL;  Surgeon: Eustace Moore, MD;  Location: [facility];  Service: Neurosurgery;  Laterality: Bilateral;  Cervical four-five Anterior cervical decompression/diskectomy, fusion, Plate, Removal of Cervical five-seven Plate  . Hemorrhoid surgery    . Cardiac catheterization N/A 11/09/2014    Procedure: Right Heart Cath;  Surgeon: Larey Dresser, MD;  Location: [facility];  Service: Cardiovascular;  Laterality: N/A;  . Shoulder arthroscopy with subacromial decompression Right 01/24/2015    Procedure: RIGHT SHOULDER ARTHROSCOPY WITH SUBACROMIAL DECOMPRESSION AD DISTAL CLAVICLE RESECTION ;  Surgeon: Justice Britain, MD;  Location: [facility];  Service: Orthopedics;  Laterality: Right;   Family History  Problem Relation Age of Onset  . Coronary artery disease Father   . Hypertension Mother   . Schizophrenia Mother   . Depression Brother   . Prostate cancer Brother   . Anesthesia problems Neg Hx   . Hypotension Neg Hx   . Malignant hyperthermia Neg Hx   . Pseudochol deficiency Neg Hx   . Cancer Father     head neck    Social History  Substance Use Topics  . Smoking status: Former Smoker -- 0.10 packs/day for 30 years    Types: Cigarettes  . Smokeless tobacco: Never Used     Comment: using nicotrol inhaler  . Alcohol Use: No   OB History    No data available     Review  of Systems  Constitutional: Negative for fever.  Respiratory: Negative for cough and shortness of breath.   Cardiovascular: Negative for chest pain.  Genitourinary: Negative for dysuria.  Musculoskeletal: Positive for arthralgias.  Neurological: Negative for weakness and numbness.  All other systems reviewed and are negative.     Allergies  Amoxicillin; Chantix; Effexor; Norco; Paroxetine hcl; Penicillins; Tramadol; Zithromax; and Hydrocodone  Home Medications   Prior to Admission medications   Medication Sig Start Date End Date Taking? Authorizing Provider  amLODipine (NORVASC) 5 MG  tablet Take 5 mg by mouth daily.    Historical Provider, MD  aspirin EC 81 MG EC tablet Take 1 tablet (81 mg total) by mouth daily. 06/23/14   Orson Eva, MD  atorvastatin (LIPITOR) 10 MG tablet Take 10 mg by mouth daily.    Historical Provider, MD  budesonide-formoterol (SYMBICORT) 160-4.5 MCG/ACT inhaler Inhale 2 puffs into the lungs 2 (two) times daily as needed (for shortness of breath and wheezing).     Historical Provider, MD  cholecalciferol (VITAMIN D) 1000 UNITS tablet Take 1,000 Units by mouth daily.    Historical Provider, MD  cyclobenzaprine (FLEXERIL) 10 MG tablet Take 1 tablet (10 mg total) by mouth 3 (three) times daily as needed for muscle spasms. 01/24/15   Olivia Mackie Shuford, PA-C  diazepam (VALIUM) 5 MG tablet Take 1 tablet (5 mg total) by mouth 4 (four) times daily. Patient taking differently: Take 10 mg by mouth 2 (two) times daily.  03/20/15   Norma Fredrickson, MD  docusate sodium (COLACE) 100 MG capsule Take 100 mg by mouth daily as needed for mild constipation.    Historical Provider, MD  fluticasone (FLONASE) 50 MCG/ACT nasal spray Place 2 sprays into both nostrils daily as needed for allergies.  06/07/14   Historical Provider, MD  fluticasone (FLONASE) 50 MCG/ACT nasal spray Place 2 sprays into both nostrils daily. 03/21/15   Mercedes Camprubi-Soms, PA-C  folic acid (FOLVITE) 1 MG tablet Take 1 mg by mouth daily.    Historical Provider, MD  HYDROmorphone (DILAUDID) 2 MG tablet Take 1-2 tablets (2-4 mg total) by mouth every 4 (four) hours as needed for severe pain. Patient not taking: Reported on 02/06/2015 01/24/15   Jenetta Loges, PA-C  Levomilnacipran HCl ER 40 MG CP24 Take 40 mg by mouth daily. 03/20/15   Norma Fredrickson, MD  Linaclotide [facility]) 145 MCG CAPS capsule Take 145 mcg by mouth daily as needed (constipation).     Historical Provider, MD  loratadine (CLARITIN) 10 MG tablet Take 1 tablet (10 mg total) by mouth daily. 03/21/15   Mercedes Camprubi-Soms, PA-C  metoCLOPramide  (REGLAN) 10 MG tablet Take 1 tablet (10 mg total) by mouth every 6 (six) hours as needed for nausea (nausea/headache). 03/21/15   Mercedes Camprubi-Soms, PA-C  Multiple Vitamins-Minerals (MULTIVITAMIN WITH MINERALS) tablet Take 1 tablet by mouth every morning.     Historical Provider, MD  naproxen (NAPROSYN) 500 MG tablet Take 1 tablet (500 mg total) by mouth 2 (two) times daily as needed for mild pain, moderate pain or headache (TAKE WITH MEALS.). 03/21/15   Mercedes Camprubi-Soms, PA-C  nebivolol (BYSTOLIC) 10 MG tablet Take 1 tablet (10 mg total) by mouth daily. 08/14/14   Tammy S Parrett, NP  nicotine (NICOTROL) 10 MG inhaler Inhale 1 continuous puffing into the lungs daily as needed for smoking cessation.     Historical Provider, MD  nortriptyline (PAMELOR) 25 MG capsule 2  qhs Patient taking differently: Take 50 mg by mouth at bedtime.  2  qhs 03/20/15   Norma Fredrickson, MD  ondansetron (ZOFRAN) 4 MG tablet Take 1 tablet (4 mg total) by mouth every 8 (eight) hours as needed for nausea or vomiting. 01/24/15   Olivia Mackie Shuford, PA-C  pantoprazole (PROTONIX) 40 MG tablet Take 40 mg by mouth daily before breakfast.  06/25/14   Historical Provider, MD  potassium chloride SA (K-DUR,KLOR-CON) 20 MEQ tablet Take 40 mEq by mouth daily.     Historical Provider, MD  sucralfate (CARAFATE) 1 G tablet Take 1 tablet (1 g total) by mouth 3 (three) times daily with meals. 06/24/14   Orson Eva, MD  triamterene-hydrochlorothiazide (MAXZIDE-25) 37.5-25 MG per tablet Take 1 tablet by mouth daily.    Historical Provider, MD  valACYclovir (VALTREX) 1000 MG tablet Take 1,000 mg by mouth daily as needed (for out breaks).     Historical Provider, MD  vitamin B-12 (CYANOCOBALAMIN) 1000 MCG tablet Take 1,000 mcg by mouth daily.    Historical Provider, MD  zolpidem (AMBIEN CR) 12.5 MG CR tablet Take 1 tablet (12.5 mg total) by mouth at bedtime as needed for sleep. 03/20/15   Norma Fredrickson, MD   Pulse 81  Temp(Src) 98.3 F (36.8  C)  Resp 16  SpO2 92% Physical Exam  Constitutional: She is oriented to person, place, and time. She appears well-developed and well-nourished. No distress.  obese  HENT:  Head: Normocephalic and atraumatic.  Right Ear: External ear normal.  Left Ear: External ear normal.  Nose: Nose normal.  Eyes: Right eye exhibits no discharge. Left eye exhibits no discharge.  Cardiovascular: Normal rate, regular rhythm and normal heart sounds.   Pulses:      Radial pulses are 2+ on the right side, and 2+ on the left side.  Pulmonary/Chest: Effort normal and breath sounds normal.  Abdominal: Soft. She exhibits no distension. There is no tenderness.  Musculoskeletal:       Right shoulder: She exhibits decreased range of motion and tenderness. She exhibits no deformity.  No appreciable swelling to RUE. No redness. Tenderness at shoulder joint  Neurological: She is alert and oriented to person, place, and time.  Normal strength in bilateral upper extremities  Skin: Skin is warm and dry. She is not diaphoretic.  Nursing note and vitals reviewed.   ED Course  Procedures (including critical care time) Labs Review Labs Reviewed  BASIC METABOLIC PANEL - Abnormal; Notable for the following:    Chloride 100 (*)    Glucose, Bld 104 (*)    BUN <5 (*)    All other components within normal limits  CBC WITH DIFFERENTIAL/PLATELET    Imaging Review Dg Shoulder Right  04/27/2015  CLINICAL DATA:  Right shoulder pain and swelling since surgery in October of 2016. Had a rotator cuff repair in October. Limited range of motion. EXAM: RIGHT SHOULDER - 2+ VIEW COMPARISON:  None. FINDINGS: Alignment of the humeral head is normal relative to the glenoid fossa. 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 No acute-appearing fracture line or displaced fracture fragment. No significant degenerative change seen at the glenohumeral or  acromioclavicular joint spaces. Soft tissues about the right shoulder are unremarkable. IMPRESSION: No acute findings. No significant degenerative change. Humeral head is normally positioned. Electronically Signed   By: Franki Cabot M.D.   On: 04/27/2015 10:47    VASCULAR LAB PRELIMINARY PRELIMINARY PRELIMINARY PRELIMINARY  Right upper extremity venous duplex completed.   Preliminary report: There  is no DVT or SVT noted in the right upper extremity.   KANADY, CANDACE, RVT 04/27/2015, 11:11 AM   I have personally reviewed and evaluated these images and lab results as part of my medical decision-making.   EKG Interpretation None      MDM   Final diagnoses:  Right shoulder pain    Patient with acute on chronic right shoulder pain since surgery several months ago. No signs of swelling, given her subjective swelling and ultrasound was obtained to rule out DVT. This is negative. Patient noted to have borderline low blood pressures in the high 90s, low 100s. Could be due to cuff placement/size but at this point she is asymptomatic. No signs of a systemic infection or illness. Given she is not symptomatically and her blood pressure is currently over [id number] systolic, will discharge with return precautions, especially if she becomes dizzy or lightheaded. Recommend following up with her orthopedic surgeon for further evaluation of her shoulder.    Sherwood Gambler, MD 04/27/15 1302

## 2015-04-27 NOTE — Discharge Instructions (Signed)
Your blood pressure has been on the low side while in the ER. If you develop dizziness, weakness, you pass out or other symptoms, return to ER immediately. Otherwise follow up closely with your regular doctor and orthopedic specialist

## 2015-04-27 NOTE — ED Notes (Addendum)
Pt presents from home with c/o Right shoulder pain with intermittent edema s/p Laproscopic Rotator Cuff Repair in October.  She reports is scheduled for an MRI on 05/07/2015 but cannot wait due to the pain. Limited AROM and PROM on Exam. Pt A&Ox4 and in NAD.

## 2015-04-28 ENCOUNTER — Emergency Department (HOSPITAL_COMMUNITY)
Admission: EM | Admit: 2015-04-28 | Discharge: 2015-04-28 | Disposition: A | Discharge status: Home / Self Care | Payer: Medicaid Other | Attending: Emergency Medicine | Admitting: Emergency Medicine

## 2015-04-28 ENCOUNTER — Encounter (HOSPITAL_COMMUNITY): Payer: Self-pay | Admitting: Emergency Medicine

## 2015-04-28 DIAGNOSIS — Z79899 Other long term (current) drug therapy: Secondary | ICD-10-CM | POA: Diagnosis not present

## 2015-04-28 DIAGNOSIS — K219 Gastro-esophageal reflux disease without esophagitis: Secondary | ICD-10-CM | POA: Diagnosis not present

## 2015-04-28 DIAGNOSIS — E7800 Pure hypercholesterolemia, unspecified: Secondary | ICD-10-CM | POA: Insufficient documentation

## 2015-04-28 DIAGNOSIS — I10 Essential (primary) hypertension: Secondary | ICD-10-CM | POA: Insufficient documentation

## 2015-04-28 DIAGNOSIS — G8929 Other chronic pain: Secondary | ICD-10-CM | POA: Diagnosis not present

## 2015-04-28 DIAGNOSIS — Z8619 Personal history of other infectious and parasitic diseases: Secondary | ICD-10-CM | POA: Diagnosis not present

## 2015-04-28 DIAGNOSIS — M25511 Pain in right shoulder: Secondary | ICD-10-CM

## 2015-04-28 DIAGNOSIS — G43909 Migraine, unspecified, not intractable, without status migrainosus: Secondary | ICD-10-CM | POA: Insufficient documentation

## 2015-04-28 DIAGNOSIS — M1990 Unspecified osteoarthritis, unspecified site: Secondary | ICD-10-CM | POA: Insufficient documentation

## 2015-04-28 DIAGNOSIS — Z86010 Personal history of colonic polyps: Secondary | ICD-10-CM | POA: Insufficient documentation

## 2015-04-28 DIAGNOSIS — F329 Major depressive disorder, single episode, unspecified: Secondary | ICD-10-CM | POA: Insufficient documentation

## 2015-04-28 DIAGNOSIS — Z7982 Long term (current) use of aspirin: Secondary | ICD-10-CM | POA: Insufficient documentation

## 2015-04-28 DIAGNOSIS — F419 Anxiety disorder, unspecified: Secondary | ICD-10-CM | POA: Insufficient documentation

## 2015-04-28 DIAGNOSIS — Z880 Allergy status to penicillin: Secondary | ICD-10-CM | POA: Diagnosis not present

## 2015-04-28 DIAGNOSIS — Z7951 Long term (current) use of inhaled steroids: Secondary | ICD-10-CM | POA: Insufficient documentation

## 2015-04-28 DIAGNOSIS — Z9889 Other specified postprocedural states: Secondary | ICD-10-CM | POA: Insufficient documentation

## 2015-04-28 DIAGNOSIS — Z87891 Personal history of nicotine dependence: Secondary | ICD-10-CM | POA: Insufficient documentation

## 2015-04-28 MED ORDER — OXYCODONE-ACETAMINOPHEN 5-325 MG PO TABS: 2.0000 | ORAL_TABLET | ORAL | Status: DC | PRN

## 2015-04-28 MED ORDER — FENTANYL CITRATE (PF) 100 MCG/2ML IJ SOLN
50.0000 ug | Freq: Once | INTRAMUSCULAR | Status: AC
  Administered 2015-04-28: 50 ug via INTRAMUSCULAR
  Filled 2015-04-28: qty 2

## 2015-04-28 NOTE — ED Notes (Signed)
C/o R shoulder pain and low BP.  Pt reports history of R shoulder surgery with chronic pain.  States she was seen in ED yesterday and was hypotensive.  Taking Percocet for shoulder pain.  BP currently 145/80.

## 2015-04-28 NOTE — Discharge Instructions (Signed)
Cryotherapy Cryotherapy is when you put ice on your injury. Ice helps lessen pain and puffiness (swelling) after an injury. Ice works the best when you start using it in the first 24 to 48 hours after an injury. HOME CARE  Put a dry or damp towel between the ice pack and your skin.  You may press gently on the ice pack.  Leave the ice on for no more than 10 to 20 minutes at a time.  Check your skin after 5 minutes to make sure your skin is okay.  Rest at least 20 minutes between ice pack uses.  Stop using ice when your skin loses feeling (numbness).  Do not use ice on someone who cannot tell you when it hurts. This includes small children and people with memory problems (dementia). GET HELP RIGHT AWAY IF:  You have white spots on your skin.  Your skin turns blue or pale.  Your skin feels waxy or hard.  Your puffiness gets worse. MAKE SURE YOU:   Understand these instructions.  Will watch your condition.  Will get help right away if you are not doing well or get worse.   This information is not intended to replace advice given to you by your health care provider. Make sure you discuss any questions you have with your health care provider.   Document Released: 09/09/2007 Document Revised: 06/15/2011 Document Reviewed: 11/13/2010 Elsevier Interactive Patient Education 2016 [employer]  Shoulder Pain The shoulder is the joint that connects your arms to your body. The bones that form the shoulder joint include the upper arm bone (humerus), the shoulder blade (scapula), and the collarbone (clavicle). The top of the humerus is shaped like a ball and fits into a rather flat socket on the scapula (glenoid cavity). A combination of muscles and strong, fibrous tissues that connect muscles to bones (tendons) support your shoulder joint and hold the ball in the socket. Small, fluid-filled sacs (bursae) are located in different areas of the joint. They act as cushions between the bones  and the overlying soft tissues and help reduce friction between the gliding tendons and the bone as you move your arm. Your shoulder joint allows a wide range of motion in your arm. This range of motion allows you to do things like scratch your back or throw a ball. However, this range of motion also makes your shoulder more prone to pain from overuse and injury. Causes of shoulder pain can originate from both injury and overuse and usually can be grouped in the following four categories:  Redness, swelling, and pain (inflammation) of the tendon (tendinitis) or the bursae (bursitis).  Instability, such as a dislocation of the joint.  Inflammation of the joint (arthritis).  Broken bone (fracture). HOME CARE INSTRUCTIONS   Apply ice to the sore area.  Put ice in a plastic bag.  Place a towel between your skin and the bag.  Leave the ice on for 15-20 minutes, 3-4 times per day for the first 2 days, or as directed by your health care provider.  Stop using cold packs if they do not help with the pain.  If you have a shoulder sling or immobilizer, wear it as long as your caregiver instructs. Only remove it to shower or bathe. Move your arm as little as possible, but keep your hand moving to prevent swelling.  Squeeze a soft ball or foam pad as much as possible to help prevent swelling.  Only take over-the-counter or prescription medicines for  pain, discomfort, or fever as directed by your caregiver. SEEK MEDICAL CARE IF:   Your shoulder pain increases, or new pain develops in your arm, hand, or fingers.  Your hand or fingers become cold and numb.  Your pain is not relieved with medicines. SEEK IMMEDIATE MEDICAL CARE IF:   Your arm, hand, or fingers are numb or tingling.  Your arm, hand, or fingers are significantly swollen or turn white or blue. MAKE SURE YOU:   Understand these instructions.  Will watch your condition.  Will get help right away if you are not doing well or get  worse.   This information is not intended to replace advice given to you by your health care provider. Make sure you discuss any questions you have with your health care provider.   Follow-up with your PCP tomorrow for blood pressure medication management and pain management. Keep appointment for MRI on 05/07/15. Follow-up with orthopedic surgeon as indicated. Keep arm sling on throughout the day. Apply ice to affected area. Take pain medication as needed for pain. Return to the emergency department if you expands of increasing ear pain, numbness or tingling in extremity, significant increase in swelling, chest pain, shortness of breath, loss of consciousness or dizziness.

## 2015-05-01 NOTE — ED Provider Notes (Signed)
CSN: [id number]     Arrival date & time 04/28/15  1744 History   First MD Initiated Contact with Patient 04/28/15 1929     Chief Complaint  Patient presents with  . Shoulder Pain     (Consider location/radiation/quality/duration/timing/severity/associated sxs/prior Treatment) HPI   Gloria Lewis is a 60 y.o F with a pmhx of HTN, OA, HLD, bipolar 1, schizo-affective psychosis who presents to the ED c/o R shoulder pain. Pt states that she had shoulder surgery performed 01/2015. Pt has had worsening pain in her R shoulder since with associated swelling in her R arm down to her elbow. Pt wears an arm sling at night time, but not during the day. Pt is scheduled for MRI 05/07/15. Pt has been taking oxycodone 5mg  once per day from Dr. Onnie Graham, her orthopedic surgeon, which is not controlling her pain. Pt was seen in the ED yesterday and had repeat x-rays performed and Doppler study to r/o DVT. Both studies were negative. Pt is here again for worsening pain.  Pt also states that while she was in the ED yesterday she had a very low BP. Pt takes multiple home BP medications. Pt states that she took her BP at home this morning and it read 70/50. Denies dizziness, lightheadedness. No fevers or infectious symptoms. Pts  BP in ED today is 145/80. Pt is scheduled to f/u with PCP tomorrow for BP medication management.   Past Medical History  Diagnosis Date  . HTN (hypertension)   . Bipolar 1 disorder ([place])   . OA (osteoarthritis)   . GERD (gastroesophageal reflux disease)   . Hypercholesterolemia   . Fever blister   . HA (headache)   . Colon polyp   . CTS (carpal tunnel syndrome)   . Schizo-affective psychosis ([place])   . Anxiety   . Depression   . Migraines    Past Surgical History  Procedure Laterality Date  . Neck surgery  2009  . Carpal tunnel release  20110 rt/lt  . Polp removed  2011  . Back injection    . 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 Anterior  cervical decomp/discectomy fusion  08/27/2011    Procedure: ANTERIOR CERVICAL DECOMPRESSION/DISCECTOMY FUSION 1 LEVEL/HARDWARE REMOVAL;  Surgeon: Eustace Moore, MD;  Location: [place];  Service: Neurosurgery;  Laterality: Bilateral;  Cervical four-five Anterior cervical decompression/diskectomy, fusion, Plate, Removal of Cervical five-seven Plate  . Hemorrhoid surgery    . Cardiac catheterization N/A 11/09/2014    Procedure: Right Heart Cath;  Surgeon: Larey Dresser, MD;  Location: [place];  Service: Cardiovascular;  Laterality: N/A;  . Shoulder arthroscopy with subacromial decompression Right 01/24/2015    Procedure: RIGHT SHOULDER ARTHROSCOPY WITH SUBACROMIAL DECOMPRESSION AD DISTAL CLAVICLE RESECTION ;  Surgeon: Justice Britain, MD;  Location: [place] [facility];  Service: Orthopedics;  Laterality: Right;   Family History  Problem Relation Age of Onset  . Coronary artery disease Father   . Hypertension Mother   . Schizophrenia Mother   . Depression Brother   . Prostate cancer Brother   . Anesthesia problems Neg Hx   . Hypotension Neg Hx   . Malignant hyperthermia Neg Hx   . Pseudochol deficiency Neg Hx   . Cancer Father     head neck    Social History  Substance Use Topics  . Smoking status: Former Smoker -- 0.10 packs/day for 30 years    Types: Cigarettes  . Smokeless  tobacco: Never Used     Comment: using nicotrol inhaler  . Alcohol Use: No   OB History    No data available     Review of Systems  All other systems reviewed and are negative.     Allergies  Amoxicillin; Chantix; Effexor; Norco; Paroxetine hcl; Penicillins; Tramadol; Zithromax; and Hydrocodone  Home Medications   Prior to Admission medications   Medication Sig Start Date End Date Taking? Authorizing Provider  amLODipine (NORVASC) 5 MG tablet Take 5 mg by mouth daily.    Historical Provider, MD  aspirin EC 81 MG EC tablet Take 1 tablet (81 mg total) by mouth daily. 06/23/14   Orson Eva, MD   atorvastatin (LIPITOR) 10 MG tablet Take 10 mg by mouth daily.    Historical Provider, MD  budesonide-formoterol (SYMBICORT) 160-4.5 MCG/ACT inhaler Inhale 2 puffs into the lungs 2 (two) times daily as needed (for shortness of breath and wheezing).     Historical Provider, MD  cholecalciferol (VITAMIN D) 1000 UNITS tablet Take 1,000 Units by mouth daily.    Historical Provider, MD  cyclobenzaprine (FLEXERIL) 10 MG tablet Take 1 tablet (10 mg total) by mouth 3 (three) times daily as needed for muscle spasms. 01/24/15   Olivia Mackie Shuford, PA-C  diazepam (VALIUM) 5 MG tablet Take 1 tablet (5 mg total) by mouth 4 (four) times daily. Patient taking differently: Take 10 mg by mouth 2 (two) times daily.  03/20/15   Norma Fredrickson, MD  docusate sodium (COLACE) 100 MG capsule Take 100 mg by mouth daily as needed for mild constipation.    Historical Provider, MD  fluticasone (FLONASE) 50 MCG/ACT nasal spray Place 2 sprays into both nostrils daily as needed for allergies.  06/07/14   Historical Provider, MD  fluticasone (FLONASE) 50 MCG/ACT nasal spray Place 2 sprays into both nostrils daily. 03/21/15   Mercedes Camprubi-Soms, PA-C  folic acid (FOLVITE) 1 MG tablet Take 1 mg by mouth daily.    Historical Provider, MD  HYDROmorphone (DILAUDID) 2 MG tablet Take 1-2 tablets (2-4 mg total) by mouth every 4 (four) hours as needed for severe pain. Patient not taking: Reported on 02/06/2015 01/24/15   Jenetta Loges, PA-C  Levomilnacipran HCl ER 40 MG CP24 Take 40 mg by mouth daily. 03/20/15   Norma Fredrickson, MD  Linaclotide [facility]) 145 MCG CAPS capsule Take 145 mcg by mouth daily as needed (constipation).     Historical Provider, MD  loratadine (CLARITIN) 10 MG tablet Take 1 tablet (10 mg total) by mouth daily. 03/21/15   Mercedes Camprubi-Soms, PA-C  metoCLOPramide (REGLAN) 10 MG tablet Take 1 tablet (10 mg total) by mouth every 6 (six) hours as needed for nausea (nausea/headache). 03/21/15   Mercedes Camprubi-Soms, PA-C   Multiple Vitamins-Minerals (MULTIVITAMIN WITH MINERALS) tablet Take 1 tablet by mouth every morning.     Historical Provider, MD  naproxen (NAPROSYN) 500 MG tablet Take 1 tablet (500 mg total) by mouth 2 (two) times daily as needed for mild pain, moderate pain or headache (TAKE WITH MEALS.). 03/21/15   Mercedes Camprubi-Soms, PA-C  nebivolol (BYSTOLIC) 10 MG tablet Take 1 tablet (10 mg total) by mouth daily. 08/14/14   Tammy S Parrett, NP  nicotine (NICOTROL) 10 MG inhaler Inhale 1 continuous puffing into the lungs daily as needed for smoking cessation.     Historical Provider, MD  nortriptyline (PAMELOR) 25 MG capsule 2  qhs Patient taking differently: Take 50 mg by mouth at bedtime. 2  qhs 03/20/15   Berneta Sages  Plovsky, MD  ondansetron (ZOFRAN) 4 MG tablet Take 1 tablet (4 mg total) by mouth every 8 (eight) hours as needed for nausea or vomiting. 01/24/15   Olivia Mackie Shuford, PA-C  oxyCODONE-acetaminophen (PERCOCET/ROXICET) 5-325 MG tablet Take 2 tablets by mouth every 4 (four) hours as needed for severe pain. 04/28/15   Samantha Tripp Dowless, PA-C  pantoprazole (PROTONIX) 40 MG tablet Take 40 mg by mouth daily before breakfast.  06/25/14   Historical Provider, MD  potassium chloride SA (K-DUR,KLOR-CON) 20 MEQ tablet Take 40 mEq by mouth daily.     Historical Provider, MD  sucralfate (CARAFATE) 1 G tablet Take 1 tablet (1 g total) by mouth 3 (three) times daily with meals. 06/24/14   Orson Eva, MD  triamterene-hydrochlorothiazide (MAXZIDE-25) 37.5-25 MG per tablet Take 1 tablet by mouth daily.    Historical Provider, MD  valACYclovir (VALTREX) 1000 MG tablet Take 1,000 mg by mouth daily as needed (for out breaks).     Historical Provider, MD  vitamin B-12 (CYANOCOBALAMIN) 1000 MCG tablet Take 1,000 mcg by mouth daily.    Historical Provider, MD  zolpidem (AMBIEN CR) 12.5 MG CR tablet Take 1 tablet (12.5 mg total) by mouth at bedtime as needed for sleep. 03/20/15   Norma Fredrickson, MD   BP 131/82 mmHg  Pulse  92  Temp(Src) 98.7 F (37.1 C) (Oral)  Resp 18  SpO2 99% Physical Exam  Constitutional: She is oriented to person, place, and time. She appears well-developed and well-nourished. No distress.  HENT:  Head: Normocephalic and atraumatic.  Eyes: Conjunctivae are normal. Right eye exhibits no discharge. Left eye exhibits no discharge. No scleral icterus.  Cardiovascular: Normal rate, normal heart sounds and intact distal pulses.   Pulmonary/Chest: Effort normal and breath sounds normal.  Musculoskeletal:       Right shoulder: She exhibits decreased range of motion ( limited by pain) and tenderness. She exhibits no swelling, no deformity and normal strength.  Neurological: She is alert and oriented to person, place, and time. Coordination normal.  Skin: Skin is warm and dry. No rash noted. She is not diaphoretic. No erythema. No pallor.  Psychiatric: She has a normal mood and affect. Her behavior is normal.  Nursing note and vitals reviewed.   ED Course  Procedures (including critical care time) Labs Review Labs Reviewed - No data to display  Imaging Review No results found. I have personally reviewed and evaluated these images and lab results as part of my medical decision-making.   EKG Interpretation   Date/Time:  Sunday April 28 2015 17:53:33 EST Ventricular Rate:  88 PR Interval:  154 QRS Duration: 86 QT Interval:  350 QTC Calculation: 423 R Axis:   40 Text Interpretation:  Normal sinus rhythm Nonspecific ST and T wave  abnormality Abnormal ECG ED PHYSICIAN INTERPRETATION AVAILABLE IN [facility] Confirmed by TEST, Record ([id number]) on 04/29/2015 6:41:41 AM      MDM   Final diagnoses:  Right shoulder pain    Pt presents with acute on chronic R shoulder pain since surgery in 01/2015. No sign of swelling or deformity on exam. Pt scheduled to have MRI of shoulder on 05/07/15. Pt seen in ED yesterday for same symptoms and had negative DVT study and negative Xray. No  additional imaging necessary at this time. Pt here for pain management. Pt taking home oxycodone 5mg  once per day as prescribed by Dr. Onnie Graham which is not controlling her pain. Pt given fentanyl injection in ED which seemed to  improve her pain.  Pt also states that her BP has been low at home and was low in ED yesterday. No dizziness, light headed ness or syncope. In ED today, pts BP is stable at 145/80. Pt is scheduled to f/u with her PCP tomorrow for BP medication management. Will send pt home with very short course of pain medication until she sees PCP tomorrow who can better manage her pain as an out pt. Return precautions outlined in patient discharge instructions.      [place], PA-C 05/01/15 [id number]  Forde Dandy, MD 05/01/15 [phone]

## 2015-05-17 ENCOUNTER — Ambulatory Visit (INDEPENDENT_AMBULATORY_CARE_PROVIDER_SITE_OTHER): Payer: Self-pay | Admitting: Psychiatry

## 2015-05-17 ENCOUNTER — Encounter (HOSPITAL_COMMUNITY): Payer: Self-pay | Admitting: Psychiatry

## 2015-05-17 VITALS — BP 160/80 | HR 68 | Ht 67.0 in | Wt 232.2 lb

## 2015-05-17 DIAGNOSIS — F332 Major depressive disorder, recurrent severe without psychotic features: Secondary | ICD-10-CM

## 2015-05-17 MED ORDER — DIAZEPAM 5 MG PO TABS: 10.0000 mg | ORAL_TABLET | Freq: Two times a day (BID) | ORAL | Status: DC

## 2015-05-17 MED ORDER — LEVOMILNACIPRAN HCL ER 40 MG PO CP24: 40.0000 mg | ORAL_CAPSULE | Freq: Every day | ORAL | Status: DC

## 2015-05-17 MED ORDER — NORTRIPTYLINE HCL 25 MG PO CAPS: 50.0000 mg | ORAL_CAPSULE | Freq: Every day | ORAL | Status: DC

## 2015-05-17 NOTE — Progress Notes (Signed)
[facility] MD Progress Note  05/17/2015 11:58 AM SAMANTHANICOLE IBAY  MRN:  [id number] Subjective:  Shoulder hurting Principal Problem: Major Depression,recurent Mild Diagnosis: Major Depression, Recurent Patient going back to surgery this week to repair her failed shoulder.  Family actually doing well. Mood is stable , not suicidal. No drugs  No Alcohol.Getting along better withdaughter. School on hold for now. Other then shoulder medically stable. Takes all meds without problems.   Patient Active Problem List   Diagnosis Date Noted  . Chronic migraine without aura without status migrainosus, not intractable [G43.709] 10/18/2014  . Tobacco abuse [Z72.0] 10/18/2014  . Obesity [E66.9] 09/23/2014  . COPD GOLD 0 still smoking  [J44.9] 09/02/2014  . Pulmonary hypertension ([place]) [I27.2] 08/31/2014  . Respiratory failure with hypoxia ([place]) [J96.91] 08/14/2014  . Cigarette smoker [Z72.0] 07/28/2014  . Major depressive disorder, recurrent episode, moderate ([place]) [F33.1] 07/06/2014  . Essential hypertension [I10]   . SOB (shortness of breath) [R06.02] 06/21/2014  . Precordial pain [R07.2] 06/21/2014  . GERD (gastroesophageal reflux disease) [K21.9] 06/21/2014  . Chest pain [R07.9] 06/21/2014  . HTN (hypertension) [I10]   . Neck pain [M54.2] 01/08/2014  . Major depressive disorder, recurrent episode, severe, without mention of psychotic behavior [F33.2] 10/14/2012  . Schizoaffective disorder ([place]) [F25.9] 05/26/2012  . 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 Hyperparathyroidism, primary ([place]) [E21.0] 10/06/2010  . DEGENERATIVE DISC DISEASE, LUMBOSACRAL SPINE [M51.37] 05/21/2007  . DERMATOPHYTOSIS OF THE BODY [B35.4] 05/10/2007  . Depressive type psychosis ([place]) [F32.3] 03/24/2007  . Anxiety state [F41.1] 03/24/2007  . DENTAL PAIN [K08.9] 03/24/2007  . SHOULDER PAIN, LEFT [M25.519] 03/24/2007   Total Time spent with patient: 15 minutes  Past Psychiatric History:   Past Medical History:  Past  Medical History  Diagnosis Date  . HTN (hypertension)   . Bipolar 1 disorder ([place])   . OA (osteoarthritis)   . GERD (gastroesophageal reflux disease)   . Hypercholesterolemia   . Fever blister   . HA (headache)   . Colon polyp   . CTS (carpal tunnel syndrome)   . Schizo-affective psychosis ([place])   . Anxiety   . Depression   . Migraines     Past Surgical History  Procedure Laterality Date  . Neck surgery  2009  . Carpal tunnel release  20110 rt/lt  . Polp removed  2011  . Back injection    . 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 Anterior cervical decomp/discectomy fusion  08/27/2011    Procedure: ANTERIOR CERVICAL DECOMPRESSION/DISCECTOMY FUSION 1 LEVEL/HARDWARE REMOVAL;  Surgeon: Eustace Moore, MD;  Location: [place] [facility];  Service: Neurosurgery;  Laterality: Bilateral;  Cervical four-five Anterior cervical decompression/diskectomy, fusion, Plate, Removal of Cervical five-seven Plate  . Hemorrhoid surgery    . Cardiac catheterization N/A 11/09/2014    Procedure: Right Heart Cath;  Surgeon: Larey Dresser, MD;  Location: [place] [facility];  Service: Cardiovascular;  Laterality: N/A;  . Shoulder arthroscopy with subacromial decompression Right 01/24/2015    Procedure: RIGHT SHOULDER ARTHROSCOPY WITH SUBACROMIAL DECOMPRESSION AD DISTAL CLAVICLE RESECTION ;  Surgeon: Justice Britain, MD;  Location: [place];  Service: Orthopedics;  Laterality: Right;   Family History:  Family History  Problem Relation Age of Onset  . Coronary artery disease Father   . Hypertension Mother   . Schizophrenia Mother   . Depression Brother   . Prostate cancer Brother   . Anesthesia problems Neg Hx   . Hypotension  Neg Hx   . Malignant hyperthermia Neg Hx   . Pseudochol deficiency Neg Hx   . Cancer Father     head neck    Family Psychiatric  History:  Social History:  History  Alcohol Use No     History  Drug Use No    Social History   Social History  .  Marital Status: Single    Spouse Name: N/A  . Number of Children: N/A  . Years of Education: N/A   Social History Main Topics  . Smoking status: Current Every Day Smoker -- 0.10 packs/day for 30 years    Types: Cigarettes  . Smokeless tobacco: Never Used     Comment: using nicotrol inhaler  . Alcohol Use: No  . Drug Use: No  . Sexual Activity: No   Other Topics Concern  . None   Social History Narrative   Additional Social History:                         Sleep: Good  Appetite:  Fair  Current Medications: Current Outpatient Prescriptions  Medication Sig Dispense Refill  . aspirin EC 81 MG EC tablet Take 1 tablet (81 mg total) by mouth daily. 30 tablet 0  . atorvastatin (LIPITOR) 10 MG tablet Take 10 mg by mouth daily.    . budesonide-formoterol (SYMBICORT) 160-4.5 MCG/ACT inhaler Inhale 2 puffs into the lungs 2 (two) times daily as needed (for shortness of breath and wheezing).     . cholecalciferol (VITAMIN D) 1000 UNITS tablet Take 1,000 Units by mouth daily.    . cyclobenzaprine (FLEXERIL) 10 MG tablet Take 1 tablet (10 mg total) by mouth 3 (three) times daily as needed for muscle spasms. 30 tablet 1  . diazepam (VALIUM) 5 MG tablet Take 2 tablets (10 mg total) by mouth 2 (two) times daily. 120 tablet 4  . docusate sodium (COLACE) 100 MG capsule Take 100 mg by mouth daily as needed for mild constipation.    . fluticasone (FLONASE) 50 MCG/ACT nasal spray Place 2 sprays into both nostrils daily. 16 g 0  . folic acid (FOLVITE) 1 MG tablet Take 1 mg by mouth daily.    Marland Kitchen HYDROmorphone (DILAUDID) 2 MG tablet Take 1-2 tablets (2-4 mg total) by mouth every 4 (four) hours as needed for severe pain. (Patient not taking: Reported on 02/06/2015) 50 tablet 0  . Levomilnacipran HCl ER 40 MG CP24 Take 40 mg by mouth daily. 30 capsule 5  . Linaclotide (LINZESS) 145 MCG CAPS capsule Take 145 mcg by mouth daily as needed (constipation).     Marland Kitchen loratadine (CLARITIN) 10 MG tablet Take  1 tablet (10 mg total) by mouth daily. 30 tablet 0  . metoCLOPramide (REGLAN) 10 MG tablet Take 1 tablet (10 mg total) by mouth every 6 (six) hours as needed for nausea (nausea/headache). 6 tablet 0  . Multiple Vitamins-Minerals (MULTIVITAMIN WITH MINERALS) tablet Take 1 tablet by mouth every morning.     . naproxen (NAPROSYN) 500 MG tablet Take 1 tablet (500 mg total) by mouth 2 (two) times daily as needed for mild pain, moderate pain or headache (TAKE WITH MEALS.). (Patient not taking: Reported on 05/17/2015) 20 tablet 0  . nebivolol (BYSTOLIC) 10 MG tablet Take 1 tablet (10 mg total) by mouth daily. 30 tablet 1  . nicotine (NICOTROL) 10 MG inhaler Inhale 1 continuous puffing into the lungs daily as needed for smoking cessation.     Marland Kitchen  nortriptyline (PAMELOR) 25 MG capsule Take 2 capsules (50 mg total) by mouth at bedtime. 2  qhs 60 capsule 8  . ondansetron (ZOFRAN) 4 MG tablet Take 1 tablet (4 mg total) by mouth every 8 (eight) hours as needed for nausea or vomiting. 20 tablet 1  . oxyCODONE-acetaminophen (PERCOCET/ROXICET) 5-325 MG tablet Take 2 tablets by mouth every 4 (four) hours as needed for severe pain. 6 tablet 0  . pantoprazole (PROTONIX) 40 MG tablet Take 40 mg by mouth daily before breakfast.   12  . potassium chloride SA (K-DUR,KLOR-CON) 20 MEQ tablet Take 20 mEq by mouth 2 (two) times daily.     . sucralfate (CARAFATE) 1 G tablet Take 1 tablet (1 g total) by mouth 3 (three) times daily with meals. 90 tablet 0  . valACYclovir (VALTREX) 1000 MG tablet Take 1,000 mg by mouth daily as needed (for out breaks).     . vitamin B-12 (CYANOCOBALAMIN) 1000 MCG tablet Take 1,000 mcg by mouth daily.    Marland Kitchen zolpidem (AMBIEN CR) 12.5 MG CR tablet Take 1 tablet (12.5 mg total) by mouth at bedtime as needed for sleep. 30 tablet 5   No current facility-administered medications for this visit.    Lab Results: No results found for this or any previous visit (from the past 48 hour(s)).  Physical  Findings: AIMS:  , ,  ,  ,    CIWA:    COWS:     Musculoskeletal: Strength & Muscle Tone: within normal limits Gait & Station: normal Patient leans: N/A  Psychiatric Specialty Exam: ROS  Blood pressure 160/80, pulse 68, height 5\' 7"  (1.702 m), weight 232 lb 3.2 oz (105.325 kg).Body mass index is 36.36 kg/(m^2).  General Appearance: Casual  Eye Contact::  Good  Speech:  Clear and Coherent  Volume:  Normal  Mood:  Euthymic  Affect:  Congruent  Thought Process:  Coherent  Orientation:  Full (Time, Place, and Person)  Thought Content:  WDL  Suicidal Thoughts:  No  Homicidal Thoughts:  No  Memory:  NA  Judgement:  Good  Insight:  Fair  Psychomotor Activity:  Normal  Concentration:  Fair  Recall:  Good  Fund of Knowledge:Good  Language: Good  Akathisia:  No  Handed:  Right  AIMS (if indicated):     Assets:   ADL's:  Intact  Cognition: WNL  Sleep:      Treatment Plan Summary:  Continue all meds. No neurlogical problems no Chest pain or SOB. Mood  Stable  No changes in meds.  Haskel Schroeder, MD 05/17/2015, 11:58 AM

## 2015-05-20 NOTE — Pre-Procedure Instructions (Signed)
Gloria Lewis  05/20/2015     Your procedure is scheduled on February 16.  Report to [facility] Admitting at 12:30 P.M.  Call this number if you have problems the morning of surgery:  [phone]   Remember:  Do not eat food or drink liquids after midnight.  Take these medicines the morning of surgery with A SIP OF WATER: Symbicort, Diazepam, Flonase, Levomilnacipran, Linzess, Loratadine, Metoclopramide (if needed), Nebivolol, Zofran (if needed), Oxycodone (if needed), Pantoprazole,    STOP Vitamin B12, Naproxen, Multiple Vitamins, Folic Acid, Vitamin D, Aspirin today   STOP/ Do not take Aspirin, Aleve, Naproxen, Advil, Ibuprofen, Motrin, Vitamins, Herbs, or Supplements starting today    Do not wear jewelry, make-up or nail polish.  Do not wear lotions, powders, or perfumes.  You may wear deodorant.  Do not shave 48 hours prior to surgery.  Men may shave face and neck.  Do not bring valuables to the hospital.  [facility] is not responsible for any belongings or valuables.  Contacts, dentures or bridgework may not be worn into surgery.  Leave your suitcase in the car.  After surgery it may be brought to your room.  For patients admitted to the hospital, discharge time will be determined by your treatment team.  Patients discharged the day of surgery will not be allowed to drive home.   [place] - Preparing for Surgery  Before surgery, you can play an important role.  Because skin is not sterile, your skin needs to be as free of germs as possible.  You can reduce the number of germs on you skin by washing with CHG (chlorahexidine gluconate) soap before surgery.  CHG is an antiseptic cleaner which kills germs and bonds with the skin to continue killing germs even after washing.  Please DO NOT use if you have an allergy to CHG or antibacterial soaps.  If your skin becomes reddened/irritated stop using the CHG and inform your nurse when you arrive at Short  Stay.  Do not shave (including legs and underarms) for at least 48 hours prior to the first CHG shower.  You may shave your face.  Please follow these instructions carefully:   1.  Shower with CHG Soap the night before surgery and the morning of Surgery.  2.  If you choose to wash your hair, wash your hair first as usual with your normal shampoo.  3.  After you shampoo, rinse your hair and body thoroughly to remove the shampoo.  4.  Use CHG as you would any other liquid soap.  You can apply CHG directly to the skin and wash gently with scrungie or a clean washcloth.  5.  Apply the CHG Soap to your body ONLY FROM THE NECK DOWN.  Do not use on open wounds or open sores.  Avoid contact with your eyes, ears, mouth and genitals (private parts).  Wash genitals (private parts) with your normal soap.  6.  Wash thoroughly, paying special attention to the area where your surgery will be performed.  7.  Thoroughly rinse your body with warm water from the neck down.  8.  DO NOT shower/wash with your normal soap after using and rinsing off the CHG Soap.  9.  Pat yourself dry with a clean towel.            10.  Wear clean pajamas.            11.  Place clean sheets on your  bed the night of your first shower and do not sleep with pets.  Day of Surgery  Do not apply any lotions the morning of surgery.  Please wear clean clothes to the hospital/surgery center.   Please read over the following fact sheets that you were given. Pain Booklet, Coughing and Deep Breathing and Surgical Site Infection Prevention

## 2015-05-21 ENCOUNTER — Encounter (HOSPITAL_COMMUNITY): Payer: Self-pay

## 2015-05-21 ENCOUNTER — Encounter (HOSPITAL_COMMUNITY)
Admission: RE | Admit: 2015-05-21 | Discharge: 2015-05-21 | Disposition: A | Discharge status: Home / Self Care | Payer: Medicaid Other | Source: Ambulatory Visit | Attending: Orthopedic Surgery | Admitting: Orthopedic Surgery

## 2015-05-21 DIAGNOSIS — Z01818 Encounter for other preprocedural examination: Secondary | ICD-10-CM | POA: Diagnosis not present

## 2015-05-21 DIAGNOSIS — Z79899 Other long term (current) drug therapy: Secondary | ICD-10-CM | POA: Insufficient documentation

## 2015-05-21 DIAGNOSIS — Z01812 Encounter for preprocedural laboratory examination: Secondary | ICD-10-CM | POA: Insufficient documentation

## 2015-05-21 DIAGNOSIS — I10 Essential (primary) hypertension: Secondary | ICD-10-CM | POA: Diagnosis not present

## 2015-05-21 DIAGNOSIS — F319 Bipolar disorder, unspecified: Secondary | ICD-10-CM | POA: Insufficient documentation

## 2015-05-21 DIAGNOSIS — K219 Gastro-esophageal reflux disease without esophagitis: Secondary | ICD-10-CM | POA: Insufficient documentation

## 2015-05-21 DIAGNOSIS — E7800 Pure hypercholesterolemia, unspecified: Secondary | ICD-10-CM | POA: Insufficient documentation

## 2015-05-21 DIAGNOSIS — Z7982 Long term (current) use of aspirin: Secondary | ICD-10-CM | POA: Diagnosis not present

## 2015-05-21 DIAGNOSIS — Z981 Arthrodesis status: Secondary | ICD-10-CM | POA: Insufficient documentation

## 2015-05-21 DIAGNOSIS — F17210 Nicotine dependence, cigarettes, uncomplicated: Secondary | ICD-10-CM | POA: Diagnosis not present

## 2015-05-21 HISTORY — DX: Angina pectoris, unspecified: I20.9

## 2015-05-21 LAB — COMPREHENSIVE METABOLIC PANEL
ALBUMIN: 3.6 g/dL (ref 3.5–5.0)
ALT: 10 U/L — ABNORMAL LOW (ref 14–54)
AST: 19 U/L (ref 15–41)
Alkaline Phosphatase: 104 U/L (ref 38–126)
Anion gap: 13 (ref 5–15)
BUN: 8 mg/dL (ref 6–20)
CHLORIDE: 99 mmol/L — AB (ref 101–111)
CO2: 26 mmol/L (ref 22–32)
Calcium: 9.7 mg/dL (ref 8.9–10.3)
Creatinine, Ser: 1.11 mg/dL — ABNORMAL HIGH (ref 0.44–1.00)
GFR calc Af Amer: >60 mL/min (ref >60)
GFR calc non Af Amer: 54 mL/min — ABNORMAL LOW (ref >60)
GLUCOSE: 124 mg/dL — AB (ref 65–99)
POTASSIUM: 4 mmol/L (ref 3.5–5.1)
Sodium: 138 mmol/L (ref 135–145)
Total Bilirubin: 0.2 mg/dL — ABNORMAL LOW (ref 0.3–1.2)
Total Protein: 6.6 g/dL (ref 6.5–8.1)

## 2015-05-21 LAB — CBC WITH DIFFERENTIAL/PLATELET
BASOS ABS: 0 10*3/uL (ref 0.0–0.1)
BASOS PCT: 0 %
EOS PCT: 4 %
Eosinophils Absolute: 0.3 10*3/uL (ref 0.0–0.7)
HEMATOCRIT: 41.9 % (ref 36.0–46.0)
Hemoglobin: 13.6 g/dL (ref 12.0–15.0)
Lymphocytes Relative: 43 %
Lymphs Abs: 2.9 10*3/uL (ref 0.7–4.0)
MCH: 28.9 pg (ref 26.0–34.0)
MCHC: 32.5 g/dL (ref 30.0–36.0)
MCV: 89 fL (ref 78.0–100.0)
MONO ABS: 0.6 10*3/uL (ref 0.1–1.0)
Monocytes Relative: 8 %
NEUTROS ABS: 3 10*3/uL (ref 1.7–7.7)
Neutrophils Relative %: 45 %
PLATELETS: 371 10*3/uL (ref 150–400)
RBC: 4.71 MIL/uL (ref 3.87–5.11)
RDW: 14.5 % (ref 11.5–15.5)
WBC: 6.8 10*3/uL (ref 4.0–10.5)

## 2015-05-21 LAB — APTT: APTT: 30 s (ref 24–37)

## 2015-05-21 LAB — PROTIME-INR
INR: 0.97 (ref 0.00–1.49)
Prothrombin Time: 13.1 seconds (ref 11.6–15.2)

## 2015-05-21 NOTE — Progress Notes (Signed)
Patient's cardiologist is Dr. Aundra Dubin.    She had a R Heart Cath done on 11/09/14.  ECHO: 3/16  EKG: 05/08/2015  PCP is Vidal Schwalbe, MD  Patient denies having any CP recently.

## 2015-05-22 ENCOUNTER — Encounter (HOSPITAL_COMMUNITY): Payer: Self-pay

## 2015-05-22 MED ORDER — LACTATED RINGERS IV SOLN
INTRAVENOUS | Status: DC
  Administered 2015-05-23 (×2): via INTRAVENOUS

## 2015-05-22 MED ORDER — CHLORHEXIDINE GLUCONATE 4 % EX LIQD: 60.0000 mL | Freq: Once | CUTANEOUS | Status: DC

## 2015-05-22 NOTE — Progress Notes (Signed)
Anesthesia Chart Review: Patient is a 60 year old female scheduled for right shoulder arthroscopy with removal of suture anchor and possible revision rotator cuff repair on 05/23/15 by Dr. Onnie Graham.  History includes smoking, HTN, Bipolar 1 disorder, Schizoaffective psychosis, GERD, hypercholesterolemia, anxiety, depression, migraines, anginal pain (admitted 06/2014 for CP and had non-ischemic stress test; denied any recent CP at PAT), pituitary surgery, C4-5 ACDF with removal of C5-7 hardware '13, right shoulder rotator cuff repair 01/24/15. BMI is consistent with obesity. Saw pulmonologists Dr. Melvyn Novas and Dr. [name] and cardiologist Dr. Aundra Dubin last year for evaluation of pulmonary hypertension but RHC showed no clinically significant pulmonary hypertension.  Meds include amlodipine, ASA, Lipitor, Flexeril, Valium, Flonase, folic acid, Levomilnacipran HCl, Linzess, Reglan, Bystolic, nicotine inhaler, Pamelor, Percocet, Protonix, Carafate, Valtrex, Ambien.  04/28/15 EKG: NSR, non-specific ST/T wave abnormality. Diffuse baseline wanderer.  11/09/14 RHC:  Right Heart Pressures RHC Procedural Findings: Hemodynamics (mmHg) RA mean 9 RV 35/8 PA 35/13, mean 22 PCWP mean 7 Oxygen saturations: PA 78% AO 100% Cardiac Output (Fick) 6.9  Cardiac Index (Fick) 3.27 PVR 2.2 WU 1. Near-normal right and left heart filling pressures.  2. Borderline elevated PA pressure. However, she does not have clinically significant pulmonary hypertension.   08/10/14 Echo with bubble study: Study Conclusions - Left ventricle: The cavity size was normal. Systolic function was normal. The estimated ejection fraction was in the range of 55% to 60%. There is hypokinesis of the mid-apicalinferior myocardium. Doppler parameters are consistent with abnormal left ventricular relaxation (grade 1 diastolic dysfunction). - Right ventricle: The cavity size was mildly dilated. Wall thickness was normal. - Right atrium: The  atrium was mildly dilated. - Atrial septum: No defect or patent foramen ovale was identified with bubble study. - Tricuspid valve: There was moderate regurgitation. - Pulmonary arteries: Systolic pressure was mildly increased. PA peak pressure: 42 mm Hg (S). Impressions: - Compared to the prior study, there has been no significant interval change.  06/24/14 Nuclear stress test: IMPRESSION: 1. No reversible ischemia or infarction. 2. Normal left ventricular wall motion. 3. Left ventricular ejection fraction 57% 4. Low-risk stress test findings*.  08/03/14 Chest CTA: IMPRESSION: No evidence of pulmonary embolism. Right middle lobe and lingular scarring versus atelectasis.  08/31/14 PFTs: FVC 2.23 (74%), FEV1 1.84 (PRE 77%, POST 80%), DLCOunc 15.80 (57%).  Preoperative labs noted.   If no acute changes then I anticipate that she can proceed as planned.  George Hugh [facility] Short Stay Center/Anesthesiology Phone [phone] 05/22/2015 9:54 AM

## 2015-05-23 ENCOUNTER — Observation Stay (HOSPITAL_COMMUNITY)
Admission: RE | Admit: 2015-05-23 | Discharge: 2015-05-24 | Disposition: A | Discharge status: Home / Self Care | Payer: Medicaid Other | Source: Ambulatory Visit | Attending: Orthopedic Surgery | Admitting: Orthopedic Surgery

## 2015-05-23 ENCOUNTER — Encounter (HOSPITAL_COMMUNITY)
Admission: RE | Disposition: A | Discharge status: Home / Self Care | Payer: Self-pay | Source: Ambulatory Visit | Attending: Orthopedic Surgery

## 2015-05-23 ENCOUNTER — Ambulatory Visit (HOSPITAL_COMMUNITY): Payer: Medicaid Other | Admitting: Vascular Surgery

## 2015-05-23 ENCOUNTER — Ambulatory Visit (HOSPITAL_COMMUNITY): Payer: Medicaid Other | Admitting: Anesthesiology

## 2015-05-23 ENCOUNTER — Encounter (HOSPITAL_COMMUNITY): Payer: Self-pay | Admitting: Surgery

## 2015-05-23 DIAGNOSIS — K219 Gastro-esophageal reflux disease without esophagitis: Secondary | ICD-10-CM | POA: Insufficient documentation

## 2015-05-23 DIAGNOSIS — Z79899 Other long term (current) drug therapy: Secondary | ICD-10-CM | POA: Diagnosis not present

## 2015-05-23 DIAGNOSIS — E7800 Pure hypercholesterolemia, unspecified: Secondary | ICD-10-CM | POA: Insufficient documentation

## 2015-05-23 DIAGNOSIS — Z7951 Long term (current) use of inhaled steroids: Secondary | ICD-10-CM | POA: Diagnosis not present

## 2015-05-23 DIAGNOSIS — I10 Essential (primary) hypertension: Secondary | ICD-10-CM | POA: Insufficient documentation

## 2015-05-23 DIAGNOSIS — Z7982 Long term (current) use of aspirin: Secondary | ICD-10-CM | POA: Diagnosis not present

## 2015-05-23 DIAGNOSIS — M13811 Other specified arthritis, right shoulder: Secondary | ICD-10-CM | POA: Diagnosis not present

## 2015-05-23 DIAGNOSIS — F319 Bipolar disorder, unspecified: Secondary | ICD-10-CM | POA: Diagnosis not present

## 2015-05-23 DIAGNOSIS — Z419 Encounter for procedure for purposes other than remedying health state, unspecified: Secondary | ICD-10-CM

## 2015-05-23 DIAGNOSIS — F17210 Nicotine dependence, cigarettes, uncomplicated: Secondary | ICD-10-CM | POA: Diagnosis not present

## 2015-05-23 DIAGNOSIS — Z9889 Other specified postprocedural states: Secondary | ICD-10-CM

## 2015-05-23 DIAGNOSIS — F259 Schizoaffective disorder, unspecified: Secondary | ICD-10-CM | POA: Diagnosis not present

## 2015-05-23 HISTORY — PX: SHOULDER ARTHROSCOPY WITH ROTATOR CUFF REPAIR: SHX5685

## 2015-05-23 SURGERY — ARTHROSCOPY, SHOULDER, WITH ROTATOR CUFF REPAIR
Anesthesia: Regional | Site: Shoulder | Laterality: Right

## 2015-05-23 MED ORDER — FENTANYL CITRATE (PF) 100 MCG/2ML IJ SOLN
50.0000 ug | Freq: Once | INTRAMUSCULAR | Status: AC
  Administered 2015-05-23: 50 ug via INTRAVENOUS
  Filled 2015-05-23: qty 1

## 2015-05-23 MED ORDER — LEVOMILNACIPRAN HCL ER 40 MG PO CP24: 40.0000 mg | ORAL_CAPSULE | Freq: Every day | ORAL | Status: DC

## 2015-05-23 MED ORDER — MIDAZOLAM HCL 2 MG/2ML IJ SOLN
INTRAMUSCULAR | Status: AC
  Administered 2015-05-23: 2 mg via INTRAVENOUS
  Filled 2015-05-23: qty 2

## 2015-05-23 MED ORDER — ROCURONIUM BROMIDE 50 MG/5ML IV SOLN
INTRAVENOUS | Status: AC
  Filled 2015-05-23: qty 1

## 2015-05-23 MED ORDER — METOCLOPRAMIDE HCL 5 MG PO TABS: 5.0000 mg | ORAL_TABLET | Freq: Three times a day (TID) | ORAL | Status: DC | PRN

## 2015-05-23 MED ORDER — EPHEDRINE SULFATE 50 MG/ML IJ SOLN
INTRAMUSCULAR | Status: AC
  Filled 2015-05-23: qty 1

## 2015-05-23 MED ORDER — FLUTICASONE PROPIONATE 50 MCG/ACT NA SUSP
2.0000 | Freq: Every day | NASAL | Status: DC
  Administered 2015-05-24: 2 via NASAL
  Filled 2015-05-23: qty 16

## 2015-05-23 MED ORDER — SUCCINYLCHOLINE CHLORIDE 20 MG/ML IJ SOLN
INTRAMUSCULAR | Status: AC
  Filled 2015-05-23: qty 1

## 2015-05-23 MED ORDER — PROPOFOL 10 MG/ML IV BOLUS
INTRAVENOUS | Status: DC | PRN
  Administered 2015-05-23: 20 mg via INTRAVENOUS
  Administered 2015-05-23: 30 mg via INTRAVENOUS
  Administered 2015-05-23: 120 mg via INTRAVENOUS

## 2015-05-23 MED ORDER — WHITE PETROLATUM GEL
Status: AC
  Administered 2015-05-23: 21:00:00
  Filled 2015-05-23: qty 1

## 2015-05-23 MED ORDER — SUCCINYLCHOLINE CHLORIDE 20 MG/ML IJ SOLN
INTRAMUSCULAR | Status: DC | PRN
  Administered 2015-05-23: 100 mg via INTRAVENOUS

## 2015-05-23 MED ORDER — ONDANSETRON HCL 4 MG/2ML IJ SOLN
INTRAMUSCULAR | Status: AC
  Filled 2015-05-23: qty 2

## 2015-05-23 MED ORDER — NEOSTIGMINE METHYLSULFATE 10 MG/10ML IV SOLN
INTRAVENOUS | Status: AC
  Filled 2015-05-23: qty 1

## 2015-05-23 MED ORDER — ROPIVACAINE HCL 5 MG/ML IJ SOLN
INTRAMUSCULAR | Status: DC | PRN
  Administered 2015-05-23: 25 mL via PERINEURAL

## 2015-05-23 MED ORDER — MIDAZOLAM HCL 2 MG/2ML IJ SOLN: 2.0000 mg | Freq: Once | INTRAMUSCULAR | Status: DC

## 2015-05-23 MED ORDER — 0.9 % SODIUM CHLORIDE (POUR BTL) OPTIME
TOPICAL | Status: DC | PRN
  Administered 2015-05-23: 1000 mL

## 2015-05-23 MED ORDER — AMLODIPINE BESYLATE 5 MG PO TABS
5.0000 mg | ORAL_TABLET | Freq: Every day | ORAL | Status: DC
  Administered 2015-05-24: 5 mg via ORAL
  Filled 2015-05-23: qty 1

## 2015-05-23 MED ORDER — ZOLPIDEM TARTRATE 5 MG PO TABS
5.0000 mg | ORAL_TABLET | Freq: Every evening | ORAL | Status: DC | PRN
  Administered 2015-05-23: 5 mg via ORAL
  Filled 2015-05-23: qty 1

## 2015-05-23 MED ORDER — ATORVASTATIN CALCIUM 10 MG PO TABS
10.0000 mg | ORAL_TABLET | Freq: Every day | ORAL | Status: DC
  Administered 2015-05-24: 10 mg via ORAL
  Filled 2015-05-23: qty 1

## 2015-05-23 MED ORDER — MIDAZOLAM HCL 2 MG/2ML IJ SOLN
INTRAMUSCULAR | Status: AC
  Filled 2015-05-23: qty 2

## 2015-05-23 MED ORDER — LINACLOTIDE 145 MCG PO CAPS
145.0000 ug | ORAL_CAPSULE | Freq: Every day | ORAL | Status: DC | PRN
  Filled 2015-05-23: qty 1

## 2015-05-23 MED ORDER — ONDANSETRON HCL 4 MG/2ML IJ SOLN
INTRAMUSCULAR | Status: DC | PRN
  Administered 2015-05-23: 4 mg via INTRAVENOUS

## 2015-05-23 MED ORDER — FENTANYL CITRATE (PF) 100 MCG/2ML IJ SOLN
INTRAMUSCULAR | Status: AC
  Filled 2015-05-23: qty 2

## 2015-05-23 MED ORDER — LORATADINE 10 MG PO TABS
10.0000 mg | ORAL_TABLET | Freq: Every day | ORAL | Status: DC
  Administered 2015-05-24: 10 mg via ORAL
  Filled 2015-05-23: qty 1

## 2015-05-23 MED ORDER — DIPHENHYDRAMINE HCL 12.5 MG/5ML PO ELIX
12.5000 mg | ORAL_SOLUTION | ORAL | Status: DC | PRN
  Administered 2015-05-24: 25 mg via ORAL
  Filled 2015-05-23: qty 10

## 2015-05-23 MED ORDER — ASPIRIN EC 81 MG PO TBEC
81.0000 mg | DELAYED_RELEASE_TABLET | Freq: Every day | ORAL | Status: DC
  Administered 2015-05-24: 81 mg via ORAL
  Filled 2015-05-23: qty 1

## 2015-05-23 MED ORDER — SODIUM CHLORIDE 0.9 % IR SOLN
Status: DC | PRN
  Administered 2015-05-23 (×4): 3000 mL

## 2015-05-23 MED ORDER — DIAZEPAM 5 MG PO TABS: 5.0000 mg | ORAL_TABLET | Freq: Four times a day (QID) | ORAL | Status: DC | PRN

## 2015-05-23 MED ORDER — ONDANSETRON HCL 4 MG PO TABS: 4.0000 mg | ORAL_TABLET | Freq: Three times a day (TID) | ORAL | Status: DC | PRN

## 2015-05-23 MED ORDER — EPHEDRINE SULFATE 50 MG/ML IJ SOLN
INTRAMUSCULAR | Status: DC | PRN
  Administered 2015-05-23: 10 mg via INTRAVENOUS
  Administered 2015-05-23 (×2): 5 mg via INTRAVENOUS
  Administered 2015-05-23: 10 mg via INTRAVENOUS

## 2015-05-23 MED ORDER — OXYCODONE HCL 5 MG PO TABS
5.0000 mg | ORAL_TABLET | ORAL | Status: DC | PRN
  Administered 2015-05-24 (×2): 10 mg via ORAL
  Filled 2015-05-23 (×2): qty 2

## 2015-05-23 MED ORDER — BUDESONIDE-FORMOTEROL FUMARATE 160-4.5 MCG/ACT IN AERO: 2.0000 | INHALATION_SPRAY | Freq: Two times a day (BID) | RESPIRATORY_TRACT | Status: DC | PRN

## 2015-05-23 MED ORDER — GLYCOPYRROLATE 0.2 MG/ML IJ SOLN
INTRAMUSCULAR | Status: DC | PRN
  Administered 2015-05-23 (×2): .1 mg via INTRAVENOUS

## 2015-05-23 MED ORDER — OXYCODONE HCL 10 MG PO TABS: 5.0000 mg | ORAL_TABLET | ORAL | Status: DC | PRN

## 2015-05-23 MED ORDER — SUCRALFATE 1 G PO TABS
1.0000 g | ORAL_TABLET | Freq: Three times a day (TID) | ORAL | Status: DC
  Administered 2015-05-24: 1 g via ORAL
  Filled 2015-05-23: qty 1

## 2015-05-23 MED ORDER — BISACODYL 5 MG PO TBEC: 5.0000 mg | DELAYED_RELEASE_TABLET | Freq: Every day | ORAL | Status: DC | PRN

## 2015-05-23 MED ORDER — METOCLOPRAMIDE HCL 10 MG PO TABS: 10.0000 mg | ORAL_TABLET | Freq: Four times a day (QID) | ORAL | Status: DC | PRN

## 2015-05-23 MED ORDER — ONDANSETRON HCL 4 MG/2ML IJ SOLN: 4.0000 mg | Freq: Four times a day (QID) | INTRAMUSCULAR | Status: DC | PRN

## 2015-05-23 MED ORDER — DOCUSATE SODIUM 100 MG PO CAPS: 100.0000 mg | ORAL_CAPSULE | Freq: Every day | ORAL | Status: DC | PRN

## 2015-05-23 MED ORDER — PHENYLEPHRINE HCL 10 MG/ML IJ SOLN
10.0000 mg | INTRAVENOUS | Status: DC | PRN
  Administered 2015-05-23: 15 ug/min via INTRAVENOUS

## 2015-05-23 MED ORDER — HYDROMORPHONE HCL 1 MG/ML IJ SOLN
1.0000 mg | INTRAMUSCULAR | Status: DC | PRN
  Administered 2015-05-23: 1 mg via INTRAVENOUS
  Filled 2015-05-23: qty 1

## 2015-05-23 MED ORDER — PHENYLEPHRINE HCL 10 MG/ML IJ SOLN
INTRAMUSCULAR | Status: DC | PRN
  Administered 2015-05-23: 80 ug via INTRAVENOUS
  Administered 2015-05-23 (×2): 40 ug via INTRAVENOUS
  Administered 2015-05-23 (×4): 80 ug via INTRAVENOUS
  Administered 2015-05-23 (×2): 40 ug via INTRAVENOUS
  Administered 2015-05-23 (×2): 80 ug via INTRAVENOUS
  Administered 2015-05-23 (×2): 40 ug via INTRAVENOUS
  Administered 2015-05-23: 80 ug via INTRAVENOUS
  Administered 2015-05-23: 40 ug via INTRAVENOUS
  Administered 2015-05-23: 80 ug via INTRAVENOUS
  Administered 2015-05-23: 40 ug via INTRAVENOUS

## 2015-05-23 MED ORDER — ONDANSETRON HCL 4 MG PO TABS: 4.0000 mg | ORAL_TABLET | Freq: Four times a day (QID) | ORAL | Status: DC | PRN

## 2015-05-23 MED ORDER — SODIUM CHLORIDE 0.9 % IV SOLN
INTRAVENOUS | Status: DC
  Administered 2015-05-23: 23:00:00 via INTRAVENOUS

## 2015-05-23 MED ORDER — METOCLOPRAMIDE HCL 5 MG/ML IJ SOLN: 5.0000 mg | Freq: Three times a day (TID) | INTRAMUSCULAR | Status: DC | PRN

## 2015-05-23 MED ORDER — VITAMIN D 1000 UNITS PO TABS
1000.0000 [IU] | ORAL_TABLET | Freq: Every day | ORAL | Status: DC
Start: 2015-05-24 — End: 2015-05-24
  Administered 2015-05-24: 1000 [IU] via ORAL
  Filled 2015-05-23: qty 1

## 2015-05-23 MED ORDER — TRIAMTERENE-HCTZ 37.5-25 MG PO CAPS
1.0000 | ORAL_CAPSULE | ORAL | Status: DC
  Filled 2015-05-23 (×2): qty 1

## 2015-05-23 MED ORDER — NEBIVOLOL HCL 5 MG PO TABS
10.0000 mg | ORAL_TABLET | Freq: Every day | ORAL | Status: DC
  Administered 2015-05-24: 10 mg via ORAL
  Filled 2015-05-23: qty 2

## 2015-05-23 MED ORDER — CEFAZOLIN SODIUM-DEXTROSE 2-3 GM-% IV SOLR: 2.0000 g | INTRAVENOUS | Status: DC

## 2015-05-23 MED ORDER — FENTANYL CITRATE (PF) 250 MCG/5ML IJ SOLN
INTRAMUSCULAR | Status: AC
  Filled 2015-05-23: qty 5

## 2015-05-23 MED ORDER — HYDROCODONE-ACETAMINOPHEN 5-325 MG PO TABS
ORAL_TABLET | ORAL | Status: AC
  Filled 2015-05-23: qty 2

## 2015-05-23 MED ORDER — PANTOPRAZOLE SODIUM 40 MG PO TBEC
40.0000 mg | DELAYED_RELEASE_TABLET | Freq: Every day | ORAL | Status: DC
  Administered 2015-05-24: 40 mg via ORAL
  Filled 2015-05-23: qty 1

## 2015-05-23 MED ORDER — NICOTINE 10 MG IN INHA: 1.0000 | Freq: Every day | RESPIRATORY_TRACT | Status: DC | PRN

## 2015-05-23 MED ORDER — LIDOCAINE HCL (CARDIAC) 20 MG/ML IV SOLN
INTRAVENOUS | Status: AC
  Filled 2015-05-23: qty 5

## 2015-05-23 MED ORDER — POTASSIUM CHLORIDE CRYS ER 20 MEQ PO TBCR
20.0000 meq | EXTENDED_RELEASE_TABLET | Freq: Two times a day (BID) | ORAL | Status: DC
  Administered 2015-05-24: 20 meq via ORAL
  Filled 2015-05-23: qty 1

## 2015-05-23 MED ORDER — CEFAZOLIN SODIUM-DEXTROSE 2-3 GM-% IV SOLR
INTRAVENOUS | Status: AC
  Filled 2015-05-23: qty 50

## 2015-05-23 MED ORDER — VANCOMYCIN HCL IN DEXTROSE 1-5 GM/200ML-% IV SOLN
INTRAVENOUS | Status: AC
Start: 2015-05-23 — End: 2015-05-24
  Filled 2015-05-23: qty 200

## 2015-05-23 MED ORDER — STERILE WATER FOR INJECTION IJ SOLN
INTRAMUSCULAR | Status: AC
  Filled 2015-05-23: qty 40

## 2015-05-23 MED ORDER — FENTANYL CITRATE (PF) 100 MCG/2ML IJ SOLN
INTRAMUSCULAR | Status: DC | PRN
  Administered 2015-05-23 (×2): 25 ug via INTRAVENOUS
  Administered 2015-05-23: 50 ug via INTRAVENOUS

## 2015-05-23 MED ORDER — OXYCODONE-ACETAMINOPHEN 5-325 MG PO TABS
ORAL_TABLET | ORAL | Status: AC
  Filled 2015-05-23: qty 2

## 2015-05-23 MED ORDER — VANCOMYCIN HCL IN DEXTROSE 1-5 GM/200ML-% IV SOLN
1000.0000 mg | Freq: Once | INTRAVENOUS | Status: AC
  Administered 2015-05-23: 1000 mg via INTRAVENOUS

## 2015-05-23 MED ORDER — GLYCOPYRROLATE 0.2 MG/ML IJ SOLN
INTRAMUSCULAR | Status: AC
  Filled 2015-05-23: qty 2

## 2015-05-23 MED ORDER — NORTRIPTYLINE HCL 25 MG PO CAPS
50.0000 mg | ORAL_CAPSULE | Freq: Every day | ORAL | Status: DC
  Administered 2015-05-23: 50 mg via ORAL
  Filled 2015-05-23 (×3): qty 2

## 2015-05-23 MED ORDER — OXYCODONE-ACETAMINOPHEN 5-325 MG PO TABS
2.0000 | ORAL_TABLET | Freq: Once | ORAL | Status: AC
Start: 2015-05-23 — End: 2015-05-23
  Administered 2015-05-23: 2 via ORAL
  Filled 2015-05-23: qty 2

## 2015-05-23 MED ORDER — LIDOCAINE HCL (CARDIAC) 20 MG/ML IV SOLN
INTRAVENOUS | Status: DC | PRN
  Administered 2015-05-23: 60 mg via INTRAVENOUS

## 2015-05-23 MED ORDER — DIAZEPAM 5 MG PO TABS
5.0000 mg | ORAL_TABLET | Freq: Four times a day (QID) | ORAL | Status: DC | PRN
  Administered 2015-05-23 – 2015-05-24 (×2): 5 mg via ORAL
  Filled 2015-05-23 (×2): qty 1

## 2015-05-23 SURGICAL SUPPLY — 60 items
BLADE CUTTER GATOR 3.5 (BLADE) ×3 IMPLANT
BLADE GREAT WHITE 4.2 (BLADE) ×2 IMPLANT
BLADE GREAT WHITE 4.2MM (BLADE) ×1
BLADE SURG 11 STRL SS (BLADE) ×3 IMPLANT
BOOTCOVER CLEANROOM LRG (PROTECTIVE WEAR) ×6 IMPLANT
BUR OVAL 4.0 (BURR) ×3 IMPLANT
CANISTER SUCT LVC 12 LTR MEDI- (MISCELLANEOUS) ×3 IMPLANT
CANNULA ACUFLEX KIT 5X76 (CANNULA) ×3 IMPLANT
CANNULA DRILOCK 5.0MMX75MM (CANNULA) ×1
CANNULA DRILOCK 5.0X75 (CANNULA) ×2 IMPLANT
CANNULA TWIST IN 8.25X7CM (CANNULA) ×3 IMPLANT
CLOSURE WOUND 1/2 X4 (GAUZE/BANDAGES/DRESSINGS) ×1
CONNECTOR 5 IN 1 STRAIGHT STRL (MISCELLANEOUS) ×3 IMPLANT
DRAPE INCISE 23X17 IOBAN STRL (DRAPES) ×2
DRAPE INCISE IOBAN 23X17 STRL (DRAPES) ×1 IMPLANT
DRAPE INCISE IOBAN 66X45 STRL (DRAPES) ×3 IMPLANT
DRAPE ORTHO SPLIT 77X108 STRL (DRAPES) ×6
DRAPE STERI 35X30 U-POUCH (DRAPES) ×3 IMPLANT
DRAPE SURG 17X11 SM STRL (DRAPES) ×3 IMPLANT
DRAPE SURG ORHT 6 SPLT 77X108 (DRAPES) ×2 IMPLANT
DRAPE U-SHAPE 47X51 STRL (DRAPES) ×3 IMPLANT
DRSG PAD ABDOMINAL 8X10 ST (GAUZE/BANDAGES/DRESSINGS) ×6 IMPLANT
DURAPREP 26ML APPLICATOR (WOUND CARE) ×6 IMPLANT
GAUZE SPONGE 4X4 12PLY STRL (GAUZE/BANDAGES/DRESSINGS) ×3 IMPLANT
GLOVE BIO SURGEON STRL SZ7.5 (GLOVE) ×3 IMPLANT
GLOVE BIO SURGEON STRL SZ8 (GLOVE) ×3 IMPLANT
GLOVE EUDERMIC 7 POWDERFREE (GLOVE) ×3 IMPLANT
GLOVE SS BIOGEL STRL SZ 7.5 (GLOVE) ×1 IMPLANT
GLOVE SUPERSENSE BIOGEL SZ 7.5 (GLOVE) ×2
GOWN STRL REUS W/ TWL LRG LVL3 (GOWN DISPOSABLE) ×1 IMPLANT
GOWN STRL REUS W/ TWL XL LVL3 (GOWN DISPOSABLE) ×2 IMPLANT
GOWN STRL REUS W/TWL LRG LVL3 (GOWN DISPOSABLE) ×3
GOWN STRL REUS W/TWL XL LVL3 (GOWN DISPOSABLE) ×6
KIT BASIN OR (CUSTOM PROCEDURE TRAY) ×3 IMPLANT
KIT ROOM TURNOVER OR (KITS) ×3 IMPLANT
KIT SHOULDER TRACTION (DRAPES) ×3 IMPLANT
MANIFOLD NEPTUNE II (INSTRUMENTS) ×3 IMPLANT
NDL SCORPION MULTI FIRE (NEEDLE) IMPLANT
NDL SPNL 18GX3.5 QUINCKE PK (NEEDLE) ×1 IMPLANT
NEEDLE SCORPION MULTI FIRE (NEEDLE) IMPLANT
NEEDLE SPNL 18GX3.5 QUINCKE PK (NEEDLE) ×3 IMPLANT
NS IRRIG 1000ML POUR BTL (IV SOLUTION) ×3 IMPLANT
PACK SHOULDER (CUSTOM PROCEDURE TRAY) ×3 IMPLANT
PAD ARMBOARD 7.5X6 YLW CONV (MISCELLANEOUS) ×6 IMPLANT
SET ARTHROSCOPY TUBING (MISCELLANEOUS) ×3
SET ARTHROSCOPY TUBING LN (MISCELLANEOUS) ×1 IMPLANT
SLING ARM FOAM STRAP LRG (SOFTGOODS) ×6 IMPLANT
SLING ARM MED ADULT FOAM STRAP (SOFTGOODS) IMPLANT
SPONGE LAP 4X18 X RAY DECT (DISPOSABLE) IMPLANT
STRIP CLOSURE SKIN 1/2X4 (GAUZE/BANDAGES/DRESSINGS) ×2 IMPLANT
SUT MNCRL AB 3-0 PS2 18 (SUTURE) ×3 IMPLANT
SUT PDS AB 0 CT 36 (SUTURE) IMPLANT
SUT RETRIEVER GRASP 30 DEG (SUTURE) IMPLANT
SUT TIGER TAPE 7 IN WHITE (SUTURE) IMPLANT
SYR 20CC LL (SYRINGE) ×3 IMPLANT
TAPE PAPER 3X10 WHT MICROPORE (GAUZE/BANDAGES/DRESSINGS) ×3 IMPLANT
TOWEL OR 17X24 6PK STRL BLUE (TOWEL DISPOSABLE) ×3 IMPLANT
TOWEL OR 17X26 10 PK STRL BLUE (TOWEL DISPOSABLE) ×3 IMPLANT
WAND SUCTION MAX 4MM 90S (SURGICAL WAND) ×3 IMPLANT
WATER STERILE IRR 1000ML POUR (IV SOLUTION) ×3 IMPLANT

## 2015-05-23 NOTE — Op Note (Signed)
05/23/2015  6:07 PM  PATIENT:   Gloria Lewis  60 y.o. female  PRE-OPERATIVE DIAGNOSIS:  right shoulder displaced suture anchor  POST-OPERATIVE DIAGNOSIS:  Advanced glenohumeral arthritis, displaced suture anchor  PROCEDURE:  RSA, chondroplasty, removal cartilaginous loose bodies, bursectomy, removal retained suture anchor and suture  SURGEON:  Semisi Biela, Metta Clines M.D.  ASSISTANTS: Shuford pac   ANESTHESIA:   GET + ISB  EBL: min  SPECIMEN:  none  Drains: none   PATIENT DISPOSITION:  PACU - hemodynamically stable.    PLAN OF CARE: Discharge to home after PACU  Dictation# [id number]   Contact # [phone]

## 2015-05-23 NOTE — Op Note (Signed)
NAMEMarland Kitchen  Gloria, Lewis NO.:  [id number]  MEDICAL RECORD NO.:  [id number]  LOCATION:  5N24C                        FACILITY:  [place]  PHYSICIAN:  Metta Clines. Ouida Abeyta, M.D.  DATE OF BIRTH:  16-Aug-1955  DATE OF PROCEDURE:  05/23/2015 DATE OF DISCHARGE:                              OPERATIVE REPORT   PREOPERATIVE DIAGNOSIS:  Displaced right shoulder suture anchor.  POSTOPERATIVE DIAGNOSES: 1. Advanced glenohumeral arthritis. 2. Displaced suture anchor in the subacromial space.  PROCEDURE: 1. Right shoulder examination under anesthesia. 2. Right shoulder glenohumeral joint diagnostic arthroscopy. 3. Chondroplasty. 4. Removal of multiple cartilaginous loose bodies. 5. Arthroscopic subacromial bursectomy. 6. Removal of retained suture anchor and suture material from the     subacromial space.  SURGEON:  Metta Clines. Emani Morad, M.D.  Terrence DupontOlivia Mackie A. Shuford, P.A.-C.  ANESTHESIA:  General endotracheal as well as interscalene block.  ESTIMATED BLOOD LOSS:  Minimal.  DRAINS:  None.  HISTORY:  Gloria Lewis is a 60 year old female, who underwent a previous right shoulder arthroscopic rotator cuff repair; and unfortunately, she has had increasing pain for the last several months.  Her examination clinically shows essentially full motion, but very painful for her. Subsequent, MRI scan did show evidence for a displaced suture anchor which had imbedded itself within the deep surface of the deltoid over the middle third.  There was some attenuation of the rotator cuff, but no obvious discrete recurrent tears.  Due to her persistent pain and the displaced suture anchor, she is brought to the operating room at this time for planned right shoulder arthroscopy as described below.  Preoperatively, I counseled Gloria Lewis regarding treatment options and potential risks versus benefits thereof.  Possible surgical complications were reviewed including bleeding, infection,  neurovascular injury, persistent pain, loss of motion, anesthetic complication, recurrent rotator cuff tear, and possible need for additional surgery. She understands and accepts and agrees with our plan.  PROCEDURE IN DETAIL:  After undergoing routine preop evaluation, the patient received prophylactic antibiotics.  An interscalene block was established in the holding area by the Anesthesia Department.  Placed supine on the operating table.  Underwent smooth induction of a general endotracheal anesthesia.  Turned to left lateral decubitus position on a beanbag and appropriately padded and protected.  Right shoulder examination under anesthesia revealed full motion.  No obvious instability.  Right arm then suspended at 70 degrees abduction with 10 pounds of traction.  The right shoulder girdle region was sterilely prepped and draped in standard fashion.  A time-out was called. Posterior portal was established in the glenohumeral joint, and an anterior portal was established under direct visualization. Immediately, evident was large clumps of articular cartilage, which had been eroded off the humeral head and the glenoid and associated severe diffuse synovitis.  The degree of arthritic degeneration was quite surprising.  We introduced a shaver and used this to remove the multiple loose large portions of articular cartilage and also to affect a chondroplasty in the humerus and glenoid.  There was broad full- thickness loss of cartilage over perhaps 2/3 of the humeral head and the glenoid was severely degenerated as well.  We thoroughly irrigated the joint and removed all loose  cartilage debris and smoothened articular surfaces.  We also identified suture strands from the previous rotator cuff repair, and a portion of the suture anchor we identified attached to the suture limbs within the substance of the rotator cuff.  The suture limbs were then clipped and the small fragment of the  suture anchor was then removed.  The balance of the limbs needed to be removed from the bursal side, so we completed debridement within glenohumeral joint.  At this time, fluids and instruments were then removed.  Of note, there was no obvious complete recurrent tear of the rotator cuff, but there certainly was a number of areas of attenuation of the tissues. At this point, then we removed the fluid and instruments.  The arm was then dropped into 30 degrees of abduction.  Arthroscope introduced into the subacromial space of the posterior portal and a direct lateral portal in the subacromial space.  We completed the bursectomy, and divided multiple adhesions and then we readily identified the displaced SwiveLock suture anchor, which was imbedded in the deep surface of the deltoid, and this was then removed.  We then identified the 4 residuals of fiber tape sutures strands, which had been part of the original double row construct and each of these limbs was then freed from the bursal side of the rotator cuff, traced back to lateral anchor and then divided and removed.  All suture limbs were then successfully removed, but the 2 lateral row suture anchors were left in place.  We then completed the bursectomy.  Hemostasis was obtained.  Fluid and instruments were removed.  The portals were closed with Monocryl and Steri-Strips.  A dry dressing taped at the right shoulder.  Right arm was placed in a sling.  The patient was awakened, extubated, and taken to recovery in stable condition.  Jenetta Loges, P.A.-C., was used as an Environmental consultant throughout this case, essential for help with positioning the patient, positioning the extremity, management of the arthroscopic equipment, tissue manipulation, wound closure, and intraoperative decision making.     Metta Clines. Jayzon Taras, M.D.     KMS/MEDQ  D:  05/23/2015  T:  05/23/2015  Job:  [id number]

## 2015-05-23 NOTE — Discharge Instructions (Signed)
° °  Metta Clines. Supple, M.D., F.A.A.O.S. Orthopaedic Surgery Specializing in Arthroscopic and Reconstructive Surgery of the Shoulder and Knee [phone] [address] [place], [place] [address] - Fax [phone]   POST-OP SHOULDER ARTHROSCOPY INSTRUCTIONS  1. Call the office at [phone] to schedule your first post-op appointment 7-10 days from the date of your surgery.  2. Leave the steri-strips in place over your incisions when performing dressing changes and showering. You may remove your dressings and begin showering 72 hours from surgery. You can expect drainage that is clear to bloody in nature that occasionally will soak through your dressings. If this occurs go ahead and perform a dressing change. The drainage should lessen daily and when there is no drainage from your incisions feel free to go without a dressing.  3. Wear your sling for comfort. You may come out of your sling for ad lib activity and even decide not to use the sling at all. If you find you are more comfortable in your sling, make sure you come out of your sling at least 3-4 times a day to do the exercises that are included below.  4. Range of motion to your elbow, wrist, and hand are encouraged 3-5 times daily. Exercise to your hand and fingers helps to reduce swelling you may experience.  5. Utilize ice to the shoulder 3-4 times minimum a day and additionally if you are experiencing pain.  6. You may drive when safely off narcotics and muscle relaxants.  7. If you had a block pre-operatively to provide post-op pain relief you may want to go ahead and begin utilizing your pain meds as your arm begins to wake up. Blocks can sometimes last up to 16-18 hours. If you are still pain-free prior to going to bed you may want to strongly consider taking a pain medication to avoid being awakened in the night with the onset of pain. A muscle relaxant is also provided for you should you experience muscle spasms. It  is recommended that if you are experiencing pain that your pain medication alone is not controlling, add the muscle relaxant along with the pain medication which can give additional pain relief. The first one to two days is generally the most severe of your pain and then should gradually decrease. As your pain lessens it is recommended that you decrease your use of the pain medications to an "as needed basis" only and to always comply with the recommended dosages of the pain medications.  8. Pain medications can produce constipation along with their use. If you experience this, the use of an over the counter stool softener or laxative daily is recommended.   9. For additional questions or concerns, please do not hesitate to call the office. If after hours there is an answering service to forward your concerns to the physician on call.   POST-OP EXERCISES  The pendulum exercises should be performed while bending at the waist as far over as possible thereby letting gravity do the work for you.  Range of Motion Exercises: Pendulum (circular)  Repeat 20 times. Do 3 sessions per day.     Range of Motion Exercises: Pendulum (side-to-side)  Repeat 20 times. Do 3 sessions per day.    Range of Motion Exercises (self-stretching activities):  Slide arm up wall with palm toward you, moving closer to the wall. Hold for 5 seconds.  Repeat 10 times. Do 3 sessions per day.

## 2015-05-23 NOTE — Anesthesia Preprocedure Evaluation (Addendum)
Anesthesia Evaluation  Patient identified by MRN, date of birth, ID band Patient awake    Reviewed: Allergy & Precautions, H&P , NPO status , Patient's Chart, lab work & pertinent test results, reviewed documented beta blocker date and time   Airway Mallampati: II  TM Distance: >3 FB Neck ROM: Full    Dental no notable dental hx. (+) Edentulous Upper, Edentulous Lower, Dental Advisory Given   Pulmonary Current Smoker, former smoker,    Pulmonary exam normal breath sounds clear to auscultation       Cardiovascular hypertension, Pt. on medications Normal cardiovascular exam Rhythm:regular Rate:Normal  ECHO 08/2014 Study Conclusions  - Left ventricle: The cavity size was normal. Systolic function was normal. The estimated ejection fraction was in the range of 55% to 60%. There is hypokinesis of the mid-apicalinferior myocardium. Doppler parameters are consistent with abnormal left ventricular relaxation (grade 1 diastolic dysfunction). - Right ventricle: The cavity size was mildly dilated. Wall thickness was normal. - Right atrium: The atrium was mildly dilated. - Atrial septum: No defect or patent foramen ovale was identified with bubble study. - Tricuspid valve: There was moderate regurgitation. - Pulmonary arteries: Systolic pressure was mildly increased. PA peak pressure: 42 mm Hg (S).  Impressions:  - Compared to the prior study, there has been no significant interval change.     Neuro/Psych Anxiety Depression Bipolar Disorder    GI/Hepatic GERD  Medicated and Controlled,  Endo/Other    Renal/GU      Musculoskeletal   Abdominal   Peds  Hematology   Anesthesia Other Findings HTN (hypertension) Bipolar 1 disorder (HCC)   OA (osteoarthritis)  GERD (gastroesophageal reflux disease)     Hypercholesterolemia           Migraines   Schizo-affective psychosis ([place]          Reproductive/Obstetrics                         Anesthesia Physical  Anesthesia Plan  ASA: III  Anesthesia Plan: General and Regional   Post-op Pain Management: MAC Combined w/ Regional for Post-op pain   Induction: Intravenous and Rapid sequence  Airway Management Planned: Oral ETT  Additional Equipment:   Intra-op Plan:   Post-operative Plan: Extubation in OR  Informed Consent: I have reviewed the patients History and Physical, chart, labs and discussed the procedure including the risks, benefits and alternatives for the proposed anesthesia with the patient or authorized representative who has indicated his/her understanding and acceptance.   Dental Advisory Given  Plan Discussed with: CRNA and Surgeon  Anesthesia Plan Comments: (Discussed Brachial Plexus block as pt had done before Pt aggrees to block)       Anesthesia Quick Evaluation

## 2015-05-23 NOTE — Anesthesia Procedure Notes (Addendum)
Anesthesia Regional Block:  Interscalene brachial plexus block  Pre-Anesthetic Checklist: ,, timeout performed, Correct Patient, Correct Site, Correct Laterality, Correct Procedure, Correct Position, site marked, Risks and benefits discussed,  Surgical consent,  Pre-op evaluation,  At surgeon's request and post-op pain management  Laterality: Right  Prep: chloraprep       Needles:  Injection technique: Single-shot  Needle Type: Echogenic Stimulator Needle     Needle Length: 9cm 9 cm Needle Gauge: 21 and 21 G    Additional Needles:  Procedures: ultrasound guided (picture in chart) Interscalene brachial plexus block Narrative:  Injection made incrementally with aspirations every 5 mL.  Performed by: Personally  Anesthesiologist: JUDD, BENJAMIN  Additional Notes: Risks, benefits and alternative to block explained extensively.  Patient tolerated procedure well, without complications.   Procedure Name: Intubation Date/Time: 05/23/2015 5:07 PM Performed by: Merdis Delay Pre-anesthesia Checklist: Patient identified, Emergency Drugs available, Suction available, Patient being monitored and Timeout performed Patient Re-evaluated:Patient Re-evaluated prior to inductionOxygen Delivery Method: Circle system utilized Preoxygenation: Pre-oxygenation with 100% oxygen Intubation Type: IV induction Laryngoscope Size: Mac and 4 Grade View: Grade I Tube type: Oral Tube size: 7.5 mm Number of attempts: 1 Intubation method: succs only- no paralytic needed for shoulder scope. Placement Confirmation: ETT inserted through vocal cords under direct vision,  positive ETCO2,  CO2 detector and breath sounds checked- equal and bilateral Secured at: 22 cm Tube secured with: Tape Dental Injury: Teeth and Oropharynx as per pre-operative assessment

## 2015-05-23 NOTE — Transfer of Care (Signed)
Immediate Anesthesia Transfer of Care Note  Patient: Gloria Lewis  Procedure(s) Performed: Procedure(s): RIGHT SHOULDER ARTHROSCOPY WITH REMOVAL OF SUTURE ANCHOR AND POSSIBLE REVISION ROTATOR CUFF REPAIR (Right)  Patient Location: PACU  Anesthesia Type:General  Level of Consciousness: awake, alert  and oriented  Airway & Oxygen Therapy: Patient Spontanous Breathing and Patient connected to face mask oxygen  Post-op Assessment: Report given to RN and Post -op Vital signs reviewed and stable  Post vital signs: Reviewed and stable  Last Vitals:  Filed Vitals:   05/23/15 1625 05/23/15 1628  BP: 108/65 127/67  Pulse: 72 69  Temp:    Resp: 20 20    Complications: No apparent anesthesia complications

## 2015-05-23 NOTE — H&P (Signed)
Gloria Lewis    Chief Complaint: right shoulder displaced suture anchor HPI: The patient is a 60 y.o. female s/p right shoulder rotator cuff repair, now with increasing pain and MRI scan showing evidence of a displaced suture anchor.  Past Medical History  Diagnosis Date  . HTN (hypertension)   . Bipolar 1 disorder ([place])   . OA (osteoarthritis)   . GERD (gastroesophageal reflux disease)   . Hypercholesterolemia   . Fever blister   . HA (headache)   . Colon polyp   . CTS (carpal tunnel syndrome)   . Anxiety   . Depression   . Migraines   . Schizo-affective psychosis ([place])   . Anginal pain ([place])     admit 06/2014; had non-ischemic stress test    Past Surgical History  Procedure Laterality Date  . Neck surgery  2009  . Carpal tunnel release  20110 rt/lt  . Polp removed  2011  . Back injection    . 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 Anterior cervical decomp/discectomy fusion  08/27/2011    Procedure: ANTERIOR CERVICAL DECOMPRESSION/DISCECTOMY FUSION 1 LEVEL/HARDWARE REMOVAL;  Surgeon: Eustace Moore, MD;  Location: [facility];  Service: Neurosurgery;  Laterality: Bilateral;  Cervical four-five Anterior cervical decompression/diskectomy, fusion, Plate, Removal of Cervical five-seven Plate  . Hemorrhoid surgery    . Shoulder arthroscopy with subacromial decompression Right 01/24/2015    Procedure: RIGHT SHOULDER ARTHROSCOPY WITH SUBACROMIAL DECOMPRESSION AD DISTAL CLAVICLE RESECTION ;  Surgeon: Justice Britain, MD;  Location: [facility];  Service: Orthopedics;  Laterality: Right;  . Cardiac catheterization N/A 11/09/2014    Procedure: Right Heart Cath;  Surgeon: Larey Dresser, MD;  Location: [facility];  Service: Cardiovascular;  Laterality: N/A;  . Multiple tooth extractions    . Vaginal delivery      x3    Family History  Problem Relation Age of Onset  . Coronary artery disease Father   . Hypertension Mother   . Schizophrenia  Mother   . Depression Brother   . Prostate cancer Brother   . Anesthesia problems Neg Hx   . Hypotension Neg Hx   . Malignant hyperthermia Neg Hx   . Pseudochol deficiency Neg Hx   . Cancer Father     head neck     Social History:  reports that she has been smoking Cigarettes.  She has a 3 pack-year smoking history. She has never used smokeless tobacco. She reports that she does not drink alcohol or use illicit drugs.   Medications Prior to Admission  Medication Sig Dispense Refill  . amLODipine (NORVASC) 5 MG tablet Take 5 mg by mouth daily.    Marland Kitchen aspirin EC 81 MG EC tablet Take 1 tablet (81 mg total) by mouth daily. 30 tablet 0  . atorvastatin (LIPITOR) 10 MG tablet Take 10 mg by mouth daily.    . budesonide-formoterol (SYMBICORT) 160-4.5 MCG/ACT inhaler Inhale 2 puffs into the lungs 2 (two) times daily as needed (for shortness of breath and wheezing).     . cholecalciferol (VITAMIN D) 1000 UNITS tablet Take 1,000 Units by mouth daily.    . cyclobenzaprine (FLEXERIL) 10 MG tablet Take 1 tablet (10 mg total) by mouth 3 (three) times daily as needed for muscle spasms. 30 tablet 1  . diazepam (VALIUM) 5 MG tablet Take 2 tablets (10 mg total) by mouth 2 (two) times daily. 120 tablet 4  . fluticasone (  FLONASE) 50 MCG/ACT nasal spray Place 2 sprays into both nostrils daily. 16 g 0  . folic acid (FOLVITE) 1 MG tablet Take 1 mg by mouth daily.    Marland Kitchen loratadine (CLARITIN) 10 MG tablet Take 1 tablet (10 mg total) by mouth daily. 30 tablet 0  . Multiple Vitamins-Minerals (MULTIVITAMIN WITH MINERALS) tablet Take 1 tablet by mouth every morning.     . nebivolol (BYSTOLIC) 10 MG tablet Take 1 tablet (10 mg total) by mouth daily. 30 tablet 1  . nortriptyline (PAMELOR) 25 MG capsule Take 2 capsules (50 mg total) by mouth at bedtime. 2  qhs 60 capsule 8  . oxyCODONE-acetaminophen (PERCOCET/ROXICET) 5-325 MG tablet Take 2 tablets by mouth every 4 (four) hours as needed for severe pain. 6 tablet 0  .  pantoprazole (PROTONIX) 40 MG tablet Take 40 mg by mouth daily before breakfast.   12  . potassium chloride SA (K-DUR,KLOR-CON) 20 MEQ tablet Take 20 mEq by mouth 2 (two) times daily.     . sucralfate (CARAFATE) 1 G tablet Take 1 tablet (1 g total) by mouth 3 (three) times daily with meals. 90 tablet 0  . triamterene-hydrochlorothiazide (DYAZIDE) 50-25 MG capsule Take 1 capsule by mouth every morning.    . valACYclovir (VALTREX) 1000 MG tablet Take 1,000 mg by mouth daily as needed (for out breaks).     . vitamin B-12 (CYANOCOBALAMIN) 1000 MCG tablet Take 1,000 mcg by mouth daily.    Marland Kitchen zolpidem (AMBIEN CR) 12.5 MG CR tablet Take 1 tablet (12.5 mg total) by mouth at bedtime as needed for sleep. 30 tablet 5  . docusate sodium (COLACE) 100 MG capsule Take 100 mg by mouth daily as needed for mild constipation.    Marland Kitchen HYDROmorphone (DILAUDID) 2 MG tablet Take 1-2 tablets (2-4 mg total) by mouth every 4 (four) hours as needed for severe pain. (Patient not taking: Reported on 02/06/2015) 50 tablet 0  . Levomilnacipran HCl ER 40 MG CP24 Take 40 mg by mouth daily. 30 capsule 5  . Linaclotide (LINZESS) 145 MCG CAPS capsule Take 145 mcg by mouth daily as needed (constipation).     . metoCLOPramide (REGLAN) 10 MG tablet Take 1 tablet (10 mg total) by mouth every 6 (six) hours as needed for nausea (nausea/headache). 6 tablet 0  . naproxen (NAPROSYN) 500 MG tablet Take 1 tablet (500 mg total) by mouth 2 (two) times daily as needed for mild pain, moderate pain or headache (TAKE WITH MEALS.). (Patient not taking: Reported on 05/17/2015) 20 tablet 0  . nicotine (NICOTROL) 10 MG inhaler Inhale 1 continuous puffing into the lungs daily as needed for smoking cessation.     . ondansetron (ZOFRAN) 4 MG tablet Take 1 tablet (4 mg total) by mouth every 8 (eight) hours as needed for nausea or vomiting. 20 tablet 1     Physical Exam: right shoulder with painful and restricted motion as noted at recent office  visits  Vitals  Temp:  [98.4 F (36.9 C)] 98.4 F (36.9 C) (02/16 1235) Pulse Rate:  [89] 89 (02/16 1235) Resp:  [20] 20 (02/16 1235) BP: (122)/(80) 122/80 mmHg (02/16 1235) SpO2:  [97 %] 97 % (02/16 1235) Weight:  [103.919 kg (229 lb 1.6 oz)] 103.919 kg (229 lb 1.6 oz) (02/16 1235)  Assessment/Plan  Impression: right shoulder displaced suture anchor  Plan of Action: Procedure(s): RIGHT SHOULDER ARTHROSCOPY WITH REMOVAL OF SUTURE ANCHOR AND POSSIBLE REVISION ROTATOR CUFF REPAIR  Gloria Lewis M Gloria Lewis 05/23/2015, 3:55 PM Contact # (  [phone]     

## 2015-05-23 NOTE — Progress Notes (Signed)
Nurse saw Dr. Onnie Graham in hallway and informed him of patients penicillin and amoxicillin allergy. Dr. Onnie Graham stated to give patient 1g of Vancomycin IV instead of Ancef, and to start infusing Vancomycin a half a hour before surgery. Dr. Onnie Graham also stated that he would have someone call Short Stay staff with instructions on when to start Vancomycin. Will inform Short Stay staff of this.

## 2015-05-23 NOTE — Anesthesia Postprocedure Evaluation (Signed)
Anesthesia Post Note  Patient: Gloria Lewis  Procedure(s) Performed: Procedure(s) (LRB): RIGHT SHOULDER ARTHROSCOPY WITH REMOVAL OF SUTURE ANCHOR AND POSSIBLE REVISION ROTATOR CUFF REPAIR (Right)  Patient location during evaluation: PACU Anesthesia Type: General Level of consciousness: awake and alert Pain management: pain level controlled Vital Signs Assessment: post-procedure vital signs reviewed and stable Respiratory status: spontaneous breathing, nonlabored ventilation, respiratory function stable and patient connected to nasal cannula oxygen Cardiovascular status: blood pressure returned to baseline and stable Postop Assessment: no signs of nausea or vomiting Anesthetic complications: no    Last Vitals:  Filed Vitals:   05/23/15 1944 05/23/15 2010  BP: 106/74 120/66  Pulse: 66 64  Temp: 36.7 C   Resp: 22 18    Last Pain:  Filed Vitals:   05/23/15 2018  PainSc: [place] DAVID

## 2015-05-24 DIAGNOSIS — M13811 Other specified arthritis, right shoulder: Secondary | ICD-10-CM | POA: Diagnosis not present

## 2015-05-24 MED ORDER — TRIAMTERENE-HCTZ 37.5-25 MG PO TABS
1.0000 | ORAL_TABLET | Freq: Every day | ORAL | Status: DC
  Filled 2015-05-24: qty 1

## 2015-05-24 NOTE — Discharge Summary (Signed)
PATIENT ID:      Gloria Lewis  MRN:     [id number] DOB/AGE:    1955/07/14 / 60 y.o.     DISCHARGE SUMMARY  ADMISSION DATE:    05/23/2015 DISCHARGE DATE:    ADMISSION DIAGNOSIS: right shoulder displaced suture anchor Past Medical History  Diagnosis Date  . HTN (hypertension)   . Bipolar 1 disorder ([place])   . OA (osteoarthritis)   . GERD (gastroesophageal reflux disease)   . Hypercholesterolemia   . Fever blister   . HA (headache)   . Colon polyp   . CTS (carpal tunnel syndrome)   . Anxiety   . Depression   . Migraines   . Schizo-affective psychosis ([place])   . Anginal pain ([place])     admit 06/2014; had non-ischemic stress test    DISCHARGE DIAGNOSIS:   Active Problems:   Surgery, elective   S/P arthroscopy of shoulder   PROCEDURE: Procedure(s): RIGHT SHOULDER ARTHROSCOPY WITH REMOVAL OF SUTURE ANCHOR AND POSSIBLE REVISION ROTATOR CUFF REPAIR on 05/23/2015  CONSULTS:     HISTORY:  See H&P in chart.  HOSPITAL COURSE:  Gloria Lewis is a 60 y.o. admitted on 05/23/2015 with a chief complaint of severe right shoulder pain with previous RTC repair, and found to have a diagnosis of right shoulder displaced suture anchor.  They were brought to the operating room on 05/23/2015 and underwent Procedure(s): RIGHT SHOULDER ARTHROSCOPY WITH REMOVAL OF SUTURE ANCHOR AND POSSIBLE REVISION ROTATOR CUFF REPAIR.    They were given perioperative antibiotics: Anti-infectives    Start     Dose/Rate Route Frequency Ordered Stop   05/24/15 0600  ceFAZolin (ANCEF) IVPB 2 g/50 mL premix  Status:  Discontinued     2 g 100 mL/hr over 30 Minutes Intravenous On call to O.R. 05/23/15 1239 05/23/15 1345   05/23/15 1345  vancomycin (VANCOCIN) IVPB 1000 mg/200 mL premix     1,000 mg 200 mL/hr over 60 Minutes Intravenous  Once 05/23/15 1344 05/23/15 1738   05/23/15 1345  vancomycin (VANCOCIN) 1 GM/200ML IVPB    Comments:  Starleen Arms   : cabinet override      05/23/15 1345 05/24/15 0159    05/23/15 1246  ceFAZolin (ANCEF) 2-3 GM-% IVPB SOLR    CommentsRonnald Ramp, [place]   : cabinet override      05/23/15 1246 05/24/15 0059    .  Patient underwent the above named procedure and tolerated it well. The following day they were hemodynamically stable and pain was controlled on oral analgesics. They were neurovascularly intact to the operative extremity. They were kept overnight for observation and pain control They were medically and orthopaedically stable for discharge on day1 .     DIAGNOSTIC STUDIES:  RECENT RADIOGRAPHIC STUDIES :  Dg Shoulder Right  04/27/2015  CLINICAL DATA:  Right shoulder pain and swelling since surgery in October of 2016. Had a rotator cuff repair in October. Limited range of motion. EXAM: RIGHT SHOULDER - 2+ VIEW COMPARISON:  None. FINDINGS: Alignment of the humeral head is normal relative to the glenoid fossa. 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 No acute-appearing fracture line or displaced fracture fragment. No significant degenerative change seen at the glenohumeral or acromioclavicular joint spaces. Soft tissues about the right shoulder are unremarkable. IMPRESSION: No acute findings. No significant degenerative change. Humeral head is normally positioned. Electronically Signed   By: Roxy Horseman.D.  On: 04/27/2015 10:47    RECENT VITAL SIGNS:  Patient Vitals for the past 24 hrs:  BP Temp Temp src Pulse Resp SpO2 Height Weight  05/24/15 0433 115/77 mmHg 98.1 F (36.7 C) Oral 80 16 94 % - -  05/24/15 0108 119/80 mmHg 98.9 F (37.2 C) Oral 72 18 96 % - -  05/23/15 2010 120/66 mmHg - - 64 18 93 % - -  05/23/15 1944 106/74 mmHg 98 F (36.7 C) - 66 (!) 22 94 % - -  05/23/15 1934 - 98 F (36.7 C) - 67 20 93 % - -  05/23/15 1930 111/69 mmHg - - 60 (!) 22 93 % - -  05/23/15 1915 102/70 mmHg - - 67 20 93 % - -  05/23/15 1900 114/68 mmHg - - 70 (!) 21 94 % - -   05/23/15 1845 107/72 mmHg - - 71 20 94 % - -  05/23/15 1830 107/68 mmHg - - - - - - -  05/23/15 1628 127/67 mmHg - - 69 20 100 % - -  05/23/15 1625 108/65 mmHg - - 72 20 100 % - -  05/23/15 1623 108/65 mmHg - - 69 19 100 % - -  05/23/15 1618 123/71 mmHg - - 70 19 97 % - -  05/23/15 1610 122/64 mmHg - - 71 18 99 % - -  05/23/15 1605 (!) 130/58 mmHg - - 70 20 98 % - -  05/23/15 1600 117/60 mmHg - - 71 (!) 21 99 % - -  05/23/15 1555 (!) 129/56 mmHg - - 69 (!) 21 99 % - -  05/23/15 1235 122/80 mmHg 98.4 F (36.9 C) Oral 89 20 97 % 5\' 7"  (1.702 m) 103.919 kg (229 lb 1.6 oz)  .  RECENT EKG RESULTS:    Orders placed or performed during the hospital encounter of 04/28/15  . EKG 12-Lead  . EKG 12-Lead  . EKG    DISCHARGE INSTRUCTIONS:  Discharge Instructions    Change dressing    Complete by:  As directed            DISCHARGE MEDICATIONS:     Medication List    STOP taking these medications        cyclobenzaprine 10 MG tablet  Commonly known as:  FLEXERIL     HYDROmorphone 2 MG tablet  Commonly known as:  DILAUDID     oxyCODONE-acetaminophen 5-325 MG tablet  Commonly known as:  PERCOCET/ROXICET      TAKE these medications        amLODipine 5 MG tablet  Commonly known as:  NORVASC  Take 5 mg by mouth daily.     aspirin 81 MG EC tablet  Take 1 tablet (81 mg total) by mouth daily.     atorvastatin 10 MG tablet  Commonly known as:  LIPITOR  Take 10 mg by mouth daily.     budesonide-formoterol 160-4.5 MCG/ACT inhaler  Commonly known as:  SYMBICORT  Inhale 2 puffs into the lungs 2 (two) times daily as needed (for shortness of breath and wheezing).     cholecalciferol 1000 units tablet  Commonly known as:  VITAMIN D  Take 1,000 Units by mouth daily.     diazepam 5 MG tablet  Commonly known as:  VALIUM  Take 2 tablets (10 mg total) by mouth 2 (two) times daily.     docusate sodium 100 MG capsule  Commonly known as:  COLACE  Take 100 mg by mouth  daily as  needed for mild constipation.     fluticasone 50 MCG/ACT nasal spray  Commonly known as:  FLONASE  Place 2 sprays into both nostrils daily.     folic acid 1 MG tablet  Commonly known as:  FOLVITE  Take 1 mg by mouth daily.     Levomilnacipran HCl ER 40 MG Cp24  Take 40 mg by mouth daily.     LINZESS 145 MCG Caps capsule  Generic drug:  Linaclotide  Take 145 mcg by mouth daily as needed (constipation).     loratadine 10 MG tablet  Commonly known as:  CLARITIN  Take 1 tablet (10 mg total) by mouth daily.     metoCLOPramide 10 MG tablet  Commonly known as:  REGLAN  Take 1 tablet (10 mg total) by mouth every 6 (six) hours as needed for nausea (nausea/headache).     multivitamin with minerals tablet  Take 1 tablet by mouth every morning.     naproxen 500 MG tablet  Commonly known as:  NAPROSYN  Take 1 tablet (500 mg total) by mouth 2 (two) times daily as needed for mild pain, moderate pain or headache (TAKE WITH MEALS.).     nebivolol 10 MG tablet  Commonly known as:  BYSTOLIC  Take 1 tablet (10 mg total) by mouth daily.     nicotine 10 MG inhaler  Commonly known as:  NICOTROL  Inhale 1 continuous puffing into the lungs daily as needed for smoking cessation.     nortriptyline 25 MG capsule  Commonly known as:  PAMELOR  Take 2 capsules (50 mg total) by mouth at bedtime. 2  qhs     ondansetron 4 MG tablet  Commonly known as:  ZOFRAN  Take 1 tablet (4 mg total) by mouth every 8 (eight) hours as needed for nausea or vomiting.     ondansetron 4 MG tablet  Commonly known as:  ZOFRAN  Take 1 tablet (4 mg total) by mouth every 8 (eight) hours as needed for nausea or vomiting.     Oxycodone HCl 10 MG Tabs  Take 0.5-1 tablets (5-10 mg total) by mouth every 4 (four) hours as needed.     pantoprazole 40 MG tablet  Commonly known as:  PROTONIX  Take 40 mg by mouth daily before breakfast.     potassium chloride SA 20 MEQ tablet  Commonly known as:  K-DUR,KLOR-CON  Take 20  mEq by mouth 2 (two) times daily.     sucralfate 1 g tablet  Commonly known as:  CARAFATE  Take 1 tablet (1 g total) by mouth 3 (three) times daily with meals.     triamterene-hydrochlorothiazide 50-25 MG capsule  Commonly known as:  DYAZIDE  Take 1 capsule by mouth every morning.     valACYclovir 1000 MG tablet  Commonly known as:  VALTREX  Take 1,000 mg by mouth daily as needed (for out breaks).     vitamin B-12 1000 MCG tablet  Commonly known as:  CYANOCOBALAMIN  Take 1,000 mcg by mouth daily.     zolpidem 12.5 MG CR tablet  Commonly known as:  AMBIEN CR  Take 1 tablet (12.5 mg total) by mouth at bedtime as needed for sleep.        FOLLOW UP VISIT:       Follow-up Information    Follow up with Metta Clines SUPPLE, MD.   Specialty:  Orthopedic Surgery   Why:  call to be seen in 7-10 days  Contact information:   [address] [place] [address] [id number]       DISCHARGE TO: Home  DISPOSITION: Good  DISCHARGE CONDITION:  Festus Barren for Dr. Justice Britain 05/24/2015, 7:53 AM

## 2015-05-24 NOTE — Progress Notes (Signed)
Gloria Lewis discharged home per MD order. Discharge instructions reviewed and discussed with patient. All questions and concerns answered. Copy of instructions and scripts given to patient. IV removed.  Patient escorted to car by staff in a wheelchair. No distress noted upon discharge.   Tarri Abernethy R 05/24/2015 11:34 AM

## 2015-05-27 ENCOUNTER — Encounter (HOSPITAL_COMMUNITY): Payer: Self-pay | Admitting: Orthopedic Surgery

## 2015-07-22 ENCOUNTER — Encounter: Payer: Self-pay | Admitting: Allergy and Immunology

## 2015-07-22 ENCOUNTER — Ambulatory Visit (INDEPENDENT_AMBULATORY_CARE_PROVIDER_SITE_OTHER): Payer: Medicaid Other | Admitting: Allergy and Immunology

## 2015-07-22 VITALS — BP 150/95 | HR 70 | Temp 98.0°F | Resp 18 | Ht 66.93 in | Wt 226.4 lb

## 2015-07-22 DIAGNOSIS — J310 Chronic rhinitis: Secondary | ICD-10-CM | POA: Insufficient documentation

## 2015-07-22 DIAGNOSIS — Z720 Tobacco use: Secondary | ICD-10-CM

## 2015-07-22 DIAGNOSIS — J449 Chronic obstructive pulmonary disease, unspecified: Secondary | ICD-10-CM

## 2015-07-22 MED ORDER — AZELASTINE HCL 0.15 % NA SOLN: 1.0000 | Freq: Two times a day (BID) | NASAL | Status: DC

## 2015-07-22 NOTE — Progress Notes (Signed)
New Patient Note  RE: ROHAN COSLETT MRN: [id number] DOB: 1955/10/05 Date of Office Visit: 07/22/2015  Referring provider: Harlan Stains, MD Primary care provider: Vidal Schwalbe, MD  Chief Complaint: Nasal Congestion; Headache; and Shortness of Breath   History of present illness: HPI Comments: Braylon Gille is a 60 y.o. female presenting today for consultation of rhinitis and wheezing.  Over the past year she has experienced nasal congestion, rhinorrhea, thick postnasal drainage, watery/puffy eyes, and sinus pressure over the cheekbones "all the time."  These symptoms occur year around and tend to be worse when she is in her home.  She believes that her symptoms may be related to mold or other substances in her home's drainage system.  She also reports that she wheezes "really bad at night" along with occasional dyspnea and chest tightness.  She currently takes Symbicort 160/4.5 g as needed.  She has smoked cigarettes and she was 60 years old and claims to smoke 3 cigarettes per day currently.  She has a history of migraine headaches.   Assessment and plan: Chronic rhinitis Non-allergic rhinitis.  All seasonal and perennial aeroallergen skin tests are negative despite a positive histamine control.  Intranasal steroids and intranasal antihistamines are effective for symptoms associated with non-allergic rhinitis, whereas second generation antihistamines such as cetirizine, loratadine and fexofenadine have been found to be ineffective for this condition.  A prescription has been provided for azelastine nasal spray, one spray per nostril 2 times daily as needed. Proper nasal spray technique has been discussed and demonstrated.  If needed, Birtie may add fluticasone nasal spray.  Nasal saline lavage (NeilMed) as needed has been recommended along with instructions for proper administration.  COPD  Continue Symbicort 160/4.5.  I have recommended 2 inhalations twice a day scheduled  dosing rather than as needed dosing.  To maximize pulmonary deposition, a spacer has been provided along with instructions for its proper administration with an HFA inhaler.   Tobacco abuse  Tobacco cessation has been discussed and encouraged.    Meds ordered this encounter  Medications  . Azelastine HCl 0.15 % SOLN    Sig: Place 1 spray into both nostrils 2 (two) times daily.    Dispense:  30 mL    Refill:  5    Diagnositics: Spirometry: FVC was 2.24 L and FEV1 was 1.69 L (74% predicted) without significant postbronchodilator improvement.  Please see scanned spirometry results for details. Epicutaneous testing: Negative despite a positive histamine control. Intradermal testing: Negative despite a positive histamine control.    Physical examination: Blood pressure 150/95, pulse 70, temperature 98 F (36.7 C), temperature source Oral, resp. rate 18, height 5' 6.93" (1.7 m), weight 226 lb 6.6 oz (102.7 kg).  General: Alert, interactive, in no acute distress. HEENT: TMs pearly gray, turbinates edematous without discharge, post-pharynx erythematous. Neck: Supple without lymphadenopathy. Lungs: Clear to auscultation without wheezing, rhonchi or rales. CV: Normal S1, S2 without murmurs. Abdomen: Nondistended, nontender. Skin: Warm and dry, without lesions or rashes. Extremities:  No clubbing, cyanosis or edema. Neuro:   Grossly intact.  Review of systems:  Review of Systems  Constitutional: Negative for fever, chills and weight loss.  HENT: Positive for congestion. Negative for nosebleeds.   Eyes: Negative for blurred vision.  Respiratory: Positive for cough, shortness of breath and wheezing. Negative for hemoptysis.   Cardiovascular: Negative for chest pain.  Gastrointestinal: Negative for diarrhea and constipation.  Genitourinary: Negative for dysuria.  Musculoskeletal: Negative for myalgias and joint pain.  Skin:  Negative for itching and rash.  Neurological: Positive for  headaches. Negative for dizziness.  Endo/Heme/Allergies: Does not bruise/bleed easily.    Past medical history:  Past Medical History  Diagnosis Date  . HTN (hypertension)   . Bipolar 1 disorder ([place])   . OA (osteoarthritis)   . GERD (gastroesophageal reflux disease)   . Hypercholesterolemia   . Fever blister   . HA (headache)   . Colon polyp   . CTS (carpal tunnel syndrome)   . Anxiety   . Depression   . Migraines   . Schizo-affective psychosis ([place])   . Anginal pain ([place])     admit 06/2014; had non-ischemic stress test    Past surgical history:  Past Surgical History  Procedure Laterality Date  . Neck surgery  2009  . Carpal tunnel release  20110 rt/lt  . Polp removed  2011  . Back injection    . 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 Anterior cervical decomp/discectomy fusion  08/27/2011    Procedure: ANTERIOR CERVICAL DECOMPRESSION/DISCECTOMY FUSION 1 LEVEL/HARDWARE REMOVAL;  Surgeon: Eustace Moore, MD;  Location: [facility];  Service: Neurosurgery;  Laterality: Bilateral;  Cervical four-five Anterior cervical decompression/diskectomy, fusion, Plate, Removal of Cervical five-seven Plate  . Hemorrhoid surgery    . Shoulder arthroscopy with subacromial decompression Right 01/24/2015    Procedure: RIGHT SHOULDER ARTHROSCOPY WITH SUBACROMIAL DECOMPRESSION AD DISTAL CLAVICLE RESECTION ;  Surgeon: Justice Britain, MD;  Location: [facility];  Service: Orthopedics;  Laterality: Right;  . Cardiac catheterization N/A 11/09/2014    Procedure: Right Heart Cath;  Surgeon: Larey Dresser, MD;  Location: [facility];  Service: Cardiovascular;  Laterality: N/A;  . Multiple tooth extractions    . Vaginal delivery      x3  . Shoulder arthroscopy with rotator cuff repair Right 05/23/2015    Procedure: RIGHT SHOULDER ARTHROSCOPY WITH REMOVAL OF SUTURE ANCHOR AND POSSIBLE REVISION ROTATOR CUFF REPAIR;  Surgeon: Justice Britain, MD;  Location: [facility];  Service:  Orthopedics;  Laterality: Right;    Family history: Family History  Problem Relation Age of Onset  . Coronary artery disease Father   . Cancer Father     head neck   . Hypertension Mother   . Schizophrenia Mother   . Depression Brother   . Prostate cancer Brother   . Anesthesia problems Neg Hx   . Hypotension Neg Hx   . Malignant hyperthermia Neg Hx   . Pseudochol deficiency Neg Hx   . Allergic rhinitis Neg Hx   . Angioedema Neg Hx   . Asthma Neg Hx   . Atopy Neg Hx   . Eczema Neg Hx   . Immunodeficiency Neg Hx   . Urticaria Neg Hx     Social history: Social History   Social History  . Marital Status: Single    Spouse Name: N/A  . Number of Children: N/A  . Years of Education: N/A   Occupational History  . Not on file.   Social History Main Topics  . Smoking status: Current Some Day Smoker -- 0.10 packs/day for 30 years    Types: Cigarettes  . Smokeless tobacco: Never Used     Comment: using nicotrol inhaler  . Alcohol Use: No  . Drug Use: No  . Sexual Activity: No   Other Topics Concern  . Not on file   Social History Narrative   Environmental History: The patient lives  in a 59 year old house with carpeting throughout, central air and heat.  There are no pets in the home.  She is a smoker with a 25-pack-year history.    Medication List       This list is accurate as of: 07/22/15 12:34 PM.  Always use your most recent med list.               amLODipine 5 MG tablet  Commonly known as:  NORVASC  Take 5 mg by mouth daily.     aspirin 81 MG EC tablet  Take 1 tablet (81 mg total) by mouth daily.     atorvastatin 10 MG tablet  Commonly known as:  LIPITOR  Take 10 mg by mouth daily.     Azelastine HCl 0.15 % Soln  Place 1 spray into both nostrils 2 (two) times daily.     budesonide-formoterol 160-4.5 MCG/ACT inhaler  Commonly known as:  SYMBICORT  Inhale 2 puffs into the lungs 2 (two) times daily as needed (for shortness of breath and wheezing).      cholecalciferol 1000 units tablet  Commonly known as:  VITAMIN D  Take 1,000 Units by mouth daily.     diazepam 5 MG tablet  Commonly known as:  VALIUM  Take 2 tablets (10 mg total) by mouth 2 (two) times daily.     docusate sodium 100 MG capsule  Commonly known as:  COLACE  Take 100 mg by mouth daily as needed for mild constipation. Reported on 07/22/2015     fluticasone 50 MCG/ACT nasal spray  Commonly known as:  FLONASE  Place 2 sprays into both nostrils daily.     folic acid 1 MG tablet  Commonly known as:  FOLVITE  Take 1 mg by mouth daily.     Levomilnacipran HCl ER 40 MG Cp24  Take 40 mg by mouth daily.     LINZESS 145 MCG Caps capsule  Generic drug:  linaclotide  Take 145 mcg by mouth daily as needed (constipation).     loratadine 10 MG tablet  Commonly known as:  CLARITIN  Take 1 tablet (10 mg total) by mouth daily.     metoCLOPramide 10 MG tablet  Commonly known as:  REGLAN  Take 1 tablet (10 mg total) by mouth every 6 (six) hours as needed for nausea (nausea/headache).     multivitamin with minerals tablet  Take 1 tablet by mouth every morning.     naproxen 500 MG tablet  Commonly known as:  NAPROSYN  Take 1 tablet (500 mg total) by mouth 2 (two) times daily as needed for mild pain, moderate pain or headache (TAKE WITH MEALS.).     nebivolol 10 MG tablet  Commonly known as:  BYSTOLIC  Take 1 tablet (10 mg total) by mouth daily.     nicotine 10 MG inhaler  Commonly known as:  NICOTROL  Inhale 1 continuous puffing into the lungs daily as needed for smoking cessation.     nortriptyline 25 MG capsule  Commonly known as:  PAMELOR  Take 2 capsules (50 mg total) by mouth at bedtime. 2  qhs     ondansetron 4 MG tablet  Commonly known as:  ZOFRAN  Take 1 tablet (4 mg total) by mouth every 8 (eight) hours as needed for nausea or vomiting.     ondansetron 4 MG tablet  Commonly known as:  ZOFRAN  Take 1 tablet (4 mg total) by mouth every 8 (eight) hours  as needed  for nausea or vomiting.     Oxycodone HCl 10 MG Tabs  Take 0.5-1 tablets (5-10 mg total) by mouth every 4 (four) hours as needed.     oxyCODONE-acetaminophen 5-325 MG tablet  Commonly known as:  PERCOCET/ROXICET  Take 1 tablet by mouth 2 (two) times daily as needed. Reported on 07/22/2015     pantoprazole 40 MG tablet  Commonly known as:  PROTONIX  Take 40 mg by mouth daily before breakfast.     potassium chloride SA 20 MEQ tablet  Commonly known as:  K-DUR,KLOR-CON  Take 20 mEq by mouth 2 (two) times daily.     sucralfate 1 g tablet  Commonly known as:  CARAFATE  Take 1 tablet (1 g total) by mouth 3 (three) times daily with meals.     triamterene-hydrochlorothiazide 50-25 MG capsule  Commonly known as:  DYAZIDE  Take 1 capsule by mouth every morning.     valACYclovir 1000 MG tablet  Commonly known as:  VALTREX  Take 1,000 mg by mouth daily as needed (for out breaks).     vitamin B-12 1000 MCG tablet  Commonly known as:  CYANOCOBALAMIN  Take 1,000 mcg by mouth daily.     zolpidem 12.5 MG CR tablet  Commonly known as:  AMBIEN CR  Take 1 tablet (12.5 mg total) by mouth at bedtime as needed for sleep.        Known medication allergies: Allergies  Allergen Reactions  . Aspirin Hives and Itching  . Effexor [Venlafaxine Hydrochloride] Itching and Other (See Comments)    headache  . Latex Itching and Rash  . Penicillins Hives    Has patient had a PCN reaction causing immediate rash, facial/tongue/throat swelling, SOB or lightheadedness with hypotension: Yes Has patient had a PCN reaction causing severe rash involving mucus membranes or skin necrosis: No Has patient had a PCN reaction that required hospitalization No Has patient had a PCN reaction occurring within the last 10 years: No If all of the above answers are "NO", then may proceed with Cephalosporin use.   . Zithromax [Azithromycin Dihydrate] Swelling  . Amoxicillin Itching  . Chantix [Varenicline  Tartrate] Nausea Only  . Hydrocodone Other (See Comments)    headache  . Norco [Hydrocodone-Acetaminophen] Itching  . Paroxetine Hcl Other (See Comments)    headache  . Tramadol Other (See Comments)    Pt states it interacted with her sertraline, but she is no longer on sertraline.  She does not remember the type of reaction she had.     I appreciate the opportunity to take part in this Zahraa's care. Please do not hesitate to contact me with questions.  Sincerely,   R. Edgar Frisk, MD

## 2015-07-22 NOTE — Assessment & Plan Note (Addendum)
   Continue Symbicort 160/4.5.  I have recommended 2 inhalations twice a day scheduled dosing rather than as needed dosing.  To maximize pulmonary deposition, a spacer has been provided along with instructions for its proper administration with an HFA inhaler.

## 2015-07-22 NOTE — Assessment & Plan Note (Addendum)
Non-allergic rhinitis.  All seasonal and perennial aeroallergen skin tests are negative despite a positive histamine control.  Intranasal steroids and intranasal antihistamines are effective for symptoms associated with non-allergic rhinitis, whereas second generation antihistamines such as cetirizine, loratadine and fexofenadine have been found to be ineffective for this condition.  A prescription has been provided for azelastine nasal spray, one spray per nostril 2 times daily as needed. Proper nasal spray technique has been discussed and demonstrated.  If needed, Gloria Lewis may add fluticasone nasal spray.  Nasal saline lavage (NeilMed) as needed has been recommended along with instructions for proper administration.

## 2015-07-22 NOTE — Patient Instructions (Addendum)
Chronic rhinitis Non-allergic rhinitis.  All seasonal and perennial aeroallergen skin tests are negative despite a positive histamine control.  Intranasal steroids and intranasal antihistamines are effective for symptoms associated with non-allergic rhinitis, whereas second generation antihistamines such as cetirizine, loratadine and fexofenadine have been found to be ineffective for this condition.  A prescription has been provided for azelastine nasal spray, one spray per nostril 2 times daily as needed. Proper nasal spray technique has been discussed and demonstrated.  If needed, Gloria Lewis may add fluticasone nasal spray.  Nasal saline lavage (NeilMed) as needed has been recommended along with instructions for proper administration.  COPD  Continue Symbicort 160/4.5.  I have recommended 2 inhalations twice a day scheduled dosing rather than as needed dosing.  To maximize pulmonary deposition, a spacer has been provided along with instructions for its proper administration with an HFA inhaler.   Tobacco abuse  Tobacco cessation has been discussed and encouraged.    Return in about 6 months (around 01/21/2016), or if symptoms worsen or fail to improve.

## 2015-07-22 NOTE — Assessment & Plan Note (Signed)
   Tobacco cessation has been discussed and encouraged. 

## 2015-07-26 ENCOUNTER — Encounter (HOSPITAL_COMMUNITY): Payer: Self-pay | Admitting: Psychiatry

## 2015-07-26 ENCOUNTER — Ambulatory Visit (INDEPENDENT_AMBULATORY_CARE_PROVIDER_SITE_OTHER): Payer: Self-pay | Admitting: Psychiatry

## 2015-07-26 VITALS — BP 116/76 | HR 100 | Ht 67.0 in | Wt 224.0 lb

## 2015-07-26 DIAGNOSIS — F332 Major depressive disorder, recurrent severe without psychotic features: Secondary | ICD-10-CM

## 2015-07-26 DIAGNOSIS — F330 Major depressive disorder, recurrent, mild: Secondary | ICD-10-CM

## 2015-07-26 MED ORDER — DIAZEPAM 5 MG PO TABS: 10.0000 mg | ORAL_TABLET | Freq: Two times a day (BID) | ORAL | Status: DC

## 2015-07-26 MED ORDER — NORTRIPTYLINE HCL 25 MG PO CAPS: 50.0000 mg | ORAL_CAPSULE | Freq: Every day | ORAL | Status: DC

## 2015-07-26 MED ORDER — LEVOMILNACIPRAN HCL ER 40 MG PO CP24: 40.0000 mg | ORAL_CAPSULE | Freq: Every day | ORAL | Status: DC

## 2015-07-26 NOTE — Progress Notes (Signed)
Patient ID: Gloria Lewis, female   DOB: 03-Mar-1956, 60 y.o.   MRN: [id number] [facility] MD Progress Note  07/26/2015 9:09 AM Gloria Lewis  MRN:  [id number] Subjective:  Shoulder hurting Principal Problem: Major Depression,recurent Mild Diagnosis: Major Depression, Recurent Today the patient is somewhat frustrated in regards to her sons. She has one son who is chronically mentally ill and presently is homeless. She's not sure if he's getting treatment. Her other son is been living with her for 2 months even though he's only been supposed to live with her for 2 weeks. He he is fully employed at [employer]. He is using marijuana and getting high on a regular basis. Instead of getting his mother money for food is getting high all the time. The patient is made a decision in the last few days that she's going to insist to leave. Is a hard decision for her. It is noted that she also has a daughter is also very drug addicted. This patient has had a past substance abuse problem. 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 Is been challenging for her because she has right shoulder pain and just saw her surgeon again for another procedure. She has chronic right shoulder pain and he actually gives her some Percocet to take once a day. She's needed less and less. It is not led to an exacerbation of any substance issues. It is noted that I give her nortriptyline for depression but likely has a benefit for her pain. Nonetheless the surgeon did not recommend physical therapy and the patient did try some water therapy but it did not work. The patient is resolved to simply wait for it to heal. The patient therefore has chronic mild pain. This patient denies daily depression. She is actually sleeping and eating well. She's got good energy. She still think and concentrate. She loves to read and she is a witness for community to talk to people about substance abuse and avoiding drugs. She is very proactive. She's to courses away from getting  a psychology degree from [employer] She wants to work with a homeless. The patient enjoys herself enjoys life likes to read and really is stable. She denies the use of any substances at this time. She still goes to AA a few times a month. She's very invested in her therapist she sees regularly, Gloria Lewis. The patient denies suicidal thinking. She has no neurological complaints. She denies chest pain or shortness of breath. She is in good spirits. She remains optimistic. Patient Active Problem List   Diagnosis Date Noted  . Chronic rhinitis [J31.0] 07/22/2015  . Surgery, elective [Z41.9] 05/23/2015  . S/P arthroscopy of shoulder [Z98.890] 05/23/2015  . Chronic migraine without aura without status migrainosus, not intractable [G43.709] 10/18/2014  . Tobacco abuse [Z72.0] 10/18/2014  . Obesity [E66.9] 09/23/2014  . COPD [J44.9] 09/02/2014  . Pulmonary hypertension ([place]) [I27.2] 08/31/2014  . Respiratory failure with hypoxia ([place]) [J96.91] 08/14/2014  . Cigarette smoker [Z72.0] 07/28/2014  . Major depressive disorder, recurrent episode, moderate ([place]) [F33.1] 07/06/2014  . Essential hypertension [I10]   . SOB (shortness of breath) [R06.02] 06/21/2014  . Precordial pain [R07.2] 06/21/2014  . GERD (gastroesophageal reflux disease) [K21.9] 06/21/2014  . Chest pain [R07.9] 06/21/2014  . HTN (hypertension) [I10]   . Neck pain [M54.2] 01/08/2014  . Major depressive disorder, recurrent episode, severe, without mention of psychotic behavior [F33.2] 10/14/2012  . Schizoaffective disorder ([place]) [F25.9] 05/26/2012  . 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 Hyperparathyroidism,  primary ([place]) [E21.0] 10/06/2010  . DEGENERATIVE DISC DISEASE, LUMBOSACRAL SPINE [M51.37] 05/21/2007  . DERMATOPHYTOSIS OF THE BODY [B35.4] 05/10/2007  . 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 Anxiety state [F41.1] 03/24/2007  . DENTAL PAIN [K08.9] 03/24/2007  . SHOULDER PAIN, LEFT [M25.519]  03/24/2007   Total Time spent with patient:30 min  Past Psychiatric History:   Past Medical History:  Past Medical History  Diagnosis Date  . HTN (hypertension)   . Bipolar 1 disorder ([place])   . OA (osteoarthritis)   . GERD (gastroesophageal reflux disease)   . Hypercholesterolemia   . Fever blister   . HA (headache)   . Colon polyp   . CTS (carpal tunnel syndrome)   . Anxiety   . Depression   . Migraines   . Schizo-affective psychosis ([place])   . Anginal pain ([place])     admit 06/2014; had non-ischemic stress test    Past Surgical History  Procedure Laterality Date  . Neck surgery  2009  . Carpal tunnel release  20110 rt/lt  . Polp removed  2011  . Back injection    . 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 Anterior cervical decomp/discectomy fusion  08/27/2011    Procedure: ANTERIOR CERVICAL DECOMPRESSION/DISCECTOMY FUSION 1 LEVEL/HARDWARE REMOVAL;  Surgeon: Eustace Moore, MD;  Location: [place] [facility];  Service: Neurosurgery;  Laterality: Bilateral;  Cervical four-five Anterior cervical decompression/diskectomy, fusion, Plate, Removal of Cervical five-seven Plate  . Hemorrhoid surgery    . Shoulder arthroscopy with subacromial decompression Right 01/24/2015    Procedure: RIGHT SHOULDER ARTHROSCOPY WITH SUBACROMIAL DECOMPRESSION AD DISTAL CLAVICLE RESECTION ;  Surgeon: Justice Britain, MD;  Location: [place];  Service: Orthopedics;  Laterality: Right;  . Cardiac catheterization N/A 11/09/2014    Procedure: Right Heart Cath;  Surgeon: Larey Dresser, MD;  Location: [place] [facility];  Service: Cardiovascular;  Laterality: N/A;  . Multiple tooth extractions    . Vaginal delivery      x3  . Shoulder arthroscopy with rotator cuff repair Right 05/23/2015    Procedure: RIGHT SHOULDER ARTHROSCOPY WITH REMOVAL OF SUTURE ANCHOR AND POSSIBLE REVISION ROTATOR CUFF REPAIR;  Surgeon: Justice Britain, MD;  Location: [place];  Service: Orthopedics;  Laterality: Right;    Family History:  Family History  Problem Relation Age of Onset  . Coronary artery disease Father   . Cancer Father     head neck   . Hypertension Mother   . Schizophrenia Mother   . Depression Brother   . Prostate cancer Brother   . Anesthesia problems Neg Hx   . Hypotension Neg Hx   . Malignant hyperthermia Neg Hx   . Pseudochol deficiency Neg Hx   . Allergic rhinitis Neg Hx   . Angioedema Neg Hx   . Asthma Neg Hx   . Atopy Neg Hx   . Eczema Neg Hx   . Immunodeficiency Neg Hx   . Urticaria Neg Hx    Family Psychiatric  History:  Social History:  History  Alcohol Use No     History  Drug Use No    Social History   Social History  . Marital Status: Single    Spouse Name: N/A  . Number of Children: N/A  . Years of Education: N/A   Social History Main Topics  . Smoking status: Current Some Day Smoker -- 0.10 packs/day for 30 years    Types: Cigarettes  .  Smokeless tobacco: Never Used     Comment: using nicotrol inhaler  . Alcohol Use: No  . Drug Use: No  . Sexual Activity: No   Other Topics Concern  . None   Social History Narrative   Additional Social History:                         Sleep: Good  Appetite:  Fair  Current Medications: Current Outpatient Prescriptions  Medication Sig Dispense Refill  . amLODipine (NORVASC) 5 MG tablet Take 5 mg by mouth daily.    Marland Kitchen aspirin EC 81 MG EC tablet Take 1 tablet (81 mg total) by mouth daily. 30 tablet 0  . atorvastatin (LIPITOR) 10 MG tablet Take 10 mg by mouth daily.    . Azelastine HCl 0.15 % SOLN Place 1 spray into both nostrils 2 (two) times daily. 30 mL 5  . budesonide-formoterol (SYMBICORT) 160-4.5 MCG/ACT inhaler Inhale 2 puffs into the lungs 2 (two) times daily as needed (for shortness of breath and wheezing).     . cholecalciferol (VITAMIN D) 1000 UNITS tablet Take 1,000 Units by mouth daily.    . diazepam (VALIUM) 5 MG tablet Take 2 tablets (10 mg total) by mouth 2 (two) times daily.  120 tablet 4  . docusate sodium (COLACE) 100 MG capsule Take 100 mg by mouth daily as needed for mild constipation. Reported on 07/22/2015    . fluticasone (FLONASE) 50 MCG/ACT nasal spray Place 2 sprays into both nostrils daily. 16 g 0  . folic acid (FOLVITE) 1 MG tablet Take 1 mg by mouth daily.    . Levomilnacipran HCl ER 40 MG CP24 Take 40 mg by mouth daily. 30 capsule 5  . Linaclotide (LINZESS) 145 MCG CAPS capsule Take 145 mcg by mouth daily as needed (constipation).     Marland Kitchen loratadine (CLARITIN) 10 MG tablet Take 1 tablet (10 mg total) by mouth daily. 30 tablet 0  . metoCLOPramide (REGLAN) 10 MG tablet Take 1 tablet (10 mg total) by mouth every 6 (six) hours as needed for nausea (nausea/headache). (Patient not taking: Reported on 07/22/2015) 6 tablet 0  . Multiple Vitamins-Minerals (MULTIVITAMIN WITH MINERALS) tablet Take 1 tablet by mouth every morning.     . naproxen (NAPROSYN) 500 MG tablet Take 1 tablet (500 mg total) by mouth 2 (two) times daily as needed for mild pain, moderate pain or headache (TAKE WITH MEALS.). (Patient not taking: Reported on 05/17/2015) 20 tablet 0  . nebivolol (BYSTOLIC) 10 MG tablet Take 1 tablet (10 mg total) by mouth daily. 30 tablet 1  . nicotine (NICOTROL) 10 MG inhaler Inhale 1 continuous puffing into the lungs daily as needed for smoking cessation.     . nortriptyline (PAMELOR) 25 MG capsule Take 2 capsules (50 mg total) by mouth at bedtime. 2  qhs 60 capsule 8  . ondansetron (ZOFRAN) 4 MG tablet Take 1 tablet (4 mg total) by mouth every 8 (eight) hours as needed for nausea or vomiting. (Patient not taking: Reported on 07/22/2015) 20 tablet 1  . ondansetron (ZOFRAN) 4 MG tablet Take 1 tablet (4 mg total) by mouth every 8 (eight) hours as needed for nausea or vomiting. (Patient not taking: Reported on 07/22/2015) 20 tablet 0  . Oxycodone HCl 10 MG TABS Take 0.5-1 tablets (5-10 mg total) by mouth every 4 (four) hours as needed. 50 tablet 0  . oxyCODONE-acetaminophen  (PERCOCET/ROXICET) 5-325 MG tablet Take 1 tablet by mouth 2 (two)  times daily as needed. Reported on 07/22/2015  0  . pantoprazole (PROTONIX) 40 MG tablet Take 40 mg by mouth daily before breakfast.   12  . potassium chloride SA (K-DUR,KLOR-CON) 20 MEQ tablet Take 20 mEq by mouth 2 (two) times daily.     . sucralfate (CARAFATE) 1 G tablet Take 1 tablet (1 g total) by mouth 3 (three) times daily with meals. 90 tablet 0  . triamterene-hydrochlorothiazide (DYAZIDE) 50-25 MG capsule Take 1 capsule by mouth every morning.    . valACYclovir (VALTREX) 1000 MG tablet Take 1,000 mg by mouth daily as needed (for out breaks).     . vitamin B-12 (CYANOCOBALAMIN) 1000 MCG tablet Take 1,000 mcg by mouth daily.    Marland Kitchen zolpidem (AMBIEN CR) 12.5 MG CR tablet Take 1 tablet (12.5 mg total) by mouth at bedtime as needed for sleep. (Patient not taking: Reported on 07/22/2015) 30 tablet 5   No current facility-administered medications for this visit.    Lab Results: No results found for this or any previous visit (from the past 48 hour(s)).  Physical Findings: AIMS:  , ,  ,  ,    CIWA:    COWS:     Musculoskeletal: Strength & Muscle Tone: within normal limits Gait & Station: normal Patient leans: N/A  Psychiatric Specialty Exam: ROS  Blood pressure 116/76, pulse 100, height 5\' 7"  (1.702 m), weight 224 lb (101.606 kg).Body mass index is 35.08 kg/(m^2).  General Appearance: Casual  Eye Contact::  Good  Speech:  Clear and Coherent  Volume:  Normal  Mood:  Euthymic  Affect:  Congruent  Thought Process:  Coherent  Orientation:  Full (Time, Place, and Person)  Thought Content:  WDL  Suicidal Thoughts:  No  Homicidal Thoughts:  No  Memory:  NA  Judgement:  Good  Insight:  Fair  Psychomotor Activity:  Normal  Concentration:  Fair  Recall:  Good  Fund of Knowledge:Good  Language: Good  Akathisia:  No  Handed:  Right  AIMS (if indicated):     Assets:   ADL's:  Intact  Cognition: WNL  Sleep:        Treatment Plan Summary: At this time the patient is doing fairly well. Her depression is fairly controlled. Her #1 problem is therefore depression and she takes Fetzima 40 mg as well as nortriptyline 50 mg for this condition. The patient also takes Valium 5 mg 2 twice a day and she's been on this for over 5 years. There is no evidence of abuse or misuse. The patient was co-instructed that if her children were to take some of her Valium with have to and its use. The patient is very stable taking the Valium. Her second problem therefore is situational anxiety. This is well-controlled. Her third problem is chronic shoulder pain. She sees a Psychologist, sport and exercise regularly and does whatever he recommends. It is my hope that the nortriptyline also helps reduce her pain. Patient's wishes to be well enough to return to finish college. This patient she'll return to see me in 3 months. At that time we will have a discussion about joining another support group called [facility]. 07/26/2015, 9:09 AM

## 2015-08-10 ENCOUNTER — Emergency Department (HOSPITAL_COMMUNITY): Payer: Medicaid Other

## 2015-08-10 ENCOUNTER — Emergency Department (HOSPITAL_COMMUNITY)
Admission: EM | Admit: 2015-08-10 | Discharge: 2015-08-10 | Disposition: A | Discharge status: Home / Self Care | Payer: Medicaid Other | Attending: Emergency Medicine | Admitting: Emergency Medicine

## 2015-08-10 ENCOUNTER — Encounter (HOSPITAL_COMMUNITY): Payer: Self-pay | Admitting: *Deleted

## 2015-08-10 DIAGNOSIS — Z79899 Other long term (current) drug therapy: Secondary | ICD-10-CM | POA: Diagnosis not present

## 2015-08-10 DIAGNOSIS — F419 Anxiety disorder, unspecified: Secondary | ICD-10-CM | POA: Diagnosis not present

## 2015-08-10 DIAGNOSIS — Z7982 Long term (current) use of aspirin: Secondary | ICD-10-CM | POA: Insufficient documentation

## 2015-08-10 DIAGNOSIS — F17210 Nicotine dependence, cigarettes, uncomplicated: Secondary | ICD-10-CM | POA: Insufficient documentation

## 2015-08-10 DIAGNOSIS — Z880 Allergy status to penicillin: Secondary | ICD-10-CM | POA: Insufficient documentation

## 2015-08-10 DIAGNOSIS — Z7951 Long term (current) use of inhaled steroids: Secondary | ICD-10-CM | POA: Insufficient documentation

## 2015-08-10 DIAGNOSIS — I209 Angina pectoris, unspecified: Secondary | ICD-10-CM | POA: Insufficient documentation

## 2015-08-10 DIAGNOSIS — E7800 Pure hypercholesterolemia, unspecified: Secondary | ICD-10-CM | POA: Diagnosis not present

## 2015-08-10 DIAGNOSIS — K219 Gastro-esophageal reflux disease without esophagitis: Secondary | ICD-10-CM | POA: Diagnosis not present

## 2015-08-10 DIAGNOSIS — Y92410 Unspecified street and highway as the place of occurrence of the external cause: Secondary | ICD-10-CM | POA: Diagnosis not present

## 2015-08-10 DIAGNOSIS — F319 Bipolar disorder, unspecified: Secondary | ICD-10-CM | POA: Insufficient documentation

## 2015-08-10 DIAGNOSIS — S3992XA Unspecified injury of lower back, initial encounter: Secondary | ICD-10-CM | POA: Insufficient documentation

## 2015-08-10 DIAGNOSIS — S0990XA Unspecified injury of head, initial encounter: Secondary | ICD-10-CM | POA: Insufficient documentation

## 2015-08-10 DIAGNOSIS — Z9889 Other specified postprocedural states: Secondary | ICD-10-CM | POA: Insufficient documentation

## 2015-08-10 DIAGNOSIS — M1990 Unspecified osteoarthritis, unspecified site: Secondary | ICD-10-CM | POA: Diagnosis not present

## 2015-08-10 DIAGNOSIS — I10 Essential (primary) hypertension: Secondary | ICD-10-CM | POA: Diagnosis not present

## 2015-08-10 DIAGNOSIS — M545 Low back pain: Secondary | ICD-10-CM

## 2015-08-10 DIAGNOSIS — S4991XA Unspecified injury of right shoulder and upper arm, initial encounter: Secondary | ICD-10-CM | POA: Diagnosis present

## 2015-08-10 DIAGNOSIS — G43909 Migraine, unspecified, not intractable, without status migrainosus: Secondary | ICD-10-CM | POA: Diagnosis not present

## 2015-08-10 DIAGNOSIS — Z91040 Latex allergy status: Secondary | ICD-10-CM | POA: Diagnosis not present

## 2015-08-10 DIAGNOSIS — Y998 Other external cause status: Secondary | ICD-10-CM | POA: Insufficient documentation

## 2015-08-10 DIAGNOSIS — Y9389 Activity, other specified: Secondary | ICD-10-CM | POA: Insufficient documentation

## 2015-08-10 DIAGNOSIS — Z86010 Personal history of colonic polyps: Secondary | ICD-10-CM | POA: Diagnosis not present

## 2015-08-10 DIAGNOSIS — M25511 Pain in right shoulder: Secondary | ICD-10-CM

## 2015-08-10 DIAGNOSIS — Z8619 Personal history of other infectious and parasitic diseases: Secondary | ICD-10-CM | POA: Diagnosis not present

## 2015-08-10 MED ORDER — OXYCODONE-ACETAMINOPHEN 5-325 MG PO TABS
1.0000 | ORAL_TABLET | Freq: Once | ORAL | Status: AC
  Administered 2015-08-10: 1 via ORAL
  Filled 2015-08-10: qty 1

## 2015-08-10 MED ORDER — METHOCARBAMOL 500 MG PO TABS: 500.0000 mg | ORAL_TABLET | Freq: Two times a day (BID) | ORAL | Status: DC

## 2015-08-10 MED ORDER — ACETAMINOPHEN 325 MG PO TABS: 650.0000 mg | ORAL_TABLET | Freq: Four times a day (QID) | ORAL | Status: DC | PRN
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
## 2015-08-13 ENCOUNTER — Telehealth (HOSPITAL_COMMUNITY): Payer: Self-pay

## 2015-08-13 DIAGNOSIS — F332 Major depressive disorder, recurrent severe without psychotic features: Secondary | ICD-10-CM
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
## 2015-08-14 NOTE — Telephone Encounter (Signed)
I spoke to Dr. Casimiro Needle who asked me to call patient and have her increase Nortriptyline to 3 a day and then get levels checked in one week. I called the patient and relayed this message and patient voiced her understanding. Order for the Nortriptyline level is in the chart

## 2015-08-23 ENCOUNTER — Encounter (HOSPITAL_COMMUNITY): Payer: Self-pay | Admitting: Emergency Medicine

## 2015-08-23 ENCOUNTER — Emergency Department (HOSPITAL_COMMUNITY)
Admission: EM | Admit: 2015-08-23 | Discharge: 2015-08-23 | Disposition: A | Discharge status: Home / Self Care | Payer: Medicaid Other | Attending: Emergency Medicine | Admitting: Emergency Medicine

## 2015-08-23 DIAGNOSIS — I209 Angina pectoris, unspecified: Secondary | ICD-10-CM | POA: Diagnosis not present

## 2015-08-23 DIAGNOSIS — M545 Low back pain: Secondary | ICD-10-CM | POA: Diagnosis not present

## 2015-08-23 DIAGNOSIS — Z9889 Other specified postprocedural states: Secondary | ICD-10-CM | POA: Diagnosis not present

## 2015-08-23 DIAGNOSIS — I10 Essential (primary) hypertension: Secondary | ICD-10-CM | POA: Insufficient documentation

## 2015-08-23 DIAGNOSIS — G43909 Migraine, unspecified, not intractable, without status migrainosus: Secondary | ICD-10-CM | POA: Insufficient documentation

## 2015-08-23 DIAGNOSIS — Z8619 Personal history of other infectious and parasitic diseases: Secondary | ICD-10-CM | POA: Insufficient documentation

## 2015-08-23 DIAGNOSIS — F329 Major depressive disorder, single episode, unspecified: Secondary | ICD-10-CM | POA: Diagnosis not present

## 2015-08-23 DIAGNOSIS — Z91040 Latex allergy status: Secondary | ICD-10-CM | POA: Insufficient documentation

## 2015-08-23 DIAGNOSIS — M1990 Unspecified osteoarthritis, unspecified site: Secondary | ICD-10-CM | POA: Insufficient documentation

## 2015-08-23 DIAGNOSIS — E7800 Pure hypercholesterolemia, unspecified: Secondary | ICD-10-CM | POA: Diagnosis not present

## 2015-08-23 DIAGNOSIS — Z7951 Long term (current) use of inhaled steroids: Secondary | ICD-10-CM | POA: Insufficient documentation

## 2015-08-23 DIAGNOSIS — K219 Gastro-esophageal reflux disease without esophagitis: Secondary | ICD-10-CM | POA: Diagnosis not present

## 2015-08-23 DIAGNOSIS — M549 Dorsalgia, unspecified: Secondary | ICD-10-CM

## 2015-08-23 DIAGNOSIS — Z7982 Long term (current) use of aspirin: Secondary | ICD-10-CM | POA: Diagnosis not present

## 2015-08-23 DIAGNOSIS — F419 Anxiety disorder, unspecified: Secondary | ICD-10-CM | POA: Diagnosis not present

## 2015-08-23 DIAGNOSIS — Z86010 Personal history of colonic polyps: Secondary | ICD-10-CM | POA: Insufficient documentation

## 2015-08-23 DIAGNOSIS — Z880 Allergy status to penicillin: Secondary | ICD-10-CM | POA: Insufficient documentation

## 2015-08-23 DIAGNOSIS — Z79899 Other long term (current) drug therapy: Secondary | ICD-10-CM | POA: Insufficient documentation

## 2015-08-23 DIAGNOSIS — F17210 Nicotine dependence, cigarettes, uncomplicated: Secondary | ICD-10-CM | POA: Diagnosis not present

## 2015-08-23 MED ORDER — METHOCARBAMOL 500 MG PO TABS: 500.0000 mg | ORAL_TABLET | Freq: Two times a day (BID) | ORAL | Status: DC

## 2015-08-23 MED ORDER — PREDNISONE 20 MG PO TABS: ORAL_TABLET | ORAL | Status: DC

## 2015-08-23 MED ORDER — METHOCARBAMOL 500 MG PO TABS
500.0000 mg | ORAL_TABLET | Freq: Once | ORAL | Status: AC
  Administered 2015-08-23: 500 mg via ORAL
  Filled 2015-08-23: qty 1
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
## 2015-08-23 NOTE — Discharge Instructions (Signed)
Take the prescribed medication as directed.  If you wish to continue taking tylenol that is fine to do so.  Do not exceed more than 2g (2000mg ) of tylenol from all sources during a 24 hour period. Follow-up with your primary care doctor. Return to the ED for new or worsening symptoms.
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
## 2015-09-27 ENCOUNTER — Ambulatory Visit: Payer: Medicaid Other | Admitting: Podiatry

## 2015-10-25 ENCOUNTER — Encounter (HOSPITAL_COMMUNITY): Payer: Self-pay | Admitting: Psychiatry

## 2015-10-25 ENCOUNTER — Ambulatory Visit (INDEPENDENT_AMBULATORY_CARE_PROVIDER_SITE_OTHER): Payer: Self-pay | Admitting: Psychiatry

## 2015-10-25 VITALS — BP 128/80 | HR 81 | Ht 67.0 in | Wt 225.8 lb

## 2015-10-25 DIAGNOSIS — F3342 Major depressive disorder, recurrent, in full remission: Secondary | ICD-10-CM

## 2015-10-25 MED ORDER — NORTRIPTYLINE HCL 25 MG PO CAPS: 50.0000 mg | ORAL_CAPSULE | Freq: Every day | ORAL | Status: DC

## 2015-10-25 MED ORDER — LEVOMILNACIPRAN HCL ER 40 MG PO CP24: 40.0000 mg | ORAL_CAPSULE | Freq: Every day | ORAL | Status: DC

## 2015-10-25 MED ORDER — DIAZEPAM 5 MG PO TABS: 10.0000 mg | ORAL_TABLET | Freq: Two times a day (BID) | ORAL | Status: DC

## 2015-10-25 NOTE — Progress Notes (Signed)
Patient ID: Gloria Lewis, female   DOB: 07/06/1955, 60 y.o.   MRN: [id number] Patient ID: Gloria Lewis, female   DOB: May 08, 1955, 59 y.o.   MRN: [id number] [facility] MD Progress Note  10/25/2015 9:15 AM TACORI WILLHOITE  MRN:  [id number] Subjective:  Shoulder hurting Principal Problem: Major Depression,recurent Mild Diagnosis: Major Depression, Recurent Today the patient is doing fairly well. She still has a great deal of stress related to her 3 children. She recently asked her daughter to leave because she is using drugs. Her other son who is mentally ill is now out of the hospital for about a week was living with her for while but now is in his own place. He takes his medicine he does well. Her other son who does work probably is using is well and no longer lives with her. She therefore has very 3 dysfunctional adult children who use drugs one of which has a severe mental illness. The patient herself is not physically well. It looks like she needs bladder surgery in the next few months. She recently had right shoulder surgery and presently is takes oxycodone from the pain clinic. Her shoulder actually has gotten better but for now she's taking a pain medicine in this patient herself has a significant substance abuse. Problem. She is not actively used in many years. She is proactive about getting well. She's part of a woman's group called [facility] [place]. This is a support group. Today the patient is asking for me to write a letter for her to have a therapy dog. She's actually adopted a baby pit bull. The patient is somewhat upset about her child who has a mental illness. He is seen in the system doesn't make contact with her so that she could be part of the treatment effort. Mentally ill son is avoidant of the patient's involvement. This patient denies daily depression. She actually is sleeping and eating fairly well. She's got good energy. She has no problems concentrating. Her anxiety level is fairly well  controlled. She's gone from living into how still living to a small 1 room apartment. Her brother recently bought her a Printmaker. This patient is not suicidal. She denies any psychotic symptoms at all. At this time she is not using. She takes her medicines consistently and reliably. The patient is an active psychotherapy with Mrs. Marjie Skiff. Total Time spent with patient:30 min  Past Psychiatric History:   Past Medical History:  Past Medical History  Diagnosis Date  . HTN (hypertension)   . Bipolar 1 disorder ([place])   . OA (osteoarthritis)   . GERD (gastroesophageal reflux disease)   . Hypercholesterolemia   . Fever blister   . HA (headache)   . Colon polyp   . CTS (carpal tunnel syndrome)   . Anxiety   . Depression   . Migraines   . Schizo-affective psychosis ([place])   . Anginal pain ([place])     admit 06/2014; had non-ischemic stress test    Past Surgical History  Procedure Laterality Date  . Neck surgery  2009  . Carpal tunnel release  20110 rt/lt  . Polp removed  2011  . Back injection    . 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 Anterior cervical decomp/discectomy fusion  08/27/2011    Procedure: ANTERIOR CERVICAL DECOMPRESSION/DISCECTOMY FUSION 1 LEVEL/HARDWARE REMOVAL;  Surgeon: Eustace Moore, MD;  Location: [place] [facility];  Service: Neurosurgery;  Laterality: Bilateral;  Cervical four-five Anterior cervical decompression/diskectomy, fusion, Plate, Removal of Cervical five-seven Plate  . Hemorrhoid surgery    . Shoulder arthroscopy with subacromial decompression Right 01/24/2015    Procedure: RIGHT SHOULDER ARTHROSCOPY WITH SUBACROMIAL DECOMPRESSION AD DISTAL CLAVICLE RESECTION ;  Surgeon: Justice Britain, MD;  Location: [place];  Service: Orthopedics;  Laterality: Right;  . Cardiac catheterization N/A 11/09/2014    Procedure: Right Heart Cath;  Surgeon: Larey Dresser, MD;  Location: [place] [facility];  Service: Cardiovascular;  Laterality: N/A;  .  Multiple tooth extractions    . Vaginal delivery      x3  . Shoulder arthroscopy with rotator cuff repair Right 05/23/2015    Procedure: RIGHT SHOULDER ARTHROSCOPY WITH REMOVAL OF SUTURE ANCHOR AND POSSIBLE REVISION ROTATOR CUFF REPAIR;  Surgeon: Justice Britain, MD;  Location: [place];  Service: Orthopedics;  Laterality: Right;   Family History:  Family History  Problem Relation Age of Onset  . Coronary artery disease Father   . Cancer Father     head neck   . Hypertension Mother   . Schizophrenia Mother   . Depression Brother   . Prostate cancer Brother   . Anesthesia problems Neg Hx   . Hypotension Neg Hx   . Malignant hyperthermia Neg Hx   . Pseudochol deficiency Neg Hx   . Allergic rhinitis Neg Hx   . Angioedema Neg Hx   . Asthma Neg Hx   . Atopy Neg Hx   . Eczema Neg Hx   . Immunodeficiency Neg Hx   . Urticaria Neg Hx    Family Psychiatric  History:  Social History:  History  Alcohol Use No     History  Drug Use No    Social History   Social History  . Marital Status: Single    Spouse Name: N/A  . Number of Children: N/A  . Years of Education: N/A   Social History Main Topics  . Smoking status: Current Some Day Smoker -- 0.10 packs/day for 30 years    Types: Cigarettes  . Smokeless tobacco: Never Used     Comment: using nicotrol inhaler  . Alcohol Use: No  . Drug Use: No  . Sexual Activity: No   Other Topics Concern  . None   Social History Narrative   Additional Social History:                         Sleep: Good  Appetite:  Fair  Current Medications: Current Outpatient Prescriptions  Medication Sig Dispense Refill  . acetaminophen (TYLENOL) 325 MG tablet Take 2 tablets (650 mg total) by mouth every 6 (six) hours as needed. 30 tablet 0  . amLODipine (NORVASC) 5 MG tablet Take 5 mg by mouth daily.    Marland Kitchen aspirin EC 81 MG EC tablet Take 1 tablet (81 mg total) by mouth daily. 30 tablet 0  . atorvastatin (LIPITOR) 10 MG tablet Take 10 mg by  mouth daily.    . Azelastine HCl 0.15 % SOLN Place 1 spray into both nostrils 2 (two) times daily. 30 mL 5  . budesonide-formoterol (SYMBICORT) 160-4.5 MCG/ACT inhaler Inhale 2 puffs into the lungs 2 (two) times daily as needed (for shortness of breath and wheezing).     . cholecalciferol (VITAMIN D) 1000 UNITS tablet Take 1,000 Units by mouth daily.    . diazepam (VALIUM) 5 MG tablet Take 2 tablets (10 mg total) by mouth 2 (two) times  daily. 120 tablet 4  . docusate sodium (COLACE) 100 MG capsule Take 100 mg by mouth daily as needed for mild constipation. Reported on 07/22/2015    . fluticasone (FLONASE) 50 MCG/ACT nasal spray Place 2 sprays into both nostrils daily. 16 g 0  . folic acid (FOLVITE) 1 MG tablet Take 1 mg by mouth daily.    . Levomilnacipran HCl ER 40 MG CP24 Take 40 mg by mouth daily. 30 capsule 5  . Linaclotide (LINZESS) 145 MCG CAPS capsule Take 145 mcg by mouth daily as needed (constipation).     Marland Kitchen loratadine (CLARITIN) 10 MG tablet Take 1 tablet (10 mg total) by mouth daily. 30 tablet 0  . methocarbamol (ROBAXIN) 500 MG tablet Take 1 tablet (500 mg total) by mouth 2 (two) times daily. 20 tablet 0  . metoCLOPramide (REGLAN) 10 MG tablet Take 1 tablet (10 mg total) by mouth every 6 (six) hours as needed for nausea (nausea/headache). 6 tablet 0  . Multiple Vitamins-Minerals (MULTIVITAMIN WITH MINERALS) tablet Take 1 tablet by mouth every morning.     . naproxen (NAPROSYN) 500 MG tablet Take 1 tablet (500 mg total) by mouth 2 (two) times daily as needed for mild pain, moderate pain or headache (TAKE WITH MEALS.). 20 tablet 0  . nebivolol (BYSTOLIC) 10 MG tablet Take 1 tablet (10 mg total) by mouth daily. 30 tablet 1  . nicotine (NICOTROL) 10 MG inhaler Inhale 1 continuous puffing into the lungs daily as needed for smoking cessation.     . nortriptyline (PAMELOR) 25 MG capsule Take 2 capsules (50 mg total) by mouth at bedtime. 2  qhs 60 capsule 8  . ondansetron (ZOFRAN) 4 MG tablet  Take 1 tablet (4 mg total) by mouth every 8 (eight) hours as needed for nausea or vomiting. 20 tablet 1  . ondansetron (ZOFRAN) 4 MG tablet Take 1 tablet (4 mg total) by mouth every 8 (eight) hours as needed for nausea or vomiting. 20 tablet 0  . Oxycodone HCl 10 MG TABS Take 0.5-1 tablets (5-10 mg total) by mouth every 4 (four) hours as needed. 50 tablet 0  . oxyCODONE-acetaminophen (PERCOCET/ROXICET) 5-325 MG tablet Take 1 tablet by mouth 2 (two) times daily as needed. Reported on 07/22/2015  0  . pantoprazole (PROTONIX) 40 MG tablet Take 40 mg by mouth daily before breakfast.   12  . potassium chloride SA (K-DUR,KLOR-CON) 20 MEQ tablet Take 20 mEq by mouth 2 (two) times daily.     . predniSONE (DELTASONE) 20 MG tablet Take 40 mg by mouth daily for 3 days, then 20mg  by mouth daily for 3 days, then 10mg  daily for 3 days 12 tablet 0  . sucralfate (CARAFATE) 1 G tablet Take 1 tablet (1 g total) by mouth 3 (three) times daily with meals. 90 tablet 0  . triamterene-hydrochlorothiazide (DYAZIDE) 50-25 MG capsule Take 1 capsule by mouth every morning.    . valACYclovir (VALTREX) 1000 MG tablet Take 1,000 mg by mouth daily as needed (for out breaks).     . vitamin B-12 (CYANOCOBALAMIN) 1000 MCG tablet Take 1,000 mcg by mouth daily.    Marland Kitchen zolpidem (AMBIEN CR) 12.5 MG CR tablet Take 1 tablet (12.5 mg total) by mouth at bedtime as needed for sleep. 30 tablet 5   No current facility-administered medications for this visit.    Lab Results: No results found for this or any previous visit (from the past 48 hour(s)).  Physical Findings: AIMS:  , ,  ,  ,  CIWA:    COWS:     Musculoskeletal: Strength & Muscle Tone: within normal limits Gait & Station: normal Patient leans: N/A  Psychiatric Specialty Exam: ROS  Blood pressure 128/80, pulse 81, height 5\' 7"  (1.702 m), weight 225 lb 12.8 oz (102.422 kg).Body mass index is 35.36 kg/(m^2).  General Appearance: Casual  Eye Contact::  Good  Speech:  Clear  and Coherent  Volume:  Normal  Mood:  Euthymic  Affect:  Congruent  Thought Process:  Coherent  Orientation:  Full (Time, Place, and Person)  Thought Content:  WDL  Suicidal Thoughts:  No  Homicidal Thoughts:  No  Memory:  NA  Judgement:  Good  Insight:  Fair  Psychomotor Activity:  Normal  Concentration:  Fair  Recall:  Good  Fund of Knowledge:Good  Language: Good  Akathisia:  No  Handed:  Right  AIMS (if indicated):     Assets:   ADL's:  Intact  Cognition: WNL  Sleep:       Treatment Plan Summary:   At this time the patient is reasonably stable. Her #1 problem is that of major depression which is fairly well controlled on her antidepressants of Fetzim 40 mg and nortriptyline 50 mg. The patient sees her therapist on a regular basis. Her second problem is significant anxiety. The patient actually does well and is consistent with taking Valium 10 mg twice a day. Her third problem is that of substance abuse. She is a long history of this but she has been sober and clean for well over 5 years. She goes to support groups and talk to her therapist about this problem as well. Patient has significant psychosocial issues as well as physical problems. Bladder surgery is probably coming up but her pain in her arm and shoulder seem to be fairly well controlled at this time. Our hope is they get better and that she comes off of the opiates. This patient to return to see me in 3 months.

## 2015-10-30 ENCOUNTER — Other Ambulatory Visit: Payer: Self-pay | Admitting: Obstetrics and Gynecology

## 2015-10-30 ENCOUNTER — Other Ambulatory Visit (HOSPITAL_COMMUNITY)
Admission: RE | Admit: 2015-10-30 | Discharge: 2015-10-30 | Disposition: A | Discharge status: Home / Self Care | Payer: Medicaid Other | Source: Ambulatory Visit | Attending: Obstetrics and Gynecology | Admitting: Obstetrics and Gynecology

## 2015-10-30 DIAGNOSIS — Z1151 Encounter for screening for human papillomavirus (HPV): Secondary | ICD-10-CM | POA: Diagnosis present

## 2015-10-30 DIAGNOSIS — Z01419 Encounter for gynecological examination (general) (routine) without abnormal findings: Secondary | ICD-10-CM | POA: Diagnosis present

## 2015-10-31 LAB — CYTOLOGY - PAP

## 2015-11-14 ENCOUNTER — Encounter: Payer: Self-pay | Admitting: Podiatry

## 2015-11-14 ENCOUNTER — Ambulatory Visit (INDEPENDENT_AMBULATORY_CARE_PROVIDER_SITE_OTHER): Payer: Medicaid Other | Admitting: Podiatry

## 2015-11-14 ENCOUNTER — Ambulatory Visit (INDEPENDENT_AMBULATORY_CARE_PROVIDER_SITE_OTHER): Payer: Medicaid Other

## 2015-11-14 VITALS — BP 114/69 | HR 72 | Resp 12

## 2015-11-14 DIAGNOSIS — M7662 Achilles tendinitis, left leg: Secondary | ICD-10-CM | POA: Diagnosis not present

## 2015-11-14 DIAGNOSIS — M79672 Pain in left foot: Secondary | ICD-10-CM

## 2015-11-14 DIAGNOSIS — M779 Enthesopathy, unspecified: Secondary | ICD-10-CM

## 2015-11-14 DIAGNOSIS — M7750 Other enthesopathy of unspecified foot: Secondary | ICD-10-CM

## 2015-11-14 DIAGNOSIS — R52 Pain, unspecified: Secondary | ICD-10-CM

## 2015-11-14 DIAGNOSIS — M7661 Achilles tendinitis, right leg: Secondary | ICD-10-CM | POA: Diagnosis not present

## 2015-11-14 MED ORDER — TRIAMCINOLONE ACETONIDE 10 MG/ML IJ SUSP
10.0000 mg | Freq: Once | INTRAMUSCULAR | Status: AC
  Administered 2015-11-14: 10 mg

## 2015-11-14 NOTE — Patient Instructions (Signed)

## 2015-11-14 NOTE — Progress Notes (Signed)
   Subjective:    Patient ID: Gloria Lewis, female    DOB: 13-May-1955, 60 y.o.   MRN: [id number]  HPI Chief Complaint  Patient presents with  . Foot Pain    RT BACK OF THE FOOT HAVE A KNOT/PAINFUL FOR 6 MONTHS. FOOT IS WORSE WHEN PUTTING PRESSURE ON IT. TRIED NO TREATMENT.  ALSO, LT LATERAL SIDE OF THE FOOT IS SORE.      Review of Systems  Musculoskeletal: Positive for gait problem.       Objective:   Physical Exam        Assessment & Plan:

## 2015-11-14 NOTE — Progress Notes (Signed)
Subjective:     Patient ID: Gloria Lewis, female   DOB: 04-Jul-1955, 60 y.o.   MRN: [id number]  HPI patient presents stating that she has a lot of discomfort in her left ankle of a more recent duration and is had chronic discomfort in the Achilles tendons that's mild in nature but present   Review of Systems  All other systems reviewed and are negative.      Objective:   Physical Exam  Constitutional: She is oriented to person, place, and time.  Cardiovascular: Intact distal pulses.   Musculoskeletal: Normal range of motion.  Neurological: She is oriented to person, place, and time.  Skin: Skin is warm.  Nursing note and vitals reviewed.  neurovascular status found to be intact with muscle strength adequate range of motion within normal limits with patient found to have discomfort in the sinus tarsi left with inflammation and fluid within the joint itself and is noted to have mild discomfort in the Achilles tendon in the musculotendinous junction bilateral with mild equinus condition. Patient is found to have good digital perfusion and is well oriented 3     Assessment:     Inflammatory capsulitis sinus tarsitis left sinus tarsi along with mild Achilles tendinitis bilateral    Plan:     H&P and x-rays of both feet were reviewed. Today I went ahead and I did careful sinus tarsi injection left 3 mg Kenalog 5 mg Xylocaine advised on reduced activity and supportive shoe gear usage and also gave instructions on Achilles tendon stretches with sheets passed out to the patient explaining this. Patient will be seen back to recheck as needed  X-ray report indicates that there is mild depression of the arch bilateral with spur formation and no indications of stress fracture or advanced arthritis

## 2015-12-01 ENCOUNTER — Encounter (HOSPITAL_COMMUNITY): Payer: Self-pay | Admitting: Emergency Medicine

## 2015-12-01 ENCOUNTER — Emergency Department (HOSPITAL_COMMUNITY)
Admission: EM | Admit: 2015-12-01 | Discharge: 2015-12-01 | Disposition: A | Discharge status: Home / Self Care | Payer: Medicaid Other | Attending: Emergency Medicine | Admitting: Emergency Medicine

## 2015-12-01 DIAGNOSIS — I10 Essential (primary) hypertension: Secondary | ICD-10-CM | POA: Insufficient documentation

## 2015-12-01 DIAGNOSIS — R519 Headache, unspecified: Secondary | ICD-10-CM

## 2015-12-01 DIAGNOSIS — R51 Headache: Secondary | ICD-10-CM | POA: Diagnosis present

## 2015-12-01 DIAGNOSIS — Z7982 Long term (current) use of aspirin: Secondary | ICD-10-CM | POA: Insufficient documentation

## 2015-12-01 DIAGNOSIS — Z79899 Other long term (current) drug therapy: Secondary | ICD-10-CM | POA: Diagnosis not present

## 2015-12-01 DIAGNOSIS — R110 Nausea: Secondary | ICD-10-CM | POA: Insufficient documentation

## 2015-12-01 DIAGNOSIS — F17210 Nicotine dependence, cigarettes, uncomplicated: Secondary | ICD-10-CM | POA: Diagnosis not present

## 2015-12-01 LAB — C-REACTIVE PROTEIN: CRP: 0.6 mg/dL (ref <1.0)

## 2015-12-01 LAB — I-STAT CHEM 8, ED
BUN: 10 mg/dL (ref 6–20)
Calcium, Ion: 1.19 mmol/L (ref 1.13–1.30)
Chloride: 100 mmol/L — ABNORMAL LOW (ref 101–111)
Creatinine, Ser: 0.8 mg/dL (ref 0.44–1.00)
Glucose, Bld: 99 mg/dL (ref 65–99)
HEMATOCRIT: 44 % (ref 36.0–46.0)
Hemoglobin: 15 g/dL (ref 12.0–15.0)
Potassium: 4 mmol/L (ref 3.5–5.1)
SODIUM: 140 mmol/L (ref 135–145)
TCO2: 31 mmol/L (ref 0–100)

## 2015-12-01 LAB — SEDIMENTATION RATE: SED RATE: 16 mm/h (ref 0–22)

## 2015-12-01 MED ORDER — DEXAMETHASONE SODIUM PHOSPHATE 10 MG/ML IJ SOLN
10.0000 mg | Freq: Once | INTRAMUSCULAR | Status: AC
  Administered 2015-12-01: 10 mg via INTRAVENOUS
  Filled 2015-12-01: qty 1

## 2015-12-01 MED ORDER — DIPHENHYDRAMINE HCL 50 MG/ML IJ SOLN
25.0000 mg | Freq: Once | INTRAMUSCULAR | Status: AC
  Administered 2015-12-01: 25 mg via INTRAVENOUS
  Filled 2015-12-01: qty 1

## 2015-12-01 MED ORDER — KETOROLAC TROMETHAMINE 30 MG/ML IJ SOLN
30.0000 mg | Freq: Once | INTRAMUSCULAR | Status: AC
  Administered 2015-12-01: 30 mg via INTRAVENOUS
  Filled 2015-12-01: qty 1

## 2015-12-01 MED ORDER — METOCLOPRAMIDE HCL 5 MG/ML IJ SOLN
10.0000 mg | Freq: Once | INTRAMUSCULAR | Status: AC
  Administered 2015-12-01: 10 mg via INTRAVENOUS
  Filled 2015-12-01: qty 2

## 2015-12-01 MED ORDER — SODIUM CHLORIDE 0.9 % IV BOLUS (SEPSIS)
1000.0000 mL | Freq: Once | INTRAVENOUS | Status: AC
  Administered 2015-12-01: 1000 mL via INTRAVENOUS

## 2015-12-01 NOTE — ED Triage Notes (Signed)
Patient c/o headache. States she has been under a lot of stress lately due to health problems and her son's mental issues. Patient sees pain management clinic for her back and bladder where she receives oxydone-acetaminophen which givers her a headache.

## 2015-12-01 NOTE — ED Provider Notes (Signed)
[facility] DEPT Provider Note   CSN: [id number] Arrival date & time: 12/01/15  1059     History   Chief Complaint Chief Complaint  Patient presents with  . Headache    HPI Gloria Lewis is a 60 y.o. female who presents with a headache. PMH significant for HTN, migraines, chronic neck, right shoulder, and back pain, OA, GERD, HLD, bipolar 1 disorder, schizoaffective disorder, anxiety, and depression. She states over the past month she has had a headache. It is on the right side, over the eye and temporal area, is throbbing in nature. It is constant throughout the day. She states it is the most severe headache she has had when compared to previous headaches and the longest lasting. Massaging her temples makes it better. She reports associated phonophobia, photophobia, and nausea. She currently goes to pain management for her back and takes Percocet daily which she thinks exacerbates her headache. She also reports increased stress due to her son who also has schizophrenia and does not take his medicines regularly. She denies fever, neck stiffness, syncope, head trauma. She states she has been evaluated by a neurologist in the past however does not take any medicines for her migraines because she states she has a lot of allergies. She reports having imaging of her head in the past as well.  HPI  Past Medical History:  Diagnosis Date  . Anginal pain ([place])    admit 06/2014; had non-ischemic stress test  . Anxiety   . Bipolar 1 disorder ([place])   . Colon polyp   . CTS (carpal tunnel syndrome)   . Depression   . Fever blister   . GERD (gastroesophageal reflux disease)   . HA (headache)   . HTN (hypertension)   . Hypercholesterolemia   . Migraines   . OA (osteoarthritis)   . Schizo-affective psychosis [facility])     Patient Active Problem List   Diagnosis Date Noted  . Chronic rhinitis 07/22/2015  . Surgery, elective 05/23/2015  . S/P arthroscopy of shoulder 05/23/2015  . Chronic  migraine without aura without status migrainosus, not intractable 10/18/2014  . Tobacco abuse 10/18/2014  . Obesity 09/23/2014  . COPD 09/02/2014  . Pulmonary hypertension ([place]) 08/31/2014  . Respiratory failure with hypoxia ([place]) 08/14/2014  . Cigarette smoker 07/28/2014  . Major depressive disorder, recurrent episode, moderate ([place]) 07/06/2014  . Essential hypertension   . SOB (shortness of breath) 06/21/2014  . Precordial pain 06/21/2014  . GERD (gastroesophageal reflux disease) 06/21/2014  . Chest pain 06/21/2014  . HTN (hypertension)   . Neck pain 01/08/2014  . Major depressive disorder, recurrent episode, severe, without mention of psychotic behavior 10/14/2012  . Schizoaffective disorder ([place]) 05/26/2012  . Parathyroid adenoma 10/06/2010  . Hyperparathyroidism, primary ([place]) 10/06/2010  . DEGENERATIVE DISC DISEASE, LUMBOSACRAL SPINE 05/21/2007  . DERMATOPHYTOSIS OF THE BODY 05/10/2007  . 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 Anxiety state 03/24/2007  . DENTAL PAIN 03/24/2007  . SHOULDER PAIN, LEFT 03/24/2007    Past Surgical History:  Procedure Laterality Date  . ANTERIOR CERVICAL DECOMP/DISCECTOMY FUSION  08/27/2011   Procedure: ANTERIOR CERVICAL DECOMPRESSION/DISCECTOMY FUSION 1 LEVEL/HARDWARE REMOVAL;  Surgeon: Eustace Moore, MD;  Location: [facility] NEURO ORS;  Service: Neurosurgery;  Laterality: Bilateral;  Cervical four-five Anterior cervical decompression/diskectomy, fusion, Plate, Removal of Cervical five-seven Plate  . back injection    . CARDIAC CATHETERIZATION N/A 11/09/2014   Procedure: Right Heart Cath;  Surgeon: Larey Dresser, MD;  Location: [facility] CV LAB;  Service: Cardiovascular;  Laterality: N/A;  . CARPAL TUNNEL RELEASE  20110 rt/lt  . HEMORRHOID SURGERY    . MULTIPLE TOOTH EXTRACTIONS    . NECK SURGERY  2009  . PITUITARY SURGERY     Had gland removed from producing too much calcium  . polp removed  2011  . SHOULDER ARTHROSCOPY WITH ROTATOR CUFF  REPAIR Right 05/23/2015   Procedure: RIGHT SHOULDER ARTHROSCOPY WITH REMOVAL OF SUTURE ANCHOR AND POSSIBLE REVISION ROTATOR CUFF REPAIR;  Surgeon: Justice Britain, MD;  Location: [facility];  Service: Orthopedics;  Laterality: Right;  . SHOULDER ARTHROSCOPY WITH SUBACROMIAL DECOMPRESSION Right 01/24/2015   Procedure: RIGHT SHOULDER ARTHROSCOPY WITH SUBACROMIAL DECOMPRESSION AD DISTAL CLAVICLE RESECTION ;  Surgeon: Justice Britain, MD;  Location: [facility];  Service: Orthopedics;  Laterality: Right;  Marland Kitchen VAGINAL DELIVERY     x3    OB History    No data available       Home Medications    Prior to Admission medications   Medication Sig Start Date End Date Taking? Authorizing Provider  acetaminophen (TYLENOL) 325 MG tablet Take 2 tablets (650 mg total) by mouth every 6 (six) hours as needed. 08/10/15   Waynetta Pean, PA-C  amLODipine (NORVASC) 5 MG tablet Take 5 mg by mouth daily.    Historical Provider, MD  aspirin EC 81 MG EC tablet Take 1 tablet (81 mg total) by mouth daily. 06/23/14   Orson Eva, MD  atorvastatin (LIPITOR) 10 MG tablet Take 10 mg by mouth daily.    Historical Provider, MD  Azelastine HCl 0.15 % SOLN Place 1 spray into both nostrils 2 (two) times daily. 07/22/15   Adelina Mings, MD  budesonide-formoterol [facility]) 160-4.5 MCG/ACT inhaler Inhale 2 puffs into the lungs 2 (two) times daily as needed (for shortness of breath and wheezing).     Historical Provider, MD  cholecalciferol (VITAMIN D) 1000 UNITS tablet Take 1,000 Units by mouth daily.    Historical Provider, MD  diazepam (VALIUM) 5 MG tablet Take 2 tablets (10 mg total) by mouth 2 (two) times daily. 10/25/15   Norma Fredrickson, MD  docusate sodium (COLACE) 100 MG capsule Take 100 mg by mouth daily as needed for mild constipation. Reported on 07/22/2015    Historical Provider, MD  fluticasone (FLONASE) 50 MCG/ACT nasal spray Place 2 sprays into both nostrils daily. 03/21/15   Mercedes Camprubi-Soms, PA-C  folic acid (FOLVITE) 1 MG  tablet Take 1 mg by mouth daily.    Historical Provider, MD  Levomilnacipran HCl ER 40 MG CP24 Take 40 mg by mouth daily. 10/25/15   Norma Fredrickson, MD  Linaclotide [facility]) 145 MCG CAPS capsule Take 145 mcg by mouth daily as needed (constipation).     Historical Provider, MD  loratadine (CLARITIN) 10 MG tablet Take 1 tablet (10 mg total) by mouth daily. 03/21/15   Mercedes Camprubi-Soms, PA-C  methocarbamol (ROBAXIN) 500 MG tablet Take 1 tablet (500 mg total) by mouth 2 (two) times daily. 08/23/15   Larene Pickett, PA-C  metoCLOPramide (REGLAN) 10 MG tablet Take 1 tablet (10 mg total) by mouth every 6 (six) hours as needed for nausea (nausea/headache). 03/21/15   Mercedes Camprubi-Soms, PA-C  Multiple Vitamins-Minerals (MULTIVITAMIN WITH MINERALS) tablet Take 1 tablet by mouth every morning.     Historical Provider, MD  naproxen (NAPROSYN) 500 MG tablet Take 1 tablet (500 mg total) by mouth 2 (two) times daily as needed for mild pain, moderate pain or headache (TAKE WITH MEALS.). 03/21/15  Mercedes Camprubi-Soms, PA-C  nebivolol (BYSTOLIC) 10 MG tablet Take 1 tablet (10 mg total) by mouth daily. 08/14/14   Tammy S Parrett, NP  nicotine (NICOTROL) 10 MG inhaler Inhale 1 continuous puffing into the lungs daily as needed for smoking cessation.     Historical Provider, MD  nortriptyline (PAMELOR) 25 MG capsule Take 2 capsules (50 mg total) by mouth at bedtime. 2  qhs 10/25/15   Norma Fredrickson, MD  ondansetron (ZOFRAN) 4 MG tablet Take 1 tablet (4 mg total) by mouth every 8 (eight) hours as needed for nausea or vomiting. 01/24/15   Tracy Shuford, PA-C  ondansetron (ZOFRAN) 4 MG tablet Take 1 tablet (4 mg total) by mouth every 8 (eight) hours as needed for nausea or vomiting. 05/23/15   Olivia Mackie Shuford, PA-C  Oxycodone HCl 10 MG TABS Take 0.5-1 tablets (5-10 mg total) by mouth every 4 (four) hours as needed. 05/23/15   Olivia Mackie Shuford, PA-C  oxyCODONE-acetaminophen (PERCOCET/ROXICET) 5-325 MG tablet Take 1 tablet  by mouth 2 (two) times daily as needed. Reported on 07/22/2015 05/11/15   Historical Provider, MD  pantoprazole (PROTONIX) 40 MG tablet Take 40 mg by mouth daily before breakfast.  06/25/14   Historical Provider, MD  potassium chloride SA (K-DUR,KLOR-CON) 20 MEQ tablet Take 20 mEq by mouth 2 (two) times daily.     Historical Provider, MD  predniSONE (DELTASONE) 20 MG tablet Take 40 mg by mouth daily for 3 days, then 18m by mouth daily for 3 days, then 128mdaily for 3 days 08/23/15   LiLarene PickettPA-C  sucralfate (CARAFATE) 1 G tablet Take 1 tablet (1 g total) by mouth 3 (three) times daily with meals. 06/24/14   DaOrson EvaMD  triamterene-hydrochlorothiazide (DYAZIDE) 50-25 MG capsule Take 1 capsule by mouth every morning.    Historical Provider, MD  valACYclovir (VALTREX) 1000 MG tablet Take 1,000 mg by mouth daily as needed (for out breaks).     Historical Provider, MD  vitamin B-12 (CYANOCOBALAMIN) 1000 MCG tablet Take 1,000 mcg by mouth daily.    Historical Provider, MD  zolpidem (AMBIEN CR) 12.5 MG CR tablet Take 1 tablet (12.5 mg total) by mouth at bedtime as needed for sleep. 03/20/15   GeNorma FredricksonMD    Family History Family History  Problem Relation Age of Onset  . Coronary artery disease Father   . Cancer Father     head neck   . Hypertension Mother   . Schizophrenia Mother   . Depression Brother   . Prostate cancer Brother   . Anesthesia problems Neg Hx   . Hypotension Neg Hx   . Malignant hyperthermia Neg Hx   . Pseudochol deficiency Neg Hx   . Allergic rhinitis Neg Hx   . Angioedema Neg Hx   . Asthma Neg Hx   . Atopy Neg Hx   . Eczema Neg Hx   . Immunodeficiency Neg Hx   . Urticaria Neg Hx     Social History Social History  Substance Use Topics  . Smoking status: Current Some Day Smoker    Packs/day: 0.10    Years: 30.00    Types: Cigarettes  . Smokeless tobacco: Never Used     Comment: using nicotrol inhaler  . Alcohol use No     Allergies   Aspirin;  Effexor [venlafaxine hydrochloride]; Latex; Penicillins; Zithromax [azithromycin dihydrate]; Amoxicillin; Chantix [varenicline tartrate]; Hydrocodone; Norco [hydrocodone-acetaminophen]; Paroxetine hcl; and Tramadol   Review of Systems Review of Systems  Constitutional: Negative  for chills and fever.  Eyes: Positive for photophobia.  Gastrointestinal: Positive for nausea. Negative for abdominal pain and vomiting.  Musculoskeletal: Negative for neck stiffness.  Neurological: Negative for dizziness, seizures, syncope and numbness.  All other systems reviewed and are negative.    Physical Exam Updated Vital Signs BP 132/85 (BP Location: Right Arm)   Pulse 68   Temp 98.4 F (36.9 C) (Oral)   Resp 16   SpO2 100%   Physical Exam  Constitutional: She is oriented to person, place, and time. She appears well-developed and well-nourished. No distress.  HENT:  Head: Normocephalic and atraumatic.  Eyes: Conjunctivae are normal. Pupils are equal, round, and reactive to light. Right eye exhibits no discharge. Left eye exhibits no discharge. No scleral icterus.  Neck: Normal range of motion. Neck supple.  Cardiovascular: Normal rate and regular rhythm.   No murmur heard. Pulmonary/Chest: Effort normal and breath sounds normal. No respiratory distress.  Abdominal: Soft. She exhibits no distension. There is no tenderness.  Musculoskeletal: She exhibits no edema.  Neurological: She is alert and oriented to person, place, and time.  Mental Status:  Alert, oriented, thought content appropriate, able to give a coherent history. Speech fluent without evidence of aphasia. Able to follow 2 step commands without difficulty.  Cranial Nerves:  II:  Peripheral visual fields grossly normal, pupils equal, round, reactive to light III,IV, VI: ptosis not present, extra-ocular motions intact bilaterally  V,VII: smile symmetric, facial light touch sensation equal VIII: hearing grossly normal to voice  X:  uvula elevates symmetrically  XI: bilateral shoulder shrug symmetric and strong XII: midline tongue extension without fassiculations Motor:  Normal tone. 5/5 in upper and lower extremities bilaterally including strong and equal grip strength and dorsiflexion/plantar flexion Sensory: Pinprick and light touch normal in all extremities.  Cerebellar: normal finger-to-nose with bilateral upper extremities Gait: normal gait and balance CV: distal pulses palpable throughout    Skin: Skin is warm and dry.  Psychiatric: She has a normal mood and affect. Her behavior is normal.  Nursing note and vitals reviewed.    ED Treatments / Results  Labs (all labs ordered are listed, but only abnormal results are displayed) Labs Reviewed - No data to display  EKG  EKG Interpretation None       Radiology No results found.  Procedures Procedures (including critical care time)  Medications Ordered in ED Medications  ketorolac (TORADOL) 30 MG/ML injection 30 mg (30 mg Intravenous Given 12/01/15 1207)  sodium chloride 0.9 % bolus 1,000 mL (1,000 mLs Intravenous New Bag/Given 12/01/15 1207)  metoCLOPramide (REGLAN) injection 10 mg (10 mg Intravenous Given 12/01/15 1208)  diphenhydrAMINE (BENADRYL) injection 25 mg (25 mg Intravenous Given 12/01/15 1208)  dexamethasone (DECADRON) injection 10 mg (10 mg Intravenous Given 12/01/15 1211)     Initial Impression / Assessment and Plan / ED Course  I have reviewed the triage vital signs and the nursing notes.  Pertinent labs & imaging results that were available during my care of the patient were reviewed by me and considered in my medical decision making (see chart for details).  Clinical Course   60 year old female presents with a headache most likely a migraine vs rebound headache from opioid pain medication. Patient is afebrile, not tachycardic or tachypneic, normotensive, and not hypoxic. Doubt SAH, meningitis/encephalitis, subdural hematoma,  epidural hematoma, temporal arteritis, acute glaucoma. Although she describes it as the worst headache of her life it has been ongoing for over one month. She denies fever,  neck pain, head trauma. ESR and CRP obtained to r/o temporal arteritis since she does have pain over temporal area however this was normal as well. Migraine cocktail given with IVF.   On recheck, patient states headache is not gone but it is somewhat better and she would like to go home to sleep. Patient is NAD, non-toxic, with stable VS. Patient is informed of clinical course, understands medical decision making process, and agrees with plan. Opportunity for questions provided and all questions answered. Return precautions given.  Final Clinical Impressions(s) / ED Diagnoses   Final diagnoses:  Nonintractable headache, unspecified chronicity pattern, unspecified headache type    New Prescriptions New Prescriptions   No medications on file     Recardo Evangelist, PA-C 12/01/15 1437    Tanna Furry, MD 12/10/15 [phone]

## 2015-12-02 ENCOUNTER — Telehealth: Payer: Self-pay | Admitting: Neurology

## 2015-12-02 NOTE — Telephone Encounter (Signed)
Gloria Lewis Oct 02, 2055. Her # is [phone]. She has had a bad headache for about 3 days. She said she was wondering about getting an appointment for Botox or something to help with her migraines. Thank you

## 2015-12-02 NOTE — Telephone Encounter (Signed)
Patient made aware that she will need an appt for evaluation with Dr. Tomi Likens prior to Botox since it has been over a year since we have seen her. She states she has called "numerous" times and was never called back. No documentation in the chart.   Brandy- please make follow up appt for patient.

## 2015-12-03 ENCOUNTER — Other Ambulatory Visit (HOSPITAL_COMMUNITY): Payer: Self-pay

## 2015-12-03 MED ORDER — QUETIAPINE FUMARATE 25 MG PO TABS: 25.0000 mg | ORAL_TABLET | Freq: Two times a day (BID) | ORAL | 2 refills | Status: DC

## 2015-12-03 NOTE — Progress Notes (Signed)
Patient called and would like to stop Valium and start back on Seroquel. Dr. Casimiro Needle agreed with this and 25 mg 1 po BID was sent into the pharmacy.

## 2015-12-17 NOTE — Telephone Encounter (Signed)
Called patient and she said she would call us back toward the end of the week to see about a follow up appointment with Dr. Tomi Likens. She said the cluster headaches have gotten better.

## 2016-01-03 ENCOUNTER — Encounter (HOSPITAL_COMMUNITY): Payer: Self-pay | Admitting: Emergency Medicine

## 2016-01-03 ENCOUNTER — Emergency Department (HOSPITAL_COMMUNITY)
Admission: EM | Admit: 2016-01-03 | Discharge: 2016-01-03 | Disposition: A | Discharge status: Home / Self Care | Payer: Medicaid Other | Attending: Emergency Medicine | Admitting: Emergency Medicine

## 2016-01-03 ENCOUNTER — Emergency Department (HOSPITAL_COMMUNITY): Payer: Medicaid Other

## 2016-01-03 DIAGNOSIS — I10 Essential (primary) hypertension: Secondary | ICD-10-CM | POA: Diagnosis not present

## 2016-01-03 DIAGNOSIS — K1379 Other lesions of oral mucosa: Secondary | ICD-10-CM | POA: Insufficient documentation

## 2016-01-03 DIAGNOSIS — G8929 Other chronic pain: Secondary | ICD-10-CM | POA: Diagnosis not present

## 2016-01-03 DIAGNOSIS — Z91040 Latex allergy status: Secondary | ICD-10-CM | POA: Insufficient documentation

## 2016-01-03 DIAGNOSIS — F17210 Nicotine dependence, cigarettes, uncomplicated: Secondary | ICD-10-CM | POA: Insufficient documentation

## 2016-01-03 DIAGNOSIS — Z7982 Long term (current) use of aspirin: Secondary | ICD-10-CM | POA: Insufficient documentation

## 2016-01-03 DIAGNOSIS — J449 Chronic obstructive pulmonary disease, unspecified: Secondary | ICD-10-CM | POA: Diagnosis not present

## 2016-01-03 DIAGNOSIS — M25511 Pain in right shoulder: Secondary | ICD-10-CM | POA: Insufficient documentation

## 2016-01-03 DIAGNOSIS — M25512 Pain in left shoulder: Secondary | ICD-10-CM | POA: Insufficient documentation

## 2016-01-03 DIAGNOSIS — Z79899 Other long term (current) drug therapy: Secondary | ICD-10-CM | POA: Diagnosis not present

## 2016-01-03 MED ORDER — METHOCARBAMOL 500 MG PO TABS
1000.0000 mg | ORAL_TABLET | Freq: Once | ORAL | Status: AC
  Administered 2016-01-03: 1000 mg via ORAL
  Filled 2016-01-03: qty 2

## 2016-01-03 MED ORDER — ACETAMINOPHEN 325 MG PO TABS
650.0000 mg | ORAL_TABLET | Freq: Once | ORAL | Status: AC
  Administered 2016-01-03: 650 mg via ORAL
  Filled 2016-01-03: qty 2

## 2016-01-03 MED ORDER — LIDOCAINE VISCOUS 2 % MT SOLN: 15.0000 mL | Freq: Four times a day (QID) | OROMUCOSAL | 0 refills | Status: DC | PRN

## 2016-01-03 MED ORDER — METHOCARBAMOL 500 MG PO TABS: 500.0000 mg | ORAL_TABLET | Freq: Two times a day (BID) | ORAL | 0 refills | Status: DC

## 2016-01-03 NOTE — ED Triage Notes (Signed)
Patient states chronic shoulder problems that she goes to a pain clinic for.   Patient states also having back pain and mouth pain.   Patient states her pain medication at home not taking care of her pain.   Patient states heat not helping either.

## 2016-01-03 NOTE — ED Notes (Signed)
Patient reports pain in her shoulders bilaterally. Also complains of a headache but characterizes it as her "normal" chronic headache pain. Additionally she reports pain and sensitivity in her mouth and lips. Feels this may be related to a new pain medication that her doctor has prescribed but she does not remember the name of the medication. Advises the pain is much worse when she smokes or "vapes".

## 2016-01-03 NOTE — Discharge Instructions (Addendum)
Read the information below.  Your x-rays did not show any acute fractures or dislocations. They do show degenerative changes consistent with arthritis. I have prescribed a muscle relaxer. This can make you drowsy, do not drive after taking. You can also take tylenol 650mg  every 6hrs for pain relief. Apply ice for 20 minute increments. Activity as tolerated helps to prevent soreness/stiffness.  It is very important to follow up with your pain clinic regarding your pain management plan if it is not adequately treating your pain. Please call on Monday. I have prescribed viscous lidocaine for relief of mouth pain. Please take as directed. Please apply Vaseline to corners of mouth to create a barrier and allow for healing.  Please follow up with your primary provider for re-evaluation next week.  Use the prescribed medication as directed.  Please discuss all new medications with your pharmacist.   You may return to the Emergency Department at any time for worsening condition or any new symptoms that concern you. Return to ED if you develop chest pain, shortness of breath, radiation of pain into arms or jaw, numbness, or weakness.

## 2016-01-03 NOTE — ED Provider Notes (Signed)
[facility] DEPT Provider Note   CSN: [id number] Arrival date & time: 01/03/16  1421  By signing my name below, I, Gloria Lewis, attest that this documentation has been prepared under the direction and in the presence of [employer] PA-C. Electronically Signed: Sonum Lewis, Education administrator. 01/03/16. 4:00 PM.  History   Chief Complaint Chief Complaint  Patient presents with  . Shoulder Injury  . Dental Pain   The history is provided by the patient. No language interpreter was used.     HPI Comments: Gloria Lewis is a 60 y.o. female who presents to the Emergency Department complaining of constant, unchanged mouth sensitivity that began 1 week after starting a medication called nucynta. She is followed by a pain management clinic and states this medication was started by them to treat her chronic pain. She reports decreased cigarette use but states she recently started vaping which has aggravated her mouth burning. She states she stopped taking this medication as it is related to Tramadol which she is allergic to. She reports when taking tramadol she had similar mouth sensitivity. She has attempted to rinse mouth with Listerine and cold water without relief. She denies fever, trouble swallowing, oral ulcerations, oral swelling, or difficulty breathing.  She also complains of worsening chronic bilateral shoulder pain. She has undergone two right shoulder arthroscopies, with most recent procedure in February 2017. She also recently received a steroid injection in right shoulder approximately two weeks ago with minimal relief. Left shoulder pain started shortly after her last surgery in February 2017 (although review of records show PMHx of left shoulder pain in 2008) and she suspects she may have arthritis. She also has undergone two neck surgeries, most recently 2013 and believes this may be contributing to the muscle soreness she experiences in her shoulders. She describes the pain as an aching dull  pain that is unchanged with ice application.She denies recent trauma or injuries. She states the pain is worse with movement and eased with rest. She denies taking any pain medication today. She does state in the past robaxin has worked well for her pain. She denies fever, numbness, weakness, increased swelling, redness, increased warmth, changes in vision, photophobia, bowel/bladder incontinence. She denies history of CA or IV drug use.    Past Medical History:  Diagnosis Date  . Anginal pain ([place])    admit 06/2014; had non-ischemic stress test  . Anxiety   . Bipolar 1 disorder ([place])   . Colon polyp   . CTS (carpal tunnel syndrome)   . Depression   . Fever blister   . GERD (gastroesophageal reflux disease)   . HA (headache)   . HTN (hypertension)   . Hypercholesterolemia   . Migraines   . OA (osteoarthritis)   . Schizo-affective psychosis [facility])     Patient Active Problem List   Diagnosis Date Noted  . Chronic rhinitis 07/22/2015  . Surgery, elective 05/23/2015  . S/P arthroscopy of shoulder 05/23/2015  . Chronic migraine without aura without status migrainosus, not intractable 10/18/2014  . Tobacco abuse 10/18/2014  . Obesity 09/23/2014  . COPD 09/02/2014  . Pulmonary hypertension 08/31/2014  . Respiratory failure with hypoxia ([place]) 08/14/2014  . Cigarette smoker 07/28/2014  . Major depressive disorder, recurrent episode, moderate ([place]) 07/06/2014  . Essential hypertension   . SOB (shortness of breath) 06/21/2014  . Precordial pain 06/21/2014  . GERD (gastroesophageal reflux disease) 06/21/2014  . Chest pain 06/21/2014  . HTN (hypertension)   . Neck pain 01/08/2014  .  Major depressive disorder, recurrent episode, severe, without mention of psychotic behavior 10/14/2012  . Schizoaffective disorder ([place]) 05/26/2012  . Parathyroid adenoma 10/06/2010  . Hyperparathyroidism, primary ([place]) 10/06/2010  . DEGENERATIVE DISC DISEASE, LUMBOSACRAL SPINE 05/21/2007  .  DERMATOPHYTOSIS OF THE BODY 05/10/2007  . 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 Anxiety state 03/24/2007  . DENTAL PAIN 03/24/2007  . SHOULDER PAIN, LEFT 03/24/2007    Past Surgical History:  Procedure Laterality Date  . ANTERIOR CERVICAL DECOMP/DISCECTOMY FUSION  08/27/2011   Procedure: ANTERIOR CERVICAL DECOMPRESSION/DISCECTOMY FUSION 1 LEVEL/HARDWARE REMOVAL;  Surgeon: Eustace Moore, MD;  Location: [facility];  Service: Neurosurgery;  Laterality: Bilateral;  Cervical four-five Anterior cervical decompression/diskectomy, fusion, Plate, Removal of Cervical five-seven Plate  . back injection    . CARDIAC CATHETERIZATION N/A 11/09/2014   Procedure: Right Heart Cath;  Surgeon: Larey Dresser, MD;  Location: [facility];  Service: Cardiovascular;  Laterality: N/A;  . CARPAL TUNNEL RELEASE  20110 rt/lt  . HEMORRHOID SURGERY    . MULTIPLE TOOTH EXTRACTIONS    . NECK SURGERY  2009  . PITUITARY SURGERY     Had gland removed from producing too much calcium  . polp removed  2011  . SHOULDER ARTHROSCOPY WITH ROTATOR CUFF REPAIR Right 05/23/2015   Procedure: RIGHT SHOULDER ARTHROSCOPY WITH REMOVAL OF SUTURE ANCHOR AND POSSIBLE REVISION ROTATOR CUFF REPAIR;  Surgeon: Justice Britain, MD;  Location: [place];  Service: Orthopedics;  Laterality: Right;  . SHOULDER ARTHROSCOPY WITH SUBACROMIAL DECOMPRESSION Right 01/24/2015   Procedure: RIGHT SHOULDER ARTHROSCOPY WITH SUBACROMIAL DECOMPRESSION AD DISTAL CLAVICLE RESECTION ;  Surgeon: Justice Britain, MD;  Location: [place];  Service: Orthopedics;  Laterality: Right;  Marland Kitchen VAGINAL DELIVERY     x3    OB History    No data available       Home Medications    Prior to Admission medications   Medication Sig Start Date End Date Taking? Authorizing Provider  acetaminophen (TYLENOL) 325 MG tablet Take 2 tablets (650 mg total) by mouth every 6 (six) hours as needed. Patient not taking: Reported on 12/01/2015 08/10/15   Waynetta Pean, PA-C  amLODipine  (NORVASC) 5 MG tablet Take 5 mg by mouth daily.    Historical Provider, MD  aspirin EC 81 MG EC tablet Take 1 tablet (81 mg total) by mouth daily. 06/23/14   Orson Eva, MD  atorvastatin (LIPITOR) 10 MG tablet Take 10 mg by mouth daily.    Historical Provider, MD  Azelastine HCl 0.15 % SOLN Place 1 spray into both nostrils 2 (two) times daily. 07/22/15   Adelina Mings, MD  budesonide-formoterol [facility]) 160-4.5 MCG/ACT inhaler Inhale 2 puffs into the lungs 2 (two) times daily as needed (for shortness of breath and wheezing).     Historical Provider, MD  cholecalciferol (VITAMIN D) 1000 UNITS tablet Take 1,000 Units by mouth daily.    Historical Provider, MD  diazepam (VALIUM) 5 MG tablet Take 2 tablets (10 mg total) by mouth 2 (two) times daily. 10/25/15   Norma Fredrickson, MD  docusate sodium (COLACE) 100 MG capsule Take 100 mg by mouth daily as needed for mild constipation. Reported on 07/22/2015    Historical Provider, MD  folic acid (FOLVITE) 1 MG tablet Take 1 mg by mouth daily.    Historical Provider, MD  Levomilnacipran HCl ER 40 MG CP24 Take 40 mg by mouth daily. 10/25/15   Norma Fredrickson, MD  lidocaine (XYLOCAINE) 2 % solution Use as directed 15  mLs in the mouth or throat every 6 (six) hours as needed for mouth pain. 01/03/16   Roxanna Mew, PA-C  Linaclotide [facility]) 145 MCG CAPS capsule Take 145 mcg by mouth daily as needed (constipation).     Historical Provider, MD  loratadine (CLARITIN) 10 MG tablet Take 1 tablet (10 mg total) by mouth daily. Patient not taking: Reported on 12/01/2015 03/21/15   Mercedes Camprubi-Soms, PA-C  methocarbamol (ROBAXIN) 500 MG tablet Take 1 tablet (500 mg total) by mouth 2 (two) times daily. 01/03/16   Roxanna Mew, PA-C  metoCLOPramide (REGLAN) 10 MG tablet Take 1 tablet (10 mg total) by mouth every 6 (six) hours as needed for nausea (nausea/headache). 03/21/15   Mercedes Camprubi-Soms, PA-C  Multiple Vitamins-Minerals (MULTIVITAMIN WITH  MINERALS) tablet Take 1 tablet by mouth every morning.     Historical Provider, MD  nebivolol (BYSTOLIC) 10 MG tablet Take 1 tablet (10 mg total) by mouth daily. 08/14/14   Tammy S Parrett, NP  nicotine (NICOTROL) 10 MG inhaler Inhale 1 continuous puffing into the lungs daily as needed for smoking cessation.     Historical Provider, MD  nortriptyline (PAMELOR) 25 MG capsule Take 2 capsules (50 mg total) by mouth at bedtime. 2  qhs Patient taking differently: Take 50 mg by mouth at bedtime.  10/25/15   Norma Fredrickson, MD  ondansetron (ZOFRAN) 4 MG tablet Take 1 tablet (4 mg total) by mouth every 8 (eight) hours as needed for nausea or vomiting. 05/23/15   Olivia Mackie Shuford, PA-C  Oxycodone HCl 10 MG TABS Take 0.5-1 tablets (5-10 mg total) by mouth every 4 (four) hours as needed. Patient not taking: Reported on 12/01/2015 05/23/15   Jenetta Loges, PA-C  oxyCODONE-acetaminophen (PERCOCET) 7.5-325 MG tablet TAKE 1 TABLET BY MOUTH 4 TIMES A DAY AS NEEDED for PAIN 11/15/15   Historical Provider, MD  pantoprazole (PROTONIX) 40 MG tablet Take 40 mg by mouth daily before breakfast.  06/25/14   Historical Provider, MD  potassium chloride SA (K-DUR,KLOR-CON) 20 MEQ tablet Take 20 mEq by mouth daily.     Historical Provider, MD  QUEtiapine (SEROQUEL) 25 MG tablet Take 1 tablet (25 mg total) by mouth 2 (two) times daily. 12/03/15 12/02/16  Norma Fredrickson, MD  sucralfate (CARAFATE) 1 G tablet Take 1 tablet (1 g total) by mouth 3 (three) times daily with meals. 06/24/14   Orson Eva, MD  triamterene-hydrochlorothiazide (DYAZIDE) 50-25 MG capsule Take 1 capsule by mouth every morning.    Historical Provider, MD  valACYclovir (VALTREX) 1000 MG tablet Take 1,000 mg by mouth daily.     Historical Provider, MD  vitamin B-12 (CYANOCOBALAMIN) 1000 MCG tablet Take 1,000 mcg by mouth daily.    Historical Provider, MD  zolpidem (AMBIEN CR) 12.5 MG CR tablet Take 1 tablet (12.5 mg total) by mouth at bedtime as needed for sleep. 03/20/15    Norma Fredrickson, MD    Family History Family History  Problem Relation Age of Onset  . Coronary artery disease Father   . Cancer Father     head neck   . Hypertension Mother   . Schizophrenia Mother   . Depression Brother   . Prostate cancer Brother   . Anesthesia problems Neg Hx   . Hypotension Neg Hx   . Malignant hyperthermia Neg Hx   . Pseudochol deficiency Neg Hx   . Allergic rhinitis Neg Hx   . Angioedema Neg Hx   . Asthma Neg Hx   . Atopy Neg Hx   .  Eczema Neg Hx   . Immunodeficiency Neg Hx   . Urticaria Neg Hx     Social History Social History  Substance Use Topics  . Smoking status: Current Some Day Smoker    Packs/day: 0.10    Years: 30.00    Types: Cigarettes  . Smokeless tobacco: Not on file     Comment: using nicotrol inhaler  . Alcohol use No     Allergies   Aspirin; Effexor [venlafaxine hydrochloride]; Latex; Penicillins; Zithromax [azithromycin dihydrate]; Atorvastatin; Codeine; Chantix [varenicline tartrate]; Paroxetine hcl; and Tramadol   Review of Systems Review of Systems  Constitutional: Negative for fever.  HENT: Positive for mouth sores ( corners of mouth). Negative for facial swelling and trouble swallowing.   Eyes: Negative for photophobia and visual disturbance.  Respiratory: Negative for shortness of breath.   Cardiovascular: Negative for chest pain.  Gastrointestinal: Negative for abdominal pain, nausea and vomiting.  Genitourinary:       No loss of bowel or bladder control.  Musculoskeletal: Positive for arthralgias, myalgias and neck pain ( chronic). Negative for joint swelling.  Skin: Negative for color change.  Neurological: Negative for weakness and numbness.     Physical Exam Updated Vital Signs BP 121/82 (BP Location: Right Arm)   Pulse (!) 56   Temp 98.3 F (36.8 C) (Oral)   Resp 16   Ht 5\' 7"  (1.702 m)   Wt 102.1 kg   SpO2 100%   BMI 35.24 kg/m   Physical Exam  Constitutional: She appears well-developed and  well-nourished. No distress.  HENT:  Head: Normocephalic and atraumatic.  Mouth/Throat: Uvula is midline, oropharynx is clear and moist and mucous membranes are normal. No oral lesions. No trismus in the jaw.  No trismus. Angular cheilitis b/l. No oral lesions, vesicles, or ulcerations. Uvula midline. No sublingual swelling. Managing oral secretions.   Eyes: Conjunctivae and EOM are normal. Pupils are equal, round, and reactive to light. Right eye exhibits no discharge. Left eye exhibits no discharge. No scleral icterus.  Neck: Normal range of motion and phonation normal. Neck supple. No neck rigidity. Normal range of motion present.  No nuchal rigidity.   Cardiovascular: Normal rate, regular rhythm, normal heart sounds and intact distal pulses.   No murmur heard. Pulmonary/Chest: Effort normal and breath sounds normal. No stridor. No respiratory distress. She has no wheezes. She has no rales.  Abdominal: Soft. Bowel sounds are normal. She exhibits no distension. There is no tenderness. There is no rigidity, no rebound, no guarding and no CVA tenderness.  Musculoskeletal: Normal range of motion. She exhibits tenderness.       Right shoulder: She exhibits tenderness.       Left shoulder: She exhibits tenderness.  No obvious deformity, color change, or erythema of shoulders noted b/l. TTP of AC joint b/l. TTP of trapezius muscle b/l. No cervical spine tenderness. Neck ROM intact. Shoulder ROM intact; however, painful. Strength and sensation intact. Radial pulses 2+ b/l.   Lymphadenopathy:    She has no cervical adenopathy.  Neurological: She is alert. She has normal strength. She is not disoriented. No sensory deficit. Coordination and gait normal. GCS eye subscore is 4. GCS verbal subscore is 5. GCS motor subscore is 6.  Mental Status:  Alert, thought content appropriate, able to give a coherent history. Speech fluent without evidence of aphasia. Able to follow 2 step commands without difficulty.    Cranial Nerves:  II: pupils equal, round, reactive to light III,IV, VI: ptosis not present,  extra-ocular motions intact bilaterally  V,VII: smile symmetric, facial light touch sensation equal VIII: hearing grossly normal to voice  IX, X: uvula elevates symmetrically  XI: bilateral shoulder shrug symmetric and strong XII: midline tongue extension without fassiculations  Skin: Skin is warm and dry. She is not diaphoretic.  Psychiatric: She has a normal mood and affect. Her behavior is normal.  Nursing note and vitals reviewed.    ED Treatments / Results  DIAGNOSTIC STUDIES: Oxygen Saturation is 100% on RA, normal by my interpretation.    COORDINATION OF CARE: 4:04 PM Discussed treatment plan with pt at bedside and pt agreed to plan.   Labs (all labs ordered are listed, but only abnormal results are displayed) Labs Reviewed - No data to display  EKG  EKG Interpretation None       Radiology Dg Shoulder Right  Result Date: 01/03/2016 CLINICAL DATA:  Constant bilateral shoulder pain. Pain is chronic on the right and more acute on the left. No acute injury. EXAM: RIGHT SHOULDER - 2+ VIEW COMPARISON:  08/10/2015 and 04/27/2015 radiographs. FINDINGS: The mineralization alignment are normal. There is no evidence of acute fracture or dislocation. There is stable mild glenohumeral degenerative changes. There are stable probable postsurgical changes from previous subacromial decompression. Surgical clips overlie the right lung apex. IMPRESSION: No acute findings. Stable mild glenohumeral degenerative changes and probable postsurgical changes. Electronically Signed   By: Richardean Sale M.D.   On: 01/03/2016 16:49   Dg Shoulder Left  Result Date: 01/03/2016 CLINICAL DATA:  Shoulder pain EXAM: LEFT SHOULDER - 2+ VIEW COMPARISON:  10/17/2003 FINDINGS: There is no fracture or dislocation. There are mild degenerative changes of the acromioclavicular joint. IMPRESSION: No acute osseous injury  of the left shoulder. Electronically Signed   By: Kathreen Devoid   On: 01/03/2016 16:52    Procedures Procedures (including critical care time)  Medications Ordered in ED Medications  methocarbamol (ROBAXIN) tablet 1,000 mg (1,000 mg Oral Given 01/03/16 1707)  acetaminophen (TYLENOL) tablet 650 mg (650 mg Oral Given 01/03/16 1711)     Initial Impression / Assessment and Plan / ED Course  I have reviewed the triage vital signs and the nursing notes.  Pertinent labs & imaging results that were available during my care of the patient were reviewed by me and considered in my medical decision making (see chart for details).  Clinical Course  Comment By Time  Review of x-ray show no acute fracture or dislocation at shoulder joint Roxanna Mew, PA-C 09/29 1700    Patient presents to ED with complaint of b/l shoulder pain and mouth sensitivity. Patient is afebrile and non-toxic appearing in NAD. VSS. No obvious deformity, color change, warmth noted to shoulders b/l. Mild TTP of AC joint b/l, TTP of trapezius b/l. Neck ROM intact. Shoulder ROM intact. Strength, sensation, and distal pulses intact and symmetric. X-rays negative for acute fracture or dislocation; of note, degenerative changes appreciated b/l. Discussed results and plan with patient. Given patient is part of pain management clinic will not rx narcotic medication. Patient reports robaxin and ice has helped in past. Will rx robaxin. Discussed symptomatic therapy to include tylenol/motrin and icing affected areas. Call pain clinic Monday for re-evaluation and management of chronic pain.   Regarding mouth sensitivity -  no trouble swallowing, no difficulty breathing. No trismus. Uvula midline. No oral lesions, ulcerations, vesicles, or oral swelling appreciated. No nuchal rigidity. Managing oral secretions. Angular cheilitis noted. Patient has d/c'd nucynta. Discussed symptomatic management to include  decrease smoking and vaping. Apply  barrier protection such as vaseline to lips. Rx viscous lidocaine for symptomatic relief.   Follow up with PCP as needed. Return precautions discussed. Patient voiced understanding and is agreeable.    Final Clinical Impressions(s) / ED Diagnoses   Final diagnoses:  Mouth pain  Bilateral shoulder pain    New Prescriptions Discharge Medication List as of 01/03/2016  5:14 PM    START taking these medications   Details  lidocaine (XYLOCAINE) 2 % solution Use as directed 15 mLs in the mouth or throat every 6 (six) hours as needed for mouth pain., Starting Fri 01/03/2016, Print       I personally performed the services described in this documentation, which was scribed in my presence. The recorded information has been reviewed and is accurate.    Roxanna Mew, PA-C 01/05/16 1249    Leonard Schwartz, MD 01/20/16 [phone]

## 2016-01-06 ENCOUNTER — Emergency Department (HOSPITAL_COMMUNITY)
Admission: EM | Admit: 2016-01-06 | Discharge: 2016-01-06 | Disposition: A | Discharge status: Home / Self Care | Payer: Medicaid Other | Attending: Emergency Medicine | Admitting: Emergency Medicine

## 2016-01-06 ENCOUNTER — Encounter (HOSPITAL_COMMUNITY): Payer: Self-pay | Admitting: Nurse Practitioner

## 2016-01-06 DIAGNOSIS — Z7982 Long term (current) use of aspirin: Secondary | ICD-10-CM | POA: Insufficient documentation

## 2016-01-06 DIAGNOSIS — F17210 Nicotine dependence, cigarettes, uncomplicated: Secondary | ICD-10-CM | POA: Insufficient documentation

## 2016-01-06 DIAGNOSIS — K120 Recurrent oral aphthae: Secondary | ICD-10-CM | POA: Insufficient documentation

## 2016-01-06 DIAGNOSIS — M25512 Pain in left shoulder: Secondary | ICD-10-CM | POA: Insufficient documentation

## 2016-01-06 DIAGNOSIS — K1379 Other lesions of oral mucosa: Secondary | ICD-10-CM | POA: Diagnosis present

## 2016-01-06 DIAGNOSIS — Z91040 Latex allergy status: Secondary | ICD-10-CM | POA: Diagnosis not present

## 2016-01-06 DIAGNOSIS — G8929 Other chronic pain: Secondary | ICD-10-CM | POA: Diagnosis not present

## 2016-01-06 DIAGNOSIS — Z79899 Other long term (current) drug therapy: Secondary | ICD-10-CM | POA: Insufficient documentation

## 2016-01-06 DIAGNOSIS — I10 Essential (primary) hypertension: Secondary | ICD-10-CM | POA: Diagnosis not present

## 2016-01-06 DIAGNOSIS — J449 Chronic obstructive pulmonary disease, unspecified: Secondary | ICD-10-CM | POA: Diagnosis not present

## 2016-01-06 DIAGNOSIS — M25511 Pain in right shoulder: Secondary | ICD-10-CM | POA: Insufficient documentation

## 2016-01-06 MED ORDER — NAPROXEN 500 MG PO TABS
500.0000 mg | ORAL_TABLET | Freq: Two times a day (BID) | ORAL | 0 refills | Status: DC
Start: 2016-01-06 — End: 2017-06-30

## 2016-01-06 MED ORDER — LIDOCAINE VISCOUS 2 % MT SOLN
15.0000 mL | Freq: Four times a day (QID) | OROMUCOSAL | 0 refills | Status: DC | PRN
Start: 2016-01-06 — End: 2016-01-06

## 2016-01-06 MED ORDER — LIDOCAINE VISCOUS 2 % MT SOLN
15.0000 mL | Freq: Four times a day (QID) | OROMUCOSAL | 0 refills | Status: DC | PRN
Start: 2016-01-06 — End: 2017-06-30

## 2016-01-06 MED ORDER — NAPROXEN 500 MG PO TABS: 500.0000 mg | ORAL_TABLET | Freq: Two times a day (BID) | ORAL | 0 refills | Status: DC

## 2016-01-06 NOTE — ED Triage Notes (Signed)
Pt presents with c/o mouth pain and bilateral shoulder pain. The pain began 2 weeks ago after she began taking a new medication for pain. She was here on Friday for these complaints, she was discharge with mouth wash and muscle relaxer which she has been trying but the pain is getting worse. She tried to get an appt with her PCP and her pain management doctor but they have not called her back yet so she decided to come back to ED because her pain is unbearable. She is alert and breathing easily.

## 2016-01-07 NOTE — ED Provider Notes (Signed)
[facility] DEPT Provider Note   CSN: [id number] Arrival date & time: 01/06/16  1707     History   Chief Complaint Chief Complaint  Patient presents with  . Oral Pain  . Shoulder Pain    HPI Gloria Lewis is a 60 y.o. female.  Patient is 60 yo F with hx of chronic pain, presenting to ED with chief complaint of "mouth sore" starting 2 weeks ago. Also reports bilateral shoulder pain, described as throbbing, with no recent trauma or loss of function. States she was prescribed Nucynta 1 month ago by pain management doctor, she researched the drug and determined it had similar properties as Tramadol (which she is allergic to), and developed mouth sores soon after. She states the pain is constant, she cannot eat or drink, and nothing has relieved the pain. Was evaluated in ED on Friday 9/29 for similar mouth pain and bilateral shoulder pain, given Robaxin, and sent home with prescription Xylocaine 2% solution. Patient requesting refill of solution and something for pain. Denies fever, chills, difficulty swallowing, oral swelling, or shortness of breath.      Past Medical History:  Diagnosis Date  . Anginal pain ([place])    admit 06/2014; had non-ischemic stress test  . Anxiety   . Bipolar 1 disorder ([place])   . Colon polyp   . CTS (carpal tunnel syndrome)   . Depression   . Fever blister   . GERD (gastroesophageal reflux disease)   . HA (headache)   . HTN (hypertension)   . Hypercholesterolemia   . Migraines   . OA (osteoarthritis)   . Schizo-affective psychosis [facility])     Patient Active Problem List   Diagnosis Date Noted  . Chronic rhinitis 07/22/2015  . Surgery, elective 05/23/2015  . S/P arthroscopy of shoulder 05/23/2015  . Chronic migraine without aura without status migrainosus, not intractable 10/18/2014  . Tobacco abuse 10/18/2014  . Obesity 09/23/2014  . COPD 09/02/2014  . Pulmonary hypertension 08/31/2014  . Respiratory failure with hypoxia ([place]) 08/14/2014    . Cigarette smoker 07/28/2014  . Major depressive disorder, recurrent episode, moderate ([place]) 07/06/2014  . Essential hypertension   . SOB (shortness of breath) 06/21/2014  . Precordial pain 06/21/2014  . GERD (gastroesophageal reflux disease) 06/21/2014  . Chest pain 06/21/2014  . HTN (hypertension)   . Neck pain 01/08/2014  . Major depressive disorder, recurrent episode, severe, without mention of psychotic behavior 10/14/2012  . Schizoaffective disorder ([place]) 05/26/2012  . Parathyroid adenoma 10/06/2010  . Hyperparathyroidism, primary ([place]) 10/06/2010  . DEGENERATIVE DISC DISEASE, LUMBOSACRAL SPINE 05/21/2007  . DERMATOPHYTOSIS OF THE BODY 05/10/2007  . 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 Anxiety state 03/24/2007  . DENTAL PAIN 03/24/2007  . SHOULDER PAIN, LEFT 03/24/2007    Past Surgical History:  Procedure Laterality Date  . ANTERIOR CERVICAL DECOMP/DISCECTOMY FUSION  08/27/2011   Procedure: ANTERIOR CERVICAL DECOMPRESSION/DISCECTOMY FUSION 1 LEVEL/HARDWARE REMOVAL;  Surgeon: Eustace Moore, MD;  Location: [facility];  Service: Neurosurgery;  Laterality: Bilateral;  Cervical four-five Anterior cervical decompression/diskectomy, fusion, Plate, Removal of Cervical five-seven Plate  . back injection    . CARDIAC CATHETERIZATION N/A 11/09/2014   Procedure: Right Heart Cath;  Surgeon: Larey Dresser, MD;  Location: [facility] CV LAB;  Service: Cardiovascular;  Laterality: N/A;  . CARPAL TUNNEL RELEASE  20110 rt/lt  . HEMORRHOID SURGERY    . MULTIPLE TOOTH EXTRACTIONS    . NECK SURGERY  2009  . PITUITARY SURGERY  Had gland removed from producing too much calcium  . polp removed  2011  . SHOULDER ARTHROSCOPY WITH ROTATOR CUFF REPAIR Right 05/23/2015   Procedure: RIGHT SHOULDER ARTHROSCOPY WITH REMOVAL OF SUTURE ANCHOR AND POSSIBLE REVISION ROTATOR CUFF REPAIR;  Surgeon: Justice Britain, MD;  Location: [facility];  Service: Orthopedics;  Laterality: Right;  . SHOULDER  ARTHROSCOPY WITH SUBACROMIAL DECOMPRESSION Right 01/24/2015   Procedure: RIGHT SHOULDER ARTHROSCOPY WITH SUBACROMIAL DECOMPRESSION AD DISTAL CLAVICLE RESECTION ;  Surgeon: Justice Britain, MD;  Location: [facility];  Service: Orthopedics;  Laterality: Right;  Marland Kitchen VAGINAL DELIVERY     x3    OB History    No data available       Home Medications    Prior to Admission medications   Medication Sig Start Date End Date Taking? Authorizing Provider  acetaminophen (TYLENOL) 325 MG tablet Take 2 tablets (650 mg total) by mouth every 6 (six) hours as needed. Patient not taking: Reported on 12/01/2015 08/10/15   Waynetta Pean, PA-C  amLODipine (NORVASC) 5 MG tablet Take 5 mg by mouth daily.    Historical Provider, MD  aspirin EC 81 MG EC tablet Take 1 tablet (81 mg total) by mouth daily. 06/23/14   Orson Eva, MD  atorvastatin (LIPITOR) 10 MG tablet Take 10 mg by mouth daily.    Historical Provider, MD  Azelastine HCl 0.15 % SOLN Place 1 spray into both nostrils 2 (two) times daily. 07/22/15   Adelina Mings, MD  budesonide-formoterol [facility]) 160-4.5 MCG/ACT inhaler Inhale 2 puffs into the lungs 2 (two) times daily as needed (for shortness of breath and wheezing).     Historical Provider, MD  cholecalciferol (VITAMIN D) 1000 UNITS tablet Take 1,000 Units by mouth daily.    Historical Provider, MD  diazepam (VALIUM) 5 MG tablet Take 2 tablets (10 mg total) by mouth 2 (two) times daily. 10/25/15   Norma Fredrickson, MD  docusate sodium (COLACE) 100 MG capsule Take 100 mg by mouth daily as needed for mild constipation. Reported on 07/22/2015    Historical Provider, MD  folic acid (FOLVITE) 1 MG tablet Take 1 mg by mouth daily.    Historical Provider, MD  Levomilnacipran HCl ER 40 MG CP24 Take 40 mg by mouth daily. 10/25/15   Norma Fredrickson, MD  lidocaine (XYLOCAINE) 2 % solution Use as directed 15 mLs in the mouth or throat every 6 (six) hours as needed for mouth pain. 01/06/16   Carlisle Cater, PA-C  Linaclotide  (LINZESS) 145 MCG CAPS capsule Take 145 mcg by mouth daily as needed (constipation).     Historical Provider, MD  loratadine (CLARITIN) 10 MG tablet Take 1 tablet (10 mg total) by mouth daily. Patient not taking: Reported on 12/01/2015 03/21/15   Mercedes Camprubi-Soms, PA-C  methocarbamol (ROBAXIN) 500 MG tablet Take 1 tablet (500 mg total) by mouth 2 (two) times daily. 01/03/16   Roxanna Mew, PA-C  metoCLOPramide (REGLAN) 10 MG tablet Take 1 tablet (10 mg total) by mouth every 6 (six) hours as needed for nausea (nausea/headache). 03/21/15   Mercedes Camprubi-Soms, PA-C  Multiple Vitamins-Minerals (MULTIVITAMIN WITH MINERALS) tablet Take 1 tablet by mouth every morning.     Historical Provider, MD  naproxen (NAPROSYN) 500 MG tablet Take 1 tablet (500 mg total) by mouth 2 (two) times daily. 01/06/16   Carlisle Cater, PA-C  nebivolol (BYSTOLIC) 10 MG tablet Take 1 tablet (10 mg total) by mouth daily. 08/14/14   Tammy S Parrett, NP  nicotine (NICOTROL) 10  MG inhaler Inhale 1 continuous puffing into the lungs daily as needed for smoking cessation.     Historical Provider, MD  nortriptyline (PAMELOR) 25 MG capsule Take 2 capsules (50 mg total) by mouth at bedtime. 2  qhs Patient taking differently: Take 50 mg by mouth at bedtime.  10/25/15   Norma Fredrickson, MD  ondansetron (ZOFRAN) 4 MG tablet Take 1 tablet (4 mg total) by mouth every 8 (eight) hours as needed for nausea or vomiting. 05/23/15   Olivia Mackie Shuford, PA-C  Oxycodone HCl 10 MG TABS Take 0.5-1 tablets (5-10 mg total) by mouth every 4 (four) hours as needed. Patient not taking: Reported on 12/01/2015 05/23/15   Jenetta Loges, PA-C  oxyCODONE-acetaminophen (PERCOCET) 7.5-325 MG tablet TAKE 1 TABLET BY MOUTH 4 TIMES A DAY AS NEEDED for PAIN 11/15/15   Historical Provider, MD  pantoprazole (PROTONIX) 40 MG tablet Take 40 mg by mouth daily before breakfast.  06/25/14   Historical Provider, MD  potassium chloride SA (K-DUR,KLOR-CON) 20 MEQ tablet Take 20  mEq by mouth daily.     Historical Provider, MD  QUEtiapine (SEROQUEL) 25 MG tablet Take 1 tablet (25 mg total) by mouth 2 (two) times daily. 12/03/15 12/02/16  Norma Fredrickson, MD  sucralfate (CARAFATE) 1 G tablet Take 1 tablet (1 g total) by mouth 3 (three) times daily with meals. 06/24/14   Orson Eva, MD  triamterene-hydrochlorothiazide (DYAZIDE) 50-25 MG capsule Take 1 capsule by mouth every morning.    Historical Provider, MD  valACYclovir (VALTREX) 1000 MG tablet Take 1,000 mg by mouth daily.     Historical Provider, MD  vitamin B-12 (CYANOCOBALAMIN) 1000 MCG tablet Take 1,000 mcg by mouth daily.    Historical Provider, MD  zolpidem (AMBIEN CR) 12.5 MG CR tablet Take 1 tablet (12.5 mg total) by mouth at bedtime as needed for sleep. 03/20/15   Norma Fredrickson, MD    Family History Family History  Problem Relation Age of Onset  . Coronary artery disease Father   . Cancer Father     head neck   . Hypertension Mother   . Schizophrenia Mother   . Depression Brother   . Prostate cancer Brother   . Anesthesia problems Neg Hx   . Hypotension Neg Hx   . Malignant hyperthermia Neg Hx   . Pseudochol deficiency Neg Hx   . Allergic rhinitis Neg Hx   . Angioedema Neg Hx   . Asthma Neg Hx   . Atopy Neg Hx   . Eczema Neg Hx   . Immunodeficiency Neg Hx   . Urticaria Neg Hx     Social History Social History  Substance Use Topics  . Smoking status: Current Some Day Smoker    Packs/day: 0.10    Years: 30.00    Types: Cigarettes  . Smokeless tobacco: Not on file     Comment: using nicotrol inhaler  . Alcohol use No     Allergies   Aspirin; Effexor [venlafaxine hydrochloride]; Latex; Penicillins; Zithromax [azithromycin dihydrate]; Atorvastatin; Codeine; Chantix [varenicline tartrate]; Paroxetine hcl; and Tramadol   Review of Systems Review of Systems  Constitutional: Negative for chills and fever.  HENT: Positive for mouth sores. Negative for dental problem, drooling, ear pain, facial  swelling, sore throat, trouble swallowing and voice change.   Eyes: Negative for pain and visual disturbance.  Respiratory: Negative for cough, choking, shortness of breath, wheezing and stridor.   Cardiovascular: Negative for chest pain and palpitations.  Gastrointestinal: Negative for abdominal pain, nausea and  vomiting.  Genitourinary: Negative for dysuria and hematuria.  Musculoskeletal: Positive for back pain (chronic) and neck pain (chronic). Negative for gait problem and joint swelling.  Skin: Negative for color change and rash.  Neurological: Negative for dizziness, weakness, numbness and headaches.     Physical Exam Updated Vital Signs BP 125/75 (BP Location: Left Arm)   Pulse (!) 56   Temp 98.7 F (37.1 C) (Oral)   Resp 16   SpO2 98%   Physical Exam  Constitutional:  Obese female, sitting in chair in no acute distress, drinking water and handling secretions without difficulty  HENT:  Head: Normocephalic and atraumatic.  Mouth/Throat: Uvula is midline, oropharynx is clear and moist and mucous membranes are normal. No trismus in the jaw. No uvula swelling. No oropharyngeal exudate, posterior oropharyngeal edema, posterior oropharyngeal erythema or tonsillar abscesses.  Two 1 mm white, shallow, ulcerated lesions noted to right buccal mucosa. No other lesions, evidence of infection or abscess, or sublingual swelling  Eyes: Conjunctivae are normal.  Neck: Normal range of motion. Neck supple.  Cardiovascular: Normal rate.   Pulmonary/Chest: Effort normal. No respiratory distress.  Musculoskeletal: Normal range of motion. She exhibits tenderness (Mild tenderness with active and passive ROM bilateral shoulders). She exhibits no edema or deformity.  No body tenderness, crepitus, or obvious deformity noted to bilateral shoulders  Lymphadenopathy:    She has no cervical adenopathy.  Neurological: She is alert. She has normal strength. She displays no atrophy and no tremor. No  sensory deficit. She exhibits normal muscle tone. Coordination and gait normal.  Skin: Skin is warm and dry. No rash noted.  Nursing note and vitals reviewed.    ED Treatments / Results  Labs (all labs ordered are listed, but only abnormal results are displayed) Labs Reviewed - No data to display  EKG  EKG Interpretation None       Radiology No results found.  Procedures Procedures (including critical care time)  Medications Ordered in ED Medications - No data to display   Initial Impression / Assessment and Plan / ED Course  I have reviewed the triage vital signs and the nursing notes.  Pertinent labs & imaging results that were available during my care of the patient were reviewed by me and considered in my medical decision making (see chart for details).  Clinical Course   Patient is 60 yo F with hx of chronic pain, presenting with chief complaint of painful mouth sore and bilateral shoulder pain. Evaluated in ED on Friday 9/29 for similar complaint, given Robaxin, and sent home with prescription Xylocaine 2% solution. On physical exam, she is non-toxic, drinking water/handling secretions, and oropharynx is clear and moist. Two 1 mm aphthous ulcers noted to right buccal mucosa, but no other signs of infection, abscess, or acute pathology. Exam of bilateral shoulders unremarkable other than mild pain with active and passive ROM. Patient is stable for d/c home, with refill of Xylocaine solution and prescription Naprosyn for pain. Advised to f/u with PCP and pain management, or return to ED for new or worsening symptoms.  Final Clinical Impressions(s) / ED Diagnoses   Final diagnoses:  Aphthous ulcer  Chronic pain of both shoulders    New Prescriptions Discharge Medication List as of 01/06/2016  8:20 PM    START taking these medications   Details  naproxen (NAPROSYN) 500 MG tablet Take 1 tablet (500 mg total) by mouth 2 (two) times daily., Starting Mon 01/06/2016, Print  Gloria Lewis, Gloria Lewis 01/07/16 1615    Gloria Boss, MD 01/09/16 [phone]

## 2016-01-10 ENCOUNTER — Encounter (HOSPITAL_COMMUNITY): Payer: Self-pay | Admitting: Psychiatry

## 2016-01-10 ENCOUNTER — Ambulatory Visit (INDEPENDENT_AMBULATORY_CARE_PROVIDER_SITE_OTHER): Payer: Self-pay | Admitting: Psychiatry

## 2016-01-10 VITALS — BP 128/70 | HR 72 | Ht 67.0 in | Wt 219.6 lb

## 2016-01-10 DIAGNOSIS — F339 Major depressive disorder, recurrent, unspecified: Secondary | ICD-10-CM

## 2016-01-10 MED ORDER — LEVOMILNACIPRAN HCL ER 40 MG PO CP24: 40.0000 mg | ORAL_CAPSULE | Freq: Every day | ORAL | 5 refills | Status: DC

## 2016-01-10 MED ORDER — NORTRIPTYLINE HCL 25 MG PO CAPS: 50.0000 mg | ORAL_CAPSULE | Freq: Every day | ORAL | 8 refills | Status: DC

## 2016-01-10 MED ORDER — DIAZEPAM 5 MG PO TABS: 10.0000 mg | ORAL_TABLET | Freq: Two times a day (BID) | ORAL | 4 refills | Status: DC

## 2016-01-10 MED ORDER — QUETIAPINE FUMARATE 25 MG PO TABS: ORAL_TABLET | ORAL | 3 refills | Status: DC

## 2016-01-10 MED ORDER — ZOLPIDEM TARTRATE ER 12.5 MG PO TBCR: 12.5000 mg | EXTENDED_RELEASE_TABLET | Freq: Every evening | ORAL | 5 refills | Status: DC | PRN

## 2016-01-10 NOTE — Progress Notes (Signed)
Patient ID: Gloria Lewis, female   DOB: 1956/02/22, 60 y.o.   MRN: [id number] [facility] MD Progress Note  01/10/2016 10:18 AM Gloria Lewis  MRN:  [id number] Subjective:  Shoulder hurting Principal Problem: Major Depression,recurent Mild Diagnosis: Major Depression, Recurent The patient is seen on her own. She is on time. Patient is very frustrated. He's frustrated with this system. Her son is recently released from prison but didn't get a chance to go to the mental health Court. Therefore he has no significant psychiatric care. He is chronically mentally ill. She's had an IVC number of times since I've seen her. The patient other son is going into the [employer]. The patient some ways is doing much better. She no longer has bladder surgery issues and her uterus is doing well. Her GYN problems have abated. She continues to have shoulder pain and has just been put on a: Fentanyl patch. The patient denies persistent daily depression. She denies mania. She's having a reactive irritability at this system. She sleeping and eating well. She actually is losing a little weight vest because she's taking medicines for her migraines. Patient denies any auditory or visual hallucinations. She denies any paranoia. The patient does not drink any alcohol use clean of drugs. Patient Active Problem List   Diagnosis Date Noted  . Chronic rhinitis [J31.0] 07/22/2015  . Surgery, elective [Z41.9] 05/23/2015  . S/P arthroscopy of shoulder [Z98.890] 05/23/2015  . Chronic migraine without aura without status migrainosus, not intractable [G43.709] 10/18/2014  . Tobacco abuse [Z72.0] 10/18/2014  . Obesity [E66.9] 09/23/2014  . COPD [J44.9] 09/02/2014  . Pulmonary hypertension [I27.20] 08/31/2014  . Respiratory failure with hypoxia ([place]) [J96.91] 08/14/2014  . Cigarette smoker [F17.210] 07/28/2014  . Major depressive disorder, recurrent episode, moderate ([place]) [F33.1] 07/06/2014  . Essential hypertension [I10]   . SOB  (shortness of breath) [R06.02] 06/21/2014  . Precordial pain [R07.2] 06/21/2014  . GERD (gastroesophageal reflux disease) [K21.9] 06/21/2014  . Chest pain [R07.9] 06/21/2014  . HTN (hypertension) [I10]   . Neck pain [M54.2] 01/08/2014  . Major depressive disorder, recurrent episode, severe, without mention of psychotic behavior [F33.2] 10/14/2012  . Schizoaffective disorder ([place]) [F25.9] 05/26/2012  . 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 Hyperparathyroidism, primary ([place]) [E21.0] 10/06/2010  . DEGENERATIVE DISC DISEASE, LUMBOSACRAL SPINE [M51.37] 05/21/2007  . DERMATOPHYTOSIS OF THE BODY [B35.4] 05/10/2007  . 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 Anxiety state [F41.1] 03/24/2007  . DENTAL PAIN [K08.9] 03/24/2007  . SHOULDER PAIN, LEFT [M25.519] 03/24/2007   Total Time spent with patient:30 min  Past Psychiatric History:   Past Medical History:  Past Medical History:  Diagnosis Date  . Anginal pain ([place])    admit 06/2014; had non-ischemic stress test  . Anxiety   . Bipolar 1 disorder ([place])   . Colon polyp   . CTS (carpal tunnel syndrome)   . Depression   . Fever blister   . GERD (gastroesophageal reflux disease)   . HA (headache)   . HTN (hypertension)   . Hypercholesterolemia   . Migraines   . OA (osteoarthritis)   . Schizo-affective psychosis ([place])     Past Surgical History:  Procedure Laterality Date  . ANTERIOR CERVICAL DECOMP/DISCECTOMY FUSION  08/27/2011   Procedure: ANTERIOR CERVICAL DECOMPRESSION/DISCECTOMY FUSION 1 LEVEL/HARDWARE REMOVAL;  Surgeon: Eustace Moore, MD;  Location: [facility];  Service: Neurosurgery;  Laterality: Bilateral;  Cervical four-five Anterior cervical decompression/diskectomy, fusion, Plate, Removal of Cervical five-seven Plate  . back injection    .  CARDIAC CATHETERIZATION N/A 11/09/2014   Procedure: Right Heart Cath;  Surgeon: Larey Dresser, MD;  Location: [facility];  Service: Cardiovascular;  Laterality:  N/A;  . CARPAL TUNNEL RELEASE  20110 rt/lt  . HEMORRHOID SURGERY    . MULTIPLE TOOTH EXTRACTIONS    . NECK SURGERY  2009  . PITUITARY SURGERY     Had gland removed from producing too much calcium  . polp removed  2011  . SHOULDER ARTHROSCOPY WITH ROTATOR CUFF REPAIR Right 05/23/2015   Procedure: RIGHT SHOULDER ARTHROSCOPY WITH REMOVAL OF SUTURE ANCHOR AND POSSIBLE REVISION ROTATOR CUFF REPAIR;  Surgeon: Justice Britain, MD;  Location: [facility];  Service: Orthopedics;  Laterality: Right;  . SHOULDER ARTHROSCOPY WITH SUBACROMIAL DECOMPRESSION Right 01/24/2015   Procedure: RIGHT SHOULDER ARTHROSCOPY WITH SUBACROMIAL DECOMPRESSION AD DISTAL CLAVICLE RESECTION ;  Surgeon: Justice Britain, MD;  Location: [facility];  Service: Orthopedics;  Laterality: Right;  Marland Kitchen VAGINAL DELIVERY     x3   Family History:  Family History  Problem Relation Age of Onset  . Coronary artery disease Father   . Cancer Father     head neck   . Hypertension Mother   . Schizophrenia Mother   . Depression Brother   . Prostate cancer Brother   . Anesthesia problems Neg Hx   . Hypotension Neg Hx   . Malignant hyperthermia Neg Hx   . Pseudochol deficiency Neg Hx   . Allergic rhinitis Neg Hx   . Angioedema Neg Hx   . Asthma Neg Hx   . Atopy Neg Hx   . Eczema Neg Hx   . Immunodeficiency Neg Hx   . Urticaria Neg Hx    Family Psychiatric  History:  Social History:  History  Alcohol Use No     History  Drug use: Unknown    Social History   Social History  . Marital status: Single    Spouse name: N/A  . Number of children: N/A  . Years of education: N/A   Social History Main Topics  . Smoking status: Current Some Day Smoker    Packs/day: 0.10    Years: 30.00    Types: Cigarettes  . Smokeless tobacco: None     Comment: using nicotrol inhaler  . Alcohol use No  . Drug use: Unknown  . Sexual activity: No   Other Topics Concern  . None   Social History Narrative  . None   Additional Social History:                          Sleep: Good  Appetite:  Fair  Current Medications: Current Outpatient Prescriptions  Medication Sig Dispense Refill  . acetaminophen (TYLENOL) 325 MG tablet Take 2 tablets (650 mg total) by mouth every 6 (six) hours as needed. (Patient not taking: Reported on 12/01/2015) 30 tablet 0  . amLODipine (NORVASC) 5 MG tablet Take 5 mg by mouth daily.    Marland Kitchen aspirin EC 81 MG EC tablet Take 1 tablet (81 mg total) by mouth daily. 30 tablet 0  . atorvastatin (LIPITOR) 10 MG tablet Take 10 mg by mouth daily.    . Azelastine HCl 0.15 % SOLN Place 1 spray into both nostrils 2 (two) times daily. 30 mL 5  . budesonide-formoterol (SYMBICORT) 160-4.5 MCG/ACT inhaler Inhale 2 puffs into the lungs 2 (two) times daily as needed (for shortness of breath and wheezing).     . cholecalciferol (VITAMIN D) 1000 UNITS  tablet Take 1,000 Units by mouth daily.    . diazepam (VALIUM) 5 MG tablet Take 2 tablets (10 mg total) by mouth 2 (two) times daily. 120 tablet 4  . docusate sodium (COLACE) 100 MG capsule Take 100 mg by mouth daily as needed for mild constipation. Reported on [id number]    . folic acid (FOLVITE) 1 MG tablet Take 1 mg by mouth daily.    . Levomilnacipran HCl ER 40 MG CP24 Take 40 mg by mouth daily. 30 capsule 5  . lidocaine (XYLOCAINE) 2 % solution Use as directed 15 mLs in the mouth or throat every 6 (six) hours as needed for mouth pain. 100 mL 0  . Linaclotide (LINZESS) 145 MCG CAPS capsule Take 145 mcg by mouth daily as needed (constipation).     Marland Kitchen loratadine (CLARITIN) 10 MG tablet Take 1 tablet (10 mg total) by mouth daily. (Patient not taking: Reported on 12/01/2015) 30 tablet 0  . methocarbamol (ROBAXIN) 500 MG tablet Take 1 tablet (500 mg total) by mouth 2 (two) times daily. 20 tablet 0  . metoCLOPramide (REGLAN) 10 MG tablet Take 1 tablet (10 mg total) by mouth every 6 (six) hours as needed for nausea (nausea/headache). 6 tablet 0  . Multiple Vitamins-Minerals (MULTIVITAMIN  WITH MINERALS) tablet Take 1 tablet by mouth every morning.     . naproxen (NAPROSYN) 500 MG tablet Take 1 tablet (500 mg total) by mouth 2 (two) times daily. 30 tablet 0  . nebivolol (BYSTOLIC) 10 MG tablet Take 1 tablet (10 mg total) by mouth daily. 30 tablet 1  . nicotine (NICOTROL) 10 MG inhaler Inhale 1 continuous puffing into the lungs daily as needed for smoking cessation.     . nortriptyline (PAMELOR) 25 MG capsule Take 2 capsules (50 mg total) by mouth at bedtime. 2  qhs 60 capsule 8  . ondansetron (ZOFRAN) 4 MG tablet Take 1 tablet (4 mg total) by mouth every 8 (eight) hours as needed for nausea or vomiting. 20 tablet 0  . Oxycodone HCl 10 MG TABS Take 0.5-1 tablets (5-10 mg total) by mouth every 4 (four) hours as needed. (Patient not taking: Reported on 12/01/2015) 50 tablet 0  . oxyCODONE-acetaminophen (PERCOCET) 7.5-325 MG tablet TAKE 1 TABLET BY MOUTH 4 TIMES A DAY AS NEEDED for PAIN  0  . pantoprazole (PROTONIX) 40 MG tablet Take 40 mg by mouth daily before breakfast.   12  . potassium chloride SA (K-DUR,KLOR-CON) 20 MEQ tablet Take 20 mEq by mouth daily.     . QUEtiapine (SEROQUEL) 25 MG tablet 2  bid 120 tablet 3  . sucralfate (CARAFATE) 1 G tablet Take 1 tablet (1 g total) by mouth 3 (three) times daily with meals. 90 tablet 0  . triamterene-hydrochlorothiazide (DYAZIDE) 50-25 MG capsule Take 1 capsule by mouth every morning.    . valACYclovir (VALTREX) 1000 MG tablet Take 1,000 mg by mouth daily.     . vitamin B-12 (CYANOCOBALAMIN) 1000 MCG tablet Take 1,000 mcg by mouth daily.    Marland Kitchen zolpidem (AMBIEN CR) 12.5 MG CR tablet Take 1 tablet (12.5 mg total) by mouth at bedtime as needed for sleep. 30 tablet 5   No current facility-administered medications for this visit.     Lab Results: No results found for this or any previous visit (from the past 48 hour(s)).  Physical Findings: AIMS:  , ,  ,  ,    CIWA:    COWS:     Musculoskeletal: Strength & Muscle  Tone: within normal  limits Gait & Station: normal Patient leans: N/A  Psychiatric Specialty Exam: ROS  Blood pressure 128/70, pulse 72, height 5\' 7"  (1.702 m), weight 219 lb 9.6 oz (99.6 kg).Body mass index is 34.39 kg/m.  General Appearance: Casual  Eye Contact::  Good  Speech:  Clear and Coherent  Volume:  Normal  Mood:  Euthymic  Affect:  Congruent  Thought Process:  Coherent  Orientation:  Full (Time, Place, and Person)  Thought Content:  WDL  Suicidal Thoughts:  No  Homicidal Thoughts:  No  Memory:  NA  Judgement:  Good  Insight:  Fair  Psychomotor Activity:  Normal  Concentration:  Fair  Recall:  Good  Fund of Knowledge:Good  Language: Good  Akathisia:  No  Handed:  Right  AIMS (if indicated):     Assets:   ADL's:  Intact  Cognition: WNL  Sleep:       Treatment Plan  01/10/2016, 10:18 AM At this time the patient is doing the best she can. She's taking a pros of staying at home and keeping away from people. I think this is reasonable. Patient though feels very irritable and reactive. At this time we'll go ahead and increase her Seroquel to a dose of 50 mg twice a day. The patient will continue taking nortriptyline 50 mg and Fetzima 40 mg. Patient will continue taking Valium 10 mg twice a day. She also takes  Ambien when necessary. overall the patient is surviving. She manages daily, herself seems to have good insight about setting limits. She's having a hard time dealing with her mentally ill son is no depression. Patient is not suicidal nor she homicidal. Physically she is actually doing better. She'll return to see me in 3 months for a 30 minute visit.

## 2016-01-24 ENCOUNTER — Ambulatory Visit (HOSPITAL_COMMUNITY): Payer: Self-pay | Admitting: Psychiatry

## 2016-03-09 DIAGNOSIS — R682 Dry mouth, unspecified: Secondary | ICD-10-CM

## 2016-03-09 DIAGNOSIS — K117 Disturbances of salivary secretion: Secondary | ICD-10-CM | POA: Insufficient documentation

## 2016-03-23 ENCOUNTER — Telehealth (HOSPITAL_COMMUNITY): Payer: Self-pay
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
## 2016-04-12 ENCOUNTER — Emergency Department (HOSPITAL_COMMUNITY)
Admission: EM | Admit: 2016-04-12 | Discharge: 2016-04-12 | Disposition: A | Discharge status: Home / Self Care | Payer: Medicaid Other | Attending: Emergency Medicine | Admitting: Emergency Medicine

## 2016-04-12 ENCOUNTER — Encounter (HOSPITAL_COMMUNITY): Payer: Self-pay | Admitting: Emergency Medicine

## 2016-04-12 ENCOUNTER — Emergency Department (HOSPITAL_BASED_OUTPATIENT_CLINIC_OR_DEPARTMENT_OTHER)
Admit: 2016-04-12 | Discharge: 2016-04-12 | Disposition: A | Discharge status: Home / Self Care | Payer: Medicaid Other | Attending: Emergency Medicine | Admitting: Emergency Medicine

## 2016-04-12 DIAGNOSIS — Z91040 Latex allergy status: Secondary | ICD-10-CM | POA: Insufficient documentation

## 2016-04-12 DIAGNOSIS — F17210 Nicotine dependence, cigarettes, uncomplicated: Secondary | ICD-10-CM | POA: Insufficient documentation

## 2016-04-12 DIAGNOSIS — I10 Essential (primary) hypertension: Secondary | ICD-10-CM | POA: Diagnosis not present

## 2016-04-12 DIAGNOSIS — M79662 Pain in left lower leg: Secondary | ICD-10-CM | POA: Diagnosis present

## 2016-04-12 DIAGNOSIS — R252 Cramp and spasm: Secondary | ICD-10-CM | POA: Insufficient documentation

## 2016-04-12 DIAGNOSIS — Z79899 Other long term (current) drug therapy: Secondary | ICD-10-CM | POA: Diagnosis not present

## 2016-04-12 DIAGNOSIS — M79609 Pain in unspecified limb: Secondary | ICD-10-CM | POA: Diagnosis not present

## 2016-04-12 DIAGNOSIS — J449 Chronic obstructive pulmonary disease, unspecified: Secondary | ICD-10-CM | POA: Insufficient documentation

## 2016-04-12 LAB — COMPREHENSIVE METABOLIC PANEL
ALT: 10 U/L — ABNORMAL LOW (ref 14–54)
AST: 14 U/L — ABNORMAL LOW (ref 15–41)
Albumin: 3.8 g/dL (ref 3.5–5.0)
Alkaline Phosphatase: 91 U/L (ref 38–126)
Anion gap: 10 (ref 5–15)
BUN: 13 mg/dL (ref 6–20)
CHLORIDE: 106 mmol/L (ref 101–111)
CO2: 23 mmol/L (ref 22–32)
CREATININE: 0.91 mg/dL (ref 0.44–1.00)
Calcium: 9.4 mg/dL (ref 8.9–10.3)
Glucose, Bld: 116 mg/dL — ABNORMAL HIGH (ref 65–99)
Potassium: 3.3 mmol/L — ABNORMAL LOW (ref 3.5–5.1)
Sodium: 139 mmol/L (ref 135–145)
Total Bilirubin: 0.7 mg/dL (ref 0.3–1.2)
Total Protein: 7.2 g/dL (ref 6.5–8.1)

## 2016-04-12 LAB — CBC WITH DIFFERENTIAL/PLATELET
BASOS ABS: 0 10*3/uL (ref 0.0–0.1)
Basophils Relative: 0 %
EOS ABS: 0.2 10*3/uL (ref 0.0–0.7)
Eosinophils Relative: 1 %
HCT: 41.3 % (ref 36.0–46.0)
HEMOGLOBIN: 14 g/dL (ref 12.0–15.0)
LYMPHS ABS: 3.5 10*3/uL (ref 0.7–4.0)
LYMPHS PCT: 27 %
MCH: 30.1 pg (ref 26.0–34.0)
MCHC: 33.9 g/dL (ref 30.0–36.0)
MCV: 88.8 fL (ref 78.0–100.0)
Monocytes Absolute: 0.7 10*3/uL (ref 0.1–1.0)
Monocytes Relative: 5 %
NEUTROS PCT: 67 %
Neutro Abs: 8.6 10*3/uL — ABNORMAL HIGH (ref 1.7–7.7)
Platelets: 340 10*3/uL (ref 150–400)
RBC: 4.65 MIL/uL (ref 3.87–5.11)
RDW: 14.8 % (ref 11.5–15.5)
WBC: 12.9 10*3/uL — AB (ref 4.0–10.5)

## 2016-04-12 LAB — RAPID URINE DRUG SCREEN, HOSP PERFORMED
Amphetamines: NOT DETECTED
Barbiturates: NOT DETECTED
Benzodiazepines: POSITIVE — AB
COCAINE: NOT DETECTED
OPIATES: NOT DETECTED
TETRAHYDROCANNABINOL: NOT DETECTED

## 2016-04-12 LAB — I-STAT TROPONIN, ED: TROPONIN I, POC: 0 ng/mL (ref 0.00–0.08)

## 2016-04-12 LAB — LIPASE, BLOOD: LIPASE: 19 U/L (ref 11–51)

## 2016-04-12 MED ORDER — TRIAMTERENE 50 MG PO CAPS
50.0000 mg | ORAL_CAPSULE | Freq: Once | ORAL | Status: AC
  Administered 2016-04-12: 50 mg via ORAL
  Filled 2016-04-12: qty 1

## 2016-04-12 MED ORDER — NEBIVOLOL HCL 10 MG PO TABS
10.0000 mg | ORAL_TABLET | Freq: Once | ORAL | Status: AC
  Administered 2016-04-12: 10 mg via ORAL
  Filled 2016-04-12: qty 1

## 2016-04-12 MED ORDER — AMLODIPINE BESYLATE 5 MG PO TABS
5.0000 mg | ORAL_TABLET | Freq: Once | ORAL | Status: AC
  Administered 2016-04-12: 5 mg via ORAL
  Filled 2016-04-12: qty 1

## 2016-04-12 MED ORDER — POTASSIUM CHLORIDE CRYS ER 20 MEQ PO TBCR
40.0000 meq | EXTENDED_RELEASE_TABLET | Freq: Once | ORAL | Status: AC
  Administered 2016-04-12: 40 meq via ORAL
  Filled 2016-04-12: qty 2

## 2016-04-12 MED ORDER — HYDROCHLOROTHIAZIDE 25 MG PO TABS
25.0000 mg | ORAL_TABLET | Freq: Once | ORAL | Status: AC
  Administered 2016-04-12: 25 mg via ORAL
  Filled 2016-04-12: qty 1

## 2016-04-12 NOTE — ED Notes (Addendum)
Returned from vascular

## 2016-04-12 NOTE — Progress Notes (Signed)
VASCULAR LAB PRELIMINARY  PRELIMINARY  PRELIMINARY  PRELIMINARY  Left lower extremity venous duplex completed.    Preliminary report:  There is no DVT or SVT noted in the left lower extremity.   Called report to Dr. Marius Ditch, [place], RVT 04/12/2016, 9:19 AM

## 2016-04-12 NOTE — ED Notes (Signed)
Remains in US.

## 2016-04-12 NOTE — ED Notes (Signed)
Patient transported to Ultrasound vascular  

## 2016-04-12 NOTE — ED Triage Notes (Signed)
Pt. Stated, I started having left leg cramping yesterday and I had acid reflux with a lot of sweating all day.  My leg is cramping now. It also makes me sweat.  Yesterday I had cold feet all day yesterday.

## 2016-04-12 NOTE — ED Provider Notes (Signed)
[facility] DEPT Provider Note   CSN: [id number] Arrival date & time: 04/12/16 [id number]     History    Chief Complaint  Patient presents with  . Leg Pain  . Gastroesophageal Reflux     HPI Gloria Lewis is a 61 y.o. female.  61yo F w/ PMH below including bipolar d/o, HTN, chronic R shoulder pain who p/w leg cramping and cold feet. Pt states that since yesterday She has had intermittent cramping of her left lower leg. She denies any cramping currently. He also reports that all day yesterday her feet felt cold despite wrapping them in blankets which she states is unusual for her. She reports generalized sweating for the last several weeks. No associated chest pain or shortness of breath. She does state that she had vomiting and diarrhea all day yesterday that has been resolved today. She denies any sick contacts, fevers, or urinary symptoms. She denies any complaints of pain currently. She has not taken any of her medications yet today.   Past Medical History:  Diagnosis Date  . Anginal pain ([place])    admit 06/2014; had non-ischemic stress test  . Anxiety   . Bipolar 1 disorder ([place])   . Colon polyp   . CTS (carpal tunnel syndrome)   . Depression   . Fever blister   . GERD (gastroesophageal reflux disease)   . HA (headache)   . HTN (hypertension)   . Hypercholesterolemia   . Migraines   . OA (osteoarthritis)   . Schizo-affective psychosis [facility])      Patient Active Problem List   Diagnosis Date Noted  . Chronic rhinitis 07/22/2015  . Surgery, elective 05/23/2015  . S/P arthroscopy of shoulder 05/23/2015  . Chronic migraine without aura without status migrainosus, not intractable 10/18/2014  . Tobacco abuse 10/18/2014  . Obesity 09/23/2014  . COPD 09/02/2014  . Pulmonary hypertension 08/31/2014  . Respiratory failure with hypoxia ([place]) 08/14/2014  . Cigarette smoker 07/28/2014  . Major depressive disorder, recurrent episode, moderate ([place]) 07/06/2014  . Essential  hypertension   . SOB (shortness of breath) 06/21/2014  . Precordial pain 06/21/2014  . GERD (gastroesophageal reflux disease) 06/21/2014  . Chest pain 06/21/2014  . HTN (hypertension)   . Neck pain 01/08/2014  . Major depressive disorder, recurrent episode, severe, without mention of psychotic behavior 10/14/2012  . Schizoaffective disorder ([place]) 05/26/2012  . Parathyroid adenoma 10/06/2010  . Hyperparathyroidism, primary ([place]) 10/06/2010  . DEGENERATIVE DISC DISEASE, LUMBOSACRAL SPINE 05/21/2007  . DERMATOPHYTOSIS OF THE BODY 05/10/2007  . 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 Anxiety state 03/24/2007  . DENTAL PAIN 03/24/2007  . SHOULDER PAIN, LEFT 03/24/2007    Past Surgical History:  Procedure Laterality Date  . ANTERIOR CERVICAL DECOMP/DISCECTOMY FUSION  08/27/2011   Procedure: ANTERIOR CERVICAL DECOMPRESSION/DISCECTOMY FUSION 1 LEVEL/HARDWARE REMOVAL;  Surgeon: Eustace Moore, MD;  Location: [facility];  Service: Neurosurgery;  Laterality: Bilateral;  Cervical four-five Anterior cervical decompression/diskectomy, fusion, Plate, Removal of Cervical five-seven Plate  . back injection    . CARDIAC CATHETERIZATION N/A 11/09/2014   Procedure: Right Heart Cath;  Surgeon: Larey Dresser, MD;  Location: [facility];  Service: Cardiovascular;  Laterality: N/A;  . CARPAL TUNNEL RELEASE  20110 rt/lt  . HEMORRHOID SURGERY    . MULTIPLE TOOTH EXTRACTIONS    . NECK SURGERY  2009  . PITUITARY SURGERY     Had gland removed from producing too much calcium  . polp removed  2011  .  SHOULDER ARTHROSCOPY WITH ROTATOR CUFF REPAIR Right 05/23/2015   Procedure: RIGHT SHOULDER ARTHROSCOPY WITH REMOVAL OF SUTURE ANCHOR AND POSSIBLE REVISION ROTATOR CUFF REPAIR;  Surgeon: Justice Britain, MD;  Location: [facility];  Service: Orthopedics;  Laterality: Right;  . SHOULDER ARTHROSCOPY WITH SUBACROMIAL DECOMPRESSION Right 01/24/2015   Procedure: RIGHT SHOULDER ARTHROSCOPY WITH SUBACROMIAL  DECOMPRESSION AD DISTAL CLAVICLE RESECTION ;  Surgeon: Justice Britain, MD;  Location: [facility];  Service: Orthopedics;  Laterality: Right;  Marland Kitchen VAGINAL DELIVERY     x3    OB History    No data available        Home Medications    Prior to Admission medications   Medication Sig Start Date End Date Taking? Authorizing Provider  amLODipine (NORVASC) 5 MG tablet Take 5 mg by mouth daily.   Yes Historical Provider, MD  atorvastatin (LIPITOR) 10 MG tablet Take 10 mg by mouth daily.   Yes Historical Provider, MD  Azelastine HCl 0.15 % SOLN Place 1 spray into both nostrils 2 (two) times daily. 07/22/15  Yes Adelina Mings, MD  BELBUCA 75 MCG FILM Take 1 Film by mouth See admin instructions. Place 1 film under the tongue one daily for 1 week, then 1 film under the tongue every 12 hours thereafter 04/03/16  Yes Historical Provider, MD  cholecalciferol (VITAMIN D) 1000 UNITS tablet Take 1,000 Units by mouth daily.   Yes Historical Provider, MD  diazepam (VALIUM) 5 MG tablet Take 2 tablets (10 mg total) by mouth 2 (two) times daily. 01/10/16  Yes Norma Fredrickson, MD  folic acid (FOLVITE) 1 MG tablet Take 1 mg by mouth daily.   Yes Historical Provider, MD  Levomilnacipran HCl ER 40 MG CP24 Take 40 mg by mouth daily. 01/10/16  Yes Norma Fredrickson, MD  lidocaine (XYLOCAINE) 2 % solution Use as directed 15 mLs in the mouth or throat every 6 (six) hours as needed for mouth pain. 01/06/16  Yes Carlisle Cater, PA-C  metoCLOPramide (REGLAN) 10 MG tablet Take 1 tablet (10 mg total) by mouth every 6 (six) hours as needed for nausea (nausea/headache). 03/21/15  Yes Mercedes Camprubi-Soms, PA-C  Multiple Vitamins-Minerals (MULTIVITAMIN WITH MINERALS) tablet Take 1 tablet by mouth every morning.    Yes Historical Provider, MD  nebivolol (BYSTOLIC) 10 MG tablet Take 1 tablet (10 mg total) by mouth daily. 08/14/14  Yes Tammy S Parrett, NP  nicotine (NICOTROL) 10 MG inhaler Inhale 1 continuous puffing into the lungs daily as  needed for smoking cessation.    Yes Historical Provider, MD  nortriptyline (PAMELOR) 25 MG capsule Take 2 capsules (50 mg total) by mouth at bedtime. 2  qhs 01/10/16  Yes Norma Fredrickson, MD  ondansetron (ZOFRAN) 4 MG tablet Take 1 tablet (4 mg total) by mouth every 8 (eight) hours as needed for nausea or vomiting. 05/23/15  Yes Tracy Shuford, PA-C  pantoprazole (PROTONIX) 40 MG tablet Take 40 mg by mouth daily before breakfast.  06/25/14  Yes Historical Provider, MD  potassium chloride SA (K-DUR,KLOR-CON) 20 MEQ tablet Take 20 mEq by mouth 2 (two) times daily.    Yes Historical Provider, MD  QUEtiapine (SEROQUEL) 25 MG tablet 2  bid Patient taking differently: Take 50 mg by mouth 2 (two) times daily.  01/10/16  Yes Norma Fredrickson, MD  triamterene-hydrochlorothiazide (DYAZIDE) 50-25 MG capsule Take 1 capsule by mouth every morning.   Yes Historical Provider, MD  valACYclovir (VALTREX) 1000 MG tablet Take 1,000 mg by mouth daily.    Yes Historical  Provider, MD  vitamin B-12 (CYANOCOBALAMIN) 1000 MCG tablet Take 1,000 mcg by mouth daily.   Yes Historical Provider, MD  zolpidem (AMBIEN CR) 12.5 MG CR tablet Take 1 tablet (12.5 mg total) by mouth at bedtime as needed for sleep. 01/10/16  Yes Norma Fredrickson, MD  acetaminophen (TYLENOL) 325 MG tablet Take 2 tablets (650 mg total) by mouth every 6 (six) hours as needed. Patient not taking: Reported on 12/01/2015 08/10/15   Waynetta Pean, PA-C  aspirin EC 81 MG EC tablet Take 1 tablet (81 mg total) by mouth daily. Patient not taking: Reported on 04/12/2016 06/23/14   Orson Eva, MD  loratadine (CLARITIN) 10 MG tablet Take 1 tablet (10 mg total) by mouth daily. Patient not taking: Reported on 12/01/2015 03/21/15   Mercedes Camprubi-Soms, PA-C  methocarbamol (ROBAXIN) 500 MG tablet Take 1 tablet (500 mg total) by mouth 2 (two) times daily. Patient not taking: Reported on 04/12/2016 01/03/16   Roxanna Mew, PA-C  naproxen (NAPROSYN) 500 MG tablet Take 1 tablet (500  mg total) by mouth 2 (two) times daily. Patient not taking: Reported on 04/12/2016 01/06/16   Carlisle Cater, PA-C  Oxycodone HCl 10 MG TABS Take 0.5-1 tablets (5-10 mg total) by mouth every 4 (four) hours as needed. Patient not taking: Reported on 12/01/2015 05/23/15   Jenetta Loges, PA-C  sucralfate (CARAFATE) 1 G tablet Take 1 tablet (1 g total) by mouth 3 (three) times daily with meals. Patient not taking: Reported on 04/12/2016 06/24/14   Orson Eva, MD      Family History  Problem Relation Age of Onset  . Coronary artery disease Father   . Cancer Father     head neck   . Hypertension Mother   . Schizophrenia Mother   . Depression Brother   . Prostate cancer Brother   . Anesthesia problems Neg Hx   . Hypotension Neg Hx   . Malignant hyperthermia Neg Hx   . Pseudochol deficiency Neg Hx   . Allergic rhinitis Neg Hx   . Angioedema Neg Hx   . Asthma Neg Hx   . Atopy Neg Hx   . Eczema Neg Hx   . Immunodeficiency Neg Hx   . Urticaria Neg Hx      Social History  Substance Use Topics  . Smoking status: Current Some Day Smoker    Packs/day: 0.10    Years: 30.00    Types: Cigarettes  . Smokeless tobacco: Current User     Comment: using nicotrol inhaler  . Alcohol use No     Allergies     Aspirin; Effexor [venlafaxine hydrochloride]; Latex; Penicillins; Zithromax [azithromycin dihydrate]; Atorvastatin; Butrans [buprenorphine]; Codeine; Nucynta [tapentadol]; Chantix [varenicline tartrate]; Paroxetine hcl; and Tramadol    Review of Systems  10 Systems reviewed and are negative for acute change except as noted in the HPI.   Physical Exam Updated Vital Signs BP (!) 131/103 (BP Location: Right Arm)   Pulse 89   Temp 98.6 F (37 C) (Oral)   Resp 23   Ht 5\' 7"  (1.702 m)   Wt 227 lb (103 kg)   SpO2 100%   BMI 35.55 kg/m   Physical Exam  Constitutional: She is oriented to person, place, and time. She appears well-developed and well-nourished. No distress.  HENT:  Head:  Normocephalic and atraumatic.  Moist mucous membranes  Eyes: Conjunctivae are normal. Pupils are equal, round, and reactive to light.  Neck: Neck supple.  Cardiovascular: Regular rhythm, normal heart sounds and  intact distal pulses.  Tachycardia present.   No murmur heard. Pulmonary/Chest: Effort normal and breath sounds normal.  Abdominal: Soft. Bowel sounds are normal. She exhibits no distension. There is no tenderness.  Musculoskeletal: She exhibits no edema.  Tenderness of L calf with no obvious swelling  Neurological: She is alert and oriented to person, place, and time. No sensory deficit.  Fluent speech  Skin: Skin is warm and dry. Capillary refill takes less than 2 seconds. No rash noted. No erythema.  Psychiatric: She has a normal mood and affect. Judgment normal.  Nursing note and vitals reviewed.     ED Treatments / Results  Labs (all labs ordered are listed, but only abnormal results are displayed) Labs Reviewed  COMPREHENSIVE METABOLIC PANEL - Abnormal; Notable for the following:       Result Value   Potassium 3.3 (*)    Glucose, Bld 116 (*)    AST 14 (*)    ALT 10 (*)    All other components within normal limits  CBC WITH DIFFERENTIAL/PLATELET - Abnormal; Notable for the following:    WBC 12.9 (*)    Neutro Abs 8.6 (*)    All other components within normal limits  RAPID URINE DRUG SCREEN, HOSP PERFORMED - Abnormal; Notable for the following:    Benzodiazepines POSITIVE (*)    All other components within normal limits  LIPASE, BLOOD  I-STAT TROPOININ, ED     EKG  EKG Interpretation  Date/Time:  Sunday April 12 2016 07:12:24 EST Ventricular Rate:  142 PR Interval:    QRS Duration: 90 QT Interval:  356 QTC Calculation: 547 R Axis:   80 Text Interpretation:  Sinus tachycardia with occasional Premature ventricular complexes Otherwise normal ECG tachycardia new from previous Confirmed by Honesty Menta MD, Marques Ericson [id number]) on 04/12/2016 7:34:59 AM Also confirmed by  Ava Tangney MD, Genelle Economou [phone]), editor [place], Leda Gauze [phone])  on 04/12/2016 8:57:58 AM         Radiology No results found.  Procedures Procedures (including critical care time) Procedures  Medications Ordered in ED  Medications  amLODipine (NORVASC) tablet 5 mg (5 mg Oral Given 04/12/16 0835)  nebivolol (BYSTOLIC) tablet 10 mg (10 mg Oral Given 04/12/16 1008)  triamterene (DYRENIUM) capsule 50 mg (50 mg Oral Given 04/12/16 1007)  hydrochlorothiazide (HYDRODIURIL) tablet 25 mg (25 mg Oral Given 04/12/16 0835)  potassium chloride SA (K-DUR,KLOR-CON) CR tablet 40 mEq (40 mEq Oral Given 04/12/16 1008)     Initial Impression / Assessment and Plan / ED Course  I have reviewed the triage vital signs and the nursing notes.  Pertinent labs & imaging results that were available during my care of the patient were reviewed by me and considered in my medical decision making (see chart for details).  Clinical Course    PT w/ intermittent L leg cramping since yesterday and V/D yesterday that have since resolved. She was comfortable on exam, vital signs notable for hypertension at 149/102. Heart rate at triage was 136, during my examination was near 120. She was neurovascularly intact distally with warm feet. No cyanosis or skin changes.  Her heart rate may in part be related to the fact that she had not taken her medications today. Gave her her home doses and later her heart rate normalized. Regarding her report of cold feet, she has good distal pulses and I do not appreciate any circulation problems on exam.  EKG with sinus tachycardia, no ischemic changes. Troponin normal. Her sweating has been ongoing for  weeks and is not associated with chest pain or shortness of breath therefore I doubt ACS or other cardiac etiology of her sx.  Her lab work here is reassuring including normal troponin. Ultrasound of leg negative for DVT. Potassium borderline low at 3.3, gave oral potassium repletion. I explained that  her vomiting and diarrhea may have caused some mild dehydration and hypokalemia which could explain her cramping. She is well-appearing on reexamination with reassuring vital signs. I discussed supportive care and reviewed return precautions. Patient voiced understanding and was discharged in satisfactory condition. Final Clinical Impressions(s) / ED Diagnoses   Final diagnoses:  Leg cramping     Discharge Medication List as of 04/12/2016 11:24 AM         Sharlett Iles, MD 04/12/16 1515

## 2016-04-14 ENCOUNTER — Encounter (HOSPITAL_COMMUNITY): Payer: Self-pay

## 2016-04-15 ENCOUNTER — Telehealth (HOSPITAL_COMMUNITY): Payer: Self-pay | Admitting: Psychiatry

## 2016-04-16 ENCOUNTER — Ambulatory Visit (INDEPENDENT_AMBULATORY_CARE_PROVIDER_SITE_OTHER): Payer: Medicaid Other | Admitting: Gastroenterology

## 2016-04-16 ENCOUNTER — Encounter: Payer: Self-pay | Admitting: Gastroenterology

## 2016-04-16 VITALS — BP 94/76 | HR 72 | Ht 66.0 in | Wt 227.5 lb

## 2016-04-16 DIAGNOSIS — K219 Gastro-esophageal reflux disease without esophagitis: Secondary | ICD-10-CM

## 2016-04-16 DIAGNOSIS — A09 Infectious gastroenteritis and colitis, unspecified: Secondary | ICD-10-CM

## 2016-04-16 MED ORDER — DEXLANSOPRAZOLE 60 MG PO CPDR: 60.0000 mg | DELAYED_RELEASE_CAPSULE | Freq: Every day | ORAL | 3 refills | Status: DC

## 2016-04-16 NOTE — Progress Notes (Addendum)
04/16/2016 Gloria Lewis [id number] 06/19/1955   HISTORY OF PRESENT ILLNESS:  This is a 61 year old female who is new to our practice. Referred here by Dr. Harlan Stains.  She has past medical history of anxiety, bipolar, depression, GERD, hypertension, hyper cholesterolemia, migraines, osteoarthritis, schizoaffective psychosis.  She has GI history with Dr. Amedeo Plenty at [facility] and tells me that she had a colonoscopy and EGD 1-2 years ago. She tells me that these studies were normal from what she can remember except for acid reflux. She presents to our office today for two different complaints. First, she says that over the weekend she had sudden onset of nausea, vomiting, and diarrhea.  She says that now the symptoms have significantly improved/resolved. No nausea or vomiting the past 2 days. Has been drinking lots of liquids but is afraid to eat. Had 1 episode of diarrhea in the past 12 hours. Denies any abdominal pain or blood in stools.  She also complains of acid reflux despite taking Protonix 40 mg twice a day.  Thinks that she may have been taking it too long and may need to try something different.  Has not ever been on any other prescriptions for this.  The only other thing that she has tried in the past was Gaviscon.  She is not a very good historian, jumping from one subject to another and from present issues to past issues.  Spent a lot of time complaining of a "crazy neighbor" and about her pain management clinic and about how they accuse her of selling her medication if she does not "pee it out".  Difficult to keep on current issues.   Past Medical History:  Diagnosis Date  . Anginal pain ([place])    admit 06/2014; had non-ischemic stress test  . Anxiety   . Bipolar 1 disorder ([place])   . Colon polyp   . CTS (carpal tunnel syndrome)   . Depression   . Fever blister   . GERD (gastroesophageal reflux disease)   . HA (headache)   . HTN (hypertension)   . Hypercholesterolemia   .  Migraines   . OA (osteoarthritis)   . Schizo-affective psychosis ([place])    Past Surgical History:  Procedure Laterality Date  . ANTERIOR CERVICAL DECOMP/DISCECTOMY FUSION  08/27/2011   Procedure: ANTERIOR CERVICAL DECOMPRESSION/DISCECTOMY FUSION 1 LEVEL/HARDWARE REMOVAL;  Surgeon: Eustace Moore, MD;  Location: [facility];  Service: Neurosurgery;  Laterality: Bilateral;  Cervical four-five Anterior cervical decompression/diskectomy, fusion, Plate, Removal of Cervical five-seven Plate  . back injection    . CARDIAC CATHETERIZATION N/A 11/09/2014   Procedure: Right Heart Cath;  Surgeon: Larey Dresser, MD;  Location: [facility];  Service: Cardiovascular;  Laterality: N/A;  . CARPAL TUNNEL RELEASE  20110 rt/lt   rt x2 , lt x1  . HEMORRHOID SURGERY    . MULTIPLE TOOTH EXTRACTIONS    . NECK SURGERY  2009  . PITUITARY SURGERY     Had gland removed from producing too much calcium  . polp removed  2011  . SHOULDER ARTHROSCOPY WITH ROTATOR CUFF REPAIR Right 05/23/2015   Procedure: RIGHT SHOULDER ARTHROSCOPY WITH REMOVAL OF SUTURE ANCHOR AND POSSIBLE REVISION ROTATOR CUFF REPAIR;  Surgeon: Justice Britain, MD;  Location: [facility];  Service: Orthopedics;  Laterality: Right;  . SHOULDER ARTHROSCOPY WITH SUBACROMIAL DECOMPRESSION Right 01/24/2015   Procedure: RIGHT SHOULDER ARTHROSCOPY WITH SUBACROMIAL DECOMPRESSION AD DISTAL CLAVICLE RESECTION ;  Surgeon: Justice Britain, MD;  Location: [facility];  Service: Orthopedics;  Laterality: Right;  Marland Kitchen VAGINAL DELIVERY     x3    reports that she has been smoking Cigarettes.  She has a 3.00 pack-year smoking history. She has never used smokeless tobacco. She reports that she does not drink alcohol or use drugs. family history includes Cancer in her father; Coronary artery disease in her father; Depression in her brother; Hypertension in her mother; Prostate cancer in her brother; Schizophrenia in her mother. Allergies  Allergen Reactions  . Aspirin Hives and Itching    . Effexor [Venlafaxine Hydrochloride] Itching and Other (See Comments)    headache  . Latex Itching and Rash  . Penicillins Hives    Has patient had a PCN reaction causing immediate rash, facial/tongue/throat swelling, SOB or lightheadedness with hypotension: Yes Has patient had a PCN reaction causing severe rash involving mucus membranes or skin necrosis: No Has patient had a PCN reaction that required hospitalization No Has patient had a PCN reaction occurring within the last 10 years: No If all of the above answers are "NO", then may proceed with Cephalosporin use.   . Zithromax [Azithromycin Dihydrate] Swelling  . Atorvastatin Swelling  . Butrans [Buprenorphine] Other (See Comments)    Ulcers-"mouth would not heal"  . Codeine Itching    Tolerable with benadryl  . Nucynta [Tapentadol] Other (See Comments)    Ulcers inside of mouth  . Chantix [Varenicline Tartrate] Nausea Only  . Paroxetine Hcl Other (See Comments)    headache  . Tramadol Other (See Comments)    Pt states it interacted with her sertraline, but she is no longer on sertraline.  She does not remember the type of reaction she had.       Outpatient Encounter Prescriptions as of 04/16/2016  Medication Sig  . acetaminophen (TYLENOL) 325 MG tablet Take 2 tablets (650 mg total) by mouth every 6 (six) hours as needed.  Marland Kitchen amLODipine (NORVASC) 5 MG tablet Take 5 mg by mouth daily.  Marland Kitchen aspirin EC 81 MG EC tablet Take 1 tablet (81 mg total) by mouth daily.  Marland Kitchen atorvastatin (LIPITOR) 10 MG tablet Take 10 mg by mouth daily.  . Azelastine HCl 0.15 % SOLN Place 1 spray into both nostrils 2 (two) times daily.  Marland Kitchen BELBUCA 75 MCG FILM Take 1 Film by mouth See admin instructions. Place 1 film under the tongue one daily for 1 week, then 1 film under the tongue every 12 hours thereafter  . cholecalciferol (VITAMIN D) 1000 UNITS tablet Take 1,000 Units by mouth daily.  . diazepam (VALIUM) 5 MG tablet Take 2 tablets (10 mg total) by mouth 2  (two) times daily.  . folic acid (FOLVITE) 1 MG tablet Take 1 mg by mouth daily.  . Levomilnacipran HCl ER 40 MG CP24 Take 40 mg by mouth daily.  Marland Kitchen lidocaine (XYLOCAINE) 2 % solution Use as directed 15 mLs in the mouth or throat every 6 (six) hours as needed for mouth pain.  Marland Kitchen loratadine (CLARITIN) 10 MG tablet Take 1 tablet (10 mg total) by mouth daily.  . methocarbamol (ROBAXIN) 500 MG tablet Take 1 tablet (500 mg total) by mouth 2 (two) times daily.  . metoCLOPramide (REGLAN) 10 MG tablet Take 1 tablet (10 mg total) by mouth every 6 (six) hours as needed for nausea (nausea/headache).  . Multiple Vitamins-Minerals (MULTIVITAMIN WITH MINERALS) tablet Take 1 tablet by mouth every morning.   . naproxen (NAPROSYN) 500 MG tablet Take 1 tablet (500 mg total) by mouth 2 (two)  times daily.  . nebivolol (BYSTOLIC) 10 MG tablet Take 1 tablet (10 mg total) by mouth daily.  . nicotine (NICOTROL) 10 MG inhaler Inhale 1 continuous puffing into the lungs daily as needed for smoking cessation.   . nortriptyline (PAMELOR) 25 MG capsule Take 2 capsules (50 mg total) by mouth at bedtime. 2  qhs  . ondansetron (ZOFRAN) 4 MG tablet Take 1 tablet (4 mg total) by mouth every 8 (eight) hours as needed for nausea or vomiting.  . Oxycodone HCl 10 MG TABS Take 0.5-1 tablets (5-10 mg total) by mouth every 4 (four) hours as needed.  . pantoprazole (PROTONIX) 40 MG tablet Take 40 mg by mouth daily before breakfast.   . potassium chloride SA (K-DUR,KLOR-CON) 20 MEQ tablet Take 20 mEq by mouth 2 (two) times daily.   . QUEtiapine (SEROQUEL) 25 MG tablet 2  bid (Patient taking differently: Take 50 mg by mouth 2 (two) times daily. )  . sucralfate (CARAFATE) 1 G tablet Take 1 tablet (1 g total) by mouth 3 (three) times daily with meals.  . triamterene-hydrochlorothiazide (DYAZIDE) 50-25 MG capsule Take 1 capsule by mouth every morning.  . valACYclovir (VALTREX) 1000 MG tablet Take 1,000 mg by mouth daily.   . vitamin B-12  (CYANOCOBALAMIN) 1000 MCG tablet Take 1,000 mcg by mouth daily.  Marland Kitchen zolpidem (AMBIEN CR) 12.5 MG CR tablet Take 1 tablet (12.5 mg total) by mouth at bedtime as needed for sleep.   No facility-administered encounter medications on file as of 04/16/2016.      REVIEW OF SYSTEMS  : All other systems reviewed and negative except where noted in the History of Present Illness.   PHYSICAL EXAM: BP 94/76 (BP Location: Left Arm, Patient Position: Sitting, Cuff Size: Normal)   Pulse 72   Ht 5\' 6"  (1.676 m) Comment: height measured without shoes  Wt 227 lb 8 oz (103.2 kg)   BMI 36.72 kg/m  General: Well developed black  female in no acute distress Head: Normocephalic and atraumatic Eyes: Sclerae anicteric, conjunctiva pink. Ears: Normal auditory acuity Neck: Supple, no masses.  Lungs: Clear throughout to auscultation; non increased WOB. Heart: Regular rate and rhythm Abdomen: Soft, non-distended.  BS present.  Non-tender. Musculoskeletal: Symmetrical with no gross deformities  Skin: No lesions on visible extremities Extremities: No edema  Neurological: Alert oriented x 4, grossly non-focal Cervical Nodes:  No significant cervical adenopathy Psychological:  Alert and cooperative. Normal mood and affect  ASSESSMENT AND PLAN: -Nausea, vomiting, diarrhea:  Now much improved/resolved.  Likely infectious gastroenteritis.  Slowly increase diet, starting with bland foods/BRAT diet. -GERD:  Still having symptoms despite protonix 40 mg BID.  Had EGD by Dr. Amedeo Plenty 1-2 years ago.  Will obtain those records along with colonoscopy records from his office.  Will discontinue protonix and try Dexilant 60 mg daily instead.  Will call back in 3-4 weeks with an update.  If the Dexilant is working then can send a 90 day supply to [facility].  **Received records from Dr. Amedeo Plenty.  Last colonoscopy was 07/2013 at which time she was found to have only internal hemorrhoids.  Colonoscopy 07/2008 she had 2 polyps  removed that were 4-5 mm in size and were tubular adenomas.  EGD 07/2008  Showed a normal study but esophagus was dilated with 18 mm Savary dilator due to complaints of dysphagia.   CC:  Harlan Stains, MD

## 2016-04-16 NOTE — Patient Instructions (Signed)
We will send Dexilant to your pharmacy  We will obtain your records from Dr Amedeo Plenty  Discontinue Protonix   Criss [facility] Diet Introduction A bland diet consists of foods that do not have a lot of fat or fiber. Foods without fat or fiber are easier for the body to digest. They are also less likely to irritate your mouth, throat, stomach, and other parts of your gastrointestinal tract. A bland diet is sometimes called a BRAT diet. What is my plan? Your health care provider or dietitian may recommend specific changes to your diet to prevent and treat your symptoms, such as:  Eating small meals often.  Cooking food until it is soft enough to chew easily.  Chewing your food well.  Drinking fluids slowly.  Not eating foods that are very spicy, sour, or fatty.  Not eating citrus fruits, such as oranges and grapefruit. What do I need to know about this diet?  Eat a variety of foods from the bland diet food list.  Do not follow a bland diet longer than you have to.  Ask your health care provider whether you should take vitamins. What foods can I eat? Grains  Hot cereals, such as cream of wheat. Bread, crackers, or tortillas made from refined white flour. Rice. Vegetables  Canned or cooked vegetables. Mashed or boiled potatoes. Fruits  Bananas. Applesauce. Other types of cooked or canned fruit with the skin and seeds removed, such as canned peaches or pears. Meats and Other Protein Sources  Scrambled eggs. Creamy peanut butter or other nut butters. Lean, well-cooked meats, such as chicken or fish. Tofu. Soups or broths. Dairy  Low-fat dairy products, such as milk, cottage cheese, or yogurt. Beverages  Water. Herbal tea. Apple juice. Sweets and Desserts  Pudding. Custard. Fruit gelatin. Ice cream. Fats and Oils  Mild salad dressings. Canola or olive oil. The items listed above may not be a complete list of allowed foods or beverages. Contact your dietitian for more options.  What foods  are not recommended? Foods and ingredients that are often not recommended include:  Spicy foods, such as hot sauce or salsa.  Fried foods.  Sour foods, such as pickled or fermented foods.  Raw vegetables or fruits, especially citrus or berries.  Caffeinated drinks.  Alcohol.  Strongly flavored seasonings or condiments. The items listed above may not be a complete list of foods and beverages that are not allowed. Contact your dietitian for more information.  This information is not intended to replace advice given to you by your health care provider. Make sure you discuss any questions you have with your health care provider. Document Released: 07/15/2015 Document Revised: 08/29/2015 Document Reviewed: 04/04/2014  2017 [employer]vier

## 2016-04-17 ENCOUNTER — Encounter (HOSPITAL_COMMUNITY): Payer: Self-pay | Admitting: Psychiatry

## 2016-04-17 ENCOUNTER — Ambulatory Visit (INDEPENDENT_AMBULATORY_CARE_PROVIDER_SITE_OTHER): Payer: Medicaid Other | Admitting: Psychiatry

## 2016-04-17 ENCOUNTER — Telehealth (HOSPITAL_COMMUNITY): Payer: Self-pay | Admitting: Psychiatry

## 2016-04-17 VITALS — BP 128/86 | HR 84 | Ht 66.0 in | Wt 230.0 lb

## 2016-04-17 DIAGNOSIS — F17210 Nicotine dependence, cigarettes, uncomplicated: Secondary | ICD-10-CM

## 2016-04-17 DIAGNOSIS — Z7982 Long term (current) use of aspirin: Secondary | ICD-10-CM | POA: Diagnosis not present

## 2016-04-17 DIAGNOSIS — Z8249 Family history of ischemic heart disease and other diseases of the circulatory system: Secondary | ICD-10-CM | POA: Diagnosis not present

## 2016-04-17 DIAGNOSIS — Z79899 Other long term (current) drug therapy: Secondary | ICD-10-CM | POA: Diagnosis not present

## 2016-04-17 DIAGNOSIS — Z818 Family history of other mental and behavioral disorders: Secondary | ICD-10-CM

## 2016-04-17 DIAGNOSIS — F339 Major depressive disorder, recurrent, unspecified: Secondary | ICD-10-CM

## 2016-04-17 DIAGNOSIS — Z8042 Family history of malignant neoplasm of prostate: Secondary | ICD-10-CM

## 2016-04-17 MED ORDER — QUETIAPINE FUMARATE 25 MG PO TABS: ORAL_TABLET | ORAL | 4 refills | Status: DC

## 2016-04-17 MED ORDER — DIAZEPAM 5 MG PO TABS: 10.0000 mg | ORAL_TABLET | Freq: Two times a day (BID) | ORAL | 4 refills | Status: DC

## 2016-04-17 MED ORDER — ZOLPIDEM TARTRATE ER 12.5 MG PO TBCR: 12.5000 mg | EXTENDED_RELEASE_TABLET | Freq: Every evening | ORAL | 5 refills | Status: DC | PRN

## 2016-04-17 NOTE — Telephone Encounter (Signed)
Patient is asking for a letter for a therapy dog, Please review and advise, thank you
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
## 2016-04-19 NOTE — Progress Notes (Signed)
Agree with Gloria Lewis's management.  Carl E. Gessner, MD, FACG  

## 2016-04-25 ENCOUNTER — Other Ambulatory Visit (HOSPITAL_COMMUNITY): Payer: Self-pay | Admitting: Psychiatry

## 2016-04-29 ENCOUNTER — Other Ambulatory Visit: Payer: Self-pay

## 2016-04-29 ENCOUNTER — Ambulatory Visit (INDEPENDENT_AMBULATORY_CARE_PROVIDER_SITE_OTHER): Payer: Medicaid Other | Admitting: Orthopedic Surgery

## 2016-04-29 ENCOUNTER — Telehealth: Payer: Self-pay | Admitting: Gastroenterology

## 2016-04-29 MED ORDER — AZELASTINE HCL 0.15 % NA SOLN: 1.0000 | Freq: Two times a day (BID) | NASAL | 0 refills | Status: DC

## 2016-04-29 NOTE — Telephone Encounter (Signed)
Rec'd from [employer] forward 33 pages to Alonza Bogus PA

## 2016-05-01 ENCOUNTER — Other Ambulatory Visit: Payer: Self-pay | Admitting: Neurological Surgery

## 2016-05-01 DIAGNOSIS — M542 Cervicalgia: Secondary | ICD-10-CM

## 2016-05-04 ENCOUNTER — Inpatient Hospital Stay: Admission: RE | Admit: 2016-05-04 | Payer: Self-pay | Source: Ambulatory Visit

## 2016-05-06 ENCOUNTER — Ambulatory Visit
Admission: RE | Admit: 2016-05-06 | Discharge: 2016-05-06 | Disposition: A | Discharge status: Home / Self Care | Payer: Medicaid Other | Source: Ambulatory Visit | Attending: Neurological Surgery | Admitting: Neurological Surgery

## 2016-05-06 DIAGNOSIS — M542 Cervicalgia: Secondary | ICD-10-CM

## 2016-05-11 ENCOUNTER — Ambulatory Visit (INDEPENDENT_AMBULATORY_CARE_PROVIDER_SITE_OTHER): Payer: Medicaid Other | Admitting: Orthopedic Surgery

## 2016-05-14 NOTE — Telephone Encounter (Signed)
Patient is asking for a letter for a support animal and a referral to ACT team. Please advise, thank you

## 2016-05-22 ENCOUNTER — Other Ambulatory Visit (HOSPITAL_COMMUNITY): Payer: Self-pay | Admitting: Psychiatry

## 2016-05-26 ENCOUNTER — Other Ambulatory Visit (HOSPITAL_COMMUNITY): Payer: Self-pay | Admitting: Psychiatry

## 2016-06-02 ENCOUNTER — Other Ambulatory Visit (HOSPITAL_COMMUNITY): Payer: Self-pay

## 2016-06-02 MED ORDER — LEVOMILNACIPRAN HCL ER 40 MG PO CP24: 40.0000 mg | ORAL_CAPSULE | Freq: Every day | ORAL | 1 refills | Status: DC

## 2016-06-02 NOTE — Progress Notes (Unsigned)
Error

## 2016-06-08 ENCOUNTER — Telehealth: Payer: Self-pay

## 2016-06-08 NOTE — Telephone Encounter (Signed)
-----   Message from Loralie Champagne, PA-C sent at 06/08/2016  1:36 PM EST ----- Please contact this patient and see if she is taking the [place] that I gave her at her visit in mid-January.  How is she feeling?  I had asked her to call back in about 4 weeks with an update on her symptoms.  Her last EGD was 2010, so 8 years ago.  She had told me that it was 1-2 years ago.  Let me know when you get an update from her.  Thank you,  Jess

## 2016-06-09 NOTE — Telephone Encounter (Signed)
The pt states she is taking Dexilant as recommended and is feeling great.  No GI issues at this time.  She will call back if her symptoms return

## 2016-06-24 ENCOUNTER — Other Ambulatory Visit (HOSPITAL_COMMUNITY): Payer: Self-pay | Admitting: Psychiatry

## 2016-06-25 ENCOUNTER — Encounter (HOSPITAL_COMMUNITY): Payer: Self-pay

## 2016-06-25 ENCOUNTER — Emergency Department (HOSPITAL_COMMUNITY)
Admission: EM | Admit: 2016-06-25 | Discharge: 2016-06-25 | Disposition: A | Discharge status: Home / Self Care | Payer: Medicaid Other | Attending: Emergency Medicine | Admitting: Emergency Medicine

## 2016-06-25 DIAGNOSIS — F17210 Nicotine dependence, cigarettes, uncomplicated: Secondary | ICD-10-CM | POA: Diagnosis not present

## 2016-06-25 DIAGNOSIS — Z77098 Contact with and (suspected) exposure to other hazardous, chiefly nonmedicinal, chemicals: Secondary | ICD-10-CM | POA: Diagnosis not present

## 2016-06-25 DIAGNOSIS — Z7982 Long term (current) use of aspirin: Secondary | ICD-10-CM | POA: Insufficient documentation

## 2016-06-25 DIAGNOSIS — Z79899 Other long term (current) drug therapy: Secondary | ICD-10-CM | POA: Insufficient documentation

## 2016-06-25 DIAGNOSIS — G43009 Migraine without aura, not intractable, without status migrainosus: Secondary | ICD-10-CM | POA: Diagnosis not present

## 2016-06-25 DIAGNOSIS — I10 Essential (primary) hypertension: Secondary | ICD-10-CM | POA: Insufficient documentation

## 2016-06-25 DIAGNOSIS — J449 Chronic obstructive pulmonary disease, unspecified: Secondary | ICD-10-CM | POA: Diagnosis not present

## 2016-06-25 DIAGNOSIS — R51 Headache: Secondary | ICD-10-CM | POA: Diagnosis present

## 2016-06-25 MED ORDER — METOCLOPRAMIDE HCL 5 MG/ML IJ SOLN
10.0000 mg | Freq: Once | INTRAMUSCULAR | Status: AC
  Administered 2016-06-25: 10 mg via INTRAMUSCULAR
  Filled 2016-06-25: qty 2

## 2016-06-25 MED ORDER — DEXAMETHASONE SODIUM PHOSPHATE 10 MG/ML IJ SOLN
10.0000 mg | Freq: Once | INTRAMUSCULAR | Status: AC
  Administered 2016-06-25: 10 mg via INTRAMUSCULAR
  Filled 2016-06-25: qty 1

## 2016-06-25 MED ORDER — DIPHENHYDRAMINE HCL 50 MG/ML IJ SOLN
25.0000 mg | Freq: Once | INTRAMUSCULAR | Status: AC
  Administered 2016-06-25: 25 mg via INTRAMUSCULAR
  Filled 2016-06-25: qty 1

## 2016-06-25 NOTE — ED Triage Notes (Addendum)
Pt is here with c/o headache. She reports she recently discovered she has bed bugs and has been using pesticide spray and states she has been breathing it in, which has caused a headache that started around 0100 this morning. Pt breathing unlabored, speaking in full complete sentences, NAD.

## 2016-06-25 NOTE — ED Provider Notes (Signed)
[facility] DEPT Provider Note   CSN: [id number] Arrival date & time: 06/25/16  1315   By signing my name below, I, [name], attest that this documentation has been prepared under the direction and in the presence of Malvin Johns, MD. Electronically Signed: [place], ED Scribe. 06/25/16. 2:16 PM.  History   Chief Complaint Chief Complaint  Patient presents with  . Breathed in pesticide  . Headache    HPI Gloria Lewis is a 61 y.o. female with a PMHx of HA, HTN, who presents to the Emergency Department complaining of breathing in pesticides onset 1:30 AM this morning. Pt reports associated frontal HA and nausea. Pt has tried albuterol inhaler without medications with mild relief of her symptoms. She states that she was treating her home with store-bought bed bug pesticides, 91% alcohol, hot water, and heat prior to the onset of her symptoms. Pt notes that she went back into her apartment 3 hours after fumigating her apartment when she began to notice her symptoms. Denies vomiting, LOC, and any other symptoms.     The history is provided by the patient. No language interpreter was used.    Past Medical History:  Diagnosis Date  . Anginal pain ([place])    admit 06/2014; had non-ischemic stress test  . Anxiety   . Bipolar 1 disorder ([place])   . Colon polyp   . CTS (carpal tunnel syndrome)   . Depression   . Fever blister   . GERD (gastroesophageal reflux disease)   . HA (headache)   . HTN (hypertension)   . Hypercholesterolemia   . Migraines   . OA (osteoarthritis)   . Schizo-affective psychosis [facility])     Patient Active Problem List   Diagnosis Date Noted  . Infectious gastroenteritis 04/16/2016  . Chronic rhinitis 07/22/2015  . Surgery, elective 05/23/2015  . S/P arthroscopy of shoulder 05/23/2015  . Chronic migraine without aura without status migrainosus, not intractable 10/18/2014  . Tobacco abuse 10/18/2014  . Obesity 09/23/2014  . COPD 09/02/2014  .  Pulmonary hypertension 08/31/2014  . Respiratory failure with hypoxia ([place]) 08/14/2014  . Cigarette smoker 07/28/2014  . Major depressive disorder, recurrent episode, moderate ([place]) 07/06/2014  . Essential hypertension   . SOB (shortness of breath) 06/21/2014  . Precordial pain 06/21/2014  . Gastroesophageal reflux disease 06/21/2014  . Chest pain 06/21/2014  . HTN (hypertension)   . Neck pain 01/08/2014  . Major depressive disorder, recurrent episode, severe, without mention of psychotic behavior 10/14/2012  . Schizoaffective disorder ([place]) 05/26/2012  . Parathyroid adenoma 10/06/2010  . Hyperparathyroidism, primary ([place]) 10/06/2010  . DEGENERATIVE DISC DISEASE, LUMBOSACRAL SPINE 05/21/2007  . DERMATOPHYTOSIS OF THE BODY 05/10/2007  . 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 Anxiety state 03/24/2007  . DENTAL PAIN 03/24/2007  . SHOULDER PAIN, LEFT 03/24/2007    Past Surgical History:  Procedure Laterality Date  . ANTERIOR CERVICAL DECOMP/DISCECTOMY FUSION  08/27/2011   Procedure: ANTERIOR CERVICAL DECOMPRESSION/DISCECTOMY FUSION 1 LEVEL/HARDWARE REMOVAL;  Surgeon: Eustace Moore, MD;  Location: [place] NEURO ORS;  Service: Neurosurgery;  Laterality: Bilateral;  Cervical four-five Anterior cervical decompression/diskectomy, fusion, Plate, Removal of Cervical five-seven Plate  . back injection    . CARDIAC CATHETERIZATION N/A 11/09/2014   Procedure: Right Heart Cath;  Surgeon: Larey Dresser, MD;  Location: [place] [facility] CV LAB;  Service: Cardiovascular;  Laterality: N/A;  . CARPAL TUNNEL RELEASE  20110 rt/lt   rt x2 , lt x1  . HEMORRHOID SURGERY    .  MULTIPLE TOOTH EXTRACTIONS    . NECK SURGERY  2009  . PITUITARY SURGERY     Had gland removed from producing too much calcium  . polp removed  2011  . SHOULDER ARTHROSCOPY WITH ROTATOR CUFF REPAIR Right 05/23/2015   Procedure: RIGHT SHOULDER ARTHROSCOPY WITH REMOVAL OF SUTURE ANCHOR AND POSSIBLE REVISION ROTATOR CUFF REPAIR;  Surgeon:  Justice Britain, MD;  Location: [facility];  Service: Orthopedics;  Laterality: Right;  . SHOULDER ARTHROSCOPY WITH SUBACROMIAL DECOMPRESSION Right 01/24/2015   Procedure: RIGHT SHOULDER ARTHROSCOPY WITH SUBACROMIAL DECOMPRESSION AD DISTAL CLAVICLE RESECTION ;  Surgeon: Justice Britain, MD;  Location: [facility];  Service: Orthopedics;  Laterality: Right;  Marland Kitchen VAGINAL DELIVERY     x3    OB History    No data available       Home Medications    Prior to Admission medications   Medication Sig Start Date End Date Taking? Authorizing Provider  acetaminophen (TYLENOL) 325 MG tablet Take 2 tablets (650 mg total) by mouth every 6 (six) hours as needed. 08/10/15   Waynetta Pean, PA-C  amLODipine (NORVASC) 5 MG tablet Take 5 mg by mouth daily.    Historical Provider, MD  aspirin EC 81 MG EC tablet Take 1 tablet (81 mg total) by mouth daily. 06/23/14   Orson Eva, MD  atorvastatin (LIPITOR) 10 MG tablet Take 10 mg by mouth daily.    Historical Provider, MD  Azelastine HCl 0.15 % SOLN Place 1 spray into both nostrils 2 (two) times daily. 04/29/16   Adelina Mings, MD  BELBUCA 75 MCG FILM Take 1 Film by mouth See admin instructions. Place 1 film under the tongue one daily for 1 week, then 1 film under the tongue every 12 hours thereafter 04/03/16   Historical Provider, MD  cholecalciferol (VITAMIN D) 1000 UNITS tablet Take 1,000 Units by mouth daily.    Historical Provider, MD  dexlansoprazole (DEXILANT) 60 MG capsule Take 1 capsule (60 mg total) by mouth daily. 04/16/16   Jessica D Zehr, PA-C  diazepam (VALIUM) 5 MG tablet Take 2 tablets (10 mg total) by mouth 2 (two) times daily. 04/17/16   Norma Fredrickson, MD  folic acid (FOLVITE) 1 MG tablet Take 1 mg by mouth daily.    Historical Provider, MD  Levomilnacipran HCl ER 40 MG CP24 Take 40 mg by mouth daily. 06/02/16   Norma Fredrickson, MD  lidocaine (XYLOCAINE) 2 % solution Use as directed 15 mLs in the mouth or throat every 6 (six) hours as needed for mouth pain.  01/06/16   Carlisle Cater, PA-C  loratadine (CLARITIN) 10 MG tablet Take 1 tablet (10 mg total) by mouth daily. 03/21/15   Mercedes Street, PA-C  methocarbamol (ROBAXIN) 500 MG tablet Take 1 tablet (500 mg total) by mouth 2 (two) times daily. 01/03/16   Roxanna Mew, PA-C  metoCLOPramide (REGLAN) 10 MG tablet Take 1 tablet (10 mg total) by mouth every 6 (six) hours as needed for nausea (nausea/headache). 03/21/15   Mercedes Street, PA-C  Multiple Vitamins-Minerals (MULTIVITAMIN WITH MINERALS) tablet Take 1 tablet by mouth every morning.     Historical Provider, MD  naproxen (NAPROSYN) 500 MG tablet Take 1 tablet (500 mg total) by mouth 2 (two) times daily. 01/06/16   Carlisle Cater, PA-C  nebivolol (BYSTOLIC) 10 MG tablet Take 1 tablet (10 mg total) by mouth daily. 08/14/14   Tammy S Parrett, NP  nicotine (NICOTROL) 10 MG inhaler Inhale 1 continuous puffing into the lungs daily as needed  for smoking cessation.     Historical Provider, MD  nortriptyline (PAMELOR) 25 MG capsule Take 2 capsules (50 mg total) by mouth at bedtime. 2  qhs 01/10/16   Norma Fredrickson, MD  ondansetron (ZOFRAN) 4 MG tablet Take 1 tablet (4 mg total) by mouth every 8 (eight) hours as needed for nausea or vomiting. 05/23/15   Olivia Mackie Shuford, PA-C  pantoprazole (PROTONIX) 40 MG tablet Take 40 mg by mouth 2 (two) times daily.  06/25/14   Historical Provider, MD  potassium chloride SA (K-DUR,KLOR-CON) 20 MEQ tablet Take 20 mEq by mouth 2 (two) times daily.     Historical Provider, MD  QUEtiapine (SEROQUEL) 25 MG tablet 2  bid 04/17/16   Norma Fredrickson, MD  sucralfate (CARAFATE) 1 G tablet Take 1 tablet (1 g total) by mouth 3 (three) times daily with meals. Patient not taking: Reported on 04/16/2016 06/24/14   Orson Eva, MD  triamterene-hydrochlorothiazide (DYAZIDE) 50-25 MG capsule Take 1 capsule by mouth every morning.    Historical Provider, MD  valACYclovir (VALTREX) 1000 MG tablet Take 1,000 mg by mouth daily.     Historical Provider,  MD  vitamin B-12 (CYANOCOBALAMIN) 1000 MCG tablet Take 1,000 mcg by mouth daily.    Historical Provider, MD  zolpidem (AMBIEN CR) 12.5 MG CR tablet Take 1 tablet (12.5 mg total) by mouth at bedtime as needed for sleep. 04/17/16   Norma Fredrickson, MD    Family History Family History  Problem Relation Age of Onset  . Coronary artery disease Father   . Cancer Father     head neck   . Hypertension Mother   . Schizophrenia Mother   . Depression Brother   . Prostate cancer Brother   . Anesthesia problems Neg Hx   . Hypotension Neg Hx   . Malignant hyperthermia Neg Hx   . Pseudochol deficiency Neg Hx   . Allergic rhinitis Neg Hx   . Angioedema Neg Hx   . Asthma Neg Hx   . Atopy Neg Hx   . Eczema Neg Hx   . Immunodeficiency Neg Hx   . Urticaria Neg Hx     Social History Social History  Substance Use Topics  . Smoking status: Current Some Day Smoker    Packs/day: 0.10    Years: 30.00    Types: Cigarettes  . Smokeless tobacco: Never Used     Comment: using nicotrol inhaler  . Alcohol use No     Allergies   Aspirin; Effexor [venlafaxine hydrochloride]; Latex; Penicillins; Zithromax [azithromycin dihydrate]; Atorvastatin; Butrans [buprenorphine]; Codeine; Nucynta [tapentadol]; Chantix [varenicline tartrate]; Paroxetine hcl; and Tramadol   Review of Systems Review of Systems  Constitutional: Negative for chills, diaphoresis, fatigue and fever.  HENT: Negative for congestion, rhinorrhea and sneezing.   Eyes: Negative.   Respiratory: Negative for cough, chest tightness and shortness of breath.   Cardiovascular: Negative for chest pain and leg swelling.  Gastrointestinal: Positive for nausea. Negative for abdominal pain, blood in stool, diarrhea and vomiting.  Genitourinary: Negative for difficulty urinating, flank pain, frequency and hematuria.  Musculoskeletal: Negative for arthralgias and back pain.  Skin: Negative for rash.  Neurological: Positive for headaches (frontal).  Negative for dizziness, syncope, speech difficulty, weakness and numbness.     Physical Exam Updated Vital Signs BP 114/83 (BP Location: Right Arm)   Pulse 91   Temp 98.9 F (37.2 C) (Oral)   Resp 16   Ht 5\' 7"  (1.702 m)   Wt 227 lb (103 kg)  SpO2 100%   BMI 35.55 kg/m   Physical Exam  Constitutional: She is oriented to person, place, and time. She appears well-developed and well-nourished.  HENT:  Head: Normocephalic and atraumatic.  Eyes: Pupils are equal, round, and reactive to light.  Neck: Normal range of motion. Neck supple.  No meningismus  Cardiovascular: Normal rate, regular rhythm and normal heart sounds.   Pulmonary/Chest: Effort normal and breath sounds normal. No respiratory distress. She has no wheezes. She has no rales. She exhibits no tenderness.  Abdominal: Soft. Bowel sounds are normal. There is no tenderness. There is no rebound and no guarding.  Musculoskeletal: Normal range of motion. She exhibits no edema.  Lymphadenopathy:    She has no cervical adenopathy.  Neurological: She is alert and oriented to person, place, and time.  Motor 5/5 all extremities Sensation grossly intact to LT all extremities Finger to Nose intact, no pronator drift CN II-XII grossly intact Gait normal   Skin: Skin is warm and dry. No rash noted.  Psychiatric: She has a normal mood and affect.     ED Treatments / Results  DIAGNOSTIC STUDIES: Oxygen Saturation is 100% on RA, nl by my interpretation.    COORDINATION OF CARE: 2:15 PM Discussed treatment plan with pt at bedside which includes reglan, decadron, benadryl and pt agreed to plan.  Procedures Procedures (including critical care time)  Medications Ordered in ED Medications  metoCLOPramide (REGLAN) injection 10 mg (not administered)  dexamethasone (DECADRON) injection 10 mg (not administered)  diphenhydrAMINE (BENADRYL) injection 25 mg (not administered)     Initial Impression / Assessment and Plan / ED  Course  I have reviewed the triage vital signs and the nursing notes.  Patient presents with a migrainous headache after breathing chemicals. She states her nausea and dizziness has resolved after getting out of her house. However she still has a bifrontal headache. Her headache is consistent with her prior migrainous type headaches. She has no other symptoms that we more consistent with subarachnoid hemorrhage or meningitis. She was given a migraine cocktail in the ED. She states this typically relieves her symptoms. She was discharged home in good condition. She is not going back to her house until the fumes have cleared. Return precautions were given. She was advised to follow-up with her PCP if her symptoms are not improving.  Final Clinical Impressions(s) / ED Diagnoses   Final diagnoses:  Migraine without aura and without status migrainosus, not intractable  Exposure to chemical inhalation    New Prescriptions New Prescriptions   No medications on file   I personally performed the services described in this documentation, which was scribed in my presence.  The recorded information has been reviewed and considered.      Malvin Johns, MD 06/25/16 [phone]

## 2016-07-06 ENCOUNTER — Other Ambulatory Visit (HOSPITAL_COMMUNITY): Payer: Self-pay

## 2016-07-06 MED ORDER — DIAZEPAM 5 MG PO TABS: 10.0000 mg | ORAL_TABLET | Freq: Two times a day (BID) | ORAL | 0 refills | Status: DC

## 2016-07-06 NOTE — Progress Notes (Signed)
Patient called saying that the prescription of Valium that was given to her in January was never received by the pharmacy (she said she mailed it to them last month) I called Dr. Casimiro Needle and he agreed to call in enough to get her to that appointment. I called [facility] and gave them the verbal order for #32 tablets. Called patient back and let her know

## 2016-07-10 ENCOUNTER — Ambulatory Visit (INDEPENDENT_AMBULATORY_CARE_PROVIDER_SITE_OTHER): Payer: Medicaid Other | Admitting: Psychiatry

## 2016-07-10 ENCOUNTER — Ambulatory Visit (HOSPITAL_COMMUNITY): Payer: Self-pay | Admitting: Psychiatry

## 2016-07-10 DIAGNOSIS — Z79899 Other long term (current) drug therapy: Secondary | ICD-10-CM

## 2016-07-10 DIAGNOSIS — F17210 Nicotine dependence, cigarettes, uncomplicated: Secondary | ICD-10-CM | POA: Diagnosis not present

## 2016-07-10 DIAGNOSIS — F331 Major depressive disorder, recurrent, moderate: Secondary | ICD-10-CM | POA: Diagnosis not present

## 2016-07-10 DIAGNOSIS — Z7982 Long term (current) use of aspirin: Secondary | ICD-10-CM | POA: Diagnosis not present

## 2016-07-10 MED ORDER — LEVOMILNACIPRAN HCL ER 40 MG PO CP24: ORAL_CAPSULE | ORAL | 5 refills | Status: DC

## 2016-07-10 MED ORDER — ZOLPIDEM TARTRATE ER 12.5 MG PO TBCR: 12.5000 mg | EXTENDED_RELEASE_TABLET | Freq: Every evening | ORAL | 5 refills | Status: DC | PRN

## 2016-07-10 MED ORDER — QUETIAPINE FUMARATE 25 MG PO TABS: ORAL_TABLET | ORAL | 4 refills | Status: DC

## 2016-07-10 MED ORDER — DIAZEPAM 5 MG PO TABS: 10.0000 mg | ORAL_TABLET | Freq: Two times a day (BID) | ORAL | 3 refills | Status: DC

## 2016-07-10 MED ORDER — NORTRIPTYLINE HCL 25 MG PO CAPS: 50.0000 mg | ORAL_CAPSULE | Freq: Every day | ORAL | 8 refills | Status: DC

## 2016-07-10 NOTE — Progress Notes (Signed)
Patient ID: Gloria Lewis, female   DOB: 05/04/1955, 61 y.o.   MRN: [id number] [facility] MD Progress Note  07/10/2016 10:03 AM Gloria Lewis  MRN:  [id number] Subjective:  Shoulder hurting Principal Problem: Major Depression,recurent Mild Diagnosis: Major Depression, Recurent Today the patient is doing. a lot has happened but she still seems to be struggling getting by. She had a problem with bedbugs. This is now resolved. Gloria Lewis ?is not living with her and her while she still taking care of his finances but she'll figure out how to get. Patient says her mood is good. She is sleeping and eating well. She's good energy. She has no evidence of psychosis. The patient overdosed her pain clinic. She doesn't get any more opiates. Patient seems to be okay with it. Patient overall says she has bouts she gets very angry situations. Unfortunately she's not been able to start in therapy because of a conflict because someone else's using her name at the clinic. Nonetheless she takes her medicines regularly. Overall she's alert within friendly and engaging. Her thoughts are clear organized. She seems to be functioning very well. Patient Active Problem List   Diagnosis Date Noted  . Infectious gastroenteritis [A09] 04/16/2016  . Chronic rhinitis [J31.0] 07/22/2015  . Surgery, elective [Z41.9] 05/23/2015  . S/P arthroscopy of shoulder [Z98.890] 05/23/2015  . Chronic migraine without aura without status migrainosus, not intractable [G43.709] 10/18/2014  . Tobacco abuse [Z72.0] 10/18/2014  . Obesity [E66.9] 09/23/2014  . COPD [J44.9] 09/02/2014  . Pulmonary hypertension [I27.20] 08/31/2014  . Respiratory failure with hypoxia ([place]) [J96.91] 08/14/2014  . Cigarette smoker [F17.210] 07/28/2014  . Major depressive disorder, recurrent episode, moderate ([place]) [F33.1] 07/06/2014  . Essential hypertension [I10]   . SOB (shortness of breath) [R06.02] 06/21/2014  . Precordial pain [R07.2] 06/21/2014  .  Gastroesophageal reflux disease [K21.9] 06/21/2014  . Chest pain [R07.9] 06/21/2014  . HTN (hypertension) [I10]   . Neck pain [M54.2] 01/08/2014  . Major depressive disorder, recurrent episode, severe, without mention of psychotic behavior [F33.2] 10/14/2012  . Schizoaffective disorder ([place]) [F25.9] 05/26/2012  . 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 Hyperparathyroidism, primary ([place]) [E21.0] 10/06/2010  . DEGENERATIVE DISC DISEASE, LUMBOSACRAL SPINE [M51.37] 05/21/2007  . DERMATOPHYTOSIS OF THE BODY [B35.4] 05/10/2007  . 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 Anxiety state [F41.1] 03/24/2007  . DENTAL PAIN [K08.9] 03/24/2007  . SHOULDER PAIN, LEFT [M25.519] 03/24/2007   Total Time spent with patient:30 min  Past Psychiatric History:   Past Medical History:  Past Medical History:  Diagnosis Date  . Anginal pain ([place])    admit 06/2014; had non-ischemic stress test  . Anxiety   . Bipolar 1 disorder ([place])   . Colon polyp   . CTS (carpal tunnel syndrome)   . Depression   . Fever blister   . GERD (gastroesophageal reflux disease)   . HA (headache)   . HTN (hypertension)   . Hypercholesterolemia   . Migraines   . OA (osteoarthritis)   . Schizo-affective psychosis ([place])     Past Surgical History:  Procedure Laterality Date  . ANTERIOR CERVICAL DECOMP/DISCECTOMY FUSION  08/27/2011   Procedure: ANTERIOR CERVICAL DECOMPRESSION/DISCECTOMY FUSION 1 LEVEL/HARDWARE REMOVAL;  Surgeon: Eustace Moore, MD;  Location: [place] NEUR[facility] ORS;  Service: Neurosurgery;  Laterality: Bilateral;  Cervical four-five Anterior cervical decompression/diskectomy, fusion, Plate, Removal of Cervical five-seven Plate  . back injection    . CARDIAC CATHETERIZATION N/A 11/09/2014   Procedure: Right Heart Cath;  Surgeon: Kirk Ruths  Claris Gladden, MD;  Location: [place] CV LAB;  Service: Cardiovascular;  Laterality: N/A;  . CARPAL TUNNEL RELEASE  20110 rt/lt   rt x2 , lt x1  . HEMORRHOID SURGERY    .  MULTIPLE TOOTH EXTRACTIONS    . NECK SURGERY  2009  . PITUITARY SURGERY     Had gland removed from producing too much calcium  . polp removed  2011  . SHOULDER ARTHROSCOPY WITH ROTATOR CUFF REPAIR Right 05/23/2015   Procedure: RIGHT SHOULDER ARTHROSCOPY WITH REMOVAL OF SUTURE ANCHOR AND POSSIBLE REVISION ROTATOR CUFF REPAIR;  Surgeon: Justice Britain, MD;  Location: [facility];  Service: Orthopedics;  Laterality: Right;  . SHOULDER ARTHROSCOPY WITH SUBACROMIAL DECOMPRESSION Right 01/24/2015   Procedure: RIGHT SHOULDER ARTHROSCOPY WITH SUBACROMIAL DECOMPRESSION AD DISTAL CLAVICLE RESECTION ;  Surgeon: Justice Britain, MD;  Location: [facility];  Service: Orthopedics;  Laterality: Right;  Marland Kitchen VAGINAL DELIVERY     x3   Family History:  Family History  Problem Relation Age of Onset  . Coronary artery disease Father   . Cancer Father     head neck   . Hypertension Mother   . Schizophrenia Mother   . Depression Brother   . Prostate cancer Brother   . Anesthesia problems Neg Hx   . Hypotension Neg Hx   . Malignant hyperthermia Neg Hx   . Pseudochol deficiency Neg Hx   . Allergic rhinitis Neg Hx   . Angioedema Neg Hx   . Asthma Neg Hx   . Atopy Neg Hx   . Eczema Neg Hx   . Immunodeficiency Neg Hx   . Urticaria Neg Hx    Family Psychiatric  History:  Social History:  History  Alcohol Use No     History  Drug Use No    Social History   Social History  . Marital status: Single    Spouse name: N/A  . Number of children: 3  . Years of education: N/A   Occupational History  . disabled/retired    Social History Main Topics  . Smoking status: Current Some Day Smoker    Packs/day: 0.10    Years: 30.00    Types: Cigarettes  . Smokeless tobacco: Never Used     Comment: using nicotrol inhaler  . Alcohol use No  . Drug use: No  . Sexual activity: No   Other Topics Concern  . Not on file   Social History Narrative  . No narrative on file   Additional Social History:                          Sleep: Good  Appetite:  Fair  Current Medications: Current Outpatient Prescriptions  Medication Sig Dispense Refill  . acetaminophen (TYLENOL) 325 MG tablet Take 2 tablets (650 mg total) by mouth every 6 (six) hours as needed. 30 tablet 0  . amLODipine (NORVASC) 5 MG tablet Take 5 mg by mouth daily.    Marland Kitchen aspirin EC 81 MG EC tablet Take 1 tablet (81 mg total) by mouth daily. 30 tablet 0  . atorvastatin (LIPITOR) 10 MG tablet Take 10 mg by mouth daily.    . Azelastine HCl 0.15 % SOLN Place 1 spray into both nostrils 2 (two) times daily. 30 mL 0  . BELBUCA 75 MCG FILM Take 1 Film by mouth See admin instructions. Place 1 film under the tongue one daily for 1 week, then 1 film under the tongue every 12  hours thereafter  0  . cholecalciferol (VITAMIN D) 1000 UNITS tablet Take 1,000 Units by mouth daily.    Marland Kitchen dexlansoprazole (DEXILANT) 60 MG capsule Take 1 capsule (60 mg total) by mouth daily. 30 capsule 3  . diazepam (VALIUM) 5 MG tablet Take 2 tablets (10 mg total) by mouth 2 (two) times daily. 494 tablet 3  . folic acid (FOLVITE) 1 MG tablet Take 1 mg by mouth daily.    . Levomilnacipran HCl ER 40 MG CP24 2 qam 60 capsule 5  . lidocaine (XYLOCAINE) 2 % solution Use as directed 15 mLs in the mouth or throat every 6 (six) hours as needed for mouth pain. 100 mL 0  . loratadine (CLARITIN) 10 MG tablet Take 1 tablet (10 mg total) by mouth daily. 30 tablet 0  . methocarbamol (ROBAXIN) 500 MG tablet Take 1 tablet (500 mg total) by mouth 2 (two) times daily. 20 tablet 0  . metoCLOPramide (REGLAN) 10 MG tablet Take 1 tablet (10 mg total) by mouth every 6 (six) hours as needed for nausea (nausea/headache). 6 tablet 0  . Multiple Vitamins-Minerals (MULTIVITAMIN WITH MINERALS) tablet Take 1 tablet by mouth every morning.     . naproxen (NAPROSYN) 500 MG tablet Take 1 tablet (500 mg total) by mouth 2 (two) times daily. 30 tablet 0  . nebivolol (BYSTOLIC) 10 MG tablet Take 1 tablet (10 mg  total) by mouth daily. 30 tablet 1  . nicotine (NICOTROL) 10 MG inhaler Inhale 1 continuous puffing into the lungs daily as needed for smoking cessation.     . nortriptyline (PAMELOR) 25 MG capsule Take 2 capsules (50 mg total) by mouth at bedtime. 2  qhs 60 capsule 8  . ondansetron (ZOFRAN) 4 MG tablet Take 1 tablet (4 mg total) by mouth every 8 (eight) hours as needed for nausea or vomiting. 20 tablet 0  . pantoprazole (PROTONIX) 40 MG tablet Take 40 mg by mouth 2 (two) times daily.   12  . potassium chloride SA (K-DUR,KLOR-CON) 20 MEQ tablet Take 20 mEq by mouth 2 (two) times daily.     . QUEtiapine (SEROQUEL) 25 MG tablet 2  bid 120 tablet 4  . sucralfate (CARAFATE) 1 G tablet Take 1 tablet (1 g total) by mouth 3 (three) times daily with meals. (Patient not taking: Reported on 04/16/2016) 90 tablet 0  . triamterene-hydrochlorothiazide (DYAZIDE) 50-25 MG capsule Take 1 capsule by mouth every morning.    . valACYclovir (VALTREX) 1000 MG tablet Take 1,000 mg by mouth daily.     . vitamin B-12 (CYANOCOBALAMIN) 1000 MCG tablet Take 1,000 mcg by mouth daily.    Marland Kitchen zolpidem (AMBIEN CR) 12.5 MG CR tablet Take 1 tablet (12.5 mg total) by mouth at bedtime as needed for sleep. 30 tablet 5   No current facility-administered medications for this visit.     Lab Results: No results found for this or any previous visit (from the past 48 hour(s)).  Physical Findings: AIMS:  , ,  ,  ,    CIWA:    COWS:     Musculoskeletal: Strength & Muscle Tone: within normal limits Gait & Station: normal Patient leans: N/A  Psychiatric Specialty Exam: ROS  There were no vitals taken for this visit.There is no height or weight on file to calculate BMI.  General Appearance: Casual  Eye Contact::  Good  Speech:  Clear and Coherent  Volume:  Normal  Mood:  Euthymic  Affect:  Congruent  Thought Process:  Coherent  Orientation:  Full (Time, Place, and Person)  Thought Content:  WDL  Suicidal Thoughts:  No   Homicidal Thoughts:  No  Memory:  NA  Judgement:  Good  Insight:  Fair  Psychomotor Activity:  Normal  Concentration:  Fair  Recall:  Good  Fund of Knowledge:Good  Language: Good  Akathisia:  No  Handed:  Right  AIMS (if indicated):     Assets:   ADL's:  Intact  Cognition: WNL  Sleep:       Treatment Plan  07/10/2016, 10:03 AM At this time the patient will continue all the medication she's taking. This includes Fetzim 40 mg 2 in the morning, Ambien 12.5 mg CR at night, Seroquel 50 mg twice a day, nortriptyline 50 mg, without 5 mg 2 pills twice a day. Over the next few months we'll attempt to reduce her Valium. Overall given all the circumstances and situation is patient is going through I think she is actually doing very well. She demonstrates no psychosis. She is a combination of nortriptyline abnd Fetzima aciduria antidepressants. I think her Seroquel also is very helpful in this regard. This patient to return to see me in 3 months for a 30 minute visit. Patient denies much chest pain or abdominal pain she denies any neurological symptoms

## 2016-07-17 ENCOUNTER — Ambulatory Visit (HOSPITAL_COMMUNITY): Payer: Self-pay | Admitting: Psychiatry

## 2016-09-03 ENCOUNTER — Telehealth (HOSPITAL_COMMUNITY): Payer: Self-pay

## 2016-09-03 DIAGNOSIS — F331 Major depressive disorder, recurrent, moderate: Secondary | ICD-10-CM

## 2016-09-03 NOTE — Telephone Encounter (Signed)
Patient is calling stating that she is stressed and not sleeping. Her anxiety is increased as are her OCD symptoms. Patient would like to know what to do. She returns to you on 7/6. Please review and advise, thank you

## 2016-09-11 MED ORDER — QUETIAPINE FUMARATE 100 MG PO TABS: 100.0000 mg | ORAL_TABLET | Freq: Two times a day (BID) | ORAL | 1 refills | Status: DC

## 2016-09-11 NOTE — Telephone Encounter (Signed)
Medication management -  Telephone call with Dr. Casimiro Needle to verify increased Seroquel order to 100 mg once twice a day, #60 with 1 refill as this is what patient's insurance is most likely to cover.  Dr. Casimiro Needle provided this verbal order and new prescription e-scribed to patient's [facility] with these instructions. Telephone call with patient after speaking with Dr. Casimiro Needle to inform he approved an increase in her Seroquel to 100 mg, one twice a day with one refill and patient to call back if any problems with new dosage.

## 2016-09-12 ENCOUNTER — Other Ambulatory Visit: Payer: Self-pay | Admitting: Gastroenterology

## 2016-10-09 ENCOUNTER — Ambulatory Visit (HOSPITAL_COMMUNITY): Payer: Self-pay | Admitting: Psychiatry

## 2016-10-30 ENCOUNTER — Ambulatory Visit (HOSPITAL_COMMUNITY): Payer: Self-pay | Admitting: Psychiatry

## 2016-10-30 ENCOUNTER — Other Ambulatory Visit (HOSPITAL_COMMUNITY): Payer: Self-pay

## 2016-10-30 MED ORDER — DIAZEPAM 5 MG PO TABS: 10.0000 mg | ORAL_TABLET | Freq: Two times a day (BID) | ORAL | 3 refills | Status: DC

## 2016-11-17 ENCOUNTER — Other Ambulatory Visit (HOSPITAL_COMMUNITY): Payer: Self-pay

## 2016-11-17 MED ORDER — QUETIAPINE FUMARATE 200 MG PO TABS: 400.0000 mg | ORAL_TABLET | Freq: Every day | ORAL | 2 refills | Status: DC

## 2016-12-02 ENCOUNTER — Encounter (HOSPITAL_COMMUNITY): Payer: Self-pay | Admitting: Psychiatry

## 2016-12-02 ENCOUNTER — Ambulatory Visit (INDEPENDENT_AMBULATORY_CARE_PROVIDER_SITE_OTHER): Payer: Medicaid Other | Admitting: Psychiatry

## 2016-12-02 VITALS — BP 133/88 | HR 102 | Ht 65.25 in | Wt 240.0 lb

## 2016-12-02 DIAGNOSIS — F333 Major depressive disorder, recurrent, severe with psychotic symptoms: Secondary | ICD-10-CM | POA: Diagnosis not present

## 2016-12-02 DIAGNOSIS — Z634 Disappearance and death of family member: Secondary | ICD-10-CM

## 2016-12-02 DIAGNOSIS — Z818 Family history of other mental and behavioral disorders: Secondary | ICD-10-CM | POA: Diagnosis not present

## 2016-12-02 DIAGNOSIS — F17210 Nicotine dependence, cigarettes, uncomplicated: Secondary | ICD-10-CM | POA: Diagnosis not present

## 2016-12-02 MED ORDER — TRAZODONE HCL 50 MG PO TABS: ORAL_TABLET | ORAL | 5 refills | Status: DC
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
## 2016-12-03 ENCOUNTER — Other Ambulatory Visit (HOSPITAL_COMMUNITY): Payer: Self-pay

## 2016-12-04 ENCOUNTER — Telehealth: Payer: Self-pay | Admitting: Internal Medicine

## 2016-12-04 ENCOUNTER — Telehealth: Payer: Self-pay | Admitting: Gastroenterology

## 2016-12-04 MED ORDER — DEXLANSOPRAZOLE 60 MG PO CPDR: 1.0000 | DELAYED_RELEASE_CAPSULE | Freq: Every day | ORAL | 3 refills | Status: DC

## 2016-12-04 NOTE — Telephone Encounter (Signed)
Called by patient regarding her Dexilant Prescription was called to the pharmacy, I spoke to the pharmacist but patient needs prior authorization despite the fact that this is not new. Medicaid requirement Therefore she can either purchase with cash price and be reimbursed by Medicaid  I discussed this with her and she cannot afford this She will try Prevacid 15-30 mg every 12 hours until we can complete prior authorization

## 2016-12-04 NOTE — Telephone Encounter (Signed)
Medication refilled # 30 with 3 refills, pt have an upcoming scheduled appt with Janett Billow 12/2016

## 2016-12-04 NOTE — Telephone Encounter (Signed)
Pt is calling back about her Dexilant

## 2016-12-08 NOTE — Telephone Encounter (Signed)
Routed to [place], [place]

## 2016-12-10 ENCOUNTER — Telehealth: Payer: Self-pay | Admitting: Gastroenterology

## 2016-12-10 NOTE — Telephone Encounter (Signed)
Prior auth was sent pt aware we are waiting for a response from the insurance.  She was advised it may take 48 hours.

## 2016-12-16 ENCOUNTER — Other Ambulatory Visit: Payer: Self-pay

## 2016-12-16 ENCOUNTER — Other Ambulatory Visit (HOSPITAL_COMMUNITY): Payer: Self-pay | Admitting: Psychiatry

## 2016-12-16 ENCOUNTER — Ambulatory Visit (INDEPENDENT_AMBULATORY_CARE_PROVIDER_SITE_OTHER): Payer: Medicaid Other | Admitting: Gastroenterology

## 2016-12-16 ENCOUNTER — Encounter: Payer: Self-pay | Admitting: Gastroenterology

## 2016-12-16 ENCOUNTER — Telehealth: Payer: Self-pay | Admitting: Gastroenterology

## 2016-12-16 VITALS — BP 120/80 | HR 88 | Ht 67.0 in | Wt 247.8 lb

## 2016-12-16 DIAGNOSIS — K219 Gastro-esophageal reflux disease without esophagitis: Secondary | ICD-10-CM

## 2016-12-16 MED ORDER — OMEPRAZOLE 40 MG PO CPDR: 40.0000 mg | DELAYED_RELEASE_CAPSULE | Freq: Two times a day (BID) | ORAL | 3 refills | Status: DC
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
## 2016-12-16 NOTE — Patient Instructions (Signed)
We will call you in regards to your Dexilant.

## 2016-12-16 NOTE — Telephone Encounter (Signed)
prescription sent as requested.

## 2016-12-17 ENCOUNTER — Other Ambulatory Visit (HOSPITAL_COMMUNITY): Payer: Self-pay

## 2016-12-17 MED ORDER — TRAZODONE HCL 50 MG PO TABS: ORAL_TABLET | ORAL | 5 refills | Status: DC

## 2016-12-17 MED ORDER — QUETIAPINE FUMARATE 200 MG PO TABS: 400.0000 mg | ORAL_TABLET | Freq: Every day | ORAL | 2 refills | Status: DC

## 2016-12-18 ENCOUNTER — Other Ambulatory Visit (HOSPITAL_COMMUNITY): Payer: Self-pay | Admitting: Psychiatry

## 2016-12-30 ENCOUNTER — Ambulatory Visit (HOSPITAL_COMMUNITY): Payer: Self-pay | Admitting: Psychiatry

## 2017-01-08 ENCOUNTER — Ambulatory Visit (HOSPITAL_COMMUNITY): Payer: Self-pay | Admitting: Psychiatry

## 2017-01-12 ENCOUNTER — Other Ambulatory Visit: Payer: Self-pay | Admitting: Gastroenterology

## 2017-01-12 ENCOUNTER — Telehealth: Payer: Self-pay | Admitting: Gastroenterology

## 2017-01-12 NOTE — Telephone Encounter (Signed)
Social worker and talked to [place] about Dexilant needing a PA and there is a mixup in the chart whether pt is taking Dexilant or omeprazole since both medications is still on pt's med record. Told the pharmacy I would call the pt to see what's going on. Called the pt and she stated the she is on Dexilant and omeprazole did not help her. She stated she just picked up some Dexilant and she have enough until she needs another refill, she will call and let us know. [place] back and talked to a female (I forgot her name) she consulted with [facility] and stated that they had Omeprazole and Dexilant on file for the pt. I told them to cancel the omeprazole and the pt will call back when she need a refill on Dexilant, they stated they will keep it on file for the pt.

## 2017-01-13 ENCOUNTER — Telehealth: Payer: Self-pay | Admitting: Gastroenterology

## 2017-01-13 MED ORDER — DEXLANSOPRAZOLE 60 MG PO CPDR: 60.0000 mg | DELAYED_RELEASE_CAPSULE | Freq: Every day | ORAL | 3 refills | Status: DC

## 2017-01-13 NOTE — Telephone Encounter (Signed)
Pt called back today stating that she thought the order sent in the mail from [place] was her [place] but it wasn't and need [facility]. Dexilant sent in.
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
## 2017-01-18 ENCOUNTER — Telehealth: Payer: Self-pay | Admitting: Gastroenterology

## 2017-01-19 MED ORDER — DEXLANSOPRAZOLE 60 MG PO CPDR: 60.0000 mg | DELAYED_RELEASE_CAPSULE | Freq: Every day | ORAL | 3 refills | Status: DC

## 2017-01-19 NOTE — Telephone Encounter (Signed)
Medication sent in error to [facility] on 01/13/2017. Sent in [facility] to [facility].

## 2017-01-19 NOTE — Telephone Encounter (Signed)
Patient calling back regarding this states she needs [facility] to [facility] on [address] today.

## 2017-01-21 ENCOUNTER — Other Ambulatory Visit: Payer: Self-pay | Admitting: Internal Medicine

## 2017-01-21 DIAGNOSIS — Z1231 Encounter for screening mammogram for malignant neoplasm of breast: Secondary | ICD-10-CM

## 2017-01-21 NOTE — Telephone Encounter (Signed)
Patient states that her insurance is requiring a prior auth for dexilant. [employer] # [phone]

## 2017-01-22 ENCOUNTER — Ambulatory Visit
Admission: RE | Admit: 2017-01-22 | Discharge: 2017-01-22 | Disposition: A | Discharge status: Home / Self Care | Payer: Medicaid Other | Source: Ambulatory Visit | Attending: Internal Medicine | Admitting: Internal Medicine

## 2017-01-22 ENCOUNTER — Telehealth: Payer: Self-pay

## 2017-01-22 DIAGNOSIS — Z1231 Encounter for screening mammogram for malignant neoplasm of breast: Secondary | ICD-10-CM

## 2017-01-22 NOTE — Telephone Encounter (Signed)
Did a prior authorization thru [employer] at [id number] for Dexilant 60 mg, one daily.  She has tried and failed omeprazole and pantoprazole. It was approved for a year by [place]. Approval #[id number].  [facility].  Left detailed message on patient's voicemail of prior authorization approval.

## 2017-02-10 ENCOUNTER — Ambulatory Visit: Payer: Self-pay | Admitting: Internal Medicine

## 2017-02-16 ENCOUNTER — Other Ambulatory Visit (HOSPITAL_COMMUNITY): Payer: Self-pay | Admitting: Psychiatry

## 2017-02-18 ENCOUNTER — Other Ambulatory Visit (HOSPITAL_COMMUNITY): Payer: Self-pay | Admitting: Psychiatry

## 2017-03-12 ENCOUNTER — Other Ambulatory Visit: Payer: Self-pay

## 2017-03-12 ENCOUNTER — Encounter (HOSPITAL_COMMUNITY): Payer: Self-pay | Admitting: Psychiatry

## 2017-03-12 ENCOUNTER — Ambulatory Visit (INDEPENDENT_AMBULATORY_CARE_PROVIDER_SITE_OTHER): Payer: Medicaid Other | Admitting: Psychiatry

## 2017-03-12 VITALS — BP 132/77 | HR 98 | Temp 98.3°F | Wt 253.8 lb

## 2017-03-12 DIAGNOSIS — Z818 Family history of other mental and behavioral disorders: Secondary | ICD-10-CM

## 2017-03-12 DIAGNOSIS — F3342 Major depressive disorder, recurrent, in full remission: Secondary | ICD-10-CM

## 2017-03-12 DIAGNOSIS — F17210 Nicotine dependence, cigarettes, uncomplicated: Secondary | ICD-10-CM

## 2017-03-12 MED ORDER — ZOLPIDEM TARTRATE ER 12.5 MG PO TBCR: 12.5000 mg | EXTENDED_RELEASE_TABLET | Freq: Every evening | ORAL | 3 refills | Status: DC | PRN

## 2017-03-12 MED ORDER — NORTRIPTYLINE HCL 25 MG PO CAPS: 50.0000 mg | ORAL_CAPSULE | Freq: Every day | ORAL | 8 refills | Status: DC

## 2017-03-12 MED ORDER — QUETIAPINE FUMARATE 25 MG PO TABS: 400.0000 mg | ORAL_TABLET | Freq: Every day | ORAL | 3 refills | Status: DC

## 2017-03-12 NOTE — Progress Notes (Signed)
Patient ID: Gloria Lewis, female   DOB: [birth year], 61 y.o.   MRN: [id number] [facility] MD Progress Note  03/12/2017 10:47 AM Gloria Lewis  MRN:  [id number] Subjective:  Shoulder hurting Principal Problem: Major Depression,recurent Mild Diagnosis: Major Depression, Recurent  Today the patient is actually doing fairly well. She did miss appointments with me but she seems to be functioning and is seems to grieve the death of her child. The patient is doing great in terms of getting back to the workplace.She's trying to be employed. She is sleeping and eating well. Despite the fact that we have adjustedher rather she's adjusted some of her medicines. She actually reduced her Seroquel from 200 mg to taking approximately 50 mg a day. The patient did not get her Ambien and will go ahead and prescribe for her.Fortunately her antidepressantsmFetzima never got called in. On the other hand she is taking her nortriptyline as prescribed and if it is very helpful for her. Unfortunately also her therapist has retired. The patient actually is functioning very well. She's very engaging. She denies depression or anxiety this time. She doesn't use drugs or alcohol at this time. She's actually very stable. She is looking forward to the holidays. Patient Active Problem List   Diagnosis Date Noted  . Infectious gastroenteritis [A09] 04/16/2016  . Surgery, elective [Z41.9] 05/23/2015  . S/P arthroscopy of shoulder [Z98.890] 05/23/2015  . Chronic migraine without aura without status migrainosus, not intractable [G43.709] 10/18/2014  . Tobacco abuse [Z72.0] 10/18/2014  . Obesity [E66.9] 09/23/2014  . COPD [J44.9] 09/02/2014  . Pulmonary hypertension ([place]) [I27.20] 08/31/2014  . Respiratory failure with hypoxia ([place]) [J96.91] 08/14/2014  . Cigarette smoker [F17.210] 07/28/2014  . Major depressive disorder, recurrent episode, moderate ([place]) [F33.1] 07/06/2014  . Essential hypertension [I10]   . SOB (shortness of  breath) [R06.02] 06/21/2014  . Precordial pain [R07.2] 06/21/2014  . Gastroesophageal reflux disease [K21.9] 06/21/2014  . Chest pain [R07.9] 06/21/2014  . HTN (hypertension) [I10]   . Neck pain [M54.2] 01/08/2014  . Major depressive disorder, recurrent episode, severe, without mention of psychotic behavior [F33.2] 10/14/2012  . Schizoaffective disorder ([place]) [F25.9] 05/26/2012  . 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 Hyperparathyroidism, primary ([place]) [E21.0] 10/06/2010  . DEGENERATIVE DISC DISEASE, LUMBOSACRAL SPINE [M51.37] 05/21/2007  . DERMATOPHYTOSIS OF THE BODY [B35.4] 05/10/2007  . 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 Anxiety state [F41.1] 03/24/2007  . DENTAL PAIN [K08.9] 03/24/2007  . SHOULDER PAIN, LEFT [M25.519] 03/24/2007   Total Time spent with patient:30 min  Past Psychiatric History:   Past Medical History:  Past Medical History:  Diagnosis Date  . Anginal pain ([place])    admit 06/2014; had non-ischemic stress test  . Anxiety   . Bipolar 1 disorder ([place])   . Colon polyp   . CTS (carpal tunnel syndrome)   . Depression   . Fever blister   . GERD (gastroesophageal reflux disease)   . HA (headache)   . HTN (hypertension)   . Hypercholesterolemia   . Migraines   . OA (osteoarthritis)   . Schizo-affective psychosis ([place])     Past Surgical History:  Procedure Laterality Date  . ANTERIOR CERVICAL DECOMP/DISCECTOMY FUSION  08/27/2011   Procedure: ANTERIOR CERVICAL DECOMPRESSION/DISCECTOMY FUSION 1 LEVEL/HARDWARE REMOVAL;  Surgeon: Eustace Moore, MD;  Location: [place] [facility];  Service: Neurosurgery;  Laterality: Bilateral;  Cervical four-five Anterior cervical decompression/diskectomy, fusion, Plate, Removal of Cervical five-seven Plate  . back injection    . CARDIAC CATHETERIZATION  N/A 11/09/2014   Procedure: Right Heart Cath;  Surgeon: Larey Dresser, MD;  Location: [place] CV LAB;  Service: Cardiovascular;  Laterality: N/A;  . CARPAL TUNNEL  RELEASE  20110 rt/lt   rt x2 , lt x1  . HEMORRHOID SURGERY    . MULTIPLE TOOTH EXTRACTIONS    . NECK SURGERY  2009  . PITUITARY SURGERY     Had gland removed from producing too much calcium  . polp removed  2011  . SHOULDER ARTHROSCOPY WITH ROTATOR CUFF REPAIR Right 05/23/2015   Procedure: RIGHT SHOULDER ARTHROSCOPY WITH REMOVAL OF SUTURE ANCHOR AND POSSIBLE REVISION ROTATOR CUFF REPAIR;  Surgeon: Justice Britain, MD;  Location: [facility];  Service: Orthopedics;  Laterality: Right;  . SHOULDER ARTHROSCOPY WITH SUBACROMIAL DECOMPRESSION Right 01/24/2015   Procedure: RIGHT SHOULDER ARTHROSCOPY WITH SUBACROMIAL DECOMPRESSION AD DISTAL CLAVICLE RESECTION ;  Surgeon: Justice Britain, MD;  Location: [facility];  Service: Orthopedics;  Laterality: Right;  Marland Kitchen VAGINAL DELIVERY     x3   Family History:  Family History  Problem Relation Age of Onset  . Coronary artery disease Father   . Cancer Father        head neck   . Hypertension Mother   . Schizophrenia Mother   . Depression Brother   . Prostate cancer Brother   . Anesthesia problems Neg Hx   . Hypotension Neg Hx   . Malignant hyperthermia Neg Hx   . Pseudochol deficiency Neg Hx   . Allergic rhinitis Neg Hx   . Angioedema Neg Hx   . Asthma Neg Hx   . Atopy Neg Hx   . Eczema Neg Hx   . Immunodeficiency Neg Hx   . Urticaria Neg Hx    Family Psychiatric  History:  Social History:  Social History   Substance and Sexual Activity  Alcohol Use No  . Alcohol/week: 0.0 oz     Social History   Substance and Sexual Activity  Drug Use No    Social History   Socioeconomic History  . Marital status: Single    Spouse name: None  . Number of children: 3  . Years of education: None  . Highest education level: Some college, no degree  Social Needs  . Financial resource strain: Somewhat hard  . Food insecurity - worry: Sometimes true  . Food insecurity - inability: Sometimes true  . Transportation needs - medical: Yes  . Transportation needs -  non-medical: Yes  Occupational History  . Occupation: disabled/retired  Tobacco Use  . Smoking status: Current Some Day Smoker    Packs/day: 0.10    Years: 30.00    Pack years: 3.00    Types: Cigarettes  . Smokeless tobacco: Never Used  . Tobacco comment: using nicotrol inhaler  Substance and Sexual Activity  . Alcohol use: No    Alcohol/week: 0.0 oz  . Drug use: No  . Sexual activity: No  Other Topics Concern  . None  Social History Narrative  . None   Additional Social History:                         Sleep: Good  Appetite:  Fair  Current Medications: Current Outpatient Medications  Medication Sig Dispense Refill  . acetaminophen (TYLENOL) 325 MG tablet Take 2 tablets (650 mg total) by mouth every 6 (six) hours as needed. 30 tablet 0  . amLODipine (NORVASC) 5 MG tablet Take 5 mg by mouth daily.    Marland Kitchen  aspirin EC 81 MG EC tablet Take 1 tablet (81 mg total) by mouth daily. 30 tablet 0  . atorvastatin (LIPITOR) 10 MG tablet Take 10 mg by mouth daily.    . Azelastine HCl 0.15 % SOLN Place 1 spray into both nostrils 2 (two) times daily. 30 mL 0  . BELBUCA 75 MCG FILM Take 1 Film by mouth See admin instructions. Place 1 film under the tongue one daily for 1 week, then 1 film under the tongue every 12 hours thereafter  0  . cholecalciferol (VITAMIN D) 1000 UNITS tablet Take 1,000 Units by mouth daily.    Marland Kitchen dexlansoprazole (DEXILANT) 60 MG capsule Take 1 capsule (60 mg total) by mouth daily. 30 capsule 3  . folic acid (FOLVITE) 1 MG tablet Take 1 mg by mouth daily.    . Levomilnacipran HCl ER 40 MG CP24 2 qam 60 capsule 5  . lidocaine (XYLOCAINE) 2 % solution Use as directed 15 mLs in the mouth or throat every 6 (six) hours as needed for mouth pain. 100 mL 0  . loratadine (CLARITIN) 10 MG tablet Take 1 tablet (10 mg total) by mouth daily. 30 tablet 0  . methocarbamol (ROBAXIN) 500 MG tablet Take 1 tablet (500 mg total) by mouth 2 (two) times daily. 20 tablet 0  .  metoCLOPramide (REGLAN) 10 MG tablet Take 1 tablet (10 mg total) by mouth every 6 (six) hours as needed for nausea (nausea/headache). 6 tablet 0  . Multiple Vitamins-Minerals (MULTIVITAMIN WITH MINERALS) tablet Take 1 tablet by mouth every morning.     . naproxen (NAPROSYN) 500 MG tablet Take 1 tablet (500 mg total) by mouth 2 (two) times daily. 30 tablet 0  . nicotine (NICOTROL) 10 MG inhaler Inhale 1 continuous puffing into the lungs daily as needed for smoking cessation.     . nortriptyline (PAMELOR) 25 MG capsule Take 2 capsules (50 mg total) by mouth at bedtime. 2  qhs 60 capsule 8  . ondansetron (ZOFRAN) 4 MG tablet Take 1 tablet (4 mg total) by mouth every 8 (eight) hours as needed for nausea or vomiting. 20 tablet 0  . potassium chloride SA (K-DUR,KLOR-CON) 20 MEQ tablet Take 20 mEq by mouth 2 (two) times daily.     . QUEtiapine (SEROQUEL) 25 MG tablet Take 16 tablets (400 mg total) by mouth at bedtime. 1  Bid   1 prn 90 tablet 3  . sucralfate (CARAFATE) 1 G tablet Take 1 tablet (1 g total) by mouth 3 (three) times daily with meals. 90 tablet 0  . traZODone (DESYREL) 50 MG tablet 1 Qam  1 q diner 60 tablet 5  . triamterene-hydrochlorothiazide (DYAZIDE) 50-25 MG capsule Take 1 capsule by mouth every morning.    . valACYclovir (VALTREX) 1000 MG tablet Take 1,000 mg by mouth daily.     . vitamin B-12 (CYANOCOBALAMIN) 1000 MCG tablet Take 1,000 mcg by mouth daily.    Marland Kitchen zolpidem (AMBIEN CR) 12.5 MG CR tablet Take 1 tablet (12.5 mg total) by mouth at bedtime as needed for sleep. 30 tablet 3   No current facility-administered medications for this visit.     Lab Results: No results found for this or any previous visit (from the past 48 hour(s)).  Physical Findings: AIMS:  , ,  ,  ,    CIWA:    COWS:     Musculoskeletal: Strength & Muscle Tone: within normal limits Gait & Station: normal Patient leans: N/A  Psychiatric Specialty Exam: ROS  Blood pressure 132/77, pulse 98, temperature  98.3 F (36.8 C), temperature source Oral, weight 253 lb 12.8 oz (115.1 kg).Body mass index is 39.75 kg/m.  General Appearance: Casual  Eye Contact::  Good  Speech:  Clear and Coherent  Volume:  Normal  Mood:  Euthymic  Affect:  Congruent  Thought Process:  Coherent  Orientation:  Full (Time, Place, and Person)  Thought Content:  WDL  Suicidal Thoughts:  No  Homicidal Thoughts:  No  Memory:  NA  Judgement:  Good  Insight:  Fair  Psychomotor Activity:  Normal  Concentration:  Fair  Recall:  Good  Fund of Knowledge:Good  Language: Good  Akathisia:  No  Handed:  Right  AIMS (if indicated):     Assets:   ADL's:  Intact  Cognition: WNL  Sleep:       Treatment Plan  03/12/2017, 10:47 AM At this time we'll adjust her Seroquel.he'll take a 25 mg pill 1 twice a day take 1 after initiation needed perhaps to help her sleep. She'll continue taking nortriptyline as prescribed.Will prescribe Ambien 12.5 CR for sleep. Today we'll make arrangements to get the name of the therapist seen. Overall she's actually quite stable. Her mood is contained she does not use drugs or alcohol. She certainly is not suicidal. She'll return to see me in approximately 2 months.

## 2017-03-23 ENCOUNTER — Other Ambulatory Visit (HOSPITAL_COMMUNITY): Payer: Self-pay

## 2017-03-23 MED ORDER — QUETIAPINE FUMARATE 25 MG PO TABS: ORAL_TABLET | ORAL | 3 refills | Status: DC

## 2017-04-16 ENCOUNTER — Other Ambulatory Visit (HOSPITAL_COMMUNITY): Payer: Self-pay | Admitting: Psychiatry

## 2017-04-23 ENCOUNTER — Other Ambulatory Visit (HOSPITAL_COMMUNITY): Payer: Self-pay

## 2017-04-23 MED ORDER — NORTRIPTYLINE HCL 25 MG PO CAPS: 50.0000 mg | ORAL_CAPSULE | Freq: Every day | ORAL | 8 refills | Status: DC

## 2017-05-21 ENCOUNTER — Ambulatory Visit (HOSPITAL_COMMUNITY): Payer: Medicaid Other | Admitting: Psychiatry

## 2017-05-25 ENCOUNTER — Telehealth: Payer: Self-pay | Admitting: *Deleted

## 2017-05-25 ENCOUNTER — Telehealth: Payer: Self-pay

## 2017-05-25 NOTE — Telephone Encounter (Signed)
SENT REFERRAL TO SCHEDULING 

## 2017-05-25 NOTE — Telephone Encounter (Signed)
Referral fax from [facility] sent to Scheduling

## 2017-05-26 ENCOUNTER — Telehealth: Payer: Self-pay

## 2017-05-26 NOTE — Telephone Encounter (Signed)
SENT REFERRAL TO SCHEDULING 

## 2017-05-28 ENCOUNTER — Ambulatory Visit (HOSPITAL_COMMUNITY): Payer: Medicaid Other | Admitting: Psychiatry

## 2017-06-04 HISTORY — PX: COLONOSCOPY: SHX174

## 2017-06-04 HISTORY — PX: ESOPHAGOGASTRODUODENOSCOPY: SHX1529

## 2017-06-21 ENCOUNTER — Telehealth (HOSPITAL_COMMUNITY): Payer: Self-pay | Admitting: *Deleted

## 2017-06-21 NOTE — Telephone Encounter (Signed)
Patient called stating she has been having leg swelling and was advised by her PCP that she needed to schedule a f/u appt.  Patient has also been experiencing shortness of breath but these events have been related to anxiety attacks.  She was last seen here in 2016.  Per Kevan Rosebush, RN I have scheduled patient to follow up in the APP side.  Appointment scheduled and patient is aware.

## 2017-06-29 ENCOUNTER — Telehealth (HOSPITAL_COMMUNITY): Payer: Self-pay

## 2017-06-29 NOTE — Telephone Encounter (Signed)
[facility] Clinic appointment reminder call placed to patient.  Does understand purpose of this appointment and where CHF Clinic is located? Yes  How is patient feeling? Leg swelling, unsure how much weight is up  Does patient have all of their medications? Yes, from what she knows to take  Patient also reminded to take all medications as prescribed on the day of his/her appointment and to bring all medications to this appointment. Advised to call our office for tardiness or cancellations/rescheduling needs.     (For clinic use)   ___________ Completed appointment   ___________ Gloria Lewis medications as prescribed before coming to appointment   ___________ Brought medications and/or medication list (whichever they go by) to their appointment    Gloria Lewis, [place], BSN, CHFN

## 2017-06-30 ENCOUNTER — Encounter (HOSPITAL_COMMUNITY): Payer: Self-pay

## 2017-06-30 ENCOUNTER — Other Ambulatory Visit (HOSPITAL_COMMUNITY): Payer: Self-pay

## 2017-06-30 ENCOUNTER — Ambulatory Visit (HOSPITAL_COMMUNITY)
Admission: RE | Admit: 2017-06-30 | Discharge: 2017-06-30 | Disposition: A | Discharge status: Home / Self Care | Payer: Medicaid Other | Source: Ambulatory Visit | Attending: Cardiology | Admitting: Cardiology

## 2017-06-30 VITALS — BP 112/87 | HR 82 | Wt 236.0 lb

## 2017-06-30 DIAGNOSIS — F319 Bipolar disorder, unspecified: Secondary | ICD-10-CM | POA: Diagnosis not present

## 2017-06-30 DIAGNOSIS — Z720 Tobacco use: Secondary | ICD-10-CM | POA: Diagnosis not present

## 2017-06-30 DIAGNOSIS — R079 Chest pain, unspecified: Secondary | ICD-10-CM | POA: Insufficient documentation

## 2017-06-30 DIAGNOSIS — Z8249 Family history of ischemic heart disease and other diseases of the circulatory system: Secondary | ICD-10-CM | POA: Diagnosis not present

## 2017-06-30 DIAGNOSIS — R0609 Other forms of dyspnea: Secondary | ICD-10-CM

## 2017-06-30 DIAGNOSIS — F259 Schizoaffective disorder, unspecified: Secondary | ICD-10-CM | POA: Diagnosis not present

## 2017-06-30 DIAGNOSIS — I272 Pulmonary hypertension, unspecified: Secondary | ICD-10-CM | POA: Insufficient documentation

## 2017-06-30 DIAGNOSIS — K219 Gastro-esophageal reflux disease without esophagitis: Secondary | ICD-10-CM | POA: Diagnosis not present

## 2017-06-30 DIAGNOSIS — Z79899 Other long term (current) drug therapy: Secondary | ICD-10-CM | POA: Diagnosis not present

## 2017-06-30 DIAGNOSIS — I208 Other forms of angina pectoris: Secondary | ICD-10-CM

## 2017-06-30 DIAGNOSIS — Z7982 Long term (current) use of aspirin: Secondary | ICD-10-CM | POA: Insufficient documentation

## 2017-06-30 DIAGNOSIS — R0600 Dyspnea, unspecified: Secondary | ICD-10-CM

## 2017-06-30 DIAGNOSIS — I10 Essential (primary) hypertension: Secondary | ICD-10-CM | POA: Insufficient documentation

## 2017-06-30 LAB — CBC
HEMATOCRIT: 43.7 % (ref 36.0–46.0)
HEMOGLOBIN: 14.2 g/dL (ref 12.0–15.0)
MCH: 29.1 pg (ref 26.0–34.0)
MCHC: 32.5 g/dL (ref 30.0–36.0)
MCV: 89.5 fL (ref 78.0–100.0)
Platelets: 393 10*3/uL (ref 150–400)
RBC: 4.88 MIL/uL (ref 3.87–5.11)
RDW: 14.6 % (ref 11.5–15.5)
WBC: 7.1 10*3/uL (ref 4.0–10.5)

## 2017-06-30 LAB — BASIC METABOLIC PANEL
ANION GAP: 10 (ref 5–15)
BUN: 7 mg/dL (ref 6–20)
CALCIUM: 9.9 mg/dL (ref 8.9–10.3)
CO2: 26 mmol/L (ref 22–32)
Chloride: 103 mmol/L (ref 101–111)
Creatinine, Ser: 0.82 mg/dL (ref 0.44–1.00)
GFR calc Af Amer: >60 mL/min (ref >60)
GFR calc non Af Amer: >60 mL/min (ref >60)
GLUCOSE: 99 mg/dL (ref 65–99)
Potassium: 3.8 mmol/L (ref 3.5–5.1)
Sodium: 139 mmol/L (ref 135–145)

## 2017-06-30 LAB — PROTIME-INR
INR: 0.93
PROTHROMBIN TIME: 12.4 s (ref 11.4–15.2)

## 2017-06-30 NOTE — Patient Instructions (Addendum)
You have been scheduled for a heart catheterization. Please see instruction sheet for additional details.  Routine lab work today. Will notify you of abnormal results, otherwise no news is good news!  Will schedule you for an echocardiogram at [facility]. Same check in you did today.  Follow up 6 weeks with Dr. Aundra Dubin.  Take all medication as prescribed the day of your appointment. Bring all medications with you to your appointment.  Do the following things EVERYDAY: 1) Weigh yourself in the morning before breakfast. Write it down and keep it in a log. 2) Take your medicines as prescribed 3) Eat low salt foods-Limit salt (sodium) to 2000 mg per day.  4) Stay as active as you can everyday 5) Limit all fluids for the day to less than 2 liters

## 2017-06-30 NOTE — H&P (View-Only) (Signed)
Patient ID: Gloria Lewis, female   DOB: May 31, 1955, 62 y.o.   MRN: [id number]  PCP: Dr. Harlan Stains Pulmonary: Dr [name]  Psychiatrist : Dr Bobbie Stack  61  yo with history of HTN, smoking, and pulmonary hypertension by echo presents for cardiology evaluation and potential RHC. Patient is a long-time smoker; she is working on quitting.  She was admitted in 3/16 with chest pain that was somewhat positional.  Lexiscan Cardiolite showed no ischemia or infarction.  She had a V/Q scan that actually suggested high risk for PE, but subsequent CTA chest did not show a PE.  There was RML and lingular scarring that may have created the appearance of PE on the V/Q scan.  Echo done later in 5/16 showed preserved LV systolic function but mildly dilated RV with PA systolic pressure 42 mmHg.  She had minimal obstruction or restriction on her PFTs, but DLCO was moderately reduced.   Today she returns for follow. She has not been seen since July 2016. At that time RHC/LHC was recommended however she did not follow up due to multiple family issues. Complaining of increased shortness of breath. SOB with steps and brisk walking. Denies PND/Orthopnea. Denies syncope/presyncope. She has on and off chest pain.  She has not been weighing at home. Continues to smoke. Taking all medications.   PMH: 1. HTN 2. Bipolar disorder 3. OA 4. GERD 5. Schizoaffective disorder 6. Migraines 7. Prior cocaine abuse 8. Active smoker.  PFTs (5/16) with minimal obstruction, minimal restriction, moderately decreased DLCO.   9. Pulmonary hypertension: Echo (5/16) with EF 55-60%, mid-apical inferior hypokinesis, RV mildly dilated with normal systolic function, PA systolic pressure 42 mmHg. PFTs as above.  V/Q scan (4/16) high probability for PE => but CTA chest (4/16) showed no PE, RML and lingular scarring that may have made the V/Q scan appear positive.  10. Lexiscan Cardiolite (3/16) with EF 57%, no ischemia or infarction.   SH: Lives  alone.  Prior cocaine abuse, none now.  Active smoker.    FH: Father with probable sudden cardiac death at age 57  ROS: All systems reviewed and negative except as per HPI.   Current Outpatient Medications  Medication Sig Dispense Refill  . aspirin EC 81 MG EC tablet Take 1 tablet (81 mg total) by mouth daily. 30 tablet 0  . atorvastatin (LIPITOR) 10 MG tablet Take 10 mg by mouth daily.    . Azelastine HCl 0.15 % SOLN Place 1 spray into both nostrils 2 (two) times daily. 30 mL 0  . cholecalciferol (VITAMIN D) 1000 UNITS tablet Take 1,000 Units by mouth daily.    Marland Kitchen dexlansoprazole (DEXILANT) 60 MG capsule Take 1 capsule (60 mg total) by mouth daily. 30 capsule 3  . folic acid (FOLVITE) 1 MG tablet Take 1 mg by mouth daily.    . Levomilnacipran HCl ER 40 MG CP24 2 qam 60 capsule 5  . metoCLOPramide (REGLAN) 10 MG tablet Take 1 tablet (10 mg total) by mouth every 6 (six) hours as needed for nausea (nausea/headache). 6 tablet 0  . Multiple Vitamins-Minerals (MULTIVITAMIN WITH MINERALS) tablet Take 1 tablet by mouth every morning.     . nortriptyline (PAMELOR) 25 MG capsule TAKE 2 CAPSULES BY MOUTH AT BEDTIME 60 capsule 5  . ondansetron (ZOFRAN) 4 MG tablet Take 1 tablet (4 mg total) by mouth every 8 (eight) hours as needed for nausea or vomiting. 20 tablet 0  . potassium chloride SA (K-DUR,KLOR-CON) 20 MEQ tablet Take 20  mEq by mouth 2 (two) times daily.     . QUEtiapine (SEROQUEL) 25 MG tablet Take 1 tablet po bid and 1 prn 90 tablet 3  . valACYclovir (VALTREX) 1000 MG tablet Take 1,000 mg by mouth daily.     . vitamin B-12 (CYANOCOBALAMIN) 1000 MCG tablet Take 1,000 mcg by mouth daily.     No current facility-administered medications for this encounter.    BP (!) 112/7 (BP Location: Left Arm, Patient Position: Sitting, Cuff Size: Large)   Pulse 82   Wt 236 lb (107 kg)   SpO2 98%   BMI 36.96 kg/m   Filed Weights   06/30/17 0905  Weight: 236 lb (107 kg)    General:  Well appearing.  No resp difficulty. Walked slowly back to the clinic. HEENT: normal Neck: supple. no JVD. Carotids 2+ bilat; no bruits. No lymphadenopathy or thryomegaly appreciated. Cor: PMI nondisplaced. Regular rate & rhythm. No rubs, gallops or murmurs. Lungs: clear Abdomen: soft, nontender, nondistended. No hepatosplenomegaly. No bruits or masses. Good bowel sounds. Extremities: no cyanosis, clubbing, rash, edema Neuro: alert & orientedx3, cranial nerves grossly intact. moves all 4 extremities w/o difficulty. Affect pleasant  Assessment/Plan: 1. Chest pain: Iexiscan Cardiolite in 3/16 showed no ischemia or infarction.  Having on and off chest pain. Continues to smoke. Plan LHC/RHC  2. Pulmonary hypertension: Last two echoes have shown elevation of PA pressure and mild RV dilation.  PFTs show neither significant restriction nor obstruction, but DLCO is low.  This could be due to pulmonary vascular disease.  There was concern for PE given V/Q scan, but CTA chest showed no PE and did show some scarring that could have caused a false positive V/Q.  She does not appear volume overloaded.  - - Set up for RHC to evaluate hemodynamics and unexplained dyspnea. Repeat ECHO.   3. ?PE: Question of PE based on V/Q in 4/16 but CTA chest showed no PE and did show scarring that may have caused false positive.   Set up for RHC.  4. Smoking:Counseled to stop smoking however she declines.   Check pre cath labs. Greater than 50% of the (total minutes 25) visit spent in counseling/coordination of care regarding heart catherization, lab tests, and smoking cessation.    Follow up in 6 weeks with Dr Aundra Dubin.   Cordarius Benning NP-C  06/30/2017

## 2017-06-30 NOTE — Progress Notes (Signed)
Patient ID: Gloria Lewis, female   DOB: [birth year], 62 y.o.   MRN: [id number]  PCP: Dr. Harlan Stains Pulmonary: Dr [name]  Psychiatrist : Dr Bobbie Stack  63  yo with history of HTN, smoking, and pulmonary hypertension by echo presents for cardiology evaluation and potential RHC. Patient is a long-time smoker; she is working on quitting.  She was admitted in 3/16 with chest pain that was somewhat positional.  Lexiscan Cardiolite showed no ischemia or infarction.  She had a V/Q scan that actually suggested high risk for PE, but subsequent CTA chest did not show a PE.  There was RML and lingular scarring that may have created the appearance of PE on the V/Q scan.  Echo done later in 5/16 showed preserved LV systolic function but mildly dilated RV with PA systolic pressure 42 mmHg.  She had minimal obstruction or restriction on her PFTs, but DLCO was moderately reduced.   Today she returns for follow. She has not been seen since July 2016. At that time RHC/LHC was recommended however she did not follow up due to multiple family issues. Complaining of increased shortness of breath. SOB with steps and brisk walking. Denies PND/Orthopnea. Denies syncope/presyncope. She has on and off chest pain.  She has not been weighing at home. Continues to smoke. Taking all medications.   PMH: 1. HTN 2. Bipolar disorder 3. OA 4. GERD 5. Schizoaffective disorder 6. Migraines 7. Prior cocaine abuse 8. Active smoker.  PFTs (5/16) with minimal obstruction, minimal restriction, moderately decreased DLCO.   9. Pulmonary hypertension: Echo (5/16) with EF 55-60%, mid-apical inferior hypokinesis, RV mildly dilated with normal systolic function, PA systolic pressure 42 mmHg. PFTs as above.  V/Q scan (4/16) high probability for PE => but CTA chest (4/16) showed no PE, RML and lingular scarring that may have made the V/Q scan appear positive.  10. Lexiscan Cardiolite (3/16) with EF 57%, no ischemia or infarction.   SH: Lives  alone.  Prior cocaine abuse, none now.  Active smoker.    FH: Father with probable sudden cardiac death at age 91  ROS: All systems reviewed and negative except as per HPI.   Current Outpatient Medications  Medication Sig Dispense Refill  . aspirin EC 81 MG EC tablet Take 1 tablet (81 mg total) by mouth daily. 30 tablet 0  . atorvastatin (LIPITOR) 10 MG tablet Take 10 mg by mouth daily.    . Azelastine HCl 0.15 % SOLN Place 1 spray into both nostrils 2 (two) times daily. 30 mL 0  . cholecalciferol (VITAMIN D) 1000 UNITS tablet Take 1,000 Units by mouth daily.    Marland Kitchen dexlansoprazole (DEXILANT) 60 MG capsule Take 1 capsule (60 mg total) by mouth daily. 30 capsule 3  . folic acid (FOLVITE) 1 MG tablet Take 1 mg by mouth daily.    . Levomilnacipran HCl ER 40 MG CP24 2 qam 60 capsule 5  . metoCLOPramide (REGLAN) 10 MG tablet Take 1 tablet (10 mg total) by mouth every 6 (six) hours as needed for nausea (nausea/headache). 6 tablet 0  . Multiple Vitamins-Minerals (MULTIVITAMIN WITH MINERALS) tablet Take 1 tablet by mouth every morning.     . nortriptyline (PAMELOR) 25 MG capsule TAKE 2 CAPSULES BY MOUTH AT BEDTIME 60 capsule 5  . ondansetron (ZOFRAN) 4 MG tablet Take 1 tablet (4 mg total) by mouth every 8 (eight) hours as needed for nausea or vomiting. 20 tablet 0  . potassium chloride SA (K-DUR,KLOR-CON) 20 MEQ tablet Take 20  mEq by mouth 2 (two) times daily.     . QUEtiapine (SEROQUEL) 25 MG tablet Take 1 tablet po bid and 1 prn 90 tablet 3  . valACYclovir (VALTREX) 1000 MG tablet Take 1,000 mg by mouth daily.     . vitamin B-12 (CYANOCOBALAMIN) 1000 MCG tablet Take 1,000 mcg by mouth daily.     No current facility-administered medications for this encounter.    BP (!) 112/7 (BP Location: Left Arm, Patient Position: Sitting, Cuff Size: Large)   Pulse 82   Wt 236 lb (107 kg)   SpO2 98%   BMI 36.96 kg/m   Filed Weights   06/30/17 0905  Weight: 236 lb (107 kg)    General:  Well appearing.  No resp difficulty. Walked slowly back to the clinic. HEENT: normal Neck: supple. no JVD. Carotids 2+ bilat; no bruits. No lymphadenopathy or thryomegaly appreciated. Cor: PMI nondisplaced. Regular rate & rhythm. No rubs, gallops or murmurs. Lungs: clear Abdomen: soft, nontender, nondistended. No hepatosplenomegaly. No bruits or masses. Good bowel sounds. Extremities: no cyanosis, clubbing, rash, edema Neuro: alert & orientedx3, cranial nerves grossly intact. moves all 4 extremities w/o difficulty. Affect pleasant  Assessment/Plan: 1. Chest pain: Iexiscan Cardiolite in 3/16 showed no ischemia or infarction.  Having on and off chest pain. Continues to smoke. Plan LHC/RHC  2. Pulmonary hypertension: Last two echoes have shown elevation of PA pressure and mild RV dilation.  PFTs show neither significant restriction nor obstruction, but DLCO is low.  This could be due to pulmonary vascular disease.  There was concern for PE given V/Q scan, but CTA chest showed no PE and did show some scarring that could have caused a false positive V/Q.  She does not appear volume overloaded.  - - Set up for RHC to evaluate hemodynamics and unexplained dyspnea. Repeat ECHO.   3. ?PE: Question of PE based on V/Q in 4/16 but CTA chest showed no PE and did show scarring that may have caused false positive.   Set up for RHC.  4. Smoking:Counseled to stop smoking however she declines.   Check pre cath labs. Greater than 50% of the (total minutes 25) visit spent in counseling/coordination of care regarding heart catherization, lab tests, and smoking cessation.    Follow up in 6 weeks with Dr Aundra Dubin.   Shawn Carattini NP-C  06/30/2017

## 2017-06-30 NOTE — Progress Notes (Signed)
Cath instructions reviewed

## 2017-07-01 ENCOUNTER — Ambulatory Visit (HOSPITAL_COMMUNITY)
Admission: RE | Admit: 2017-07-01 | Discharge: 2017-07-01 | Disposition: A | Discharge status: Home / Self Care | Payer: Medicaid Other | Source: Ambulatory Visit | Attending: Adult Health | Admitting: Adult Health

## 2017-07-01 DIAGNOSIS — R079 Chest pain, unspecified: Secondary | ICD-10-CM | POA: Diagnosis not present

## 2017-07-01 DIAGNOSIS — F17200 Nicotine dependence, unspecified, uncomplicated: Secondary | ICD-10-CM | POA: Insufficient documentation

## 2017-07-01 DIAGNOSIS — I071 Rheumatic tricuspid insufficiency: Secondary | ICD-10-CM | POA: Diagnosis not present

## 2017-07-01 DIAGNOSIS — I10 Essential (primary) hypertension: Secondary | ICD-10-CM | POA: Diagnosis not present

## 2017-07-01 DIAGNOSIS — I272 Pulmonary hypertension, unspecified: Secondary | ICD-10-CM

## 2017-07-01 NOTE — Progress Notes (Signed)
  Echocardiogram 2D Echocardiogram has been performed.  Merrie Roof F 07/01/2017, 9:05 AM

## 2017-07-02 ENCOUNTER — Other Ambulatory Visit (HOSPITAL_COMMUNITY): Payer: Self-pay

## 2017-07-02 ENCOUNTER — Telehealth (HOSPITAL_COMMUNITY): Payer: Self-pay | Admitting: Cardiology

## 2017-07-02 DIAGNOSIS — F419 Anxiety disorder, unspecified: Secondary | ICD-10-CM

## 2017-07-02 MED ORDER — DIAZEPAM 5 MG PO TABS: 5.0000 mg | ORAL_TABLET | Freq: Once | ORAL | Status: DC

## 2017-07-02 MED ORDER — DIAZEPAM 5 MG PO TABS: 2.5000 mg | ORAL_TABLET | Freq: Two times a day (BID) | ORAL | 0 refills | Status: DC | PRN

## 2017-07-02 NOTE — Telephone Encounter (Signed)
Patient called with concerns of increased anxiety Patient reports as soon as she scheduled procedure (cath 07/05/17) at [place] her anxiety level increased and she will not make it to her procedure.   Reports she has a history of severe anxiety and schizoaffective d/o. Reports she called her PCP and was told to refer back to Dr Aundra Dubin and also called psychiatry and had to leave a message. Her fear with awaiting a response from psychiatry they sometimes wont promptly return call.   Above discussed with Dr. Maceo Pro to order  Valium 2.5 mg one tab BID prn anxiety #6  0 refills  patient to have 5 mg of Valium upon arrival on day of cath (reported off to Katrina, Therapist, sports as I cannot place cath orders)  Patient aware of above and very appreciative rx in front office

## 2017-07-05 ENCOUNTER — Ambulatory Visit (HOSPITAL_COMMUNITY)
Admission: RE | Admit: 2017-07-05 | Discharge: 2017-07-05 | Disposition: A | Discharge status: Home / Self Care | Payer: Medicaid Other | Source: Ambulatory Visit | Attending: Cardiology | Admitting: Cardiology

## 2017-07-05 ENCOUNTER — Encounter (HOSPITAL_COMMUNITY)
Admission: RE | Disposition: A | Discharge status: Home / Self Care | Payer: Self-pay | Source: Ambulatory Visit | Attending: Cardiology

## 2017-07-05 ENCOUNTER — Encounter (HOSPITAL_COMMUNITY): Payer: Self-pay

## 2017-07-05 DIAGNOSIS — F17200 Nicotine dependence, unspecified, uncomplicated: Secondary | ICD-10-CM | POA: Diagnosis not present

## 2017-07-05 DIAGNOSIS — G43909 Migraine, unspecified, not intractable, without status migrainosus: Secondary | ICD-10-CM | POA: Insufficient documentation

## 2017-07-05 DIAGNOSIS — R0600 Dyspnea, unspecified: Secondary | ICD-10-CM

## 2017-07-05 DIAGNOSIS — I10 Essential (primary) hypertension: Secondary | ICD-10-CM | POA: Insufficient documentation

## 2017-07-05 DIAGNOSIS — M1990 Unspecified osteoarthritis, unspecified site: Secondary | ICD-10-CM | POA: Insufficient documentation

## 2017-07-05 DIAGNOSIS — I2510 Atherosclerotic heart disease of native coronary artery without angina pectoris: Secondary | ICD-10-CM | POA: Diagnosis not present

## 2017-07-05 DIAGNOSIS — Z7982 Long term (current) use of aspirin: Secondary | ICD-10-CM | POA: Insufficient documentation

## 2017-07-05 DIAGNOSIS — I272 Pulmonary hypertension, unspecified: Secondary | ICD-10-CM | POA: Insufficient documentation

## 2017-07-05 DIAGNOSIS — K219 Gastro-esophageal reflux disease without esophagitis: Secondary | ICD-10-CM | POA: Diagnosis not present

## 2017-07-05 DIAGNOSIS — R079 Chest pain, unspecified: Secondary | ICD-10-CM | POA: Insufficient documentation

## 2017-07-05 DIAGNOSIS — F259 Schizoaffective disorder, unspecified: Secondary | ICD-10-CM | POA: Diagnosis not present

## 2017-07-05 DIAGNOSIS — Z79899 Other long term (current) drug therapy: Secondary | ICD-10-CM | POA: Diagnosis not present

## 2017-07-05 DIAGNOSIS — F319 Bipolar disorder, unspecified: Secondary | ICD-10-CM | POA: Insufficient documentation

## 2017-07-05 HISTORY — PX: RIGHT/LEFT HEART CATH AND CORONARY ANGIOGRAPHY: CATH118266

## 2017-07-05 LAB — POCT I-STAT 3, VENOUS BLOOD GAS (G3P V)
Acid-Base Excess: 1 mmol/L (ref 0.0–2.0)
Acid-Base Excess: 3 mmol/L — ABNORMAL HIGH (ref 0.0–2.0)
Bicarbonate: 26.9 mmol/L (ref 20.0–28.0)
Bicarbonate: 28.8 mmol/L — ABNORMAL HIGH (ref 20.0–28.0)
O2 Saturation: 69 %
O2 Saturation: 71 %
TCO2: 28 mmol/L (ref 22–32)
TCO2: 30 mmol/L (ref 22–32)
pCO2, Ven: 46.1 mmHg (ref 44.0–60.0)
pCO2, Ven: 47.1 mmHg (ref 44.0–60.0)
pH, Ven: 7.373 (ref 7.250–7.430)
pH, Ven: 7.394 (ref 7.250–7.430)
pO2, Ven: 37 mmHg (ref 32.0–45.0)
pO2, Ven: 38 mmHg (ref 32.0–45.0)

## 2017-07-05 LAB — POCT ACTIVATED CLOTTING TIME: Activated Clotting Time: 186 s

## 2017-07-05 SURGERY — RIGHT/LEFT HEART CATH AND CORONARY ANGIOGRAPHY
Anesthesia: LOCAL

## 2017-07-05 MED ORDER — LIDOCAINE HCL 1 % IJ SOLN
INTRAMUSCULAR | Status: AC
  Filled 2017-07-05: qty 20

## 2017-07-05 MED ORDER — SODIUM CHLORIDE 0.9 % IV SOLN: 250.0000 mL | INTRAVENOUS | Status: DC | PRN

## 2017-07-05 MED ORDER — MIDAZOLAM HCL 2 MG/2ML IJ SOLN
INTRAMUSCULAR | Status: DC | PRN
  Administered 2017-07-05 (×2): 1 mg via INTRAVENOUS

## 2017-07-05 MED ORDER — DIPHENHYDRAMINE HCL 25 MG PO CAPS
25.0000 mg | ORAL_CAPSULE | Freq: Once | ORAL | Status: AC
  Administered 2017-07-05: 25 mg via ORAL
  Filled 2017-07-05: qty 1

## 2017-07-05 MED ORDER — FENTANYL CITRATE (PF) 100 MCG/2ML IJ SOLN
INTRAMUSCULAR | Status: DC | PRN
  Administered 2017-07-05 (×3): 25 ug via INTRAVENOUS

## 2017-07-05 MED ORDER — HEPARIN SODIUM (PORCINE) 1000 UNIT/ML IJ SOLN
INTRAMUSCULAR | Status: DC | PRN
  Administered 2017-07-05: 5000 [IU] via INTRAVENOUS

## 2017-07-05 MED ORDER — HEPARIN SODIUM (PORCINE) 1000 UNIT/ML IJ SOLN
INTRAMUSCULAR | Status: AC
  Filled 2017-07-05: qty 1

## 2017-07-05 MED ORDER — MIDAZOLAM HCL 2 MG/2ML IJ SOLN
INTRAMUSCULAR | Status: AC
  Filled 2017-07-05: qty 2

## 2017-07-05 MED ORDER — SODIUM CHLORIDE 0.9% FLUSH: 3.0000 mL | Freq: Two times a day (BID) | INTRAVENOUS | Status: DC

## 2017-07-05 MED ORDER — HEPARIN (PORCINE) IN NACL 2-0.9 UNIT/ML-% IJ SOLN
INTRAMUSCULAR | Status: AC
  Filled 2017-07-05: qty 1000

## 2017-07-05 MED ORDER — SODIUM CHLORIDE 0.9 % IV SOLN: INTRAVENOUS | Status: DC

## 2017-07-05 MED ORDER — FENTANYL CITRATE (PF) 100 MCG/2ML IJ SOLN
INTRAMUSCULAR | Status: AC
  Filled 2017-07-05: qty 2

## 2017-07-05 MED ORDER — ONDANSETRON HCL 4 MG/2ML IJ SOLN: 4.0000 mg | Freq: Four times a day (QID) | INTRAMUSCULAR | Status: DC | PRN

## 2017-07-05 MED ORDER — NITROGLYCERIN 1 MG/10 ML FOR IR/CATH LAB
INTRA_ARTERIAL | Status: AC
  Filled 2017-07-05: qty 10

## 2017-07-05 MED ORDER — NITROGLYCERIN 1 MG/10 ML FOR IR/CATH LAB
INTRA_ARTERIAL | Status: DC | PRN
  Administered 2017-07-05: 200 ug via INTRA_ARTERIAL

## 2017-07-05 MED ORDER — IOHEXOL 350 MG/ML SOLN
INTRAVENOUS | Status: DC | PRN
  Administered 2017-07-05: 55 mL via INTRA_ARTERIAL

## 2017-07-05 MED ORDER — ASPIRIN 81 MG PO CHEW
CHEWABLE_TABLET | ORAL | Status: AC
  Filled 2017-07-05: qty 1

## 2017-07-05 MED ORDER — LIDOCAINE HCL (PF) 1 % IJ SOLN
INTRAMUSCULAR | Status: DC | PRN
  Administered 2017-07-05: 10 mL
  Administered 2017-07-05: 2 mL

## 2017-07-05 MED ORDER — ASPIRIN 81 MG PO CHEW
81.0000 mg | CHEWABLE_TABLET | ORAL | Status: AC
  Administered 2017-07-05: 81 mg via ORAL

## 2017-07-05 MED ORDER — HEPARIN (PORCINE) IN NACL 2-0.9 UNIT/ML-% IJ SOLN
INTRAMUSCULAR | Status: AC | PRN
  Administered 2017-07-05 (×2): 500 mL

## 2017-07-05 MED ORDER — VERAPAMIL HCL 2.5 MG/ML IV SOLN
INTRAVENOUS | Status: AC
  Filled 2017-07-05: qty 2

## 2017-07-05 MED ORDER — ACETAMINOPHEN 325 MG PO TABS: 650.0000 mg | ORAL_TABLET | ORAL | Status: DC | PRN

## 2017-07-05 MED ORDER — OXYCODONE-ACETAMINOPHEN 5-325 MG PO TABS
ORAL_TABLET | ORAL | Status: AC
  Filled 2017-07-05: qty 2

## 2017-07-05 MED ORDER — OXYCODONE-ACETAMINOPHEN 5-325 MG PO TABS
2.0000 | ORAL_TABLET | Freq: Once | ORAL | Status: AC
  Administered 2017-07-05: 2 via ORAL

## 2017-07-05 MED ORDER — VERAPAMIL HCL 2.5 MG/ML IV SOLN
INTRAVENOUS | Status: DC | PRN
  Administered 2017-07-05: 10 mL via INTRA_ARTERIAL

## 2017-07-05 MED ORDER — SODIUM CHLORIDE 0.9% FLUSH: 3.0000 mL | INTRAVENOUS | Status: DC | PRN

## 2017-07-05 SURGICAL SUPPLY — 14 items
BAND CMPR LRG ZPHR (HEMOSTASIS) ×1
BAND ZEPHYR COMPRESS 30 LONG (HEMOSTASIS) ×1 IMPLANT
CATH IMPULSE 5F ANG/FL3.5 (CATHETERS) ×1 IMPLANT
CATH SWAN GANZ 7F STRAIGHT (CATHETERS) ×1 IMPLANT
GUIDEWIRE INQWIRE 1.5J.035X260 (WIRE) ×1 IMPLANT
INQWIRE 1.5J .035X260CM (WIRE) ×2
KIT HEART LEFT (KITS) ×2 IMPLANT
NEEDLE PERC 21GX4CM (NEEDLE) ×2 IMPLANT
PACK CARDIAC CATHETERIZATION (CUSTOM PROCEDURE TRAY) ×2 IMPLANT
SHEATH AVANTI 11CM 7FR (SHEATH) ×1 IMPLANT
SHEATH RAIN RADIAL 21G 6FR (SHEATH) ×1 IMPLANT
TRANSDUCER W/STOPCOCK (MISCELLANEOUS) ×2 IMPLANT
TUBING CIL FLEX 10 FLL-RA (TUBING) ×2 IMPLANT
WIRE HI TORQ VERSACORE-J 145CM (WIRE) ×1 IMPLANT

## 2017-07-05 NOTE — Progress Notes (Addendum)
Site area: RFV  Site Prior to Removal:  Level 0 Pressure Applied For:10 min Manual:   yes Patient Status During Pull:  stable Post Pull Site:  Level 0 Post Pull Instructions Given:  yes Post Pull Pulses Present: palpable Dressing Applied:  tegaderm Bedrest begins @ 1657 till 1515 Comments:

## 2017-07-05 NOTE — Progress Notes (Signed)
Client c/o cough and requested benadryl and Dr Aundra Dubin notified and order noted and med given

## 2017-07-05 NOTE — Discharge Instructions (Signed)
Radial Site Care Refer to this sheet in the next few weeks. These instructions provide you with information about caring for yourself after your procedure. Your health care provider may also give you more specific instructions. Your treatment has been planned according to current medical practices, but problems sometimes occur. Call your health care provider if you have any problems or questions after your procedure. What can I expect after the procedure? After your procedure, it is typical to have the following:  Bruising at the radial site that usually fades within 1-2 weeks.  Blood collecting in the tissue (hematoma) that may be painful to the touch. It should usually decrease in size and tenderness within 1-2 weeks.  Follow these instructions at home:  Take medicines only as directed by your health care provider.  You may shower 24-48 hours after the procedure or as directed by your health care provider. Remove the bandage (dressing) and gently wash the site with plain soap and water. Pat the area dry with a clean towel. Do not rub the site, because this may cause bleeding.  Do not take baths, swim, or use a hot tub until your health care provider approves.  Check your insertion site every day for redness, swelling, or drainage.  Do not apply powder or lotion to the site.  Do not flex or bend the affected arm for 24 hours or as directed by your health care provider.  Do not push or pull heavy objects with the affected arm for 24 hours or as directed by your health care provider.  Do not lift over 10 lb (4.5 kg) for 5 days after your procedure or as directed by your health care provider.  Ask your health care provider when it is okay to: ? Return to work or school. ? Resume usual physical activities or sports. ? Resume sexual activity.  Do not drive home if you are discharged the same day as the procedure. Have someone else drive you.  You may drive 24 hours after the procedure  unless otherwise instructed by your health care provider.  Do not operate machinery or power tools for 24 hours after the procedure.  If your procedure was done as an outpatient procedure, which means that you went home the same day as your procedure, a responsible adult should be with you for the first 24 hours after you arrive home.  Keep all follow-up visits as directed by your health care provider. This is important. Contact a health care provider if:  You have a fever.  You have chills.  You have increased bleeding from the radial site. Hold pressure on the site. Get help right away if:  You have unusual pain at the radial site.  You have redness, warmth, or swelling at the radial site.  You have drainage (other than a small amount of blood on the dressing) from the radial site.  The radial site is bleeding, and the bleeding does not stop after 30 minutes of holding steady pressure on the site.  Your arm or hand becomes pale, cool, tingly, or numb. This information is not intended to replace advice given to you by your health care provider. Make sure you discuss any questions you have with your health care provider. Document Released: 04/25/2010 Document Revised: 08/29/2015 Document Reviewed: 10/09/2013 Elsevier Interactive Patient Education  2018 [employer] Refer to this sheet in the next few weeks. These instructions provide you with information about caring for yourself after your procedure.  Your health care provider may also give you more specific instructions. Your treatment has been planned according to current medical practices, but problems sometimes occur. Call your health care provider if you have any problems or questions after your procedure. What can I expect after the procedure? After your procedure, it is typical to have the following:  Bruising at the site that usually fades within 1-2 weeks.  Blood collecting in the tissue (hematoma) that  may be painful to the touch. It should usually decrease in size and tenderness within 1-2 weeks.  Follow these instructions at home:  Take medicines only as directed by your health care provider.  You may shower 24-48 hours after the procedure or as directed by your health care provider. Remove the bandage (dressing) and gently wash the site with plain soap and water. Pat the area dry with a clean towel. Do not rub the site, because this may cause bleeding.  Do not take baths, swim, or use a hot tub until your health care provider approves.  Check your insertion site every day for redness, swelling, or drainage.  Do not apply powder or lotion to the site.  Limit use of stairs to twice a day for the first 2-3 days or as directed by your health care provider.  Do not squat for the first 2-3 days or as directed by your health care provider.  Do not lift over 10 lb (4.5 kg) for 5 days after your procedure or as directed by your health care provider.  Ask your health care provider when it is okay to: ? Return to work or school. ? Resume usual physical activities or sports. ? Resume sexual activity.  Do not drive home if you are discharged the same day as the procedure. Have someone else drive you.  You may drive 24 hours after the procedure unless otherwise instructed by your health care provider.  Do not operate machinery or power tools for 24 hours after the procedure or as directed by your health care provider.  If your procedure was done as an outpatient procedure, which means that you went home the same day as your procedure, a responsible adult should be with you for the first 24 hours after you arrive home.  Keep all follow-up visits as directed by your health care provider. This is important. Contact a health care provider if:  You have a fever.  You have chills.  You have increased bleeding from the site. Hold pressure on the site. Get help right away if:  You have  unusual pain at the site.  You have redness, warmth, or swelling at the site.  You have drainage (other than a small amount of blood on the dressing) from the site.  The site is bleeding, and the bleeding does not stop after 30 minutes of holding steady pressure on the site.  Your leg or foot becomes pale, cool, tingly, or numb. This information is not intended to replace advice given to you by your health care provider. Make sure you discuss any questions you have with your health care provider. Document Released: 11/24/2013 Document Revised: 08/29/2015 Document Reviewed: 10/10/2013 Elsevier Interactive Patient Education  [employer].

## 2017-07-05 NOTE — Interval H&P Note (Signed)
History and Physical Interval Note:  07/05/2017 11:52 AM  Gloria Lewis  has presented today for surgery, with the diagnosis of hf  The various methods of treatment have been discussed with the patient and family. After consideration of risks, benefits and other options for treatment, the patient has consented to  Procedure(s): RIGHT/LEFT HEART CATH AND CORONARY ANGIOGRAPHY (N/A) as a surgical intervention .  The patient's history has been reviewed, patient examined, no change in status, stable for surgery.  I have reviewed the patient's chart and labs.  Questions were answered to the patient's satisfaction.     [place] [employer]

## 2017-07-06 ENCOUNTER — Encounter (HOSPITAL_COMMUNITY): Payer: Self-pay | Admitting: Cardiology

## 2017-07-06 MED FILL — Heparin Sodium (Porcine) 2 Unit/ML in Sodium Chloride 0.9%: INTRAMUSCULAR | Qty: 1000 | Status: AC

## 2017-07-06 MED FILL — Lidocaine HCl Local Inj 1%: INTRAMUSCULAR | Qty: 20 | Status: AC

## 2017-07-22 ENCOUNTER — Other Ambulatory Visit (HOSPITAL_COMMUNITY): Payer: Self-pay | Admitting: Psychiatry

## 2017-07-23 ENCOUNTER — Other Ambulatory Visit: Payer: Self-pay

## 2017-07-23 ENCOUNTER — Ambulatory Visit (INDEPENDENT_AMBULATORY_CARE_PROVIDER_SITE_OTHER): Payer: Medicaid Other | Admitting: Psychiatry

## 2017-07-23 VITALS — BP 132/88 | HR 103 | Resp 16 | Wt 232.0 lb

## 2017-07-23 DIAGNOSIS — F339 Major depressive disorder, recurrent, unspecified: Secondary | ICD-10-CM | POA: Diagnosis not present

## 2017-07-23 DIAGNOSIS — F17210 Nicotine dependence, cigarettes, uncomplicated: Secondary | ICD-10-CM | POA: Diagnosis not present

## 2017-07-23 DIAGNOSIS — Z818 Family history of other mental and behavioral disorders: Secondary | ICD-10-CM

## 2017-07-23 DIAGNOSIS — Z634 Disappearance and death of family member: Secondary | ICD-10-CM

## 2017-07-23 DIAGNOSIS — Z5689 Other problems related to employment: Secondary | ICD-10-CM

## 2017-07-23 MED ORDER — ZOLPIDEM TARTRATE ER 12.5 MG PO TBCR: 12.5000 mg | EXTENDED_RELEASE_TABLET | Freq: Every evening | ORAL | 1 refills | Status: DC | PRN

## 2017-07-23 MED ORDER — DIAZEPAM 5 MG PO TABS: ORAL_TABLET | ORAL | 1 refills | Status: DC

## 2017-07-23 MED ORDER — QUETIAPINE FUMARATE 25 MG PO TABS: 25.0000 mg | ORAL_TABLET | Freq: Two times a day (BID) | ORAL | 5 refills | Status: DC

## 2017-07-23 MED ORDER — NORTRIPTYLINE HCL 25 MG PO CAPS
50.0000 mg | ORAL_CAPSULE | Freq: Every day | ORAL | 5 refills | Status: DC
Start: 2017-07-23 — End: 2017-10-27

## 2017-07-23 NOTE — Progress Notes (Signed)
Patient ID: Gloria Lewis, female   DOB: 15-Oct-1955, 62 y.o.   MRN: [id number] [facility] MD Progress Note  07/23/2017 9:31 AM Gloria Lewis  MRN:  [id number] Subjective:  Shoulder hurting Principal Problem: Major Depression,recurent Mild Diagnosis: Major Depression, Recurent  A lot has happened. Due to scheduling problems at this setting she ended up not coming back in 2 months. Is now 4 months. Over the 4 months she's had 2 jobs. She actually still has the job that she is residing this week. She's also had a cardiac catheterization which was fortunately negative. This patient had a death ofchild about a year ago most likely from an overdose.She has 2 sons right now and one of which is chronically mentally ill. The patient is very outgoing and proactive. She denies daily depression. She somewhat frustrated by the work setting but she plans to get back to work setting even if it means creating her own group. She enjoys group homes a great deal. The patient is a step-Up program in [place] just recently.the patient is very interactive. Patient has no evidence of psychosis. 20 years ago she did have a significant substance abuse problems been clean since I've known her she says her over 20 years. Over the last year to we've slowlycome down her Valium at one point we even seem to stop. Right now the patient is clearly a transition and is very anxious. We've had problems with her own system about getting authorizations for her in the that she's always taking but has been out of. The patient is doing great with her weight. She is on a special diet now has lost weight. In general she's got good. She denies the use of alcohol or drugs. The patient recently got a puppy. The patient also is on Percocet from Dr. Nancy Fetter at [facility] pain center. She is followed closely by her medical doctor. Presently the patient is not in a relationship. This patient is not suicidal nor she homicidal. She's well-dressed friendly and  engaging. Patient Active Problem List   Diagnosis Date Noted  . Infectious gastroenteritis [A09] 04/16/2016  . Surgery, elective [Z41.9] 05/23/2015  . S/P arthroscopy of shoulder [Z98.890] 05/23/2015  . Chronic migraine without aura without status migrainosus, not intractable [G43.709] 10/18/2014  . Tobacco abuse [Z72.0] 10/18/2014  . Obesity [E66.9] 09/23/2014  . COPD [J44.9] 09/02/2014  . Pulmonary hypertension ([place]) [I27.20] 08/31/2014  . Respiratory failure with hypoxia ([employer]) [J96.91] 08/14/2014  . Cigarette smoker [F17.210] 07/28/2014  . Major depressive disorder, recurrent episode, moderate ([place]) [F33.1] 07/06/2014  . Essential hypertension [I10]   . SOB (shortness of breath) [R06.02] 06/21/2014  . Precordial pain [R07.2] 06/21/2014  . Gastroesophageal reflux disease [K21.9] 06/21/2014  . Chest pain [R07.9] 06/21/2014  . HTN (hypertension) [I10]   . Neck pain [M54.2] 01/08/2014  . Major depressive disorder, recurrent episode, severe, without mention of psychotic behavior [F33.2] 10/14/2012  . Schizoaffective disorder ([place]) [F25.9] 05/26/2012  . 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 Hyperparathyroidism, primary ([place]) [E21.0] 10/06/2010  . DEGENERATIVE DISC DISEASE, LUMBOSACRAL SPINE [M51.37] 05/21/2007  . DERMATOPHYTOSIS OF THE BODY [B35.4] 05/10/2007  . 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 Anxiety state [F41.1] 03/24/2007  . DENTAL PAIN [K08.9] 03/24/2007  . SHOULDER PAIN, LEFT [M25.519] 03/24/2007   Total Time spent with patient:30 min  Past Psychiatric History:   Past Medical History:  Past Medical History:  Diagnosis Date  . Anginal pain ([place])    admit 06/2014; had non-ischemic stress test  .  Anxiety   . Bipolar 1 disorder ([place])   . Colon polyp   . CTS (carpal tunnel syndrome)   . Depression   . Fever blister   . GERD (gastroesophageal reflux disease)   . HA (headache)   . HTN (hypertension)   . Hypercholesterolemia   . Migraines   . OA  (osteoarthritis)   . Schizo-affective psychosis ([place])     Past Surgical History:  Procedure Laterality Date  . ANTERIOR CERVICAL DECOMP/DISCECTOMY FUSION  08/27/2011   Procedure: ANTERIOR CERVICAL DECOMPRESSION/DISCECTOMY FUSION 1 LEVEL/HARDWARE REMOVAL;  Surgeon: Eustace Moore, MD;  Location: [place] [facility];  Service: Neurosurgery;  Laterality: Bilateral;  Cervical four-five Anterior cervical decompression/diskectomy, fusion, Plate, Removal of Cervical five-seven Plate  . back injection    . CARDIAC CATHETERIZATION N/A 11/09/2014   Procedure: Right Heart Cath;  Surgeon: Larey Dresser, MD;  Location: [place] CV LAB;  Service: Cardiovascular;  Laterality: N/A;  . CARPAL TUNNEL RELEASE  20110 rt/lt   rt x2 , lt x1  . HEMORRHOID SURGERY    . MULTIPLE TOOTH EXTRACTIONS    . NECK SURGERY  2009  . PITUITARY SURGERY     Had gland removed from producing too much calcium  . polp removed  2011  . RIGHT/LEFT HEART CATH AND CORONARY ANGIOGRAPHY N/A 07/05/2017   Procedure: RIGHT/LEFT HEART CATH AND CORONARY ANGIOGRAPHY;  Surgeon: Larey Dresser, MD;  Location: [place] [facility] LAB;  Service: Cardiovascular;  Laterality: N/A;  . SHOULDER ARTHROSCOPY WITH ROTATOR CUFF REPAIR Right 05/23/2015   Procedure: RIGHT SHOULDER ARTHROSCOPY WITH REMOVAL OF SUTURE ANCHOR AND POSSIBLE REVISION ROTATOR CUFF REPAIR;  Surgeon: Justice Britain, MD;  Location: [place];  Service: Orthopedics;  Laterality: Right;  . SHOULDER ARTHROSCOPY WITH SUBACROMIAL DECOMPRESSION Right 01/24/2015   Procedure: RIGHT SHOULDER ARTHROSCOPY WITH SUBACROMIAL DECOMPRESSION AD DISTAL CLAVICLE RESECTION ;  Surgeon: Justice Britain, MD;  Location: [place];  Service: Orthopedics;  Laterality: Right;  Marland Kitchen VAGINAL DELIVERY     x3   Family History:  Family History  Problem Relation Age of Onset  . Coronary artery disease Father   . Cancer Father        head neck   . Hypertension Mother   . Schizophrenia Mother   . Depression Brother   . Prostate cancer  Brother   . Anesthesia problems Neg Hx   . Hypotension Neg Hx   . Malignant hyperthermia Neg Hx   . Pseudochol deficiency Neg Hx   . Allergic rhinitis Neg Hx   . Angioedema Neg Hx   . Asthma Neg Hx   . Atopy Neg Hx   . Eczema Neg Hx   . Immunodeficiency Neg Hx   . Urticaria Neg Hx    Family Psychiatric  History:  Social History:  Social History   Substance and Sexual Activity  Alcohol Use No  . Alcohol/week: 0.0 oz     Social History   Substance and Sexual Activity  Drug Use No    Social History   Socioeconomic History  . Marital status: Single    Spouse name: Not on file  . Number of children: 3  . Years of education: Not on file  . Highest education level: Some college, no degree  Occupational History  . Occupation: disabled/retired  Social Needs  . Financial resource strain: Somewhat hard  . Food insecurity:    Worry: Sometimes true    Inability: Sometimes true  . Transportation needs:    Medical: Yes  Non-medical: Yes  Tobacco Use  . Smoking status: Current Some Day Smoker    Packs/day: 0.10    Years: 30.00    Pack years: 3.00    Types: Cigarettes  . Smokeless tobacco: Never Used  . Tobacco comment: using nicotrol inhaler  Substance and Sexual Activity  . Alcohol use: No    Alcohol/week: 0.0 oz  . Drug use: No  . Sexual activity: Never  Lifestyle  . Physical activity:    Days per week: 0 days    Minutes per session: 0 min  . Stress: Rather much  Relationships  . Social connections:    Talks on phone: More than three times a week    Gets together: More than three times a week    Attends religious service: More than 4 times per year    Active member of club or organization: Yes    Attends meetings of clubs or organizations: More than 4 times per year    Relationship status: Never married  Other Topics Concern  . Not on file  Social History Narrative  . Not on file   Additional Social History:                         Sleep:  Good  Appetite:  Fair  Current Medications: Current Outpatient Medications  Medication Sig Dispense Refill  . aspirin EC 81 MG EC tablet Take 1 tablet (81 mg total) by mouth daily. 30 tablet 0  . atorvastatin (LIPITOR) 10 MG tablet Take 10 mg by mouth daily.    . cholecalciferol (VITAMIN D) 1000 UNITS tablet Take 1,000 Units by mouth daily.    Marland Kitchen dexlansoprazole (DEXILANT) 60 MG capsule Take 1 capsule (60 mg total) by mouth daily. 30 capsule 3  . diphenhydrAMINE (BENADRYL) 25 mg capsule Take 25 mg by mouth daily as needed for allergies.    . fluticasone (FLONASE) 50 MCG/ACT nasal spray Place 1 spray into both nostrils daily.    . metoCLOPramide (REGLAN) 10 MG tablet Take 1 tablet (10 mg total) by mouth every 6 (six) hours as needed for nausea (nausea/headache). 6 tablet 0  . Multiple Vitamins-Minerals (MULTIVITAMIN WITH MINERALS) tablet Take 1 tablet by mouth every morning.     . nortriptyline (PAMELOR) 25 MG capsule Take 2 capsules (50 mg total) by mouth at bedtime. 60 capsule 5  . oxyCODONE-acetaminophen (PERCOCET/ROXICET) 5-325 MG tablet Take 1 tablet by mouth every 8 (eight) hours as needed for severe pain.    . potassium chloride SA (K-DUR,KLOR-CON) 20 MEQ tablet Take 20 mEq by mouth 2 (two) times daily.     . QUEtiapine (SEROQUEL) 25 MG tablet Take 1 tablet (25 mg total) by mouth 2 (two) times daily. Take 1 tablet po bid and 1 prn 30 tablet 5  . triamterene-hydrochlorothiazide (DYAZIDE) 50-25 MG capsule Take 1 capsule by mouth daily.    . valACYclovir (VALTREX) 1000 MG tablet Take 1,000 mg by mouth daily.     . vitamin B-12 (CYANOCOBALAMIN) 1000 MCG tablet Take 1,000 mcg by mouth daily.    . budesonide-formoterol (SYMBICORT) 160-4.5 MCG/ACT inhaler Inhale 2 puffs into the lungs 2 (two) times daily.    . diazepam (VALIUM) 5 MG tablet 1  bid 60 tablet 1  . folic acid (FOLVITE) 1 MG tablet Take 1 mg by mouth daily.    Marland Kitchen linaclotide (LINZESS) 145 MCG CAPS capsule Take 145 mcg by mouth daily  as needed.    . zolpidem (  AMBIEN CR) 12.5 MG CR tablet Take 1 tablet (12.5 mg total) by mouth at bedtime as needed for sleep. 30 tablet 1   No current facility-administered medications for this visit.     Lab Results: No results found for this or any previous visit (from the past 48 hour(s)).  Physical Findings: AIMS:  , ,  ,  ,    CIWA:    COWS:     Musculoskeletal: Strength & Muscle Tone: within normal limits Gait & Station: normal Patient leans: N/A  Psychiatric Specialty Exam: ROS  Blood pressure 132/88, pulse (!) 103, resp. rate 16, weight 232 lb (105.2 kg), SpO2 97 %.Body mass index is 35.8 kg/m.  General Appearance: Casual  Eye Contact::  Good  Speech:  Clear and Coherent  Volume:  Normal  Mood:  Euthymic  Affect:  Congruent  Thought Process:  Coherent  Orientation:  Full (Time, Place, and Person)  Thought Content:  WDL  Suicidal Thoughts:  No  Homicidal Thoughts:  No  Memory:  NA  Judgement:  Good  Insight:  Fair  Psychomotor Activity:  Normal  Concentration:  Fair  Recall:  Good  Fund of Knowledge:Good  Language: Good  Akathisia:  No  Handed:  Right  AIMS (if indicated):     Assets:   ADL's:  Intact  Cognition: WNL  Sleep:       Treatment Plan  07/23/2017, 9:31 AM  At this time we talked about the importance of being sober clean of drugs and being consistent. Unfortunately we have not been consistent with her and assisted has not been consistent. At this time we will restart her relationship. The patient will return to her Valium 5 mg twice a day. This is lower than her normal previous dose of 10 mg twice a day. I plan to use this only for a temporary period of time hopefully no more than 3 months. The patient will receive a prescription for Ambien we'll attempt to once again try to get it off. She'll continue taking her nortriptyline as prescribed. She'll continue taking 25 mg of Seroquel she's always take it. The patient is now going to actively look for  new job or perhaps even try to develop a group home. The patient has a lot of certifications and is very proactivefor staying in the community in getting back to work place. This patient she'll return to see me in 2 weeks we'll reevaluate the above plan.it should be noted that this patient had lost her therapist because the therapist retired. The patient on her own went to hospice and presently has a grieving therapy. The patient also has recently called bya therapist and is going to start in therapy in the next week or 2. Says she's done everything to stay in care stay and treatment and to engage in psychotherapy. We'll do everything we can to support her.

## 2017-08-04 ENCOUNTER — Encounter: Payer: Self-pay | Admitting: Podiatry

## 2017-08-06 ENCOUNTER — Encounter (HOSPITAL_COMMUNITY): Payer: Self-pay | Admitting: Psychiatry

## 2017-08-06 ENCOUNTER — Ambulatory Visit (INDEPENDENT_AMBULATORY_CARE_PROVIDER_SITE_OTHER): Payer: Medicaid Other | Admitting: Psychiatry

## 2017-08-06 VITALS — BP 122/76 | HR 76 | Ht 67.0 in | Wt 229.0 lb

## 2017-08-06 DIAGNOSIS — Z818 Family history of other mental and behavioral disorders: Secondary | ICD-10-CM | POA: Diagnosis not present

## 2017-08-06 DIAGNOSIS — F324 Major depressive disorder, single episode, in partial remission: Secondary | ICD-10-CM

## 2017-08-06 DIAGNOSIS — F17210 Nicotine dependence, cigarettes, uncomplicated: Secondary | ICD-10-CM | POA: Diagnosis not present

## 2017-08-06 MED ORDER — DIAZEPAM 5 MG PO TABS: ORAL_TABLET | ORAL | 1 refills | Status: DC

## 2017-08-06 MED ORDER — DIAZEPAM 5 MG PO TABS
ORAL_TABLET | ORAL | 1 refills | Status: DC
Start: 2017-08-06 — End: 2017-10-15

## 2017-08-06 NOTE — Progress Notes (Signed)
Patient ID: VESPER TRANT, female   DOB: 30-Mar-1956, 62 y.o.   MRN: [id number] [facility] MD Progress Note  08/06/2017 10:17 AM Gloria Lewis  MRN:  [id number] Subjective:  Shoulder hurting Principal Problem: Major Depression,recurent Mild Diagnosis: Major Depression, Recurent  Today the patient is seen again. She was seen 2 weeks ago. At that time things are very chaotic. She dropped out of her care. At this time she is back to taking her Valium as prescribed 5 mg twice a day. She continues on nortriptyline and Seroquel. The patient now is back in therapy but she's with a younger inexperienced therapist as follows the patient to some degree.The patient is yet to find employment. Her true biggest stresses are her 2 sons. One of her sinus chronically mentally ill the other son is very attentive to her. Patient's mood is good. She denies the use of drugs or alcohol. She still waiting to get her Ambien as it has been tied up with insurance issues. The patient is not suicidal. Patient Active Problem List   Diagnosis Date Noted  . Infectious gastroenteritis [A09] 04/16/2016  . Surgery, elective [Z41.9] 05/23/2015  . S/P arthroscopy of shoulder [Z98.890] 05/23/2015  . Chronic migraine without aura without status migrainosus, not intractable [G43.709] 10/18/2014  . Tobacco abuse [Z72.0] 10/18/2014  . Obesity [E66.9] 09/23/2014  . COPD [J44.9] 09/02/2014  . Pulmonary hypertension ([place]) [I27.20] 08/31/2014  . Respiratory failure with hypoxia ([place]) [J96.91] 08/14/2014  . Cigarette smoker [F17.210] 07/28/2014  . Major depressive disorder, recurrent episode, moderate ([place]) [F33.1] 07/06/2014  . Essential hypertension [I10]   . SOB (shortness of breath) [R06.02] 06/21/2014  . Precordial pain [R07.2] 06/21/2014  . Gastroesophageal reflux disease [K21.9] 06/21/2014  . Chest pain [R07.9] 06/21/2014  . HTN (hypertension) [I10]   . Neck pain [M54.2] 01/08/2014  . Major depressive disorder, recurrent episode,  severe, without mention of psychotic behavior [F33.2] 10/14/2012  . Schizoaffective disorder ([place]) [F25.9] 05/26/2012  . 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 Hyperparathyroidism, primary ([place]) [E21.0] 10/06/2010  . DEGENERATIVE DISC DISEASE, LUMBOSACRAL SPINE [M51.37] 05/21/2007  . DERMATOPHYTOSIS OF THE BODY [B35.4] 05/10/2007  . 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 Anxiety state [F41.1] 03/24/2007  . DENTAL PAIN [K08.9] 03/24/2007  . SHOULDER PAIN, LEFT [M25.519] 03/24/2007   Total Time spent with patient:30 min  Past Psychiatric History:   Past Medical History:  Past Medical History:  Diagnosis Date  . Anginal pain ([place])    admit 06/2014; had non-ischemic stress test  . Anxiety   . Bipolar 1 disorder ([place])   . Colon polyp   . CTS (carpal tunnel syndrome)   . Depression   . Fever blister   . GERD (gastroesophageal reflux disease)   . HA (headache)   . HTN (hypertension)   . Hypercholesterolemia   . Migraines   . OA (osteoarthritis)   . Schizo-affective psychosis ([place])     Past Surgical History:  Procedure Laterality Date  . ANTERIOR CERVICAL DECOMP/DISCECTOMY FUSION  08/27/2011   Procedure: ANTERIOR CERVICAL DECOMPRESSION/DISCECTOMY FUSION 1 LEVEL/HARDWARE REMOVAL;  Surgeon: Eustace Moore, MD;  Location: [place] [facility];  Service: Neurosurgery;  Laterality: Bilateral;  Cervical four-five Anterior cervical decompression/diskectomy, fusion, Plate, Removal of Cervical five-seven Plate  . back injection    . CARDIAC CATHETERIZATION N/A 11/09/2014   Procedure: Right Heart Cath;  Surgeon: Larey Dresser, MD;  Location: [place] [facility];  Service: Cardiovascular;  Laterality: N/A;  . [place] RELEASE  20110 rt/lt  rt x2 , lt x1  . HEMORRHOID SURGERY    . MULTIPLE TOOTH EXTRACTIONS    . NECK SURGERY  2009  . PITUITARY SURGERY     Had gland removed from producing too much calcium  . polp removed  2011  . RIGHT/LEFT HEART CATH AND CORONARY ANGIOGRAPHY  N/A 07/05/2017   Procedure: RIGHT/LEFT HEART CATH AND CORONARY ANGIOGRAPHY;  Surgeon: Larey Dresser, MD;  Location: [place] [facility];  Service: Cardiovascular;  Laterality: N/A;  . SHOULDER ARTHROSCOPY WITH ROTATOR CUFF REPAIR Right 05/23/2015   Procedure: RIGHT SHOULDER ARTHROSCOPY WITH REMOVAL OF SUTURE ANCHOR AND POSSIBLE REVISION ROTATOR CUFF REPAIR;  Surgeon: Justice Britain, MD;  Location: [place];  Service: Orthopedics;  Laterality: Right;  . SHOULDER ARTHROSCOPY WITH SUBACROMIAL DECOMPRESSION Right 01/24/2015   Procedure: RIGHT SHOULDER ARTHROSCOPY WITH SUBACROMIAL DECOMPRESSION AD DISTAL CLAVICLE RESECTION ;  Surgeon: Justice Britain, MD;  Location: [place];  Service: Orthopedics;  Laterality: Right;  Marland Kitchen VAGINAL DELIVERY     x3   Family History:  Family History  Problem Relation Age of Onset  . Coronary artery disease Father   . Cancer Father        head neck   . Hypertension Mother   . Schizophrenia Mother   . Depression Brother   . Prostate cancer Brother   . Anesthesia problems Neg Hx   . Hypotension Neg Hx   . Malignant hyperthermia Neg Hx   . Pseudochol deficiency Neg Hx   . Allergic rhinitis Neg Hx   . Angioedema Neg Hx   . Asthma Neg Hx   . Atopy Neg Hx   . Eczema Neg Hx   . Immunodeficiency Neg Hx   . Urticaria Neg Hx    Family Psychiatric  History:  Social History:  Social History   Substance and Sexual Activity  Alcohol Use No  . Alcohol/week: 0.0 oz     Social History   Substance and Sexual Activity  Drug Use No    Social History   Socioeconomic History  . Marital status: Single    Spouse name: Not on file  . Number of children: 3  . Years of education: Not on file  . Highest education level: Some college, no degree  Occupational History  . Occupation: disabled/retired  Social Needs  . Financial resource strain: Somewhat hard  . Food insecurity:    Worry: Sometimes true    Inability: Sometimes true  . Transportation needs:    Medical: Yes     Non-medical: Yes  Tobacco Use  . Smoking status: Current Some Day Smoker    Packs/day: 0.10    Years: 30.00    Pack years: 3.00    Types: Cigarettes  . Smokeless tobacco: Never Used  . Tobacco comment: using nicotrol inhaler  Substance and Sexual Activity  . Alcohol use: No    Alcohol/week: 0.0 oz  . Drug use: No  . Sexual activity: Never  Lifestyle  . Physical activity:    Days per week: 0 days    Minutes per session: 0 min  . Stress: Rather much  Relationships  . Social connections:    Talks on phone: More than three times a week    Gets together: More than three times a week    Attends religious service: More than 4 times per year    Active member of club or organization: Yes    Attends meetings of clubs or organizations: More than 4 times per year  Relationship status: Never married  Other Topics Concern  . Not on file  Social History Narrative  . Not on file   Additional Social History:                         Sleep: Good  Appetite:  Fair  Current Medications: Current Outpatient Medications  Medication Sig Dispense Refill  . aspirin EC 81 MG EC tablet Take 1 tablet (81 mg total) by mouth daily. 30 tablet 0  . atorvastatin (LIPITOR) 10 MG tablet Take 10 mg by mouth daily.    . budesonide-formoterol (SYMBICORT) 160-4.5 MCG/ACT inhaler Inhale 2 puffs into the lungs 2 (two) times daily.    . cholecalciferol (VITAMIN D) 1000 UNITS tablet Take 1,000 Units by mouth daily.    Marland Kitchen dexlansoprazole (DEXILANT) 60 MG capsule Take 1 capsule (60 mg total) by mouth daily. 30 capsule 3  . diazepam (VALIUM) 5 MG tablet 1 qam  2  qpm 90 tablet 1  . diphenhydrAMINE (BENADRYL) 25 mg capsule Take 25 mg by mouth daily as needed for allergies.    . fluticasone (FLONASE) 50 MCG/ACT nasal spray Place 1 spray into both nostrils daily.    . folic acid (FOLVITE) 1 MG tablet Take 1 mg by mouth daily.    Marland Kitchen linaclotide (LINZESS) 145 MCG CAPS capsule Take 145 mcg by mouth daily as  needed.    . metoCLOPramide (REGLAN) 10 MG tablet Take 1 tablet (10 mg total) by mouth every 6 (six) hours as needed for nausea (nausea/headache). 6 tablet 0  . Multiple Vitamins-Minerals (MULTIVITAMIN WITH MINERALS) tablet Take 1 tablet by mouth every morning.     . nortriptyline (PAMELOR) 25 MG capsule Take 2 capsules (50 mg total) by mouth at bedtime. 60 capsule 5  . oxyCODONE-acetaminophen (PERCOCET/ROXICET) 5-325 MG tablet Take 1 tablet by mouth every 8 (eight) hours as needed for severe pain.    . potassium chloride SA (K-DUR,KLOR-CON) 20 MEQ tablet Take 20 mEq by mouth 2 (two) times daily.     . QUEtiapine (SEROQUEL) 25 MG tablet Take 1 tablet (25 mg total) by mouth 2 (two) times daily. Take 1 tablet po bid and 1 prn 30 tablet 5  . triamterene-hydrochlorothiazide (DYAZIDE) 50-25 MG capsule Take 1 capsule by mouth daily.    . valACYclovir (VALTREX) 1000 MG tablet Take 1,000 mg by mouth daily.     . vitamin B-12 (CYANOCOBALAMIN) 1000 MCG tablet Take 1,000 mcg by mouth daily.    Marland Kitchen zolpidem (AMBIEN CR) 12.5 MG CR tablet Take 1 tablet (12.5 mg total) by mouth at bedtime as needed for sleep. 30 tablet 1   No current facility-administered medications for this visit.     Lab Results: No results found for this or any previous visit (from the past 48 hour(s)).  Physical Findings: AIMS:  , ,  ,  ,    CIWA:    COWS:     Musculoskeletal: Strength & Muscle Tone: within normal limits Gait & Station: normal Patient leans: N/A  Psychiatric Specialty Exam: ROS  Blood pressure 122/76, pulse 76, height 5\' 7"  (1.702 m), weight 229 lb (103.9 kg).Body mass index is 35.87 kg/m.  General Appearance: Casual  Eye Contact::  Good  Speech:  Clear and Coherent  Volume:  Normal  Mood:  Euthymic  Affect:  Congruent  Thought Process:  Coherent  Orientation:  Full (Time, Place, and Person)  Thought Content:  WDL  Suicidal Thoughts:  No  Homicidal Thoughts:  No  Memory:  NA  Judgement:  Good   Insight:  Fair  Psychomotor Activity:  Normal  Concentration:  Fair  Recall:  Good  Fund of Knowledge:Good  Language: Good  Akathisia:  No  Handed:  Right  AIMS (if indicated):     Assets:   ADL's:  Intact  Cognition: WNL  Sleep:       Treatment Plan  08/06/2017, 10:17 AM  At this time the patient will continue in therapy.we'll slightly increase her balance taking 5 mg in the morning and 10 mg at night.She'll continue all her other medications including nortriptyline Seroquel. This patient to return to see me in 7 weeks for a full 30 minute visit. This was a 10 minute visit. The patient is not in crisis. She does have a lot of stresses but she is resilient.

## 2017-08-09 ENCOUNTER — Ambulatory Visit (HOSPITAL_COMMUNITY): Payer: Self-pay | Admitting: Licensed Clinical Social Worker

## 2017-08-10 ENCOUNTER — Ambulatory Visit: Payer: Self-pay | Admitting: Allergy and Immunology

## 2017-08-11 ENCOUNTER — Ambulatory Visit (HOSPITAL_COMMUNITY)
Admission: RE | Admit: 2017-08-11 | Discharge: 2017-08-11 | Disposition: A | Discharge status: Home / Self Care | Payer: Medicaid Other | Source: Ambulatory Visit | Attending: Cardiology | Admitting: Cardiology

## 2017-08-11 ENCOUNTER — Encounter (HOSPITAL_COMMUNITY): Payer: Self-pay | Admitting: Cardiology

## 2017-08-11 ENCOUNTER — Other Ambulatory Visit: Payer: Self-pay

## 2017-08-11 VITALS — BP 119/60 | HR 82 | Wt 234.0 lb

## 2017-08-11 DIAGNOSIS — J45909 Unspecified asthma, uncomplicated: Secondary | ICD-10-CM | POA: Diagnosis not present

## 2017-08-11 DIAGNOSIS — Z87891 Personal history of nicotine dependence: Secondary | ICD-10-CM | POA: Insufficient documentation

## 2017-08-11 DIAGNOSIS — F259 Schizoaffective disorder, unspecified: Secondary | ICD-10-CM | POA: Diagnosis not present

## 2017-08-11 DIAGNOSIS — G43909 Migraine, unspecified, not intractable, without status migrainosus: Secondary | ICD-10-CM | POA: Insufficient documentation

## 2017-08-11 DIAGNOSIS — R7309 Other abnormal glucose: Secondary | ICD-10-CM | POA: Insufficient documentation

## 2017-08-11 DIAGNOSIS — R7989 Other specified abnormal findings of blood chemistry: Secondary | ICD-10-CM | POA: Insufficient documentation

## 2017-08-11 DIAGNOSIS — F319 Bipolar disorder, unspecified: Secondary | ICD-10-CM | POA: Diagnosis not present

## 2017-08-11 DIAGNOSIS — I10 Essential (primary) hypertension: Secondary | ICD-10-CM | POA: Diagnosis not present

## 2017-08-11 DIAGNOSIS — Z7982 Long term (current) use of aspirin: Secondary | ICD-10-CM | POA: Insufficient documentation

## 2017-08-11 DIAGNOSIS — Z7951 Long term (current) use of inhaled steroids: Secondary | ICD-10-CM | POA: Insufficient documentation

## 2017-08-11 DIAGNOSIS — E785 Hyperlipidemia, unspecified: Secondary | ICD-10-CM | POA: Diagnosis not present

## 2017-08-11 DIAGNOSIS — R0609 Other forms of dyspnea: Secondary | ICD-10-CM

## 2017-08-11 DIAGNOSIS — I2510 Atherosclerotic heart disease of native coronary artery without angina pectoris: Secondary | ICD-10-CM | POA: Insufficient documentation

## 2017-08-11 DIAGNOSIS — I272 Pulmonary hypertension, unspecified: Secondary | ICD-10-CM | POA: Insufficient documentation

## 2017-08-11 DIAGNOSIS — R200 Anesthesia of skin: Secondary | ICD-10-CM | POA: Diagnosis not present

## 2017-08-11 DIAGNOSIS — K219 Gastro-esophageal reflux disease without esophagitis: Secondary | ICD-10-CM | POA: Insufficient documentation

## 2017-08-11 DIAGNOSIS — Z79899 Other long term (current) drug therapy: Secondary | ICD-10-CM | POA: Insufficient documentation

## 2017-08-11 LAB — LIPID PANEL
CHOL/HDL RATIO: 4.1 ratio
Cholesterol: 176 mg/dL (ref 0–200)
HDL: 43 mg/dL (ref >40)
LDL CALC: 95 mg/dL (ref 0–99)
TRIGLYCERIDES: 189 mg/dL — AB (ref <150)
VLDL: 38 mg/dL (ref 0–40)

## 2017-08-11 LAB — VITAMIN B12: Vitamin B-12: 473 pg/mL (ref 180–914)

## 2017-08-11 LAB — TSH: TSH: 1.015 u[IU]/mL (ref 0.350–4.500)

## 2017-08-11 LAB — HEMOGLOBIN A1C
HEMOGLOBIN A1C: 6.2 % — AB (ref 4.8–5.6)
Mean Plasma Glucose: 131.24 mg/dL

## 2017-08-11 NOTE — Patient Instructions (Signed)
Labs drawn today (if we do not call you, then your lab work was stable)   Your physician recommends that you schedule a follow-up appointment in: 1 year with Dr. McLean    

## 2017-08-12 NOTE — Progress Notes (Signed)
Patient ID: Gloria Lewis, female   DOB: 1955-09-21, 62 y.o.   MRN: [id number]  PCP: Dr. Harlan Stains Pulmonary: Dr [name]  Psychiatrist : Dr Bobbie Stack  62  yo with history of HTN, smoking, and pulmonary hypertension by echo presents for cardiology evaluation and potential RHC. Patient is a long-time smoker; she is working on quitting.  She was admitted in 3/16 with chest pain that was somewhat positional.  Lexiscan Cardiolite showed no ischemia or infarction.  She had a V/Q scan that actually suggested high risk for PE, but subsequent CTA chest did not show a PE.  There was RML and lingular scarring that may have created the appearance of PE on the V/Q scan.  Echo done later in 5/16 showed preserved LV systolic function but mildly dilated RV with PA systolic pressure 42 mmHg.  She had minimal obstruction or restriction on her PFTs, but DLCO was moderately reduced.   She had right and left heart cath in 4/19. This showed normal right and left heart filling pressures with mild pulmonary hypertension.  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   She returns today for followup of dyspnea. She is breathing better now that she has been put on asthma meds.  She is not short of breath walking on flat ground.  She does get short of breath walking up stairs or carrying a load.  No chest pain. No palpitations or lightheadedness. She quit smoking in 4/19. Bilateral lower leg tingling/numbness.   PMH: 1. HTN 2. Bipolar disorder 3. OA 4. GERD 5. Schizoaffective disorder 6. Migraines 7. Prior cocaine abuse 8. Prior smoker: quit 4/19.  PFTs (5/16) with minimal obstruction, minimal restriction, moderately decreased DLCO.   9. Pulmonary hypertension: Echo (5/16) with EF 55-60%, mid-apical inferior hypokinesis, RV mildly dilated with normal systolic function, PA systolic pressure 42 mmHg. PFTs as above.  V/Q scan (4/16) high probability for PE => but CTA chest (4/16) showed no PE, RML and lingular scarring that may  have made the V/Q scan appear positive.  - Echo (3/19): EF 60-65%, PASP 41 mmHg.  - RHC (4/19): mean RA 7, PA 42/13 mean 25, mean PCWP 9, CI 2.75, PVR 2.7 WU 10.  CAD:  Lexiscan Cardiolite (3/16) with EF 57%, no ischemia or infarction.  - LHC (4/19): Nonobstructive coronary disease.  11. Asthma  SH: Lives alone.  Prior cocaine abuse, none now.  She quit smoking in 4/19.    FH: Father with probable sudden cardiac death at age 74  ROS: All systems reviewed and negative except as per HPI.   Current Outpatient Medications  Medication Sig Dispense Refill  . aspirin EC 81 MG EC tablet Take 1 tablet (81 mg total) by mouth daily. 30 tablet 0  . atorvastatin (LIPITOR) 10 MG tablet Take 10 mg by mouth daily.    . budesonide-formoterol (SYMBICORT) 160-4.5 MCG/ACT inhaler Inhale 2 puffs into the lungs 2 (two) times daily.    . cholecalciferol (VITAMIN D) 1000 UNITS tablet Take 1,000 Units by mouth daily.    Marland Kitchen dexlansoprazole (DEXILANT) 60 MG capsule Take 1 capsule (60 mg total) by mouth daily. 30 capsule 3  . diazepam (VALIUM) 5 MG tablet 1 qam  2  qpm 90 tablet 1  . diphenhydrAMINE (BENADRYL) 25 mg capsule Take 25 mg by mouth daily as needed for allergies.    . fluticasone (FLONASE) 50 MCG/ACT nasal spray Place 1 spray into both nostrils daily.    . folic acid (FOLVITE) 1 MG  tablet Take 1 mg by mouth daily.    Marland Kitchen linaclotide (LINZESS) 145 MCG CAPS capsule Take 145 mcg by mouth daily as needed.    . metoCLOPramide (REGLAN) 10 MG tablet Take 1 tablet (10 mg total) by mouth every 6 (six) hours as needed for nausea (nausea/headache). 6 tablet 0  . Multiple Vitamins-Minerals (MULTIVITAMIN WITH MINERALS) tablet Take 1 tablet by mouth every morning.     . nortriptyline (PAMELOR) 25 MG capsule Take 2 capsules (50 mg total) by mouth at bedtime. 60 capsule 5  . oxyCODONE-acetaminophen (PERCOCET/ROXICET) 5-325 MG tablet Take 1 tablet by mouth every 8 (eight) hours as needed for severe pain.    . potassium  chloride SA (K-DUR,KLOR-CON) 20 MEQ tablet Take 20 mEq by mouth 2 (two) times daily.     . QUEtiapine (SEROQUEL) 25 MG tablet Take 1 tablet (25 mg total) by mouth 2 (two) times daily. Take 1 tablet po bid and 1 prn 30 tablet 5  . triamterene-hydrochlorothiazide (DYAZIDE) 50-25 MG capsule Take 1 capsule by mouth daily.    . valACYclovir (VALTREX) 1000 MG tablet Take 1,000 mg by mouth daily.     . vitamin B-12 (CYANOCOBALAMIN) 1000 MCG tablet Take 1,000 mcg by mouth daily.    Marland Kitchen zolpidem (AMBIEN CR) 12.5 MG CR tablet Take 1 tablet (12.5 mg total) by mouth at bedtime as needed for sleep. 30 tablet 1   No current facility-administered medications for this encounter.    BP 119/60   Pulse 82   Wt 234 lb (106.1 kg)   SpO2 100%   BMI 36.65 kg/m   Filed Weights   08/11/17 1013  Weight: 234 lb (106.1 kg)   General: NAD Neck: No JVD, no thyromegaly or thyroid nodule.  Lungs: Clear to auscultation bilaterally with normal respiratory effort. CV: Nondisplaced PMI.  Heart regular S1/S2, no S3/S4, 1/6 SEM RUSB.  No peripheral edema.  No carotid bruit.  Normal pedal pulses.  Abdomen: Soft, nontender, no hepatosplenomegaly, no distention.  Skin: Intact without lesions or rashes.  Neurologic: Alert and oriented x 3.  Psych: Normal affect. Extremities: No clubbing or cyanosis.  HEENT: Normal.   Assessment/Plan: 1. CAD: H/o atypical chest pain.  She had nonobstructive coronary disease on 4/19 cath.  - Check lipids on atorvastatin given presence of CAD (nonobstructive).  2. Pulmonary hypertension: Last echoes have shown mild elevation of PA pressure and mild RV dilation.  PFTs show neither significant restriction nor obstruction, but DLCO is low.  This could be due to pulmonary vascular disease.  There was concern for PE given V/Q scan, but CTA chest showed no PE and did show some scarring that could have caused a false positive V/Q.  She does not appear volume overloaded.  RHC was done, showing mild  pulmonary hypertension with PVR 2.7.  This is most likely group 3 PH due to some degree of COPD.    - It would be reasonable to have her do a sleep study.  3. ?PE: Question of PE based on V/Q in 4/16 but CTA chest showed no PE and did show scarring that may have caused false positive.   4. Smoking: She quit in 4/19. I congratulated her.  5. Bilateral lower leg tingling and numbness: Could PT pulses, doubt PAD.  Suspect peripheral neuropathy.  - I will order B12, TSH, and hgbA1c.   - She will need to followup with her PCP about this.  Followup in 1 year.   Loralie Champagne 08/12/2017

## 2017-08-13 ENCOUNTER — Encounter (HOSPITAL_COMMUNITY): Payer: Self-pay

## 2017-08-25 NOTE — Progress Notes (Signed)
This encounter was created in error - please disregard.

## 2017-09-09 ENCOUNTER — Ambulatory Visit: Payer: Medicaid Other | Admitting: Podiatry

## 2017-09-15 ENCOUNTER — Ambulatory Visit: Payer: Medicaid Other | Admitting: Podiatry

## 2017-09-15 ENCOUNTER — Other Ambulatory Visit: Payer: Self-pay | Admitting: Podiatry

## 2017-09-15 ENCOUNTER — Ambulatory Visit (INDEPENDENT_AMBULATORY_CARE_PROVIDER_SITE_OTHER): Payer: Medicaid Other

## 2017-09-15 ENCOUNTER — Encounter: Payer: Self-pay | Admitting: Podiatry

## 2017-09-15 DIAGNOSIS — M722 Plantar fascial fibromatosis: Secondary | ICD-10-CM

## 2017-09-15 DIAGNOSIS — M79671 Pain in right foot: Secondary | ICD-10-CM

## 2017-09-15 DIAGNOSIS — M79672 Pain in left foot: Principal | ICD-10-CM

## 2017-09-15 MED ORDER — TRIAMCINOLONE ACETONIDE 10 MG/ML IJ SUSP
10.0000 mg | Freq: Once | INTRAMUSCULAR | Status: AC
  Administered 2017-09-15: 10 mg

## 2017-09-15 NOTE — Patient Instructions (Signed)

## 2017-09-15 NOTE — Progress Notes (Signed)
Subjective:   Patient ID: Gloria Lewis, female   DOB: 62 y.o.   MRN: [id number]   HPI Patient states my arches of started to really get sore and is been going on worse the last few months but has had on and off problems with it for a long time   ROS      Objective:  Physical Exam  Neurovascular status intact with inflammation pain of the mid arch area bilateral     Assessment:  Fasciitis of the mid arch area bilateral     Plan:  Reviewed condition recommended physical therapy and at this time reinjected the plantar fascial bilateral mid arch 3 mg Kenalog 5 mg Xylocaine and dispensed compression stockings

## 2017-09-29 ENCOUNTER — Other Ambulatory Visit (HOSPITAL_COMMUNITY): Payer: Self-pay | Admitting: Psychiatry

## 2017-10-06 ENCOUNTER — Other Ambulatory Visit: Payer: Self-pay | Admitting: Internal Medicine

## 2017-10-06 DIAGNOSIS — Z1231 Encounter for screening mammogram for malignant neoplasm of breast: Secondary | ICD-10-CM

## 2017-10-11 ENCOUNTER — Telehealth (HOSPITAL_COMMUNITY): Payer: Self-pay

## 2017-10-11 NOTE — Telephone Encounter (Signed)
Received a phone call from pharmacy requesting a refill on Diazepam 5mg  tabs. Next appointment is scheduled for 10-27-17. Please send refill to [facility]. Please advise

## 2017-10-15 ENCOUNTER — Other Ambulatory Visit (HOSPITAL_COMMUNITY): Payer: Self-pay

## 2017-10-15 MED ORDER — DIAZEPAM 5 MG PO TABS: ORAL_TABLET | ORAL | 0 refills | Status: DC

## 2017-10-27 ENCOUNTER — Ambulatory Visit (INDEPENDENT_AMBULATORY_CARE_PROVIDER_SITE_OTHER): Payer: Medicaid Other | Admitting: Psychiatry

## 2017-10-27 ENCOUNTER — Encounter (HOSPITAL_COMMUNITY): Payer: Self-pay | Admitting: Psychiatry

## 2017-10-27 VITALS — BP 132/70 | HR 80 | Ht 67.0 in | Wt 232.0 lb

## 2017-10-27 DIAGNOSIS — Z79899 Other long term (current) drug therapy: Secondary | ICD-10-CM | POA: Diagnosis not present

## 2017-10-27 DIAGNOSIS — F339 Major depressive disorder, recurrent, unspecified: Secondary | ICD-10-CM | POA: Diagnosis not present

## 2017-10-27 MED ORDER — DOXEPIN HCL 50 MG PO CAPS: ORAL_CAPSULE | ORAL | 4 refills | Status: DC

## 2017-10-27 MED ORDER — NORTRIPTYLINE HCL 25 MG PO CAPS: 50.0000 mg | ORAL_CAPSULE | Freq: Every day | ORAL | 5 refills | Status: DC

## 2017-10-27 MED ORDER — QUETIAPINE FUMARATE 25 MG PO TABS: 25.0000 mg | ORAL_TABLET | Freq: Two times a day (BID) | ORAL | 5 refills | Status: DC
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
## 2017-10-28 NOTE — Telephone Encounter (Signed)
Completed patient came in her appointment on 10-27-17. Issues were address at appointment

## 2017-11-01 ENCOUNTER — Other Ambulatory Visit (HOSPITAL_COMMUNITY): Payer: Self-pay

## 2017-11-01 MED ORDER — QUETIAPINE FUMARATE 25 MG PO TABS: ORAL_TABLET | ORAL | 5 refills | Status: DC

## 2017-11-06 ENCOUNTER — Other Ambulatory Visit: Payer: Self-pay

## 2017-11-06 ENCOUNTER — Encounter (HOSPITAL_COMMUNITY): Payer: Self-pay | Admitting: Emergency Medicine

## 2017-11-06 ENCOUNTER — Emergency Department (HOSPITAL_COMMUNITY)
Admission: EM | Admit: 2017-11-06 | Discharge: 2017-11-06 | Disposition: A | Discharge status: Home / Self Care | Payer: Medicaid Other | Attending: Emergency Medicine | Admitting: Emergency Medicine

## 2017-11-06 DIAGNOSIS — M62838 Other muscle spasm: Secondary | ICD-10-CM | POA: Insufficient documentation

## 2017-11-06 DIAGNOSIS — F17210 Nicotine dependence, cigarettes, uncomplicated: Secondary | ICD-10-CM | POA: Diagnosis not present

## 2017-11-06 DIAGNOSIS — Z955 Presence of coronary angioplasty implant and graft: Secondary | ICD-10-CM | POA: Insufficient documentation

## 2017-11-06 DIAGNOSIS — Z91040 Latex allergy status: Secondary | ICD-10-CM | POA: Diagnosis not present

## 2017-11-06 DIAGNOSIS — Z79899 Other long term (current) drug therapy: Secondary | ICD-10-CM | POA: Insufficient documentation

## 2017-11-06 DIAGNOSIS — I10 Essential (primary) hypertension: Secondary | ICD-10-CM | POA: Insufficient documentation

## 2017-11-06 DIAGNOSIS — J449 Chronic obstructive pulmonary disease, unspecified: Secondary | ICD-10-CM | POA: Insufficient documentation

## 2017-11-06 DIAGNOSIS — R52 Pain, unspecified: Secondary | ICD-10-CM | POA: Diagnosis present

## 2017-11-06 DIAGNOSIS — Z7982 Long term (current) use of aspirin: Secondary | ICD-10-CM | POA: Diagnosis not present

## 2017-11-06 LAB — CBC WITH DIFFERENTIAL/PLATELET
ABS IMMATURE GRANULOCYTES: 0 10*3/uL (ref 0.0–0.1)
Basophils Absolute: 0.1 10*3/uL (ref 0.0–0.1)
Basophils Relative: 1 %
Eosinophils Absolute: 0.2 10*3/uL (ref 0.0–0.7)
Eosinophils Relative: 3 %
HEMATOCRIT: 41 % (ref 36.0–46.0)
HEMOGLOBIN: 12.9 g/dL (ref 12.0–15.0)
IMMATURE GRANULOCYTES: 0 %
LYMPHS PCT: 35 %
Lymphs Abs: 2.9 10*3/uL (ref 0.7–4.0)
MCH: 28.6 pg (ref 26.0–34.0)
MCHC: 31.5 g/dL (ref 30.0–36.0)
MCV: 90.9 fL (ref 78.0–100.0)
MONOS PCT: 8 %
Monocytes Absolute: 0.6 10*3/uL (ref 0.1–1.0)
NEUTROS ABS: 4.4 10*3/uL (ref 1.7–7.7)
Neutrophils Relative %: 53 %
Platelets: 363 10*3/uL (ref 150–400)
RBC: 4.51 MIL/uL (ref 3.87–5.11)
RDW: 14.3 % (ref 11.5–15.5)
WBC: 8.1 10*3/uL (ref 4.0–10.5)

## 2017-11-06 LAB — COMPREHENSIVE METABOLIC PANEL
ALBUMIN: 3.7 g/dL (ref 3.5–5.0)
ALT: 20 U/L (ref 0–44)
ANION GAP: 8 (ref 5–15)
AST: 32 U/L (ref 15–41)
Alkaline Phosphatase: 104 U/L (ref 38–126)
BILIRUBIN TOTAL: 0.7 mg/dL (ref 0.3–1.2)
BUN: 12 mg/dL (ref 8–23)
CHLORIDE: 100 mmol/L (ref 98–111)
CO2: 30 mmol/L (ref 22–32)
Calcium: 9.2 mg/dL (ref 8.9–10.3)
Creatinine, Ser: 0.91 mg/dL (ref 0.44–1.00)
GFR calc Af Amer: >60 mL/min (ref >60)
GFR calc non Af Amer: >60 mL/min (ref >60)
GLUCOSE: 98 mg/dL (ref 70–99)
POTASSIUM: 4 mmol/L (ref 3.5–5.1)
SODIUM: 138 mmol/L (ref 135–145)
TOTAL PROTEIN: 6.8 g/dL (ref 6.5–8.1)

## 2017-11-06 MED ORDER — METHOCARBAMOL 750 MG PO TABS: 750.0000 mg | ORAL_TABLET | Freq: Three times a day (TID) | ORAL | 0 refills | Status: DC | PRN

## 2017-11-06 NOTE — ED Provider Notes (Signed)
[facility] [facility] EMERGENCY DEPARTMENT Provider Note   CSN: [id number] Arrival date & time: 11/06/17  1104     History   Chief Complaint Chief Complaint  Patient presents with  . Generalized Body Aches    HPI Gloria Lewis is a 62 y.o. female with past medical history of bipolar 1, schizoaffective psychosis, angina, who presents today for evaluation of charley horses.  She reports that over the past 2 weeks she has had generalized diffuse intermittent muscle.  It has happened in both of her legs and arms.  She is unsure what causes it, states that it happens randomly.  She does state that she started Lasix about 1 month ago and that her symptoms began 2 to 3 weeks ago.  She denies any changes in her urine color.  Reports that she is staying well-hydrated.  She saw her primary care provider for this yesterday who was unable to give her an answer for her symptoms.  She reports compliance with her potassium supplements.  HPI  Past Medical History:  Diagnosis Date  . Anginal pain ([place])    admit 06/2014; had non-ischemic stress test  . Anxiety   . Bipolar 1 disorder ([place])   . Colon polyp   . CTS (carpal tunnel syndrome)   . Depression   . Fever blister   . GERD (gastroesophageal reflux disease)   . HA (headache)   . HTN (hypertension)   . Hypercholesterolemia   . Migraines   . OA (osteoarthritis)   . Schizo-affective psychosis [facility])     Patient Active Problem List   Diagnosis Date Noted  . Infectious gastroenteritis 04/16/2016  . Xerostomia 03/09/2016  . Surgery, elective 05/23/2015  . S/P arthroscopy of shoulder 05/23/2015  . Chronic migraine without aura without status migrainosus, not intractable 10/18/2014  . Tobacco abuse 10/18/2014  . Obesity 09/23/2014  . COPD 09/02/2014  . Pulmonary hypertension ([place]) 08/31/2014  . Respiratory failure with hypoxia ([place]) 08/14/2014  . Cigarette smoker 07/28/2014  . Major depressive disorder, recurrent episode,  moderate ([place]) 07/06/2014  . Essential hypertension   . SOB (shortness of breath) 06/21/2014  . Precordial pain 06/21/2014  . Gastroesophageal reflux disease 06/21/2014  . Chest pain 06/21/2014  . HTN (hypertension)   . Neck pain 01/08/2014  . Major depressive disorder, recurrent episode, severe, without mention of psychotic behavior 10/14/2012  . Schizoaffective disorder ([place]) 05/26/2012  . Parathyroid adenoma 10/06/2010  . Hyperparathyroidism, primary ([place]) 10/06/2010  . DEGENERATIVE DISC DISEASE, LUMBOSACRAL SPINE 05/21/2007  . DERMATOPHYTOSIS OF THE BODY 05/10/2007  . Depressive type psychosis ([place]) 03/24/2007  . Anxiety state 03/24/2007  . DENTAL PAIN 03/24/2007  . SHOULDER PAIN, LEFT 03/24/2007    Past Surgical History:  Procedure Laterality Date  . ANTERIOR CERVICAL DECOMP/DISCECTOMY FUSION  08/27/2011   Procedure: ANTERIOR CERVICAL DECOMPRESSION/DISCECTOMY FUSION 1 LEVEL/HARDWARE REMOVAL;  Surgeon: Eustace Moore, MD;  Location: [place] [facility];  Service: Neurosurgery;  Laterality: Bilateral;  Cervical four-five Anterior cervical decompression/diskectomy, fusion, Plate, Removal of Cervical five-seven Plate  . back injection    . CARDIAC CATHETERIZATION N/A 11/09/2014   Procedure: Right Heart Cath;  Surgeon: Larey Dresser, MD;  Location: [place] CV LAB;  Service: Cardiovascular;  Laterality: N/A;  . CARPAL TUNNEL RELEASE  20110 rt/lt   rt x2 , lt x1  . HEMORRHOID SURGERY    . MULTIPLE TOOTH EXTRACTIONS    . NECK SURGERY  2009  . PITUITARY SURGERY     Had gland removed  from producing too much calcium  . polp removed  2011  . RIGHT/LEFT HEART CATH AND CORONARY ANGIOGRAPHY N/A 07/05/2017   Procedure: RIGHT/LEFT HEART CATH AND CORONARY ANGIOGRAPHY;  Surgeon: Larey Dresser, MD;  Location: [place] CV LAB;  Service: Cardiovascular;  Laterality: N/A;  . SHOULDER ARTHROSCOPY WITH ROTATOR CUFF REPAIR Right 05/23/2015   Procedure: RIGHT SHOULDER ARTHROSCOPY WITH REMOVAL OF SUTURE  ANCHOR AND POSSIBLE REVISION ROTATOR CUFF REPAIR;  Surgeon: Justice Britain, MD;  Location: [facility];  Service: Orthopedics;  Laterality: Right;  . SHOULDER ARTHROSCOPY WITH SUBACROMIAL DECOMPRESSION Right 01/24/2015   Procedure: RIGHT SHOULDER ARTHROSCOPY WITH SUBACROMIAL DECOMPRESSION AD DISTAL CLAVICLE RESECTION ;  Surgeon: Justice Britain, MD;  Location: [place];  Service: Orthopedics;  Laterality: Right;  Marland Kitchen VAGINAL DELIVERY     x3     OB History   None      Home Medications    Prior to Admission medications   Medication Sig Start Date End Date Taking? Authorizing Provider  aspirin EC 81 MG EC tablet Take 1 tablet (81 mg total) by mouth daily. 06/23/14  Yes Tat, Shanon Brow, MD  atorvastatin (LIPITOR) 10 MG tablet Take 10 mg by mouth at bedtime.    Yes [provider]  budesonide-formoterol (SYMBICORT) 160-4.5 MCG/ACT inhaler Inhale 2 puffs into the lungs 2 (two) times daily.   Yes [provider]  cholecalciferol (VITAMIN D) 1000 UNITS tablet Take 1,000 Units by mouth daily.   Yes [provider]  cyclobenzaprine (FLEXERIL) 5 MG tablet TAKE 2 TABLETS BY MOUTH 3 TIMES DAILY AS NEEDED 07/30/17  Yes [provider]  Dexlansoprazole 30 MG capsule Take 30 mg by mouth daily.   Yes [provider]  diazepam (VALIUM) 5 MG tablet 1 qam  2  qpm Patient taking differently: Take 5-10 mg by mouth See admin instructions. Take 5mg  in the morning, and 10mg  in the evening 10/15/17  Yes Plovsky, Berneta Sages, MD  doxepin (SINEQUAN) 50 MG capsule 1  qhs Patient taking differently: Take 50 mg by mouth at bedtime.  10/27/17  Yes Plovsky, Berneta Sages, MD  fluticasone (FLONASE) 50 MCG/ACT nasal spray Place 1 spray into both nostrils daily.   Yes [provider]  folic acid (FOLVITE) 1 MG tablet Take 1 mg by mouth daily.   Yes [provider]  furosemide (LASIX) 20 MG tablet Take 20 mg by mouth.   Yes [provider]  linaclotide (LINZESS) 145 MCG CAPS capsule Take  145 mcg by mouth daily as needed (costipation).    Yes [provider]  magic mouthwash SOLN Take 5 mLs by mouth 3 (three) times daily as needed for mouth pain.   Yes [provider]  metoCLOPramide (REGLAN) 10 MG tablet Take 1 tablet (10 mg total) by mouth every 6 (six) hours as needed for nausea (nausea/headache). 03/21/15  Yes Street, [place], PA-C  Multiple Vitamins-Minerals (MULTIVITAMIN WITH MINERALS) tablet Take 1 tablet by mouth every morning.    Yes [provider]  nortriptyline (PAMELOR) 25 MG capsule Take 2 capsules (50 mg total) by mouth at bedtime. 10/27/17  Yes Plovsky, Berneta Sages, MD  oxyCODONE-acetaminophen (PERCOCET) 10-325 MG tablet Take 1 tablet by mouth 2 (two) times daily.   Yes [provider]  PAZEO 0.7 % SOLN PLACE 1 DROP INTO BOTH EYES ONCE DAILY as needed for allergies 08/09/17  Yes [provider]  potassium chloride SA (K-DUR,KLOR-CON) 20 MEQ tablet Take 20 mEq by mouth 2 (two) times daily.    Yes  [provider]  QUEtiapine (SEROQUEL) 25 MG tablet Take 1 tablet po bid and 1 prn 11/01/17  Yes Plovsky, Berneta Sages, MD  triamterene-hydrochlorothiazide (DYAZIDE) 50-25 MG capsule Take 1 capsule by mouth daily.   Yes [provider]  valACYclovir (VALTREX) 1000 MG tablet Take 1,000 mg by mouth daily.    Yes [provider]  vitamin B-12 (CYANOCOBALAMIN) 1000 MCG tablet Take 1,000 mcg by mouth daily.   Yes [provider]  dexlansoprazole (DEXILANT) 60 MG capsule Take 1 capsule (60 mg total) by mouth daily. Patient not taking: Reported on 11/06/2017 01/19/17   Zehr, Laban Emperor, PA-C  methocarbamol (ROBAXIN) 750 MG tablet Take 1-2 tablets (750-1,500 mg total) by mouth 3 (three) times daily as needed for muscle spasms. 11/06/17   Lorin Glass, PA-C  zolpidem (AMBIEN CR) 12.5 MG CR tablet Take 1 tablet (12.5 mg total) by mouth at bedtime as needed for sleep. Patient not taking: Reported on 11/06/2017 07/23/17    Norma Fredrickson, MD    Family History Family History  Problem Relation Age of Onset  . Coronary artery disease Father   . Cancer Father        head neck   . Hypertension Mother   . Schizophrenia Mother   . Depression Brother   . Prostate cancer Brother   . Anesthesia problems Neg Hx   . Hypotension Neg Hx   . Malignant hyperthermia Neg Hx   . Pseudochol deficiency Neg Hx   . Allergic rhinitis Neg Hx   . Angioedema Neg Hx   . Asthma Neg Hx   . Atopy Neg Hx   . Eczema Neg Hx   . Immunodeficiency Neg Hx   . Urticaria Neg Hx     Social History Social History   Tobacco Use  . Smoking status: Current Some Day Smoker    Packs/day: 0.10    Years: 30.00    Pack years: 3.00    Types: Cigarettes  . Smokeless tobacco: Never Used  . Tobacco comment: using nicotrol inhaler  Substance Use Topics  . Alcohol use: No    Alcohol/week: 0.0 oz  . Drug use: No     Allergies   Effexor [venlafaxine hydrochloride]; Latex; Penicillins; Zithromax [azithromycin dihydrate]; Amlodipine; Atorvastatin; Butrans [buprenorphine]; Codeine; Nucynta [tapentadol]; Vicodin [hydrocodone-acetaminophen]; Chantix [varenicline tartrate]; Paroxetine hcl; and Tramadol   Review of Systems Review of Systems  Constitutional: Negative for chills and fever.  Respiratory: Negative for chest tightness and shortness of breath.   Cardiovascular: Negative for chest pain and palpitations.  Genitourinary: Negative for decreased urine volume, difficulty urinating, dysuria and urgency.  Musculoskeletal: Negative for joint swelling and myalgias.       Muscle spasms  Neurological: Negative for weakness and numbness.  All other systems reviewed and are negative.    Physical Exam Updated Vital Signs BP 126/73 (BP Location: Right Arm)   Pulse 78   Temp 99.2 F (37.3 C) (Oral)   Resp 18   SpO2 97%   Physical Exam  Constitutional: She appears well-developed and well-nourished. No distress.  HENT:  Head:  Normocephalic and atraumatic.  Mouth/Throat: Oropharynx is clear and moist.  Eyes: Conjunctivae are normal. Right eye exhibits no discharge. Left eye exhibits no discharge. No scleral icterus.  Neck: Normal range of motion.  Cardiovascular: Normal rate, regular rhythm and intact distal pulses. Exam reveals no friction rub.  No murmur heard. Pulmonary/Chest: Effort normal. No stridor. No respiratory distress.  Abdominal: Soft. She exhibits no distension. There is  no tenderness. There is no guarding.  Musculoskeletal: Normal range of motion. She exhibits no edema, tenderness or deformity.  There is no tenderness to palpation over large muscle groups in bilateral arms and legs.  No evidence of spasm at the time of my evaluation.  Neurological: She is alert. She exhibits normal muscle tone.  Skin: Skin is warm and dry. She is not diaphoretic.  Psychiatric: She has a normal mood and affect. Her behavior is normal.  Nursing note and vitals reviewed.    ED Treatments / Results  Labs (all labs ordered are listed, but only abnormal results are displayed) Labs Reviewed  COMPREHENSIVE METABOLIC PANEL  CBC WITH DIFFERENTIAL/PLATELET  URINALYSIS, ROUTINE W REFLEX MICROSCOPIC    EKG None  Radiology No results found.  Procedures Procedures (including critical care time)  Medications Ordered in ED Medications - No data to display   Initial Impression / Assessment and Plan / ED Course  I have reviewed the triage vital signs and the nursing notes.  Pertinent labs & imaging results that were available during my care of the patient were reviewed by me and considered in my medical decision making (see chart for details).    Patient presents today for evaluation of reported muscle spasms.  She reports that these have been happening over the past 2 weeks and her bilateral arms and legs.  She went to her primary care doctor yesterday and reports that they were unable to give her an answer.  Labs  were obtained and reviewed, potassium is normal, she is without significant electrolyte or hematologic abnormalities.  She was offered CK testing, in addition to UA, both of which she made the informed decision to decline.  At the time of discharge patient reported that she did not have any other concerns or complaints today that had not been addressed.  She was given the option to ask questions, all of which were answered to the best my abilities, she requested discharge.  Per her request will try her on Robaxin instead of Flexeril.  She was advised not to take flexeril while taking robaxin.    Final Clinical Impressions(s) / ED Diagnoses   Final diagnoses:  Muscle spasm    ED Discharge Orders        Ordered    methocarbamol (ROBAXIN) 750 MG tablet  3 times daily PRN     11/06/17 1351       Lorin Glass, [place] 11/06/17 1358    Charlesetta Shanks, MD 11/08/17 1736

## 2017-11-06 NOTE — ED Notes (Signed)
Pt unable to give urine sample at this time 

## 2017-11-06 NOTE — ED Triage Notes (Signed)
Pt to ER for "charlie horses" to her right leg. Pt reports started lasix about 1 month ago and these symptoms began 1-2 weeks ago. VSS.

## 2017-11-06 NOTE — Discharge Instructions (Addendum)
I have sent your prescription to your requested pharmacy.  Today your labs did not show any abnormalities that would be causing her symptoms.  You were offered urine testing along with testing for muscle breakdown markers which she declined.  Please call your primary care provider for an appointment and follow-up with them.  If you have any concerns, your symptoms worsen, you develop fevers, changes in your urine, please seek additional medical care and evaluation.  The Robaxin may make you drowsy and sleepy.  Please do not drive, operate heavy machinery, or perform other potentially dangerous tasks while taking this medication.

## 2017-11-06 NOTE — ED Notes (Signed)
Patient verbalizes understanding of discharge instructions. Opportunity for questioning and answers were provided. Ambulatory at discharge in NAD.  

## 2017-11-06 NOTE — ED Notes (Signed)
Pt now endorsing urinary difficulty, states not "going as often as I usually do."

## 2017-11-16 ENCOUNTER — Telehealth (HOSPITAL_COMMUNITY): Payer: Self-pay

## 2017-11-16 NOTE — Telephone Encounter (Signed)
Patient is calling for something for her anxiety. She had to stop the Valium due to pain management, but needs something to help. Please review and advise, thank you

## 2017-11-22 NOTE — Telephone Encounter (Signed)
Patient is calling again about something for her anxiety. She would like to go back on the Valium.

## 2017-12-08 ENCOUNTER — Ambulatory Visit: Payer: Self-pay | Admitting: Skilled Nursing Facility1

## 2017-12-22 ENCOUNTER — Encounter

## 2017-12-22 ENCOUNTER — Ambulatory Visit (INDEPENDENT_AMBULATORY_CARE_PROVIDER_SITE_OTHER): Payer: Medicaid Other | Admitting: Psychiatry

## 2017-12-22 ENCOUNTER — Encounter (HOSPITAL_COMMUNITY): Payer: Self-pay | Admitting: Psychiatry

## 2017-12-22 VITALS — BP 130/70 | HR 72 | Ht 67.0 in | Wt 237.0 lb

## 2017-12-22 DIAGNOSIS — F329 Major depressive disorder, single episode, unspecified: Secondary | ICD-10-CM

## 2017-12-22 MED ORDER — DIAZEPAM 5 MG PO TABS: 5.0000 mg | ORAL_TABLET | ORAL | 1 refills | Status: DC

## 2017-12-22 NOTE — Progress Notes (Signed)
Patient ID: Gloria Lewis, female   DOB: Aug 29, 1955, 62 y.o.   MRN: [id number] [facility] MD Progress Note  12/22/2017 4:32 PM Gloria Lewis  MRN:  [id number] Subjective:  Shoulder hurting Principal Problem: Major Depression,recurent Mild Diagnosis: Major Depression, Recurent  At this time the patient is experiencing a great deal of stress. They're related to the fact he is having problems and housing. Patient feels extremely anxious. Other complications are that her pain doctor will not give her Percocet while she takes Valium. At this time and esophagus she's chosen to be on Valium. Controlling her anxiety for her seems to be time pain control. She will therefore to take Valium and I will restart for her. This patient is been very reliable. She has extreme pain great problems and housing. The patient is asked her son to leave who is chronically mentally ill causes more problems. The patient is sleeping fairly well taking oxygen. She's eating well. She just recently has been diagnosed with diabetes. The patient volunteers a lot. She comes with a homeless. The patient is no evidence of psychosis at this time. She uses no drugs or alcohol.She denies chest pain shortness of breath or any visual problems. She is no neurological problems.She is completely clean of drugs and alcohol time. Patient is always been very reliable. Living are Ttoday we generated a letter for her landlord saying the patient is generally stable and is responsible to take care of her home. Patient Active Problem List   Diagnosis Date Noted  . Infectious gastroenteritis [A09] 04/16/2016  . Xerostomia [R68.2] 03/09/2016  . Surgery, elective [Z41.9] 05/23/2015  . S/P arthroscopy of shoulder [Z98.890] 05/23/2015  . Chronic migraine without aura without status migrainosus, not intractable [G43.709] 10/18/2014  . Tobacco abuse [Z72.0] 10/18/2014  . Obesity [E66.9] 09/23/2014  . COPD [J44.9] 09/02/2014  . Pulmonary hypertension ([place])  [I27.20] 08/31/2014  . Respiratory failure with hypoxia ([place]) [J96.91] 08/14/2014  . Cigarette smoker [F17.210] 07/28/2014  . Major depressive disorder, recurrent episode, moderate ([place]) [F33.1] 07/06/2014  . Essential hypertension [I10]   . SOB (shortness of breath) [R06.02] 06/21/2014  . Precordial pain [R07.2] 06/21/2014  . Gastroesophageal reflux disease [K21.9] 06/21/2014  . Chest pain [R07.9] 06/21/2014  . HTN (hypertension) [I10]   . Neck pain [M54.2] 01/08/2014  . Major depressive disorder, recurrent episode, severe, without mention of psychotic behavior [F33.2] 10/14/2012  . Schizoaffective disorder ([place]) [F25.9] 05/26/2012  . 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 Hyperparathyroidism, primary ([place]) [E21.0] 10/06/2010  . DEGENERATIVE DISC DISEASE, LUMBOSACRAL SPINE [M51.37] 05/21/2007  . DERMATOPHYTOSIS OF THE BODY [B35.4] 05/10/2007  . 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 Anxiety state [F41.1] 03/24/2007  . DENTAL PAIN [K08.9] 03/24/2007  . SHOULDER PAIN, LEFT [M25.519] 03/24/2007   Total Time spent with patient:30 min  Past Psychiatric History:   Past Medical History:  Past Medical History:  Diagnosis Date  . Anginal pain ([place])    admit 06/2014; had non-ischemic stress test  . Anxiety   . Bipolar 1 disorder ([place])   . Colon polyp   . CTS (carpal tunnel syndrome)   . Depression   . Fever blister   . GERD (gastroesophageal reflux disease)   . HA (headache)   . HTN (hypertension)   . Hypercholesterolemia   . Migraines   . OA (osteoarthritis)   . Schizo-affective psychosis ([place])     Past Surgical History:  Procedure Laterality Date  . ANTERIOR CERVICAL DECOMP/DISCECTOMY FUSION  08/27/2011   Procedure: ANTERIOR  CERVICAL DECOMPRESSION/DISCECTOMY FUSION 1 LEVEL/HARDWARE REMOVAL;  Surgeon: Eustace Moore, MD;  Location: [place];  Service: Neurosurgery;  Laterality: Bilateral;  Cervical four-five Anterior cervical decompression/diskectomy,  fusion, Plate, Removal of Cervical five-seven Plate  . back injection    . CARDIAC CATHETERIZATION N/A 11/09/2014   Procedure: Right Heart Cath;  Surgeon: Larey Dresser, MD;  Location: [place] [facility];  Service: Cardiovascular;  Laterality: N/A;  . CARPAL TUNNEL RELEASE  20110 rt/lt   rt x2 , lt x1  . HEMORRHOID SURGERY    . MULTIPLE TOOTH EXTRACTIONS    . NECK SURGERY  2009  . PITUITARY SURGERY     Had gland removed from producing too much calcium  . polp removed  2011  . RIGHT/LEFT HEART CATH AND CORONARY ANGIOGRAPHY N/A 07/05/2017   Procedure: RIGHT/LEFT HEART CATH AND CORONARY ANGIOGRAPHY;  Surgeon: Larey Dresser, MD;  Location: [place] [facility];  Service: Cardiovascular;  Laterality: N/A;  . SHOULDER ARTHROSCOPY WITH ROTATOR CUFF REPAIR Right 05/23/2015   Procedure: RIGHT SHOULDER ARTHROSCOPY WITH REMOVAL OF SUTURE ANCHOR AND POSSIBLE REVISION ROTATOR CUFF REPAIR;  Surgeon: Justice Britain, MD;  Location: [facility];  Service: Orthopedics;  Laterality: Right;  . SHOULDER ARTHROSCOPY WITH SUBACROMIAL DECOMPRESSION Right 01/24/2015   Procedure: RIGHT SHOULDER ARTHROSCOPY WITH SUBACROMIAL DECOMPRESSION AD DISTAL CLAVICLE RESECTION ;  Surgeon: Justice Britain, MD;  Location: [facility];  Service: Orthopedics;  Laterality: Right;  Marland Kitchen VAGINAL DELIVERY     x3   Family History:  Family History  Problem Relation Age of Onset  . Coronary artery disease Father   . Cancer Father        head neck   . Hypertension Mother   . Schizophrenia Mother   . Depression Brother   . Prostate cancer Brother   . Anesthesia problems Neg Hx   . Hypotension Neg Hx   . Malignant hyperthermia Neg Hx   . Pseudochol deficiency Neg Hx   . Allergic rhinitis Neg Hx   . Angioedema Neg Hx   . Asthma Neg Hx   . Atopy Neg Hx   . Eczema Neg Hx   . Immunodeficiency Neg Hx   . Urticaria Neg Hx    Family Psychiatric  History:  Social History:  Social History   Substance and Sexual Activity  Alcohol Use No  .  Alcohol/week: 0.0 standard drinks     Social History   Substance and Sexual Activity  Drug Use No    Social History   Socioeconomic History  . Marital status: Single    Spouse name: Not on file  . Number of children: 3  . Years of education: Not on file  . Highest education level: Some college, no degree  Occupational History  . Occupation: disabled/retired  Social Needs  . Financial resource strain: Somewhat hard  . Food insecurity:    Worry: Sometimes true    Inability: Sometimes true  . Transportation needs:    Medical: Yes    Non-medical: Yes  Tobacco Use  . Smoking status: Current Some Day Smoker    Packs/day: 0.10    Years: 30.00    Pack years: 3.00    Types: Cigarettes  . Smokeless tobacco: Never Used  . Tobacco comment: using nicotrol inhaler  Substance and Sexual Activity  . Alcohol use: No    Alcohol/week: 0.0 standard drinks  . Drug use: No  . Sexual activity: Never  Lifestyle  . Physical activity:    Days per  week: 0 days    Minutes per session: 0 min  . Stress: Rather much  Relationships  . Social connections:    Talks on phone: More than three times a week    Gets together: More than three times a week    Attends religious service: More than 4 times per year    Active member of club or organization: Yes    Attends meetings of clubs or organizations: More than 4 times per year    Relationship status: Never married  Other Topics Concern  . Not on file  Social History Narrative  . Not on file   Additional Social History:                         Sleep: Good  Appetite:  Fair  Current Medications: Current Outpatient Medications  Medication Sig Dispense Refill  . aspirin EC 81 MG EC tablet Take 1 tablet (81 mg total) by mouth daily. 30 tablet 0  . atorvastatin (LIPITOR) 10 MG tablet Take 10 mg by mouth at bedtime.     . budesonide-formoterol (SYMBICORT) 160-4.5 MCG/ACT inhaler Inhale 2 puffs into the lungs 2 (two) times daily.    .  cholecalciferol (VITAMIN D) 1000 UNITS tablet Take 1,000 Units by mouth daily.    . cyclobenzaprine (FLEXERIL) 5 MG tablet TAKE 2 TABLETS BY MOUTH 3 TIMES DAILY AS NEEDED  2  . dexlansoprazole (DEXILANT) 60 MG capsule Take 1 capsule (60 mg total) by mouth daily. (Patient not taking: Reported on 11/06/2017) 30 capsule 3  . Dexlansoprazole 30 MG capsule Take 30 mg by mouth daily.    . diazepam (VALIUM) 5 MG tablet 1 qam  2  qpm (Patient taking differently: Take 5-10 mg by mouth See admin instructions. Take 5mg  in the morning, and 10mg  in the evening) 90 tablet 0  . doxepin (SINEQUAN) 50 MG capsule 1  qhs (Patient taking differently: Take 50 mg by mouth at bedtime. ) 30 capsule 4  . fluticasone (FLONASE) 50 MCG/ACT nasal spray Place 1 spray into both nostrils daily.    . folic acid (FOLVITE) 1 MG tablet Take 1 mg by mouth daily.    . furosemide (LASIX) 20 MG tablet Take 20 mg by mouth.    . linaclotide (LINZESS) 145 MCG CAPS capsule Take 145 mcg by mouth daily as needed (costipation).     . magic mouthwash SOLN Take 5 mLs by mouth 3 (three) times daily as needed for mouth pain.    . methocarbamol (ROBAXIN) 750 MG tablet Take 1-2 tablets (750-1,500 mg total) by mouth 3 (three) times daily as needed for muscle spasms. 18 tablet 0  . metoCLOPramide (REGLAN) 10 MG tablet Take 1 tablet (10 mg total) by mouth every 6 (six) hours as needed for nausea (nausea/headache). 6 tablet 0  . Multiple Vitamins-Minerals (MULTIVITAMIN WITH MINERALS) tablet Take 1 tablet by mouth every morning.     . nortriptyline (PAMELOR) 25 MG capsule Take 2 capsules (50 mg total) by mouth at bedtime. 60 capsule 5  . oxyCODONE-acetaminophen (PERCOCET) 10-325 MG tablet Take 1 tablet by mouth 2 (two) times daily.    Marland Kitchen PAZEO 0.7 % SOLN PLACE 1 DROP INTO BOTH EYES ONCE DAILY as needed for allergies  3  . potassium chloride SA (K-DUR,KLOR-CON) 20 MEQ tablet Take 20 mEq by mouth 2 (two) times daily.     . QUEtiapine (SEROQUEL) 25 MG tablet  Take 1 tablet po bid and 1 prn  75 tablet 5  . triamterene-hydrochlorothiazide (DYAZIDE) 50-25 MG capsule Take 1 capsule by mouth daily.    . valACYclovir (VALTREX) 1000 MG tablet Take 1,000 mg by mouth daily.     . vitamin B-12 (CYANOCOBALAMIN) 1000 MCG tablet Take 1,000 mcg by mouth daily.    Marland Kitchen zolpidem (AMBIEN CR) 12.5 MG CR tablet Take 1 tablet (12.5 mg total) by mouth at bedtime as needed for sleep. (Patient not taking: Reported on 11/06/2017) 30 tablet 1   No current facility-administered medications for this visit.     Lab Results: No results found for this or any previous visit (from the past 48 hour(s)).  Physical Findings: AIMS:  , ,  ,  ,    CIWA:    COWS:     Musculoskeletal: Strength & Muscle Tone: within normal limits Gait & Station: normal Patient leans: N/A  Psychiatric Specialty Exam: ROS  Blood pressure 130/70, pulse 72, height 5\' 7"  (1.702 m), weight 237 lb (107.5 kg).Body mass index is 37.12 kg/m.  General Appearance: Casual  Eye Contact::  Good  Speech:  Clear and Coherent  Volume:  Normal  Mood:  Euthymic  Affect:  Congruent  Thought Process:  Coherent  Orientation:  Full (Time, Place, and Person)  Thought Content:  WDL  Suicidal Thoughts:  No  Homicidal Thoughts:  No  Memory:  NA  Judgement:  Good  Insight:  Fair  Psychomotor Activity:  Normal  Concentration:  Fair  Recall:  Good  Fund of Knowledge:Good  Language: Good  Akathisia:  No  Handed:  Right  AIMS (if indicated):     Assets:   ADL's:  Intact  Cognition: WNL  Sleep:       Treatment Plan  12/22/2017, 4:32 PM At this time the patient is #1 problem seems to be that of an adjustment disorder with anxious mood state. She clearly benefits by taking Valium past and I will restart this time. Therefore she'll continue on Valium 5 mg 1 in the morning and 2 at night problem #2 is clinical depression. Continue taking her antidepressant as prescribed. The patient also was in psychotherapy but  unfortunately her therapist is leaving. The patient has a started in therapy. Her third problem is that of sleep. Presently she takes doxepin it helps a great deal.The patient continue on her sleeping aid her antidepressant at her anxiety. Today we spent more than 50% of the time talking about interactions of her medicines and how to support her. She received counseling about importance of being in therapy this time she's treated for depression. Overall her symptomatology has worsened. It is moderate to severe. She is very stressed out. This patient to return to see me in 1-2 months.By that time she'll have been in psychotherapy for at least a few weeks. She will go ahead and restart her Valium and she'll share this with her other doctors. The patient is not suicidal. She is functioning fairly well.

## 2017-12-28 ENCOUNTER — Encounter: Payer: Self-pay | Admitting: Dietician

## 2017-12-28 ENCOUNTER — Encounter: Payer: Medicaid Other | Attending: Family Medicine | Admitting: Dietician

## 2017-12-28 DIAGNOSIS — E1169 Type 2 diabetes mellitus with other specified complication: Secondary | ICD-10-CM | POA: Diagnosis not present

## 2017-12-28 DIAGNOSIS — E119 Type 2 diabetes mellitus without complications: Secondary | ICD-10-CM

## 2017-12-28 DIAGNOSIS — Z713 Dietary counseling and surveillance: Secondary | ICD-10-CM | POA: Insufficient documentation

## 2017-12-28 DIAGNOSIS — Z6837 Body mass index (BMI) 37.0-37.9, adult: Secondary | ICD-10-CM | POA: Insufficient documentation

## 2017-12-28 NOTE — Progress Notes (Signed)
Patient was seen on 12/28/17 for the first of a series of three diabetes self-management courses at the [facility].  Patient Education Plan per assessed needs and concerns is to attend three course education program for Diabetes Self Management Education.  The following learning objectives were met by the patient during this class:  Describe diabetes  State some common risk factors for diabetes  Defines the role of glucose and insulin  Identifies type of diabetes and pathophysiology  Describe the relationship between diabetes and cardiovascular risk  State the members of the Healthcare Team  States the rationale for glucose monitoring  State when to test glucose  State their individual Target Range  State the importance of logging glucose readings  Describe how to interpret glucose readings  Identifies A1C target  Explain the correlation between A1c and eAG values  State symptoms and treatment of high blood glucose  State symptoms and treatment of low blood glucose  Explain proper technique for glucose testing  Identifies proper sharps disposal  Handouts given during class include:  ADA Diabetes You Take Control   Carb Counting and Meal Planning book  Meal Plan Card  Meal planning worksheet  Low Sodium Flavoring Tips  Types of Fats  The diabetes portion plate  J0L to eAG Conversion Chart  Diabetes Recommended Care Schedule  Support Group  Diabetes Success Plan  Core Class Satisfaction Survey   Follow-Up Plan:  Attend core 2

## 2017-12-29 ENCOUNTER — Ambulatory Visit (HOSPITAL_COMMUNITY): Payer: Self-pay | Admitting: Psychiatry

## 2018-01-04 ENCOUNTER — Encounter: Payer: Medicaid Other | Attending: Family Medicine | Admitting: Dietician

## 2018-01-04 ENCOUNTER — Encounter: Payer: Self-pay | Admitting: Dietician

## 2018-01-04 DIAGNOSIS — E1169 Type 2 diabetes mellitus with other specified complication: Secondary | ICD-10-CM | POA: Diagnosis not present

## 2018-01-04 DIAGNOSIS — Z713 Dietary counseling and surveillance: Secondary | ICD-10-CM | POA: Insufficient documentation

## 2018-01-04 DIAGNOSIS — E119 Type 2 diabetes mellitus without complications: Secondary | ICD-10-CM

## 2018-01-04 DIAGNOSIS — Z6837 Body mass index (BMI) 37.0-37.9, adult: Secondary | ICD-10-CM | POA: Diagnosis not present

## 2018-01-04 NOTE — Progress Notes (Signed)
Patient was seen on 01/04/18 for the second of a series of three diabetes self-management courses at the [facility]. The following learning objectives were met by the patient during this class:   Describe the role of different macronutrients on glucose  Explain how carbohydrates affect blood glucose  State what foods contain the most carbohydrates  Demonstrate carbohydrate counting  Demonstrate how to read Nutrition Facts food label  Describe effects of various fats on heart health  Describe the importance of good nutrition for health and healthy eating strategies  Describe techniques for managing your shopping, cooking and meal planning  List strategies to follow meal plan when dining out  Describe the effects of alcohol on glucose and how to use it safely  Goals:  Follow Diabetes Meal Plan as instructed  Aim to spread carbs evenly throughout the day  Aim for 3 meals per day and snacks as needed Include lean protein foods to meals/snacks  Monitor glucose levels as instructed by your doctor   Follow-Up Plan:  Attend Core 3  Work towards following your personal food plan.   

## 2018-01-11 ENCOUNTER — Encounter: Payer: Medicaid Other | Admitting: Dietician

## 2018-01-11 ENCOUNTER — Encounter: Payer: Self-pay | Admitting: Dietician

## 2018-01-11 DIAGNOSIS — Z713 Dietary counseling and surveillance: Secondary | ICD-10-CM | POA: Diagnosis not present

## 2018-01-11 DIAGNOSIS — E119 Type 2 diabetes mellitus without complications: Secondary | ICD-10-CM

## 2018-01-11 NOTE — Progress Notes (Signed)
Patient was seen on 01/11/18 for the third of a series of three diabetes self-management courses at the [facility].   Gloria Lewis the amount of activity recommended for healthy living . Describe activities suitable for individual needs . Identify ways to regularly incorporate activity into daily life . Identify barriers to activity and ways to over come these barriers  Identify diabetes medications being personally used and their primary action for lowering glucose and possible side effects . Describe role of stress on blood glucose and develop strategies to address psychosocial issues . Identify diabetes complications and ways to prevent them  Explain how to manage diabetes during illness . Evaluate success in meeting personal goal . Establish 2-3 goals that they will plan to diligently work on  Goals:   I will count my carb choices at most meals and snacks  I will be active 30 minutes or more 5 times a week  I will eat less unhealthy fats  I will look at patterns in my record book   To help manage stress I will  Talk to others at least 1 times a week  Your patient has identified these potential barriers to change:  Stress Lack of Family Support  Your patient has identified their diabetes self-care support plan as  On-line Resources  [facility] Support Group    Plan:  Attend Support Group as desired

## 2018-01-18 ENCOUNTER — Telehealth: Payer: Self-pay | Admitting: Internal Medicine

## 2018-01-18 NOTE — Telephone Encounter (Signed)
Patient has been scheduled for a consult for 01/20/18 with Dr. Bryan Lemma

## 2018-01-20 ENCOUNTER — Ambulatory Visit (INDEPENDENT_AMBULATORY_CARE_PROVIDER_SITE_OTHER): Payer: Medicaid Other | Admitting: Gastroenterology

## 2018-01-20 ENCOUNTER — Encounter: Payer: Self-pay | Admitting: Gastroenterology

## 2018-01-20 VITALS — BP 112/66 | HR 67 | Ht 67.0 in | Wt 236.4 lb

## 2018-01-20 DIAGNOSIS — E669 Obesity, unspecified: Secondary | ICD-10-CM | POA: Diagnosis not present

## 2018-01-20 DIAGNOSIS — K2270 Barrett's esophagus without dysplasia: Secondary | ICD-10-CM

## 2018-01-20 DIAGNOSIS — F17200 Nicotine dependence, unspecified, uncomplicated: Secondary | ICD-10-CM

## 2018-01-20 DIAGNOSIS — Z6837 Body mass index (BMI) 37.0-37.9, adult: Secondary | ICD-10-CM

## 2018-01-20 DIAGNOSIS — K219 Gastro-esophageal reflux disease without esophagitis: Secondary | ICD-10-CM | POA: Diagnosis not present

## 2018-01-20 NOTE — Patient Instructions (Addendum)
If you are age 61 or older, your body mass index should be between 23-30. Your Body mass index is 37.02 kg/m. If this is out of the aforementioned range listed, please consider follow up with your Primary Care Provider.  If you are age 58 or younger, your body mass index should be between 19-25. Your Body mass index is 37.02 kg/m. If this is out of the aformentioned range listed, please consider follow up with your Primary Care Provider.   Call us to schedule your 3-6 month follow-up in November 2019.  It was a pleasure to see you today!  Vito Cirigliano, D.O.

## 2018-01-20 NOTE — Progress Notes (Signed)
Chief Complaint: GERD   Referring Provider: Self   HPI:     Gloria Lewis is a 62 y.o. female presenting to the Gastroenterology Clinic for evaluation of GERD with specific questions regarding transoral incision less fundoplication (TIF).  Most recently, she has followed with Dr. Alan Ripper at [facility]. She was previously seen by Dr. Carlean Purl and Alonza Bogus in 12/2016 for reflux.  Previous to that, she followed with Dr. Amedeo Plenty at [place].  Reflux history: -Index symptoms include: Heartburn, regurgitation -Previous treatment includes Zantac 150 mg twice daily, pantoprazole 40 mg p.o. twice daily, both with incomplete response. Changed to Dexilant 60 mg daily in January 2018 with control of symptoms.  Reduced to Dexilant 30 mg/day in May by her GI at [place] center.  Symptoms return when out of medications.  - Last EGD May 2019 at [facility].  Patient states notable for Barrett's esophagus.  No records for review in EMR.  Placed records request today. - No prior pH/impedance testing - No previous upper GI series/esophagram  Today she states she has had continued good control of reflux symptoms since reducing Dexilant to 30 mg/day in May from her GI at [facility]. Improvement also in conjunction with dietary modifications. Sleeps with HOB elevated on a few pillows, which she has done for years. Occasional oral pharyngeal phase dysphagia with liquids; not with liquids. No prior hx of food impaction.  No nausea or vomiting.  Current BMI 37. States has intentionally lost 25# since May (was 267) with diet and exercise.   Oxycodone for right shoulder, back and left knee pain. Follows with Pain clinic at [facility].   Endoscopic history: - EGD (08/2017; [facility]): Patient reports Barrett's Esophagus. Sent request for this report today -Colonoscopy (07/2013, Dr. Amedeo Plenty): Internal hemorrhoids.  Recommended to repeat in  2020 due to prior history of tubular adenomas -Colonoscopy (07/2008, Dr. Amedeo Plenty): 2 small polyps, 4 to 5 mm, tubular adenomas with recommendation to repeat in 5 years -EGD (07/2008, Dr. Amedeo Plenty): Normal esophagus with empiric dilation with 18 mm savory dilator due to complaint of dysphagia at the time  No recent imaging for review.  Recent labs notable for normal CBC and CMP in August.   Past Medical History:  Diagnosis Date  . Anginal pain ([place])    admit 06/2014; had non-ischemic stress test  . Anxiety   . Bipolar 1 disorder ([place])   . Colon polyp   . CTS (carpal tunnel syndrome)   . Depression   . Diabetes mellitus without complication ([place])   . Fever blister   . GERD (gastroesophageal reflux disease)   . HA (headache)   . HTN (hypertension)   . Hypercholesterolemia   . Migraines   . OA (osteoarthritis)   . Schizo-affective psychosis ([place])      Past Surgical History:  Procedure Laterality Date  . ANTERIOR CERVICAL DECOMP/DISCECTOMY FUSION  08/27/2011   Procedure: ANTERIOR CERVICAL DECOMPRESSION/DISCECTOMY FUSION 1 LEVEL/HARDWARE REMOVAL;  Surgeon: Eustace Moore, MD;  Location: [place] [facility];  Service: Neurosurgery;  Laterality: Bilateral;  Cervical four-five Anterior cervical decompression/diskectomy, fusion, Plate, Removal of Cervical five-seven Plate  . back injection    . CARDIAC CATHETERIZATION N/A 11/09/2014   Procedure: Right Heart Cath;  Surgeon: Larey Dresser, MD;  Location: [place] [facility];  Service: Cardiovascular;  Laterality: N/A;  . [place] RELEASE  20110 rt/lt  rt x2 , lt x1  . COLONOSCOPY  2019   [facility]  . ESOPHAGOGASTRODUODENOSCOPY  2019   [facility]  . HEMORRHOID SURGERY    . MULTIPLE TOOTH EXTRACTIONS    . NECK SURGERY  2009  . PITUITARY SURGERY     Had gland removed from producing too much calcium  . polp removed  2011  . RIGHT/LEFT HEART CATH AND CORONARY ANGIOGRAPHY N/A 07/05/2017   Procedure: RIGHT/LEFT HEART CATH AND  CORONARY ANGIOGRAPHY;  Surgeon: Larey Dresser, MD;  Location: [place] [facility];  Service: Cardiovascular;  Laterality: N/A;  . SHOULDER ARTHROSCOPY WITH ROTATOR CUFF REPAIR Right 05/23/2015   Procedure: RIGHT SHOULDER ARTHROSCOPY WITH REMOVAL OF SUTURE ANCHOR AND POSSIBLE REVISION ROTATOR CUFF REPAIR;  Surgeon: Justice Britain, MD;  Location: [place];  Service: Orthopedics;  Laterality: Right;  . SHOULDER ARTHROSCOPY WITH SUBACROMIAL DECOMPRESSION Right 01/24/2015   Procedure: RIGHT SHOULDER ARTHROSCOPY WITH SUBACROMIAL DECOMPRESSION AD DISTAL CLAVICLE RESECTION ;  Surgeon: Justice Britain, MD;  Location: [place];  Service: Orthopedics;  Laterality: Right;  Marland Kitchen VAGINAL DELIVERY     x3   Family History  Problem Relation Age of Onset  . Coronary artery disease Father   . Cancer Father        head neck   . Esophageal cancer Father   . Hypertension Mother   . Schizophrenia Mother   . Depression Brother   . Prostate cancer Brother   . Anesthesia problems Neg Hx   . Hypotension Neg Hx   . Malignant hyperthermia Neg Hx   . Pseudochol deficiency Neg Hx   . Allergic rhinitis Neg Hx   . Angioedema Neg Hx   . Asthma Neg Hx   . Atopy Neg Hx   . Eczema Neg Hx   . Immunodeficiency Neg Hx   . Urticaria Neg Hx    Social History   Tobacco Use  . Smoking status: Current Some Day Smoker    Packs/day: 0.10    Years: 30.00    Pack years: 3.00    Types: Cigarettes  . Smokeless tobacco: Never Used  . Tobacco comment: using nicotrol inhaler  Substance Use Topics  . Alcohol use: No    Alcohol/week: 0.0 standard drinks  . Drug use: No   Current Outpatient Medications  Medication Sig Dispense Refill  . aspirin EC 81 MG EC tablet Take 1 tablet (81 mg total) by mouth daily. 30 tablet 0  . atorvastatin (LIPITOR) 10 MG tablet Take 10 mg by mouth at bedtime.     . budesonide-formoterol (SYMBICORT) 160-4.5 MCG/ACT inhaler Inhale 2 puffs into the lungs 2 (two) times daily.    . cholecalciferol (VITAMIN D)  1000 UNITS tablet Take 1,000 Units by mouth daily.    . cyclobenzaprine (FLEXERIL) 5 MG tablet TAKE 2 TABLETS BY MOUTH 3 TIMES DAILY AS NEEDED  2  . dexlansoprazole (DEXILANT) 60 MG capsule Take 1 capsule (60 mg total) by mouth daily. 30 capsule 3  . Dexlansoprazole 30 MG capsule Take 30 mg by mouth daily.    . diazepam (VALIUM) 5 MG tablet Take 1-2 tablets (5-10 mg total) by mouth See admin instructions. Take 5mg  in the morning, and 10mg  in the evening 90 tablet 1  . doxepin (SINEQUAN) 50 MG capsule 1  qhs (Patient taking differently: Take 50 mg by mouth at bedtime. ) 30 capsule 4  . fluticasone (FLONASE) 50 MCG/ACT nasal spray Place 1 spray into both nostrils daily.    Marland Kitchen  folic acid (FOLVITE) 1 MG tablet Take 1 mg by mouth daily.    . furosemide (LASIX) 20 MG tablet Take 20 mg by mouth.    . linaclotide (LINZESS) 145 MCG CAPS capsule Take 145 mcg by mouth daily as needed (costipation).     . magic mouthwash SOLN Take 5 mLs by mouth 3 (three) times daily as needed for mouth pain.    . methocarbamol (ROBAXIN) 750 MG tablet Take 1-2 tablets (750-1,500 mg total) by mouth 3 (three) times daily as needed for muscle spasms. 18 tablet 0  . metoCLOPramide (REGLAN) 10 MG tablet Take 1 tablet (10 mg total) by mouth every 6 (six) hours as needed for nausea (nausea/headache). 6 tablet 0  . Multiple Vitamins-Minerals (MULTIVITAMIN WITH MINERALS) tablet Take 1 tablet by mouth every morning.     . nortriptyline (PAMELOR) 25 MG capsule Take 2 capsules (50 mg total) by mouth at bedtime. 60 capsule 5  . oxyCODONE-acetaminophen (PERCOCET) 10-325 MG tablet Take 1 tablet by mouth 2 (two) times daily.    Marland Kitchen PAZEO 0.7 % SOLN PLACE 1 DROP INTO BOTH EYES ONCE DAILY as needed for allergies  3  . potassium chloride SA (K-DUR,KLOR-CON) 20 MEQ tablet Take 20 mEq by mouth 2 (two) times daily.     . QUEtiapine (SEROQUEL) 25 MG tablet Take 1 tablet po bid and 1 prn (Patient taking differently: as needed. Take 1 tablet po bid and 1  prn) 75 tablet 5  . triamterene-hydrochlorothiazide (DYAZIDE) 50-25 MG capsule Take 1 capsule by mouth daily.    . valACYclovir (VALTREX) 1000 MG tablet Take 1,000 mg by mouth daily.     . vitamin B-12 (CYANOCOBALAMIN) 1000 MCG tablet Take 1,000 mcg by mouth daily.    Marland Kitchen zolpidem (AMBIEN CR) 12.5 MG CR tablet Take 1 tablet (12.5 mg total) by mouth at bedtime as needed for sleep. (Patient not taking: Reported on 11/06/2017) 30 tablet 1   No current facility-administered medications for this visit.    Allergies  Allergen Reactions  . Effexor [Venlafaxine Hydrochloride] Itching and Other (See Comments)    headache  . Latex Itching and Rash  . Penicillins Hives    Has patient had a PCN reaction causing immediate rash, facial/tongue/throat swelling, SOB or lightheadedness with hypotension: Yes Has patient had a PCN reaction causing severe rash involving mucus membranes or skin necrosis: No Has patient had a PCN reaction that required hospitalization No Has patient had a PCN reaction occurring within the last 10 years: No If all of the above answers are "NO", then may proceed with Cephalosporin use.   . Zithromax [Azithromycin Dihydrate] Swelling  . Amlodipine Swelling  . Atorvastatin Swelling  . Butrans [Buprenorphine] Other (See Comments)    Ulcers-"mouth would not heal"  . Codeine Itching    Tolerable with benadryl  . Nucynta [Tapentadol] Other (See Comments)    Ulcers inside of mouth  . Vicodin [Hydrocodone-Acetaminophen] Itching  . Chantix [Varenicline Tartrate] Nausea Only  . Paroxetine Hcl Other (See Comments)    headache  . Tramadol Other (See Comments)    Pt states it interacted with her sertraline, but she is no longer on sertraline.  She does not remember the type of reaction she had.      Review of Systems: All systems reviewed and negative except where noted in HPI.     Physical Exam:    Wt Readings from Last 3 Encounters:  01/20/18 236 lb 6 oz (107.2 kg)  12/28/17  239 lb (  108.4 kg)  08/11/17 234 lb (106.1 kg)    BP 112/66   Pulse 67   Ht 5\' 7"  (1.702 m)   Wt 236 lb 6 oz (107.2 kg)   BMI 37.02 kg/m  Constitutional:  Pleasant, in no acute distress. Psychiatric: Normal mood and affect. Behavior is normal. EENT: Pupils normal.  Conjunctivae are normal. No scleral icterus. Neck supple. No cervical LAD. Cardiovascular: Normal rate, regular rhythm. No edema Pulmonary/chest: Effort normal and breath sounds normal. No wheezing, rales or rhonchi. Abdominal: Soft, nondistended, nontender. Bowel sounds active throughout. There are no masses palpable. No hepatomegaly. Neurological: Alert and oriented to person place and time. Skin: Skin is warm and dry. No rashes noted.   ASSESSMENT AND PLAN;   THERISA MENNELLA is a 62 y.o. female presenting with:   1) GERD: Long-standing history of reflux which is now well controlled on Dexilant 30 mg daily.  Still with dietary and lifestyle modifications to keep good control of reflux.  We discussed TIF at length, to include appropriate indications and contraindications of the procedure and will proceed as below:  - Request records of recent EGD. Need to check extent/severity of BE and presence or absence of hiatal hernia - If without histologic evidence of Barrett's esophagus, will need additional evaluation to secure objective evidence of reflux as part of her preoperative evaluation -Of note, current BMI 37 after intentionally losing 25 pounds.  Would need to lose approximately 25 pounds more to get BMI <35 to meet criteria. Otherwise, we discussed whether or not to be more beneficial to refer to bariatric surgeon for consideration of RYGB - Continue current medical management of reflux -Continue antireflux lifestyle measures  2) Barrett's esophagus: We will request records as above to evaluate for extent of Barrett's.  Was previous he recommended a repeat endoscopy in 1 year by her primary gastroenterologist.  3)  obesity: Has intentionally lost 25 pounds through diet/exercise.  Applauded her weight loss efforts and encouraged her to continue to lose weight not only for overall health benefits but also to increase her antireflux surgical options  4) tobacco use disorder: Stopped smoking 3 days ago.  Again applauded smoking cessation as a great overall health benefit and discussed cessation options to continue this.   RTC in 3-6 months or sooner prn  I spent a total of 45 minutes of face-to-face time with the patient. Greater than 50% of the time was spent counseling and coordinating care.    Lavena Bullion, DO, FACG  01/20/2018, 9:38 AM   Harlan Stains, MD

## 2018-01-24 ENCOUNTER — Telehealth: Payer: Self-pay | Admitting: Gastroenterology

## 2018-01-24 NOTE — Telephone Encounter (Signed)
Received fax from [facility] [facility] regarding EGD from 07/2017 which was notable for irregular Z line with biopsies demonstrating short segment nondysplastic Barrett's esophagus.  EGD also notable for small hiatal hernia (size not measured on this report), non-H. pylori gastritis, and a normal duodenum with normal duodenal biopsies.  Was given recommendation repeat in 07/2018 due to initial diagnosis of Barrett's esophagus.  Scanned copy of this to media tab.

## 2018-02-08 ENCOUNTER — Other Ambulatory Visit (HOSPITAL_COMMUNITY): Payer: Self-pay | Admitting: Psychiatry

## 2018-02-10 ENCOUNTER — Ambulatory Visit (HOSPITAL_COMMUNITY): Payer: Medicaid Other | Admitting: Psychiatry

## 2018-02-10 ENCOUNTER — Ambulatory Visit (INDEPENDENT_AMBULATORY_CARE_PROVIDER_SITE_OTHER): Payer: Medicaid Other | Admitting: Psychiatry

## 2018-02-10 ENCOUNTER — Encounter (HOSPITAL_COMMUNITY): Payer: Self-pay | Admitting: Psychiatry

## 2018-02-10 VITALS — BP 117/80 | HR 84 | Ht 67.0 in | Wt 234.0 lb

## 2018-02-10 DIAGNOSIS — F324 Major depressive disorder, single episode, in partial remission: Secondary | ICD-10-CM

## 2018-02-10 MED ORDER — BUPROPION HCL ER (XL) 150 MG PO TB24: 150.0000 mg | ORAL_TABLET | Freq: Every day | ORAL | 3 refills | Status: DC

## 2018-02-10 MED ORDER — DIAZEPAM 5 MG PO TABS: 5.0000 mg | ORAL_TABLET | ORAL | 1 refills | Status: DC

## 2018-02-10 NOTE — Progress Notes (Signed)
Patient ID: Gloria Lewis, female   DOB: March 23, 1956, 62 y.o.   MRN: [id number] [facility] MD Progress Note  02/10/2018 4:51 PM Gloria Lewis  MRN:  [id number] Subjective:  Shoulder hurting Principal Problem: Major Depression,recurent Mild Diagnosis: Major Depression, Recurent  Today the patient is doing better.  He.  He denies to room place she can have her dog there.  She is very happy.  She also is learning how to separate herself from her to out-of-control sons.  One is met all the time he just is out of the [employer].  The other one is chronically severely mentally ill is being followed by an act team.  Unfortunately the patient is starting to smoke cigarettes again.  She is bothered by this a lot.  She takes her Valium just as I prescribed 5 mg 1 in the morning and 2 at night.  She rarely takes any other substances like opiates.  Patient now is in therapy with Caren Griffins at the [facility].  The patient is doing well with her appetite.  She is actually lost about 20 pounds.  She is a good mood state.  Patient denies anxiety.  The patient denies use of alcohol or any drugs.  She has no evidence of psychosis.  Her diabetes is doing a bit better.  Her hemoglobin A1c is down to 6.3.  The patient shows no signs of irritability.  She shows no signs of mania.  She is actually functioning very well.  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  In essence the patient has housing she is working and she plans to go back to school.  She is positive and optimistic.  I seen her like this before so we will just have to hold tight. Patient Active Problem List   Diagnosis Date Noted  . Infectious gastroenteritis [A09] 04/16/2016  . Xerostomia [R68.2] 03/09/2016  . Surgery, elective [Z41.9] 05/23/2015  . S/P arthroscopy of shoulder [Z98.890] 05/23/2015  . Chronic migraine without aura without status migrainosus, not intractable [G43.709] 10/18/2014  . Tobacco abuse  [Z72.0] 10/18/2014  . Obesity [E66.9] 09/23/2014  . COPD [J44.9] 09/02/2014  . Pulmonary hypertension ([place]) [I27.20] 08/31/2014  . Respiratory failure with hypoxia ([place]) [J96.91] 08/14/2014  . Cigarette smoker [F17.210] 07/28/2014  . Major depressive disorder, recurrent episode, moderate ([place]) [F33.1] 07/06/2014  . Essential hypertension [I10]   . SOB (shortness of breath) [R06.02] 06/21/2014  . Precordial pain [R07.2] 06/21/2014  . Gastroesophageal reflux disease [K21.9] 06/21/2014  . Chest pain [R07.9] 06/21/2014  . HTN (hypertension) [I10]   . Neck pain [M54.2] 01/08/2014  . Major depressive disorder, recurrent episode, severe, without mention of psychotic behavior [F33.2] 10/14/2012  . Schizoaffective disorder ([place]) [F25.9] 05/26/2012  . 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 Hyperparathyroidism, primary ([place]) [E21.0] 10/06/2010  . DEGENERATIVE DISC DISEASE, LUMBOSACRAL SPINE [M51.37] 05/21/2007  . DERMATOPHYTOSIS OF THE BODY [B35.4] 05/10/2007  . 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 Anxiety state [F41.1] 03/24/2007  . DENTAL PAIN [K08.9] 03/24/2007  . SHOULDER PAIN, LEFT [M25.519] 03/24/2007   Total Time spent with patient:30 min  Past Psychiatric History:   Past Medical History:  Past Medical History:  Diagnosis Date  . Anginal pain ([place])    admit 06/2014; had non-ischemic stress test  . Anxiety   . Bipolar 1 disorder ([place])   . Colon polyp   . CTS (carpal tunnel syndrome)   . Depression   .  Diabetes mellitus without complication ([place])   . Fever blister   . GERD (gastroesophageal reflux disease)   . HA (headache)   . HTN (hypertension)   . Hypercholesterolemia   . Migraines   . OA (osteoarthritis)   . Schizo-affective psychosis ([place])     Past Surgical History:  Procedure Laterality Date  . ANTERIOR CERVICAL DECOMP/DISCECTOMY FUSION  08/27/2011   Procedure: ANTERIOR CERVICAL DECOMPRESSION/DISCECTOMY FUSION 1 LEVEL/HARDWARE REMOVAL;  Surgeon:  Eustace Moore, MD;  Location: [facility];  Service: Neurosurgery;  Laterality: Bilateral;  Cervical four-five Anterior cervical decompression/diskectomy, fusion, Plate, Removal of Cervical five-seven Plate  . back injection    . CARDIAC CATHETERIZATION N/A 11/09/2014   Procedure: Right Heart Cath;  Surgeon: Larey Dresser, MD;  Location: [place];  Service: Cardiovascular;  Laterality: N/A;  . CARPAL TUNNEL RELEASE  20110 rt/lt   rt x2 , lt x1  . COLONOSCOPY  06/2017   [facility]  . ESOPHAGOGASTRODUODENOSCOPY  06/2017   [facility]  . HEMORRHOID SURGERY    . MULTIPLE TOOTH EXTRACTIONS    . NECK SURGERY  2009  . PITUITARY SURGERY     Had gland removed from producing too much calcium  . polp removed  2011  . RIGHT/LEFT HEART CATH AND CORONARY ANGIOGRAPHY N/A 07/05/2017   Procedure: RIGHT/LEFT HEART CATH AND CORONARY ANGIOGRAPHY;  Surgeon: Larey Dresser, MD;  Location: [place] [facility];  Service: Cardiovascular;  Laterality: N/A;  . SHOULDER ARTHROSCOPY WITH ROTATOR CUFF REPAIR Right 05/23/2015   Procedure: RIGHT SHOULDER ARTHROSCOPY WITH REMOVAL OF SUTURE ANCHOR AND POSSIBLE REVISION ROTATOR CUFF REPAIR;  Surgeon: Justice Britain, MD;  Location: [facility];  Service: Orthopedics;  Laterality: Right;  . SHOULDER ARTHROSCOPY WITH SUBACROMIAL DECOMPRESSION Right 01/24/2015   Procedure: RIGHT SHOULDER ARTHROSCOPY WITH SUBACROMIAL DECOMPRESSION AD DISTAL CLAVICLE RESECTION ;  Surgeon: Justice Britain, MD;  Location: [facility];  Service: Orthopedics;  Laterality: Right;  Marland Kitchen VAGINAL DELIVERY     x3   Family History:  Family History  Problem Relation Age of Onset  . Coronary artery disease Father   . Cancer Father        head neck   . Esophageal cancer Father   . Hypertension Mother   . Schizophrenia Mother   . Depression Brother   . Prostate cancer Brother   . Anesthesia problems Neg Hx   . Hypotension Neg Hx   . Malignant hyperthermia Neg Hx   . Pseudochol  deficiency Neg Hx   . Allergic rhinitis Neg Hx   . Angioedema Neg Hx   . Asthma Neg Hx   . Atopy Neg Hx   . Eczema Neg Hx   . Immunodeficiency Neg Hx   . Urticaria Neg Hx    Family Psychiatric  History:  Social History:  Social History   Substance and Sexual Activity  Alcohol Use No  . Alcohol/week: 0.0 standard drinks     Social History   Substance and Sexual Activity  Drug Use No    Social History   Socioeconomic History  . Marital status: Single    Spouse name: Not on file  . Number of children: 3  . Years of education: Not on file  . Highest education level: Some college, no degree  Occupational History  . Occupation: disabled/retired  Social Needs  . Financial resource strain: Somewhat hard  . Food insecurity:    Worry: Sometimes true    Inability: Sometimes true  .  Transportation needs:    Medical: Yes    Non-medical: Yes  Tobacco Use  . Smoking status: Current Some Day Smoker    Packs/day: 0.10    Years: 30.00    Pack years: 3.00    Types: Cigarettes  . Smokeless tobacco: Never Used  . Tobacco comment: using nicotrol inhaler  Substance and Sexual Activity  . Alcohol use: No    Alcohol/week: 0.0 standard drinks  . Drug use: No  . Sexual activity: Never  Lifestyle  . Physical activity:    Days per week: 0 days    Minutes per session: 0 min  . Stress: Rather much  Relationships  . Social connections:    Talks on phone: More than three times a week    Gets together: More than three times a week    Attends religious service: More than 4 times per year    Active member of club or organization: Yes    Attends meetings of clubs or organizations: More than 4 times per year    Relationship status: Never married  Other Topics Concern  . Not on file  Social History Narrative  . Not on file   Additional Social History:                         Sleep: Good  Appetite:  Fair  Current Medications: Current Outpatient Medications   Medication Sig Dispense Refill  . aspirin EC 81 MG EC tablet Take 1 tablet (81 mg total) by mouth daily. 30 tablet 0  . atorvastatin (LIPITOR) 10 MG tablet Take 10 mg by mouth at bedtime.     . budesonide-formoterol (SYMBICORT) 160-4.5 MCG/ACT inhaler Inhale 2 puffs into the lungs 2 (two) times daily.    . cholecalciferol (VITAMIN D) 1000 UNITS tablet Take 1,000 Units by mouth daily.    . cyclobenzaprine (FLEXERIL) 5 MG tablet TAKE 2 TABLETS BY MOUTH 3 TIMES DAILY AS NEEDED  2  . dexlansoprazole (DEXILANT) 60 MG capsule Take 1 capsule (60 mg total) by mouth daily. 30 capsule 3  . Dexlansoprazole 30 MG capsule Take 30 mg by mouth daily.    . diazepam (VALIUM) 5 MG tablet Take 1-2 tablets (5-10 mg total) by mouth See admin instructions. Take 45m in the morning, and 128min the evening 90 tablet 1  . doxepin (SINEQUAN) 50 MG capsule 1  qhs (Patient taking differently: Take 50 mg by mouth at bedtime. ) 30 capsule 4  . fluticasone (FLONASE) 50 MCG/ACT nasal spray Place 1 spray into both nostrils daily.    . folic acid (FOLVITE) 1 MG tablet Take 1 mg by mouth daily.    . furosemide (LASIX) 20 MG tablet Take 20 mg by mouth.    . linaclotide (LINZESS) 145 MCG CAPS capsule Take 145 mcg by mouth daily as needed (costipation).     . magic mouthwash SOLN Take 5 mLs by mouth 3 (three) times daily as needed for mouth pain.    . methocarbamol (ROBAXIN) 750 MG tablet Take 1-2 tablets (750-1,500 mg total) by mouth 3 (three) times daily as needed for muscle spasms. 18 tablet 0  . metoCLOPramide (REGLAN) 10 MG tablet Take 1 tablet (10 mg total) by mouth every 6 (six) hours as needed for nausea (nausea/headache). 6 tablet 0  . Multiple Vitamins-Minerals (MULTIVITAMIN WITH MINERALS) tablet Take 1 tablet by mouth every morning.     . nortriptyline (PAMELOR) 25 MG capsule Take 2 capsules (50 mg  total) by mouth at bedtime. 60 capsule 5  . oxyCODONE-acetaminophen (PERCOCET) 10-325 MG tablet Take 1 tablet by mouth 2  (two) times daily.    Marland Kitchen PAZEO 0.7 % SOLN PLACE 1 DROP INTO BOTH EYES ONCE DAILY as needed for allergies  3  . potassium chloride SA (K-DUR,KLOR-CON) 20 MEQ tablet Take 20 mEq by mouth 2 (two) times daily.     . QUEtiapine (SEROQUEL) 25 MG tablet Take 1 tablet po bid and 1 prn (Patient taking differently: as needed. Take 1 tablet po bid and 1 prn) 75 tablet 5  . triamterene-hydrochlorothiazide (DYAZIDE) 50-25 MG capsule Take 1 capsule by mouth daily.    . valACYclovir (VALTREX) 1000 MG tablet Take 1,000 mg by mouth daily.     . vitamin B-12 (CYANOCOBALAMIN) 1000 MCG tablet Take 1,000 mcg by mouth daily.    Marland Kitchen buPROPion (WELLBUTRIN XL) 150 MG 24 hr tablet Take 1 tablet (150 mg total) by mouth daily. 30 tablet 3   No current facility-administered medications for this visit.     Lab Results: No results found for this or any previous visit (from the past 48 hour(s)).  Physical Findings: AIMS:  , ,  ,  ,    CIWA:    COWS:     Musculoskeletal: Strength & Muscle Tone: within normal limits Gait & Station: normal Patient leans: N/A  Psychiatric Specialty Exam: ROS  Blood pressure 117/80, pulse 84, height '5\' 7"'  (1.702 m), weight 234 lb (106.1 kg), SpO2 94 %.Body mass index is 36.65 kg/m.  General Appearance: Casual  Eye Contact::  Good  Speech:  Clear and Coherent  Volume:  Normal  Mood:  Euthymic  Affect:  Congruent  Thought Process:  Coherent  Orientation:  Full (Time, Place, and Person)  Thought Content:  WDL  Suicidal Thoughts:  No  Homicidal Thoughts:  No  Memory:  NA  Judgement:  Good  Insight:  Fair  Psychomotor Activity:  Normal  Concentration:  Fair  Recall:  Good  Fund of Knowledge:Good  Language: Good  Akathisia:  No  Handed:  Right  AIMS (if indicated):     Assets:   ADL's:  Intact  Cognition: WNL  Sleep:       Treatment Plan  02/10/2018, 4:51 PM This patient's first problem is an adjustment disorder with an anxious mood state.  At this time it is reasonable  that she is in therapy that we give her some Valium 5 mg 1 in the morning and 2 at night.  This does seem to stabilize her.  Her second problem is that tobacco use.  At this time the patient will begin on Wellbutrin 150 mg XL.  I will start off slow with this individual.  She definitely smokes a few cigarettes a day and I do not think is that big of an issue.  She cannot tolerate Chantix.  I do not think this is indicated anyway but I think taking Wellbutrin psychologically will help her.  Patient is functioning much better.  The best thing is that she is in therapy.  She looks well she is engaging and friendly.  She is not short of breath denies any chest pain and denies any neurological symptoms at this time.  Her goal is to support her as she is in therapy and to take a mild dose of Valium.  At this time I would say this patient is improved and is mild to moderate in terms of her psychopathology.Marland Kitchen

## 2018-02-16 ENCOUNTER — Ambulatory Visit (HOSPITAL_COMMUNITY): Payer: Self-pay | Admitting: Psychiatry

## 2018-02-22 ENCOUNTER — Other Ambulatory Visit: Payer: Self-pay | Admitting: Family Medicine

## 2018-02-22 DIAGNOSIS — Z1231 Encounter for screening mammogram for malignant neoplasm of breast: Secondary | ICD-10-CM

## 2018-03-29 MED FILL — PROMETHAZINE W/DM SYRUP: 6.25-15 | 24 days supply | Qty: 118 | Fill #0

## 2018-04-07 ENCOUNTER — Ambulatory Visit
Admission: RE | Admit: 2018-04-07 | Discharge: 2018-04-07 | Disposition: A | Discharge status: Home / Self Care | Payer: Medicaid Other | Source: Ambulatory Visit | Attending: Family Medicine | Admitting: Family Medicine

## 2018-04-07 DIAGNOSIS — Z1231 Encounter for screening mammogram for malignant neoplasm of breast: Secondary | ICD-10-CM

## 2018-05-05 ENCOUNTER — Other Ambulatory Visit (HOSPITAL_COMMUNITY): Payer: Self-pay | Admitting: Psychiatry

## 2018-05-06 ENCOUNTER — Other Ambulatory Visit (HOSPITAL_COMMUNITY): Payer: Self-pay | Admitting: Psychiatry

## 2018-05-13 ENCOUNTER — Ambulatory Visit (INDEPENDENT_AMBULATORY_CARE_PROVIDER_SITE_OTHER): Payer: Medicaid Other | Admitting: Psychiatry

## 2018-05-13 ENCOUNTER — Encounter (HOSPITAL_COMMUNITY): Payer: Self-pay | Admitting: Psychiatry

## 2018-05-13 VITALS — BP 148/85 | HR 81 | Ht 67.0 in | Wt 236.0 lb

## 2018-05-13 DIAGNOSIS — F4323 Adjustment disorder with mixed anxiety and depressed mood: Secondary | ICD-10-CM | POA: Diagnosis not present

## 2018-05-13 MED ORDER — DIAZEPAM 2 MG PO TABS: ORAL_TABLET | ORAL | 0 refills | Status: DC

## 2018-05-13 MED ORDER — DOXEPIN HCL 50 MG PO CAPS: ORAL_CAPSULE | ORAL | 5 refills | Status: DC

## 2018-05-13 NOTE — Progress Notes (Signed)
Patient ID: Gloria Lewis, female   DOB: [birth year], 63 y.o.   MRN: [id number] [facility] MD Progress Note  05/13/2018 9:09 AM LARESA OSHIRO  MRN:  [id number] Subjective:  Shoulder hurting Principal Problem: Major Depression,recurent Mild Diagnosis: Adjustment disorder with anxiety depression  Patient is doing very well at this time.  She has 2 sons one his name is Gloria Lewis who is mentally ill who is disabled and has his own place and seems to be quite stable.  Her other son's name is Gloria Lewis has just left her premises as the patient has moved to a new apartment on her own.  She likes her new place a great deal.  She feels safe and secure.  The patient is restarting in many ways.  The patient continues to work part-time.  She still sees Dr. Harlan Stains for her medical care.  Her last hemoglobin A1c according to her notes was 6.3.  The patient is actually decided with conjunction with her primary care doctor to hold off any further treatment for her blood pressure is well controlled at this time.  She has lost weight and I think this is been the cause.  The patient does not smoke anymore.  The patient denies daily depression.  She is sleeping and eating very well.  She can concentrate without problem.  She is a good sense of work.  She is not suicidal.  She has no psychotic symptoms at all.  She denies the persistent use of alcohol or drugs.  Her plans are to get back to go for college and take 3 more classes to get her degree.  Patient is very stable at this time.  She is just had a great grandchild.  Her pain doctor gives her OxyContin for her shoulder and back pain.  He is a bit concerned that she was receiving Valium for me on her last visit.  This was only done on a transient basis while she was going to a great deal of stress.  Her plans are to taper and discontinue.  The patient stays active.  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  She also loves to read.   Patient  Active Problem List   Diagnosis Date Noted  . Infectious gastroenteritis [A09] 04/16/2016  . Xerostomia [R68.2] 03/09/2016  . Surgery, elective [Z41.9] 05/23/2015  . S/P arthroscopy of shoulder [Z98.890] 05/23/2015  . Chronic migraine without aura without status migrainosus, not intractable [G43.709] 10/18/2014  . Tobacco abuse [Z72.0] 10/18/2014  . Obesity [E66.9] 09/23/2014  . COPD [J44.9] 09/02/2014  . Pulmonary hypertension ([place]) [I27.20] 08/31/2014  . Respiratory failure with hypoxia ([place]) [J96.91] 08/14/2014  . Cigarette smoker [F17.210] 07/28/2014  . Major depressive disorder, recurrent episode, moderate ([place]) [F33.1] 07/06/2014  . Essential hypertension [I10]   . SOB (shortness of breath) [R06.02] 06/21/2014  . Precordial pain [R07.2] 06/21/2014  . Gastroesophageal reflux disease [K21.9] 06/21/2014  . Chest pain [R07.9] 06/21/2014  . HTN (hypertension) [I10]   . Neck pain [M54.2] 01/08/2014  . Major depressive disorder, recurrent episode, severe, without mention of psychotic behavior [F33.2] 10/14/2012  . Schizoaffective disorder ([place]) [F25.9] 05/26/2012  . 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 Hyperparathyroidism, primary ([place]) [E21.0] 10/06/2010  . DEGENERATIVE DISC DISEASE, LUMBOSACRAL SPINE [M51.37] 05/21/2007  . DERMATOPHYTOSIS OF THE BODY [B35.4] 05/10/2007  . 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 Anxiety state [F41.1] 03/24/2007  . DENTAL PAIN [K08.9] 03/24/2007  . SHOULDER PAIN,  LEFT [M25.519] 03/24/2007   Total Time spent with patient:30 min  Past Psychiatric History:   Past Medical History:  Past Medical History:  Diagnosis Date  . Anginal pain ([place])    admit 06/2014; had non-ischemic stress test  . Anxiety   . Bipolar 1 disorder ([place])   . Colon polyp   . CTS (carpal tunnel syndrome)   . Depression   . Diabetes mellitus without complication ([place])   . Fever blister   . GERD (gastroesophageal reflux disease)   . HA (headache)   .  HTN (hypertension)   . Hypercholesterolemia   . Migraines   . OA (osteoarthritis)   . Schizo-affective psychosis ([place])     Past Surgical History:  Procedure Laterality Date  . ANTERIOR CERVICAL DECOMP/DISCECTOMY FUSION  08/27/2011   Procedure: ANTERIOR CERVICAL DECOMPRESSION/DISCECTOMY FUSION 1 LEVEL/HARDWARE REMOVAL;  Surgeon: Eustace Moore, MD;  Location: [facility];  Service: Neurosurgery;  Laterality: Bilateral;  Cervical four-five Anterior cervical decompression/diskectomy, fusion, Plate, Removal of Cervical five-seven Plate  . back injection    . CARDIAC CATHETERIZATION N/A 11/09/2014   Procedure: Right Heart Cath;  Surgeon: Larey Dresser, MD;  Location: [place] [facility];  Service: Cardiovascular;  Laterality: N/A;  . CARPAL TUNNEL RELEASE  20110 rt/lt   rt x2 , lt x1  . COLONOSCOPY  06/2017   [facility]  . ESOPHAGOGASTRODUODENOSCOPY  06/2017   [facility]  . HEMORRHOID SURGERY    . MULTIPLE TOOTH EXTRACTIONS    . NECK SURGERY  2009  . PITUITARY SURGERY     Had gland removed from producing too much calcium  . polp removed  2011  . RIGHT/LEFT HEART CATH AND CORONARY ANGIOGRAPHY N/A 07/05/2017   Procedure: RIGHT/LEFT HEART CATH AND CORONARY ANGIOGRAPHY;  Surgeon: Larey Dresser, MD;  Location: [place] [facility];  Service: Cardiovascular;  Laterality: N/A;  . SHOULDER ARTHROSCOPY WITH ROTATOR CUFF REPAIR Right 05/23/2015   Procedure: RIGHT SHOULDER ARTHROSCOPY WITH REMOVAL OF SUTURE ANCHOR AND POSSIBLE REVISION ROTATOR CUFF REPAIR;  Surgeon: Justice Britain, MD;  Location: [place];  Service: Orthopedics;  Laterality: Right;  . SHOULDER ARTHROSCOPY WITH SUBACROMIAL DECOMPRESSION Right 01/24/2015   Procedure: RIGHT SHOULDER ARTHROSCOPY WITH SUBACROMIAL DECOMPRESSION AD DISTAL CLAVICLE RESECTION ;  Surgeon: Justice Britain, MD;  Location: [place];  Service: Orthopedics;  Laterality: Right;  Marland Kitchen VAGINAL DELIVERY     x3   Family History:  Family History  Problem  Relation Age of Onset  . Coronary artery disease Father   . Cancer Father        head neck   . Esophageal cancer Father   . Hypertension Mother   . Schizophrenia Mother   . Depression Brother   . Prostate cancer Brother   . Anesthesia problems Neg Hx   . Hypotension Neg Hx   . Malignant hyperthermia Neg Hx   . Pseudochol deficiency Neg Hx   . Allergic rhinitis Neg Hx   . Angioedema Neg Hx   . Asthma Neg Hx   . Atopy Neg Hx   . Eczema Neg Hx   . Immunodeficiency Neg Hx   . Urticaria Neg Hx   . Breast cancer Neg Hx    Family Psychiatric  History:  Social History:  Social History   Substance and Sexual Activity  Alcohol Use No  . Alcohol/week: 0.0 standard drinks     Social History   Substance and Sexual Activity  Drug Use No  Social History   Socioeconomic History  . Marital status: Single    Spouse name: Not on file  . Number of children: 3  . Years of education: Not on file  . Highest education level: Some college, no degree  Occupational History  . Occupation: disabled/retired  Social Needs  . Financial resource strain: Somewhat hard  . Food insecurity:    Worry: Sometimes true    Inability: Sometimes true  . Transportation needs:    Medical: Yes    Non-medical: Yes  Tobacco Use  . Smoking status: Current Some Day Smoker    Packs/day: 0.10    Years: 30.00    Pack years: 3.00    Types: Cigarettes  . Smokeless tobacco: Never Used  . Tobacco comment: using nicotrol inhaler  Substance and Sexual Activity  . Alcohol use: No    Alcohol/week: 0.0 standard drinks  . Drug use: No  . Sexual activity: Never  Lifestyle  . Physical activity:    Days per week: 0 days    Minutes per session: 0 min  . Stress: Rather much  Relationships  . Social connections:    Talks on phone: More than three times a week    Gets together: More than three times a week    Attends religious service: More than 4 times per year    Active member of club or organization: Yes     Attends meetings of clubs or organizations: More than 4 times per year    Relationship status: Never married  Other Topics Concern  . Not on file  Social History Narrative  . Not on file   Additional Social History:                         Sleep: Good  Appetite:  Fair  Current Medications: Current Outpatient Medications  Medication Sig Dispense Refill  . oxyCODONE-acetaminophen (PERCOCET) 10-325 MG tablet Take 1 tablet by mouth 2 (two) times daily.    . QUEtiapine (SEROQUEL) 25 MG tablet Take 1 tablet po bid and 1 prn 75 tablet 5  . aspirin EC 81 MG EC tablet Take 1 tablet (81 mg total) by mouth daily. (Patient not taking: Reported on 05/13/2018) 30 tablet 0  . atorvastatin (LIPITOR) 10 MG tablet Take 10 mg by mouth at bedtime.     . budesonide-formoterol (SYMBICORT) 160-4.5 MCG/ACT inhaler Inhale 2 puffs into the lungs 2 (two) times daily.    . cholecalciferol (VITAMIN D) 1000 UNITS tablet Take 1,000 Units by mouth daily.    . cyclobenzaprine (FLEXERIL) 5 MG tablet TAKE 2 TABLETS BY MOUTH 3 TIMES DAILY AS NEEDED  2  . dexlansoprazole (DEXILANT) 60 MG capsule Take 1 capsule (60 mg total) by mouth daily. (Patient not taking: Reported on 05/13/2018) 30 capsule 3  . Dexlansoprazole 30 MG capsule Take 30 mg by mouth daily.    . diazepam (VALIUM) 2 MG tablet 1 qam  2  qhs  For  2 weeks then 1 bid for 2weeks then 1  qhs  For 1 week  Then 1  prn 50 tablet 0  . doxepin (SINEQUAN) 50 MG capsule 1  qhs 30 capsule 5  . fluticasone (FLONASE) 50 MCG/ACT nasal spray Place 1 spray into both nostrils daily.    . folic acid (FOLVITE) 1 MG tablet Take 1 mg by mouth daily.    . furosemide (LASIX) 20 MG tablet Take 20 mg by mouth.    . linaclotide (  LINZESS) 145 MCG CAPS capsule Take 145 mcg by mouth daily as needed (costipation).     . magic mouthwash SOLN Take 5 mLs by mouth 3 (three) times daily as needed for mouth pain.    . methocarbamol (ROBAXIN) 750 MG tablet Take 1-2 tablets (750-1,500  mg total) by mouth 3 (three) times daily as needed for muscle spasms. (Patient not taking: Reported on 05/13/2018) 18 tablet 0  . metoCLOPramide (REGLAN) 10 MG tablet Take 1 tablet (10 mg total) by mouth every 6 (six) hours as needed for nausea (nausea/headache). (Patient not taking: Reported on 05/13/2018) 6 tablet 0  . Multiple Vitamins-Minerals (MULTIVITAMIN WITH MINERALS) tablet Take 1 tablet by mouth every morning.     . nortriptyline (PAMELOR) 25 MG capsule Take 2 capsules (50 mg total) by mouth at bedtime. (Patient not taking: Reported on 05/13/2018) 60 capsule 5  . PAZEO 0.7 % SOLN PLACE 1 DROP INTO BOTH EYES ONCE DAILY as needed for allergies  3  . potassium chloride SA (K-DUR,KLOR-CON) 20 MEQ tablet Take 20 mEq by mouth 2 (two) times daily.     Marland Kitchen triamterene-hydrochlorothiazide (DYAZIDE) 50-25 MG capsule Take 1 capsule by mouth daily.    . valACYclovir (VALTREX) 1000 MG tablet Take 1,000 mg by mouth daily.     . vitamin B-12 (CYANOCOBALAMIN) 1000 MCG tablet Take 1,000 mcg by mouth daily.     No current facility-administered medications for this visit.     Lab Results: No results found for this or any previous visit (from the past 48 hour(s)).  Physical Findings: AIMS:  , ,  ,  ,    CIWA:    COWS:     Musculoskeletal: Strength & Muscle Tone: within normal limits Gait & Station: normal Patient leans: N/A  Psychiatric Specialty Exam: ROS  Blood pressure (!) 148/85, pulse 81, height 5\' 7"  (1.702 m), weight 236 lb (107 kg).Body mass index is 36.96 kg/m.  General Appearance: Casual  Eye Contact::  Good  Speech:  Clear and Coherent  Volume:  Normal  Mood:  Euthymic  Affect:  Congruent  Thought Process:  Coherent  Orientation:  Full (Time, Place, and Person)  Thought Content:  WDL  Suicidal Thoughts:  No  Homicidal Thoughts:  No  Memory:  NA  Judgement:  Good  Insight:  Fair  Psychomotor Activity:  Normal  Concentration:  Fair  Recall:  Good  Fund of Knowledge:Good   Language: Good  Akathisia:  No  Handed:  Right  AIMS (if indicated):     Assets:   ADL's:  Intact  Cognition: WNL  Sleep:       Treatment Plan  05/13/2018, 9:09 AM At this time the patient is very stable.  Her first problem is adjustment disorder with an anxious mood state.  She was treated with Valium 5 mg 3 a day and today she will begin titrating it down and discontinuing it over the next 3 weeks.  We will change her to a 2 mg pill where she will take it 3 a day for 2 weeks then reduce it to 1 in the morning and 1 at night for 2 weeks then reduce it to 1 at night for 1 week and then discontinue it.  I shared with her that she can take a few remaining pills on a as needed basis.  The patient is in complete agreement with this.  Her anxiety level is much better.  Unfortunate the patient has not been able to maintain a  psychotherapist.  Her therapist has left her.  She is now searching for another therapist.  Her second problem is insomnia.  The patient takes doxepin which works very well.  The patient symptomatology would be considered to be mild to minimal.  She is functioning much better.  Her energy level is excellent.  She will return to see me in 3 or 4 months.  By that time hopefully she will found a therapist.  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

## 2018-05-27 ENCOUNTER — Other Ambulatory Visit: Payer: Self-pay | Admitting: Obstetrics and Gynecology

## 2018-05-27 DIAGNOSIS — M8580 Other specified disorders of bone density and structure, unspecified site: Secondary | ICD-10-CM

## 2018-06-06 ENCOUNTER — Other Ambulatory Visit (HOSPITAL_COMMUNITY): Payer: Self-pay | Admitting: Psychiatry

## 2018-06-29 ENCOUNTER — Other Ambulatory Visit: Payer: Self-pay

## 2018-06-29 ENCOUNTER — Telehealth (INDEPENDENT_AMBULATORY_CARE_PROVIDER_SITE_OTHER): Payer: Medicaid Other | Admitting: Psychiatry

## 2018-06-29 DIAGNOSIS — F4323 Adjustment disorder with mixed anxiety and depressed mood: Secondary | ICD-10-CM

## 2018-06-29 DIAGNOSIS — Z592 Discord with neighbors, lodgers and landlord: Secondary | ICD-10-CM

## 2018-06-29 DIAGNOSIS — Z79899 Other long term (current) drug therapy: Secondary | ICD-10-CM | POA: Diagnosis not present

## 2018-06-29 MED ORDER — DOXEPIN HCL 50 MG PO CAPS: ORAL_CAPSULE | ORAL | 5 refills | Status: DC

## 2018-06-29 MED ORDER — DIAZEPAM 5 MG PO TABS: ORAL_TABLET | ORAL | 2 refills | Status: DC

## 2018-06-29 NOTE — Progress Notes (Signed)
Patient ID: Gloria Lewis, female   DOB: [birth year], 63 y.o.   MRN: [id number] [facility] MD Progress Note  06/29/2018 5:09 PM LUCILIA YANNI  MRN:  [id number] Subjective:  Shoulder hurting Principal Problem: Major Depression,recurent Mild Diagnosis: Adjustment disorder with an anxious mood state  Today the patient seems to be distressed.  The major reason has to do with a very noisy problem neighbor.  A neighbor who is chronically mentally ill keeps knocking on his wall which affects the patient.  This neighbor looks into her windows.  This next her neighbor keeps her up all night.  She feels harassed by this neighbor.  Her landlord will not intervene.  The police have come and not been able to do anything about him.  She feels threatened by him.  This is making her anxious and dysphoric.  She is having great difficulty sleeping despite taking doxepin.  She is eating okay.  Her sons are okay.  At this time she is not working.  The patient is having difficulty getting a therapist.  Overall the patient is been very compliant with her medicines.  She is come off Valium without a problem.  At this time she is very distressed by not being able to sleep and being bothered by her neighbor.  I offered her some Valium to take at night and she agreed to.  I also offered to see if the therapist can see her at this setting.  The patient physically is stable.  She still has some chronic shoulder pain and takes OxyContin for her.  Patient drinks no alcohol uses no drugs.    Patient Active Problem List   Diagnosis Date Noted  . Infectious gastroenteritis [A09] 04/16/2016  . Xerostomia [R68.2] 03/09/2016  . Surgery, elective [Z41.9] 05/23/2015  . S/P arthroscopy of shoulder [Z98.890] 05/23/2015  . Chronic migraine without aura without status migrainosus, not intractable [G43.709] 10/18/2014  . Tobacco abuse [Z72.0] 10/18/2014  . Obesity [E66.9] 09/23/2014  . COPD [J44.9] 09/02/2014  . Pulmonary hypertension ([place])  [I27.20] 08/31/2014  . Respiratory failure with hypoxia ([place]) [J96.91] 08/14/2014  . Cigarette smoker [F17.210] 07/28/2014  . Major depressive disorder, recurrent episode, moderate ([place]) [F33.1] 07/06/2014  . Essential hypertension [I10]   . SOB (shortness of breath) [R06.02] 06/21/2014  . Precordial pain [R07.2] 06/21/2014  . Gastroesophageal reflux disease [K21.9] 06/21/2014  . Chest pain [R07.9] 06/21/2014  . HTN (hypertension) [I10]   . Neck pain [M54.2] 01/08/2014  . Major depressive disorder, recurrent episode, severe, without mention of psychotic behavior [F33.2] 10/14/2012  . Schizoaffective disorder ([place]) [F25.9] 05/26/2012  . 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 Hyperparathyroidism, primary ([place]) [E21.0] 10/06/2010  . DEGENERATIVE DISC DISEASE, LUMBOSACRAL SPINE [M51.37] 05/21/2007  . DERMATOPHYTOSIS OF THE BODY [B35.4] 05/10/2007  . 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 Anxiety state [F41.1] 03/24/2007  . DENTAL PAIN [K08.9] 03/24/2007  . SHOULDER PAIN, LEFT [M25.519] 03/24/2007   Total Time spent with patient:30 min  Past Psychiatric History:   Past Medical History:  Past Medical History:  Diagnosis Date  . Anginal pain ([place])    admit 06/2014; had non-ischemic stress test  . Anxiety   . Bipolar 1 disorder ([place])   . Colon polyp   . CTS (carpal tunnel syndrome)   . Depression   . Diabetes mellitus without complication ([place])   . Fever blister   . GERD (gastroesophageal reflux disease)   . HA (headache)   . HTN (hypertension)   . Hypercholesterolemia   .  Migraines   . OA (osteoarthritis)   . Schizo-affective psychosis ([place])     Past Surgical History:  Procedure Laterality Date  . ANTERIOR CERVICAL DECOMP/DISCECTOMY FUSION  08/27/2011   Procedure: ANTERIOR CERVICAL DECOMPRESSION/DISCECTOMY FUSION 1 LEVEL/HARDWARE REMOVAL;  Surgeon: Eustace Moore, MD;  Location: [place] [facility]UR[facility];  Service: Neurosurgery;  Laterality: Bilateral;  Cervical four-five  Anterior cervical decompression/diskectomy, fusion, Plate, Removal of Cervical five-seven Plate  . back injection    . CARDIAC CATHETERIZATION N/A 11/09/2014   Procedure: Right Heart Cath;  Surgeon: Larey Dresser, MD;  Location: [place] CV LAB;  Service: Cardiovascular;  Laterality: N/A;  . CARPAL TUNNEL RELEASE  20110 rt/lt   rt x2 , lt x1  . COLONOSCOPY  06/2017   [facility]  . ESOPHAGOGASTRODUODENOSCOPY  06/2017   [facility]  . HEMORRHOID SURGERY    . MULTIPLE TOOTH EXTRACTIONS    . NECK SURGERY  2009  . PITUITARY SURGERY     Had gland removed from producing too much calcium  . polp removed  2011  . RIGHT/LEFT HEART CATH AND CORONARY ANGIOGRAPHY N/A 07/05/2017   Procedure: RIGHT/LEFT HEART CATH AND CORONARY ANGIOGRAPHY;  Surgeon: Larey Dresser, MD;  Location: [place] CV LAB;  Service: Cardiovascular;  Laterality: N/A;  . SHOULDER ARTHROSCOPY WITH ROTATOR CUFF REPAIR Right 05/23/2015   Procedure: RIGHT SHOULDER ARTHROSCOPY WITH REMOVAL OF SUTURE ANCHOR AND POSSIBLE REVISION ROTATOR CUFF REPAIR;  Surgeon: Justice Britain, MD;  Location: [place];  Service: Orthopedics;  Laterality: Right;  . SHOULDER ARTHROSCOPY WITH SUBACROMIAL DECOMPRESSION Right 01/24/2015   Procedure: RIGHT SHOULDER ARTHROSCOPY WITH SUBACROMIAL DECOMPRESSION AD DISTAL CLAVICLE RESECTION ;  Surgeon: Justice Britain, MD;  Location: [place];  Service: Orthopedics;  Laterality: Right;  Marland Kitchen VAGINAL DELIVERY     x3   Family History:  Family History  Problem Relation Age of Onset  . Coronary artery disease Father   . Cancer Father        head neck   . Esophageal cancer Father   . Hypertension Mother   . Schizophrenia Mother   . Depression Brother   . Prostate cancer Brother   . Anesthesia problems Neg Hx   . Hypotension Neg Hx   . Malignant hyperthermia Neg Hx   . Pseudochol deficiency Neg Hx   . Allergic rhinitis Neg Hx   . Angioedema Neg Hx   . Asthma Neg Hx   . Atopy Neg Hx   . Eczema  Neg Hx   . Immunodeficiency Neg Hx   . Urticaria Neg Hx   . Breast cancer Neg Hx    Family Psychiatric  History:  Social History:  Social History   Substance and Sexual Activity  Alcohol Use No  . Alcohol/week: 0.0 standard drinks     Social History   Substance and Sexual Activity  Drug Use No    Social History   Socioeconomic History  . Marital status: Single    Spouse name: Not on file  . Number of children: 3  . Years of education: Not on file  . Highest education level: Some college, no degree  Occupational History  . Occupation: disabled/retired  Social Needs  . Financial resource strain: Somewhat hard  . Food insecurity:    Worry: Sometimes true    Inability: Sometimes true  . Transportation needs:    Medical: Yes    Non-medical: Yes  Tobacco Use  . Smoking status: Current Some Day Smoker    Packs/day:  0.10    Years: 30.00    Pack years: 3.00    Types: Cigarettes  . Smokeless tobacco: Never Used  . Tobacco comment: using nicotrol inhaler  Substance and Sexual Activity  . Alcohol use: No    Alcohol/week: 0.0 standard drinks  . Drug use: No  . Sexual activity: Never  Lifestyle  . Physical activity:    Days per week: 0 days    Minutes per session: 0 min  . Stress: Rather much  Relationships  . Social connections:    Talks on phone: More than three times a week    Gets together: More than three times a week    Attends religious service: More than 4 times per year    Active member of club or organization: Yes    Attends meetings of clubs or organizations: More than 4 times per year    Relationship status: Never married  Other Topics Concern  . Not on file  Social History Narrative  . Not on file   Additional Social History:                         Sleep: Good  Appetite:  Fair  Current Medications: Current Outpatient Medications  Medication Sig Dispense Refill  . aspirin EC 81 MG EC tablet Take 1 tablet (81 mg total) by mouth  daily. (Patient not taking: Reported on 05/13/2018) 30 tablet 0  . atorvastatin (LIPITOR) 10 MG tablet Take 10 mg by mouth at bedtime.     . budesonide-formoterol (SYMBICORT) 160-4.5 MCG/ACT inhaler Inhale 2 puffs into the lungs 2 (two) times daily.    . cholecalciferol (VITAMIN D) 1000 UNITS tablet Take 1,000 Units by mouth daily.    . cyclobenzaprine (FLEXERIL) 5 MG tablet TAKE 2 TABLETS BY MOUTH 3 TIMES DAILY AS NEEDED  2  . dexlansoprazole (DEXILANT) 60 MG capsule Take 1 capsule (60 mg total) by mouth daily. (Patient not taking: Reported on 05/13/2018) 30 capsule 3  . Dexlansoprazole 30 MG capsule Take 30 mg by mouth daily.    . diazepam (VALIUM) 2 MG tablet 1 qam  2  qhs  For  2 weeks then 1 bid for 2weeks then 1  qhs  For 1 week  Then 1  prn 50 tablet 0  . diazepam (VALIUM) 5 MG tablet 1  qhs 30 tablet 2  . doxepin (SINEQUAN) 50 MG capsule 1  qhs 30 capsule 5  . fluticasone (FLONASE) 50 MCG/ACT nasal spray Place 1 spray into both nostrils daily.    . folic acid (FOLVITE) 1 MG tablet Take 1 mg by mouth daily.    . furosemide (LASIX) 20 MG tablet Take 20 mg by mouth.    . linaclotide (LINZESS) 145 MCG CAPS capsule Take 145 mcg by mouth daily as needed (costipation).     . magic mouthwash SOLN Take 5 mLs by mouth 3 (three) times daily as needed for mouth pain.    . methocarbamol (ROBAXIN) 750 MG tablet Take 1-2 tablets (750-1,500 mg total) by mouth 3 (three) times daily as needed for muscle spasms. (Patient not taking: Reported on 05/13/2018) 18 tablet 0  . metoCLOPramide (REGLAN) 10 MG tablet Take 1 tablet (10 mg total) by mouth every 6 (six) hours as needed for nausea (nausea/headache). (Patient not taking: Reported on 05/13/2018) 6 tablet 0  . Multiple Vitamins-Minerals (MULTIVITAMIN WITH MINERALS) tablet Take 1 tablet by mouth every morning.     . nortriptyline (PAMELOR) 25  MG capsule Take 2 capsules (50 mg total) by mouth at bedtime. (Patient not taking: Reported on 05/13/2018) 60 capsule 5  .  oxyCODONE-acetaminophen (PERCOCET) 10-325 MG tablet Take 1 tablet by mouth 2 (two) times daily.    Marland Kitchen PAZEO 0.7 % SOLN PLACE 1 DROP INTO BOTH EYES ONCE DAILY as needed for allergies  3  . potassium chloride SA (K-DUR,KLOR-CON) 20 MEQ tablet Take 20 mEq by mouth 2 (two) times daily.     . QUEtiapine (SEROQUEL) 25 MG tablet Take 1 tablet po bid and 1 prn 75 tablet 5  . triamterene-hydrochlorothiazide (DYAZIDE) 50-25 MG capsule Take 1 capsule by mouth daily.    . valACYclovir (VALTREX) 1000 MG tablet Take 1,000 mg by mouth daily.     . vitamin B-12 (CYANOCOBALAMIN) 1000 MCG tablet Take 1,000 mcg by mouth daily.     No current facility-administered medications for this visit.     Lab Results: No results found for this or any previous visit (from the past 48 hour(s)).  Physical Findings: AIMS:  , ,  ,  ,    CIWA:    COWS:     Musculoskeletal: Strength & Muscle Tone: within normal limits Gait & Station: normal Patient leans: N/A  Psychiatric Specialty Exam: ROS  There were no vitals taken for this visit.There is no height or weight on file to calculate BMI.  General Appearance: Casual  Eye Contact::  Good  Speech:  Clear and Coherent  Volume:  Normal  Mood:  Euthymic  Affect:  Congruent  Thought Process:  Coherent  Orientation:  Full (Time, Place, and Person)  Thought Content:  WDL  Suicidal Thoughts:  No  Homicidal Thoughts:  No  Memory:  NA  Judgement:  Good  Insight:  Fair  Psychomotor Activity:  Normal  Concentration:  Fair  Recall:  Good  Fund of Knowledge:Good  Language: Good  Akathisia:  No  Handed:  Right  AIMS (if indicated):     Assets:   ADL's:  Intact  Cognition: WNL  Sleep:       Treatment Plan  06/29/2018, 5:09 PM  The patient is #1 problem is an adjustment disorder with an anxious mood state.  This clearly affects her ability in terms of sleeping and feeling safe in her own environment.  At this time we will begin her on Valium 5 mg nightly.  She will  continue taking doxepin as prescribed.  This hopefully will help her sleep as well.  We will make contact with her in about 5 to 6 weeks.  Unfortunately we do not have a therapist available to see her and will call her and tell her this.  Just to continue to search for a therapist on her own.  Patient is not suicidal.  She amazingly functions well.

## 2018-07-01 ENCOUNTER — Encounter: Payer: Self-pay | Admitting: Neurology

## 2018-07-08 ENCOUNTER — Other Ambulatory Visit (HOSPITAL_COMMUNITY): Payer: Self-pay

## 2018-07-08 MED ORDER — DIAZEPAM 2 MG PO TABS: ORAL_TABLET | ORAL | 0 refills | Status: DC

## 2018-07-08 NOTE — Progress Notes (Signed)
Patient called with increased anxiety, I spoke with Dr. Casimiro Needle and he ordered an increase in her Diazepam to 2 mg BID. This was called into the pharmacy and patient was informed

## 2018-07-13 ENCOUNTER — Other Ambulatory Visit: Payer: Self-pay

## 2018-07-14 ENCOUNTER — Telehealth (HOSPITAL_COMMUNITY): Payer: Self-pay | Admitting: Psychiatry

## 2018-07-14 NOTE — Telephone Encounter (Signed)
D:  Completed letter recommending a support dog and Dr. Casimiro Needle signed.  A:  Placed call to pt to inform her that it's complete.  Pt requesting Probation officer to mail it.  Writer will drop it in the mail today.  R:  Pt receptive.

## 2018-08-03 ENCOUNTER — Other Ambulatory Visit (HOSPITAL_COMMUNITY): Payer: Self-pay

## 2018-08-03 ENCOUNTER — Other Ambulatory Visit (HOSPITAL_COMMUNITY): Payer: Self-pay | Admitting: Psychiatry

## 2018-08-10 ENCOUNTER — Other Ambulatory Visit (HOSPITAL_COMMUNITY): Payer: Self-pay

## 2018-08-10 MED ORDER — DIAZEPAM 2 MG PO TABS: ORAL_TABLET | ORAL | 0 refills | Status: DC

## 2018-08-12 ENCOUNTER — Ambulatory Visit (HOSPITAL_COMMUNITY): Payer: Self-pay | Admitting: Psychiatry

## 2018-09-09 ENCOUNTER — Telehealth: Payer: Self-pay | Admitting: Neurology

## 2018-09-09 ENCOUNTER — Other Ambulatory Visit: Payer: Self-pay

## 2018-09-09 NOTE — Telephone Encounter (Signed)
Patient said she was calling you back about medications. Maybe questions before her VV next Wednesday? Thanks!

## 2018-09-13 ENCOUNTER — Encounter: Payer: Self-pay | Admitting: Neurology

## 2018-09-13 NOTE — Telephone Encounter (Signed)
Spoke with patient.

## 2018-09-13 NOTE — Progress Notes (Addendum)
Virtual Visit via Video Note The purpose of this virtual visit is to provide medical care while limiting exposure to the novel coronavirus.    Consent was obtained for video visit:  Yes.   Answered questions that patient had about telehealth interaction:  Yes.   I discussed the limitations, risks, security and privacy concerns of performing an evaluation and management service by telemedicine. I also discussed with the patient that there may be a patient responsible charge related to this service. The patient expressed understanding and agreed to proceed.  Pt location: Home Physician Location: Home Name of referring provider:  Harlan Stains, MD I connected with Eileen Stanford at patients initiation/request on 09/14/2018 at 10:50 AM EDT by video enabled telemedicine application and verified that I am speaking with the correct person using two identifiers. Pt MRN:  [id number] Pt DOB:  06-Jun-1955 Video Participants:  Eileen Stanford   History of Present Illness:  Gloria Lewis is a 63 year old right-handed woman with history of hypertension, prior drug and alcohol abuse, major depressive disorder, bipolar disorder, schizoaffective disorder, migraines, tension headache, cervical disc disease status post 2 surgeries, lumbar degenerative disc disease, osteoarthritis, prolapsed bladder, hypercholesterolemia, IBS, and GERDn who presents for migraines.  History supplemented by referring provider note.  I previously treated Ms. Dinse for chronic migraines, last seen in 2016.  Onset:  Since childhood but worse since 2009 Location:  Bi-temporal and at base of skull, neck pain sometimes radiating down into right shoulder Quality:  Constant severe aching, throbbing Intensity:  8/10.  She denies new headache, thunderclap headache or severe headache that wakes her from sleep. Aura:  no Premonitory Phase:  no Postdrome:  no Associated symptoms:  Nausea, photophobia, phonophobia, osmophobia,  blurred vision.  She denies associated unilateral numbness or weakness. Duration:  Constant but eases if takes a Sudafed Frequency:  Daily, severe 1 to 2 days a week Frequency of abortive medication: Sudafed daily, Excedrin one to twice a week Triggers:  Emotional stress, sleeping on right side (where she had cervical spine surgery), movement, neck pain, seasonal allergies, bright sunlight Relieving factors:  Laying down to rest in dark room Activity: Cannot function  Current NSAIDS:  ASA 81mg  Current analgesics:  Excedrin, Percocet (for back and shoulder pain) Current triptans:  none Current ergotamine:  none Current anti-emetic:  none Current muscle relaxants:  Flexeril Current anti-anxiolytic:  Valilum Current sleep aide:  none Current Antihypertensive medications:  Lasix, Dyazid Current Antidepressant medications:  Doxepin 50mg  Current Anticonvulsant medications:  none Current anti-CGRP:  Emgality (on it since last August) Current Vitamins/Herbal/Supplements:  B12 Current Antihistamines/Decongestants:  Sudafed, Zyrtec Other therapy:  none Hormone/birth control:  none  Past NSAIDS:  Advil, Aleve Past analgesics:  Tylenol, Percocet Past abortive triptans:  none Past abortive ergotamine:  none Past muscle relaxants:  Flexeril, Robaxin, Zanflex Past anti-emetic:  Chlorpromazine HCL 25-50mg , metoclopramide 10mg , promethazine Past antihypertensive medications:  Propranolol (caused depression) Past antidepressant medications:  Nortriptyline (advised to stop due to chest pain), Zoloft (increased headaches) Past anticonvulsant medications:  zonisamide 400mg , topiramate, Lyrica Past anti-CGRP:  none Past vitamins/Herbal/Supplements:  MVI Past antihistamines/decongestants:  Flonase Other past therapies:  Physical therapy for neck, acupuncture, trigger point injections  Past Medical History: Past Medical History:  Diagnosis Date  . Anginal pain ([place])    admit 06/2014; had  non-ischemic stress test  . Anxiety   . Bipolar 1 disorder ([place])   . Colon polyp   . CTS (carpal tunnel syndrome)   .  Depression   . Diabetes mellitus without complication ([place])   . Fever blister   . GERD (gastroesophageal reflux disease)   . HA (headache)   . HTN (hypertension)   . Hypercholesterolemia   . Migraines   . OA (osteoarthritis)   . Schizo-affective psychosis ([place])     Medications: Outpatient Encounter Medications as of 09/14/2018  Medication Sig Note  . aspirin EC 81 MG EC tablet Take 1 tablet (81 mg total) by mouth daily. (Patient not taking: Reported on 05/13/2018)   . atorvastatin (LIPITOR) 10 MG tablet Take 10 mg by mouth at bedtime.    . budesonide-formoterol (SYMBICORT) 160-4.5 MCG/ACT inhaler Inhale 2 puffs into the lungs 2 (two) times daily.   . cholecalciferol (VITAMIN D) 1000 UNITS tablet Take 1,000 Units by mouth daily.   . cyclobenzaprine (FLEXERIL) 5 MG tablet TAKE 2 TABLETS BY MOUTH 3 TIMES DAILY AS NEEDED   . dexlansoprazole (DEXILANT) 60 MG capsule Take 1 capsule (60 mg total) by mouth daily. (Patient not taking: Reported on 05/13/2018)   . Dexlansoprazole 30 MG capsule Take 30 mg by mouth daily.   . diazepam (VALIUM) 2 MG tablet 1 tablet by mouth two times daily as needed for anxiety   . diazepam (VALIUM) 5 MG tablet 1  qhs   . doxepin (SINEQUAN) 50 MG capsule 1  qhs   . fluticasone (FLONASE) 50 MCG/ACT nasal spray Place 1 spray into both nostrils daily.   . folic acid (FOLVITE) 1 MG tablet Take 1 mg by mouth daily.   . furosemide (LASIX) 20 MG tablet Take 20 mg by mouth.   . linaclotide (LINZESS) 145 MCG CAPS capsule Take 145 mcg by mouth daily as needed (costipation).    . magic mouthwash SOLN Take 5 mLs by mouth 3 (three) times daily as needed for mouth pain.   . methocarbamol (ROBAXIN) 750 MG tablet Take 1-2 tablets (750-1,500 mg total) by mouth 3 (three) times daily as needed for muscle spasms. (Patient not taking: Reported on 05/13/2018)   .  metoCLOPramide (REGLAN) 10 MG tablet Take 1 tablet (10 mg total) by mouth every 6 (six) hours as needed for nausea (nausea/headache). (Patient not taking: Reported on 05/13/2018)   . Multiple Vitamins-Minerals (MULTIVITAMIN WITH MINERALS) tablet Take 1 tablet by mouth every morning.    . nortriptyline (PAMELOR) 25 MG capsule Take 2 capsules (50 mg total) by mouth at bedtime. (Patient not taking: Reported on 05/13/2018)   . oxyCODONE-acetaminophen (PERCOCET) 10-325 MG tablet Take 1 tablet by mouth 2 (two) times daily. 11/06/2017: LF 10/06/17 #60/30day  . PAZEO 0.7 % SOLN PLACE 1 DROP INTO BOTH EYES ONCE DAILY as needed for allergies   . potassium chloride SA (K-DUR,KLOR-CON) 20 MEQ tablet Take 20 mEq by mouth 2 (two) times daily.    . QUEtiapine (SEROQUEL) 25 MG tablet Take 1 tablet po bid and 1 prn   . triamterene-hydrochlorothiazide (DYAZIDE) 50-25 MG capsule Take 1 capsule by mouth daily.   . valACYclovir (VALTREX) 1000 MG tablet Take 1,000 mg by mouth daily.    . vitamin B-12 (CYANOCOBALAMIN) 1000 MCG tablet Take 1,000 mcg by mouth daily.    No facility-administered encounter medications on file as of 09/14/2018.     Allergies: Allergies  Allergen Reactions  . Effexor [Venlafaxine Hydrochloride] Itching and Other (See Comments)    headache  . Latex Itching and Rash  . Penicillins Hives    Has patient had a PCN reaction causing immediate rash, facial/tongue/throat swelling, SOB or lightheadedness  with hypotension: Yes Has patient had a PCN reaction causing severe rash involving mucus membranes or skin necrosis: No Has patient had a PCN reaction that required hospitalization No Has patient had a PCN reaction occurring within the last 10 years: No If all of the above answers are "NO", then may proceed with Cephalosporin use.   . Zithromax [Azithromycin Dihydrate] Swelling  . Amlodipine Swelling  . Atorvastatin Swelling  . Butrans [Buprenorphine] Other (See Comments)    Ulcers-"mouth would not  heal"  . Codeine Itching    Tolerable with benadryl  . Nucynta [Tapentadol] Other (See Comments)    Ulcers inside of mouth  . Vicodin [Hydrocodone-Acetaminophen] Itching  . Chantix [Varenicline Tartrate] Nausea Only  . Paroxetine Hcl Other (See Comments)    headache  . Tramadol Other (See Comments)    Pt states it interacted with her sertraline, but she is no longer on sertraline.  She does not remember the type of reaction she had.     Family History: Family History  Problem Relation Age of Onset  . Coronary artery disease Father   . Cancer Father        head neck   . Esophageal cancer Father   . Hypertension Mother   . Schizophrenia Mother   . Depression Brother   . Prostate cancer Brother   . Anesthesia problems Neg Hx   . Hypotension Neg Hx   . Malignant hyperthermia Neg Hx   . Pseudochol deficiency Neg Hx   . Allergic rhinitis Neg Hx   . Angioedema Neg Hx   . Asthma Neg Hx   . Atopy Neg Hx   . Eczema Neg Hx   . Immunodeficiency Neg Hx   . Urticaria Neg Hx   . Breast cancer Neg Hx     Social History: Social History   Socioeconomic History  . Marital status: Single    Spouse name: Not on file  . Number of children: 3  . Years of education: Not on file  . Highest education level: Some college, no degree  Occupational History  . Occupation: disabled/retired  Social Needs  . Financial resource strain: Somewhat hard  . Food insecurity:    Worry: Sometimes true    Inability: Sometimes true  . Transportation needs:    Medical: Yes    Non-medical: Yes  Tobacco Use  . Smoking status: Current Some Day Smoker    Packs/day: 0.10    Years: 30.00    Pack years: 3.00    Types: Cigarettes  . Smokeless tobacco: Never Used  . Tobacco comment: using nicotrol inhaler  Substance and Sexual Activity  . Alcohol use: No    Alcohol/week: 0.0 standard drinks  . Drug use: No  . Sexual activity: Never  Lifestyle  . Physical activity:    Days per week: 0 days     Minutes per session: 0 min  . Stress: Rather much  Relationships  . Social connections:    Talks on phone: More than three times a week    Gets together: More than three times a week    Attends religious service: More than 4 times per year    Active member of club or organization: Yes    Attends meetings of clubs or organizations: More than 4 times per year    Relationship status: Never married  . Intimate partner violence:    Fear of current or ex partner: No    Emotionally abused: No    Physically abused: No  Forced sexual activity: No  Other Topics Concern  . Not on file  Social History Narrative  . Not on file   Observations/Objective:   Height 5\' 7"  (1.702 m), weight 238 lb (108 kg). No acute distress.  Alert and oriented.  Speech fluent and not dysarthric.  Language intact.  Face symmetric.    Assessment and Plan:   1.  Chronic migraine without aura, without status migrainosus, intractable, likely aggravated by Sudafed overuse.  1.  Failed Emgality.  For preventative management, switch from Emgality to [place].  If ineffective, I recommended Botox. 2.  For abortive therapy, Excedrin 3.  Limit use of pain relievers to no more than 2 days out of week to prevent risk of rebound or medication-overuse headache. 4.  Keep headache diary 5.  Exercise, hydration, caffeine cessation, sleep hygiene, monitor for and avoid triggers 6.  Consider:  magnesium citrate 400mg  daily, riboflavin 400mg  daily, and coenzyme Q10 100mg  three times daily 7. Always keep in mind that currently taking a hormone or birth control may be a possible trigger or aggravating factor for migraine. 8. Follow up in 4 months.   Follow Up Instructions:    -I discussed the assessment and treatment plan with the patient. The patient was provided an opportunity to ask questions and all were answered. The patient agreed with the plan and demonstrated an understanding of the instructions.   The patient was advised  to call back or seek an in-person evaluation if the symptoms worsen or if the condition fails to improve as anticipated.  Time spent with patient:  40 minutes    Dudley Major, DO

## 2018-09-14 ENCOUNTER — Encounter: Payer: Self-pay | Admitting: Neurology

## 2018-09-14 ENCOUNTER — Other Ambulatory Visit: Payer: Self-pay

## 2018-09-14 ENCOUNTER — Telehealth (INDEPENDENT_AMBULATORY_CARE_PROVIDER_SITE_OTHER): Payer: Medicaid Other | Admitting: Neurology

## 2018-09-14 VITALS — Ht 67.0 in | Wt 238.0 lb

## 2018-09-14 DIAGNOSIS — G43709 Chronic migraine without aura, not intractable, without status migrainosus: Secondary | ICD-10-CM | POA: Diagnosis not present

## 2018-09-14 DIAGNOSIS — IMO0002 Reserved for concepts with insufficient information to code with codable children: Secondary | ICD-10-CM

## 2018-09-14 MED ORDER — FREMANEZUMAB-VFRM 225 MG/1.5ML ~~LOC~~ SOAJ: 225.0000 mg | Freq: Once | SUBCUTANEOUS | 0 refills | Status: AC

## 2018-09-14 MED ORDER — FREMANEZUMAB-VFRM 225 MG/1.5ML ~~LOC~~ SOAJ: 225.0000 mg | SUBCUTANEOUS | 11 refills | Status: DC

## 2018-09-14 NOTE — Progress Notes (Addendum)
Filled out CGRP form for [place] [facility], waiting on provider signature to fax  Faxing to [facility]

## 2018-09-16 ENCOUNTER — Ambulatory Visit: Payer: Self-pay | Admitting: Neurology

## 2018-09-16 ENCOUNTER — Other Ambulatory Visit (HOSPITAL_COMMUNITY): Payer: Self-pay

## 2018-09-16 MED ORDER — DIAZEPAM 2 MG PO TABS: ORAL_TABLET | ORAL | 0 refills | Status: DC

## 2018-10-04 ENCOUNTER — Telehealth (HOSPITAL_COMMUNITY): Payer: Self-pay

## 2018-10-17 ENCOUNTER — Other Ambulatory Visit (HOSPITAL_COMMUNITY): Payer: Self-pay | Admitting: Psychiatry

## 2018-10-19 ENCOUNTER — Telehealth: Payer: Self-pay | Admitting: Neurology

## 2018-10-19 MED ORDER — AJOVY 225 MG/1.5ML ~~LOC~~ SOAJ: 225.0000 mg | SUBCUTANEOUS | 11 refills | Status: DC

## 2018-10-19 NOTE — Telephone Encounter (Signed)
Patient called in wanting the Ajovy medication approved by medicare for [facility] [address]. Thanks!

## 2018-10-19 NOTE — Telephone Encounter (Addendum)
Called Pt, went to her VM. LMOVM advising her I have spoken with [facility] today Butch Penny) was advised was sent for review. I  am to call back tomorrow morning for determination.  Rx for Ajovy previously was sent to Mail order, [facility]  Pt called back, advised her.

## 2018-10-19 NOTE — Telephone Encounter (Signed)
Let message with the after hour service 10-19-18 @ 12:25 pm    Caller is needing to get a medication approved by medicare

## 2018-10-19 NOTE — Progress Notes (Signed)
Called [place] [facility], spoke with Butch Penny. She states fax was never rcvd. Went over clinicals verbally. PA reference # [id number]

## 2018-10-20 ENCOUNTER — Telehealth: Payer: Self-pay | Admitting: Neurology

## 2018-10-20 ENCOUNTER — Other Ambulatory Visit (HOSPITAL_COMMUNITY): Payer: Self-pay

## 2018-10-20 MED ORDER — FLURAZEPAM HCL 30 MG PO CAPS: 30.0000 mg | ORAL_CAPSULE | Freq: Every evening | ORAL | 0 refills | Status: DC | PRN

## 2018-10-20 MED ORDER — DIAZEPAM 2 MG PO TABS: ORAL_TABLET | ORAL | 0 refills | Status: DC

## 2018-10-20 NOTE — Telephone Encounter (Signed)
error 

## 2018-10-21 NOTE — Telephone Encounter (Signed)
Pt called about PA, she states she called pharmacy and was told Rx was $2000.   I called [facility], spoke with Etha. Ajovy approved.   [facility]ed [facility], spoke with [place]. Ajovy has to ordered, however she has one pen that someone has not picked up to be reurned to stock, will get ready for Pt.  Called Pt, LMOVM advising her to wait one hour and pick up Rx for $3 copay

## 2018-10-21 NOTE — Progress Notes (Signed)
Called [place] [facility], spoke with [place]. Ajovy approved. 10/19/18 - 10/13-2020

## 2018-11-08 ENCOUNTER — Encounter (HOSPITAL_COMMUNITY): Payer: Self-pay | Admitting: Emergency Medicine

## 2018-11-08 ENCOUNTER — Telehealth: Payer: Self-pay | Admitting: Neurology

## 2018-11-08 ENCOUNTER — Emergency Department (HOSPITAL_COMMUNITY)
Admission: EM | Admit: 2018-11-08 | Discharge: 2018-11-08 | Disposition: A | Discharge status: Home / Self Care | Payer: Medicaid Other | Attending: Emergency Medicine | Admitting: Emergency Medicine

## 2018-11-08 ENCOUNTER — Other Ambulatory Visit: Payer: Self-pay

## 2018-11-08 DIAGNOSIS — R51 Headache: Secondary | ICD-10-CM | POA: Diagnosis present

## 2018-11-08 DIAGNOSIS — Z91040 Latex allergy status: Secondary | ICD-10-CM | POA: Diagnosis not present

## 2018-11-08 DIAGNOSIS — I10 Essential (primary) hypertension: Secondary | ICD-10-CM | POA: Diagnosis not present

## 2018-11-08 DIAGNOSIS — G43801 Other migraine, not intractable, with status migrainosus: Secondary | ICD-10-CM | POA: Insufficient documentation

## 2018-11-08 DIAGNOSIS — Z79899 Other long term (current) drug therapy: Secondary | ICD-10-CM | POA: Diagnosis not present

## 2018-11-08 DIAGNOSIS — J449 Chronic obstructive pulmonary disease, unspecified: Secondary | ICD-10-CM | POA: Insufficient documentation

## 2018-11-08 DIAGNOSIS — F17210 Nicotine dependence, cigarettes, uncomplicated: Secondary | ICD-10-CM | POA: Diagnosis not present

## 2018-11-08 DIAGNOSIS — E119 Type 2 diabetes mellitus without complications: Secondary | ICD-10-CM | POA: Diagnosis not present

## 2018-11-08 MED ORDER — KETOROLAC TROMETHAMINE 30 MG/ML IJ SOLN
30.0000 mg | Freq: Once | INTRAMUSCULAR | Status: AC
  Administered 2018-11-08: 22:00:00 30 mg via INTRAMUSCULAR
  Filled 2018-11-08: qty 1

## 2018-11-08 MED ORDER — PROCHLORPERAZINE EDISYLATE 10 MG/2ML IJ SOLN
10.0000 mg | Freq: Once | INTRAMUSCULAR | Status: AC
  Administered 2018-11-08: 10 mg via INTRAMUSCULAR
  Filled 2018-11-08: qty 2

## 2018-11-08 NOTE — ED Notes (Signed)
ED Provider at bedside. 

## 2018-11-08 NOTE — Telephone Encounter (Signed)
Per Dr. Tomi Likens, [place] to call in sumatriptan 100 mg and ondansetron 8 mg.  Called Pt, she was in ED. I asked what medication she was talking about in her message, she said Ajovy. I advise her it was approved until October. She said she couldn't use it again until the 15th. I advised her it was only to be used once a month as a preventive, not as an abortive.  I advised her of the medications we were going to call in, she stated she already had Zofran, it was not helping.  Pt will call tomorrow if needed.

## 2018-11-08 NOTE — ED Notes (Signed)
Discharge instructions discussed with pt. Pt verbalized understanding. No questions at this time. Signature pad not available for signature

## 2018-11-08 NOTE — ED Provider Notes (Signed)
[facility] EMERGENCY DEPARTMENT Provider Note   CSN: [id number] Arrival date & time: 11/08/18  1526    History   Chief Complaint Chief Complaint  Patient presents with  . Migraine    HPI Gloria Lewis is a 63 y.o. female.     HPI Pt has a history of migraines.  She has been having trouble off and on for years.  Pt states she started having a headache again over a month ago.  She saw her doctor and was given an injection but it has not helped.  No fevers.  No vomiting.   No focal numbness or weakness.  She has had a lot of stress recently and that is contributing Past Medical History:  Diagnosis Date  . Anginal pain ([place])    admit 06/2014; had non-ischemic stress test  . Anxiety   . Bipolar 1 disorder ([place])   . Colon polyp   . CTS (carpal tunnel syndrome)   . Depression   . Diabetes mellitus without complication ([place])   . Fever blister   . GERD (gastroesophageal reflux disease)   . HA (headache)   . HTN (hypertension)   . Hypercholesterolemia   . Migraines   . OA (osteoarthritis)   . Schizo-affective psychosis [facility])     Patient Active Problem List   Diagnosis Date Noted  . Infectious gastroenteritis 04/16/2016  . Xerostomia 03/09/2016  . Surgery, elective 05/23/2015  . S/P arthroscopy of shoulder 05/23/2015  . Chronic migraine without aura without status migrainosus, not intractable 10/18/2014  . Tobacco abuse 10/18/2014  . Obesity 09/23/2014  . COPD 09/02/2014  . Pulmonary hypertension ([place]) 08/31/2014  . Respiratory failure with hypoxia ([place]) 08/14/2014  . Cigarette smoker 07/28/2014  . Major depressive disorder, recurrent episode, moderate ([place]) 07/06/2014  . Essential hypertension   . SOB (shortness of breath) 06/21/2014  . Precordial pain 06/21/2014  . Gastroesophageal reflux disease 06/21/2014  . Chest pain 06/21/2014  . HTN (hypertension)   . Neck pain 01/08/2014  . Major depressive disorder, recurrent episode, severe, without  mention of psychotic behavior 10/14/2012  . Schizoaffective disorder ([place]) 05/26/2012  . Parathyroid adenoma 10/06/2010  . Hyperparathyroidism, primary ([place]) 10/06/2010  . DEGENERATIVE DISC DISEASE, LUMBOSACRAL SPINE 05/21/2007  . DERMATOPHYTOSIS OF THE BODY 05/10/2007  . 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 Anxiety state 03/24/2007  . DENTAL PAIN 03/24/2007  . SHOULDER PAIN, LEFT 03/24/2007    Past Surgical History:  Procedure Laterality Date  . ANTERIOR CERVICAL DECOMP/DISCECTOMY FUSION  08/27/2011   Procedure: ANTERIOR CERVICAL DECOMPRESSION/DISCECTOMY FUSION 1 LEVEL/HARDWARE REMOVAL;  Surgeon: Eustace Moore, MD;  Location: [facility];  Service: Neurosurgery;  Laterality: Bilateral;  Cervical four-five Anterior cervical decompression/diskectomy, fusion, Plate, Removal of Cervical five-seven Plate  . back injection    . CARDIAC CATHETERIZATION N/A 11/09/2014   Procedure: Right Heart Cath;  Surgeon: Larey Dresser, MD;  Location: [facility];  Service: Cardiovascular;  Laterality: N/A;  . CARPAL TUNNEL RELEASE  20110 rt/lt   rt x2 , lt x1  . COLONOSCOPY  06/2017   [facility]  . ESOPHAGOGASTRODUODENOSCOPY  06/2017   [facility]  . HEMORRHOID SURGERY    . MULTIPLE TOOTH EXTRACTIONS    . NECK SURGERY  2009  . PITUITARY SURGERY     Had gland removed from producing too much calcium  . polp removed  2011  . RIGHT/LEFT HEART CATH AND CORONARY ANGIOGRAPHY N/A 07/05/2017   Procedure: RIGHT/LEFT HEART  CATH AND CORONARY ANGIOGRAPHY;  Surgeon: Larey Dresser, MD;  Location: [facility];  Service: Cardiovascular;  Laterality: N/A;  . SHOULDER ARTHROSCOPY WITH ROTATOR CUFF REPAIR Right 05/23/2015   Procedure: RIGHT SHOULDER ARTHROSCOPY WITH REMOVAL OF SUTURE ANCHOR AND POSSIBLE REVISION ROTATOR CUFF REPAIR;  Surgeon: Justice Britain, MD;  Location: [facility];  Service: Orthopedics;  Laterality: Right;  . SHOULDER ARTHROSCOPY WITH SUBACROMIAL DECOMPRESSION  Right 01/24/2015   Procedure: RIGHT SHOULDER ARTHROSCOPY WITH SUBACROMIAL DECOMPRESSION AD DISTAL CLAVICLE RESECTION ;  Surgeon: Justice Britain, MD;  Location: [facility];  Service: Orthopedics;  Laterality: Right;  [facility] VAGINAL DELIVERY     x3     OB History   No obstetric history on file.      Home Medications    Prior to Admission medications   Medication Sig Start Date End Date Taking? Authorizing Provider  budesonide-formoterol (SYMBICORT) 160-4.5 MCG/ACT inhaler Inhale 2 puffs into the lungs 2 (two) times daily.    [provider]  Calcium-Magnesium-Zinc (CAL-MAG-ZINC PO) Take by mouth daily.    [provider]  cholecalciferol (VITAMIN D) 1000 UNITS tablet Take 1,000 Units by mouth daily.    [provider]  cyclobenzaprine (FLEXERIL) 5 MG tablet TAKE 2 TABLETS BY MOUTH 3 TIMES DAILY AS NEEDED 07/30/17   [provider]  Dexlansoprazole 30 MG capsule Take 30 mg by mouth daily.    [provider]  diazepam (VALIUM) 2 MG tablet 1 tablet by mouth two times daily as needed for anxiety 10/20/18   Plovsky, Berneta Sages, MD  diazepam (VALIUM) 5 MG tablet 1  qhs 06/29/18   Plovsky, Berneta Sages, MD  doxepin (SINEQUAN) 50 MG capsule 1  qhs 06/29/18   Plovsky, Berneta Sages, MD  EMGALITY 120 MG/ML SOAJ 1 (ONE) SOLUTION AUTO INJECTOR MONTHLY 08/05/18   [provider]  flurazepam (DALMANE) 30 MG capsule Take 1 capsule (30 mg total) by mouth at bedtime as needed for sleep. 10/20/18   Plovsky, Berneta Sages, MD  fluticasone (FLONASE) 50 MCG/ACT nasal spray Place 1 spray into both nostrils daily.    [provider]  Fremanezumab-vfrm (AJOVY) 225 MG/1.5ML SOAJ Inject 225 mg into the skin every 30 (thirty) days. 09/14/18   Tomi Likens, Adam R, DO  Fremanezumab-vfrm (AJOVY) 225 MG/1.5ML SOAJ Inject 225 mg into the skin every 30 (thirty) days. 10/19/18   Pieter Partridge, DO  furosemide (LASIX) 20 MG tablet Take 20 mg by mouth.    [provider]  linaclotide (LINZESS) 145 MCG  CAPS capsule Take 145 mcg by mouth daily as needed (costipation).     [provider]  magic mouthwash SOLN Take 5 mLs by mouth 3 (three) times daily as needed for mouth pain.    [provider]  Multiple Vitamins-Minerals (MULTIVITAMIN WITH MINERALS) tablet Take 1 tablet by mouth every morning.     [provider]  NON FORMULARY 2 (two) times daily. [place]    [provider]  Omega-3 Fatty Acids (FISH OIL PO) Take by mouth 2 (two) times a day.    [provider]  oxyCODONE-acetaminophen (PERCOCET) 10-325 MG tablet Take 1 tablet by mouth 2 (two) times daily.    [provider]  PAZEO 0.7 % SOLN PLACE 1 DROP INTO BOTH EYES ONCE DAILY as needed for allergies 08/09/17   [provider]  potassium chloride SA (K-DUR,KLOR-CON) 20 MEQ tablet Take 20 mEq by mouth 2 (two) times daily.     [provider]  topiramate (TOPAMAX) 25 MG  tablet Take 25 mg by mouth 2 (two) times daily. 06/21/18   [provider]  triamterene-hydrochlorothiazide (DYAZIDE) 50-25 MG capsule Take 1 capsule by mouth daily.    [provider]  TURMERIC PO Take by mouth as needed.    [provider]  valACYclovir (VALTREX) 1000 MG tablet Take 1,000 mg by mouth daily.     [provider]  vitamin B-12 (CYANOCOBALAMIN) 1000 MCG tablet Take 1,000 mcg by mouth daily.    [provider]    Family History Family History  Problem Relation Age of Onset  . Coronary artery disease Father   . Cancer Father        head neck   . Esophageal cancer Father   . Hypertension Mother   . Schizophrenia Mother   . Depression Brother   . Prostate cancer Brother   . Anesthesia problems Neg Hx   . Hypotension Neg Hx   . Malignant hyperthermia Neg Hx   . Pseudochol deficiency Neg Hx   . Allergic rhinitis Neg Hx   . Angioedema Neg Hx   . Asthma Neg Hx   . Atopy Neg Hx   . Eczema Neg Hx   . Immunodeficiency Neg Hx   . Urticaria Neg  Hx   . Breast cancer Neg Hx     Social History Social History   Tobacco Use  . Smoking status: Current Some Day Smoker    Packs/day: 0.10    Years: 30.00    Pack years: 3.00    Types: Cigarettes  . Smokeless tobacco: Never Used  . Tobacco comment: using nicotrol inhaler  Substance Use Topics  . Alcohol use: No    Alcohol/week: 0.0 standard drinks  . Drug use: No     Allergies   Effexor [venlafaxine hydrochloride], Latex, Penicillins, Zithromax [azithromycin dihydrate], Amlodipine, Atorvastatin, Butrans [buprenorphine], Codeine, Nucynta [tapentadol], Vicodin [hydrocodone-acetaminophen], Chantix [varenicline tartrate], Paroxetine hcl, and Tramadol   Review of Systems Review of Systems  All other systems reviewed and are negative.    Physical Exam Updated Vital Signs BP (!) 147/83 (BP Location: Right Arm)   Pulse 76   Temp 98.6 F (37 C) (Oral)   Resp 18   SpO2 98%   Physical Exam Vitals signs and nursing note reviewed.  Constitutional:      General: She is not in acute distress.    Appearance: She is well-developed.  HENT:     Head: Normocephalic and atraumatic.     Right Ear: External ear normal.     Left Ear: External ear normal.  Eyes:     General: No scleral icterus.       Right eye: No discharge.        Left eye: No discharge.     Conjunctiva/sclera: Conjunctivae normal.  Neck:     Musculoskeletal: Neck supple.     Trachea: No tracheal deviation.  Cardiovascular:     Rate and Rhythm: Normal rate and regular rhythm.  Pulmonary:     Effort: Pulmonary effort is normal. No respiratory distress.     Breath sounds: Normal breath sounds. No stridor. No wheezing or rales.  Abdominal:     General: Bowel sounds are normal. There is no distension.     Palpations: Abdomen is soft.     Tenderness: There is no abdominal tenderness. There is no guarding or rebound.  Musculoskeletal:        General: No tenderness.  Skin:    General: Skin is warm and dry.  Findings: No rash.  Neurological:     Mental Status: She is alert.     Cranial Nerves: No cranial nerve deficit (no facial droop, extraocular movements intact, no slurred speech).     Sensory: No sensory deficit.     Motor: No abnormal muscle tone or seizure activity.     Coordination: Coordination normal.      ED Treatments / Results  Labs (all labs ordered are listed, but only abnormal results are displayed) Labs Reviewed - No data to display  EKG None  Radiology No results found.  Procedures Procedures (including critical care time)  Medications Ordered in ED Medications  prochlorperazine (COMPAZINE) injection 10 mg (10 mg Intramuscular Given 11/08/18 2158)  ketorolac (TORADOL) 30 MG/ML injection 30 mg (30 mg Intramuscular Given 11/08/18 2157)     Initial Impression / Assessment and Plan / ED Course  I have reviewed the triage vital signs and the nursing notes.  Pertinent labs & imaging results that were available during my care of the patient were reviewed by me and considered in my medical decision making (see chart for details).    Patient presented to emergency room with a migraine headache.  No focal neurologic deficits.  Symptoms are consistent with her typical migraine.  Her symptoms improved with a migraine cocktail.  Final Clinical Impressions(s) / ED Diagnoses   Final diagnoses:  Other migraine with status migrainosus, not intractable    ED Discharge Orders    None       Dorie Rank, MD 11/08/18 2224

## 2018-11-08 NOTE — Discharge Instructions (Addendum)
Follow-up with your primary care doctor or neurologist

## 2018-11-08 NOTE — Telephone Encounter (Signed)
Patient left msg with after hours about medication not getting refilled. Has current migraine related to stress. Asking for relief. Blurred vision and nausea/vomiting started over the weekend. Caller stated feels different from other headaches.

## 2018-11-08 NOTE — ED Triage Notes (Signed)
Pt states she has had a migraine for the last 3 months- pt states it seems to come and go for 3 months and has been under tremendous stress due to 2 sons one with mental health and one in trouble. Pt states she has hx of migraines and does not have her current migraine medication but states she really she would like to have her medication to help with her migraines.

## 2018-11-09 ENCOUNTER — Telehealth: Payer: Self-pay | Admitting: Neurology

## 2018-11-09 ENCOUNTER — Other Ambulatory Visit (HOSPITAL_COMMUNITY): Payer: Self-pay

## 2018-11-09 MED ORDER — SUMATRIPTAN SUCCINATE 100 MG PO TABS: 100.0000 mg | ORAL_TABLET | Freq: Once | ORAL | 1 refills | Status: DC | PRN

## 2018-11-09 NOTE — Telephone Encounter (Signed)
Called and spoke with Pt. We discussed again that she is unable to use the Ajovy to abort headaches and it can only be filled once a month, and has been approved until October 2020.  Pt would like the sumatriptan 100 mg I advised her of yesterday sent in. I went over dosing with her. She verbalized her understanding.  She stated again, she already has ondansetron.

## 2018-11-09 NOTE — Telephone Encounter (Signed)
Patient left msg with after hours about being at ER for migraine and couldn't wait any longer to be seen. Would like for her medication to be called in. This is her 2nd attempt at trying to get her medication.

## 2018-11-14 ENCOUNTER — Telehealth: Payer: Self-pay | Admitting: Neurology

## 2018-11-14 ENCOUNTER — Other Ambulatory Visit: Payer: Self-pay

## 2018-11-14 MED ORDER — ONDANSETRON HCL 8 MG PO TABS: 8.0000 mg | ORAL_TABLET | Freq: Three times a day (TID) | ORAL | 3 refills | Status: DC | PRN

## 2018-11-14 NOTE — Telephone Encounter (Signed)
Patient is calling in about needing something for migraine. That medication is making her sick to her stomach. She cant keep anything down and is unable to get out of bed. Thanks!

## 2018-11-14 NOTE — Telephone Encounter (Signed)
Called and spoke with pt. She said she was out of Zofran. The sumatriptan causes nausea. I advised her I sent in Rx for Zofran.  She wanted to make sure it was noted that she had 5 family members that had died from aneurysms.

## 2018-11-28 ENCOUNTER — Other Ambulatory Visit (HOSPITAL_COMMUNITY): Payer: Self-pay

## 2018-11-28 MED ORDER — DIAZEPAM 2 MG PO TABS: ORAL_TABLET | ORAL | 0 refills | Status: DC

## 2018-12-09 ENCOUNTER — Other Ambulatory Visit: Payer: Self-pay

## 2018-12-09 ENCOUNTER — Ambulatory Visit (INDEPENDENT_AMBULATORY_CARE_PROVIDER_SITE_OTHER): Payer: Medicaid Other | Admitting: Psychiatry

## 2018-12-09 DIAGNOSIS — F4323 Adjustment disorder with mixed anxiety and depressed mood: Secondary | ICD-10-CM

## 2018-12-09 MED ORDER — DIAZEPAM 2 MG PO TABS: ORAL_TABLET | ORAL | 0 refills | Status: DC

## 2018-12-09 MED ORDER — DIAZEPAM 5 MG PO TABS: ORAL_TABLET | ORAL | 3 refills | Status: DC
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
## 2018-12-15 NOTE — Progress Notes (Signed)
Dawnelle Renbarger (Key: [id number]) [phone] Need help? Call us at [phone] Status Shared Drug Ajovy 225MG /1.5ML auto-injectors Form [facility] Call-In Form To initiate the prior authorization process for this medication, please contact [facility] at [phone]. [phone] Original Claim Info 75

## 2018-12-20 ENCOUNTER — Encounter: Payer: Self-pay | Admitting: *Deleted

## 2018-12-20 ENCOUNTER — Telehealth: Payer: Self-pay | Admitting: *Deleted

## 2018-12-20 NOTE — Progress Notes (Signed)
Called [facility] [facility]  [phone] spoke with pharmacy Consuela interaction ID [id number] She said they do not "drop a PA" she had already had 3 injections which is why it would not go through at the pharmacy. She authorized 1 more Essica can fill today and we initiated the "continuation" PA  PA# [id number] She said to call in 24 hours to see if approved

## 2018-12-20 NOTE — Telephone Encounter (Signed)
Received a call from a pharmacy about a prescription prior authorization of Ajovy so I called patient to clarify because her prior authorization is good until 01/17/19. She is aware aof that and said it may be that the mail order pharmacy wants to send it to her but she picks it up at [facility]. So I tilod her I will let it go and if there are any problems for her to call me and I will help clarify. She agreed to call if any problems.

## 2018-12-27 ENCOUNTER — Telehealth: Payer: Self-pay | Admitting: Neurology

## 2018-12-27 NOTE — Telephone Encounter (Signed)
Patient called and left a message requesting a refill on the headache pain medicine she said she takes in between her injections. She did not leave the specific name of the medication in her message.

## 2018-12-29 ENCOUNTER — Other Ambulatory Visit: Payer: Self-pay | Admitting: Neurology

## 2018-12-29 MED ORDER — SUMATRIPTAN SUCCINATE 100 MG PO TABS: 100.0000 mg | ORAL_TABLET | Freq: Once | ORAL | 5 refills | Status: DC | PRN

## 2018-12-29 NOTE — Telephone Encounter (Signed)
Please verify specifically which medication

## 2018-12-29 NOTE — Telephone Encounter (Signed)
Rx Sumatriptan 100mg .-last refill 11/09/18                   Last seen 09/14/18

## 2018-12-29 NOTE — Telephone Encounter (Signed)
Sumatriptan refilled at [facility]

## 2019-01-09 NOTE — Telephone Encounter (Signed)
Sending to clinical staff for review: Okay to sign/close encounter or is further follow up needed? ° °

## 2019-01-12 ENCOUNTER — Other Ambulatory Visit (HOSPITAL_COMMUNITY): Payer: Self-pay

## 2019-01-12 ENCOUNTER — Other Ambulatory Visit (HOSPITAL_COMMUNITY): Payer: Self-pay | Admitting: Psychiatry

## 2019-01-12 MED ORDER — DIAZEPAM 2 MG PO TABS: ORAL_TABLET | ORAL | 0 refills | Status: DC

## 2019-01-12 MED ORDER — DIAZEPAM 5 MG PO TABS: ORAL_TABLET | ORAL | 0 refills | Status: DC

## 2019-01-12 MED ORDER — DOXEPIN HCL 50 MG PO CAPS: ORAL_CAPSULE | ORAL | 5 refills | Status: DC

## 2019-01-12 NOTE — Progress Notes (Signed)
NEUROLOGY FOLLOW UP OFFICE NOTE  DDNNA KOHR [id number]  HISTORY OF PRESENT ILLNESS: Gloria Lewis is a 63 year old right-handed woman with history of hypertension, prior drug and alcohol abuse, major depressive disorder, bipolar disorder, schizoaffective disorder, migraines, tension headache, cervical disc disease status post 2 surgeries, lumbar degenerative disc disease, osteoarthritis, prolapsed bladder, hypercholesterolemia, IBS, and GERDn who follows up for migraines.  UPDATE: Intensity:  8/10 Duration:  1 hour with sumatriptan Frequency:  1 to 2 days a week (only if laying down) Frequency of abortive medication: 1 to 2 days a week Current NSAIDS:  ASA 81mg  Current analgesics:  Excedrin, Percocet (for back and shoulder pain) Current triptans:  sumatriptan 100mg  Current ergotamine:  none Current anti-emetic:  Zofran Current muscle relaxants:  Flexeril Current anti-anxiolytic:  Valilum Current sleep aide:  none Current Antihypertensive medications:  Lasix, Dyazid Current Antidepressant medications:  none Current Anticonvulsant medications:  none Current anti-CGRP:  Ajovy  Current Vitamins/Herbal/Supplements:  B12 Current Antihistamines/Decongestants:  Sudafed, Zyrtec (not frequent) Other therapy:  none Hormone/birth control:  none  HISTORY: Onset:  Since childhood, but worse since 2009 Location:  Bi-temporal and at base of skull, neck pain sometimes radiating down into right shoulder Quality:  Constant severe aching, throbbing Initial Intensity:  8/10.  She denies new headache, thunderclap headache or severe headache that wakes her from sleep. Aura:  no Premonitory Phase:  no Postdrome:  no Associated symptoms:  Nausea, photophobia, phonophobia, osmophobia, blurred vision.  She denies associated unilateral numbness or weakness. Initial Duration:  Constant but eases if takes a Sudafed Initial Frequency:  Daily, severe 1 to 2 days a week Initial Frequency of  abortive medication: Sudafed daily, Excedrin one to twice a week Triggers:  Emotional stress, sleeping on right side (where she had cervical spine surgery), movement, neck pain, seasonal allergies, bright sunlight Relieving factors:  Laying down to rest in dark room Activity:Cannot function  Past NSAIDS:  Advil, Aleve Past analgesics:  Tylenol, Percocet Past abortive triptans:  none Past abortive ergotamine:  none Past muscle relaxants:  Flexeril, Robaxin, Zanflex Past anti-emetic:  Chlorpromazine HCL 25-50mg , metoclopramide 10mg , promethazine Past antihypertensive medications:  Propranolol (caused depression) Past antidepressant medications:  Nortriptyline (advised to stop due to chest pain), Zoloft (increased headaches), doxepin 50mg  Past anticonvulsant medications:  zonisamide 400mg , topiramate, Lyrica Past anti-CGRP:  Emgality Past vitamins/Herbal/Supplements:  MVI Past antihistamines/decongestants:  Flonase Other past therapies:  Physical therapy for neck, acupuncture, trigger point injections  PAST MEDICAL HISTORY: Past Medical History:  Diagnosis Date  . Anginal pain ([place])    admit 06/2014; had non-ischemic stress test  . Anxiety   . Bipolar 1 disorder ([place])   . Colon polyp   . CTS (carpal tunnel syndrome)   . Depression   . Diabetes mellitus without complication ([place])   . Fever blister   . GERD (gastroesophageal reflux disease)   . HA (headache)   . HTN (hypertension)   . Hypercholesterolemia   . Migraines   . OA (osteoarthritis)   . Schizo-affective psychosis ([place])     MEDICATIONS: Current Outpatient Medications on File Prior to Visit  Medication Sig Dispense Refill  . budesonide-formoterol (SYMBICORT) 160-4.5 MCG/ACT inhaler Inhale 2 puffs into the lungs 2 (two) times daily.    . Calcium-Magnesium-Zinc (CAL-MAG-ZINC PO) Take by mouth daily.    . cholecalciferol (VITAMIN D) 1000 UNITS tablet Take 1,000 Units by mouth daily.    . cyclobenzaprine (FLEXERIL) 5 MG  tablet TAKE 2 TABLETS BY MOUTH 3 TIMES DAILY  AS NEEDED  2  . Dexlansoprazole 30 MG capsule Take 30 mg by mouth daily.    . diazepam (VALIUM) 2 MG tablet 1 tablet by mouth two times daily as needed for anxiety 60 tablet 0  . diazepam (VALIUM) 5 MG tablet 1  qhs 30 tablet 3  . doxepin (SINEQUAN) 50 MG capsule 1  qhs 30 capsule 5  . EMGALITY 120 MG/ML SOAJ 1 (ONE) SOLUTION AUTO INJECTOR MONTHLY    . flurazepam (DALMANE) 30 MG capsule Take 1 capsule (30 mg total) by mouth at bedtime as needed for sleep. 30 capsule 0  . fluticasone (FLONASE) 50 MCG/ACT nasal spray Place 1 spray into both nostrils daily.    . Fremanezumab-vfrm (AJOVY) 225 MG/1.5ML SOAJ Inject 225 mg into the skin every 30 (thirty) days. 1 pen 11  . Fremanezumab-vfrm (AJOVY) 225 MG/1.5ML SOAJ Inject 225 mg into the skin every 30 (thirty) days. 1 pen 11  . furosemide (LASIX) 20 MG tablet Take 20 mg by mouth.    . linaclotide (LINZESS) 145 MCG CAPS capsule Take 145 mcg by mouth daily as needed (costipation).     . magic mouthwash SOLN Take 5 mLs by mouth 3 (three) times daily as needed for mouth pain.    . Multiple Vitamins-Minerals (MULTIVITAMIN WITH MINERALS) tablet Take 1 tablet by mouth every morning.     . NON FORMULARY 2 (two) times daily. [place]    . Omega-3 Fatty Acids (FISH OIL PO) Take by mouth 2 (two) times a day.    . ondansetron (ZOFRAN) 8 MG tablet Take 1 tablet (8 mg total) by mouth every 8 (eight) hours as needed for nausea or vomiting. 20 tablet 3  . oxyCODONE-acetaminophen (PERCOCET) 10-325 MG tablet Take 1 tablet by mouth 2 (two) times daily.    Marland Kitchen PAZEO 0.7 % SOLN PLACE 1 DROP INTO BOTH EYES ONCE DAILY as needed for allergies  3  . potassium chloride SA (K-DUR,KLOR-CON) 20 MEQ tablet Take 20 mEq by mouth 2 (two) times daily.     . SUMAtriptan (IMITREX) 100 MG tablet Take 1 tablet (100 mg total) by mouth once as needed for up to 1 dose for migraine. May repeat in 2 hrs PRN. No more than 2 in 24 hrs. 10 tablet 5  .  topiramate (TOPAMAX) 25 MG tablet Take 25 mg by mouth 2 (two) times daily.    Marland Kitchen triamterene-hydrochlorothiazide (DYAZIDE) 50-25 MG capsule Take 1 capsule by mouth daily.    . TURMERIC PO Take by mouth as needed.    . valACYclovir (VALTREX) 1000 MG tablet Take 1,000 mg by mouth daily.     . vitamin B-12 (CYANOCOBALAMIN) 1000 MCG tablet Take 1,000 mcg by mouth daily.     No current facility-administered medications on file prior to visit.     ALLERGIES: Allergies  Allergen Reactions  . Effexor [Venlafaxine Hydrochloride] Itching and Other (See Comments)    headache  . Latex Itching and Rash  . Penicillins Hives    Has patient had a PCN reaction causing immediate rash, facial/tongue/throat swelling, SOB or lightheadedness with hypotension: Yes Has patient had a PCN reaction causing severe rash involving mucus membranes or skin necrosis: No Has patient had a PCN reaction that required hospitalization No Has patient had a PCN reaction occurring within the last 10 years: No If all of the above answers are "NO", then may proceed with Cephalosporin use.   . Zithromax [Azithromycin Dihydrate] Swelling  . Amlodipine Swelling  . Atorvastatin Swelling  .  Butrans [Buprenorphine] Other (See Comments)    Ulcers-"mouth would not heal"  . Codeine Itching    Tolerable with benadryl  . Nucynta [Tapentadol] Other (See Comments)    Ulcers inside of mouth  . Vicodin [Hydrocodone-Acetaminophen] Itching  . Chantix [Varenicline Tartrate] Nausea Only  . Paroxetine Hcl Other (See Comments)    headache  . Tramadol Other (See Comments)    Pt states it interacted with her sertraline, but she is no longer on sertraline.  She does not remember the type of reaction she had.     FAMILY HISTORY: Family History  Problem Relation Age of Onset  . Coronary artery disease Father   . Cancer Father        head neck   . Esophageal cancer Father   . Hypertension Mother   . Schizophrenia Mother   . Depression  Brother   . Prostate cancer Brother   . Anesthesia problems Neg Hx   . Hypotension Neg Hx   . Malignant hyperthermia Neg Hx   . Pseudochol deficiency Neg Hx   . Allergic rhinitis Neg Hx   . Angioedema Neg Hx   . Asthma Neg Hx   . Atopy Neg Hx   . Eczema Neg Hx   . Immunodeficiency Neg Hx   . Urticaria Neg Hx   . Breast cancer Neg Hx     SOCIAL HISTORY: Social History   Socioeconomic History  . Marital status: Single    Spouse name: Not on file  . Number of children: 3  . Years of education: Not on file  . Highest education level: Some college, no degree  Occupational History  . Occupation: disabled/retired  Social Needs  . Financial resource strain: Somewhat hard  . Food insecurity    Worry: Sometimes true    Inability: Sometimes true  . Transportation needs    Medical: Yes    Non-medical: Yes  Tobacco Use  . Smoking status: Current Some Day Smoker    Packs/day: 0.10    Years: 30.00    Pack years: 3.00    Types: Cigarettes  . Smokeless tobacco: Never Used  . Tobacco comment: using nicotrol inhaler  Substance and Sexual Activity  . Alcohol use: No    Alcohol/week: 0.0 standard drinks  . Drug use: No  . Sexual activity: Never  Lifestyle  . Physical activity    Days per week: 0 days    Minutes per session: 0 min  . Stress: Rather much  Relationships  . Social connections    Talks on phone: More than three times a week    Gets together: More than three times a week    Attends religious service: More than 4 times per year    Active member of club or organization: Yes    Attends meetings of clubs or organizations: More than 4 times per year    Relationship status: Never married  . Intimate partner violence    Fear of current or ex partner: No    Emotionally abused: No    Physically abused: No    Forced sexual activity: No  Other Topics Concern  . Not on file  Social History Narrative   Lives in a two story home    REVIEW OF SYSTEMS: Constitutional:  No fevers, chills, or sweats, no generalized fatigue, change in appetite Eyes: No visual changes, double vision, eye pain Ear, nose and throat: No hearing loss, ear pain, nasal congestion, sore throat Cardiovascular: No chest pain, palpitations Respiratory:  No shortness of breath at rest or with exertion, wheezes GastrointestinaI: No nausea, vomiting, diarrhea, abdominal pain, fecal incontinence Genitourinary:  No dysuria, urinary retention or frequency Musculoskeletal:  No neck pain, back pain Integumentary: No rash, pruritus, skin lesions Neurological: as above Psychiatric: No depression, insomnia, anxiety Endocrine: No palpitations, fatigue, diaphoresis, mood swings, change in appetite, change in weight, increased thirst Hematologic/Lymphatic:  No purpura, petechiae. Allergic/Immunologic: no itchy/runny eyes, nasal congestion, recent allergic reactions, rashes  PHYSICAL EXAM: Blood pressure 116/86, pulse (!) 118, temperature 99 F (37.2 C), height 5\' 7"  (1.702 m), weight 235 lb (106.6 kg), SpO2 98 %. General: No acute distress.  Patient appears well-groomed.   Head:  Normocephalic/atraumatic Eyes:  Fundi examined but not visualized Neck: supple, no paraspinal tenderness, full range of motion Heart:  Regular rate and rhythm Lungs:  Clear to auscultation bilaterally Back: No paraspinal tenderness Neurological Exam: alert and oriented to person, place, and time. Attention span and concentration intact, recent and remote memory intact, fund of knowledge intact.  Speech fluent and not dysarthric, language intact.  CN II-XII intact. Bulk and tone normal, muscle strength 5/5 throughout.  Sensation to light touch  intact.  Deep tendon reflexes 2+ throughout.  Finger to nose testing intact.  Gait normal, Romberg negative.  IMPRESSION: Migraine without aura, without status migrainosus, not intractable  PLAN: 1.  For preventative management, Ajovy 2.  For abortive therapy, sumatriptan  (Zofran for nausea) 3.  Limit use of pain relievers to no more than 2 days out of week to prevent risk of rebound or medication-overuse headache. 4.  Keep headache diary 5.  Exercise, hydration, caffeine cessation, sleep hygiene, monitor for and avoid triggers 6.  Consider:  magnesium citrate 400mg  daily, riboflavin 400mg  daily, and coenzyme Q10 100mg  three times daily 7. Always keep in mind that currently taking a hormone or birth control may be a possible trigger or aggravating factor for migraine. 8. Follow up 6 months.   Metta Clines, DO  CC:  Harlan Stains, MD

## 2019-01-16 ENCOUNTER — Other Ambulatory Visit: Payer: Self-pay

## 2019-01-16 ENCOUNTER — Encounter: Payer: Self-pay | Admitting: Neurology

## 2019-01-16 ENCOUNTER — Ambulatory Visit: Payer: Medicaid Other | Admitting: Neurology

## 2019-01-16 VITALS — BP 116/86 | HR 118 | Temp 99.0°F | Ht 67.0 in | Wt 235.0 lb

## 2019-01-16 DIAGNOSIS — G43009 Migraine without aura, not intractable, without status migrainosus: Secondary | ICD-10-CM

## 2019-01-16 NOTE — Patient Instructions (Signed)
1.  Continue Ajovy 2.  at earliest onset of migraine, take one sumatriptan tablet.  May repeat after 2 hours if needed (maximum 2 tablets in 24 hours).  Use ondansetron (Zofran) for nausea. 3.  Limit use of pain relievers to no more than 2 days out of week to prevent risk of rebound or medication-overuse headache. 4.  Keep headache diary 5.  Exercise, hydration, caffeine cessation, sleep hygiene, monitor for and avoid triggers 6.  Consider:  magnesium citrate 400mg  daily, riboflavin 400mg  daily, and coenzyme Q10 100mg  three times daily 7. Follow up 6 months.

## 2019-02-03 ENCOUNTER — Other Ambulatory Visit: Payer: Self-pay

## 2019-02-03 ENCOUNTER — Ambulatory Visit (INDEPENDENT_AMBULATORY_CARE_PROVIDER_SITE_OTHER): Payer: Medicaid Other | Admitting: Psychiatry

## 2019-02-03 DIAGNOSIS — F4323 Adjustment disorder with mixed anxiety and depressed mood: Secondary | ICD-10-CM | POA: Diagnosis not present

## 2019-02-03 MED ORDER — DIAZEPAM 5 MG PO TABS: ORAL_TABLET | ORAL | 0 refills | Status: DC

## 2019-02-03 MED ORDER — DIAZEPAM 5 MG PO TABS: ORAL_TABLET | ORAL | 2 refills | Status: DC

## 2019-02-03 MED ORDER — DIAZEPAM 2 MG PO TABS: ORAL_TABLET | ORAL | 2 refills | Status: DC

## 2019-02-03 MED ORDER — NORTRIPTYLINE HCL 50 MG PO CAPS: ORAL_CAPSULE | ORAL | 2 refills | Status: DC

## 2019-02-03 NOTE — Progress Notes (Signed)
Patient ID: Gloria Lewis, female   DOB: 10/16/1955, 63 y.o.   MRN: [id number] [facility] MD Progress Note  02/03/2019 11:05 AM MAKINA SHOOTS  MRN:  [id number] Subjective:  Shoulder hurting Principal Problem: Major Depression,recurent Mild Diagnosis: Adjustment disorder with an anxious mood state  Today the patient is doing her baseline.  She continues to have chaotic issues with her family.  She has 2 sons.  One has been violent with her and is a chronic schizophrenic the other one who is living in [place] right now is in trouble there is asking the patient to help him.  The patient has somewhat of a chaotic living environment but that actually seems to have settled down.  The patient is sleeping and eating fairly well.  Unfortunately she has a lot of problem with her right shoulder and arm.  She has had some medical complications regarding this.  On the other hand the patient denies chest pain or shortness of breath or any fever.  The patient has a distant history of substance abuse at this time denies the use of alcohol or drugs.  Today we talked about using nortriptyline to help her sleep and may also have an effect on helping her pain.  Patient Active Problem List   Diagnosis Date Noted  . Infectious gastroenteritis [A09] 04/16/2016  . Xerostomia [K11.7] 03/09/2016  . Surgery, elective [Z41.9] 05/23/2015  . S/P arthroscopy of shoulder [Z98.890] 05/23/2015  . Chronic migraine without aura without status migrainosus, not intractable [G43.709] 10/18/2014  . Tobacco abuse [Z72.0] 10/18/2014  . Obesity [E66.9] 09/23/2014  . COPD [J44.9] 09/02/2014  . Pulmonary hypertension ([place]) [I27.20] 08/31/2014  . Respiratory failure with hypoxia ([place]) [J96.91] 08/14/2014  . Cigarette smoker [F17.210] 07/28/2014  . Major depressive disorder, recurrent episode, moderate ([place]) [F33.1] 07/06/2014  . Essential hypertension [I10]   . SOB (shortness of breath) [R06.02] 06/21/2014  . Precordial pain [R07.2]  06/21/2014  . Gastroesophageal reflux disease [K21.9] 06/21/2014  . Chest pain [R07.9] 06/21/2014  . HTN (hypertension) [I10]   . Neck pain [M54.2] 01/08/2014  . Major depressive disorder, recurrent episode, severe, without mention of psychotic behavior [F33.2] 10/14/2012  . Schizoaffective disorder ([place]) [F25.9] 05/26/2012  . 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 Hyperparathyroidism, primary ([place]) [E21.0] 10/06/2010  . DEGENERATIVE DISC DISEASE, LUMBOSACRAL SPINE [M51.37] 05/21/2007  . DERMATOPHYTOSIS OF THE BODY [B35.4] 05/10/2007  . 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 Anxiety state [F41.1] 03/24/2007  . DENTAL PAIN [K08.9] 03/24/2007  . SHOULDER PAIN, LEFT [M25.519] 03/24/2007   Total Time spent with patient:30 min  Past Psychiatric History:   Past Medical History:  Past Medical History:  Diagnosis Date  . Anginal pain ([place])    admit 06/2014; had non-ischemic stress test  . Anxiety   . Bipolar 1 disorder ([place])   . Colon polyp   . CTS (carpal tunnel syndrome)   . Depression   . Diabetes mellitus without complication ([place])   . Fever blister   . GERD (gastroesophageal reflux disease)   . HA (headache)   . HTN (hypertension)   . Hypercholesterolemia   . Migraines   . OA (osteoarthritis)   . Schizo-affective psychosis ([place])     Past Surgical History:  Procedure Laterality Date  . ANTERIOR CERVICAL DECOMP/DISCECTOMY FUSION  08/27/2011   Procedure: ANTERIOR CERVICAL DECOMPRESSION/DISCECTOMY FUSION 1 LEVEL/HARDWARE REMOVAL;  Surgeon: Eustace Moore, MD;  Location: [facility];  Service: Neurosurgery;  Laterality: Bilateral;  Cervical four-five Anterior cervical decompression/diskectomy, fusion,  Plate, Removal of Cervical five-seven Plate  . back injection    . CARDIAC CATHETERIZATION N/A 11/09/2014   Procedure: Right Heart Cath;  Surgeon: Larey Dresser, MD;  Location: [facility];  Service: Cardiovascular;  Laterality: N/A;  . CARPAL TUNNEL RELEASE   20110 rt/lt   rt x2 , lt x1  . COLONOSCOPY  06/2017   [facility]  . ESOPHAGOGASTRODUODENOSCOPY  06/2017   [facility]  . HEMORRHOID SURGERY    . MULTIPLE TOOTH EXTRACTIONS    . NECK SURGERY  2009  . PITUITARY SURGERY     Had gland removed from producing too much calcium  . polp removed  2011  . RIGHT/LEFT HEART CATH AND CORONARY ANGIOGRAPHY N/A 07/05/2017   Procedure: RIGHT/LEFT HEART CATH AND CORONARY ANGIOGRAPHY;  Surgeon: Larey Dresser, MD;  Location: [facility];  Service: Cardiovascular;  Laterality: N/A;  . SHOULDER ARTHROSCOPY WITH ROTATOR CUFF REPAIR Right 05/23/2015   Procedure: RIGHT SHOULDER ARTHROSCOPY WITH REMOVAL OF SUTURE ANCHOR AND POSSIBLE REVISION ROTATOR CUFF REPAIR;  Surgeon: Justice Britain, MD;  Location: [facility];  Service: Orthopedics;  Laterality: Right;  . SHOULDER ARTHROSCOPY WITH SUBACROMIAL DECOMPRESSION Right 01/24/2015   Procedure: RIGHT SHOULDER ARTHROSCOPY WITH SUBACROMIAL DECOMPRESSION AD DISTAL CLAVICLE RESECTION ;  Surgeon: Justice Britain, MD;  Location: [facility];  Service: Orthopedics;  Laterality: Right;  Marland Kitchen VAGINAL DELIVERY     x3   Family History:  Family History  Problem Relation Age of Onset  . Coronary artery disease Father   . Cancer Father        head neck   . Esophageal cancer Father   . Hypertension Mother   . Schizophrenia Mother   . Depression Brother   . Prostate cancer Brother   . Anesthesia problems Neg Hx   . Hypotension Neg Hx   . Malignant hyperthermia Neg Hx   . Pseudochol deficiency Neg Hx   . Allergic rhinitis Neg Hx   . Angioedema Neg Hx   . Asthma Neg Hx   . Atopy Neg Hx   . Eczema Neg Hx   . Immunodeficiency Neg Hx   . Urticaria Neg Hx   . Breast cancer Neg Hx    Family Psychiatric  History:  Social History:  Social History   Substance and Sexual Activity  Alcohol Use No  . Alcohol/week: 0.0 standard drinks     Social History   Substance and Sexual Activity  Drug Use No    Social  History   Socioeconomic History  . Marital status: Single    Spouse name: Not on file  . Number of children: 3  . Years of education: Not on file  . Highest education level: Some college, no degree  Occupational History  . Occupation: disabled/retired  Social Needs  . Financial resource strain: Somewhat hard  . Food insecurity    Worry: Sometimes true    Inability: Sometimes true  . Transportation needs    Medical: Yes    Non-medical: Yes  Tobacco Use  . Smoking status: Current Some Day Smoker    Packs/day: 0.10    Years: 30.00    Pack years: 3.00    Types: Cigarettes  . Smokeless tobacco: Never Used  Substance and Sexual Activity  . Alcohol use: No    Alcohol/week: 0.0 standard drinks  . Drug use: No  . Sexual activity: Never  Lifestyle  . Physical activity    Days per week: 0 days  Minutes per session: 0 min  . Stress: Rather much  Relationships  . Social connections    Talks on phone: More than three times a week    Gets together: More than three times a week    Attends religious service: More than 4 times per year    Active member of club or organization: Yes    Attends meetings of clubs or organizations: More than 4 times per year    Relationship status: Never married  Other Topics Concern  . Not on file  Social History Narrative   Lives in a two story home alone      [facility] [place] [facility]      Right handed   12th grade      Caffeine coffee 1 cup /day   Additional Social History:                         Sleep: Good  Appetite:  Fair  Current Medications: Current Outpatient Medications  Medication Sig Dispense Refill  . atorvastatin (LIPITOR) 10 MG tablet Take 10 mg by mouth every evening.    . budesonide-formoterol (SYMBICORT) 160-4.5 MCG/ACT inhaler Inhale 2 puffs into the lungs 2 (two) times daily.    . Calcium-Magnesium-Zinc (CAL-MAG-ZINC PO) Take by mouth daily.    . cholecalciferol (VITAMIN D) 1000 UNITS  tablet Take 1,000 Units by mouth daily.    . cyclobenzaprine (FLEXERIL) 5 MG tablet TAKE 2 TABLETS BY MOUTH 3 TIMES DAILY AS NEEDED  2  . Dexlansoprazole 30 MG capsule Take 30 mg by mouth daily.    . diazepam (VALIUM) 2 MG tablet 1 tablet by mouth two times daily as needed for anxiety 60 tablet 2  . diazepam (VALIUM) 5 MG tablet 1  qhs 30 tablet 2  . doxepin (SINEQUAN) 50 MG capsule 1  qhs (Patient not taking: Reported on 01/16/2019) 30 capsule 5  . flurazepam (DALMANE) 30 MG capsule Take 1 capsule (30 mg total) by mouth at bedtime as needed for sleep. (Patient not taking: Reported on 01/16/2019) 30 capsule 0  . fluticasone (FLONASE) 50 MCG/ACT nasal spray Place 1 spray into both nostrils daily.    . Fremanezumab-vfrm (AJOVY) 225 MG/1.5ML SOAJ Inject 225 mg into the skin every 30 (thirty) days. 1 pen 11  . furosemide (LASIX) 20 MG tablet Take 20 mg by mouth.    . linaclotide (LINZESS) 145 MCG CAPS capsule Take 145 mcg by mouth daily as needed (costipation).     . magic mouthwash SOLN Take 5 mLs by mouth 3 (three) times daily as needed for mouth pain.    . Multiple Vitamins-Minerals (MULTIVITAMIN WITH MINERALS) tablet Take 1 tablet by mouth every morning.     . NON FORMULARY 2 (two) times daily. [place]    . nortriptyline (PAMELOR) 50 MG capsule 1 qhs 30 capsule 2  . Omega-3 Fatty Acids (FISH OIL PO) Take by mouth 2 (two) times a day.    . ondansetron (ZOFRAN) 8 MG tablet Take 1 tablet (8 mg total) by mouth every 8 (eight) hours as needed for nausea or vomiting. 20 tablet 3  . oxyCODONE-acetaminophen (PERCOCET) 10-325 MG tablet Take 1 tablet by mouth 2 (two) times daily.    . pantoprazole (PROTONIX) 40 MG tablet Take 40 mg by mouth daily.    Marland Kitchen PAZEO 0.7 % SOLN PLACE 1 DROP INTO BOTH EYES ONCE DAILY as needed for allergies  3  . potassium chloride SA (  K-DUR,KLOR-CON) 20 MEQ tablet Take 20 mEq by mouth 2 (two) times daily.     . SUMAtriptan (IMITREX) 100 MG tablet Take 1 tablet (100 mg total) by  mouth once as needed for up to 1 dose for migraine. May repeat in 2 hrs PRN. No more than 2 in 24 hrs. (Patient not taking: Reported on 01/16/2019) 10 tablet 5  . topiramate (TOPAMAX) 25 MG tablet Take 25 mg by mouth 2 (two) times daily.    Marland Kitchen triamterene-hydrochlorothiazide (DYAZIDE) 50-25 MG capsule Take 1 capsule by mouth daily.    . TURMERIC PO Take by mouth as needed.    . valACYclovir (VALTREX) 1000 MG tablet Take 1,000 mg by mouth daily.     . vitamin B-12 (CYANOCOBALAMIN) 1000 MCG tablet Take 1,000 mcg by mouth daily.     No current facility-administered medications for this visit.     Lab Results: No results found for this or any previous visit (from the past 48 hour(s)).  Physical Findings: AIMS:  , ,  ,  ,    CIWA:    COWS:     Musculoskeletal: Strength & Muscle Tone: within normal limits Gait & Station: normal Patient leans: N/A  Psychiatric Specialty Exam: ROS  There were no vitals taken for this visit.There is no height or weight on file to calculate BMI.  General Appearance: Casual  Eye Contact::  Good  Speech:  Clear and Coherent  Volume:  Normal  Mood:  Euthymic  Affect:  Congruent  Thought Process:  Coherent  Orientation:  Full (Time, Place, and Person)  Thought Content:  WDL  Suicidal Thoughts:  No  Homicidal Thoughts:  No  Memory:  NA  Judgement:  Good  Insight:  Fair  Psychomotor Activity:  Normal  Concentration:  Fair  Recall:  Good  Fund of Knowledge:Good  Language: Good  Akathisia:  No  Handed:  Right  AIMS (if indicated):     Assets:   ADL's:  Intact  Cognition: WNL  Sleep:       Treatment Plan  02/03/2019, 11:05 AM  This patient is #1 problem is an adjustment disorder with an anxious mood state.  At this time we will continue the 5 mg of Valium and also 2 mg 2 as needed as needed.  Ultimately once she is in therapy I will consider reducing and discontinuing her Valium.  For now is noted the patient has been psychiatrically hospitalized  2 times in the past, the last time was in 2003 when she was abusing substances.  At this time it is reasonable to start her back on nortriptyline the outside chance that she likely was being treated for major depression.  I have amitriptyline in the past and it seems to been helpful.  If nothing else it will help her sleep better.  So she will continue taking Valium and nortriptyline and return to see me ideally in person in 2 months.  We will also attempt to get her into therapy with one of our therapist.

## 2019-02-21 ENCOUNTER — Ambulatory Visit (HOSPITAL_COMMUNITY): Payer: Medicaid Other | Admitting: Licensed Clinical Social Worker

## 2019-02-28 ENCOUNTER — Other Ambulatory Visit (HOSPITAL_COMMUNITY): Payer: Self-pay | Admitting: *Deleted

## 2019-02-28 ENCOUNTER — Telehealth (HOSPITAL_COMMUNITY): Payer: Self-pay | Admitting: *Deleted

## 2019-02-28 NOTE — Telephone Encounter (Signed)
This can definitely wait until Dr. Casimiro Needle is back in the office (non urgent).

## 2019-02-28 NOTE — Telephone Encounter (Signed)
Pt is requesting a letter allowing her to have an emotional support animal. I do not see any discussion of such in Dr Plovsky's last note. Please review and advise.

## 2019-03-15 ENCOUNTER — Other Ambulatory Visit (HOSPITAL_COMMUNITY): Payer: Self-pay | Admitting: *Deleted

## 2019-03-15 ENCOUNTER — Telehealth (HOSPITAL_COMMUNITY): Payer: Self-pay | Admitting: *Deleted

## 2019-03-15 NOTE — Telephone Encounter (Signed)
Pt has called c/o there was a murder/suicide in her complex. Says she saw the body and also has neighbors who are "bi-polar schizophrenics". Pt stating that the valium is "not working" and requesting a medication change. Please review and advise.   diazepam (VALIUM) 2 MG tablet  2 ordered        Summary: 1 tablet by mouth two times daily as needed for anxiety, Normal     diazepam (VALIUM) 5 MG tablet  2 ordered       Summary: 1 qhs, Normal

## 2019-04-19 ENCOUNTER — Other Ambulatory Visit: Payer: Self-pay

## 2019-04-19 ENCOUNTER — Ambulatory Visit (INDEPENDENT_AMBULATORY_CARE_PROVIDER_SITE_OTHER): Payer: Medicaid Other | Admitting: Psychiatry

## 2019-04-19 DIAGNOSIS — F325 Major depressive disorder, single episode, in full remission: Secondary | ICD-10-CM

## 2019-04-19 MED ORDER — DIAZEPAM 2 MG PO TABS: ORAL_TABLET | ORAL | 2 refills | Status: DC

## 2019-04-19 MED ORDER — DIAZEPAM 5 MG PO TABS: ORAL_TABLET | ORAL | 2 refills | Status: DC

## 2019-04-19 MED ORDER — NORTRIPTYLINE HCL 50 MG PO CAPS: ORAL_CAPSULE | ORAL | 2 refills | Status: DC
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
## 2019-05-05 ENCOUNTER — Telehealth: Payer: Self-pay | Admitting: Neurology

## 2019-05-05 NOTE — Telephone Encounter (Signed)
Please advise 

## 2019-05-05 NOTE — Telephone Encounter (Signed)
Left message to call back with symptoms

## 2019-05-05 NOTE — Telephone Encounter (Signed)
We didn't make any changes to her medications last visit.   Is she saying that the monthly shot isn't working (how many headaches a month is she having)? Or is she saying that her rescue medication is not working?

## 2019-05-05 NOTE — Telephone Encounter (Signed)
The following message was left with [facility] on 04/08/19 at 12:08 pm.  Caller states she is given medication for migraines, caller states the new medication is not working. Caller states she used to take Zolagram and she's wondering if there is something that can be given so they can just get through the week.  [facility]

## 2019-05-12 ENCOUNTER — Telehealth: Payer: Self-pay | Admitting: Neurology

## 2019-05-12 NOTE — Telephone Encounter (Signed)
There is no higher dose for Ajovy.  We can change to a different monthly injection, namely Aimovig 140mg  every 28 days.

## 2019-05-12 NOTE — Telephone Encounter (Signed)
Please advise 

## 2019-05-12 NOTE — Telephone Encounter (Signed)
Left message for patient to call and let us know what she wanted changed and to confirm pharmacy

## 2019-05-12 NOTE — Telephone Encounter (Signed)
Patient called in regarding her Ajovy medication. She said that she is having headaches everyday. She would like to know if her Ajovy medication could be increased instead of her taking her Rescue medication. [facility] [address] Please Call. Thank you

## 2019-05-12 NOTE — Telephone Encounter (Signed)
Has this been completed?  Sending to clinical staff for review: Okay to sign/close encounter or is further follow up needed? 

## 2019-05-12 NOTE — Telephone Encounter (Signed)
He never called back, go ahead and close out

## 2019-05-18 ENCOUNTER — Telehealth: Payer: Self-pay | Admitting: Neurology

## 2019-05-18 NOTE — Telephone Encounter (Signed)
Patient is calling in about a rash an hour after she started the Ajovy. She has tried everything to get rid of it that was instructed and nothing is helping. Wanting to see if she can get some med. Pharm on file correct. Thanks!

## 2019-05-18 NOTE — Telephone Encounter (Signed)
Advised patient to try benadryl, she says she already does this.  Also suggested patient go to urgent care and she refused.

## 2019-05-18 NOTE — Telephone Encounter (Signed)
All I can suggest is to take OTC Benadryl.  Otherwise, if she is concerned then she should go to Urgent Care.

## 2019-06-22 ENCOUNTER — Telehealth (HOSPITAL_COMMUNITY): Payer: Self-pay

## 2019-06-22 ENCOUNTER — Telehealth (HOSPITAL_COMMUNITY): Payer: Self-pay | Admitting: *Deleted

## 2019-06-22 ENCOUNTER — Telehealth: Payer: Self-pay | Admitting: Neurology

## 2019-06-22 NOTE — Telephone Encounter (Signed)
Pt has called numerous times today c/o increased anxiety and decreased sleep. Pt states that she's talking to herself. Pt asking for medication change? Pt has an upcoming appointment on 07/21/19. Please review and advise.

## 2019-06-22 NOTE — Telephone Encounter (Signed)
Left message to call back  

## 2019-06-22 NOTE — Telephone Encounter (Signed)
Patient lmom needing to speak with the nurse. She said that she would like a call back today. Thank you
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
## 2019-06-23 ENCOUNTER — Other Ambulatory Visit: Payer: Self-pay | Admitting: Family Medicine

## 2019-06-23 DIAGNOSIS — Z8249 Family history of ischemic heart disease and other diseases of the circulatory system: Secondary | ICD-10-CM

## 2019-06-23 DIAGNOSIS — R519 Headache, unspecified: Secondary | ICD-10-CM

## 2019-06-27 ENCOUNTER — Telehealth (HOSPITAL_COMMUNITY): Payer: Self-pay | Admitting: *Deleted

## 2019-06-27 ENCOUNTER — Other Ambulatory Visit (HOSPITAL_COMMUNITY): Payer: Self-pay | Admitting: Psychiatry
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
## 2019-06-28 ENCOUNTER — Telehealth (HOSPITAL_COMMUNITY): Payer: Self-pay | Admitting: *Deleted

## 2019-06-28 ENCOUNTER — Other Ambulatory Visit (HOSPITAL_COMMUNITY): Payer: Self-pay | Admitting: *Deleted

## 2019-06-28 MED ORDER — MIRTAZAPINE 30 MG PO TABS: 30.0000 mg | ORAL_TABLET | Freq: Every day | ORAL | 2 refills | Status: DC

## 2019-06-28 NOTE — Telephone Encounter (Signed)
Writer left VM for pt making her aware that Remeron 30mg  qhs, has been sent to [facility] pharmacy [facility] [address]. Pt instructed to return call with any questions or concerns.

## 2019-07-13 ENCOUNTER — Ambulatory Visit: Payer: Medicaid Other | Attending: Internal Medicine

## 2019-07-13 DIAGNOSIS — Z23 Encounter for immunization: Secondary | ICD-10-CM

## 2019-07-13 NOTE — Progress Notes (Signed)
   Covid-19 Vaccination Clinic  Name:  Gloria Lewis    MRN: [id number] DOB: 1956/02/13  07/13/2019  [name] was observed post Covid-19 immunization for 15 minutes without incident. She was provided with Vaccine Information Sheet and instruction to access the V-Safe system.   [name] was instructed to call 911 with any severe reactions post vaccine: Marland Kitchen Difficulty breathing  . Swelling of face and throat  . A fast heartbeat  . A bad rash all over body  . Dizziness and weakness   Immunizations Administered    Name Date Dose VIS Date Route   Pfizer COVID-19 Vaccine 07/13/2019  8:36 AM 0.3 mL 03/17/2019 Intramuscular   Manufacturer: [employer], [employer]   Lot: [id number]   [place]: [id number]

## 2019-07-17 NOTE — Progress Notes (Unsigned)
Virtual Visit via Video Note The purpose of this virtual visit is to provide medical care while limiting exposure to the novel coronavirus.    Consent was obtained for video visit:  Yes.   Answered questions that patient had about telehealth interaction:  Yes.   I discussed the limitations, risks, security and privacy concerns of performing an evaluation and management service by telemedicine. I also discussed with the patient that there may be a patient responsible charge related to this service. The patient expressed understanding and agreed to proceed.  Pt location: Home Physician Location: office Name of referring provider:  Harlan Stains, MD I connected with Gloria Lewis at patients initiation/request on 07/19/2019 at  9:50 AM EDT by video enabled telemedicine application and verified that I am speaking with the correct person using two identifiers. Pt MRN:  [id number] Pt DOB:  12/28/1955 Video Participants:  Gloria Lewis   History of Present Illness:  Gloria Lewis is a 64 year old right-handed womanwith history of hypertension, prior drug and alcohol abuse, major depressive disorder, bipolar disorder, schizoaffective disorder, migraines, tension headache, cervical disc disease status post 2 surgeries, lumbar degenerative disc disease, osteoarthritis, prolapsed bladder, hypercholesterolemia, IBS, and GERDn who follows up for migraines.  UPDATE: She started having increased headaches in January.  *** Intensity:  8/10 Duration:  1 hour with sumatriptan Frequency:  1 to 2 days a week (only if laying down) Frequency of abortive medication: 1 to 2 days a week Current NSAIDS:ASA 81mg  Current analgesics:Excedrin,Percocet(for back and shoulder pain) Current triptans:sumatriptan 100mg  Current ergotamine:none Current anti-emetic:Zofran Current muscle relaxants:Flexeril Current anti-anxiolytic:Valilum Current sleep aide:none Current Antihypertensive  medications:Lasix, Dyazid Current Antidepressant medications:none Current Anticonvulsant medications:none Current anti-CGRP:Ajovy  Current Vitamins/Herbal/Supplements:B12 Current Antihistamines/Decongestants:Sudafed, Zyrtec (not frequent) Other therapy:none Hormone/birth control:none  HISTORY: Onset:  Since childhood, but worse since 2009 Location:Bi-temporal and at base of skull, neck pain sometimes radiating down into right shoulder Quality:Constant severe aching, throbbing Initial Intensity:8/10.Shedenies new headache, thunderclap headache or severe headache that wakes herfrom sleep. Aura:no Premonitory Phase:no Postdrome:no Associated symptoms:Nausea, photophobia, phonophobia, osmophobia, blurred vision.Shedenies associated unilateral numbness or weakness. Initial Duration:Constant but eases if takes a Sudafed Initial Frequency:Daily, severe 1 to 2 days a week Initial Frequency of abortive medication:Sudafed daily, Excedrin one to twice a week Triggers:  Emotional stress,sleeping on right side (where she had cervical spine surgery), movement, neck pain, seasonal allergies, bright sunlight Relieving factors: Laying downto rest in dark room Activity:Cannot function  Past NSAIDS:Advil, Aleve Past analgesics:Tylenol,Percocet Past abortive triptans:none Past abortive ergotamine:none Past muscle relaxants:Flexeril, Robaxin, Zanflex Past anti-emetic:Chlorpromazine HCL 25-50mg , metoclopramide 10mg , promethazine Past antihypertensive medications:Propranolol (caused depression) Past antidepressant medications:Nortriptyline (advised to stop due to chest pain), Zoloft (increased headaches), doxepin 50mg  Past anticonvulsant medications:zonisamide 400mg , topiramate, Lyrica Past anti-CGRP:Emgality Past vitamins/Herbal/Supplements:MVI Past antihistamines/decongestants:Flonase Other past therapies:Physical therapy for  neck, acupuncture, trigger point injections  Past Medical History: Past Medical History:  Diagnosis Date  . Anginal pain ([place])    admit 06/2014; had non-ischemic stress test  . Anxiety   . Bipolar 1 disorder ([place])   . Colon polyp   . CTS (carpal tunnel syndrome)   . Depression   . Diabetes mellitus without complication ([place])   . Fever blister   . GERD (gastroesophageal reflux disease)   . HA (headache)   . HTN (hypertension)   . Hypercholesterolemia   . Migraines   . OA (osteoarthritis)   . Schizo-affective psychosis ([place])     Medications: Outpatient Encounter Medications as of 07/19/2019  Medication Sig  Note  . atorvastatin (LIPITOR) 10 MG tablet Take 10 mg by mouth every evening.   . budesonide-formoterol (SYMBICORT) 160-4.5 MCG/ACT inhaler Inhale 2 puffs into the lungs 2 (two) times daily.   . Calcium-Magnesium-Zinc (CAL-MAG-ZINC PO) Take by mouth daily.   . cholecalciferol (VITAMIN D) 1000 UNITS tablet Take 1,000 Units by mouth daily.   . cyclobenzaprine (FLEXERIL) 5 MG tablet TAKE 2 TABLETS BY MOUTH 3 TIMES DAILY AS NEEDED   . Dexlansoprazole 30 MG capsule Take 30 mg by mouth daily.   . diazepam (VALIUM) 2 MG tablet 1 tablet by mouth two times daily as needed for anxiety   . diazepam (VALIUM) 5 MG tablet 1  qhs   . doxepin (SINEQUAN) 50 MG capsule 1  qhs (Patient not taking: Reported on 01/16/2019)   . flurazepam (DALMANE) 30 MG capsule Take 1 capsule (30 mg total) by mouth at bedtime as needed for sleep. (Patient not taking: Reported on 01/16/2019)   . fluticasone (FLONASE) 50 MCG/ACT nasal spray Place 1 spray into both nostrils daily.   . Fremanezumab-vfrm (AJOVY) 225 MG/1.5ML SOAJ Inject 225 mg into the skin every 30 (thirty) days.   . furosemide (LASIX) 20 MG tablet Take 20 mg by mouth.   . linaclotide (LINZESS) 145 MCG CAPS capsule Take 145 mcg by mouth daily as needed (costipation).    . magic mouthwash SOLN Take 5 mLs by mouth 3 (three) times daily as needed for  mouth pain.   . mirtazapine (REMERON) 30 MG tablet Take 1 tablet (30 mg total) by mouth at bedtime.   . Multiple Vitamins-Minerals (MULTIVITAMIN WITH MINERALS) tablet Take 1 tablet by mouth every morning.    . NON FORMULARY 2 (two) times daily. [place]   . nortriptyline (PAMELOR) 50 MG capsule 1 qhs   . Omega-3 Fatty Acids (FISH OIL PO) Take by mouth 2 (two) times a day.   . ondansetron (ZOFRAN) 8 MG tablet Take 1 tablet (8 mg total) by mouth every 8 (eight) hours as needed for nausea or vomiting.   Marland Kitchen oxyCODONE-acetaminophen (PERCOCET) 10-325 MG tablet Take 1 tablet by mouth 2 (two) times daily. 11/06/2017: LF 10/06/17 #60/30day  . pantoprazole (PROTONIX) 40 MG tablet Take 40 mg by mouth daily.   Marland Kitchen PAZEO 0.7 % SOLN PLACE 1 DROP INTO BOTH EYES ONCE DAILY as needed for allergies   . potassium chloride SA (K-DUR,KLOR-CON) 20 MEQ tablet Take 20 mEq by mouth 2 (two) times daily.    . SUMAtriptan (IMITREX) 100 MG tablet Take 1 tablet (100 mg total) by mouth once as needed for up to 1 dose for migraine. May repeat in 2 hrs PRN. No more than 2 in 24 hrs. (Patient not taking: Reported on 01/16/2019)   . topiramate (TOPAMAX) 25 MG tablet Take 25 mg by mouth 2 (two) times daily. 01/16/2019: As needed  . triamterene-hydrochlorothiazide (DYAZIDE) 50-25 MG capsule Take 1 capsule by mouth daily.   . TURMERIC PO Take by mouth as needed.   . valACYclovir (VALTREX) 1000 MG tablet Take 1,000 mg by mouth daily.    . vitamin B-12 (CYANOCOBALAMIN) 1000 MCG tablet Take 1,000 mcg by mouth daily.    No facility-administered encounter medications on file as of 07/19/2019.    Allergies: Allergies  Allergen Reactions  . Effexor [Venlafaxine Hydrochloride] Itching and Other (See Comments)    headache  . Latex Itching and Rash  . Penicillins Hives    Has patient had a PCN reaction causing immediate rash, facial/tongue/throat swelling, SOB or  lightheadedness with hypotension: Yes Has patient had a PCN reaction causing  severe rash involving mucus membranes or skin necrosis: No Has patient had a PCN reaction that required hospitalization No Has patient had a PCN reaction occurring within the last 10 years: No If all of the above answers are "NO", then may proceed with Cephalosporin use.   . Zithromax [Azithromycin Dihydrate] Swelling  . Amlodipine Swelling  . Atorvastatin Swelling  . Butrans [Buprenorphine] Other (See Comments)    Ulcers-"mouth would not heal"  . Codeine Itching    Tolerable with benadryl  . Nucynta [Tapentadol] Other (See Comments)    Ulcers inside of mouth  . Vicodin [Hydrocodone-Acetaminophen] Itching  . Chantix [Varenicline Tartrate] Nausea Only  . Paroxetine Hcl Other (See Comments)    headache  . Tramadol Other (See Comments)    Pt states it interacted with her sertraline, but she is no longer on sertraline.  She does not remember the type of reaction she had.     Family History: Family History  Problem Relation Age of Onset  . Coronary artery disease Father   . Cancer Father        head neck   . Esophageal cancer Father   . Hypertension Mother   . Schizophrenia Mother   . Depression Brother   . Prostate cancer Brother   . Anesthesia problems Neg Hx   . Hypotension Neg Hx   . Malignant hyperthermia Neg Hx   . Pseudochol deficiency Neg Hx   . Allergic rhinitis Neg Hx   . Angioedema Neg Hx   . Asthma Neg Hx   . Atopy Neg Hx   . Eczema Neg Hx   . Immunodeficiency Neg Hx   . Urticaria Neg Hx   . Breast cancer Neg Hx     Social History: Social History   Socioeconomic History  . Marital status: Single    Spouse name: Not on file  . Number of children: 3  . Years of education: Not on file  . Highest education level: Some college, no degree  Occupational History  . Occupation: disabled/retired  Tobacco Use  . Smoking status: Current Some Day Smoker    Packs/day: 0.10    Years: 30.00    Pack years: 3.00    Types: Cigarettes  . Smokeless tobacco: Never  Used  Substance and Sexual Activity  . Alcohol use: No    Alcohol/week: 0.0 standard drinks  . Drug use: No  . Sexual activity: Never  Other Topics Concern  . Not on file  Social History Narrative   Lives in a two story home alone      [facility] [place] [facility]      Right handed   12th grade      Caffeine coffee 1 cup /day   Social Determinants of Health   Financial Resource Strain:   . Difficulty of Paying Living Expenses:   Food Insecurity:   . Worried About Charity fundraiser in the Last Year:   . Arboriculturist in the Last Year:   Transportation Needs:   . Film/video editor (Medical):   Marland Kitchen Lack of Transportation (Non-Medical):   Physical Activity:   . Days of Exercise per Week:   . Minutes of Exercise per Session:   Stress:   . Feeling of Stress :   Social Connections:   . Frequency of Communication with Friends and Family:   . Frequency of Social Gatherings with Friends  and Family:   . Attends Religious Services:   . Active Member of Clubs or Organizations:   . Attends Archivist Meetings:   Marland Kitchen Marital Status:   Intimate Partner Violence:   . Fear of Current or Ex-Partner:   . Emotionally Abused:   Marland Kitchen Physically Abused:   . Sexually Abused:     Observations/Objective:   *** No acute distress.  Alert and oriented.  Speech fluent and not dysarthric.  Language intact.  Eyes orthophoric on primary gaze.  Face symmetric.  Assessment and Plan:   Migraine without aura, without status migrainosus, not intractable  1.  For preventative management, *** 2.  For abortive therapy, *** 3.  Limit use of pain relievers to no more than 2 days out of week to prevent risk of rebound or medication-overuse headache. 4.  Keep headache diary 5.  Exercise, hydration, caffeine cessation, sleep hygiene, monitor for and avoid triggers 6.  Consider:  magnesium citrate 400mg  daily, riboflavin 400mg  daily, and coenzyme Q10 100mg  three times  daily 7. Always keep in mind that currently taking a hormone or birth control may be a possible trigger or aggravating factor for migraine. 8. Follow up ***   Follow Up Instructions:    -I discussed the assessment and treatment plan with the patient. The patient was provided an opportunity to ask questions and all were answered. The patient agreed with the plan and demonstrated an understanding of the instructions.   The patient was advised to call back or seek an in-person evaluation if the symptoms worsen or if the condition fails to improve as anticipated.    Total Time spent in visit with the patient was:  ***, of which more than 50% of the time was spent in counseling and/or coordinating care on ***.   Pt understands and agrees with the plan of care outlined.     Dudley Major, DO

## 2019-07-19 ENCOUNTER — Telehealth: Payer: Medicaid Other | Admitting: Neurology

## 2019-07-19 ENCOUNTER — Other Ambulatory Visit (HOSPITAL_COMMUNITY): Payer: Self-pay | Admitting: *Deleted

## 2019-07-19 ENCOUNTER — Other Ambulatory Visit: Payer: Self-pay

## 2019-07-19 MED ORDER — DIAZEPAM 2 MG PO TABS: ORAL_TABLET | ORAL | 2 refills | Status: DC

## 2019-07-21 ENCOUNTER — Ambulatory Visit (INDEPENDENT_AMBULATORY_CARE_PROVIDER_SITE_OTHER): Payer: Medicaid Other | Admitting: Psychiatry

## 2019-07-21 ENCOUNTER — Other Ambulatory Visit: Payer: Self-pay

## 2019-07-21 DIAGNOSIS — F339 Major depressive disorder, recurrent, unspecified: Secondary | ICD-10-CM

## 2019-07-21 NOTE — Progress Notes (Signed)
Patient ID: Gloria Lewis, female   DOB: [birth year], 64 y.o.   MRN: [id number] [facility] MD Progress Note  07/21/2019 11:33 AM Gloria Lewis  MRN:  [id number] Subjective:  Shoulder hurting Principal Problem: Major Depression,recurent Mild Diagnosis: Adjustment disorder with an anxious mood state   At this time the patient seems to be somewhat distressed.  I think it is acute distress.  She has specific problems with the people who will manage her apartment she is living in.  According to her they intrude upon her.  When she puts in a work order the Customer service manager in [employer], without notice.  Is actually coming to her home/apartment when she is sound asleep.  They do work without waking her up.  She feels intruded upon.  She feels so intruded upon that she has had months distributed all around her house that her motion detection times.  The patient also is planning to get a supportive therapy dog.  I have signed the papers for her to get it but they are very specific and cautious about letting her get this dog.  They have been first approve the dog that she would get.  The patient seems to be somewhat less compliant these days.  She stopped taking her nortriptyline because she decided to take it just when she feels distressed.  We also called in some Remeron for her that she takes now and then.  The patient takes her Valium just as prescribed.  I have no reason to believe that she is using any alcohol or drugs.  Patient Active Problem List   Diagnosis Date Noted  . Infectious gastroenteritis [A09] 04/16/2016  . Xerostomia [K11.7] 03/09/2016  . Surgery, elective [Z41.9] 05/23/2015  . S/P arthroscopy of shoulder [Z98.890] 05/23/2015  . Chronic migraine without aura without status migrainosus, not intractable [G43.709] 10/18/2014  . Tobacco abuse [Z72.0] 10/18/2014  . Obesity [E66.9] 09/23/2014  . COPD [J44.9] 09/02/2014  . Pulmonary hypertension ([place]) [I27.20] 08/31/2014  . Respiratory failure with  hypoxia ([place]) [J96.91] 08/14/2014  . Cigarette smoker [F17.210] 07/28/2014  . Major depressive disorder, recurrent episode, moderate ([place]) [F33.1] 07/06/2014  . Essential hypertension [I10]   . SOB (shortness of breath) [R06.02] 06/21/2014  . Precordial pain [R07.2] 06/21/2014  . Gastroesophageal reflux disease [K21.9] 06/21/2014  . Chest pain [R07.9] 06/21/2014  . HTN (hypertension) [I10]   . Neck pain [M54.2] 01/08/2014  . Major depressive disorder, recurrent episode, severe, without mention of psychotic behavior [F33.2] 10/14/2012  . Schizoaffective disorder ([place]) [F25.9] 05/26/2012  . 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 Hyperparathyroidism, primary ([place]) [E21.0] 10/06/2010  . DEGENERATIVE DISC DISEASE, LUMBOSACRAL SPINE [M51.37] 05/21/2007  . DERMATOPHYTOSIS OF THE BODY [B35.4] 05/10/2007  . 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 Anxiety state [F41.1] 03/24/2007  . DENTAL PAIN [K08.9] 03/24/2007  . SHOULDER PAIN, LEFT [M25.519] 03/24/2007   Total Time spent with patient:30 min  Past Psychiatric History:   Past Medical History:  Past Medical History:  Diagnosis Date  . Anginal pain ([place])    admit 06/2014; had non-ischemic stress test  . Anxiety   . Bipolar 1 disorder ([place])   . Colon polyp   . CTS (carpal tunnel syndrome)   . Depression   . Diabetes mellitus without complication ([place])   . Fever blister   . GERD (gastroesophageal reflux disease)   . HA (headache)   . HTN (hypertension)   . Hypercholesterolemia   . Migraines   . OA (osteoarthritis)   .  Schizo-affective psychosis ([place])     Past Surgical History:  Procedure Laterality Date  . ANTERIOR CERVICAL DECOMP/DISCECTOMY FUSION  08/27/2011   Procedure: ANTERIOR CERVICAL DECOMPRESSION/DISCECTOMY FUSION 1 LEVEL/HARDWARE REMOVAL;  Surgeon: Eustace Moore, MD;  Location: [facility];  Service: Neurosurgery;  Laterality: Bilateral;  Cervical four-five Anterior cervical decompression/diskectomy,  fusion, Plate, Removal of Cervical five-seven Plate  . back injection    . CARDIAC CATHETERIZATION N/A 11/09/2014   Procedure: Right Heart Cath;  Surgeon: Larey Dresser, MD;  Location: [facility];  Service: Cardiovascular;  Laterality: N/A;  . CARPAL TUNNEL RELEASE  20110 rt/lt   rt x2 , lt x1  . COLONOSCOPY  06/2017   [facility]  . ESOPHAGOGASTRODUODENOSCOPY  06/2017   [facility]  . HEMORRHOID SURGERY    . MULTIPLE TOOTH EXTRACTIONS    . NECK SURGERY  2009  . PITUITARY SURGERY     Had gland removed from producing too much calcium  . polp removed  2011  . RIGHT/LEFT HEART CATH AND CORONARY ANGIOGRAPHY N/A 07/05/2017   Procedure: RIGHT/LEFT HEART CATH AND CORONARY ANGIOGRAPHY;  Surgeon: Larey Dresser, MD;  Location: [facility];  Service: Cardiovascular;  Laterality: N/A;  . SHOULDER ARTHROSCOPY WITH ROTATOR CUFF REPAIR Right 05/23/2015   Procedure: RIGHT SHOULDER ARTHROSCOPY WITH REMOVAL OF SUTURE ANCHOR AND POSSIBLE REVISION ROTATOR CUFF REPAIR;  Surgeon: Justice Britain, MD;  Location: [facility];  Service: Orthopedics;  Laterality: Right;  . SHOULDER ARTHROSCOPY WITH SUBACROMIAL DECOMPRESSION Right 01/24/2015   Procedure: RIGHT SHOULDER ARTHROSCOPY WITH SUBACROMIAL DECOMPRESSION AD DISTAL CLAVICLE RESECTION ;  Surgeon: Justice Britain, MD;  Location: [facility];  Service: Orthopedics;  Laterality: Right;  Marland Kitchen VAGINAL DELIVERY     x3   Family History:  Family History  Problem Relation Age of Onset  . Coronary artery disease Father   . Cancer Father        head neck   . Esophageal cancer Father   . Hypertension Mother   . Schizophrenia Mother   . Depression Brother   . Prostate cancer Brother   . Anesthesia problems Neg Hx   . Hypotension Neg Hx   . Malignant hyperthermia Neg Hx   . Pseudochol deficiency Neg Hx   . Allergic rhinitis Neg Hx   . Angioedema Neg Hx   . Asthma Neg Hx   . Atopy Neg Hx   . Eczema Neg Hx   . Immunodeficiency Neg Hx   .  Urticaria Neg Hx   . Breast cancer Neg Hx    Family Psychiatric  History:  Social History:  Social History   Substance and Sexual Activity  Alcohol Use No  . Alcohol/week: 0.0 standard drinks     Social History   Substance and Sexual Activity  Drug Use No    Social History   Socioeconomic History  . Marital status: Single    Spouse name: Not on file  . Number of children: 3  . Years of education: Not on file  . Highest education level: Some college, no degree  Occupational History  . Occupation: disabled/retired  Tobacco Use  . Smoking status: Current Some Day Smoker    Packs/day: 0.10    Years: 30.00    Pack years: 3.00    Types: Cigarettes  . Smokeless tobacco: Never Used  Substance and Sexual Activity  . Alcohol use: No    Alcohol/week: 0.0 standard drinks  . Drug use: No  . Sexual activity:  Never  Other Topics Concern  . Not on file  Social History Narrative   Lives in a two story home alone      [facility] in [place] for pain management      Right handed   12th grade      Caffeine coffee 1 cup /day   Social Determinants of Health   Financial Resource Strain:   . Difficulty of Paying Living Expenses:   Food Insecurity:   . Worried About Charity fundraiser in the Last Year:   . Arboriculturist in the Last Year:   Transportation Needs:   . Film/video editor (Medical):   Marland Kitchen Lack of Transportation (Non-Medical):   Physical Activity:   . Days of Exercise per Week:   . Minutes of Exercise per Session:   Stress:   . Feeling of Stress :   Social Connections:   . Frequency of Communication with Friends and Family:   . Frequency of Social Gatherings with Friends and Family:   . Attends Religious Services:   . Active Member of Clubs or Organizations:   . Attends Archivist Meetings:   Marland Kitchen Marital Status:    Additional Social History:                         Sleep: Good  Appetite:  Fair  Current Medications: Current  Outpatient Medications  Medication Sig Dispense Refill  . atorvastatin (LIPITOR) 10 MG tablet Take 10 mg by mouth every evening.    . budesonide-formoterol (SYMBICORT) 160-4.5 MCG/ACT inhaler Inhale 2 puffs into the lungs 2 (two) times daily.    . Calcium-Magnesium-Zinc (CAL-MAG-ZINC PO) Take by mouth daily.    . cholecalciferol (VITAMIN D) 1000 UNITS tablet Take 1,000 Units by mouth daily.    . cyclobenzaprine (FLEXERIL) 5 MG tablet TAKE 2 TABLETS BY MOUTH 3 TIMES DAILY AS NEEDED  2  . Dexlansoprazole 30 MG capsule Take 30 mg by mouth daily.    . diazepam (VALIUM) 2 MG tablet 1 tablet by mouth two times daily as needed for anxiety 60 tablet 2  . diazepam (VALIUM) 5 MG tablet 1  qhs 30 tablet 2  . doxepin (SINEQUAN) 50 MG capsule 1  qhs (Patient not taking: Reported on 01/16/2019) 30 capsule 5  . flurazepam (DALMANE) 30 MG capsule Take 1 capsule (30 mg total) by mouth at bedtime as needed for sleep. (Patient not taking: Reported on 01/16/2019) 30 capsule 0  . fluticasone (FLONASE) 50 MCG/ACT nasal spray Place 1 spray into both nostrils daily.    . Fremanezumab-vfrm (AJOVY) 225 MG/1.5ML SOAJ Inject 225 mg into the skin every 30 (thirty) days. 1 pen 11  . furosemide (LASIX) 20 MG tablet Take 20 mg by mouth.    . linaclotide (LINZESS) 145 MCG CAPS capsule Take 145 mcg by mouth daily as needed (costipation).     . magic mouthwash SOLN Take 5 mLs by mouth 3 (three) times daily as needed for mouth pain.    . mirtazapine (REMERON) 30 MG tablet Take 1 tablet (30 mg total) by mouth at bedtime. 30 tablet 2  . Multiple Vitamins-Minerals (MULTIVITAMIN WITH MINERALS) tablet Take 1 tablet by mouth every morning.     . NON FORMULARY 2 (two) times daily. [place]    . nortriptyline (PAMELOR) 50 MG capsule 1 qhs 30 capsule 2  . Omega-3 Fatty Acids (FISH OIL PO) Take by mouth 2 (two) times a day.    Marland Kitchen  ondansetron (ZOFRAN) 8 MG tablet Take 1 tablet (8 mg total) by mouth every 8 (eight) hours as needed for nausea  or vomiting. 20 tablet 3  . oxyCODONE-acetaminophen (PERCOCET) 10-325 MG tablet Take 1 tablet by mouth 2 (two) times daily.    . pantoprazole (PROTONIX) 40 MG tablet Take 40 mg by mouth daily.    Marland Kitchen PAZEO 0.7 % SOLN PLACE 1 DROP INTO BOTH EYES ONCE DAILY as needed for allergies  3  . potassium chloride SA (K-DUR,KLOR-CON) 20 MEQ tablet Take 20 mEq by mouth 2 (two) times daily.     . SUMAtriptan (IMITREX) 100 MG tablet Take 1 tablet (100 mg total) by mouth once as needed for up to 1 dose for migraine. May repeat in 2 hrs PRN. No more than 2 in 24 hrs. (Patient not taking: Reported on 01/16/2019) 10 tablet 5  . topiramate (TOPAMAX) 25 MG tablet Take 25 mg by mouth 2 (two) times daily.    Marland Kitchen triamterene-hydrochlorothiazide (DYAZIDE) 50-25 MG capsule Take 1 capsule by mouth daily.    . TURMERIC PO Take by mouth as needed.    . valACYclovir (VALTREX) 1000 MG tablet Take 1,000 mg by mouth daily.     . vitamin B-12 (CYANOCOBALAMIN) 1000 MCG tablet Take 1,000 mcg by mouth daily.     No current facility-administered medications for this visit.    Lab Results: No results found for this or any previous visit (from the past 48 hour(s)).  Physical Findings: AIMS:  , ,  ,  ,    CIWA:    COWS:     Musculoskeletal: Strength & Muscle Tone: within normal limits Gait & Station: normal Patient leans: N/A  Psychiatric Specialty Exam: ROS  There were no vitals taken for this visit.There is no height or weight on file to calculate BMI.  General Appearance: Casual  Eye Contact::  Good  Speech:  Clear and Coherent  Volume:  Normal  Mood:  Euthymic  Affect:  Congruent  Thought Process:  Coherent  Orientation:  Full (Time, Place, and Person)  Thought Content:  WDL  Suicidal Thoughts:  No  Homicidal Thoughts:  No  Memory:  NA  Judgement:  Good  Insight:  Fair  Psychomotor Activity:  Normal  Concentration:  Fair  Recall:  Good  Fund of Knowledge:Good  Language: Good  Akathisia:  No  Handed:  Right   AIMS (if indicated):     Assets:   ADL's:  Intact  Cognition: WNL  Sleep:       Treatment Plan  07/21/2019, 11:33 AM  At this time the patient is #1 problem is major depression.  This time we clarify that she should take nortriptyline and Remeron every night.  That she should not miss it.  When she says she has more anxiety and agitation it is not clear if it is anxiety or if it is of depression is a prominent symptom.  I have sent her to take these 2 medicines regularly and to continue taking her Valium as prescribed.  This patient will be seen in the office in 3 months.  She is not suicidal.  She drinks no alcohol no drugs.  She is waiting to hear if she will get new housing in a different county which hopefully she will hear about in the next week or 2.  Hopefully when she comes back to see me she will have moved away from her very stressful apartment complex.  Again with [place] stressful about this  the management and the way they interact with her.

## 2019-07-24 ENCOUNTER — Other Ambulatory Visit: Payer: Self-pay

## 2019-08-07 ENCOUNTER — Ambulatory Visit: Payer: Medicaid Other | Attending: Internal Medicine

## 2019-08-07 DIAGNOSIS — Z23 Encounter for immunization: Secondary | ICD-10-CM

## 2019-08-07 NOTE — Progress Notes (Signed)
   Covid-19 Vaccination Clinic  Name:  NOREAN MCCLUNG    MRN: [id number] DOB: [birth year]  08/07/2019  Ms. Toller was observed post Covid-19 immunization for 15 minutes without incident. She was provided with Vaccine Information Sheet and instruction to access the V-Safe system.   Ms. Heyrman was instructed to call 911 with any severe reactions post vaccine: Marland Kitchen Difficulty breathing  . Swelling of face and throat  . A fast heartbeat  . A bad rash all over body  . Dizziness and weakness   Immunizations Administered    Name Date Dose VIS Date Route   Pfizer COVID-19 Vaccine 08/07/2019  8:25 AM 0.3 mL 05/31/2018 Intramuscular   Manufacturer: [place]   Lot: [id number]   [place]: [id number]

## 2019-08-09 ENCOUNTER — Encounter: Payer: Self-pay | Admitting: *Deleted

## 2019-08-11 ENCOUNTER — Other Ambulatory Visit (HOSPITAL_COMMUNITY): Payer: Self-pay | Admitting: *Deleted

## 2019-08-11 MED ORDER — DIAZEPAM 5 MG PO TABS: ORAL_TABLET | ORAL | 2 refills | Status: DC

## 2019-08-11 MED ORDER — DIAZEPAM 2 MG PO TABS: ORAL_TABLET | ORAL | 2 refills | Status: DC

## 2019-08-14 ENCOUNTER — Other Ambulatory Visit: Payer: Self-pay

## 2019-08-14 ENCOUNTER — Ambulatory Visit: Payer: Medicaid Other | Admitting: Diagnostic Neuroimaging

## 2019-08-14 ENCOUNTER — Ambulatory Visit: Payer: Medicaid Other | Admitting: Neurology

## 2019-08-14 ENCOUNTER — Encounter: Payer: Self-pay | Admitting: Diagnostic Neuroimaging

## 2019-08-14 ENCOUNTER — Telehealth: Payer: Self-pay | Admitting: *Deleted

## 2019-08-14 VITALS — BP 150/92 | HR 76 | Temp 97.7°F | Ht 67.0 in | Wt 233.0 lb

## 2019-08-14 DIAGNOSIS — G43109 Migraine with aura, not intractable, without status migrainosus: Secondary | ICD-10-CM | POA: Diagnosis not present

## 2019-08-14 MED ORDER — TOPIRAMATE 50 MG PO TABS: 50.0000 mg | ORAL_TABLET | Freq: Two times a day (BID) | ORAL | 12 refills | Status: DC

## 2019-08-14 MED ORDER — NURTEC 75 MG PO TBDP: 75.0000 mg | ORAL_TABLET | Freq: Every day | ORAL | 6 refills | Status: DC | PRN

## 2019-08-14 NOTE — Telephone Encounter (Signed)
[employer] PA, G43.109, failed: topiramate, imitrex, maxalt, excedrin, Ajovy, Zofran, flexeril, percocet, valium. [employer] PA form filled out, on MD's desk for review, signature.

## 2019-08-14 NOTE — Patient Instructions (Signed)
MIGRAINE WITH AURA  MIGRAINE PREVENTION  LIFESTYLE CHANGES -Stop or avoid smoking -Decrease or avoid caffeine / alcohol -Eat and sleep on a regular schedule -Exercise several times per week - start topiramate 50mg  at bedtime; after 1-2 weeks increase to 50mg  twice a day; drink plenty of water  MIGRAINE RESCUE  - ibuprofen, tylenol, naproxen as needed - start rimegepant (Nurtec) 75mg  as needed for breakthrough headache; max 8 per month  HEADACHES (family hx of brain aneurysm) - follow up MRI brain, MRA head (ordered by PCP)

## 2019-08-14 NOTE — Progress Notes (Signed)
[facility]  PATIENT: Gloria Lewis DOB: 15-Aug-1955  REFERRING CLINICIAN: Harlan Stains, MD HISTORY FROM: patient  REASON FOR VISIT: new consult    HISTORICAL  CHIEF COMPLAINT:  Chief Complaint  Patient presents with   Headache    rm 7 New Pt "stopped Ajovy due to allergic reaction of redness, itchng at injeciotn site, was ineffective; Sumatriptan and Topamax worked but I am out of them"   Paresthesias    HISTORY OF PRESENT ILLNESS:   64 year old female here for evaluation of headaches. Patient has had headaches since childhood with migraine features, throbbing severe headaches with nausea and sensitivity to light. Sometimes she sees visual disturbance before her headache starts like "raining" pattern. Patient averages three headaches per week. Triggers include stress, allergies and pain. She reports family history of brain aneurysm. Patient had several falls and injuries when she was younger where she hit her head on the ground.  Currently patient is struggling with right rotator cuff pain on pain medication. She does exercise five times per week. She drinks 1 cup of tea per day. Patient has remote history of substance abuse more than 25 years ago, currently abstinent. She does smoke cigarettes and is trying to quit. No alcohol use.  Patient is tried some migraine medications including topiramate, Imitrex, Maxalt, Ajovy, muscle relaxers, narcotics, benzodiazepines, trigger point injections without relief. In the past Topamax seem to work the best without any side effects.   REVIEW OF SYSTEMS: Full 14 system review of systems performed and negative with exception of: As per HPI.  ALLERGIES: Allergies  Allergen Reactions   Effexor [Venlafaxine Hydrochloride] Itching and Other (See Comments)    headache   Latex Itching and Rash   Penicillins Hives    Has patient had a PCN reaction causing immediate rash, facial/tongue/throat swelling, SOB or  lightheadedness with hypotension: Yes Has patient had a PCN reaction causing severe rash involving mucus membranes or skin necrosis: No Has patient had a PCN reaction that required hospitalization No Has patient had a PCN reaction occurring within the last 10 years: No If all of the above answers are "NO", then may proceed with Cephalosporin use.    Zithromax [Azithromycin Dihydrate] Swelling   Amlodipine Swelling   Atorvastatin Swelling    Muscle aches   Butrans [Buprenorphine] Other (See Comments)    Ulcers-"mouth would not heal"   Codeine Itching    Tolerable with benadryl   Nucynta [Tapentadol] Other (See Comments)    Ulcers inside of mouth   Vicodin [Hydrocodone-Acetaminophen] Itching   Chantix [Varenicline Tartrate] Nausea Only   Paroxetine Hcl Other (See Comments)    headache   Tramadol Other (See Comments)    Pt states it interacted with her sertraline, but she is no longer on sertraline.  She does not remember the type of reaction she had.     HOME MEDICATIONS: Outpatient Medications Prior to Visit  Medication Sig Dispense Refill   budesonide-formoterol (SYMBICORT) 160-4.5 MCG/ACT inhaler Inhale 2 puffs into the lungs 2 (two) times daily.     Calcium-Magnesium-Zinc (CAL-MAG-ZINC PO) Take by mouth daily.     cholecalciferol (VITAMIN D) 1000 UNITS tablet Take 1,000 Units by mouth daily.     Dexlansoprazole 30 MG capsule Take 30 mg by mouth daily.     diazepam (VALIUM) 2 MG tablet 1 tablet by mouth two times daily as needed for anxiety 60 tablet 2   diazepam (VALIUM) 5 MG tablet 1  qhs 30 tablet 2   linaclotide (  LINZESS) 145 MCG CAPS capsule Take 145 mcg by mouth daily as needed (costipation).      magic mouthwash SOLN Take 5 mLs by mouth 3 (three) times daily as needed for mouth pain.     mirtazapine (REMERON) 30 MG tablet Take 1 tablet (30 mg total) by mouth at bedtime. 30 tablet 2   Multiple Vitamins-Minerals (MULTIVITAMIN WITH MINERALS) tablet Take 1  tablet by mouth every morning.      NON FORMULARY 2 (two) times daily. [place]     Omega-3 Fatty Acids (FISH OIL PO) Take by mouth 2 (two) times a day.     ondansetron (ZOFRAN) 8 MG tablet Take 1 tablet (8 mg total) by mouth every 8 (eight) hours as needed for nausea or vomiting. 20 tablet 3   oxyCODONE-acetaminophen (PERCOCET) 10-325 MG tablet Take 1 tablet by mouth 2 (two) times daily.     triamterene-hydrochlorothiazide (DYAZIDE) 50-25 MG capsule Take 1 capsule by mouth daily.     TURMERIC PO Take by mouth as needed.     valACYclovir (VALTREX) 1000 MG tablet Take 1,000 mg by mouth daily.      atorvastatin (LIPITOR) 10 MG tablet Take 10 mg by mouth every evening.     cyclobenzaprine (FLEXERIL) 5 MG tablet TAKE 2 TABLETS BY MOUTH 3 TIMES DAILY AS NEEDED  2   doxepin (SINEQUAN) 50 MG capsule 1  qhs (Patient not taking: Reported on 01/16/2019) 30 capsule 5   flurazepam (DALMANE) 30 MG capsule Take 1 capsule (30 mg total) by mouth at bedtime as needed for sleep. (Patient not taking: Reported on 01/16/2019) 30 capsule 0   fluticasone (FLONASE) 50 MCG/ACT nasal spray Place 1 spray into both nostrils daily.     Fremanezumab-vfrm (AJOVY) 225 MG/1.5ML SOAJ Inject 225 mg into the skin every 30 (thirty) days. 1 pen 11   furosemide (LASIX) 20 MG tablet Take 20 mg by mouth.     nortriptyline (PAMELOR) 50 MG capsule 1 qhs 30 capsule 2   pantoprazole (PROTONIX) 40 MG tablet Take 40 mg by mouth daily.     PAZEO 0.7 % SOLN PLACE 1 DROP INTO BOTH EYES ONCE DAILY as needed for allergies  3   potassium chloride SA (K-DUR,KLOR-CON) 20 MEQ tablet Take 20 mEq by mouth 2 (two) times daily.      SUMAtriptan (IMITREX) 100 MG tablet Take 1 tablet (100 mg total) by mouth once as needed for up to 1 dose for migraine. May repeat in 2 hrs PRN. No more than 2 in 24 hrs. (Patient not taking: Reported on 01/16/2019) 10 tablet 5   topiramate (TOPAMAX) 25 MG tablet Take 25 mg by mouth 2 (two) times daily.       vitamin B-12 (CYANOCOBALAMIN) 1000 MCG tablet Take 1,000 mcg by mouth daily.     No facility-administered medications prior to visit.    PAST MEDICAL HISTORY: Past Medical History:  Diagnosis Date   Anginal pain ([place])    admit 06/2014; had non-ischemic stress test   Anxiety    Bipolar 1 disorder (HCC)    Colon polyp    CTS (carpal tunnel syndrome)    Depression    Diabetes mellitus without complication (HCC)    Fever blister    GERD (gastroesophageal reflux disease)    HA (headache)    HTN (hypertension)    Hypercholesterolemia    Migraines    OA (osteoarthritis)    Schizo-affective psychosis ([place])     PAST SURGICAL HISTORY: Past Surgical History:  Procedure Laterality Date   ANTERIOR CERVICAL DECOMP/DISCECTOMY FUSION  08/27/2011   Procedure: ANTERIOR CERVICAL DECOMPRESSION/DISCECTOMY FUSION 1 LEVEL/HARDWARE REMOVAL;  Surgeon: Eustace Moore, MD;  Location: [facility];  Service: Neurosurgery;  Laterality: Bilateral;  Cervical four-five Anterior cervical decompression/diskectomy, fusion, Plate, Removal of Cervical five-seven Plate   back injection     CARDIAC CATHETERIZATION N/A 11/09/2014   Procedure: Right Heart Cath;  Surgeon: Larey Dresser, MD;  Location: [facility];  Service: Cardiovascular;  Laterality: N/A;   CARPAL TUNNEL RELEASE  20110 rt/lt   rt x2 , lt x1   COLONOSCOPY  06/2017   [facility]   ESOPHAGOGASTRODUODENOSCOPY  06/2017   [facility]     MULTIPLE TOOTH EXTRACTIONS     NECK SURGERY  2009   PITUITARY SURGERY     Had gland removed from producing too much calcium   polp removed  2011   RIGHT/LEFT HEART CATH AND CORONARY ANGIOGRAPHY N/A 07/05/2017   Procedure: RIGHT/LEFT HEART CATH AND CORONARY ANGIOGRAPHY;  Surgeon: Larey Dresser, MD;  Location: [facility];  Service: Cardiovascular;  Laterality: N/A;   SHOULDER ARTHROSCOPY WITH ROTATOR CUFF REPAIR Right 05/23/2015    Procedure: RIGHT SHOULDER ARTHROSCOPY WITH REMOVAL OF SUTURE ANCHOR AND POSSIBLE REVISION ROTATOR CUFF REPAIR;  Surgeon: Justice Britain, MD;  Location: [facility];  Service: Orthopedics;  Laterality: Right;   SHOULDER ARTHROSCOPY WITH SUBACROMIAL DECOMPRESSION Right 01/24/2015   Procedure: RIGHT SHOULDER ARTHROSCOPY WITH SUBACROMIAL DECOMPRESSION AD DISTAL CLAVICLE RESECTION ;  Surgeon: Justice Britain, MD;  Location: [facility];  Service: Orthopedics;  Laterality: Right;   VAGINAL DELIVERY     x3    FAMILY HISTORY: Family History  Problem Relation Age of Onset   Coronary artery disease Father    Cancer Father        head neck    Esophageal cancer Father    Hypertension Mother    Schizophrenia Mother    Diabetes Mother    Heart attack Mother    Depression Brother    Suicidality Brother    Prostate cancer Brother    Cancer Brother        bone marrow   Schizophrenia Maternal Grandmother    Anesthesia problems Neg Hx    Hypotension Neg Hx    Malignant hyperthermia Neg Hx    Pseudochol deficiency Neg Hx    Allergic rhinitis Neg Hx    Angioedema Neg Hx    Asthma Neg Hx    Atopy Neg Hx    Eczema Neg Hx    Immunodeficiency Neg Hx    Urticaria Neg Hx    Breast cancer Neg Hx     SOCIAL HISTORY: Social History   Socioeconomic History   Marital status: Single    Spouse name: Not on file   Number of children: 3   Years of education: 12   Highest education level: Some college, no degree  Occupational History   Occupation: disabled/retired  Tobacco Use   Smoking status: Current Some Day Smoker    Packs/day: 0.10    Years: 30.00    Pack years: 3.00    Types: Cigarettes   Smokeless tobacco: Never Used  Substance and Sexual Activity   Alcohol use: No    Alcohol/week: 0.0 standard drinks   Drug use: No    Comment: hx crack addiction 2008   Sexual activity: Never  Other Topics Concern   Not on file  Social History  Narrative   Lives in a two story  home alone      [facility] [place] [facility]      Right handed   12th grade      Caffeine coffee 1 cup /day   Social Determinants of Health   Financial Resource Strain:    Difficulty of Paying Living Expenses:   Food Insecurity:    Worried About Charity fundraiser in the Last Year:    Arboriculturist in the Last Year:   Transportation Needs:    Film/video editor (Medical):    Lack of Transportation (Non-Medical):   Physical Activity:    Days of Exercise per Week:    Minutes of Exercise per Session:   Stress:    Feeling of Stress :   Social Connections:    Frequency of Communication with Friends and Family:    Frequency of Social Gatherings with Friends and Family:    Attends Religious Services:    Active Member of Clubs or Organizations:    Attends Music therapist:    Marital Status:   Intimate Partner Violence:    Fear of Current or Ex-Partner:    Emotionally Abused:    Physically Abused:    Sexually Abused:      PHYSICAL EXAM  GENERAL EXAM/CONSTITUTIONAL: Vitals:  Vitals:   08/14/19 0847  BP: (!) 150/92  Pulse: 76  Temp: 97.7 F (36.5 C)  Weight: 233 lb 0.6 oz (105.7 kg)  Height: 5\' 7"  (1.702 m)     Body mass index is 36.5 kg/m. Wt Readings from Last 3 Encounters:  08/14/19 233 lb 0.6 oz (105.7 kg)  01/16/19 235 lb (106.6 kg)  09/13/18 238 lb (108 kg)     Patient is in no distress; well developed, nourished and groomed; neck is supple  CARDIOVASCULAR:  Examination of carotid arteries is normal; no carotid bruits  Regular rate and rhythm, no murmurs  Examination of peripheral vascular system by observation and palpation is normal  EYES:  Ophthalmoscopic exam of optic discs and posterior segments is normal; no papilledema or hemorrhages  No exam data present  MUSCULOSKELETAL:  Gait, strength, tone, movements noted in Neurologic exam below  NEUROLOGIC: MENTAL STATUS:  No flowsheet  data found.  awake, alert, oriented to person, place and time  recent and remote memory intact  normal attention and concentration  language fluent, comprehension intact, naming intact  fund of knowledge appropriate  CRANIAL NERVE:   2nd - no papilledema on fundoscopic exam  2nd, 3rd, 4th, 6th - pupils equal and reactive to light, visual fields full to confrontation, extraocular muscles intact, no nystagmus  5th - facial sensation symmetric  7th - facial strength symmetric  8th - hearing intact  9th - palate elevates symmetrically, uvula midline  11th - shoulder shrug symmetric  12th - tongue protrusion midline  MOTOR:   normal bulk and tone, full strength in the BUE, BLE; EXCEPT LIMITED IN RIGHT ARM DUE TO RIGHT SHOULDER PAIN  SENSORY:   normal and symmetric to light touch, temperature, vibration  COORDINATION:   finger-nose-finger, fine finger movements normal  REFLEXES:   deep tendon reflexes TRACE and symmetric  GAIT/STATION:   narrow based gait     DIAGNOSTIC DATA (LABS, IMAGING, TESTING) - I reviewed patient records, labs, notes, testing and imaging myself where available.  Lab Results  Component Value Date   WBC 8.1 11/06/2017   HGB 12.9 11/06/2017   HCT 41.0  11/06/2017   MCV 90.9 11/06/2017   PLT 363 11/06/2017      Component Value Date/Time   NA 138 11/06/2017 1147   K 4.0 11/06/2017 1147   CL 100 11/06/2017 1147   CO2 30 11/06/2017 1147   GLUCOSE 98 11/06/2017 1147   BUN 12 11/06/2017 1147   CREATININE 0.91 11/06/2017 1147   CALCIUM 9.2 11/06/2017 1147   PROT 6.8 11/06/2017 1147   ALBUMIN 3.7 11/06/2017 1147   AST 32 11/06/2017 1147   ALT 20 11/06/2017 1147   ALKPHOS 104 11/06/2017 1147   BILITOT 0.7 11/06/2017 1147   GFRNONAA >60 11/06/2017 1147   GFRAA >60 11/06/2017 1147   Lab Results  Component Value Date   CHOL 176 08/11/2017   HDL 43 08/11/2017   LDLCALC 95 08/11/2017   TRIG 189 (H) 08/11/2017   CHOLHDL 4.1  08/11/2017   Lab Results  Component Value Date   HGBA1C 6.2 (H) 08/11/2017   Lab Results  Component Value Date   [id number] 08/11/2017   Lab Results  Component Value Date   TSH 1.015 08/11/2017    04/11/13 CT head  - Mild periventricular small vessel disease. No intracranial mass, hemorrhage, or acute appearing infarct.  05/06/16 MRI cervical spine 1. Interval C4-5 ACDF and remote C5-C7 ACDF.  No residual stenosis. 2. Minimal disc and facet degeneration elsewhere as above without stenosis.      ASSESSMENT AND PLAN  64 y.o. year old female here with:  Meds tried: topiramate, maxalt, imitrex, excedrin, percocet, flexeril, valium, ajovy, zofran  Dx:  1. Migraine with aura and without status migrainosus, not intractable      PLAN:  MIGRAINE WITH AURA  MIGRAINE PREVENTION  LIFESTYLE CHANGES -Stop or avoid smoking -Decrease or avoid caffeine / alcohol -Eat and sleep on a regular schedule -Exercise several times per week - start topiramate 50mg  at bedtime; after 1-2 weeks increase to 50mg  twice a day; drink plenty of water  MIGRAINE RESCUE  - ibuprofen, tylenol, naproxen as needed - start rimegepant (Nurtec) 75mg  as needed for breakthrough headache; max 8 per month  INCREASING HEADACHES (family hx of brain aneurysm) - follow up MRI brain, MRA head (ordered by PCP)   Meds ordered this encounter  Medications   topiramate (TOPAMAX) 50 MG tablet    Sig: Take 1 tablet (50 mg total) by mouth 2 (two) times daily.    Dispense:  60 tablet    Refill:  12   Rimegepant Sulfate (NURTEC) 75 MG TBDP    Sig: Take 75 mg by mouth daily as needed.    Dispense:  8 tablet    Refill:  6   Return in about 6 months (around 02/14/2020) for with NP (Amy Lomax).    Penni Bombard, MD [id number], [id number] AM Certified in Neurology, Neurophysiology and Neuroimaging  [facility] [address], [place], [place] [address] [phone]

## 2019-08-14 NOTE — Telephone Encounter (Signed)
Faxed Nurtec PA to [facility].

## 2019-08-15 NOTE — Telephone Encounter (Signed)
Called [employer] to check status of PA, spoke with Deanna who stated PA is pending PA [id number].

## 2019-08-16 NOTE — Telephone Encounter (Addendum)
Called [employer] to check Nurtec PA status, spoke with [facility] who stated PA was denied because the wrong [employer] PA form was used. She guided to correct form on website and advised it be submitted.  Interaction [id number]. New PA form filled out, placed on MD's desk for signature.

## 2019-08-17 ENCOUNTER — Telehealth (HOSPITAL_COMMUNITY): Payer: Self-pay

## 2019-08-17 ENCOUNTER — Encounter (HOSPITAL_COMMUNITY): Payer: Self-pay

## 2019-08-17 ENCOUNTER — Ambulatory Visit (HOSPITAL_COMMUNITY)
Admission: EM | Admit: 2019-08-17 | Discharge: 2019-08-17 | Disposition: A | Discharge status: Home / Self Care | Payer: Medicaid Other | Attending: Urgent Care | Admitting: Urgent Care

## 2019-08-17 ENCOUNTER — Other Ambulatory Visit: Payer: Self-pay

## 2019-08-17 DIAGNOSIS — R0981 Nasal congestion: Secondary | ICD-10-CM | POA: Insufficient documentation

## 2019-08-17 DIAGNOSIS — F319 Bipolar disorder, unspecified: Secondary | ICD-10-CM | POA: Insufficient documentation

## 2019-08-17 DIAGNOSIS — F419 Anxiety disorder, unspecified: Secondary | ICD-10-CM | POA: Diagnosis not present

## 2019-08-17 DIAGNOSIS — E119 Type 2 diabetes mellitus without complications: Secondary | ICD-10-CM | POA: Insufficient documentation

## 2019-08-17 DIAGNOSIS — R432 Parageusia: Secondary | ICD-10-CM | POA: Diagnosis not present

## 2019-08-17 DIAGNOSIS — R430 Anosmia: Secondary | ICD-10-CM | POA: Diagnosis not present

## 2019-08-17 DIAGNOSIS — Z885 Allergy status to narcotic agent status: Secondary | ICD-10-CM | POA: Insufficient documentation

## 2019-08-17 DIAGNOSIS — Z880 Allergy status to penicillin: Secondary | ICD-10-CM | POA: Insufficient documentation

## 2019-08-17 DIAGNOSIS — Z79899 Other long term (current) drug therapy: Secondary | ICD-10-CM | POA: Insufficient documentation

## 2019-08-17 DIAGNOSIS — Z888 Allergy status to other drugs, medicaments and biological substances status: Secondary | ICD-10-CM | POA: Insufficient documentation

## 2019-08-17 DIAGNOSIS — Z8719 Personal history of other diseases of the digestive system: Secondary | ICD-10-CM | POA: Diagnosis not present

## 2019-08-17 DIAGNOSIS — G43809 Other migraine, not intractable, without status migrainosus: Secondary | ICD-10-CM | POA: Insufficient documentation

## 2019-08-17 DIAGNOSIS — I10 Essential (primary) hypertension: Secondary | ICD-10-CM | POA: Insufficient documentation

## 2019-08-17 DIAGNOSIS — M1990 Unspecified osteoarthritis, unspecified site: Secondary | ICD-10-CM | POA: Diagnosis not present

## 2019-08-17 DIAGNOSIS — Z7951 Long term (current) use of inhaled steroids: Secondary | ICD-10-CM | POA: Insufficient documentation

## 2019-08-17 DIAGNOSIS — R439 Unspecified disturbances of smell and taste: Secondary | ICD-10-CM | POA: Diagnosis not present

## 2019-08-17 DIAGNOSIS — E7800 Pure hypercholesterolemia, unspecified: Secondary | ICD-10-CM | POA: Insufficient documentation

## 2019-08-17 DIAGNOSIS — Z833 Family history of diabetes mellitus: Secondary | ICD-10-CM | POA: Insufficient documentation

## 2019-08-17 DIAGNOSIS — Z881 Allergy status to other antibiotic agents status: Secondary | ICD-10-CM | POA: Insufficient documentation

## 2019-08-17 DIAGNOSIS — R0602 Shortness of breath: Secondary | ICD-10-CM | POA: Diagnosis not present

## 2019-08-17 DIAGNOSIS — F259 Schizoaffective disorder, unspecified: Secondary | ICD-10-CM | POA: Diagnosis not present

## 2019-08-17 DIAGNOSIS — K219 Gastro-esophageal reflux disease without esophagitis: Secondary | ICD-10-CM | POA: Insufficient documentation

## 2019-08-17 DIAGNOSIS — F17200 Nicotine dependence, unspecified, uncomplicated: Secondary | ICD-10-CM

## 2019-08-17 DIAGNOSIS — Z8249 Family history of ischemic heart disease and other diseases of the circulatory system: Secondary | ICD-10-CM | POA: Diagnosis not present

## 2019-08-17 DIAGNOSIS — Z720 Tobacco use: Secondary | ICD-10-CM | POA: Insufficient documentation

## 2019-08-17 DIAGNOSIS — Z20822 Contact with and (suspected) exposure to covid-19: Secondary | ICD-10-CM | POA: Insufficient documentation

## 2019-08-17 DIAGNOSIS — R519 Headache, unspecified: Secondary | ICD-10-CM

## 2019-08-17 MED ORDER — CETIRIZINE HCL 10 MG PO TABS: 10.0000 mg | ORAL_TABLET | Freq: Every day | ORAL | 0 refills | Status: DC

## 2019-08-17 MED ORDER — SUMATRIPTAN SUCCINATE 6 MG/0.5ML ~~LOC~~ SOLN
6.0000 mg | Freq: Once | SUBCUTANEOUS | Status: AC
  Administered 2019-08-17: 6 mg via SUBCUTANEOUS

## 2019-08-17 MED ORDER — SUMATRIPTAN SUCCINATE 6 MG/0.5ML ~~LOC~~ SOLN
SUBCUTANEOUS | Status: AC
  Filled 2019-08-17: qty 0.5

## 2019-08-17 MED ORDER — MONTELUKAST SODIUM 10 MG PO TABS: 10.0000 mg | ORAL_TABLET | Freq: Every day | ORAL | 0 refills | Status: DC

## 2019-08-17 MED ORDER — SUMATRIPTAN 20 MG/ACT NA SOLN: NASAL | 0 refills | Status: DC

## 2019-08-17 NOTE — ED Provider Notes (Signed)
[place]   MRN: [id number] DOB: 10-15-1955  Subjective:   Gloria Lewis is a 64 y.o. female presenting for 3-4 day hx of persistent severe headache with photophobia. Patient has hx of persistent headaches, is managed closely by neurology.  Has tried multiple regimens but cannot recall being trialed on a triptan medication.  She also reports nasal congestion and shortness of breath that are chronic.  Patient is working on quitting smoking.  She is down to 3 cigarettes a day.  Last COVID vaccine was ~10 days ago.   No current facility-administered medications for this encounter.  Current Outpatient Medications:  .  Ginger, Zingiber officinalis, (GINGER EXTRACT PO), Take by mouth., Disp: , Rfl:  .  atorvastatin (LIPITOR) 10 MG tablet, Take 10 mg by mouth every evening., Disp: , Rfl:  .  budesonide-formoterol (SYMBICORT) 160-4.5 MCG/ACT inhaler, Inhale 2 puffs into the lungs 2 (two) times daily., Disp: , Rfl:  .  Calcium-Magnesium-Zinc (CAL-MAG-ZINC PO), Take by mouth daily., Disp: , Rfl:  .  cholecalciferol (VITAMIN D) 1000 UNITS tablet, Take 1,000 Units by mouth daily., Disp: , Rfl:  .  cyclobenzaprine (FLEXERIL) 5 MG tablet, TAKE 2 TABLETS BY MOUTH 3 TIMES DAILY AS NEEDED, Disp: , Rfl: 2 .  Dexlansoprazole 30 MG capsule, Take 30 mg by mouth daily., Disp: , Rfl:  .  diazepam (VALIUM) 2 MG tablet, 1 tablet by mouth two times daily as needed for anxiety, Disp: 60 tablet, Rfl: 2 .  diazepam (VALIUM) 5 MG tablet, 1  qhs, Disp: 30 tablet, Rfl: 2 .  doxepin (SINEQUAN) 50 MG capsule, 1  qhs (Patient not taking: Reported on 01/16/2019), Disp: 30 capsule, Rfl: 5 .  flurazepam (DALMANE) 30 MG capsule, Take 1 capsule (30 mg total) by mouth at bedtime as needed for sleep. (Patient not taking: Reported on 01/16/2019), Disp: 30 capsule, Rfl: 0 .  fluticasone (FLONASE) 50 MCG/ACT nasal spray, Place 1 spray into both nostrils daily., Disp: , Rfl:  .  furosemide (LASIX) 20 MG tablet, Take 20 mg  by mouth., Disp: , Rfl:  .  linaclotide (LINZESS) 145 MCG CAPS capsule, Take 145 mcg by mouth daily as needed (costipation). , Disp: , Rfl:  .  magic mouthwash SOLN, Take 5 mLs by mouth 3 (three) times daily as needed for mouth pain., Disp: , Rfl:  .  mirtazapine (REMERON) 30 MG tablet, Take 1 tablet (30 mg total) by mouth at bedtime., Disp: 30 tablet, Rfl: 2 .  Multiple Vitamins-Minerals (MULTIVITAMIN WITH MINERALS) tablet, Take 1 tablet by mouth every morning. , Disp: , Rfl:  .  NON FORMULARY, 2 (two) times daily. [place], Disp: , Rfl:  .  nortriptyline (PAMELOR) 50 MG capsule, 1 qhs, Disp: 30 capsule, Rfl: 2 .  Omega-3 Fatty Acids (FISH OIL PO), Take by mouth 2 (two) times a day., Disp: , Rfl:  .  ondansetron (ZOFRAN) 8 MG tablet, Take 1 tablet (8 mg total) by mouth every 8 (eight) hours as needed for nausea or vomiting., Disp: 20 tablet, Rfl: 3 .  oxyCODONE-acetaminophen (PERCOCET) 10-325 MG tablet, Take 1 tablet by mouth 2 (two) times daily., Disp: , Rfl:  .  pantoprazole (PROTONIX) 40 MG tablet, Take 40 mg by mouth daily., Disp: , Rfl:  .  PAZEO 0.7 % SOLN, PLACE 1 DROP INTO BOTH EYES ONCE DAILY as needed for allergies, Disp: , Rfl: 3 .  potassium chloride SA (K-DUR,KLOR-CON) 20 MEQ tablet, Take 20 mEq by mouth 2 (two) times daily. , Disp: ,  Rfl:  .  Rimegepant Sulfate (NURTEC) 75 MG TBDP, Take 75 mg by mouth daily as needed., Disp: 8 tablet, Rfl: 6 .  topiramate (TOPAMAX) 50 MG tablet, Take 1 tablet (50 mg total) by mouth 2 (two) times daily., Disp: 60 tablet, Rfl: 12 .  triamterene-hydrochlorothiazide (DYAZIDE) 50-25 MG capsule, Take 1 capsule by mouth daily., Disp: , Rfl:  .  TURMERIC PO, Take by mouth as needed., Disp: , Rfl:  .  valACYclovir (VALTREX) 1000 MG tablet, Take 1,000 mg by mouth daily. , Disp: , Rfl:  .  vitamin B-12 (CYANOCOBALAMIN) 1000 MCG tablet, Take 1,000 mcg by mouth daily., Disp: , Rfl:    Allergies  Allergen Reactions  . Effexor [Venlafaxine Hydrochloride] Itching  and Other (See Comments)    headache  . Latex Itching and Rash  . Penicillins Hives    Has patient had a PCN reaction causing immediate rash, facial/tongue/throat swelling, SOB or lightheadedness with hypotension: Yes Has patient had a PCN reaction causing severe rash involving mucus membranes or skin necrosis: No Has patient had a PCN reaction that required hospitalization No Has patient had a PCN reaction occurring within the last 10 years: No If all of the above answers are "NO", then may proceed with Cephalosporin use.   . Zithromax [Azithromycin Dihydrate] Swelling  . Amlodipine Swelling  . Atorvastatin Swelling    Muscle aches  . Butrans [Buprenorphine] Other (See Comments)    Ulcers-"mouth would not heal"  . Codeine Itching    Tolerable with benadryl  . Nucynta [Tapentadol] Other (See Comments)    Ulcers inside of mouth  . Vicodin [Hydrocodone-Acetaminophen] Itching  . Chantix [Varenicline Tartrate] Nausea Only  . Paroxetine Hcl Other (See Comments)    headache  . Tramadol Other (See Comments)    Pt states it interacted with her sertraline, but she is no longer on sertraline.  She does not remember the type of reaction she had.     Past Medical History:  Diagnosis Date  . Anginal pain ([place])    admit 06/2014; had non-ischemic stress test  . Anxiety   . Bipolar 1 disorder ([place])   . Colon polyp   . CTS (carpal tunnel syndrome)   . Depression   . Diabetes mellitus without complication ([place])   . Fever blister   . GERD (gastroesophageal reflux disease)   . HA (headache)   . HTN (hypertension)   . Hypercholesterolemia   . Migraines   . OA (osteoarthritis)   . Schizo-affective psychosis ([place])      Past Surgical History:  Procedure Laterality Date  . ANTERIOR CERVICAL DECOMP/DISCECTOMY FUSION  08/27/2011   Procedure: ANTERIOR CERVICAL DECOMPRESSION/DISCECTOMY FUSION 1 LEVEL/HARDWARE REMOVAL;  Surgeon: Eustace Moore, MD;  Location: [facility];  Service: Neurosurgery;   Laterality: Bilateral;  Cervical four-five Anterior cervical decompression/diskectomy, fusion, Plate, Removal of Cervical five-seven Plate  . back injection    . CARDIAC CATHETERIZATION N/A 11/09/2014   Procedure: Right Heart Cath;  Surgeon: Larey Dresser, MD;  Location: [facility];  Service: Cardiovascular;  Laterality: N/A;  . CARPAL TUNNEL RELEASE  20110 rt/lt   rt x2 , lt x1  . COLONOSCOPY  06/2017   [facility]  . ESOPHAGOGASTRODUODENOSCOPY  06/2017   [facility]  . HEMORRHOID SURGERY    . MULTIPLE TOOTH EXTRACTIONS    . NECK SURGERY  2009  . PITUITARY SURGERY     Had gland removed from producing too much calcium  .  polp removed  2011  . RIGHT/LEFT HEART CATH AND CORONARY ANGIOGRAPHY N/A 07/05/2017   Procedure: RIGHT/LEFT HEART CATH AND CORONARY ANGIOGRAPHY;  Surgeon: Larey Dresser, MD;  Location: [facility];  Service: Cardiovascular;  Laterality: N/A;  . SHOULDER ARTHROSCOPY WITH ROTATOR CUFF REPAIR Right 05/23/2015   Procedure: RIGHT SHOULDER ARTHROSCOPY WITH REMOVAL OF SUTURE ANCHOR AND POSSIBLE REVISION ROTATOR CUFF REPAIR;  Surgeon: Justice Britain, MD;  Location: [facility];  Service: Orthopedics;  Laterality: Right;  . SHOULDER ARTHROSCOPY WITH SUBACROMIAL DECOMPRESSION Right 01/24/2015   Procedure: RIGHT SHOULDER ARTHROSCOPY WITH SUBACROMIAL DECOMPRESSION AD DISTAL CLAVICLE RESECTION ;  Surgeon: Justice Britain, MD;  Location: [facility];  Service: Orthopedics;  Laterality: Right;  Marland Kitchen VAGINAL DELIVERY     x3    Family History  Problem Relation Age of Onset  . Coronary artery disease Father   . Cancer Father        head neck   . Esophageal cancer Father   . Hypertension Mother   . Schizophrenia Mother   . Diabetes Mother   . Heart attack Mother   . Depression Brother   . Suicidality Brother   . Prostate cancer Brother   . Cancer Brother        bone marrow  . Schizophrenia Maternal Grandmother   . Anesthesia problems Neg Hx   . Hypotension  Neg Hx   . Malignant hyperthermia Neg Hx   . Pseudochol deficiency Neg Hx   . Allergic rhinitis Neg Hx   . Angioedema Neg Hx   . Asthma Neg Hx   . Atopy Neg Hx   . Eczema Neg Hx   . Immunodeficiency Neg Hx   . Urticaria Neg Hx   . Breast cancer Neg Hx     Social History   Tobacco Use  . Smoking status: Current Some Day Smoker    Packs/day: 0.10    Years: 30.00    Pack years: 3.00    Types: Cigarettes  . Smokeless tobacco: Never Used  Substance Use Topics  . Alcohol use: No    Alcohol/week: 0.0 standard drinks  . Drug use: No    Comment: hx crack addiction 2008    ROS   Objective:   Vitals: BP 133/80   Pulse 80   Temp 98.4 F (36.9 C) (Oral)   Resp 18   Ht 5\' 7"  (1.702 m)   Wt 233 lb (105.7 kg)   SpO2 100%   BMI 36.49 kg/m   Physical Exam Constitutional:      General: She is not in acute distress.    Appearance: Normal appearance. She is well-developed. She is obese. She is not ill-appearing, toxic-appearing or diaphoretic.  HENT:     Head: Normocephalic and atraumatic.     Nose: Nose normal.     Mouth/Throat:     Mouth: Mucous membranes are moist.     Pharynx: Oropharynx is clear. No oropharyngeal exudate or posterior oropharyngeal erythema.  Eyes:     General: No scleral icterus.       Right eye: No discharge.        Left eye: No discharge.     Extraocular Movements: Extraocular movements intact.     Conjunctiva/sclera: Conjunctivae normal.     Pupils: Pupils are equal, round, and reactive to light.  Cardiovascular:     Rate and Rhythm: Normal rate and regular rhythm.     Pulses: Normal pulses.     Heart sounds: Normal heart sounds. No murmur.  No friction rub. No gallop.   Pulmonary:     Effort: Pulmonary effort is normal. No respiratory distress.     Breath sounds: Normal breath sounds. No stridor. No wheezing, rhonchi or rales.  Skin:    General: Skin is warm and dry.     Findings: No rash.  Neurological:     General: No focal deficit  present.     Mental Status: She is alert and oriented to person, place, and time.     Cranial Nerves: No cranial nerve deficit.     Motor: No weakness.     Coordination: Coordination normal.     Gait: Gait normal.     Deep Tendon Reflexes: Reflexes normal.  Psychiatric:        Mood and Affect: Mood normal.        Behavior: Behavior normal.        Thought Content: Thought content normal.        Judgment: Judgment normal.     Assessment and Plan :   PDMP not reviewed this encounter.  1. Other migraine without status migrainosus, not intractable   2. Bad headache   3. Nasal congestion   4. Loss of taste   5. Loss of smell   6. Shortness of breath   7. Smoker     Subcutaneous sumatriptan given in clinic. Use sumatriptan nasal spray as outpatient.  Encouraged patient to continue efforts at smoking cessation.  We will have patient start regimen of Zyrtec and Singulair for persistent allergies.  Patient has very close follow-up and has work-up pending with her neurologist and PCP.  Recommended close follow-up with them.  Patient did have a nasal swab for COVID-19 while she was in the clinic.  Unfortunately, the sample was mislabeled after patient had already left clinic.  We are unable to reach her to return.  Clinical staff was still trying to reach out to her but I advised radiology technologist Stasha to discontinue the order.  Patient admits longstanding history of shortness of breath and loss of sense of taste and smell for months now given her history of allergies and smoking.  She has also been vaccinated for COVID-19 and therefore have low suspicion for COVID-19.  Counseled patient on potential for adverse effects with medications prescribed/recommended today, ER and return-to-clinic precautions discussed, patient verbalized understanding.   Jaynee Eagles, PA-C 08/17/19 1303

## 2019-08-17 NOTE — ED Triage Notes (Signed)
Pt c/o blurred vision, nausea, loss of smell and taste, numbness in right arm, 10/10 throbbing pain in head, light sensitivity, bp up and downx3 wks. Pt states this all started after first COVID vaccine. Pt saw a neurologist on Monday about her migraine and they prescribed tomazepam and it's not helping and the other med he prescribed isn't covered by her insurance.

## 2019-08-18 LAB — SARS CORONAVIRUS 2 (TAT 6-24 HRS): SARS Coronavirus 2: NEGATIVE

## 2019-08-21 ENCOUNTER — Other Ambulatory Visit: Payer: Self-pay

## 2019-08-21 NOTE — Telephone Encounter (Addendum)
New Nurtec PA faxed to [place] on 08/16/19. Called [place] [employer] to check status of PA. Spoke with [place], [place] denied because question #3 indicated patient has > 15 headaches/month in past 6 months. I checked note and she has 12 /month. [place] advised to fill out and submit a new PA with correction.   New Nurtec PA on MD's desk for signature.

## 2019-08-22 ENCOUNTER — Encounter: Payer: Self-pay | Admitting: *Deleted

## 2019-08-22 NOTE — Telephone Encounter (Addendum)
Called  [employer], spoke with Caryl Pina who stated nurtec is approved, PA [id number], effective 08/22/19 until 08/16/2020. Interaction ID [id number].  Sent patient my chart to advise her.

## 2019-08-31 ENCOUNTER — Other Ambulatory Visit: Payer: Self-pay | Admitting: Family Medicine

## 2019-08-31 DIAGNOSIS — R519 Headache, unspecified: Secondary | ICD-10-CM

## 2019-08-31 DIAGNOSIS — Z8249 Family history of ischemic heart disease and other diseases of the circulatory system: Secondary | ICD-10-CM

## 2019-09-13 ENCOUNTER — Other Ambulatory Visit (HOSPITAL_COMMUNITY): Payer: Self-pay | Admitting: *Deleted

## 2019-09-13 MED ORDER — DIAZEPAM 2 MG PO TABS: ORAL_TABLET | ORAL | 2 refills | Status: DC

## 2019-09-13 MED ORDER — DIAZEPAM 5 MG PO TABS: ORAL_TABLET | ORAL | 2 refills | Status: DC

## 2019-09-15 ENCOUNTER — Emergency Department (HOSPITAL_BASED_OUTPATIENT_CLINIC_OR_DEPARTMENT_OTHER): Payer: Medicaid Other

## 2019-09-15 ENCOUNTER — Emergency Department (HOSPITAL_COMMUNITY)
Admission: EM | Admit: 2019-09-15 | Discharge: 2019-09-15 | Disposition: A | Discharge status: Home / Self Care | Payer: Medicaid Other | Attending: Emergency Medicine | Admitting: Emergency Medicine

## 2019-09-15 ENCOUNTER — Encounter (HOSPITAL_COMMUNITY): Payer: Self-pay | Admitting: Emergency Medicine

## 2019-09-15 ENCOUNTER — Emergency Department (HOSPITAL_COMMUNITY): Payer: Medicaid Other

## 2019-09-15 ENCOUNTER — Other Ambulatory Visit: Payer: Self-pay

## 2019-09-15 DIAGNOSIS — M7989 Other specified soft tissue disorders: Secondary | ICD-10-CM | POA: Diagnosis not present

## 2019-09-15 DIAGNOSIS — F17210 Nicotine dependence, cigarettes, uncomplicated: Secondary | ICD-10-CM | POA: Insufficient documentation

## 2019-09-15 DIAGNOSIS — Z79899 Other long term (current) drug therapy: Secondary | ICD-10-CM | POA: Diagnosis not present

## 2019-09-15 DIAGNOSIS — Z888 Allergy status to other drugs, medicaments and biological substances status: Secondary | ICD-10-CM | POA: Diagnosis not present

## 2019-09-15 DIAGNOSIS — R0602 Shortness of breath: Secondary | ICD-10-CM | POA: Insufficient documentation

## 2019-09-15 DIAGNOSIS — R0789 Other chest pain: Secondary | ICD-10-CM | POA: Insufficient documentation

## 2019-09-15 DIAGNOSIS — R079 Chest pain, unspecified: Secondary | ICD-10-CM

## 2019-09-15 DIAGNOSIS — M79609 Pain in unspecified limb: Secondary | ICD-10-CM | POA: Diagnosis not present

## 2019-09-15 DIAGNOSIS — I10 Essential (primary) hypertension: Secondary | ICD-10-CM | POA: Diagnosis not present

## 2019-09-15 DIAGNOSIS — J449 Chronic obstructive pulmonary disease, unspecified: Secondary | ICD-10-CM | POA: Diagnosis not present

## 2019-09-15 DIAGNOSIS — E119 Type 2 diabetes mellitus without complications: Secondary | ICD-10-CM | POA: Insufficient documentation

## 2019-09-15 DIAGNOSIS — F419 Anxiety disorder, unspecified: Secondary | ICD-10-CM | POA: Diagnosis not present

## 2019-09-15 LAB — CBC
HCT: 42.6 % (ref 36.0–46.0)
Hemoglobin: 14.1 g/dL (ref 12.0–15.0)
MCH: 28.5 pg (ref 26.0–34.0)
MCHC: 33.1 g/dL (ref 30.0–36.0)
MCV: 86.2 fL (ref 80.0–100.0)
Platelets: 381 10*3/uL (ref 150–400)
RBC: 4.94 MIL/uL (ref 3.87–5.11)
RDW: 13.7 % (ref 11.5–15.5)
WBC: 7 10*3/uL (ref 4.0–10.5)
nRBC: 0 % (ref 0.0–0.2)

## 2019-09-15 LAB — BASIC METABOLIC PANEL
Anion gap: 11 (ref 5–15)
BUN: 19 mg/dL (ref 8–23)
CO2: 27 mmol/L (ref 22–32)
Calcium: 9.6 mg/dL (ref 8.9–10.3)
Chloride: 101 mmol/L (ref 98–111)
Creatinine, Ser: 0.97 mg/dL (ref 0.44–1.00)
GFR calc Af Amer: >60 mL/min (ref >60)
GFR calc non Af Amer: >60 mL/min (ref >60)
Glucose, Bld: 97 mg/dL (ref 70–99)
Potassium: 3.7 mmol/L (ref 3.5–5.1)
Sodium: 139 mmol/L (ref 135–145)

## 2019-09-15 LAB — TROPONIN I (HIGH SENSITIVITY): Troponin I (High Sensitivity): 3 ng/L (ref <18)

## 2019-09-15 LAB — D-DIMER, QUANTITATIVE: D-Dimer, Quant: 0.39 ug/mL-FEU (ref 0.00–0.50)

## 2019-09-15 MED ORDER — PANTOPRAZOLE SODIUM 40 MG PO TBEC: 40.0000 mg | DELAYED_RELEASE_TABLET | Freq: Every day | ORAL | 0 refills | Status: DC

## 2019-09-15 MED ORDER — OXYCODONE-ACETAMINOPHEN 5-325 MG PO TABS
2.0000 | ORAL_TABLET | Freq: Once | ORAL | Status: AC
  Administered 2019-09-15: 2 via ORAL
  Filled 2019-09-15: qty 2

## 2019-09-15 MED ORDER — ONDANSETRON HCL 4 MG/2ML IJ SOLN
4.0000 mg | Freq: Once | INTRAMUSCULAR | Status: AC
  Administered 2019-09-15: 4 mg via INTRAVENOUS
  Filled 2019-09-15: qty 2

## 2019-09-15 NOTE — ED Triage Notes (Signed)
Per pt, states she got her 2nd covid vaccine on 5/15-states since then she has been having right lower calf pain, right chest pain and right arm pain-has seen PCP and work up has been negative-

## 2019-09-15 NOTE — Discharge Instructions (Signed)
Please schedule follow-up appointment with your primary doctor to discuss the symptoms you are experiencing today.  If you have worsening chest pain, difficulty breathing or other concerns symptom, recommend return to ER for reassessment.

## 2019-09-15 NOTE — Progress Notes (Signed)
Right lower extremity venous duplex completed. Refer to "CV Proc" under chart review to view preliminary results.  09/15/2019 2:06 PM Kelby Aline., MHA, RVT, RDCS, RDMS

## 2019-09-15 NOTE — ED Provider Notes (Signed)
[name] Provider Note   CSN: [id number] Arrival date & time: 09/15/19  1020     History Chief Complaint  Patient presents with  . Chest Pain    Gloria Lewis is a 64 y.o. female.  Presents to ER with chest pain.  Patient states that she has been having intermittent symptoms ever since her Covid vaccine on 5/15.  States that she has intermittent chest pain, more in the right side rating down right arm.  Dull, achy pain, mild to moderate.  Not currently having pain.  Also states that she has noted some intermittent pain and swelling in her right leg, not currently having any swelling or pain.  Also reports that she occasionally has a burning sensation in her chest.  Reports prior history of gastric reflux but no longer on any medications.  Also feels generally fatigued and somewhat more short of breath than normal.  Symptoms not associated with exertion.  No fever.  Complete additional history from chart review.  Noted left heart cath in April 2019, nonobstructive coronary artery disease.  HPI     Past Medical History:  Diagnosis Date  . Anginal pain ([place])    admit 06/2014; had non-ischemic stress test  . Anxiety   . Bipolar 1 disorder ([place])   . Colon polyp   . CTS (carpal tunnel syndrome)   . Depression   . Diabetes mellitus without complication ([place])   . Fever blister   . GERD (gastroesophageal reflux disease)   . HA (headache)   . HTN (hypertension)   . Hypercholesterolemia   . Migraines   . OA (osteoarthritis)   . Schizo-affective psychosis [facility])     Patient Active Problem List   Diagnosis Date Noted  . Infectious gastroenteritis 04/16/2016  . Xerostomia 03/09/2016  . Surgery, elective 05/23/2015  . S/P arthroscopy of shoulder 05/23/2015  . Chronic migraine without aura without status migrainosus, not intractable 10/18/2014  . Tobacco abuse 10/18/2014  . Obesity 09/23/2014  . COPD 09/02/2014  . Pulmonary hypertension ([place])  08/31/2014  . Respiratory failure with hypoxia ([place]) 08/14/2014  . Cigarette smoker 07/28/2014  . Major depressive disorder, recurrent episode, moderate ([place]) 07/06/2014  . Essential hypertension   . SOB (shortness of breath) 06/21/2014  . Precordial pain 06/21/2014  . Gastroesophageal reflux disease 06/21/2014  . Chest pain 06/21/2014  . HTN (hypertension)   . Neck pain 01/08/2014  . Major depressive disorder, recurrent episode, severe, without mention of psychotic behavior 10/14/2012  . Schizoaffective disorder ([place]) 05/26/2012  . Parathyroid adenoma 10/06/2010  . Hyperparathyroidism, primary ([place]) 10/06/2010  . DEGENERATIVE DISC DISEASE, LUMBOSACRAL SPINE 05/21/2007  . DERMATOPHYTOSIS OF THE BODY 05/10/2007  . Depressive type psychosis ([place]) 03/24/2007  . Anxiety state 03/24/2007  . DENTAL PAIN 03/24/2007  . SHOULDER PAIN, LEFT 03/24/2007    Past Surgical History:  Procedure Laterality Date  . ANTERIOR CERVICAL DECOMP/DISCECTOMY FUSION  08/27/2011   Procedure: ANTERIOR CERVICAL DECOMPRESSION/DISCECTOMY FUSION 1 LEVEL/HARDWARE REMOVAL;  Surgeon: Eustace Moore, MD;  Location: [facility]URO ORS;  Service: Neurosurgery;  Laterality: Bilateral;  Cervical four-five Anterior cervical decompression/diskectomy, fusion, Plate, Removal of Cervical five-seven Plate  . back injection    . CARDIAC CATHETERIZATION N/A 11/09/2014   Procedure: Right Heart Cath;  Surgeon: Larey Dresser, MD;  Location: [facility] CV LAB;  Service: Cardiovascular;  Laterality: N/A;  . CARPAL TUNNEL RELEASE  20110 rt/lt   rt x2 , lt x1  . COLONOSCOPY  06/2017  [facility]  . ESOPHAGOGASTRODUODENOSCOPY  06/2017   [facility]  . HEMORRHOID SURGERY    . MULTIPLE TOOTH EXTRACTIONS    . NECK SURGERY  2009  . PITUITARY SURGERY     Had gland removed from producing too much calcium  . polp removed  2011  . RIGHT/LEFT HEART CATH AND CORONARY ANGIOGRAPHY N/A 07/05/2017   Procedure: RIGHT/LEFT HEART  CATH AND CORONARY ANGIOGRAPHY;  Surgeon: Larey Dresser, MD;  Location: [facility] CV LAB;  Service: Cardiovascular;  Laterality: N/A;  . SHOULDER ARTHROSCOPY WITH ROTATOR CUFF REPAIR Right 05/23/2015   Procedure: RIGHT SHOULDER ARTHROSCOPY WITH REMOVAL OF SUTURE ANCHOR AND POSSIBLE REVISION ROTATOR CUFF REPAIR;  Surgeon: Justice Britain, MD;  Location: [facility];  Service: Orthopedics;  Laterality: Right;  . SHOULDER ARTHROSCOPY WITH SUBACROMIAL DECOMPRESSION Right 01/24/2015   Procedure: RIGHT SHOULDER ARTHROSCOPY WITH SUBACROMIAL DECOMPRESSION AD DISTAL CLAVICLE RESECTION ;  Surgeon: Justice Britain, MD;  Location: [facility];  Service: Orthopedics;  Laterality: Right;  Marland Kitchen VAGINAL DELIVERY     x3     OB History   No obstetric history on file.     Family History  Problem Relation Age of Onset  . Coronary artery disease Father   . Cancer Father        head neck   . Esophageal cancer Father   . Hypertension Mother   . Schizophrenia Mother   . Diabetes Mother   . Heart attack Mother   . Depression Brother   . Suicidality Brother   . Prostate cancer Brother   . Cancer Brother        bone marrow  . Schizophrenia Maternal Grandmother   . Anesthesia problems Neg Hx   . Hypotension Neg Hx   . Malignant hyperthermia Neg Hx   . Pseudochol deficiency Neg Hx   . Allergic rhinitis Neg Hx   . Angioedema Neg Hx   . Asthma Neg Hx   . Atopy Neg Hx   . Eczema Neg Hx   . Immunodeficiency Neg Hx   . Urticaria Neg Hx   . Breast cancer Neg Hx     Social History   Tobacco Use  . Smoking status: Current Some Day Smoker    Packs/day: 0.10    Years: 30.00    Pack years: 3.00    Types: Cigarettes  . Smokeless tobacco: Never Used  Vaping Use  . Vaping Use: Some days  Substance Use Topics  . Alcohol use: No    Alcohol/week: 0.0 standard drinks  . Drug use: No    Comment: hx crack addiction 2008    Home Medications Prior to Admission medications   Medication Sig Start Date End Date Taking?  Authorizing Provider  budesonide-formoterol (SYMBICORT) 160-4.5 MCG/ACT inhaler Inhale 2 puffs into the lungs 2 (two) times daily.    [provider]  Calcium-Magnesium-Zinc (CAL-MAG-ZINC PO) Take by mouth daily.    [provider]  cetirizine (ZYRTEC ALLERGY) 10 MG tablet Take 1 tablet (10 mg total) by mouth daily. 08/17/19   Jaynee Eagles, PA-C  cholecalciferol (VITAMIN D) 1000 UNITS tablet Take 1,000 Units by mouth daily.    [provider]  diazepam (VALIUM) 2 MG tablet 1 tablet by mouth two times daily as needed for anxiety 10/11/19   Norma Fredrickson, MD  diazepam (VALIUM) 5 MG tablet 1  qhs 10/11/19   Plovsky, Berneta Sages, MD  doxepin (SINEQUAN) 50 MG capsule 1  qhs Patient not taking: Reported on 01/16/2019 01/12/19  Norma Fredrickson, MD  flurazepam [facility]) 30 MG capsule Take 1 capsule (30 mg total) by mouth at bedtime as needed for sleep. Patient not taking: Reported on 01/16/2019 10/20/18   Norma Fredrickson, MD  Ginger, Zingiber officinalis, (GINGER EXTRACT PO) Take by mouth.    [provider]  linaclotide (LINZESS) 145 MCG CAPS capsule Take 145 mcg by mouth daily as needed (costipation).     [provider]  magic mouthwash SOLN Take 5 mLs by mouth 3 (three) times daily as needed for mouth pain.    [provider]  mirtazapine (REMERON) 30 MG tablet Take 1 tablet (30 mg total) by mouth at bedtime. 06/28/19 06/27/20  Plovsky, Berneta Sages, MD  montelukast (SINGULAIR) 10 MG tablet Take 1 tablet (10 mg total) by mouth at bedtime. 08/17/19   Jaynee Eagles, PA-C  Multiple Vitamins-Minerals (MULTIVITAMIN WITH MINERALS) tablet Take 1 tablet by mouth every morning.     [provider]  NON FORMULARY 2 (two) times daily. [place]    [provider]  nortriptyline (PAMELOR) 50 MG capsule 1 qhs 04/19/19   Plovsky, Berneta Sages, MD  Omega-3 Fatty Acids (FISH OIL PO) Take by mouth 2 (two) times a day.    [provider]  ondansetron (ZOFRAN) 8 MG  tablet Take 1 tablet (8 mg total) by mouth every 8 (eight) hours as needed for nausea or vomiting. 11/14/18   Pieter Partridge, DO  oxyCODONE-acetaminophen (PERCOCET) 10-325 MG tablet Take 1 tablet by mouth 2 (two) times daily.    [provider]  pantoprazole (PROTONIX) 40 MG tablet Take 40 mg by mouth daily. 12/22/18   [provider]  PAZEO 0.7 % SOLN PLACE 1 DROP INTO BOTH EYES ONCE DAILY as needed for allergies 08/09/17   [provider]  potassium chloride SA (K-DUR,KLOR-CON) 20 MEQ tablet Take 20 mEq by mouth 2 (two) times daily.     [provider]  SUMAtriptan (IMITREX) 20 MG/ACT nasal spray Use 1 spray in a single nostril. If the headache persists after 2 hours, may do 1 more spray. Do not exceed 2 sprays in 24 hours. 08/17/19   Jaynee Eagles, PA-C  topiramate (TOPAMAX) 50 MG tablet Take 1 tablet (50 mg total) by mouth 2 (two) times daily. 08/14/19   Penumalli, Earlean Polka, MD  triamterene-hydrochlorothiazide (DYAZIDE) 50-25 MG capsule Take 1 capsule by mouth daily.    [provider]  TURMERIC PO Take by mouth as needed.    [provider]  valACYclovir (VALTREX) 1000 MG tablet Take 1,000 mg by mouth daily.     [provider]  vitamin B-12 (CYANOCOBALAMIN) 1000 MCG tablet Take 1,000 mcg by mouth daily.    [provider]  Dexlansoprazole 30 MG capsule Take 30 mg by mouth daily.  08/17/19  [provider]  furosemide (LASIX) 20 MG tablet Take 20 mg by mouth.  08/17/19  [provider]    Allergies    Effexor [venlafaxine hydrochloride], Latex, Penicillins, Zithromax [azithromycin dihydrate], Amlodipine, Atorvastatin, Butrans [buprenorphine], Codeine, Nucynta [tapentadol], Vicodin [hydrocodone-acetaminophen], Chantix [varenicline tartrate], Paroxetine hcl, and Tramadol  Review of Systems   Review of Systems  Constitutional: Positive for fatigue. Negative for chills and fever.  HENT: Negative for ear pain and sore  throat.   Eyes: Negative for pain and visual disturbance.  Respiratory: Positive for shortness of breath. Negative for cough.   Cardiovascular: Positive for chest pain. Negative for palpitations.  Gastrointestinal: Negative for abdominal pain and vomiting.  Genitourinary: Negative for  dysuria and hematuria.  Musculoskeletal: Negative for arthralgias and back pain.  Skin: Negative for color change and rash.  Neurological: Negative for seizures and syncope.  All other systems reviewed and are negative.   Physical Exam Updated Vital Signs BP 108/67 (BP Location: Left Arm)   Pulse 66   Temp 98.8 F (37.1 C) (Oral)   Resp 18   SpO2 97%   Physical Exam Vitals and nursing note reviewed.  Constitutional:      General: She is not in acute distress.    Appearance: She is well-developed.  HENT:     Head: Normocephalic and atraumatic.  Eyes:     Conjunctiva/sclera: Conjunctivae normal.  Cardiovascular:     Rate and Rhythm: Normal rate and regular rhythm.     Heart sounds: No murmur heard.   Pulmonary:     Effort: Pulmonary effort is normal. No respiratory distress.     Breath sounds: Normal breath sounds.  Abdominal:     Palpations: Abdomen is soft.     Tenderness: There is no abdominal tenderness.  Musculoskeletal:     Cervical back: Neck supple.     Right lower leg: No tenderness. No edema.     Left lower leg: No edema.  Skin:    General: Skin is warm and dry.  Neurological:     Mental Status: She is alert.     ED Results / Procedures / Treatments   Labs (all labs ordered are listed, but only abnormal results are displayed) Labs Reviewed  BASIC METABOLIC PANEL  CBC  D-DIMER, QUANTITATIVE (NOT AT [facility])  TROPONIN I (HIGH SENSITIVITY)  TROPONIN I (HIGH SENSITIVITY)    EKG EKG Interpretation  Date/Time:  Friday September 15 2019 11:18:48 EDT Ventricular Rate:  75 PR Interval:    QRS Duration: 98 QT Interval:  392 QTC Calculation: 438 R Axis:   13 Text  Interpretation: Sinus rhythm RSR' in V1 or V2, right VCD or RVH Confirmed by Madalyn Rob [phone]) on 09/15/2019 12:34:49 PM   Radiology DG Chest 2 View  Result Date: 09/15/2019 CLINICAL DATA:  Right lower chest pain.  Hypertension. EXAM: CHEST - 2 VIEW COMPARISON:  08/03/2014 FINDINGS: Stable lingular and right middle lobe scarring. Atherosclerotic calcification of the aortic arch. Mild cardiomegaly. No new airspace opacity is identified. The lungs appear otherwise clear. No blunting of the costophrenic angles. Interval postoperative findings along the right AC joint. Possible right glenohumeral arthropathy with some sclerosis in the vicinity of the right humeral head IMPRESSION: 1. Stable scarring in the lingula and right middle lobe. 2. Mild cardiomegaly, without edema. Electronically Signed   By: Van Clines M.D.   On: 09/15/2019 11:17   VAS [place] LOWER EXTREMITY VENOUS (DVT) (ONLY [facility] & [facility] 7a-7p)  Result Date: 09/15/2019  Lower Venous DVTStudy Indications: Pain, and Swelling.  Comparison Study: No prior study Performing Technologist: Maudry Mayhew MHA, RDMS, RVT, RDCS  Examination Guidelines: A complete evaluation includes B-mode imaging, spectral Doppler, color Doppler, and power Doppler as needed of all accessible portions of each vessel. Bilateral testing is considered an integral part of a complete examination. Limited examinations for reoccurring indications may be performed as noted. The reflux portion of the exam is performed with the patient in reverse Trendelenburg.  +---------+---------------+---------+-----------+----------+--------------+ RIGHT    CompressibilityPhasicitySpontaneityPropertiesThrombus Aging +---------+---------------+---------+-----------+----------+--------------+ CFV      Full           Yes      Yes                                 +---------+---------------+---------+-----------+----------+--------------+  SFJ      Full                                                         +---------+---------------+---------+-----------+----------+--------------+ FV Prox  Full                                                        +---------+---------------+---------+-----------+----------+--------------+ FV Mid   Full                                                        +---------+---------------+---------+-----------+----------+--------------+ FV DistalFull                                                        +---------+---------------+---------+-----------+----------+--------------+ PFV      Full                                                        +---------+---------------+---------+-----------+----------+--------------+ POP      Full           Yes      Yes                                 +---------+---------------+---------+-----------+----------+--------------+ PTV      Full                                                        +---------+---------------+---------+-----------+----------+--------------+ PERO     Full                                                        +---------+---------------+---------+-----------+----------+--------------+   +----+---------------+---------+-----------+----------+--------------+ LEFTCompressibilityPhasicitySpontaneityPropertiesThrombus Aging +----+---------------+---------+-----------+----------+--------------+ CFV Full           Yes      Yes                                 +----+---------------+---------+-----------+----------+--------------+     Summary: RIGHT: - There is no evidence of deep vein thrombosis in the lower extremity.  - No cystic structure found in the popliteal fossa.  LEFT: - No evidence of common femoral vein obstruction.  *See table(s) above for measurements and observations.    Preliminary  Procedures Procedures (including critical care time)  Medications Ordered in ED Medications  oxyCODONE-acetaminophen (PERCOCET/ROXICET)  5-325 MG per tablet 2 tablet (2 tablets Oral Given 09/15/19 1219)  ondansetron (ZOFRAN) injection 4 mg (4 mg Intravenous Given 09/15/19 1401)    ED Course  I have reviewed the triage vital signs and the nursing notes.  Pertinent labs & imaging results that were available during my care of the patient were reviewed by me and considered in my medical decision making (see chart for details).    MDM Rules/Calculators/A&P                          64 year old lady presented to ER with multiple concerns including intermittent chest discomfort, right leg pain.  On exam patient noted to be remarkably well-appearing, no obvious deformity or edema to her right leg.  EKG without acute ischemic changes, troponin within normal limits, doubt ACS.  CXR negative.  D-dimer within normal limits, doubt PE.  DVT study negative of right leg. Rec recheck with PCP next week, return for recurrent/worsening symptoms.  After the discussed management above, the patient was determined to be safe for discharge.  The patient was in agreement with this plan and all questions regarding their care were answered.  ED return precautions were discussed and the patient will return to the ED with any significant worsening of condition.  Final Clinical Impression(s) / ED Diagnoses Final diagnoses:  Chest pain, unspecified type    Rx / DC Orders ED Discharge Orders    None       Lucrezia Starch, MD 09/15/19 1537

## 2019-09-29 ENCOUNTER — Telehealth (HOSPITAL_COMMUNITY): Payer: Self-pay

## 2019-09-29 NOTE — Telephone Encounter (Signed)
Received fax from pharmacy requesting a PA be done on patient's Diazepam 2mg  tablet. I spoke with Maggie from [facility] who stated that a PA IS NOT REQUIRED.  Reference # [id number]

## 2019-10-05 ENCOUNTER — Other Ambulatory Visit: Payer: Self-pay

## 2019-10-05 ENCOUNTER — Ambulatory Visit
Admission: RE | Admit: 2019-10-05 | Discharge: 2019-10-05 | Disposition: A | Discharge status: Home / Self Care | Payer: Medicaid Other | Source: Ambulatory Visit | Attending: Family Medicine | Admitting: Family Medicine

## 2019-10-05 ENCOUNTER — Other Ambulatory Visit: Payer: Self-pay | Admitting: Family Medicine

## 2019-10-05 ENCOUNTER — Other Ambulatory Visit: Payer: Medicaid Other

## 2019-10-05 DIAGNOSIS — R519 Headache, unspecified: Secondary | ICD-10-CM

## 2019-10-05 DIAGNOSIS — Z8249 Family history of ischemic heart disease and other diseases of the circulatory system: Secondary | ICD-10-CM

## 2019-10-06 ENCOUNTER — Ambulatory Visit (INDEPENDENT_AMBULATORY_CARE_PROVIDER_SITE_OTHER): Payer: Medicaid Other | Admitting: Psychiatry

## 2019-10-06 DIAGNOSIS — F324 Major depressive disorder, single episode, in partial remission: Secondary | ICD-10-CM | POA: Diagnosis not present

## 2019-10-06 MED ORDER — DIAZEPAM 2 MG PO TABS: ORAL_TABLET | ORAL | 4 refills | Status: DC

## 2019-10-06 MED ORDER — MIRTAZAPINE 30 MG PO TABS: 30.0000 mg | ORAL_TABLET | Freq: Every day | ORAL | 4 refills | Status: DC

## 2019-10-06 MED ORDER — DIAZEPAM 5 MG PO TABS: ORAL_TABLET | ORAL | 4 refills | Status: DC

## 2019-10-06 MED ORDER — DIAZEPAM 5 MG PO TABS: ORAL_TABLET | ORAL | 2 refills | Status: DC

## 2019-10-06 MED ORDER — NORTRIPTYLINE HCL 50 MG PO CAPS
50.0000 mg | ORAL_CAPSULE | Freq: Every day | ORAL | 2 refills | Status: DC
Start: 2019-10-06 — End: 2019-12-20

## 2019-10-06 NOTE — Progress Notes (Signed)
Patient ID: Gloria Lewis, female   DOB: 06-01-1955, 64 y.o.   MRN: [id number] [facility] MD Progress Note  10/06/2019 11:59 AM Gloria Lewis  MRN:  [id number] Subjective:  Shoulder hurting Principal Problem: Major Depression,recurent Mild Diagnosis: Adjustment disorder with an anxious mood state  Today the patient is seen in the office.  In some ways chaos continues.  On the other hand this patient is wonderfully resourceful and resilient.  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  She lives at a housing situation where the landlord and workers come into her setting without her permission.  Patient has a camera set up to document this.  Patient is contracted for this housing setting is good until December.,  December she will certainly mood.  She wants to stay in [place] but hopefully she will move to a new living environment.  She has 2 sons 1 who is severely mentally ill.  Generally the patient is sleeping fairly well.  Because her neighbors are so loud and noisy at night she ends up sleeping at a nontraditional time then gets up and goes to the gym at 5:30 in the morning to 630 where she exercises.  Essentially she exercises goes to volunteer work and looks forward to getting back to go for college where for 3 more credits to have a psychology degree.  The patient has her own website.  The patient continues in one-to-one therapy.  She now takes her medicines just as prescribed.  She is not psychotic.  She has had a rare psychiatric hospitalizations.  In fact I think she is only on 1.  The patient is not suicidal.  She denies chest pain or shortness of breath.  She has lost weight.    Patient Active Problem List   Diagnosis Date Noted  . Infectious gastroenteritis [A09] 04/16/2016  . Xerostomia [K11.7] 03/09/2016  . Surgery, elective [Z41.9] 05/23/2015  . S/P arthroscopy of shoulder [Z98.890] 05/23/2015  . Chronic migraine without aura without status migrainosus, not intractable [G43.709] 10/18/2014   . Tobacco abuse [Z72.0] 10/18/2014  . Obesity [E66.9] 09/23/2014  . COPD [J44.9] 09/02/2014  . Pulmonary hypertension ([place]) [I27.20] 08/31/2014  . Respiratory failure with hypoxia ([place]) [J96.91] 08/14/2014  . Cigarette smoker [F17.210] 07/28/2014  . Major depressive disorder, recurrent episode, moderate ([place]) [F33.1] 07/06/2014  . Essential hypertension [I10]   . SOB (shortness of breath) [R06.02] 06/21/2014  . Precordial pain [R07.2] 06/21/2014  . Gastroesophageal reflux disease [K21.9] 06/21/2014  . Chest pain [R07.9] 06/21/2014  . HTN (hypertension) [I10]   . Neck pain [M54.2] 01/08/2014  . Major depressive disorder, recurrent episode, severe, without mention of psychotic behavior [F33.2] 10/14/2012  . Schizoaffective disorder ([place]) [F25.9] 05/26/2012  . 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 Hyperparathyroidism, primary ([place]) [E21.0] 10/06/2010  . DEGENERATIVE DISC DISEASE, LUMBOSACRAL SPINE [M51.37] 05/21/2007  . DERMATOPHYTOSIS OF THE BODY [B35.4] 05/10/2007  . 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 Anxiety state [F41.1] 03/24/2007  . DENTAL PAIN [K08.9] 03/24/2007  . SHOULDER PAIN, LEFT [M25.519] 03/24/2007   Total Time spent with patient:30 min  Past Psychiatric History:   Past Medical History:  Past Medical History:  Diagnosis Date  . Anginal pain ([place])    admit 06/2014; had non-ischemic stress test  . Anxiety   . Bipolar 1 disorder ([place])   . Colon polyp   . CTS (carpal tunnel syndrome)   . Depression   . Diabetes mellitus without complication ([place])   . Fever blister   .  GERD (gastroesophageal reflux disease)   . HA (headache)   . HTN (hypertension)   . Hypercholesterolemia   . Migraines   . OA (osteoarthritis)   . Schizo-affective psychosis ([place])     Past Surgical History:  Procedure Laterality Date  . ANTERIOR CERVICAL DECOMP/DISCECTOMY FUSION  08/27/2011   Procedure: ANTERIOR CERVICAL DECOMPRESSION/DISCECTOMY FUSION 1 LEVEL/HARDWARE  REMOVAL;  Surgeon: Eustace Moore, MD;  Location: [place] [facility];  Service: Neurosurgery;  Laterality: Bilateral;  Cervical four-five Anterior cervical decompression/diskectomy, fusion, Plate, Removal of Cervical five-seven Plate  . back injection    . CARDIAC CATHETERIZATION N/A 11/09/2014   Procedure: Right Heart Cath;  Surgeon: Larey Dresser, MD;  Location: [place] CV [facility];  Service: Cardiovascular;  Laterality: N/A;  . CARPAL TUNNEL RELEASE  20110 rt/lt   rt x2 , lt x1  . COLONOSCOPY  06/2017   [facility]  . ESOPHAGOGASTRODUODENOSCOPY  06/2017   [facility]  . HEMORRHOID SURGERY    . MULTIPLE TOOTH EXTRACTIONS    . NECK SURGERY  2009  . PITUITARY SURGERY     Had gland removed from producing too much calcium  . polp removed  2011  . RIGHT/LEFT HEART CATH AND CORONARY ANGIOGRAPHY N/A 07/05/2017   Procedure: RIGHT/LEFT HEART CATH AND CORONARY ANGIOGRAPHY;  Surgeon: Larey Dresser, MD;  Location: [place] [facility];  Service: Cardiovascular;  Laterality: N/A;  . SHOULDER ARTHROSCOPY WITH ROTATOR CUFF REPAIR Right 05/23/2015   Procedure: RIGHT SHOULDER ARTHROSCOPY WITH REMOVAL OF SUTURE ANCHOR AND POSSIBLE REVISION ROTATOR CUFF REPAIR;  Surgeon: Justice Britain, MD;  Location: [place];  Service: Orthopedics;  Laterality: Right;  . SHOULDER ARTHROSCOPY WITH SUBACROMIAL DECOMPRESSION Right 01/24/2015   Procedure: RIGHT SHOULDER ARTHROSCOPY WITH SUBACROMIAL DECOMPRESSION AD DISTAL CLAVICLE RESECTION ;  Surgeon: Justice Britain, MD;  Location: [place];  Service: Orthopedics;  Laterality: Right;  Marland Kitchen VAGINAL DELIVERY     x3   Family History:  Family History  Problem Relation Age of Onset  . Coronary artery disease Father   . Cancer Father        head neck   . Esophageal cancer Father   . Hypertension Mother   . Schizophrenia Mother   . Diabetes Mother   . Heart attack Mother   . Depression Brother   . Suicidality Brother   . Prostate cancer Brother   . Cancer Brother         bone marrow  . Schizophrenia Maternal Grandmother   . Anesthesia problems Neg Hx   . Hypotension Neg Hx   . Malignant hyperthermia Neg Hx   . Pseudochol deficiency Neg Hx   . Allergic rhinitis Neg Hx   . Angioedema Neg Hx   . Asthma Neg Hx   . Atopy Neg Hx   . Eczema Neg Hx   . Immunodeficiency Neg Hx   . Urticaria Neg Hx   . Breast cancer Neg Hx    Family Psychiatric  History:  Social History:  Social History   Substance and Sexual Activity  Alcohol Use No  . Alcohol/week: 0.0 standard drinks     Social History   Substance and Sexual Activity  Drug Use No   Comment: hx crack addiction 2008    Social History   Socioeconomic History  . Marital status: Single    Spouse name: Not on file  . Number of children: 3  . Years of education: 51  . Highest education level: Some college, no degree  Occupational History  . Occupation: disabled/retired  Tobacco Use  . Smoking status: Current Some Day Smoker    Packs/day: 0.10    Years: 30.00    Pack years: 3.00    Types: Cigarettes  . Smokeless tobacco: Never Used  Vaping Use  . Vaping Use: Some days  Substance and Sexual Activity  . Alcohol use: No    Alcohol/week: 0.0 standard drinks  . Drug use: No    Comment: hx crack addiction 2008  . Sexual activity: Never  Other Topics Concern  . Not on file  Social History Narrative   Lives in a two story home alone      [facility] in [place] for pain [place]      Right handed   12th grade      Caffeine coffee 1 cup /day   Social Determinants of Health   Financial Resource Strain:   . Difficulty of Paying Living Expenses:   Food Insecurity:   . Worried About Charity fundraiser in the Last Year:   . Arboriculturist in the Last Year:   Transportation Needs:   . Film/video editor (Medical):   Marland Kitchen Lack of Transportation (Non-Medical):   Physical Activity:   . Days of Exercise per Week:   . Minutes of Exercise per Session:   Stress:   . Feeling of Stress :    Social Connections:   . Frequency of Communication with Friends and Family:   . Frequency of Social Gatherings with Friends and Family:   . Attends Religious Services:   . Active Member of Clubs or Organizations:   . Attends Archivist Meetings:   Marland Kitchen Marital Status:    Additional Social History:                         Sleep: Good  Appetite:  Fair  Current Medications: Current Outpatient Medications  Medication Sig Dispense Refill  . budesonide-formoterol (SYMBICORT) 160-4.5 MCG/ACT inhaler Inhale 2 puffs into the lungs 2 (two) times daily.    . Calcium-Magnesium-Zinc (CAL-MAG-ZINC PO) Take by mouth daily.    . cetirizine (ZYRTEC ALLERGY) 10 MG tablet Take 1 tablet (10 mg total) by mouth daily. 90 tablet 0  . cholecalciferol (VITAMIN D) 1000 UNITS tablet Take 1,000 Units by mouth daily.    Derrill Memo ON 10/11/2019] diazepam (VALIUM) 2 MG tablet 1 tablet by mouth two times daily as needed for anxiety 60 tablet 4  . [START ON 10/11/2019] diazepam (VALIUM) 5 MG tablet 1  qhs 30 tablet 4  . flurazepam (DALMANE) 30 MG capsule Take 1 capsule (30 mg total) by mouth at bedtime as needed for sleep. (Patient not taking: Reported on 01/16/2019) 30 capsule 0  . Ginger, Zingiber officinalis, (GINGER EXTRACT PO) Take by mouth.    . linaclotide (LINZESS) 145 MCG CAPS capsule Take 145 mcg by mouth daily as needed (costipation).     . magic mouthwash SOLN Take 5 mLs by mouth 3 (three) times daily as needed for mouth pain.    . mirtazapine (REMERON) 30 MG tablet Take 1 tablet (30 mg total) by mouth at bedtime. 30 tablet 4  . montelukast (SINGULAIR) 10 MG tablet Take 1 tablet (10 mg total) by mouth at bedtime. 90 tablet 0  . Multiple Vitamins-Minerals (MULTIVITAMIN WITH MINERALS) tablet Take 1 tablet by mouth every morning.     . NON FORMULARY 2 (two) times daily. [place]    .  nortriptyline (PAMELOR) 50 MG capsule 1 qhs 30 capsule 2  . nortriptyline (PAMELOR) 50 MG capsule Take 1 capsule  (50 mg total) by mouth at bedtime. 30 capsule 2  . Omega-3 Fatty Acids (FISH OIL PO) Take by mouth 2 (two) times a day.    . ondansetron (ZOFRAN) 8 MG tablet Take 1 tablet (8 mg total) by mouth every 8 (eight) hours as needed for nausea or vomiting. 20 tablet 3  . oxyCODONE-acetaminophen (PERCOCET) 10-325 MG tablet Take 1 tablet by mouth 2 (two) times daily.    . pantoprazole (PROTONIX) 40 MG tablet Take 1 tablet (40 mg total) by mouth daily. 30 tablet 0  . PAZEO 0.7 % SOLN PLACE 1 DROP INTO BOTH EYES ONCE DAILY as needed for allergies  3  . potassium chloride SA (K-DUR,KLOR-CON) 20 MEQ tablet Take 20 mEq by mouth 2 (two) times daily.     . SUMAtriptan (IMITREX) 20 MG/ACT nasal spray Use 1 spray in a single nostril. If the headache persists after 2 hours, may do 1 more spray. Do not exceed 2 sprays in 24 hours. 1 Inhaler 0  . topiramate (TOPAMAX) 50 MG tablet Take 1 tablet (50 mg total) by mouth 2 (two) times daily. 60 tablet 12  . triamterene-hydrochlorothiazide (DYAZIDE) 50-25 MG capsule Take 1 capsule by mouth daily.    . TURMERIC PO Take by mouth as needed.    . valACYclovir (VALTREX) 1000 MG tablet Take 1,000 mg by mouth daily.     . vitamin B-12 (CYANOCOBALAMIN) 1000 MCG tablet Take 1,000 mcg by mouth daily.     No current facility-administered medications for this visit.    Lab Results: No results found for this or any previous visit (from the past 48 hour(s)).  Physical Findings: AIMS:  , ,  ,  ,    CIWA:    COWS:     Musculoskeletal: Strength & Muscle Tone: within normal limits Gait & Station: normal Patient leans: N/A  Psychiatric Specialty Exam: ROS  There were no vitals taken for this visit.There is no height or weight on file to calculate BMI.  General Appearance: Casual  Eye Contact::  Good  Speech:  Clear and Coherent  Volume:  Normal  Mood:  Euthymic  Affect:  Congruent  Thought Process:  Coherent  Orientation:  Full (Time, Place, and Person)  Thought Content:   WDL  Suicidal Thoughts:  No  Homicidal Thoughts:  No  Memory:  NA  Judgement:  Good  Insight:  Fair  Psychomotor Activity:  Normal  Concentration:  Fair  Recall:  Good  Fund of Knowledge:Good  Language: Good  Akathisia:  No  Handed:  Right  AIMS (if indicated):     Assets:   ADL's:  Intact  Cognition: WNL  Sleep:       Treatment Plan  10/06/2019, 11:59 AM  This patient has 3 documented problems.  The first is that of major depression.  She now is taking Remeron and nortriptyline on a regular basis.  Her second problem is an adjustment disorder with an anxious mood state.  She takes Valium 2 mg and a 5 mg pill.  The patient is not suicidal.  She is been clean of drugs for many many years and does not drink alcohol.  She is very independent and actually she is functioning well.  She wants to have a support dog but her landlord is in control of whether or not that will happen.  There is no question  if this patient needs to move to a new setting.  She does not feel safe or secure where she is living.  She will return to see me in 2 and half months.

## 2019-11-07 NOTE — Telephone Encounter (Signed)
Patient receiving medication for $3.00 per pharmacy

## 2019-11-20 ENCOUNTER — Other Ambulatory Visit: Payer: Self-pay | Admitting: Neurology

## 2019-11-20 ENCOUNTER — Ambulatory Visit (HOSPITAL_COMMUNITY)
Admission: EM | Admit: 2019-11-20 | Discharge: 2019-11-20 | Disposition: A | Discharge status: Home / Self Care | Payer: Medicaid Other | Attending: Family Medicine | Admitting: Family Medicine

## 2019-11-20 ENCOUNTER — Telehealth: Payer: Self-pay | Admitting: Diagnostic Neuroimaging

## 2019-11-20 ENCOUNTER — Encounter (HOSPITAL_COMMUNITY): Payer: Self-pay

## 2019-11-20 ENCOUNTER — Other Ambulatory Visit: Payer: Self-pay

## 2019-11-20 DIAGNOSIS — F17210 Nicotine dependence, cigarettes, uncomplicated: Secondary | ICD-10-CM | POA: Insufficient documentation

## 2019-11-20 DIAGNOSIS — E119 Type 2 diabetes mellitus without complications: Secondary | ICD-10-CM | POA: Diagnosis not present

## 2019-11-20 DIAGNOSIS — Z79899 Other long term (current) drug therapy: Secondary | ICD-10-CM | POA: Insufficient documentation

## 2019-11-20 DIAGNOSIS — Z20822 Contact with and (suspected) exposure to covid-19: Secondary | ICD-10-CM | POA: Insufficient documentation

## 2019-11-20 DIAGNOSIS — F419 Anxiety disorder, unspecified: Secondary | ICD-10-CM | POA: Diagnosis not present

## 2019-11-20 DIAGNOSIS — Z86010 Personal history of colonic polyps: Secondary | ICD-10-CM | POA: Diagnosis not present

## 2019-11-20 DIAGNOSIS — Z881 Allergy status to other antibiotic agents status: Secondary | ICD-10-CM | POA: Diagnosis not present

## 2019-11-20 DIAGNOSIS — G43709 Chronic migraine without aura, not intractable, without status migrainosus: Secondary | ICD-10-CM | POA: Diagnosis not present

## 2019-11-20 DIAGNOSIS — M1990 Unspecified osteoarthritis, unspecified site: Secondary | ICD-10-CM | POA: Insufficient documentation

## 2019-11-20 DIAGNOSIS — J449 Chronic obstructive pulmonary disease, unspecified: Secondary | ICD-10-CM | POA: Diagnosis not present

## 2019-11-20 DIAGNOSIS — K219 Gastro-esophageal reflux disease without esophagitis: Secondary | ICD-10-CM | POA: Insufficient documentation

## 2019-11-20 DIAGNOSIS — Z8719 Personal history of other diseases of the digestive system: Secondary | ICD-10-CM | POA: Diagnosis not present

## 2019-11-20 DIAGNOSIS — E669 Obesity, unspecified: Secondary | ICD-10-CM | POA: Insufficient documentation

## 2019-11-20 DIAGNOSIS — R197 Diarrhea, unspecified: Secondary | ICD-10-CM | POA: Diagnosis not present

## 2019-11-20 DIAGNOSIS — G43809 Other migraine, not intractable, without status migrainosus: Secondary | ICD-10-CM | POA: Insufficient documentation

## 2019-11-20 DIAGNOSIS — Z8249 Family history of ischemic heart disease and other diseases of the circulatory system: Secondary | ICD-10-CM | POA: Diagnosis not present

## 2019-11-20 DIAGNOSIS — F259 Schizoaffective disorder, unspecified: Secondary | ICD-10-CM | POA: Insufficient documentation

## 2019-11-20 DIAGNOSIS — Z7951 Long term (current) use of inhaled steroids: Secondary | ICD-10-CM | POA: Insufficient documentation

## 2019-11-20 DIAGNOSIS — F319 Bipolar disorder, unspecified: Secondary | ICD-10-CM | POA: Insufficient documentation

## 2019-11-20 DIAGNOSIS — Z888 Allergy status to other drugs, medicaments and biological substances status: Secondary | ICD-10-CM | POA: Insufficient documentation

## 2019-11-20 DIAGNOSIS — Z833 Family history of diabetes mellitus: Secondary | ICD-10-CM | POA: Diagnosis not present

## 2019-11-20 DIAGNOSIS — I10 Essential (primary) hypertension: Secondary | ICD-10-CM | POA: Diagnosis not present

## 2019-11-20 DIAGNOSIS — E7800 Pure hypercholesterolemia, unspecified: Secondary | ICD-10-CM | POA: Diagnosis not present

## 2019-11-20 DIAGNOSIS — I272 Pulmonary hypertension, unspecified: Secondary | ICD-10-CM | POA: Insufficient documentation

## 2019-11-20 DIAGNOSIS — Z880 Allergy status to penicillin: Secondary | ICD-10-CM | POA: Diagnosis not present

## 2019-11-20 DIAGNOSIS — Z885 Allergy status to narcotic agent status: Secondary | ICD-10-CM | POA: Insufficient documentation

## 2019-11-20 LAB — SARS CORONAVIRUS 2 (TAT 6-24 HRS): SARS Coronavirus 2: NEGATIVE

## 2019-11-20 MED ORDER — TOPIRAMATE 100 MG PO TABS: 100.0000 mg | ORAL_TABLET | Freq: Two times a day (BID) | ORAL | 6 refills | Status: DC

## 2019-11-20 MED ORDER — SUMATRIPTAN 20 MG/ACT NA SOLN: NASAL | 6 refills | Status: DC

## 2019-11-20 MED ORDER — MAGIC MOUTHWASH: 5.0000 mL | Freq: Three times a day (TID) | ORAL | 0 refills | Status: DC | PRN

## 2019-11-20 NOTE — ED Triage Notes (Signed)
Pt reports migraine and diarrhea ax 1 week. States she took Topamax yesterday and did not relief the pain.

## 2019-11-20 NOTE — Telephone Encounter (Signed)
Patient presented to the lobby today requesting a call back from nurse regarding her headache medication and possible increase in dose. She wanted a sooner apt than her upcoming with Amy in November but there was not any. Best call back [phone]

## 2019-11-20 NOTE — Telephone Encounter (Signed)
Called patient who stated she is taking topiramate 50 mg twice daily. She is now having daily headaches, is out of Sumatriptan spray and Nurtec caused her to be very nauseated. She stated that when she had Sumatriptan spray it was working for her headaches. She is asking for refill of Sumatriptan spray and possibly increase in Topiramate, stated it is not causing her any side effects.  I advised Dr Leta Baptist is out of the office, will send to Dr Jaynee Eagles and call her back Patient verbalized understanding, appreciation.

## 2019-11-20 NOTE — Telephone Encounter (Signed)
Called patient and advised her of new Rx Dr Jaynee Eagles sent in. Patient verbalized understanding, appreciation.

## 2019-11-20 NOTE — Discharge Instructions (Addendum)
Testing you for covid based on your symptoms.  Medicines as needed for symptoms.  Rest, stay hydrated Follow up as needed for continued or worsening symptoms

## 2019-11-20 NOTE — Telephone Encounter (Signed)
I increased her topiramate and also send in the imitrex nasal thanks

## 2019-11-21 NOTE — ED Provider Notes (Signed)
[place]    CSN: [id number] Arrival date & time: 11/20/19  1214      History   Chief Complaint Chief Complaint  Patient presents with  . Migraine  . Diarrhea    HPI Gloria Lewis is a 64 y.o. female.   Pt is a 64 year old female that presents today with headaches and diarrhea.  Symptoms have been constant, waxing waning over the past week.  Has history of chronic migraines.  Took Topamax yesterday without much relief.  Has prescriptions at the pharmacy for more medication for her chronic migraines.  She is also had some diarrhea that is waxing and waning.  No abdominal pain, back pain, nausea, vomiting, fevers, cough, chest vision, nasal congestion, runny nose.  No known sick contacts or recent traveling.  No recent antibiotic use.  ROS per HPI      Past Medical History:  Diagnosis Date  . Anginal pain ([place])    admit 06/2014; had non-ischemic stress test  . Anxiety   . Bipolar 1 disorder ([place])   . Colon polyp   . CTS (carpal tunnel syndrome)   . Depression   . Diabetes mellitus without complication ([place])   . Fever blister   . GERD (gastroesophageal reflux disease)   . HA (headache)   . HTN (hypertension)   . Hypercholesterolemia   . Migraines   . OA (osteoarthritis)   . Schizo-affective psychosis [facility])     Patient Active Problem List   Diagnosis Date Noted  . Infectious gastroenteritis 04/16/2016  . Xerostomia 03/09/2016  . Surgery, elective 05/23/2015  . S/P arthroscopy of shoulder 05/23/2015  . Chronic migraine without aura without status migrainosus, not intractable 10/18/2014  . Tobacco abuse 10/18/2014  . Obesity 09/23/2014  . COPD 09/02/2014  . Pulmonary hypertension ([place]) 08/31/2014  . Respiratory failure with hypoxia ([place]) 08/14/2014  . Cigarette smoker 07/28/2014  . Major depressive disorder, recurrent episode, moderate ([place]) 07/06/2014  . Essential hypertension   . SOB (shortness of breath) 06/21/2014  . Precordial pain  06/21/2014  . Gastroesophageal reflux disease 06/21/2014  . Chest pain 06/21/2014  . HTN (hypertension)   . Neck pain 01/08/2014  . Major depressive disorder, recurrent episode, severe, without mention of psychotic behavior 10/14/2012  . Schizoaffective disorder ([place]) 05/26/2012  . Parathyroid adenoma 10/06/2010  . Hyperparathyroidism, primary ([place]) 10/06/2010  . DEGENERATIVE DISC DISEASE, LUMBOSACRAL SPINE 05/21/2007  . DERMATOPHYTOSIS OF THE BODY 05/10/2007  . Depressive type psychosis ([place]) 03/24/2007  . Anxiety state 03/24/2007  . DENTAL PAIN 03/24/2007  . SHOULDER PAIN, LEFT 03/24/2007    Past Surgical History:  Procedure Laterality Date  . ANTERIOR CERVICAL DECOMP/DISCECTOMY FUSION  08/27/2011   Procedure: ANTERIOR CERVICAL DECOMPRESSION/DISCECTOMY FUSION 1 LEVEL/HARDWARE REMOVAL;  Surgeon: Eustace Moore, MD;  Location: [place] [facility];  Service: Neurosurgery;  Laterality: Bilateral;  Cervical four-five Anterior cervical decompression/diskectomy, fusion, Plate, Removal of Cervical five-seven Plate  . back injection    . CARDIAC CATHETERIZATION N/A 11/09/2014   Procedure: Right Heart Cath;  Surgeon: Larey Dresser, MD;  Location: [place] CV LAB;  Service: Cardiovascular;  Laterality: N/A;  . CARPAL TUNNEL RELEASE  20110 rt/lt   rt x2 , lt x1  . COLONOSCOPY  06/2017   [facility]  . ESOPHAGOGASTRODUODENOSCOPY  06/2017   [facility]  . HEMORRHOID SURGERY    . MULTIPLE TOOTH EXTRACTIONS    . NECK SURGERY  2009  . PITUITARY SURGERY     Had  gland removed from producing too much calcium  . polp removed  2011  . RIGHT/LEFT HEART CATH AND CORONARY ANGIOGRAPHY N/A 07/05/2017   Procedure: RIGHT/LEFT HEART CATH AND CORONARY ANGIOGRAPHY;  Surgeon: Larey Dresser, MD;  Location: [place] CV LAB;  Service: Cardiovascular;  Laterality: N/A;  . SHOULDER ARTHROSCOPY WITH ROTATOR CUFF REPAIR Right 05/23/2015   Procedure: RIGHT SHOULDER ARTHROSCOPY WITH REMOVAL OF  SUTURE ANCHOR AND POSSIBLE REVISION ROTATOR CUFF REPAIR;  Surgeon: Justice Britain, MD;  Location: [place];  Service: Orthopedics;  Laterality: Right;  . SHOULDER ARTHROSCOPY WITH SUBACROMIAL DECOMPRESSION Right 01/24/2015   Procedure: RIGHT SHOULDER ARTHROSCOPY WITH SUBACROMIAL DECOMPRESSION AD DISTAL CLAVICLE RESECTION ;  Surgeon: Justice Britain, MD;  Location: [place];  Service: Orthopedics;  Laterality: Right;  Marland Kitchen VAGINAL DELIVERY     x3    OB History   No obstetric history on file.      Home Medications    Prior to Admission medications   Medication Sig Start Date End Date Taking? Authorizing Provider  budesonide-formoterol (SYMBICORT) 160-4.5 MCG/ACT inhaler Inhale 2 puffs into the lungs 2 (two) times daily.    [provider]  Calcium-Magnesium-Zinc (CAL-MAG-ZINC PO) Take by mouth daily.    [provider]  cetirizine (ZYRTEC ALLERGY) 10 MG tablet Take 1 tablet (10 mg total) by mouth daily. 08/17/19   Jaynee Eagles, PA-C  cholecalciferol (VITAMIN D) 1000 UNITS tablet Take 1,000 Units by mouth daily.    [provider]  diazepam (VALIUM) 2 MG tablet 1 tablet by mouth two times daily as needed for anxiety 10/11/19   Plovsky, Berneta Sages, MD  diazepam (VALIUM) 5 MG tablet 1  qhs 10/11/19   Plovsky, Berneta Sages, MD  flurazepam [facility] 30 MG capsule Take 1 capsule (30 mg total) by mouth at bedtime as needed for sleep. Patient not taking: Reported on 01/16/2019 10/20/18   Norma Fredrickson, MD  Ginger, Zingiber officinalis, (GINGER EXTRACT PO) Take by mouth.    [provider]  linaclotide (LINZESS) 145 MCG CAPS capsule Take 145 mcg by mouth daily as needed (costipation).     [provider]  magic mouthwash SOLN Take 5 mLs by mouth 3 (three) times daily as needed for mouth pain. 11/20/19   Loura Halt A, NP  mirtazapine (REMERON) 30 MG tablet Take 1 tablet (30 mg total) by mouth at bedtime. 10/06/19 10/05/20  Plovsky, Berneta Sages, MD  montelukast (SINGULAIR) 10 MG tablet Take 1  tablet (10 mg total) by mouth at bedtime. 08/17/19   Jaynee Eagles, PA-C  Multiple Vitamins-Minerals (MULTIVITAMIN WITH MINERALS) tablet Take 1 tablet by mouth every morning.     [provider]  NON FORMULARY 2 (two) times daily. [place]    [provider]  nortriptyline (PAMELOR) 50 MG capsule 1 qhs 04/19/19   Plovsky, Berneta Sages, MD  nortriptyline (PAMELOR) 50 MG capsule Take 1 capsule (50 mg total) by mouth at bedtime. 10/06/19 10/05/20  Plovsky, Berneta Sages, MD  Omega-3 Fatty Acids (FISH OIL PO) Take by mouth 2 (two) times a day.    [provider]  ondansetron (ZOFRAN) 8 MG tablet Take 1 tablet (8 mg total) by mouth every 8 (eight) hours as needed for nausea or vomiting. 11/14/18   Pieter Partridge, DO  oxyCODONE-acetaminophen (PERCOCET) 10-325 MG tablet Take 1 tablet by mouth 2 (two) times daily.    [provider]  pantoprazole (PROTONIX) 40 MG tablet Take 1 tablet (40 mg total) by mouth daily. 09/15/19   Lucrezia Starch, MD  PAZEO 0.7 % SOLN PLACE 1 DROP INTO BOTH EYES ONCE DAILY as needed for allergies 08/09/17   [provider]  potassium chloride SA (K-DUR,KLOR-CON) 20 MEQ tablet Take 20 mEq by mouth 2 (two) times daily.     [provider]  SUMAtriptan (IMITREX) 20 MG/ACT nasal spray Use 1 spray in a single nostril. If the headache persists after 2 hours, may do 1 more spray. Do not exceed 2 sprays in 24 hours. 11/20/19   Melvenia Beam, MD  topiramate (TOPAMAX) 100 MG tablet Take 1 tablet (100 mg total) by mouth 2 (two) times daily. 11/20/19   Melvenia Beam, MD  triamterene-hydrochlorothiazide (DYAZIDE) 50-25 MG capsule Take 1 capsule by mouth daily.    [provider]  TURMERIC PO Take by mouth as needed.    [provider]  valACYclovir (VALTREX) 1000 MG tablet Take 1,000 mg by mouth daily.     [provider]  vitamin B-12 (CYANOCOBALAMIN) 1000 MCG tablet Take 1,000 mcg by mouth daily.    [provider]    Dexlansoprazole 30 MG capsule Take 30 mg by mouth daily.  08/17/19  [provider]  furosemide (LASIX) 20 MG tablet Take 20 mg by mouth.  08/17/19  [provider]    Family History Family History  Problem Relation Age of Onset  . Coronary artery disease Father   . Cancer Father        head neck   . Esophageal cancer Father   . Hypertension Mother   . Schizophrenia Mother   . Diabetes Mother   . Heart attack Mother   . Depression Brother   . Suicidality Brother   . Prostate cancer Brother   . Cancer Brother        bone marrow  . Schizophrenia Maternal Grandmother   . Anesthesia problems Neg Hx   . Hypotension Neg Hx   . Malignant hyperthermia Neg Hx   . Pseudochol deficiency Neg Hx   . Allergic rhinitis Neg Hx   . Angioedema Neg Hx   . Asthma Neg Hx   . Atopy Neg Hx   . Eczema Neg Hx   . Immunodeficiency Neg Hx   . Urticaria Neg Hx   . Breast cancer Neg Hx     Social History Social History   Tobacco Use  . Smoking status: Current Some Day Smoker    Packs/day: 0.10    Years: 30.00    Pack years: 3.00    Types: Cigarettes  . Smokeless tobacco: Never Used  Vaping Use  . Vaping Use: Some days  Substance Use Topics  . Alcohol use: No    Alcohol/week: 0.0 standard drinks  . Drug use: No    Comment: hx crack addiction 2008     Allergies   Effexor [venlafaxine hydrochloride], Latex, Penicillins, Zithromax [azithromycin dihydrate], Amlodipine, Atorvastatin, Butrans [buprenorphine], Codeine, Nucynta [tapentadol], Nurtec [rimegepant sulfate], Vicodin [hydrocodone-acetaminophen], Chantix [varenicline tartrate], Paroxetine hcl, and Tramadol   Review of Systems Review of Systems   Physical Exam Triage Vital Signs ED Triage Vitals  Enc Vitals Group     BP 11/20/19 1325 121/74     Pulse Rate 11/20/19 1325 (!) 103     Resp 11/20/19 1325 18     Temp 11/20/19 1325 98.5 F (36.9 C)     Temp Source 11/20/19 1325 Oral     SpO2 11/20/19 1325 100 %      Weight --      Height --  Head Circumference --      Peak Flow --      Pain Score 11/20/19 1323 8     Pain Loc --      Pain Edu? --      Excl. in [place]? --    No data found.  Updated Vital Signs BP 121/74 (BP Location: Right Arm)   Pulse (!) 103   Temp 98.5 F (36.9 C) (Oral)   Resp 18   SpO2 100%   Visual Acuity Right Eye Distance:   Left Eye Distance:   Bilateral Distance:    Right Eye Near:   Left Eye Near:    Bilateral Near:     Physical Exam Vitals and nursing note reviewed.  Constitutional:      General: She is not in acute distress.    Appearance: Normal appearance. She is not ill-appearing, toxic-appearing or diaphoretic.  HENT:     Head: Normocephalic.     Nose: Nose normal.  Eyes:     Conjunctiva/sclera: Conjunctivae normal.  Pulmonary:     Effort: Pulmonary effort is normal.  Musculoskeletal:        General: Normal range of motion.     Cervical back: Normal range of motion.  Skin:    General: Skin is warm and dry.     Findings: No rash.  Neurological:     General: No focal deficit present.     Mental Status: She is alert.  Psychiatric:        Mood and Affect: Mood normal.      UC Treatments / Results  Labs (all labs ordered are listed, but only abnormal results are displayed) Labs Reviewed  SARS CORONAVIRUS 2 (TAT 6-24 HRS)    EKG   Radiology No results found.  Procedures Procedures (including critical care time)  Medications Ordered in UC Medications - No data to display  Initial Impression / Assessment and Plan / UC Course  I have reviewed the triage vital signs and the nursing notes.  Pertinent labs & imaging results that were available during my care of the patient were reviewed by me and considered in my medical decision making (see chart for details).     Diarrhea and headache Most likely some sort of viral illness. Covid swab pending.  Recommended stay hydrated to avoid dehydration Recommended feeling the  medications that her doctor sent to the pharmacy for headaches and start this today. Follow up as needed for continued or worsening symptoms  Final Clinical Impressions(s) / UC Diagnoses   Final diagnoses:  Diarrhea, unspecified type  Other migraine without status migrainosus, not intractable     Discharge Instructions     Testing you for covid based on your symptoms.  Medicines as needed for symptoms.  Rest, stay hydrated Follow up as needed for continued or worsening symptoms     ED Prescriptions    Medication Sig Dispense Auth. Provider   magic mouthwash SOLN Take 5 mLs by mouth 3 (three) times daily as needed for mouth pain. 100 mL Loura Halt A, NP     PDMP not reviewed this encounter.   Orvan July, NP 11/21/19 1616

## 2019-12-08 ENCOUNTER — Ambulatory Visit (HOSPITAL_COMMUNITY): Payer: Self-pay | Admitting: Psychiatry

## 2019-12-20 ENCOUNTER — Other Ambulatory Visit: Payer: Self-pay

## 2019-12-20 ENCOUNTER — Ambulatory Visit (INDEPENDENT_AMBULATORY_CARE_PROVIDER_SITE_OTHER): Payer: Medicaid Other | Admitting: Psychiatry

## 2019-12-20 DIAGNOSIS — F325 Major depressive disorder, single episode, in full remission: Secondary | ICD-10-CM | POA: Diagnosis not present

## 2019-12-20 MED ORDER — DIAZEPAM 5 MG PO TABS: ORAL_TABLET | ORAL | 4 refills | Status: DC

## 2019-12-20 MED ORDER — MIRTAZAPINE 30 MG PO TABS: 30.0000 mg | ORAL_TABLET | Freq: Every day | ORAL | 4 refills | Status: DC

## 2019-12-20 MED ORDER — NORTRIPTYLINE HCL 50 MG PO CAPS: 50.0000 mg | ORAL_CAPSULE | Freq: Every day | ORAL | 2 refills | Status: DC

## 2019-12-20 MED ORDER — HYDROXYZINE PAMOATE 25 MG PO CAPS
ORAL_CAPSULE | ORAL | 5 refills | Status: DC
Start: 2019-12-20 — End: 2020-02-14

## 2019-12-20 NOTE — Progress Notes (Signed)
Patient ID: Gloria Lewis, female   DOB: 30-Jan-1956, 64 y.o.   MRN: [id number] [facility] MD Progress Note  12/20/2019 3:43 PM Gloria Lewis  MRN:  [id number] Subjective:  Shoulder hurting Principal Problem: Major Depression,recurent Mild Diagnosis: Adjustment disorder with an anxious mood state   Today the patient is doing well.  Her mood is stable.  She was having a lot of anxiety a month or so ago but instead of dwelling on her anxiety she went down to [place] because of her 40-month-old Financial planner.  She brought her great-grandson back to [place] which she has been taking care of them for about a month and a half.  The child is doing great.  The patient is very responsive.  His help to refocus herself.  The patient denies any other depression.  She is sleeping fairly well and eating well.  Her environment seems to be less impacting her.  She is still planning to eventually move.  She realisticallycould have a hard time to get assistance to mood.  She is unhappy with her lawyer who supposedly been helping her.  He thinks he has ulterior motives.  Patient continues to lose weight on her own.  She likes to exercise.  The patient has no psychotic symptoms she denies any use of alcohol or drugs.  She is clean for many years.  Is been some confusion to the pharmacy systems about taking a Valium 5 mg and 2 mg as well.  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  She is 3 credits away from getting a psychology.  At a local college.  The patient has her own website which is not activated.  She is looking forward to getting back into school.  The patient goes to [employer] on a regular basis.  Patient Active Problem List   Diagnosis Date Noted  . Infectious gastroenteritis [A09] 04/16/2016  . Xerostomia [K11.7] 03/09/2016  . Surgery, elective [Z41.9] 05/23/2015  . S/P arthroscopy of shoulder [Z98.890] 05/23/2015  . Chronic migraine without aura without status migrainosus, not  intractable [G43.709] 10/18/2014  . Tobacco abuse [Z72.0] 10/18/2014  . Obesity [E66.9] 09/23/2014  . COPD [J44.9] 09/02/2014  . Pulmonary hypertension ([place]) [I27.20] 08/31/2014  . Respiratory failure with hypoxia ([place]) [J96.91] 08/14/2014  . Cigarette smoker [F17.210] 07/28/2014  . Major depressive disorder, recurrent episode, moderate ([place]) [F33.1] 07/06/2014  . Essential hypertension [I10]   . SOB (shortness of breath) [R06.02] 06/21/2014  . Precordial pain [R07.2] 06/21/2014  . Gastroesophageal reflux disease [K21.9] 06/21/2014  . Chest pain [R07.9] 06/21/2014  . HTN (hypertension) [I10]   . Neck pain [M54.2] 01/08/2014  . Major depressive disorder, recurrent episode, severe, without mention of psychotic behavior [F33.2] 10/14/2012  . Schizoaffective disorder ([place]) [F25.9] 05/26/2012  . 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 Hyperparathyroidism, primary ([place]) [E21.0] 10/06/2010  . DEGENERATIVE DISC DISEASE, LUMBOSACRAL SPINE [M51.37] 05/21/2007  . DERMATOPHYTOSIS OF THE BODY [B35.4] 05/10/2007  . 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 Anxiety state [F41.1] 03/24/2007  . DENTAL PAIN [K08.9] 03/24/2007  . SHOULDER PAIN, LEFT [M25.519] 03/24/2007   Total Time spent with patient:30 min  Past Psychiatric History:   Past Medical History:  Past Medical History:  Diagnosis Date  . Anginal pain ([place])    admit 06/2014; had non-ischemic stress test  . Anxiety   . Bipolar 1 disorder ([place])   . Colon polyp   . CTS (carpal tunnel syndrome)   . Depression   .  Diabetes mellitus without complication ([place])   . Fever blister   . GERD (gastroesophageal reflux disease)   . HA (headache)   . HTN (hypertension)   . Hypercholesterolemia   . Migraines   . OA (osteoarthritis)   . Schizo-affective psychosis ([place])     Past Surgical History:  Procedure Laterality Date  . ANTERIOR CERVICAL DECOMP/DISCECTOMY FUSION  08/27/2011   Procedure: ANTERIOR CERVICAL  DECOMPRESSION/DISCECTOMY FUSION 1 LEVEL/HARDWARE REMOVAL;  Surgeon: Eustace Moore, MD;  Location: [place] [facility];  Service: Neurosurgery;  Laterality: Bilateral;  Cervical four-five Anterior cervical decompression/diskectomy, fusion, Plate, Removal of Cervical five-seven Plate  . back injection    . CARDIAC CATHETERIZATION N/A 11/09/2014   Procedure: Right Heart Cath;  Surgeon: Larey Dresser, MD;  Location: [place] [facility];  Service: Cardiovascular;  Laterality: N/A;  . CARPAL TUNNEL RELEASE  20110 rt/lt   rt x2 , lt x1  . COLONOSCOPY  06/2017   [facility]  . ESOPHAGOGASTRODUODENOSCOPY  06/2017   [facility]  . HEMORRHOID SURGERY    . MULTIPLE TOOTH EXTRACTIONS    . NECK SURGERY  2009  . PITUITARY SURGERY     Had gland removed from producing too much calcium  . polp removed  2011  . RIGHT/LEFT HEART CATH AND CORONARY ANGIOGRAPHY N/A 07/05/2017   Procedure: RIGHT/LEFT HEART CATH AND CORONARY ANGIOGRAPHY;  Surgeon: Larey Dresser, MD;  Location: [place] CV [facility];  Service: Cardiovascular;  Laterality: N/A;  . SHOULDER ARTHROSCOPY WITH ROTATOR CUFF REPAIR Right 05/23/2015   Procedure: RIGHT SHOULDER ARTHROSCOPY WITH REMOVAL OF SUTURE ANCHOR AND POSSIBLE REVISION ROTATOR CUFF REPAIR;  Surgeon: Justice Britain, MD;  Location: [place];  Service: Orthopedics;  Laterality: Right;  . SHOULDER ARTHROSCOPY WITH SUBACROMIAL DECOMPRESSION Right 01/24/2015   Procedure: RIGHT SHOULDER ARTHROSCOPY WITH SUBACROMIAL DECOMPRESSION AD DISTAL CLAVICLE RESECTION ;  Surgeon: Justice Britain, MD;  Location: [place];  Service: Orthopedics;  Laterality: Right;  Marland Kitchen VAGINAL DELIVERY     x3   Family History:  Family History  Problem Relation Age of Onset  . Coronary artery disease Father   . Cancer Father        head neck   . Esophageal cancer Father   . Hypertension Mother   . Schizophrenia Mother   . Diabetes Mother   . Heart attack Mother   . Depression Brother   . Suicidality Brother   .  Prostate cancer Brother   . Cancer Brother        bone marrow  . Schizophrenia Maternal Grandmother   . Anesthesia problems Neg Hx   . Hypotension Neg Hx   . Malignant hyperthermia Neg Hx   . Pseudochol deficiency Neg Hx   . Allergic rhinitis Neg Hx   . Angioedema Neg Hx   . Asthma Neg Hx   . Atopy Neg Hx   . Eczema Neg Hx   . Immunodeficiency Neg Hx   . Urticaria Neg Hx   . Breast cancer Neg Hx    Family Psychiatric  History:  Social History:  Social History   Substance and Sexual Activity  Alcohol Use No  . Alcohol/week: 0.0 standard drinks     Social History   Substance and Sexual Activity  Drug Use No   Comment: hx crack addiction 2008    Social History   Socioeconomic History  . Marital status: Single    Spouse name: Not on file  . Number of children: 3  . Years  of education: 70  . Highest education level: Some college, no degree  Occupational History  . Occupation: disabled/retired  Tobacco Use  . Smoking status: Current Some Day Smoker    Packs/day: 0.10    Years: 30.00    Pack years: 3.00    Types: Cigarettes  . Smokeless tobacco: Never Used  Vaping Use  . Vaping Use: Some days  Substance and Sexual Activity  . Alcohol use: No    Alcohol/week: 0.0 standard drinks  . Drug use: No    Comment: hx crack addiction 2008  . Sexual activity: Never  Other Topics Concern  . Not on file  Social History Narrative   Lives in a two story home alone      [place] in [place] for pain management      Right handed   12th grade      Caffeine coffee 1 cup /day   Social Determinants of Health   Financial Resource Strain:   . Difficulty of Paying Living Expenses: Not on file  Food Insecurity:   . Worried About Charity fundraiser in the Last Year: Not on file  . Ran Out of Food in the Last Year: Not on file  Transportation Needs:   . Lack of Transportation (Medical): Not on file  . Lack of Transportation (Non-Medical): Not on file  Physical Activity:    . Days of Exercise per Week: Not on file  . Minutes of Exercise per Session: Not on file  Stress:   . Feeling of Stress : Not on file  Social Connections:   . Frequency of Communication with Friends and Family: Not on file  . Frequency of Social Gatherings with Friends and Family: Not on file  . Attends Religious Services: Not on file  . Active Member of Clubs or Organizations: Not on file  . Attends Archivist Meetings: Not on file  . Marital Status: Not on file   Additional Social History:                         Sleep: Good  Appetite:  Fair  Current Medications: Current Outpatient Medications  Medication Sig Dispense Refill  . nortriptyline (PAMELOR) 50 MG capsule 1 qhs 30 capsule 2  . budesonide-formoterol (SYMBICORT) 160-4.5 MCG/ACT inhaler Inhale 2 puffs into the lungs 2 (two) times daily.    . Calcium-Magnesium-Zinc (CAL-MAG-ZINC PO) Take by mouth daily.    . cetirizine (ZYRTEC ALLERGY) 10 MG tablet Take 1 tablet (10 mg total) by mouth daily. 90 tablet 0  . cholecalciferol (VITAMIN D) 1000 UNITS tablet Take 1,000 Units by mouth daily.    . diazepam (VALIUM) 5 MG tablet 1  bid 60 tablet 4  . flurazepam (DALMANE) 30 MG capsule Take 1 capsule (30 mg total) by mouth at bedtime as needed for sleep. (Patient not taking: Reported on 01/16/2019) 30 capsule 0  . Ginger, Zingiber officinalis, (GINGER EXTRACT PO) Take by mouth.    . hydrOXYzine (VISTARIL) 25 MG capsule 1  bid 60 capsule 5  . linaclotide (LINZESS) 145 MCG CAPS capsule Take 145 mcg by mouth daily as needed (costipation).     . magic mouthwash SOLN Take 5 mLs by mouth 3 (three) times daily as needed for mouth pain. 100 mL 0  . mirtazapine (REMERON) 30 MG tablet Take 1 tablet (30 mg total) by mouth at bedtime. 30 tablet 4  . montelukast (SINGULAIR) 10 MG tablet Take 1 tablet (10  mg total) by mouth at bedtime. 90 tablet 0  . Multiple Vitamins-Minerals (MULTIVITAMIN WITH MINERALS) tablet Take 1 tablet  by mouth every morning.     . NON FORMULARY 2 (two) times daily. [place]    . nortriptyline (PAMELOR) 50 MG capsule Take 1 capsule (50 mg total) by mouth at bedtime. 30 capsule 2  . Omega-3 Fatty Acids (FISH OIL PO) Take by mouth 2 (two) times a day.    . ondansetron (ZOFRAN) 8 MG tablet Take 1 tablet (8 mg total) by mouth every 8 (eight) hours as needed for nausea or vomiting. 20 tablet 3  . oxyCODONE-acetaminophen (PERCOCET) 10-325 MG tablet Take 1 tablet by mouth 2 (two) times daily.    . pantoprazole (PROTONIX) 40 MG tablet Take 1 tablet (40 mg total) by mouth daily. 30 tablet 0  . PAZEO 0.7 % SOLN PLACE 1 DROP INTO BOTH EYES ONCE DAILY as needed for allergies  3  . potassium chloride SA (K-DUR,KLOR-CON) 20 MEQ tablet Take 20 mEq by mouth 2 (two) times daily.     . SUMAtriptan (IMITREX) 20 MG/ACT nasal spray Use 1 spray in a single nostril. If the headache persists after 2 hours, may do 1 more spray. Do not exceed 2 sprays in 24 hours. 6 each 6  . topiramate (TOPAMAX) 100 MG tablet Take 1 tablet (100 mg total) by mouth 2 (two) times daily. 60 tablet 6  . triamterene-hydrochlorothiazide (DYAZIDE) 50-25 MG capsule Take 1 capsule by mouth daily.    . TURMERIC PO Take by mouth as needed.    . valACYclovir (VALTREX) 1000 MG tablet Take 1,000 mg by mouth daily.     . vitamin B-12 (CYANOCOBALAMIN) 1000 MCG tablet Take 1,000 mcg by mouth daily.     No current facility-administered medications for this visit.    Lab Results: No results found for this or any previous visit (from the past 48 hour(s)).  Physical Findings: AIMS:  , ,  ,  ,    CIWA:    COWS:     Musculoskeletal: Strength & Muscle Tone: within normal limits Gait & Station: normal Patient leans: N/A  Psychiatric Specialty Exam: ROS  There were no vitals taken for this visit.There is no height or weight on file to calculate BMI.  General Appearance: Casual  Eye Contact::  Good  Speech:  Clear and Coherent  Volume:  Normal   Mood:  Euthymic  Affect:  Congruent  Thought Process:  Coherent  Orientation:  Full (Time, Place, and Person)  Thought Content:  WDL  Suicidal Thoughts:  No  Homicidal Thoughts:  No  Memory:  NA  Judgement:  Good  Insight:  Fair  Psychomotor Activity:  Normal  Concentration:  Fair  Recall:  Good  Fund of Knowledge:Good  Language: Good  Akathisia:  No  Handed:  Right  AIMS (if indicated):     Assets:   ADL's:  Intact  Cognition: WNL  Sleep:       Treatment Plan  12/20/2019, 3:43 PM  This patient has 3 problems.  At this time she is doing very well.  Her first problem is that of major depression.  Patient takes Remeron and nortriptyline does very well.  She has no vegetative symptoms.  Her second problem is an adjustment disorder with an anxious mood state.  At this time to make it simple were going to slightly increase her Valium to 5 mg twice daily.  We will also go ahead and give her Vistaril  25 mg twice daily she said that has worked well in the past.  Her third problem is substance abuse disorder.  Patient attends AA on a regular basis.  Patient is functioning extremely well.  He is very independent.  Patient to return to see me in 3 months.

## 2019-12-22 ENCOUNTER — Ambulatory Visit (HOSPITAL_COMMUNITY): Payer: Self-pay | Admitting: Psychiatry

## 2020-01-19 ENCOUNTER — Other Ambulatory Visit (HOSPITAL_COMMUNITY): Payer: Self-pay | Admitting: Psychiatry

## 2020-02-01 ENCOUNTER — Telehealth: Payer: Self-pay | Admitting: Diagnostic Neuroimaging

## 2020-02-01 NOTE — Telephone Encounter (Addendum)
Called patient and asked what injection she received. She went to urgent care, received Toradol which gave immediate relief. She is using sumatriptan sparingly, doesn't tolerate nurtec, it causes GERD. She asked what else she can do before her follow up 02/14/20. I advised will send to Dr Leta Baptist and let her know, may not get reply until Mon. Advised she may try tylenol, ibuprofen. She stated she has to be careful due to her GERD. Advised we have MD on call over weekend. Patient verbalized understanding, appreciation.

## 2020-02-01 NOTE — Telephone Encounter (Signed)
Pt. States topiramate (TOPAMAX) 100 MG tablet 2x is not doing anything. She states she has had 1 injection & the migraines are still severe. She is asking for advice as to what to do.

## 2020-02-06 NOTE — Telephone Encounter (Signed)
Can try qulipta. Otherwise could ask psychiatry to try depakote. -VRP

## 2020-02-06 NOTE — Telephone Encounter (Addendum)
Called patient and advised of Dr Gladstone Lighter recommendations. She stated that she has been under a lot of stress lately but has not had a migraine. She wants to stay with her current medications for migraines. She verbalized understanding, appreciation.

## 2020-02-14 ENCOUNTER — Encounter: Payer: Self-pay | Admitting: Family Medicine

## 2020-02-14 ENCOUNTER — Ambulatory Visit: Payer: Medicaid Other | Admitting: Family Medicine

## 2020-02-14 VITALS — BP 129/86 | HR 80 | Ht 67.0 in | Wt 215.0 lb

## 2020-02-14 DIAGNOSIS — I771 Stricture of artery: Secondary | ICD-10-CM | POA: Diagnosis not present

## 2020-02-14 DIAGNOSIS — G43109 Migraine with aura, not intractable, without status migrainosus: Secondary | ICD-10-CM

## 2020-02-14 MED ORDER — UBRELVY 50 MG PO TABS: 50.0000 mg | ORAL_TABLET | Freq: Every day | ORAL | 11 refills | Status: DC | PRN

## 2020-02-14 NOTE — Patient Instructions (Signed)
Below is our plan:  We will continue topiramate 100mg  twice daily. I will replace Nurtec with Ubrelvy. Please take 1 tablet at onset of headache. May take 1 additional tablet in 2 hours if needed. Do not take more than 2 tablets in 24 hours or more than 10 in a month.    Please make sure you are staying well hydrated. I recommend 50-60 ounces daily. Well balanced diet and regular exercise encouraged. Consider journal for headaches.    Please continue follow up with care team as directed. Follow up with psychiatry regularly.   Follow up in 6 months   You may receive a survey regarding today's visit. I encourage you to leave honest feed back as I do use this information to improve patient care. Thank you for seeing me today!      Migraine Headache A migraine headache is a very strong throbbing pain on one side or both sides of your head. This type of headache can also cause other symptoms. It can last from 4 hours to 3 days. Talk with your doctor about what things may bring on (trigger) this condition. What are the causes? The exact cause of this condition is not known. This condition may be triggered or caused by:  Drinking alcohol.  Smoking.  Taking medicines, such as: ? Medicine used to treat chest pain (nitroglycerin). ? Birth control pills. ? Estrogen. ? Some blood pressure medicines.  Eating or drinking certain products.  Doing physical activity. Other things that may trigger a migraine headache include:  Having a menstrual period.  Pregnancy.  Hunger.  Stress.  Not getting enough sleep or getting too much sleep.  Weather changes.  Tiredness (fatigue). What increases the risk?  Being 77-31 years old.  Being female.  Having a family history of migraine headaches.  Being Caucasian.  Having depression or anxiety.  Being very overweight. What are the signs or symptoms?  A throbbing pain. This pain may: ? Happen in any area of the head, such as on  one side or both sides. ? Make it hard to do daily activities. ? Get worse with physical activity. ? Get worse around bright lights or loud noises.  Other symptoms may include: ? Feeling sick to your stomach (nauseous). ? Vomiting. ? Dizziness. ? Being sensitive to bright lights, loud noises, or smells.  Before you get a migraine headache, you may get warning signs (an aura). An aura may include: ? Seeing flashing lights or having blind spots. ? Seeing bright spots, halos, or zigzag lines. ? Having tunnel vision or blurred vision. ? Having numbness or a tingling feeling. ? Having trouble talking. ? Having weak muscles.  Some people have symptoms after a migraine headache (postdromal phase), such as: ? Tiredness. ? Trouble thinking (concentrating). How is this treated?  Taking medicines that: ? Relieve pain. ? Relieve the feeling of being sick to your stomach. ? Prevent migraine headaches.  Treatment may also include: ? Having acupuncture. ? Avoiding foods that bring on migraine headaches. ? Learning ways to control your body functions (biofeedback). ? Therapy to help you know and deal with negative thoughts (cognitive behavioral therapy). Follow these instructions at home: Medicines  Take over-the-counter and prescription medicines only as told by your doctor.  Ask your doctor if the medicine prescribed to you: ? Requires you to avoid driving or using heavy machinery. ? Can cause trouble pooping (constipation). You may need to take these steps to prevent or treat trouble pooping:  Drink enough  fluid to keep your pee (urine) pale yellow.  Take over-the-counter or prescription medicines.  Eat foods that are high in fiber. These include beans, whole grains, and fresh fruits and vegetables.  Limit foods that are high in fat and sugar. These include fried or sweet foods. Lifestyle  Do not drink alcohol.  Do not use any products that contain nicotine or tobacco, such  as cigarettes, e-cigarettes, and chewing tobacco. If you need help quitting, ask your doctor.  Get at least 8 hours of sleep every night.  Limit and deal with stress. General instructions      Keep a journal to find out what may bring on your migraine headaches. For example, write down: ? What you eat and drink. ? How much sleep you get. ? Any change in what you eat or drink. ? Any change in your medicines.  If you have a migraine headache: ? Avoid things that make your symptoms worse, such as bright lights. ? It may help to lie down in a dark, quiet room. ? Do not drive or use heavy machinery. ? Ask your doctor what activities are safe for you.  Keep all follow-up visits as told by your doctor. This is important. Contact a doctor if:  You get a migraine headache that is different or worse than others you have had.  You have more than 15 headache days in one month. Get help right away if:  Your migraine headache gets very bad.  Your migraine headache lasts longer than 72 hours.  You have a fever.  You have a stiff neck.  You have trouble seeing.  Your muscles feel weak or like you cannot control them.  You start to lose your balance a lot.  You start to have trouble walking.  You pass out (faint).  You have a seizure. Summary  A migraine headache is a very strong throbbing pain on one side or both sides of your head. These headaches can also cause other symptoms.  This condition may be treated with medicines and changes to your lifestyle.  Keep a journal to find out what may bring on your migraine headaches.  Contact a doctor if you get a migraine headache that is different or worse than others you have had.  Contact your doctor if you have more than 15 headache days in a month. This information is not intended to replace advice given to you by your health care provider. Make sure you discuss any questions you have with your health care provider. Document  Revised: 07/15/2018 Document Reviewed: 05/05/2018 Elsevier Patient Education  [place].

## 2020-02-14 NOTE — Progress Notes (Signed)
Chief Complaint  Patient presents with  . Follow-up    RM 2  . Migraine    pt said she his having more headaches.      HISTORY OF PRESENT ILLNESS: Today 02/14/20  Gloria Lewis is a 64 y.o. female here today for follow up for migraine with aura. She was restarted on topiramate 50mg  BID and Nurtec PRN. Dose was increased to 100mg  tiwice daily. She has continued nortriptyline 50mg  at bedtime prescribed by psychiatry. She is also taking Valium 5mg  BID and Remeron 30mg  daily for mood management. She feels that headaches wax and wane. She reports having 22 headache days a month. She has tried Nurtec about 6 times and felt it worsened Acid Reflux. It was effective in abortion therapy. She is taking Dexilant 30mg  daily for GERD.    MRI showed artery tortuosity most likely from HTN and small vessel disease. On Dyazide and fish oil, no statin. She has talked to PCP about having MRA/CTA but has decided to wait at this time. She has been working on diet changes over the past 3 years. She has lost about 60 pounds. She reports huge amounts of stress. She lives in an apartment complex. She is having a hard time with her landlord. She is not sleeping well. She tries to sleep when her neighbors are awake to avoid conflicts. She does not have any other place she can live. [place] is on [address]. She feels that this contributes to her headaches. She is seeing psychiatrist every 3 months. She is walking at least 5 days a week. She does not snore. No daytime sleepiness.    HISTORY (copied from Dr Gladstone Lighter note on 08/14/2019)  64 year old female here for evaluation of headaches. Patient has had headaches since childhood with migraine features, throbbing severe headaches with nausea and sensitivity to light. Sometimes she sees visual disturbance before her headache starts like "raining" pattern. Patient averages three headaches per week. Triggers include stress, allergies and pain. She reports family history of  brain aneurysm. Patient had several falls and injuries when she was younger where she hit her head on the ground.  Currently patient is struggling with right rotator cuff pain on pain medication. She does exercise five times per week. She drinks 1 cup of tea per day. Patient has remote history of substance abuse more than 25 years ago, currently abstinent. She does smoke cigarettes and is trying to quit. No alcohol use.  Patient is tried some migraine medications including topiramate, Imitrex, Maxalt, Ajovy, muscle relaxers, narcotics, benzodiazepines, trigger point injections without relief. In the past Topamax seem to work the best without any side effects.    REVIEW OF SYSTEMS: Out of a complete 14 system review of symptoms, the patient complains only of the following symptoms, headaches, anxiety, chronic pain and all other reviewed systems are negative.   ALLERGIES: Allergies  Allergen Reactions  . Effexor [Venlafaxine Hydrochloride] Itching and Other (See Comments)    headache  . Latex Itching and Rash  . Penicillins Hives    Has patient had a PCN reaction causing immediate rash, facial/tongue/throat swelling, SOB or lightheadedness with hypotension: Yes Has patient had a PCN reaction causing severe rash involving mucus membranes or skin necrosis: No Has patient had a PCN reaction that required hospitalization No Has patient had a PCN reaction occurring within the last 10 years: No If all of the above answers are "NO", then may proceed with Cephalosporin use.   Marland Kitchen Zithromax [Azithromycin Dihydrate]  Swelling  . Amlodipine Swelling  . Atorvastatin Swelling    Muscle aches  . Butrans [Buprenorphine] Other (See Comments)    Ulcers-"mouth would not heal"  . Codeine Itching    Tolerable with benadryl  . Nucynta [Tapentadol] Other (See Comments)    Ulcers inside of mouth  . Nurtec [Rimegepant Sulfate] Nausea Only  . Vicodin [Hydrocodone-Acetaminophen] Itching  . Chantix  [Varenicline Tartrate] Nausea Only  . Paroxetine Hcl Other (See Comments)    headache  . Tramadol Other (See Comments)    Pt states it interacted with her sertraline, but she is no longer on sertraline.  She does not remember the type of reaction she had.      HOME MEDICATIONS: Outpatient Medications Prior to Visit  Medication Sig Dispense Refill  . cetirizine (ZYRTEC ALLERGY) 10 MG tablet Take 1 tablet (10 mg total) by mouth daily. 90 tablet 0  . cholecalciferol (VITAMIN D) 1000 UNITS tablet Take 1,000 Units by mouth daily.    . diazepam (VALIUM) 5 MG tablet 1  bid 60 tablet 4  . Ginger, Zingiber officinalis, (GINGER EXTRACT PO) Take by mouth.    . magic mouthwash SOLN Take 5 mLs by mouth 3 (three) times daily as needed for mouth pain. 100 mL 0  . mirtazapine (REMERON) 30 MG tablet Take 1 tablet (30 mg total) by mouth at bedtime. 30 tablet 4  . NON FORMULARY 2 (two) times daily. [place]    . Omega-3 Fatty Acids (FISH OIL PO) Take by mouth 2 (two) times a day.    . ondansetron (ZOFRAN) 8 MG tablet Take 1 tablet (8 mg total) by mouth every 8 (eight) hours as needed for nausea or vomiting. 20 tablet 3  . oxyCODONE-acetaminophen (PERCOCET) 10-325 MG tablet Take 1 tablet by mouth 2 (two) times daily.    . potassium chloride SA (K-DUR,KLOR-CON) 20 MEQ tablet Take 20 mEq by mouth 2 (two) times daily.     . SUMAtriptan (IMITREX) 20 MG/ACT nasal spray Use 1 spray in a single nostril. If the headache persists after 2 hours, may do 1 more spray. Do not exceed 2 sprays in 24 hours. 6 each 6  . topiramate (TOPAMAX) 100 MG tablet Take 1 tablet (100 mg total) by mouth 2 (two) times daily. 60 tablet 6  . triamterene-hydrochlorothiazide (DYAZIDE) 50-25 MG capsule Take 1 capsule by mouth daily.    . TURMERIC PO Take by mouth as needed.    . valACYclovir (VALTREX) 1000 MG tablet Take 1,000 mg by mouth daily.     . vitamin B-12 (CYANOCOBALAMIN) 1000 MCG tablet Take 1,000 mcg by mouth daily.    Marland Kitchen linaclotide  (LINZESS) 145 MCG CAPS capsule Take 145 mcg by mouth daily as needed (costipation).     . Multiple Vitamins-Minerals (MULTIVITAMIN WITH MINERALS) tablet Take 1 tablet by mouth every morning.     . nortriptyline (PAMELOR) 50 MG capsule Take 1 capsule (50 mg total) by mouth at bedtime. 30 capsule 2  . nortriptyline (PAMELOR) 50 MG capsule 1 qhs 30 capsule 2  . PAZEO 0.7 % SOLN PLACE 1 DROP INTO BOTH EYES ONCE DAILY as needed for allergies  3  . budesonide-formoterol (SYMBICORT) 160-4.5 MCG/ACT inhaler Inhale 2 puffs into the lungs 2 (two) times daily.    . Calcium-Magnesium-Zinc (CAL-MAG-ZINC PO) Take by mouth daily.    . flurazepam (DALMANE) 30 MG capsule Take 1 capsule (30 mg total) by mouth at bedtime as needed for sleep. (Patient not taking: Reported on 01/16/2019)  30 capsule 0  . hydrOXYzine (VISTARIL) 25 MG capsule 1  bid 60 capsule 5  . montelukast (SINGULAIR) 10 MG tablet Take 1 tablet (10 mg total) by mouth at bedtime. 90 tablet 0  . pantoprazole (PROTONIX) 40 MG tablet Take 1 tablet (40 mg total) by mouth daily. 30 tablet 0   No facility-administered medications prior to visit.     PAST MEDICAL HISTORY: Past Medical History:  Diagnosis Date  . Anginal pain ([place])    admit 06/2014; had non-ischemic stress test  . Anxiety   . Bipolar 1 disorder ([place])   . Colon polyp   . CTS (carpal tunnel syndrome)   . Depression   . Diabetes mellitus without complication ([place])   . Fever blister   . GERD (gastroesophageal reflux disease)   . HA (headache)   . HTN (hypertension)   . Hypercholesterolemia   . Migraines   . OA (osteoarthritis)   . Schizo-affective psychosis ([place])      PAST SURGICAL HISTORY: Past Surgical History:  Procedure Laterality Date  . ANTERIOR CERVICAL DECOMP/DISCECTOMY FUSION  08/27/2011   Procedure: ANTERIOR CERVICAL DECOMPRESSION/DISCECTOMY FUSION 1 LEVEL/HARDWARE REMOVAL;  Surgeon: Eustace Moore, MD;  Location: [facility];  Service: Neurosurgery;  Laterality:  Bilateral;  Cervical four-five Anterior cervical decompression/diskectomy, fusion, Plate, Removal of Cervical five-seven Plate  . back injection    . CARDIAC CATHETERIZATION N/A 11/09/2014   Procedure: Right Heart Cath;  Surgeon: Larey Dresser, MD;  Location: [facility];  Service: Cardiovascular;  Laterality: N/A;  . CARPAL TUNNEL RELEASE  20110 rt/lt   rt x2 , lt x1  . COLONOSCOPY  06/2017   [facility]  . ESOPHAGOGASTRODUODENOSCOPY  06/2017   [facility]  . HEMORRHOID SURGERY    . MULTIPLE TOOTH EXTRACTIONS    . NECK SURGERY  2009  . PITUITARY SURGERY     Had gland removed from producing too much calcium  . polp removed  2011  . RIGHT/LEFT HEART CATH AND CORONARY ANGIOGRAPHY N/A 07/05/2017   Procedure: RIGHT/LEFT HEART CATH AND CORONARY ANGIOGRAPHY;  Surgeon: Larey Dresser, MD;  Location: [facility];  Service: Cardiovascular;  Laterality: N/A;  . SHOULDER ARTHROSCOPY WITH ROTATOR CUFF REPAIR Right 05/23/2015   Procedure: RIGHT SHOULDER ARTHROSCOPY WITH REMOVAL OF SUTURE ANCHOR AND POSSIBLE REVISION ROTATOR CUFF REPAIR;  Surgeon: Justice Britain, MD;  Location: [facility];  Service: Orthopedics;  Laterality: Right;  . SHOULDER ARTHROSCOPY WITH SUBACROMIAL DECOMPRESSION Right 01/24/2015   Procedure: RIGHT SHOULDER ARTHROSCOPY WITH SUBACROMIAL DECOMPRESSION AD DISTAL CLAVICLE RESECTION ;  Surgeon: Justice Britain, MD;  Location: [facility];  Service: Orthopedics;  Laterality: Right;  Marland Kitchen VAGINAL DELIVERY     x3     FAMILY HISTORY: Family History  Problem Relation Age of Onset  . Coronary artery disease Father   . Cancer Father        head neck   . Esophageal cancer Father   . Hypertension Mother   . Schizophrenia Mother   . Diabetes Mother   . Heart attack Mother   . Depression Brother   . Suicidality Brother   . Prostate cancer Brother   . Cancer Brother        bone marrow  . Schizophrenia Maternal Grandmother   . Anesthesia problems Neg Hx   .  Hypotension Neg Hx   . Malignant hyperthermia Neg Hx   . Pseudochol deficiency Neg Hx   . Allergic rhinitis Neg Hx   .  Angioedema Neg Hx   . Asthma Neg Hx   . Atopy Neg Hx   . Eczema Neg Hx   . Immunodeficiency Neg Hx   . Urticaria Neg Hx   . Breast cancer Neg Hx      SOCIAL HISTORY: Social History   Socioeconomic History  . Marital status: Single    Spouse name: Not on file  . Number of children: 3  . Years of education: 41  . Highest education level: Some college, no degree  Occupational History  . Occupation: disabled/retired  Tobacco Use  . Smoking status: Current Some Day Smoker    Packs/day: 0.10    Years: 30.00    Pack years: 3.00    Types: Cigarettes  . Smokeless tobacco: Never Used  Vaping Use  . Vaping Use: Some days  Substance and Sexual Activity  . Alcohol use: No    Alcohol/week: 0.0 standard drinks  . Drug use: No    Comment: hx crack addiction 2008  . Sexual activity: Never  Other Topics Concern  . Not on file  Social History Narrative   Lives in a two story home alone      [place] in [place] for pain management      Right handed   12th grade      Caffeine coffee 1 cup /day   Social Determinants of Health   Financial Resource Strain:   . Difficulty of Paying Living Expenses: Not on file  Food Insecurity:   . Worried About Charity fundraiser in the Last Year: Not on file  . Ran Out of Food in the Last Year: Not on file  Transportation Needs:   . Lack of Transportation (Medical): Not on file  . Lack of Transportation (Non-Medical): Not on file  Physical Activity:   . Days of Exercise per Week: Not on file  . Minutes of Exercise per Session: Not on file  Stress:   . Feeling of Stress : Not on file  Social Connections:   . Frequency of Communication with Friends and Family: Not on file  . Frequency of Social Gatherings with Friends and Family: Not on file  . Attends Religious Services: Not on file  . Active Member of Clubs or  Organizations: Not on file  . Attends Archivist Meetings: Not on file  . Marital Status: Not on file  Intimate Partner Violence:   . Fear of Current or Ex-Partner: Not on file  . Emotionally Abused: Not on file  . Physically Abused: Not on file  . Sexually Abused: Not on file      PHYSICAL EXAM  Vitals:   02/14/20 0916  BP: 129/86  Pulse: 80  Weight: 215 lb (97.5 kg)  Height: 5\' 7"  (1.702 m)   Body mass index is 33.67 kg/m.   Generalized: Well developed, in no acute distress   Cardiology: Normal rate and rhythm, no murmur auscultated Respiratory: Clear to auscultation bilaterally  Neurological examination  Mentation: Alert oriented to time, place, history taking. Follows all commands speech and language fluent Cranial nerve II-XII: Pupils were equal round reactive to light. Extraocular movements were full, visual field were full  Motor: The motor testing reveals 5 over 5 strength of all 4 extremities. Good symmetric motor tone is noted throughout.  Gait and station: Gait is normal.     DIAGNOSTIC DATA (LABS, IMAGING, TESTING) - I reviewed patient records, labs, notes, testing and imaging myself where available.  Lab Results  Component  Value Date   WBC 7.0 09/15/2019   HGB 14.1 09/15/2019   HCT 42.6 09/15/2019   MCV 86.2 09/15/2019   PLT 381 09/15/2019      Component Value Date/Time   NA 139 09/15/2019 1256   K 3.7 09/15/2019 1256   CL 101 09/15/2019 1256   CO2 27 09/15/2019 1256   GLUCOSE 97 09/15/2019 1256   BUN 19 09/15/2019 1256   CREATININE 0.97 09/15/2019 1256   CALCIUM 9.6 09/15/2019 1256   PROT 6.8 11/06/2017 1147   ALBUMIN 3.7 11/06/2017 1147   AST 32 11/06/2017 1147   ALT 20 11/06/2017 1147   ALKPHOS 104 11/06/2017 1147   BILITOT 0.7 11/06/2017 1147   GFRNONAA >60 09/15/2019 1256   GFRAA >60 09/15/2019 1256   Lab Results  Component Value Date   CHOL 176 08/11/2017   HDL 43 08/11/2017   LDLCALC 95 08/11/2017   TRIG 189 (H)  08/11/2017   CHOLHDL 4.1 08/11/2017   Lab Results  Component Value Date   HGBA1C 6.2 (H) 08/11/2017   Lab Results  Component Value Date   VITAMINB12 473 08/11/2017   Lab Results  Component Value Date   TSH 1.015 08/11/2017      ASSESSMENT AND PLAN  64 y.o. year old female  has a past medical history of Anginal pain ([place]), Anxiety, Bipolar 1 disorder ([place]), Colon polyp, CTS (carpal tunnel syndrome), Depression, Diabetes mellitus without complication ([place]), Fever blister, GERD (gastroesophageal reflux disease), HA (headache), HTN (hypertension), Hypercholesterolemia, Migraines, OA (osteoarthritis), and Schizo-affective psychosis ([place]). here with   Tortuosity of artery (H[facility])  Migraine with aura and without status migrainosus, not intractable  Allie continues to have regular headache days but reports improvement on topiramate 100 mg twice daily.  We will continue current treatment plan.  I will have her try Roselyn Meier for abortive therapy.  She was educated on appropriate administration and possible side effects of this medication.  She will continue to follow-up closely with primary care.  Consider imaging as indicated per PCP. No obvious concerns of aneurysm on last MRI or given in history. Healthy lifestyle habits encouraged.  Continue close follow-up with psychiatry.  Stress management discussed.  She will follow up with [place] in 6 months.   I spent 20 minutes of face-to-face and non-face-to-face time with patient.  This included previsit chart review, lab review, study review, order entry, electronic health record documentation, patient education.    Debbora Presto, MSN, FNP-C 02/14/2020, 9:31 AM  [facility] [address], [place], [place] [address] [phone]

## 2020-02-21 ENCOUNTER — Other Ambulatory Visit (HOSPITAL_COMMUNITY): Payer: Self-pay | Admitting: Psychiatry

## 2020-02-21 MED ORDER — DIAZEPAM 2 MG PO TABS: ORAL_TABLET | ORAL | 4 refills | Status: DC

## 2020-02-27 NOTE — Progress Notes (Signed)
I reviewed note and agree with plan.   Penni Bombard, MD 25/11/7197, 4:12 PM Certified in Neurology, Neurophysiology and Neuroimaging  [facility] [address] [place], [place] [address] [phone]

## 2020-03-12 ENCOUNTER — Telehealth: Payer: Self-pay | Admitting: *Deleted

## 2020-03-12 NOTE — Telephone Encounter (Signed)
PA for UBRELVY [place] TRACKS form faxed with confirmation. Determination pending.

## 2020-03-13 ENCOUNTER — Telehealth: Payer: Self-pay | Admitting: *Deleted

## 2020-03-13 NOTE — Telephone Encounter (Signed)
Initiated on [place]. After receiving fax.

## 2020-03-14 NOTE — Telephone Encounter (Signed)
I redid this yesterday determination is pending.

## 2020-03-14 NOTE — Telephone Encounter (Signed)
Pt called, was told by Manuela Neptune do not have a PA for Ubrogepant (UBRELVY) 50 MG TABS. [facility] said system was down for a week. They are requesting another PA. Would like a call from the nurse.

## 2020-03-15 ENCOUNTER — Other Ambulatory Visit (HOSPITAL_COMMUNITY): Payer: Self-pay | Admitting: *Deleted

## 2020-03-15 MED ORDER — NORTRIPTYLINE HCL 50 MG PO CAPS: ORAL_CAPSULE | ORAL | 0 refills | Status: DC

## 2020-03-18 MED ORDER — SUMATRIPTAN 20 MG/ACT NA SOLN: NASAL | 0 refills | Status: DC

## 2020-03-18 NOTE — Telephone Encounter (Signed)
[place] tracks, denied UBRELVY.

## 2020-03-18 NOTE — Addendum Note (Signed)
Addended by: Brandon Melnick on: 03/18/2020 03:12 PM   Modules accepted: Orders

## 2020-03-18 NOTE — Telephone Encounter (Signed)
She also stated cannot do nurtec affects her GERD.  If she has issue with getting the Roselyn Meier will call us back and will tray another triptan.

## 2020-03-18 NOTE — Telephone Encounter (Signed)
She can continue Nurtec as this has helped with abortive therapy. Have her follow up with PCP if acid refuls continues to be an issue for her. If Nurtec is not tolerated, we will have to try a different triptan like eletriptan.

## 2020-03-18 NOTE — Telephone Encounter (Signed)
I called pt and Roselyn Meier was denied per [place] tracks.  She states that [employer] she thought said was approved.  She will try to get .  She is to take ubrelvy or sumatriptan nasal spray.  Just got one from pharmacy.  She is aware to use one or the other.  I placed the sumatriptan NS back on her med list.

## 2020-03-22 ENCOUNTER — Other Ambulatory Visit: Payer: Self-pay

## 2020-03-22 ENCOUNTER — Ambulatory Visit (INDEPENDENT_AMBULATORY_CARE_PROVIDER_SITE_OTHER): Payer: Medicaid Other | Admitting: Psychiatry

## 2020-03-22 DIAGNOSIS — F324 Major depressive disorder, single episode, in partial remission: Secondary | ICD-10-CM

## 2020-03-22 MED ORDER — DIAZEPAM 5 MG PO TABS: ORAL_TABLET | ORAL | 3 refills | Status: DC

## 2020-03-22 MED ORDER — DIAZEPAM 2 MG PO TABS: ORAL_TABLET | ORAL | 4 refills | Status: DC

## 2020-03-22 MED ORDER — MIRTAZAPINE 30 MG PO TABS: 30.0000 mg | ORAL_TABLET | Freq: Every day | ORAL | 4 refills | Status: DC

## 2020-03-22 MED ORDER — NORTRIPTYLINE HCL 50 MG PO CAPS: ORAL_CAPSULE | ORAL | 0 refills | Status: DC
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
## 2020-04-16 ENCOUNTER — Telehealth: Payer: Self-pay | Admitting: Family Medicine

## 2020-04-16 NOTE — Telephone Encounter (Signed)
Sarah from [facility] called to state that PA for Ubrogepant (UBRELVY) 50 MG TABS was denied. She stated they have faxed over the appeal & wanted to know if it has been received.  Ref. Key: [id number]

## 2020-04-17 MED ORDER — ELETRIPTAN HYDROBROMIDE 20 MG PO TABS: 20.0000 mg | ORAL_TABLET | ORAL | 0 refills | Status: DC | PRN

## 2020-04-17 NOTE — Addendum Note (Signed)
Addended by: Debbora Presto L on: 04/17/2020 04:41 PM   Modules accepted: Orders

## 2020-04-17 NOTE — Telephone Encounter (Signed)
Received form

## 2020-04-18 ENCOUNTER — Other Ambulatory Visit (HOSPITAL_COMMUNITY): Payer: Self-pay | Admitting: Psychiatry

## 2020-04-19 ENCOUNTER — Other Ambulatory Visit (HOSPITAL_COMMUNITY): Payer: Self-pay | Admitting: *Deleted

## 2020-04-19 MED ORDER — DIAZEPAM 2 MG PO TABS: ORAL_TABLET | ORAL | 1 refills | Status: DC

## 2020-04-29 NOTE — Telephone Encounter (Signed)
Completed and singed faxed back to [facility]. [phone].

## 2020-04-30 NOTE — Telephone Encounter (Signed)
I called pt and I relayed since Gloria Lewis was denied.  Eletriptan was ordered.  I called the pharmacy after speaking with pt and they did have prescription for eletriptan but needed prior authorization. Will do.

## 2020-05-01 NOTE — Telephone Encounter (Signed)
She has tried both Imitrex and Maxalt. Is PA denied even though she has tried and failed both formulary drugs? Id so, I am not sure she has many options other than paying cash.

## 2020-05-01 NOTE — Telephone Encounter (Signed)
Pt called, can not afford eletriptan (RELPAX) 20 MG tablet at [facility]. Could you try [place] on GoodRx. Will be cheaper and I have a [place] card. Would like a call from the nurse.

## 2020-05-01 NOTE — Telephone Encounter (Signed)
PA denied for relpax 04-29-20. Did you want to try rizatriptan?

## 2020-05-01 NOTE — Telephone Encounter (Signed)
Goodrx.com is showing [facility] to be more expensive. I called the patient and she would like to leave the prescription at [facility] for now. Says she woke up with a migraine but the pain has gotten better with hydration and rest. If the medication does get approved by Medicaid, then she would like a new prescription sent to her regular pharmacy ([facility] [address]). She was informed that Lovey Newcomer is checking on the PA for her.

## 2020-05-01 NOTE — Telephone Encounter (Signed)
Resubmitted PA to [facility] (tried imitrex and maxalt). Quantity 10/ 30 days.

## 2020-05-01 NOTE — Telephone Encounter (Signed)
I will need to check on the eletriptan PA form faxed to [facility].

## 2020-05-09 ENCOUNTER — Telehealth (HOSPITAL_COMMUNITY): Payer: Self-pay | Admitting: *Deleted

## 2020-05-09 NOTE — Telephone Encounter (Signed)
Pt called stating that she is having "a crisis" with her landlord and unable to leave her apt. Pt says she hasn't sleep but 3 hours since 05/06/20. Pt states that the HS Valium is not helping. Please review.

## 2020-05-10 ENCOUNTER — Other Ambulatory Visit (HOSPITAL_COMMUNITY): Payer: Self-pay

## 2020-05-10 MED ORDER — ESZOPICLONE 3 MG PO TABS
3.0000 mg | ORAL_TABLET | Freq: Every evening | ORAL | 3 refills | Status: DC | PRN
Start: 2020-05-10 — End: 2020-10-09

## 2020-05-14 MED ORDER — UBRELVY 50 MG PO TABS: ORAL_TABLET | ORAL | 5 refills | Status: DC

## 2020-05-14 NOTE — Telephone Encounter (Signed)
Noted  

## 2020-05-14 NOTE — Addendum Note (Signed)
Addended by: Brandon Melnick on: 05/14/2020 01:38 PM   Modules accepted: Orders

## 2020-05-14 NOTE — Telephone Encounter (Signed)
I reviewed on [place] [employer] approval for a year 05-10-20 for both relpax and ubrelvy.  I spoke to pt and relayed that per Amy, NP that she is to use ubrelvy, as would be a better choice then another triptan (relpax was option only if ubrelvy denied.  Pt is aware and will [place] ubrelvy.

## 2020-05-14 NOTE — Telephone Encounter (Signed)
Fantastic news! Let me know if you need me.

## 2020-05-16 ENCOUNTER — Encounter (INDEPENDENT_AMBULATORY_CARE_PROVIDER_SITE_OTHER): Payer: Self-pay

## 2020-05-23 ENCOUNTER — Encounter: Payer: Self-pay | Admitting: Physical Medicine and Rehabilitation

## 2020-05-23 ENCOUNTER — Other Ambulatory Visit: Payer: Self-pay

## 2020-05-23 ENCOUNTER — Encounter
Payer: Medicaid Other | Attending: Physical Medicine and Rehabilitation | Admitting: Physical Medicine and Rehabilitation

## 2020-05-23 VITALS — BP 116/81 | HR 84 | Temp 98.5°F | Ht 67.0 in | Wt 221.0 lb

## 2020-05-23 DIAGNOSIS — M25511 Pain in right shoulder: Secondary | ICD-10-CM | POA: Insufficient documentation

## 2020-05-23 DIAGNOSIS — K219 Gastro-esophageal reflux disease without esophagitis: Secondary | ICD-10-CM | POA: Diagnosis present

## 2020-05-23 DIAGNOSIS — F411 Generalized anxiety disorder: Secondary | ICD-10-CM | POA: Diagnosis not present

## 2020-05-23 DIAGNOSIS — G8929 Other chronic pain: Secondary | ICD-10-CM | POA: Insufficient documentation

## 2020-05-23 DIAGNOSIS — G894 Chronic pain syndrome: Secondary | ICD-10-CM | POA: Diagnosis present

## 2020-05-23 MED ORDER — LIDOCAINE 5 % EX PTCH: 1.0000 | MEDICATED_PATCH | CUTANEOUS | 0 refills | Status: DC

## 2020-05-23 NOTE — Progress Notes (Addendum)
Subjective:    Patient ID: Gloria Lewis, female    DOB: 1955/08/06, 65 y.o.   MRN: [id number]  HPI  Gloria Lewis is a 65 year old woman who presents to establish care for GERD.  1) Chronic pain syndrome secondary to right shoulder pain -She has had two surgeries -She just had an injection last week -She has been taking turmeric, ginger, teas- she wants to be off all medication before age 67.  -Pain on average is 8/10 -She takes Percocet 10-325 three times per day for a little over three years.   2) Life stressors -she lives in a controlled environment.  -She lost her daughter -She has a son with schizophrenia who had an exacerbation last week.   3) Anxiety: -She takes Valium and Buspar.   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    Pain Inventory Average Pain 8 Pain Right Now 8 My pain is sharp, burning, stabbing, tingling and aching  In the last 24 hours, has pain interfered with the following? General activity 8 Relation with others 8 Enjoyment of life 8 What TIME of day is your pain at its worst? evening and night Sleep (in general) Poor  Pain is worse with: bending, inactivity and some activites Pain improves with: rest, heat/ice, therapy/exercise, pacing activities, medication and TENS Relief from Meds: 7  walk without assistance how many minutes can you walk? 30-60 ability to climb steps?  yes do you drive?  yes  disabled: date disabled 05/2006 retired I need assistance with the following:  household duties and shopping  numbness tingling spasms dizziness depression anxiety loss of taste or smell  Any changes since last visit?  no   Had shoulder injection 05/14/20  Primary care [facility]    Family History  Problem Relation Age of Onset  . Coronary artery  disease Father   . Cancer Father        head neck   . Esophageal cancer Father   . Hypertension Mother   . Schizophrenia Mother   . Diabetes Mother   . Heart attack Mother   . Depression Brother   . Suicidality Brother   . Prostate cancer Brother   . Cancer Brother        bone marrow  . Schizophrenia Maternal Grandmother   . Anesthesia problems Neg Hx   . Hypotension Neg Hx   . Malignant hyperthermia Neg Hx   . Pseudochol deficiency Neg Hx   . Allergic rhinitis Neg Hx   . Angioedema Neg Hx   . Asthma Neg Hx   . Atopy Neg Hx   . Eczema Neg Hx   . Immunodeficiency Neg Hx   . Urticaria Neg Hx   . Breast cancer Neg Hx    Social History   Socioeconomic History  . Marital status: Single    Spouse name: Not on file  . Number of children: 3  . Years of education: 22  . Highest education level: Some college, no degree  Occupational History  . Occupation: disabled/retired  Tobacco Use  . Smoking status: Current Some Day Smoker    Packs/day: 0.10    Years: 30.00    Pack years: 3.00    Types: Cigarettes  . Smokeless tobacco: Never Used  Vaping Use  . Vaping  Use: Some days  Substance and Sexual Activity  . Alcohol use: No    Alcohol/week: 0.0 standard drinks  . Drug use: No    Comment: hx crack addiction 2008  . Sexual activity: Never  Other Topics Concern  . Not on file  Social History Narrative   Lives in a two story home alone      [place]      Right handed   12th grade      Caffeine coffee 1 cup /day   Social Determinants of Health   Financial Resource Strain: Not on file  Food Insecurity: Not on file  Transportation Needs: Not on file  Physical Activity: Not on file  Stress: Not on file  Social Connections: Not on file   Past Surgical History:  Procedure Laterality Date  . ANTERIOR CERVICAL DECOMP/DISCECTOMY FUSION  08/27/2011   Procedure: ANTERIOR CERVICAL DECOMPRESSION/DISCECTOMY FUSION 1 LEVEL/HARDWARE REMOVAL;   Surgeon: Eustace Moore, MD;  Location: [place] [facility];  Service: Neurosurgery;  Laterality: Bilateral;  Cervical four-five Anterior cervical decompression/diskectomy, fusion, Plate, Removal of Cervical five-seven Plate  . back injection    . CARDIAC CATHETERIZATION N/A 11/09/2014   Procedure: Right Heart Cath;  Surgeon: Larey Dresser, MD;  Location: [place] [facility];  Service: Cardiovascular;  Laterality: N/A;  . CARPAL TUNNEL RELEASE  20110 rt/lt   rt x2 , lt x1  . COLONOSCOPY  06/2017   [facility]  . ESOPHAGOGASTRODUODENOSCOPY  06/2017   [facility]  . HEMORRHOID SURGERY    . MULTIPLE TOOTH EXTRACTIONS    . NECK SURGERY  2009  . PITUITARY SURGERY     Had gland removed from producing too much calcium  . polp removed  2011  . RIGHT/LEFT HEART CATH AND CORONARY ANGIOGRAPHY N/A 07/05/2017   Procedure: RIGHT/LEFT HEART CATH AND CORONARY ANGIOGRAPHY;  Surgeon: Larey Dresser, MD;  Location: [place] [facility];  Service: Cardiovascular;  Laterality: N/A;  . SHOULDER ARTHROSCOPY WITH ROTATOR CUFF REPAIR Right 05/23/2015   Procedure: RIGHT SHOULDER ARTHROSCOPY WITH REMOVAL OF SUTURE ANCHOR AND POSSIBLE REVISION ROTATOR CUFF REPAIR;  Surgeon: Justice Britain, MD;  Location: [place];  Service: Orthopedics;  Laterality: Right;  . SHOULDER ARTHROSCOPY WITH SUBACROMIAL DECOMPRESSION Right 01/24/2015   Procedure: RIGHT SHOULDER ARTHROSCOPY WITH SUBACROMIAL DECOMPRESSION AD DISTAL CLAVICLE RESECTION ;  Surgeon: Justice Britain, MD;  Location: [place];  Service: Orthopedics;  Laterality: Right;  Marland Kitchen VAGINAL DELIVERY     x3   Past Medical History:  Diagnosis Date  . Anginal pain ([place])    admit 06/2014; had non-ischemic stress test  . Anxiety   . Bipolar 1 disorder ([place])   . Colon polyp   . CTS (carpal tunnel syndrome)   . Depression   . Diabetes mellitus without complication ([place])   . Fever blister   . GERD (gastroesophageal reflux disease)   . HA (headache)   . HTN (hypertension)   .  Hypercholesterolemia   . Migraines   . OA (osteoarthritis)   . Schizo-affective psychosis ([facility])    BP 116/81   Pulse 84   Temp 98.5 F (36.9 C)   Ht 5\' 7"  (1.702 m)   Wt 221 lb (100.2 kg)   SpO2 97%   BMI 34.61 kg/m   Opioid Risk Score:   Fall Risk Score:  `1  Depression screen PHQ 2/9  Depression screen [facility] 2/9 05/23/2020 01/11/2018 01/04/2018 12/28/2017  Decreased Interest 1 0  0 0  Down, Depressed, Hopeless 1 0 1 1  PHQ - 2 Score 2 0 1 1  Altered sleeping 2 - - -  Tired, decreased energy 1 - - -  Change in appetite 1 - - -  Feeling bad or failure about yourself  0 - - -  Trouble concentrating 0 - - -  Moving slowly or fidgety/restless 0 - - -  Suicidal thoughts 0 - - -  PHQ-9 Score 6 - - -  Difficult doing work/chores Not difficult at all - - -  Some recent data might be hidden    Review of Systems  Constitutional: Positive for unexpected weight change.  HENT: Negative.   Eyes: Negative.   Respiratory: Positive for cough, shortness of breath and wheezing.   Cardiovascular: Negative.   Gastrointestinal: Positive for constipation, nausea and vomiting.  Endocrine:       High and low blood sugars  Genitourinary: Negative.   Musculoskeletal: Positive for back pain.       Right shoulder pain/  spasms  Skin: Negative.   Allergic/Immunologic: Positive for environmental allergies.  Neurological: Positive for dizziness and numbness.       Tingling  Hematological: Negative.   Psychiatric/Behavioral: Positive for dysphoric mood. The patient is nervous/anxious.   All other systems reviewed and are negative.      Objective:   Physical Exam Gen: no distress, normal appearing HEENT: oral mucosa pink and moist, NCAT Cardio: Reg rate Chest: normal effort, normal rate of breathing Abd: soft, non-distended Ext: no edema Psych: pleasant, normal affect Skin: intact Neuro: Alert and oriented x3.  Musculoskeletal:  Right shoulder: limited abduction, elevation, severely  limited external rotation.      Assessment & Plan:  Gloria Lewis is a 65 year old woman who presents to establish care for right shoulder pain.  1) Chronic Pain Syndrome secondary to right shoulder pain s/p failed surgery -Discussed current symptoms of pain and history of pain.  -Discussed benefits of exercise in reducing pain. -LFTs reviewed and normal: can take tylenol as needed.  -Try to wean Percocet to twice per day.  -Discussed following foods that may reduce pain: 1) Ginger 2) Blueberries 3) Salmon 4) Pumpkin seeds 5) dark chocolate 6) turmeric 7) tart cherries 8) virgin olive oil 9) chilli peppers 10) mint  Link to further information on diet for chronic pain: [email or web address].[email or web address]  2) Migraines: -has had all her life  -usually triggered by not enough sleep.  -She takes Topamax -She is afraid of Botox  3) Insomnia: -Try to go outside near sunrise -Get exercise during the day.  -Discussed good sleep hygiene: turning off all devices an hour before bedtime.  -Chamomile tea with dinner.  -Can consider over the counter melatonin - due to her stressors, needing to drive son to work at 5am in the morning.  -Educated regarding the addictive potential

## 2020-05-23 NOTE — Patient Instructions (Signed)
-  Discussed following foods that may reduce pain: 1) Ginger 2) Blueberries 3) Salmon 4) Pumpkin seeds 5) dark chocolate 6) turmeric 7) tart cherries 8) virgin olive oil 9) chilli peppers 10) mint  Link to further information on diet for chronic pain: [email or web address]

## 2020-05-29 ENCOUNTER — Other Ambulatory Visit: Payer: Self-pay

## 2020-05-29 ENCOUNTER — Telehealth (INDEPENDENT_AMBULATORY_CARE_PROVIDER_SITE_OTHER): Payer: Medicaid Other | Admitting: Psychiatry

## 2020-05-29 DIAGNOSIS — F330 Major depressive disorder, recurrent, mild: Secondary | ICD-10-CM

## 2020-05-29 MED ORDER — FETZIMA 20 MG PO CP24: ORAL_CAPSULE | ORAL | 3 refills | Status: DC

## 2020-05-29 MED ORDER — CLONAZEPAM 1 MG PO TABS
ORAL_TABLET | ORAL | 3 refills | Status: DC
Start: 2020-05-29 — End: 2020-07-04

## 2020-05-29 NOTE — Progress Notes (Signed)
Patient ID: Gloria Lewis, female   DOB: 03-04-1956, 65 y.o.   MRN: [id number] [facility] MD Progress Note  05/29/2020 3:05 PM Gloria Lewis  MRN:  [id number] Subjective:  Shoulder hurting Principal Problem: Major Depression,recurent Mild Diagnosis: Adjustment disorder with an anxious mood stat  Today the patient is only doing fairly well.  She is dealing with a lot of problems.  She had a petition and getting committed her son who presently is in the emergency room in [place] under petition.  In fact both of her sons have been in car accidents in the last few weeks.  But her son from [place] is the one whose in the emergency room now and has a chronic mental illness.  Patient is very much an advocate for him.  Patient is very involved with [employer] on line and with the mental health Association..  The patient's mood is fairly stable.  She is anxious a lot.  She has a lot of physical pain.  Patient drinks no alcohol uses no drugs.  Her biggest stress therefore is her son who has psychiatrically declined.  Patient was supposed to move into new housing in January but it fell through.  The patient is started on section 8 and is still looking forward to mood.  She is a very toxic relationship with her landlord.  Patient has a lot of pain related to fibromyalgia.  Patient has no evidence of psychosis.  She drinks no alcohol uses no drugs.    Patient Active Problem List   Diagnosis Date Noted   Infectious gastroenteritis [A09] 04/16/2016   Xerostomia [K11.7] 03/09/2016   Surgery, elective [Z41.9] 05/23/2015   S/P arthroscopy of shoulder [Z98.890] 05/23/2015   Chronic migraine without aura without status migrainosus, not intractable [G43.709] 10/18/2014   Tobacco abuse [Z72.0] 10/18/2014   Obesity [E66.9] 09/23/2014   COPD [J44.9] 09/02/2014   Pulmonary hypertension ([place]) [I27.20] 08/31/2014   Respiratory failure with hypoxia ([place]) [J96.91] 08/14/2014   Cigarette smoker [F17.210] 07/28/2014    Major depressive disorder, recurrent episode, moderate (HCC) [F33.1] 07/06/2014   Essential hypertension [I10]    SOB (shortness of breath) [R06.02] 06/21/2014   Precordial pain [R07.2] 06/21/2014   Gastroesophageal reflux disease [K21.9] 06/21/2014   Chest pain [R07.9] 06/21/2014   HTN (hypertension) [I10]    Neck pain [M54.2] 01/08/2014   Major depressive disorder, recurrent episode, severe, without mention of psychotic behavior [F33.2] 10/14/2012   Schizoaffective disorder ([place]) [F25.9] 05/26/2012   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] 10/06/2010   Hyperparathyroidism, primary ([place]) [E21.0] 10/06/2010   DEGENERATIVE DISC DISEASE, LUMBOSACRAL SPINE [M51.37] 05/21/2007   DERMATOPHYTOSIS OF THE BODY [B35.4] 05/10/2007   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] 03/24/2007   Anxiety state [F41.1] 03/24/2007   DENTAL PAIN [K08.9] 03/24/2007   SHOULDER PAIN, LEFT [M25.519] 03/24/2007   Total Time spent with patient:30 min  Past Psychiatric History:   Past Medical History:  Past Medical History:  Diagnosis Date   Anginal pain ([place])    admit 06/2014; had non-ischemic stress test   Anxiety    Bipolar 1 disorder (HCC)    Colon polyp    CTS (carpal tunnel syndrome)    Depression    Diabetes mellitus without complication (HCC)    Fever blister    GERD (gastroesophageal reflux disease)    HA (headache)    HTN (hypertension)    Hypercholesterolemia    Migraines    OA (osteoarthritis)    Schizo-affective psychosis ([place])     Past Surgical History:  Procedure Laterality Date   ANTERIOR CERVICAL DECOMP/DISCECTOMY FUSION  08/27/2011   Procedure: ANTERIOR CERVICAL DECOMPRESSION/DISCECTOMY FUSION 1 LEVEL/HARDWARE REMOVAL;  Surgeon: Eustace Moore, MD;  Location: [facility];  Service: Neurosurgery;  Laterality: Bilateral;  Cervical four-five Anterior cervical decompression/diskectomy, fusion, Plate, Removal of Cervical five-seven Plate   back injection      CARDIAC CATHETERIZATION N/A 11/09/2014   Procedure: Right Heart Cath;  Surgeon: Larey Dresser, MD;  Location: [facility];  Service: Cardiovascular;  Laterality: N/A;   CARPAL TUNNEL RELEASE  20110 rt/lt   rt x2 , lt x1   COLONOSCOPY  06/2017   [facility]   ESOPHAGOGASTRODUODENOSCOPY  06/2017   [facility]     MULTIPLE TOOTH EXTRACTIONS     NECK SURGERY  2009   PITUITARY SURGERY     Had gland removed from producing too much calcium   polp removed  2011   RIGHT/LEFT HEART CATH AND CORONARY ANGIOGRAPHY N/A 07/05/2017   Procedure: RIGHT/LEFT HEART CATH AND CORONARY ANGIOGRAPHY;  Surgeon: Larey Dresser, MD;  Location: [facility];  Service: Cardiovascular;  Laterality: N/A;   SHOULDER ARTHROSCOPY WITH ROTATOR CUFF REPAIR Right 05/23/2015   Procedure: RIGHT SHOULDER ARTHROSCOPY WITH REMOVAL OF SUTURE ANCHOR AND POSSIBLE REVISION ROTATOR CUFF REPAIR;  Surgeon: Justice Britain, MD;  Location: [facility];  Service: Orthopedics;  Laterality: Right;   SHOULDER ARTHROSCOPY WITH SUBACROMIAL DECOMPRESSION Right 01/24/2015   Procedure: RIGHT SHOULDER ARTHROSCOPY WITH SUBACROMIAL DECOMPRESSION AD DISTAL CLAVICLE RESECTION ;  Surgeon: Justice Britain, MD;  Location: [facility];  Service: Orthopedics;  Laterality: Right;   VAGINAL DELIVERY     x3   Family History:  Family History  Problem Relation Age of Onset   Coronary artery disease Father    Cancer Father        head neck    Esophageal cancer Father    Hypertension Mother    Schizophrenia Mother    Diabetes Mother    Heart attack Mother    Depression Brother    Suicidality Brother    Prostate cancer Brother    Cancer Brother        bone marrow   Schizophrenia Maternal Grandmother    Anesthesia problems Neg Hx    Hypotension Neg Hx    Malignant hyperthermia Neg Hx    Pseudochol deficiency Neg Hx    Allergic rhinitis Neg Hx    Angioedema Neg Hx    Asthma Neg Hx     Atopy Neg Hx    Eczema Neg Hx    Immunodeficiency Neg Hx    Urticaria Neg Hx    Breast cancer Neg Hx    Family Psychiatric  History:  Social History:  Social History   Substance and Sexual Activity  Alcohol Use No   Alcohol/week: 0.0 standard drinks     Social History   Substance and Sexual Activity  Drug Use No   Comment: hx crack addiction 2008    Social History   Socioeconomic History   Marital status: Single    Spouse name: Not on file   Number of children: 3   Years of education: 12   Highest education level: Some college, no degree  Occupational History   Occupation: disabled/retired  Tobacco Use   Smoking status: Current Some Day Smoker    Packs/day: 0.10    Years: 30.00    Pack years: 3.00    Types: Cigarettes   Smokeless  tobacco: Never Used  Vaping Use   Vaping Use: Some days  Substance and Sexual Activity   Alcohol use: No    Alcohol/week: 0.0 standard drinks   Drug use: No    Comment: hx crack addiction 2008   Sexual activity: Never  Other Topics Concern   Not on file  Social History Narrative   Lives in a two story home alone      [place] in [place] for pain management      Right handed   12th grade      Caffeine coffee 1 cup /day   Social Determinants of Health   Financial Resource Strain: Not on file  Food Insecurity: Not on file  Transportation Needs: Not on file  Physical Activity: Not on file  Stress: Not on file  Social Connections: Not on file   Additional Social History:                         Sleep: Good  Appetite:  Fair  Current Medications: Current Outpatient Medications  Medication Sig Dispense Refill   clonazePAM (KLONOPIN) 1 MG tablet 1  qhs 30 tablet 3   Levomilnacipran HCl ER (FETZIMA) 20 MG CP24 1  qam 30 capsule 3   ASHWAGANDHA PO Take 1 capsule by mouth 2 (two) times daily.     azelastine (ASTELIN) 0.1 % nasal spray Place 1 spray into both nostrils 2 (two) times daily.      cetirizine (ZYRTEC ALLERGY) 10 MG tablet Take 1 tablet (10 mg total) by mouth daily. 90 tablet 0   cholecalciferol (VITAMIN D) 1000 UNITS tablet Take 1,000 Units by mouth daily.     DEXILANT 30 MG capsule Take 1 capsule by mouth daily.     diclofenac Sodium (VOLTAREN) 1 % GEL Apply topically 4 (four) times daily.     diphenhydrAMINE (BENADRYL) 25 MG tablet 1 tablet as needed     Echinacea-Goldenseal LIQD Place 1 drop into the nose daily as needed.     Eszopiclone (ESZOPICLONE) 3 MG TABS Take 1 tablet (3 mg total) by mouth at bedtime as needed. Take immediately before bedtime 30 tablet 3   Ginger, Zingiber officinalis, (GINGER EXTRACT PO) Take by mouth.     hydrocortisone (ANUSOL-HC) 2.5 % rectal cream PLACE 1 APPLICATION IN RECTUM 3 TIMES A DAY     hydrOXYzine (VISTARIL) 25 MG capsule Take 25 mg by mouth 2 (two) times daily.     lidocaine (LIDODERM) 5 % Place 1 patch onto the skin daily. Remove & Discard patch within 12 hours or as directed by MD 30 patch 0   magic mouthwash SOLN Take 5 mLs by mouth 3 (three) times daily as needed for mouth pain. 100 mL 0   Magnesium 500 MG TABS 1 tablet     mirtazapine (REMERON) 30 MG tablet Take 1 tablet (30 mg total) by mouth at bedtime. 30 tablet 4   NON FORMULARY 2 (two) times daily. [place]     Omega-3 Fatty Acids (FISH OIL PO) Take by mouth 2 (two) times a day.     ondansetron (ZOFRAN) 8 MG tablet Take 1 tablet (8 mg total) by mouth every 8 (eight) hours as needed for nausea or vomiting. 20 tablet 3   oxyCODONE-acetaminophen (PERCOCET) 10-325 MG tablet Take 1 tablet by mouth 2 (two) times daily.     PAZEO 0.7 % SOLN PLACE 1 DROP INTO BOTH EYES ONCE DAILY as needed for allergies  3  potassium chloride SA (K-DUR,KLOR-CON) 20 MEQ tablet Take 20 mEq by mouth 2 (two) times daily.     promethazine-dextromethorphan (PROMETHAZINE-DM) 6.25-15 MG/5ML syrup 5 ml as needed     rosuvastatin (CRESTOR) 10 MG tablet 1 tablet     SUMAtriptan  (IMITREX) 20 MG/ACT nasal spray 1 spray at onset of headache in one nostril may repeat after 2 hours as needed     topiramate (TOPAMAX) 100 MG tablet Take 1 tablet (100 mg total) by mouth 2 (two) times daily. 60 tablet 6   triamterene-hydrochlorothiazide (DYAZIDE) 50-25 MG capsule Take 1 capsule by mouth daily.     TURMERIC PO Take by mouth as needed.     Ubrogepant (UBRELVY) 50 MG TABS Take one tablet onset migraine, may repeat in 2 hours if needed (max 2 tabs/24 hours) 10 tablet 5   valACYclovir (VALTREX) 1000 MG tablet Take 1,000 mg by mouth daily.     vitamin B-12 (CYANOCOBALAMIN) 1000 MCG tablet Take 1,000 mcg by mouth daily.     No current facility-administered medications for this visit.    Lab Results: No results found for this or any previous visit (from the past 48 hour(s)).  Physical Findings: AIMS:  , ,  ,  ,    CIWA:    COWS:     Musculoskeletal: Strength & Muscle Tone: within normal limits Gait & Station: normal Patient leans: N/A  Psychiatric Specialty Exam: ROS  There were no vitals taken for this visit.There is no height or weight on file to calculate BMI.  General Appearance: Casual  Eye Contact::  Good  Speech:  Clear and Coherent  Volume:  Normal  Mood:  Euthymic  Affect:  Congruent  Thought Process:  Coherent  Orientation:  Full (Time, Place, and Person)  Thought Content:  WDL  Suicidal Thoughts:  No  Homicidal Thoughts:  No  Memory:  NA  Judgement:  Good  Insight:  Fair  Psychomotor Activity:  Normal  Concentration:  Fair  Recall:  Good  Fund of Knowledge:Good  Language: Good  Akathisia:  No  Handed:  Right  AIMS (if indicated):     Assets:   ADL's:  Intact  Cognition: WNL  Sleep:       Treatment Plan  05/29/2020, 3:05 PM  Today we will make some significant changes.  Her diagnosis is major depression.  At this time the patient will continue taking Remeron 30 mg but will discontinue her nortriptyline.  We will begin her on Fetzima   20 mg for both depression and for fibromyalgia.  Her second problem is insomnia.  The patient has been taking Valium 2 mg twice daily and 5 mg at night.  At this time we will discontinue all her Valium and begin her on Klonopin 1 mg nightly.  The possibility of increasing it to 2 mg is not out of the question.  Her next visit we will talk about therapy.  The patient will be seen again in 1 to 2 months.  Is my help with Remeron or new antidepressant and Klonopin she will be better.  I think when she gets over the trauma of getting her son in a facility she will feel better.

## 2020-06-12 ENCOUNTER — Other Ambulatory Visit (HOSPITAL_COMMUNITY): Payer: Self-pay | Admitting: Psychiatry

## 2020-07-02 ENCOUNTER — Telehealth (HOSPITAL_COMMUNITY): Payer: Self-pay

## 2020-07-02 NOTE — Telephone Encounter (Signed)
Patient called and stated that she would like to go back on the Valium that helps her stay calm during the day and sleep at night. She stated that the Clonazepam doesn't work as well for her. Her son is going in and out of the hospital so she experiences a lot of anxiety due to that. She stated that she would also like the dosage on her Fetzima increased. Please review and advise. Thank you

## 2020-07-04 ENCOUNTER — Other Ambulatory Visit (HOSPITAL_COMMUNITY): Payer: Self-pay

## 2020-07-04 MED ORDER — CLONAZEPAM 1 MG PO TABS
ORAL_TABLET | ORAL | 3 refills | Status: DC
Start: 2020-07-04 — End: 2020-07-30

## 2020-07-04 NOTE — Telephone Encounter (Signed)
Spoke with provider and increased the frequency of patient's Clonazepam 1mg . Changed it to 1 tab qam & 1 tab qhs, #60 w/3 refills per provider. Patient also wanted the dosage on her Fetzima 20mg  increased; however, provider stated that he would discuss the increase at her next appointment on 4/26. Called patient to relay message but she didn't pick up so I LVM

## 2020-07-08 ENCOUNTER — Encounter
Payer: Medicaid Other | Attending: Physical Medicine and Rehabilitation | Admitting: Physical Medicine and Rehabilitation

## 2020-07-08 DIAGNOSIS — K219 Gastro-esophageal reflux disease without esophagitis: Secondary | ICD-10-CM | POA: Insufficient documentation

## 2020-07-08 DIAGNOSIS — M25511 Pain in right shoulder: Secondary | ICD-10-CM | POA: Insufficient documentation

## 2020-07-08 DIAGNOSIS — F411 Generalized anxiety disorder: Secondary | ICD-10-CM | POA: Insufficient documentation

## 2020-07-08 DIAGNOSIS — G894 Chronic pain syndrome: Secondary | ICD-10-CM | POA: Insufficient documentation

## 2020-07-08 DIAGNOSIS — G8929 Other chronic pain: Secondary | ICD-10-CM | POA: Insufficient documentation

## 2020-07-30 ENCOUNTER — Telehealth (INDEPENDENT_AMBULATORY_CARE_PROVIDER_SITE_OTHER): Payer: Medicaid Other | Admitting: Psychiatry

## 2020-07-30 ENCOUNTER — Other Ambulatory Visit: Payer: Self-pay

## 2020-07-30 DIAGNOSIS — F329 Major depressive disorder, single episode, unspecified: Secondary | ICD-10-CM

## 2020-07-30 MED ORDER — CLONAZEPAM 1 MG PO TABS: ORAL_TABLET | ORAL | 4 refills | Status: DC

## 2020-07-30 NOTE — Progress Notes (Signed)
Patient ID: Gloria Lewis, female   DOB: 1956-01-23, 65 y.o.   MRN: [id number] [facility] MD Progress Note  07/30/2020 3:27 PM Gloria Lewis  MRN:  [id number] Subjective:  Shoulder hurting Principal Problem: Major Depression,recurent Mild Diagnosis: Adjustment disorder with an anxious mood stat  Today the patient is in crisis mode.  She is overwhelmed.  She is going to try to get guardianship over her mentally ill son.  Her son at this time is in the inpatient psychiatric unit at [facility].  Their daughter recently just called the patient and similar to discharge him because he has been aggressive and sexually inappropriate.  Her son apparently been in multiple facilities in the last month.  He is not well.  He is not his baseline.  The patient is scared to death of what could happen with him.  His son has even been threatening to this patient.  The patient is actually responding fairly well to our interventions.  She still takes Remeron 30 mg and Fetzima 20 mg.  We discontinued all of her Valium and changed it to Klonopin.  She takes 1 mg twice daily and it seems to be adequate.  She is sleeping and eating.  She is not psychotic.  The patient is looking forward to coming off of her opiate pain medications in the next few months.  The patient is not drug-seeking.  Patient has contacted multiple different agencies in multiple different individuals to help her deal with her chronically mentally ill out of control son.  The patient sounds like she is doing the best she can.  The patient environment where she lives is also chaotic.  This has been this patient's experience for the last year or 2.  This patient is going to see me again in 2 months in person.  We will continue all medications as prescribed.   Patient Active Problem List   Diagnosis Date Noted  . Infectious gastroenteritis [A09] 04/16/2016  . Xerostomia [K11.7] 03/09/2016  . Surgery, elective [Z41.9] 05/23/2015  . S/P arthroscopy of  shoulder [Z98.890] 05/23/2015  . Chronic migraine without aura without status migrainosus, not intractable [G43.709] 10/18/2014  . Tobacco abuse [Z72.0] 10/18/2014  . Obesity [E66.9] 09/23/2014  . COPD [J44.9] 09/02/2014  . Pulmonary hypertension ([place]) [I27.20] 08/31/2014  . Respiratory failure with hypoxia ([place]) [J96.91] 08/14/2014  . Cigarette smoker [F17.210] 07/28/2014  . Major depressive disorder, recurrent episode, moderate ([place]) [F33.1] 07/06/2014  . Essential hypertension [I10]   . SOB (shortness of breath) [R06.02] 06/21/2014  . Precordial pain [R07.2] 06/21/2014  . Gastroesophageal reflux disease [K21.9] 06/21/2014  . Chest pain [R07.9] 06/21/2014  . HTN (hypertension) [I10]   . Neck pain [M54.2] 01/08/2014  . Major depressive disorder, recurrent episode, severe, without mention of psychotic behavior [F33.2] 10/14/2012  . Schizoaffective disorder ([place]) [F25.9] 05/26/2012  . 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 Hyperparathyroidism, primary ([place]) [E21.0] 10/06/2010  . DEGENERATIVE DISC DISEASE, LUMBOSACRAL SPINE [M51.37] 05/21/2007  . DERMATOPHYTOSIS OF THE BODY [B35.4] 05/10/2007  . 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 Anxiety state [F41.1] 03/24/2007  . DENTAL PAIN [K08.9] 03/24/2007  . SHOULDER PAIN, LEFT [M25.519] 03/24/2007   Total Time spent with patient:30 min  Past Psychiatric History:   Past Medical History:  Past Medical History:  Diagnosis Date  . Anginal pain ([place])    admit 06/2014; had non-ischemic stress test  . Anxiety   . Bipolar 1 disorder ([place])   . Colon polyp   .  CTS (carpal tunnel syndrome)   . Depression   . Diabetes mellitus without complication ([place])   . Fever blister   . GERD (gastroesophageal reflux disease)   . HA (headache)   . HTN (hypertension)   . Hypercholesterolemia   . Migraines   . OA (osteoarthritis)   . Schizo-affective psychosis ([place])     Past Surgical History:  Procedure Laterality Date  . ANTERIOR  CERVICAL DECOMP/DISCECTOMY FUSION  08/27/2011   Procedure: ANTERIOR CERVICAL DECOMPRESSION/DISCECTOMY FUSION 1 LEVEL/HARDWARE REMOVAL;  Surgeon: Eustace Moore, MD;  Location: [place] NEURO ORS;  Service: Neurosurgery;  Laterality: Bilateral;  Cervical four-five Anterior cervical decompression/diskectomy, fusion, Plate, Removal of Cervical five-seven Plate  . back injection    . CARDIAC CATHETERIZATION N/A 11/09/2014   Procedure: Right Heart Cath;  Surgeon: Larey Dresser, MD;  Location: [place] CV LAB;  Service: Cardiovascular;  Laterality: N/A;  . CARPAL TUNNEL RELEASE  20110 rt/lt   rt x2 , lt x1  . COLONOSCOPY  06/2017   [facility]  . ESOPHAGOGASTRODUODENOSCOPY  06/2017   [facility]  . HEMORRHOID SURGERY    . MULTIPLE TOOTH EXTRACTIONS    . NECK SURGERY  2009  . PITUITARY SURGERY     Had gland removed from producing too much calcium  . polp removed  2011  . RIGHT/LEFT HEART CATH AND CORONARY ANGIOGRAPHY N/A 07/05/2017   Procedure: RIGHT/LEFT HEART CATH AND CORONARY ANGIOGRAPHY;  Surgeon: Larey Dresser, MD;  Location: [place] CV LAB;  Service: Cardiovascular;  Laterality: N/A;  . SHOULDER ARTHROSCOPY WITH ROTATOR CUFF REPAIR Right 05/23/2015   Procedure: RIGHT SHOULDER ARTHROSCOPY WITH REMOVAL OF SUTURE ANCHOR AND POSSIBLE REVISION ROTATOR CUFF REPAIR;  Surgeon: Justice Britain, MD;  Location: [place];  Service: Orthopedics;  Laterality: Right;  . SHOULDER ARTHROSCOPY WITH SUBACROMIAL DECOMPRESSION Right 01/24/2015   Procedure: RIGHT SHOULDER ARTHROSCOPY WITH SUBACROMIAL DECOMPRESSION AD DISTAL CLAVICLE RESECTION ;  Surgeon: Justice Britain, MD;  Location: [place];  Service: Orthopedics;  Laterality: Right;  Marland Kitchen VAGINAL DELIVERY     x3   Family History:  Family History  Problem Relation Age of Onset  . Coronary artery disease Father   . Cancer Father        head neck   . Esophageal cancer Father   . Hypertension Mother   . Schizophrenia Mother   . Diabetes Mother    . Heart attack Mother   . Depression Brother   . Suicidality Brother   . Prostate cancer Brother   . Cancer Brother        bone marrow  . Schizophrenia Maternal Grandmother   . Anesthesia problems Neg Hx   . Hypotension Neg Hx   . Malignant hyperthermia Neg Hx   . Pseudochol deficiency Neg Hx   . Allergic rhinitis Neg Hx   . Angioedema Neg Hx   . Asthma Neg Hx   . Atopy Neg Hx   . Eczema Neg Hx   . Immunodeficiency Neg Hx   . Urticaria Neg Hx   . Breast cancer Neg Hx    Family Psychiatric  History:  Social History:  Social History   Substance and Sexual Activity  Alcohol Use No  . Alcohol/week: 0.0 standard drinks     Social History   Substance and Sexual Activity  Drug Use No   Comment: hx crack addiction 2008    Social History   Socioeconomic History  . Marital status: Single    Spouse name: Not  on file  . Number of children: 3  . Years of education: 4  . Highest education level: Some college, no degree  Occupational History  . Occupation: disabled/retired  Tobacco Use  . Smoking status: Current Some Day Smoker    Packs/day: 0.10    Years: 30.00    Pack years: 3.00    Types: Cigarettes  . Smokeless tobacco: Never Used  Vaping Use  . Vaping Use: Some days  Substance and Sexual Activity  . Alcohol use: No    Alcohol/week: 0.0 standard drinks  . Drug use: No    Comment: hx crack addiction 2008  . Sexual activity: Never  Other Topics Concern  . Not on file  Social History Narrative   Lives in a two story home alone      [facility] [place] [facility]      Right handed   12th grade      Caffeine coffee 1 cup /day   Social Determinants of Health   Financial Resource Strain: Not on file  Food Insecurity: Not on file  Transportation Needs: Not on file  Physical Activity: Not on file  Stress: Not on file  Social Connections: Not on file   Additional Social History:                         Sleep: Good  Appetite:   Fair  Current Medications: Current Outpatient Medications  Medication Sig Dispense Refill  . ASHWAGANDHA PO Take 1 capsule by mouth 2 (two) times daily.    Marland Kitchen azelastine (ASTELIN) 0.1 % nasal spray Place 1 spray into both nostrils 2 (two) times daily.    . cetirizine (ZYRTEC ALLERGY) 10 MG tablet Take 1 tablet (10 mg total) by mouth daily. 90 tablet 0  . cholecalciferol (VITAMIN D) 1000 UNITS tablet Take 1,000 Units by mouth daily.    . clonazePAM (KLONOPIN) 1 MG tablet Take 1 tablet qam & 1 tablet qhs 60 tablet 4  . DEXILANT 30 MG capsule Take 1 capsule by mouth daily.    . diclofenac Sodium (VOLTAREN) 1 % GEL Apply topically 4 (four) times daily.    . diphenhydrAMINE (BENADRYL) 25 MG tablet 1 tablet as needed    . Echinacea-Goldenseal LIQD Place 1 drop into the nose daily as needed.    . Eszopiclone (ESZOPICLONE) 3 MG TABS Take 1 tablet (3 mg total) by mouth at bedtime as needed. Take immediately before bedtime 30 tablet 3  . Ginger, Zingiber officinalis, (GINGER EXTRACT PO) Take by mouth.    . hydrocortisone (ANUSOL-HC) 2.5 % rectal cream PLACE 1 APPLICATION IN RECTUM 3 TIMES A DAY    . hydrOXYzine (VISTARIL) 25 MG capsule Take 25 mg by mouth 2 (two) times daily.    . Levomilnacipran HCl ER (FETZIMA) 20 MG CP24 1  qam 30 capsule 3  . lidocaine (LIDODERM) 5 % Place 1 patch onto the skin daily. Remove & Discard patch within 12 hours or as directed by MD 30 patch 0  . magic mouthwash SOLN Take 5 mLs by mouth 3 (three) times daily as needed for mouth pain. 100 mL 0  . Magnesium 500 MG TABS 1 tablet    . mirtazapine (REMERON) 30 MG tablet Take 1 tablet (30 mg total) by mouth at bedtime. 30 tablet 4  . NON FORMULARY 2 (two) times daily. [place]    . Omega-3 Fatty Acids (FISH OIL PO) Take by mouth 2 (two) times  a day.    . ondansetron (ZOFRAN) 8 MG tablet Take 1 tablet (8 mg total) by mouth every 8 (eight) hours as needed for nausea or vomiting. 20 tablet 3  . oxyCODONE-acetaminophen (PERCOCET)  10-325 MG tablet Take 1 tablet by mouth 2 (two) times daily.    Marland Kitchen PAZEO 0.7 % SOLN PLACE 1 DROP INTO BOTH EYES ONCE DAILY as needed for allergies  3  . potassium chloride SA (K-DUR,KLOR-CON) 20 MEQ tablet Take 20 mEq by mouth 2 (two) times daily.    . promethazine-dextromethorphan (PROMETHAZINE-DM) 6.25-15 MG/5ML syrup 5 ml as needed    . rosuvastatin (CRESTOR) 10 MG tablet 1 tablet    . SUMAtriptan (IMITREX) 20 MG/ACT nasal spray 1 spray at onset of headache in one nostril may repeat after 2 hours as needed    . topiramate (TOPAMAX) 100 MG tablet Take 1 tablet (100 mg total) by mouth 2 (two) times daily. 60 tablet 6  . triamterene-hydrochlorothiazide (DYAZIDE) 50-25 MG capsule Take 1 capsule by mouth daily.    . TURMERIC PO Take by mouth as needed.    Marland Kitchen Ubrogepant (UBRELVY) 50 MG TABS Take one tablet onset migraine, may repeat in 2 hours if needed (max 2 tabs/24 hours) 10 tablet 5  . valACYclovir (VALTREX) 1000 MG tablet Take 1,000 mg by mouth daily.    . vitamin B-12 (CYANOCOBALAMIN) 1000 MCG tablet Take 1,000 mcg by mouth daily.     No current facility-administered medications for this visit.    Lab Results: No results found for this or any previous visit (from the past 48 hour(s)).  Physical Findings: AIMS:  , ,  ,  ,    CIWA:    COWS:     Musculoskeletal: Strength & Muscle Tone: within normal limits Gait & Station: normal Patient leans: N/A  Psychiatric Specialty Exam: ROS  There were no vitals taken for this visit.There is no height or weight on file to calculate BMI.  General Appearance: Casual  Eye Contact::  Good  Speech:  Clear and Coherent  Volume:  Normal  Mood:  Euthymic  Affect:  Congruent  Thought Process:  Coherent  Orientation:  Full (Time, Place, and Person)  Thought Content:  WDL  Suicidal Thoughts:  No  Homicidal Thoughts:  No  Memory:  NA  Judgement:  Good  Insight:  Fair  Psychomotor Activity:  Normal  Concentration:  Fair  Recall:  Good  Fund of  Knowledge:Good  Language: Good  Akathisia:  No  Handed:  Right  AIMS (if indicated):     Assets:   ADL's:  Intact  Cognition: WNL  Sleep:       Treatment Plan  07/30/2020, 3:27 PM  At this time the patient's diagnosis is that of major depression.  She will continue taking Fetzima 20 mg Remeron 30 mg.  Her second problem is insomnia.  She will continue taking Klonopin 1 mg twice daily.  Her third problem is an adjustment disorder with an anxious mood state.  She will continue having multiple supports that she has therapist who help her.  She is involved with [facility].  This patient will return to see me in approximately 1 or 2 months.  She is actually surviving.  She is functioning actually quite well.  The Klonopin seems to be working well even though she says she is overwhelmed she interestingly does not ask for more of it.  I think she knows the wrong way to go with using Klonopin and she  is avoiding it.  We will keep it at a fixed dose of 1 mg twice daily.

## 2020-08-01 ENCOUNTER — Telehealth (HOSPITAL_COMMUNITY): Payer: Self-pay | Admitting: *Deleted

## 2020-08-01 NOTE — Telephone Encounter (Signed)
Patient called regarding prior authorization for Klonopin. Patient stated she needs it by tomorrow.  Coworker Pam is checking into the authorization.

## 2020-08-13 NOTE — Progress Notes (Deleted)
No chief complaint on file.    HISTORY OF PRESENT ILLNESS: 08/13/20 ALL: Gloria Lewis returns for follow up for migraines. We continued topiramate 100mg  BID and started Ubrelvy for abortive therapy at last visit in 02/2020. No longer taking nortriptyline previously prescribed by psychiatry. She continues Remeron and was switched to clonazepam (previously diazepam).    02/14/2020 ALL:  Gloria Lewis is a 65 y.o. female here today for follow up for migraine with aura. She was restarted on topiramate 50mg  BID and Nurtec PRN. Dose was increased to 100mg  tiwice daily. She has continued nortriptyline 50mg  at bedtime prescribed by psychiatry. She is also taking Valium 5mg  BID and Remeron 30mg  daily for mood management. She feels that headaches wax and wane. She reports having 22 headache days a month. She has tried Nurtec about 6 times and felt it worsened Acid Reflux. It was effective in abortion therapy. She is taking Dexilant 30mg  daily for GERD.    MRI showed artery tortuosity most likely from HTN and small vessel disease. On Dyazide and fish oil, no statin. She has talked to PCP about having MRA/CTA but has decided to wait at this time. She has been working on diet changes over the past 3 years. She has lost about 60 pounds. She reports huge amounts of stress. She lives in an apartment complex. She is having a hard time with her landlord. She is not sleeping well. She tries to sleep when her neighbors are awake to avoid conflicts. She does not have any other place she can live. [place] is [place] [address]. She feels that this contributes to her headaches. She is seeing psychiatrist every 3 months. She is walking at least 5 days a week. She does not snore. No daytime sleepiness.    HISTORY (copied from Dr Gladstone Lighter note on 08/14/2019)  65 year old female here for evaluation of headaches. Patient has had headaches since childhood with migraine features, throbbing severe headaches with nausea and sensitivity  to light. Sometimes she sees visual disturbance before her headache starts like "raining" pattern. Patient averages three headaches per week. Triggers include stress, allergies and pain. She reports family history of brain aneurysm. Patient had several falls and injuries when she was younger where she hit her head on the ground.  Currently patient is struggling with right rotator cuff pain on pain medication. She does exercise five times per week. She drinks 1 cup of tea per day. Patient has remote history of substance abuse more than 25 years ago, currently abstinent. She does smoke cigarettes and is trying to quit. No alcohol use.  Patient is tried some migraine medications including topiramate, Imitrex, Maxalt, Ajovy, muscle relaxers, narcotics, benzodiazepines, trigger point injections without relief. In the past Topamax seem to work the best without any side effects.    REVIEW OF SYSTEMS: Out of a complete 14 system review of symptoms, the patient complains only of the following symptoms, headaches, anxiety, chronic pain and all other reviewed systems are negative.   ALLERGIES: Allergies  Allergen Reactions  . Effexor [Venlafaxine Hydrochloride] Itching and Other (See Comments)    headache  . Latex Itching and Rash    Other reaction(s): Unknown  . Penicillins Hives    Has patient had a PCN reaction causing immediate rash, facial/tongue/throat swelling, SOB or lightheadedness with hypotension: Yes Has patient had a PCN reaction causing severe rash involving mucus membranes or skin necrosis: No Has patient had a PCN reaction that required hospitalization No Has patient had a PCN  reaction occurring within the last 10 years: No If all of the above answers are "NO", then may proceed with Cephalosporin use.   . Zithromax [Azithromycin Dihydrate] Swelling  . Amlodipine Swelling  . Atorvastatin Swelling    Muscle aches  . Butrans [Buprenorphine] Other (See Comments)    Ulcers-"mouth  would not heal"  . Codeine Itching    Tolerable with benadryl  . Nucynta [Tapentadol] Other (See Comments)    Ulcers inside of mouth  . Nurtec [Rimegepant Sulfate] Nausea Only  . Other     Other reaction(s): nausea Other reaction(s): itching Other reaction(s): itch Other reaction(s): HA Other reaction(s): Unknown  . Pravastatin Sodium     Other reaction(s): itching  . Propranolol Hcl     Other reaction(s): dyspnea  . Sumatriptan     Other reaction(s): Unknown  . Venlafaxine     Other reaction(s): HA  . Vicodin [Hydrocodone-Acetaminophen] Itching  . Amoxicillin Itching    Other reaction(s): itching  . Chantix [Varenicline Tartrate] Nausea Only  . Hydrocodone Itching    Tolerable with benadryl Other reaction(s): itch  . Paroxetine Hcl Other (See Comments)    headache  . Tramadol Other (See Comments)    Pt states it interacted with her sertraline, but she is no longer on sertraline.  She does not remember the type of reaction she had.  Other reaction(s): Fainted     HOME MEDICATIONS: Outpatient Medications Prior to Visit  Medication Sig Dispense Refill  . ASHWAGANDHA PO Take 1 capsule by mouth 2 (two) times daily.    Marland Kitchen azelastine (ASTELIN) 0.1 % nasal spray Place 1 spray into both nostrils 2 (two) times daily.    . cetirizine (ZYRTEC ALLERGY) 10 MG tablet Take 1 tablet (10 mg total) by mouth daily. 90 tablet 0  . cholecalciferol (VITAMIN D) 1000 UNITS tablet Take 1,000 Units by mouth daily.    . clonazePAM (KLONOPIN) 1 MG tablet Take 1 tablet qam & 1 tablet qhs 60 tablet 4  . DEXILANT 30 MG capsule Take 1 capsule by mouth daily.    . diclofenac Sodium (VOLTAREN) 1 % GEL Apply topically 4 (four) times daily.    . diphenhydrAMINE (BENADRYL) 25 MG tablet 1 tablet as needed    . Echinacea-Goldenseal LIQD Place 1 drop into the nose daily as needed.    . Eszopiclone (ESZOPICLONE) 3 MG TABS Take 1 tablet (3 mg total) by mouth at bedtime as needed. Take immediately before bedtime  30 tablet 3  . Ginger, Zingiber officinalis, (GINGER EXTRACT PO) Take by mouth.    . hydrocortisone (ANUSOL-HC) 2.5 % rectal cream PLACE 1 APPLICATION IN RECTUM 3 TIMES A DAY    . hydrOXYzine (VISTARIL) 25 MG capsule Take 25 mg by mouth 2 (two) times daily.    . Levomilnacipran HCl ER (FETZIMA) 20 MG CP24 1  qam 30 capsule 3  . lidocaine (LIDODERM) 5 % Place 1 patch onto the skin daily. Remove & Discard patch within 12 hours or as directed by MD 30 patch 0  . magic mouthwash SOLN Take 5 mLs by mouth 3 (three) times daily as needed for mouth pain. 100 mL 0  . Magnesium 500 MG TABS 1 tablet    . mirtazapine (REMERON) 30 MG tablet Take 1 tablet (30 mg total) by mouth at bedtime. 30 tablet 4  . NON FORMULARY 2 (two) times daily. [place]    . Omega-3 Fatty Acids (FISH OIL PO) Take by mouth 2 (two) times a day.    Marland Kitchen  ondansetron (ZOFRAN) 8 MG tablet Take 1 tablet (8 mg total) by mouth every 8 (eight) hours as needed for nausea or vomiting. 20 tablet 3  . oxyCODONE-acetaminophen (PERCOCET) 10-325 MG tablet Take 1 tablet by mouth 2 (two) times daily.    Marland Kitchen PAZEO 0.7 % SOLN PLACE 1 DROP INTO BOTH EYES ONCE DAILY as needed for allergies  3  . potassium chloride SA (K-DUR,KLOR-CON) 20 MEQ tablet Take 20 mEq by mouth 2 (two) times daily.    . promethazine-dextromethorphan (PROMETHAZINE-DM) 6.25-15 MG/5ML syrup 5 ml as needed    . rosuvastatin (CRESTOR) 10 MG tablet 1 tablet    . SUMAtriptan (IMITREX) 20 MG/ACT nasal spray 1 spray at onset of headache in one nostril may repeat after 2 hours as needed    . topiramate (TOPAMAX) 100 MG tablet Take 1 tablet (100 mg total) by mouth 2 (two) times daily. 60 tablet 6  . triamterene-hydrochlorothiazide (DYAZIDE) 50-25 MG capsule Take 1 capsule by mouth daily.    . TURMERIC PO Take by mouth as needed.    Marland Kitchen Ubrogepant (UBRELVY) 50 MG TABS Take one tablet onset migraine, may repeat in 2 hours if needed (max 2 tabs/24 hours) 10 tablet 5  . valACYclovir (VALTREX) 1000 MG  tablet Take 1,000 mg by mouth daily.    . vitamin B-12 (CYANOCOBALAMIN) 1000 MCG tablet Take 1,000 mcg by mouth daily.     No facility-administered medications prior to visit.     PAST MEDICAL HISTORY: Past Medical History:  Diagnosis Date  . Anginal pain ([place])    admit 06/2014; had non-ischemic stress test  . Anxiety   . Bipolar 1 disorder ([place])   . Colon polyp   . CTS (carpal tunnel syndrome)   . Depression   . Diabetes mellitus without complication ([place])   . Fever blister   . GERD (gastroesophageal reflux disease)   . HA (headache)   . HTN (hypertension)   . Hypercholesterolemia   . Migraines   . OA (osteoarthritis)   . Schizo-affective psychosis ([place])      PAST SURGICAL HISTORY: Past Surgical History:  Procedure Laterality Date  . ANTERIOR CERVICAL DECOMP/DISCECTOMY FUSION  08/27/2011   Procedure: ANTERIOR CERVICAL DECOMPRESSION/DISCECTOMY FUSION 1 LEVEL/HARDWARE REMOVAL;  Surgeon: Eustace Moore, MD;  Location: [facility];  Service: Neurosurgery;  Laterality: Bilateral;  Cervical four-five Anterior cervical decompression/diskectomy, fusion, Plate, Removal of Cervical five-seven Plate  . back injection    . CARDIAC CATHETERIZATION N/A 11/09/2014   Procedure: Right Heart Cath;  Surgeon: Larey Dresser, MD;  Location: [facility];  Service: Cardiovascular;  Laterality: N/A;  . CARPAL TUNNEL RELEASE  20110 rt/lt   rt x2 , lt x1  . COLONOSCOPY  06/2017   [facility]  . ESOPHAGOGASTRODUODENOSCOPY  06/2017   [facility]  . HEMORRHOID SURGERY    . MULTIPLE TOOTH EXTRACTIONS    . NECK SURGERY  2009  . PITUITARY SURGERY     Had gland removed from producing too much calcium  . polp removed  2011  . RIGHT/LEFT HEART CATH AND CORONARY ANGIOGRAPHY N/A 07/05/2017   Procedure: RIGHT/LEFT HEART CATH AND CORONARY ANGIOGRAPHY;  Surgeon: Larey Dresser, MD;  Location: [facility];  Service: Cardiovascular;  Laterality: N/A;  . SHOULDER ARTHROSCOPY  WITH ROTATOR CUFF REPAIR Right 05/23/2015   Procedure: RIGHT SHOULDER ARTHROSCOPY WITH REMOVAL OF SUTURE ANCHOR AND POSSIBLE REVISION ROTATOR CUFF REPAIR;  Surgeon: Justice Britain, MD;  Location: [facility];  Service: Orthopedics;  Laterality: Right;  . SHOULDER ARTHROSCOPY WITH SUBACROMIAL DECOMPRESSION Right 01/24/2015   Procedure: RIGHT SHOULDER ARTHROSCOPY WITH SUBACROMIAL DECOMPRESSION AD DISTAL CLAVICLE RESECTION ;  Surgeon: Justice Britain, MD;  Location: [facility];  Service: Orthopedics;  Laterality: Right;  Marland Kitchen VAGINAL DELIVERY     x3     FAMILY HISTORY: Family History  Problem Relation Age of Onset  . Coronary artery disease Father   . Cancer Father        head neck   . Esophageal cancer Father   . Hypertension Mother   . Schizophrenia Mother   . Diabetes Mother   . Heart attack Mother   . Depression Brother   . Suicidality Brother   . Prostate cancer Brother   . Cancer Brother        bone marrow  . Schizophrenia Maternal Grandmother   . Anesthesia problems Neg Hx   . Hypotension Neg Hx   . Malignant hyperthermia Neg Hx   . Pseudochol deficiency Neg Hx   . Allergic rhinitis Neg Hx   . Angioedema Neg Hx   . Asthma Neg Hx   . Atopy Neg Hx   . Eczema Neg Hx   . Immunodeficiency Neg Hx   . Urticaria Neg Hx   . Breast cancer Neg Hx      SOCIAL HISTORY: Social History   Socioeconomic History  . Marital status: Single    Spouse name: Not on file  . Number of children: 3  . Years of education: 24  . Highest education level: Some college, no degree  Occupational History  . Occupation: disabled/retired  Tobacco Use  . Smoking status: Current Some Day Smoker    Packs/day: 0.10    Years: 30.00    Pack years: 3.00    Types: Cigarettes  . Smokeless tobacco: Never Used  Vaping Use  . Vaping Use: Some days  Substance and Sexual Activity  . Alcohol use: No    Alcohol/week: 0.0 standard drinks  . Drug use: No    Comment: hx crack addiction 2008  . Sexual activity: Never   Other Topics Concern  . Not on file  Social History Narrative   Lives in a two story home alone      [employer] in [place] for pain management      Right handed   12th grade      Caffeine coffee 1 cup /day   Social Determinants of Health   Financial Resource Strain: Not on file  Food Insecurity: Not on file  Transportation Needs: Not on file  Physical Activity: Not on file  Stress: Not on file  Social Connections: Not on file  Intimate Partner Violence: Not on file      PHYSICAL EXAM  There were no vitals filed for this visit. There is no height or weight on file to calculate BMI.   Generalized: Well developed, in no acute distress   Cardiology: Normal rate and rhythm, no murmur auscultated Respiratory: Clear to auscultation bilaterally  Neurological examination  Mentation: Alert oriented to time, place, history taking. Follows all commands speech and language fluent Cranial nerve II-XII: Pupils were equal round reactive to light. Extraocular movements were full, visual field were full  Motor: The motor testing reveals 5 over 5 strength of all 4 extremities. Good symmetric motor tone is noted throughout.  Gait and station: Gait is normal.     DIAGNOSTIC DATA (LABS, IMAGING, TESTING) - I reviewed patient records, labs, notes, testing  and imaging myself where available.  Lab Results  Component Value Date   WBC 7.0 09/15/2019   HGB 14.1 09/15/2019   HCT 42.6 09/15/2019   MCV 86.2 09/15/2019   PLT 381 09/15/2019      Component Value Date/Time   NA 139 09/15/2019 1256   K 3.7 09/15/2019 1256   CL 101 09/15/2019 1256   CO2 27 09/15/2019 1256   GLUCOSE 97 09/15/2019 1256   BUN 19 09/15/2019 1256   CREATININE 0.97 09/15/2019 1256   CALCIUM 9.6 09/15/2019 1256   PROT 6.8 11/06/2017 1147   ALBUMIN 3.7 11/06/2017 1147   AST 32 11/06/2017 1147   ALT 20 11/06/2017 1147   ALKPHOS 104 11/06/2017 1147   BILITOT 0.7 11/06/2017 1147   GFRNONAA >60 09/15/2019  1256   GFRAA >60 09/15/2019 1256   Lab Results  Component Value Date   CHOL 176 08/11/2017   HDL 43 08/11/2017   LDLCALC 95 08/11/2017   TRIG 189 (H) 08/11/2017   CHOLHDL 4.1 08/11/2017   Lab Results  Component Value Date   HGBA1C 6.2 (H) 08/11/2017   Lab Results  Component Value Date   VITAMINB12 473 08/11/2017   Lab Results  Component Value Date   TSH 1.015 08/11/2017      ASSESSMENT AND PLAN  65 y.o. year old female  has a past medical history of Anginal pain ([place]), Anxiety, Bipolar 1 disorder ([place]), Colon polyp, CTS (carpal tunnel syndrome), Depression, Diabetes mellitus without complication ([place]), Fever blister, GERD (gastroesophageal reflux disease), HA (headache), HTN (hypertension), Hypercholesterolemia, Migraines, OA (osteoarthritis), and Schizo-affective psychosis ([place]). here with   Tortuosity of artery ([facility])  Migraine with aura and without status migrainosus, not intractable  Gloria Lewis continues to have regular headache days but reports improvement on topiramate 100 mg twice daily.  We will continue current treatment plan.  I will have her try Roselyn Meier for abortive therapy.  She was educated on appropriate administration and possible side effects of this medication.  She will continue to follow-up closely with primary care.  Consider imaging as indicated per PCP. No obvious concerns of aneurysm on last MRI or given in history. Healthy lifestyle habits encouraged.  Continue close follow-up with psychiatry.  Stress management discussed.  She will follow up with [place] in 6 months.    Debbora Presto, MSN, FNP-C 08/13/2020, 8:25 AM  [facility] [address], [place], [place] [address] [phone]

## 2020-08-13 NOTE — Patient Instructions (Incomplete)
Below is our plan:  We will ***  Please make sure you are staying well hydrated. I recommend 50-60 ounces daily. Well balanced diet and regular exercise encouraged. Consistent sleep schedule with 6-8 hours recommended.   Please continue follow up with care team as directed.   Follow up with *** in ***  You may receive a survey regarding today's visit. I encourage you to leave honest feed back as I do use this information to improve patient care. Thank you for seeing me today!      Migraine Headache A migraine headache is a very strong throbbing pain on one side or both sides of your head. This type of headache can also cause other symptoms. It can last from 4 hours to 3 days. Talk with your doctor about what things may bring on (trigger) this condition. What are the causes? The exact cause of this condition is not known. This condition may be triggered or caused by:  Drinking alcohol.  Smoking.  Taking medicines, such as: ? Medicine used to treat chest pain (nitroglycerin). ? Birth control pills. ? Estrogen. ? Some blood pressure medicines.  Eating or drinking certain products.  Doing physical activity. Other things that may trigger a migraine headache include:  Having a menstrual period.  Pregnancy.  Hunger.  Stress.  Not getting enough sleep or getting too much sleep.  Weather changes.  Tiredness (fatigue). What increases the risk?  Being 25-55 years old.  Being female.  Having a family history of migraine headaches.  Being Caucasian.  Having depression or anxiety.  Being very overweight. What are the signs or symptoms?  A throbbing pain. This pain may: ? Happen in any area of the head, such as on one side or both sides. ? Make it hard to do daily activities. ? Get worse with physical activity. ? Get worse around bright lights or loud noises.  Other symptoms may include: ? Feeling sick to your stomach  (nauseous). ? Vomiting. ? Dizziness. ? Being sensitive to bright lights, loud noises, or smells.  Before you get a migraine headache, you may get warning signs (an aura). An aura may include: ? Seeing flashing lights or having blind spots. ? Seeing bright spots, halos, or zigzag lines. ? Having tunnel vision or blurred vision. ? Having numbness or a tingling feeling. ? Having trouble talking. ? Having weak muscles.  Some people have symptoms after a migraine headache (postdromal phase), such as: ? Tiredness. ? Trouble thinking (concentrating). How is this treated?  Taking medicines that: ? Relieve pain. ? Relieve the feeling of being sick to your stomach. ? Prevent migraine headaches.  Treatment may also include: ? Having acupuncture. ? Avoiding foods that bring on migraine headaches. ? Learning ways to control your body functions (biofeedback). ? Therapy to help you know and deal with negative thoughts (cognitive behavioral therapy). Follow these instructions at home: Medicines  Take over-the-counter and prescription medicines only as told by your doctor.  Ask your doctor if the medicine prescribed to you: ? Requires you to avoid driving or using heavy machinery. ? Can cause trouble pooping (constipation). You may need to take these steps to prevent or treat trouble pooping:  Drink enough fluid to keep your pee (urine) pale yellow.  Take over-the-counter or prescription medicines.  Eat foods that are high in fiber. These include beans, whole grains, and fresh fruits and vegetables.  Limit foods that are high in fat and sugar. These include fried or sweet foods. Lifestyle    Do not drink alcohol.  Do not use any products that contain nicotine or tobacco, such as cigarettes, e-cigarettes, and chewing tobacco. If you need help quitting, ask your doctor.  Get at least 8 hours of sleep every night.  Limit and deal with stress. General instructions  Keep a journal to  find out what may bring on your migraine headaches. For example, write down: ? What you eat and drink. ? How much sleep you get. ? Any change in what you eat or drink. ? Any change in your medicines.  If you have a migraine headache: ? Avoid things that make your symptoms worse, such as bright lights. ? It may help to lie down in a dark, quiet room. ? Do not drive or use heavy machinery. ? Ask your doctor what activities are safe for you.  Keep all follow-up visits as told by your doctor. This is important.      Contact a doctor if:  You get a migraine headache that is different or worse than others you have had.  You have more than 15 headache days in one month. Get help right away if:  Your migraine headache gets very bad.  Your migraine headache lasts longer than 72 hours.  You have a fever.  You have a stiff neck.  You have trouble seeing.  Your muscles feel weak or like you cannot control them.  You start to lose your balance a lot.  You start to have trouble walking.  You pass out (faint).  You have a seizure. Summary  A migraine headache is a very strong throbbing pain on one side or both sides of your head. These headaches can also cause other symptoms.  This condition may be treated with medicines and changes to your lifestyle.  Keep a journal to find out what may bring on your migraine headaches.  Contact a doctor if you get a migraine headache that is different or worse than others you have had.  Contact your doctor if you have more than 15 headache days in a month. This information is not intended to replace advice given to you by your health care provider. Make sure you discuss any questions you have with your health care provider. Document Revised: 07/15/2018 Document Reviewed: 05/05/2018 Elsevier Patient Education  2021 [employer]  

## 2020-08-14 ENCOUNTER — Ambulatory Visit: Payer: Medicaid Other | Admitting: Family Medicine

## 2020-08-14 ENCOUNTER — Telehealth: Payer: Self-pay | Admitting: Family Medicine

## 2020-08-14 DIAGNOSIS — G43109 Migraine with aura, not intractable, without status migrainosus: Secondary | ICD-10-CM

## 2020-08-14 DIAGNOSIS — I771 Stricture of artery: Secondary | ICD-10-CM

## 2020-08-14 NOTE — Telephone Encounter (Signed)
I called pt and she is on her way to [facility].  She is having bad migraines, due to stress she feels. She will call back to reschedule.  She appreciated call back.

## 2020-08-14 NOTE — Telephone Encounter (Signed)
Pt called, have to cancel appt, have a family emergency.

## 2020-10-01 ENCOUNTER — Ambulatory Visit (HOSPITAL_COMMUNITY): Payer: Medicaid Other | Admitting: Psychiatry

## 2020-10-09 ENCOUNTER — Ambulatory Visit (INDEPENDENT_AMBULATORY_CARE_PROVIDER_SITE_OTHER): Payer: Medicaid Other | Admitting: Psychiatry

## 2020-10-09 ENCOUNTER — Other Ambulatory Visit: Payer: Self-pay

## 2020-10-09 DIAGNOSIS — F324 Major depressive disorder, single episode, in partial remission: Secondary | ICD-10-CM

## 2020-10-09 MED ORDER — CLONAZEPAM 1 MG PO TABS: ORAL_TABLET | ORAL | 4 refills | Status: DC

## 2020-10-09 MED ORDER — FETZIMA 20 MG PO CP24: ORAL_CAPSULE | ORAL | 3 refills | Status: DC

## 2020-10-09 MED ORDER — MIRTAZAPINE 30 MG PO TABS: 30.0000 mg | ORAL_TABLET | Freq: Every day | ORAL | 4 refills | Status: DC

## 2020-10-09 MED ORDER — ESZOPICLONE 3 MG PO TABS: 3.0000 mg | ORAL_TABLET | Freq: Every evening | ORAL | 3 refills | Status: DC | PRN

## 2020-10-09 NOTE — Progress Notes (Signed)
Patient ID: Gloria Lewis, female   DOB: [birth year], 65 y.o.   MRN: [id number] [facility] MD Progress Note  10/09/2020 2:30 PM [name]  MRN:  [id number] Subjective:  Shoulder hurting Principal Problem: Major Depression,recurent Mild Diagnosis: Adjustment disorder with an anxious mood stat  Today the patient actually seems calmer.  It is less of a crisis.  Her son is incarcerated.  The plans are although they may not go is that [facility] is [facility].  He apparently is not very stable where he is at.  Patient is always worried about him.  The biggest thing that bothers him.  The issues of where she lives is no longer a big issue.  She learned how to tolerate things.  Her lifestyle goes like this.  She gets into bed at 7 PM and sits there until about 9 or 10 thinking about the future of what plan she should make.  She goes to sleep at 10:00 and sleeps about 5 hours until 3 in the morning.  Then she gets up does all her housework.  She is relatively active.  By noon she goes to the gym and goes swimming likes to cook.  She is active.  She really does not show any evidence of persistent daily depression or anhedonia.  Her sleep is stable and she is eating well.  She has no evidence of psychosis.  She clearly denies use of alcohol or drugs.  She has plans to see a therapist named Stanton Kidney through the [facility].  In the next month she will be ending her use of opiates from the pain clinic.  She continues taking all the medication I have been prescribed.  In general the patient seems calmer today.  I think this a realization is not much more she can do about her son [name].  Commitment realities  Patient Active Problem List   Diagnosis Date Noted   Infectious gastroenteritis [A09] 04/16/2016   Xerostomia [K11.7] 03/09/2016   Surgery, elective [Z41.9] 05/23/2015   S/P arthroscopy of shoulder [Z98.890] 05/23/2015   Chronic migraine without aura without status migrainosus, not  intractable [G43.709] 10/18/2014   Tobacco abuse [Z72.0] 10/18/2014   Obesity [E66.9] 09/23/2014   COPD [J44.9] 09/02/2014   Pulmonary hypertension ([place]) [I27.20] 08/31/2014   Respiratory failure with hypoxia ([place]) [J96.91] 08/14/2014   Cigarette smoker [F17.210] 07/28/2014   Major depressive disorder, recurrent episode, moderate (HCC) [F33.1] 07/06/2014   Essential hypertension [I10]    SOB (shortness of breath) [R06.02] 06/21/2014   Precordial pain [R07.2] 06/21/2014   Gastroesophageal reflux disease [K21.9] 06/21/2014   Chest pain [R07.9] 06/21/2014   HTN (hypertension) [I10]    Neck pain [M54.2] 01/08/2014   Major depressive disorder, recurrent episode, severe, without mention of psychotic behavior [F33.2] 10/14/2012   Schizoaffective disorder ([place]) [F25.9] 05/26/2012   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] 10/06/2010   Hyperparathyroidism, primary ([place]) [E21.0] 10/06/2010   DEGENERATIVE DISC DISEASE, LUMBOSACRAL SPINE [M51.37] 05/21/2007   DERMATOPHYTOSIS OF THE BODY [B35.4] 05/10/2007   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] 03/24/2007   Anxiety state [F41.1] 03/24/2007   DENTAL PAIN [K08.9] 03/24/2007   SHOULDER PAIN, LEFT [M25.519] 03/24/2007   Total Time spent with patient:30 min  Past Psychiatric History:   Past Medical History:  Past Medical History:  Diagnosis Date   Anginal pain ([place])    admit 06/2014; had non-ischemic stress test   Anxiety    Bipolar 1 disorder (HCC)    Colon polyp  CTS (carpal tunnel syndrome)    Depression    Diabetes mellitus without complication (HCC)    Fever blister    GERD (gastroesophageal reflux disease)    HA (headache)    HTN (hypertension)    Hypercholesterolemia    Migraines    OA (osteoarthritis)    Schizo-affective psychosis ([place])     Past Surgical History:  Procedure Laterality Date   ANTERIOR CERVICAL DECOMP/DISCECTOMY FUSION  08/27/2011   Procedure: ANTERIOR CERVICAL DECOMPRESSION/DISCECTOMY FUSION 1 LEVEL/HARDWARE REMOVAL;   Surgeon: Eustace Moore, MD;  Location: [facility];  Service: Neurosurgery;  Laterality: Bilateral;  Cervical four-five Anterior cervical decompression/diskectomy, fusion, Plate, Removal of Cervical five-seven Plate   back injection     CARDIAC CATHETERIZATION N/A 11/09/2014   Procedure: Right Heart Cath;  Surgeon: Larey Dresser, MD;  Location: [facility];  Service: Cardiovascular;  Laterality: N/A;   CARPAL TUNNEL RELEASE  20110 rt/lt   rt x2 , lt x1   COLONOSCOPY  06/2017   [facility]   ESOPHAGOGASTRODUODENOSCOPY  06/2017   [place]     MULTIPLE TOOTH EXTRACTIONS     NECK SURGERY  2009   PITUITARY SURGERY     Had gland removed from producing too much calcium   polp removed  2011   RIGHT/LEFT HEART CATH AND CORONARY ANGIOGRAPHY N/A 07/05/2017   Procedure: RIGHT/LEFT HEART CATH AND CORONARY ANGIOGRAPHY;  Surgeon: Larey Dresser, MD;  Location: [facility];  Service: Cardiovascular;  Laterality: N/A;   SHOULDER ARTHROSCOPY WITH ROTATOR CUFF REPAIR Right 05/23/2015   Procedure: RIGHT SHOULDER ARTHROSCOPY WITH REMOVAL OF SUTURE ANCHOR AND POSSIBLE REVISION ROTATOR CUFF REPAIR;  Surgeon: Justice Britain, MD;  Location: [facility];  Service: Orthopedics;  Laterality: Right;   SHOULDER ARTHROSCOPY WITH SUBACROMIAL DECOMPRESSION Right 01/24/2015   Procedure: RIGHT SHOULDER ARTHROSCOPY WITH SUBACROMIAL DECOMPRESSION AD DISTAL CLAVICLE RESECTION ;  Surgeon: Justice Britain, MD;  Location: [facility];  Service: Orthopedics;  Laterality: Right;   VAGINAL DELIVERY     x3   Family History:  Family History  Problem Relation Age of Onset   Coronary artery disease Father    Cancer Father        head neck    Esophageal cancer Father    Hypertension Mother    Schizophrenia Mother    Diabetes Mother    Heart attack Mother    Depression Brother    Suicidality Brother    Prostate cancer Brother    Cancer Brother        bone marrow   Schizophrenia  Maternal Grandmother    Anesthesia problems Neg Hx    Hypotension Neg Hx    Malignant hyperthermia Neg Hx    Pseudochol deficiency Neg Hx    Allergic rhinitis Neg Hx    Angioedema Neg Hx    Asthma Neg Hx    Atopy Neg Hx    Eczema Neg Hx    Immunodeficiency Neg Hx    Urticaria Neg Hx    Breast cancer Neg Hx    Family Psychiatric  History:  Social History:  Social History   Substance and Sexual Activity  Alcohol Use No   Alcohol/week: 0.0 standard drinks     Social History   Substance and Sexual Activity  Drug Use No   Comment: hx crack addiction 2008    Social History   Socioeconomic History   Marital status: Single    Spouse name: Not  on file   Number of children: 3   Years of education: 12   Highest education level: Some college, no degree  Occupational History   Occupation: disabled/retired  Tobacco Use   Smoking status: Some Days    Packs/day: 0.10    Years: 30.00    Pack years: 3.00    Types: Cigarettes   Smokeless tobacco: Never  Vaping Use   Vaping Use: Some days  Substance and Sexual Activity   Alcohol use: No    Alcohol/week: 0.0 standard drinks   Drug use: No    Comment: hx crack addiction 2008   Sexual activity: Never  Other Topics Concern   Not on file  Social History Narrative   Lives in a two story home alone      [place] [facility] [place] [facility]      Right handed   12th grade      Caffeine coffee 1 cup /day   Social Determinants of Health   Financial Resource Strain: Not on file  Food Insecurity: Not on file  Transportation Needs: Not on file  Physical Activity: Not on file  Stress: Not on file  Social Connections: Not on file   Additional Social History:                         Sleep: Good  Appetite:  Fair  Current Medications: Current Outpatient Medications  Medication Sig Dispense Refill   ASHWAGANDHA PO Take 1 capsule by mouth 2 (two) times daily.     azelastine (ASTELIN) 0.1 % nasal spray Place  1 spray into both nostrils 2 (two) times daily.     cetirizine (ZYRTEC ALLERGY) 10 MG tablet Take 1 tablet (10 mg total) by mouth daily. 90 tablet 0   cholecalciferol (VITAMIN D) 1000 UNITS tablet Take 1,000 Units by mouth daily.     clonazePAM (KLONOPIN) 1 MG tablet Take 1 tablet qam & 1 tablet qhs 60 tablet 4   DEXILANT 30 MG capsule Take 1 capsule by mouth daily.     diclofenac Sodium (VOLTAREN) 1 % GEL Apply topically 4 (four) times daily.     diphenhydrAMINE (BENADRYL) 25 MG tablet 1 tablet as needed     Echinacea-Goldenseal LIQD Place 1 drop into the nose daily as needed.     eszopiclone 3 MG TABS Take 1 tablet (3 mg total) by mouth at bedtime as needed. Take immediately before bedtime 30 tablet 3   Ginger, Zingiber officinalis, (GINGER EXTRACT PO) Take by mouth.     hydrocortisone (ANUSOL-HC) 2.5 % rectal cream PLACE 1 APPLICATION IN RECTUM 3 TIMES A DAY     hydrOXYzine (VISTARIL) 25 MG capsule Take 25 mg by mouth 2 (two) times daily.     Levomilnacipran HCl ER (FETZIMA) 20 MG CP24 1  qam 30 capsule 3   lidocaine (LIDODERM) 5 % Place 1 patch onto the skin daily. Remove & Discard patch within 12 hours or as directed by MD 30 patch 0   magic mouthwash SOLN Take 5 mLs by mouth 3 (three) times daily as needed for mouth pain. 100 mL 0   Magnesium 500 MG TABS 1 tablet     mirtazapine (REMERON) 30 MG tablet Take 1 tablet (30 mg total) by mouth at bedtime. 30 tablet 4   NON FORMULARY 2 (two) times daily. [place]     Omega-3 Fatty Acids (FISH OIL PO) Take by mouth 2 (two) times a day.  ondansetron (ZOFRAN) 8 MG tablet Take 1 tablet (8 mg total) by mouth every 8 (eight) hours as needed for nausea or vomiting. 20 tablet 3   oxyCODONE-acetaminophen (PERCOCET) 10-325 MG tablet Take 1 tablet by mouth 2 (two) times daily.     PAZEO 0.7 % SOLN PLACE 1 DROP INTO BOTH EYES ONCE DAILY as needed for allergies  3   potassium chloride SA (K-DUR,KLOR-CON) 20 MEQ tablet Take 20 mEq by mouth 2 (two) times  daily.     promethazine-dextromethorphan (PROMETHAZINE-DM) 6.25-15 MG/5ML syrup 5 ml as needed     rosuvastatin (CRESTOR) 10 MG tablet 1 tablet     SUMAtriptan (IMITREX) 20 MG/ACT nasal spray 1 spray at onset of headache in one nostril may repeat after 2 hours as needed     topiramate (TOPAMAX) 100 MG tablet Take 1 tablet (100 mg total) by mouth 2 (two) times daily. 60 tablet 6   triamterene-hydrochlorothiazide (DYAZIDE) 50-25 MG capsule Take 1 capsule by mouth daily.     TURMERIC PO Take by mouth as needed.     Ubrogepant (UBRELVY) 50 MG TABS Take one tablet onset migraine, may repeat in 2 hours if needed (max 2 tabs/24 hours) 10 tablet 5   valACYclovir (VALTREX) 1000 MG tablet Take 1,000 mg by mouth daily.     vitamin B-12 (CYANOCOBALAMIN) 1000 MCG tablet Take 1,000 mcg by mouth daily.     No current facility-administered medications for this visit.    Lab Results: No results found for this or any previous visit (from the past 48 hour(s)).  Physical Findings: AIMS:  , ,  ,  ,    CIWA:    COWS:     Musculoskeletal: Strength & Muscle Tone: within normal limits Gait & Station: normal Patient leans: N/A  Psychiatric Specialty Exam: ROS  There were no vitals taken for this visit.There is no height or weight on file to calculate BMI.  General Appearance: Casual  Eye Contact::  Good  Speech:  Clear and Coherent  Volume:  Normal  Mood:  Euthymic  Affect:  Congruent  Thought Process:  Coherent  Orientation:  Full (Time, Place, and Person)  Thought Content:  WDL  Suicidal Thoughts:  No  Homicidal Thoughts:  No  Memory:  NA  Judgement:  Good  Insight:  Fair  Psychomotor Activity:  Normal  Concentration:  Fair  Recall:  Good  Fund of Knowledge:Good  Language: Good  Akathisia:  No  Handed:  Right  AIMS (if indicated):     Assets:   ADL's:  Intact  Cognition: WNL  Sleep:       Treatment Plan  10/09/2020, 2:30 PM  Today the patient is reasonably stable.  She carries a  diagnosis of major depression.  At this time she will continue taking Remeron 30 mg and 15 to 20 mg.  Her second problem is that of an adjustment disorder with an anxious mood state.  She will be starting therapy soon which she will continue Klonopin 1 mg twice daily.  Her third problem is insomnia.  At this time we will restart her back on her Lunesta 3 mg.  Patient will be seen again in approximately 3 months.

## 2020-10-16 ENCOUNTER — Telehealth (HOSPITAL_COMMUNITY): Payer: Self-pay

## 2020-10-16 NOTE — Telephone Encounter (Signed)
[place] TRACKS PRESCRIPTION COVERAGE  APPROVED  LEVOMILNACIPRAN [facility]) 20MG  CAPSULE PA # [id number] EFFECTIVE 10/16/2020 TO 10/11/2021  S/W PATTY REFERENCE ID# [id number]  NOTIFIED PHARMACY. PT WILL BE NOTIFIED BY TEXT MESSAGE WHEN PT IS DUE FOR REFILL

## 2020-11-15 ENCOUNTER — Other Ambulatory Visit: Payer: Self-pay | Admitting: Family Medicine

## 2020-11-15 DIAGNOSIS — E2839 Other primary ovarian failure: Secondary | ICD-10-CM

## 2020-11-27 NOTE — Progress Notes (Signed)
Chief Complaint  Patient presents with   Follow-up    Rm 1 alone. Here for f/u on headaches. Reports her headaches have been frequent recently. Does not feel like her meds are helping much with her sx at this point.      HISTORY OF PRESENT ILLNESS: 11/28/20 ALL: Rain returns for follow up for migraines with aura. She continues topiramate '100mg'$  BID. Review of last rx date shows 11/25/2019 for 60 tablets wit6 refills. She is adamant that she takes this twice daily. Roselyn Meier works for abortive therapy. therapy that works well. She reports nortriptyline '50mg'$  was discontinued by previous psychiatrist but recently restarted by new psychiatrist. She tolerated it well in the past. Gabapentin was recently added for anxiety and restless legs by new psychiatrist. She has been afraid to start this medication. She is having about 20 headache days, 3 migraines. Roselyn Meier works well for abortive therapy. She reports being seen regularly by PCP.   02/14/2020 ALL:  Eileen Stanford is a 65 y.o. female here today for follow up for migraine with aura. She was restarted on topiramate '50mg'$  BID and Nurtec PRN. Dose was increased to '100mg'$  tiwice daily. She has continued nortriptyline '50mg'$  at bedtime prescribed by psychiatry. She is also taking Valium '5mg'$  BID and Remeron '30mg'$  daily for mood management. She feels that headaches wax and wane. She reports having 22 headache days a month. She has tried Nurtec about 6 times and felt it worsened Acid Reflux. It was effective in abortion therapy. She is taking Dexilant '30mg'$  daily for GERD.    MRI showed artery tortuosity most likely from HTN and small vessel disease. On Dyazide and fish oil, no statin. She has talked to PCP about having MRA/CTA but has decided to wait at this time. She has been working on diet changes over the past 3 years. She has lost about 60 pounds. She reports huge amounts of stress. She lives in an apartment complex. She is having a hard time with her  landlord. She is not sleeping well. She tries to sleep when her neighbors are awake to avoid conflicts. She does not have any other place she can live. [place] [address]. She feels that this contributes to her headaches. She is seeing psychiatrist every 3 months. She is walking at least 5 days a week. She does not snore. No daytime sleepiness.   HISTORY (copied from Dr Gladstone Lighter note on 08/14/2019)  65 year old female here for evaluation of headaches. Patient has had headaches since childhood with migraine features, throbbing severe headaches with nausea and sensitivity to light. Sometimes she sees visual disturbance before her headache starts like "raining" pattern. Patient averages three headaches per week. Triggers include stress, allergies and pain. She reports family history of brain aneurysm. Patient had several falls and injuries when she was younger where she hit her head on the ground.   Currently patient is struggling with right rotator cuff pain on pain medication. She does exercise five times per week. She drinks 1 cup of tea per day. Patient has remote history of substance abuse more than 25 years ago, currently abstinent. She does smoke cigarettes and is trying to quit. No alcohol use.   Patient is tried some migraine medications including topiramate, Imitrex, Maxalt, Ajovy, muscle relaxers, narcotics, benzodiazepines, trigger point injections without relief. In the past Topamax seem to work the best without any side effects.  REVIEW OF SYSTEMS: Out of a complete 14 system review of symptoms, the patient complains  only of the following symptoms, headaches, anxiety, chronic pain and all other reviewed systems are negative.   ALLERGIES: Allergies  Allergen Reactions   Effexor [Venlafaxine Hydrochloride] Itching and Other (See Comments)    headache   Latex Itching and Rash    Other reaction(s): Unknown   Penicillins Hives    Has patient had a PCN reaction causing immediate rash,  facial/tongue/throat swelling, SOB or lightheadedness with hypotension: Yes Has patient had a PCN reaction causing severe rash involving mucus membranes or skin necrosis: No Has patient had a PCN reaction that required hospitalization No Has patient had a PCN reaction occurring within the last 10 years: No If all of the above answers are "NO", then may proceed with Cephalosporin use.    Zithromax [Azithromycin Dihydrate] Swelling   Amlodipine Swelling   Atorvastatin Swelling    Muscle aches   Butrans [Buprenorphine] Other (See Comments)    Ulcers-"mouth would not heal"   Codeine Itching    Tolerable with benadryl   Nucynta [Tapentadol] Other (See Comments)    Ulcers inside of mouth   Nurtec [Rimegepant Sulfate] Nausea Only   Other     Other reaction(s): nausea Other reaction(s): itching Other reaction(s): itch Other reaction(s): HA Other reaction(s): Unknown   Pravastatin Sodium     Other reaction(s): itching   Propranolol Hcl     Other reaction(s): dyspnea   Sumatriptan     Other reaction(s): Unknown   Venlafaxine     Other reaction(s): HA   Vicodin [Hydrocodone-Acetaminophen] Itching   Amoxicillin Itching    Other reaction(s): itching   Chantix [Varenicline Tartrate] Nausea Only   Hydrocodone Itching    Tolerable with benadryl Other reaction(s): itch   Paroxetine Hcl Other (See Comments)    headache   Tramadol Other (See Comments)    Pt states it interacted with her sertraline, but she is no longer on sertraline.  She does not remember the type of reaction she had.  Other reaction(s): Fainted     HOME MEDICATIONS: Outpatient Medications Prior to Visit  Medication Sig Dispense Refill   ASHWAGANDHA PO Take 1 capsule by mouth 2 (two) times daily.     azelastine (ASTELIN) 0.1 % nasal spray Place 1 spray into both nostrils 2 (two) times daily.     cetirizine (ZYRTEC ALLERGY) 10 MG tablet Take 1 tablet (10 mg total) by mouth daily. 90 tablet 0   cholecalciferol  (VITAMIN D) 1000 UNITS tablet Take 1,000 Units by mouth daily.     clonazePAM (KLONOPIN) 1 MG tablet Take 1 tablet qam & 1 tablet qhs 60 tablet 4   DEXILANT 30 MG capsule Take 1 capsule by mouth daily.     diclofenac Sodium (VOLTAREN) 1 % GEL Apply topically 4 (four) times daily.     diphenhydrAMINE (BENADRYL) 25 MG tablet 1 tablet as needed     Echinacea-Goldenseal LIQD Place 1 drop into the nose daily as needed.     eszopiclone 3 MG TABS Take 1 tablet (3 mg total) by mouth at bedtime as needed. Take immediately before bedtime 30 tablet 3   Ginger, Zingiber officinalis, (GINGER EXTRACT PO) Take by mouth.     hydrocortisone (ANUSOL-HC) 2.5 % rectal cream PLACE 1 APPLICATION IN RECTUM 3 TIMES A DAY     hydrOXYzine (VISTARIL) 25 MG capsule Take 25 mg by mouth 2 (two) times daily.     Levomilnacipran HCl ER (FETZIMA) 20 MG CP24 1  qam 30 capsule 3   lidocaine (LIDODERM) 5 %  Place 1 patch onto the skin daily. Remove & Discard patch within 12 hours or as directed by MD 30 patch 0   Magnesium 500 MG TABS 1 tablet     mirtazapine (REMERON) 30 MG tablet Take 1 tablet (30 mg total) by mouth at bedtime. 30 tablet 4   NON FORMULARY 2 (two) times daily. [place]     Omega-3 Fatty Acids (FISH OIL PO) Take by mouth 2 (two) times a day.     ondansetron (ZOFRAN) 8 MG tablet Take 1 tablet (8 mg total) by mouth every 8 (eight) hours as needed for nausea or vomiting. 20 tablet 3   oxyCODONE-acetaminophen (PERCOCET) 10-325 MG tablet Take 1 tablet by mouth 2 (two) times daily.     potassium chloride SA (K-DUR,KLOR-CON) 20 MEQ tablet Take 20 mEq by mouth 2 (two) times daily.     promethazine-dextromethorphan (PROMETHAZINE-DM) 6.25-15 MG/5ML syrup 5 ml as needed     rosuvastatin (CRESTOR) 10 MG tablet 1 tablet     SUMAtriptan (IMITREX) 20 MG/ACT nasal spray 1 spray at onset of headache in one nostril may repeat after 2 hours as needed     topiramate (TOPAMAX) 100 MG tablet Take 1 tablet (100 mg total) by mouth 2 (two)  times daily. 60 tablet 6   triamterene-hydrochlorothiazide (DYAZIDE) 50-25 MG capsule Take 1 capsule by mouth daily.     TURMERIC PO Take by mouth as needed.     Ubrogepant (UBRELVY) 50 MG TABS Take one tablet onset migraine, may repeat in 2 hours if needed (max 2 tabs/24 hours) 10 tablet 5   valACYclovir (VALTREX) 1000 MG tablet Take 1,000 mg by mouth daily.     vitamin B-12 (CYANOCOBALAMIN) 1000 MCG tablet Take 1,000 mcg by mouth daily.     magic mouthwash SOLN Take 5 mLs by mouth 3 (three) times daily as needed for mouth pain. 100 mL 0   PAZEO 0.7 % SOLN PLACE 1 DROP INTO BOTH EYES ONCE DAILY as needed for allergies  3   No facility-administered medications prior to visit.     PAST MEDICAL HISTORY: Past Medical History:  Diagnosis Date   Anginal pain ([place])    admit 06/2014; had non-ischemic stress test   Anxiety    Bipolar 1 disorder ([facility])    Colon polyp    CTS (carpal tunnel syndrome)    Depression    Diabetes mellitus without complication ([facility])    Fever blister    GERD (gastroesophageal reflux disease)    HA (headache)    HTN (hypertension)    Hypercholesterolemia    Migraines    OA (osteoarthritis)    Schizo-affective psychosis ([place])      PAST SURGICAL HISTORY: Past Surgical History:  Procedure Laterality Date   ANTERIOR CERVICAL DECOMP/DISCECTOMY FUSION  08/27/2011   Procedure: ANTERIOR CERVICAL DECOMPRESSION/DISCECTOMY FUSION 1 LEVEL/HARDWARE REMOVAL;  Surgeon: Eustace Moore, MD;  Location: [facility];  Service: Neurosurgery;  Laterality: Bilateral;  Cervical four-five Anterior cervical decompression/diskectomy, fusion, Plate, Removal of Cervical five-seven Plate   back injection     CARDIAC CATHETERIZATION N/A 11/09/2014   Procedure: Right Heart Cath;  Surgeon: Larey Dresser, MD;  Location: [facility];  Service: Cardiovascular;  Laterality: N/A;   CARPAL TUNNEL RELEASE  20110 rt/lt   rt x2 , lt x1   COLONOSCOPY  06/2017   [facility]    ESOPHAGOGASTRODUODENOSCOPY  06/2017   [facility]  EXTRACTIONS     NECK SURGERY  2009   PITUITARY SURGERY     Had gland removed from producing too much calcium   polp removed  2011   RIGHT/LEFT HEART CATH AND CORONARY ANGIOGRAPHY N/A 07/05/2017   Procedure: RIGHT/LEFT HEART CATH AND CORONARY ANGIOGRAPHY;  Surgeon: Larey Dresser, MD;  Location: [facility];  Service: Cardiovascular;  Laterality: N/A;   SHOULDER ARTHROSCOPY WITH ROTATOR CUFF REPAIR Right 05/23/2015   Procedure: RIGHT SHOULDER ARTHROSCOPY WITH REMOVAL OF SUTURE ANCHOR AND POSSIBLE REVISION ROTATOR CUFF REPAIR;  Surgeon: Justice Britain, MD;  Location: [facility];  Service: Orthopedics;  Laterality: Right;   SHOULDER ARTHROSCOPY WITH SUBACROMIAL DECOMPRESSION Right 01/24/2015   Procedure: RIGHT SHOULDER ARTHROSCOPY WITH SUBACROMIAL DECOMPRESSION AD DISTAL CLAVICLE RESECTION ;  Surgeon: Justice Britain, MD;  Location: [facility];  Service: Orthopedics;  Laterality: Right;   VAGINAL DELIVERY     x3     FAMILY HISTORY: Family History  Problem Relation Age of Onset   Coronary artery disease Father    Cancer Father        head neck    Esophageal cancer Father    Hypertension Mother    Schizophrenia Mother    Diabetes Mother    Heart attack Mother    Depression Brother    Suicidality Brother    Prostate cancer Brother    Cancer Brother        bone marrow   Schizophrenia Maternal Grandmother    Anesthesia problems Neg Hx    Hypotension Neg Hx    Malignant hyperthermia Neg Hx    Pseudochol deficiency Neg Hx    Allergic rhinitis Neg Hx    Angioedema Neg Hx    Asthma Neg Hx    Atopy Neg Hx    Eczema Neg Hx    Immunodeficiency Neg Hx    Urticaria Neg Hx    Breast cancer Neg Hx      SOCIAL HISTORY: Social History   Socioeconomic History   Marital status: Single    Spouse name: Not on file   Number of children: 3   Years of education: 12   Highest education level:  Some college, no degree  Occupational History   Occupation: disabled/retired  Tobacco Use   Smoking status: Some Days    Packs/day: 0.10    Years: 30.00    Pack years: 3.00    Types: Cigarettes   Smokeless tobacco: Never  Vaping Use   Vaping Use: Some days  Substance and Sexual Activity   Alcohol use: No    Alcohol/week: 0.0 standard drinks   Drug use: No    Comment: hx crack addiction 2008   Sexual activity: Never  Other Topics Concern   Not on file  Social History Narrative   Lives in a two story home alone      [place] in [place] for pain management      Right handed   12th grade      Caffeine coffee 1 cup /day   Social Determinants of Health   Financial Resource Strain: Not on file  Food Insecurity: Not on file  Transportation Needs: Not on file  Physical Activity: Not on file  Stress: Not on file  Social Connections: Not on file  Intimate Partner Violence: Not on file      PHYSICAL EXAM  Vitals:   11/28/20 0903  BP: 132/80  Pulse: 68  SpO2: 97%  Weight: 232 lb (105.2 kg)  Height: 5'  9" (1.753 m)    Body mass index is 34.26 kg/m.   Generalized: Well developed, in no acute distress   Cardiology: Normal rate and rhythm, no murmur auscultated Respiratory: Clear to auscultation bilaterally  Neurological examination  Mentation: Alert oriented to time, place, history taking. Follows all commands speech and language fluent Cranial nerve II-XII: Pupils were equal round reactive to light. Extraocular movements were full, visual field were full  Motor: The motor testing reveals 5 over 5 strength of all 4 extremities. Good symmetric motor tone is noted throughout.  Gait and station: Gait is normal.     DIAGNOSTIC DATA (LABS, IMAGING, TESTING) - I reviewed patient records, labs, notes, testing and imaging myself where available.  Lab Results  Component Value Date   WBC 7.0 09/15/2019   HGB 14.1 09/15/2019   HCT 42.6 09/15/2019   MCV 86.2  09/15/2019   PLT 381 09/15/2019      Component Value Date/Time   NA 139 09/15/2019 1256   K 3.7 09/15/2019 1256   CL 101 09/15/2019 1256   CO2 27 09/15/2019 1256   GLUCOSE 97 09/15/2019 1256   BUN 19 09/15/2019 1256   CREATININE 0.97 09/15/2019 1256   CALCIUM 9.6 09/15/2019 1256   PROT 6.8 11/06/2017 1147   ALBUMIN 3.7 11/06/2017 1147   AST 32 11/06/2017 1147   ALT 20 11/06/2017 1147   ALKPHOS 104 11/06/2017 1147   BILITOT 0.7 11/06/2017 1147   GFRNONAA >60 09/15/2019 1256   GFRAA >60 09/15/2019 1256   Lab Results  Component Value Date   CHOL 176 08/11/2017   HDL 43 08/11/2017   LDLCALC 95 08/11/2017   TRIG 189 (H) 08/11/2017   CHOLHDL 4.1 08/11/2017   Lab Results  Component Value Date   HGBA1C 6.2 (H) 08/11/2017   Lab Results  Component Value Date   VITAMINB12 473 08/11/2017   Lab Results  Component Value Date   TSH 1.015 08/11/2017      ASSESSMENT AND PLAN  65 y.o. year old female  has a past medical history of Anginal pain ([place]), Anxiety, Bipolar 1 disorder ([place]), Colon polyp, CTS (carpal tunnel syndrome), Depression, Diabetes mellitus without complication ([place]), Fever blister, GERD (gastroesophageal reflux disease), HA (headache), HTN (hypertension), Hypercholesterolemia, Migraines, OA (osteoarthritis), and Schizo-affective psychosis ([place]). here with   Migraine with aura and without status migrainosus, not intractable  Allie continues to have regular headache days but has been off nortriptyline. New psychiatrist restarted nortriptyline '50mg'$  QHS and started gabapentin '300mg'$  daily. I have encouraged her to start these medications as directed as they could be helpful in migraine management as well. She will continue topiramate '100mg'$  BID and Ubrelvy as needed. She will continue to follow-up closely with primary care.  Consider imaging as indicated per PCP. No obvious concerns of aneurysm on last MRI or given in history. Healthy lifestyle habits encouraged.  Continue  close follow-up with psychiatry.  Stress management discussed.  She will follow up with [place] in 6 months.    Debbora Presto, MSN, FNP-C 11/28/2020, 9:06 AM  [facility] [address], [place], [place] [address] [phone]

## 2020-11-27 NOTE — Patient Instructions (Signed)
Below is our plan:  We will continue topiramate '100mg'$  twice daily and Ubrelvy as needed. Please take 1 tablet at onset of headache. May take 1 additional tablet in 2 hours if needed. Do not take more than 2 tablets in 24 hours or more than 8 in a month.   I agree with psychiatrist regarding restarting nortriptyline '50mg'$  at bedtime. I also feel gabapentin '300mg'$  at bedtime could be helpful in managing headaches.   Please make sure you are staying well hydrated. I recommend 50-60 ounces daily. Well balanced diet and regular exercise encouraged. Consistent sleep schedule with 6-8 hours recommended.   Please continue follow up with care team as directed.   Follow up with me in 6 months   You may receive a survey regarding today's visit. I encourage you to leave honest feed back as I do use this information to improve patient care. Thank you for seeing me today!

## 2020-11-28 ENCOUNTER — Ambulatory Visit: Payer: Medicaid Other | Admitting: Family Medicine

## 2020-11-28 ENCOUNTER — Encounter: Payer: Self-pay | Admitting: Family Medicine

## 2020-11-28 VITALS — BP 132/80 | HR 68 | Ht 69.0 in | Wt 232.0 lb

## 2020-11-28 DIAGNOSIS — G43109 Migraine with aura, not intractable, without status migrainosus: Secondary | ICD-10-CM

## 2020-11-28 MED ORDER — TOPIRAMATE 100 MG PO TABS: 100.0000 mg | ORAL_TABLET | Freq: Two times a day (BID) | ORAL | 3 refills | Status: DC

## 2020-11-28 MED ORDER — UBRELVY 50 MG PO TABS: ORAL_TABLET | ORAL | 5 refills | Status: DC

## 2021-01-15 ENCOUNTER — Ambulatory Visit (HOSPITAL_BASED_OUTPATIENT_CLINIC_OR_DEPARTMENT_OTHER): Payer: Medicaid Other | Admitting: Psychiatry

## 2021-01-15 ENCOUNTER — Other Ambulatory Visit: Payer: Self-pay

## 2021-01-15 DIAGNOSIS — F325 Major depressive disorder, single episode, in full remission: Secondary | ICD-10-CM

## 2021-01-15 MED ORDER — TEMAZEPAM 15 MG PO CAPS
15.0000 mg | ORAL_CAPSULE | Freq: Every evening | ORAL | 0 refills | Status: DC | PRN
Start: 2021-01-15 — End: 2021-04-16

## 2021-01-15 MED ORDER — MIRTAZAPINE 30 MG PO TABS: 30.0000 mg | ORAL_TABLET | Freq: Every day | ORAL | 4 refills | Status: DC

## 2021-01-15 MED ORDER — CLONAZEPAM 1 MG PO TABS: ORAL_TABLET | ORAL | 4 refills | Status: DC

## 2021-01-15 NOTE — Progress Notes (Signed)
Patient ID: Gloria Lewis, female   DOB: May 22, 1955, 65 y.o.   MRN: [id number] [facility] MD Progress Note  01/15/2021 3:23 PM Gloria Lewis  MRN:  [id number] Subjective:  Shoulder hurting Principal Problem: Major Depression,recurent Mild Diagnosis: Adjustment disorder with an anxious mood stat  Today the patient is actually doing pretty well.  Her son Gloria Lewis is still incarcerated.  He is in the system in [place].  She makes contact with the person who is in charge of his care every day.  The patient is a tremendous advocate for victims assistance and other social agencies.  Her other son [name] makes contact with her when he needs something.  The patient still goes to the gym and swim some.  The patient environment where she lives seems to be better.  The patient continues in therapy seeing a therapist named Stanton Kidney at the [facility].  The patient has a new primary care doctor Dr. Gustavo Lah who is adjusted her diet and she is doing much better.  Her weight is stable.  She denied having any GI symptomatology.  The patient denies daily depression.  She denies any manic-like symptoms.  She has no evidence of psychosis.  She takes her medicines just as prescribed.  It is noted that in the past she took trazodone and it produced headaches.  Unbelievably she has been getting Johnnye Sima but somebody is been helping her pay for it.  She says it is normally cost $300.  Today we will discontinue it.  It is noted the patient's daughter died in Jul 06, 2016.  The patient is actually functioning pretty well.  She keeps busy all week.  She takes Thursdays off just 5 minutes.  Overall she is actually functioning quite well.  The patient drinks no alcohol and uses no drugs.  Patient Active Problem List   Diagnosis Date Noted   Infectious gastroenteritis [A09] 04/16/2016   Xerostomia [K11.7] 03/09/2016   Surgery, elective [Z41.9] 05/23/2015   S/P arthroscopy of shoulder [Z98.890] 05/23/2015   Chronic migraine without aura  without status migrainosus, not intractable [G43.709] 10/18/2014   Tobacco abuse [Z72.0] 10/18/2014   Obesity [E66.9] 09/23/2014   COPD [J44.9] 09/02/2014   Pulmonary hypertension ([place]) [I27.20] 08/31/2014   Respiratory failure with hypoxia ([place]) [J96.91] 08/14/2014   Cigarette smoker [F17.210] 07/28/2014   Major depressive disorder, recurrent episode, moderate (HCC) [F33.1] 07/06/2014   Essential hypertension [I10]    SOB (shortness of breath) [R06.02] 07-07-2014   Precordial pain [R07.2] 07/07/2014   Gastroesophageal reflux disease [K21.9] 2014-07-07   Chest pain [R07.9] 07/07/2014   HTN (hypertension) [I10]    Neck pain [M54.2] 01/08/2014   Major depressive disorder, recurrent episode, severe, without mention of psychotic behavior [F33.2] 10/14/2012   Schizoaffective disorder ([place]) [F25.9] 05/26/2012   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] 10/06/2010   Hyperparathyroidism, primary ([place]) [E21.0] 10/06/2010   DEGENERATIVE DISC DISEASE, LUMBOSACRAL SPINE [M51.37] 05/21/2007   DERMATOPHYTOSIS OF THE BODY [B35.4] 05/10/2007   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] 03/24/2007   Anxiety state [F41.1] 03/24/2007   DENTAL PAIN [K08.9] 03/24/2007   SHOULDER PAIN, LEFT [M25.519] 03/24/2007   Total Time spent with patient:30 min  Past Psychiatric History:   Past Medical History:  Past Medical History:  Diagnosis Date   Anginal pain ([place])    admit Jul 07, 2014; had non-ischemic stress test   Anxiety    Bipolar 1 disorder (HCC)    Colon polyp    CTS (carpal tunnel syndrome)    Depression    Diabetes mellitus without  complication (HCC)    Fever blister    GERD (gastroesophageal reflux disease)    HA (headache)    HTN (hypertension)    Hypercholesterolemia    Migraines    OA (osteoarthritis)    Schizo-affective psychosis ([place])     Past Surgical History:  Procedure Laterality Date   ANTERIOR CERVICAL DECOMP/DISCECTOMY FUSION  08/27/2011   Procedure: ANTERIOR CERVICAL DECOMPRESSION/DISCECTOMY FUSION  1 LEVEL/HARDWARE REMOVAL;  Surgeon: Eustace Moore, MD;  Location: [place] [facility];  Service: Neurosurgery;  Laterality: Bilateral;  Cervical four-five Anterior cervical decompression/diskectomy, fusion, Plate, Removal of Cervical five-seven Plate   back injection     CARDIAC CATHETERIZATION N/A 11/09/2014   Procedure: Right Heart Cath;  Surgeon: Larey Dresser, MD;  Location: [place] [facility];  Service: Cardiovascular;  Laterality: N/A;   CARPAL TUNNEL RELEASE  20110 rt/lt   rt x2 , lt x1   COLONOSCOPY  06/2017   [facility]   ESOPHAGOGASTRODUODENOSCOPY  06/2017   [facility]     MULTIPLE TOOTH EXTRACTIONS     NECK SURGERY  2009   PITUITARY SURGERY     Had gland removed from producing too much calcium   polp removed  2011   RIGHT/LEFT HEART CATH AND CORONARY ANGIOGRAPHY N/A 07/05/2017   Procedure: RIGHT/LEFT HEART CATH AND CORONARY ANGIOGRAPHY;  Surgeon: Larey Dresser, MD;  Location: [facility] CV LAB;  Service: Cardiovascular;  Laterality: N/A;   SHOULDER ARTHROSCOPY WITH ROTATOR CUFF REPAIR Right 05/23/2015   Procedure: RIGHT SHOULDER ARTHROSCOPY WITH REMOVAL OF SUTURE ANCHOR AND POSSIBLE REVISION ROTATOR CUFF REPAIR;  Surgeon: Justice Britain, MD;  Location: [facility];  Service: Orthopedics;  Laterality: Right;   SHOULDER ARTHROSCOPY WITH SUBACROMIAL DECOMPRESSION Right 01/24/2015   Procedure: RIGHT SHOULDER ARTHROSCOPY WITH SUBACROMIAL DECOMPRESSION AD DISTAL CLAVICLE RESECTION ;  Surgeon: Justice Britain, MD;  Location: [facility];  Service: Orthopedics;  Laterality: Right;   VAGINAL DELIVERY     x3   Family History:  Family History  Problem Relation Age of Onset   Coronary artery disease Father    Cancer Father        head neck    Esophageal cancer Father    Hypertension Mother    Schizophrenia Mother    Diabetes Mother    Heart attack Mother    Depression Brother    Suicidality Brother    Prostate cancer Brother    Cancer Brother        bone  marrow   Schizophrenia Maternal Grandmother    Anesthesia problems Neg Hx    Hypotension Neg Hx    Malignant hyperthermia Neg Hx    Pseudochol deficiency Neg Hx    Allergic rhinitis Neg Hx    Angioedema Neg Hx    Asthma Neg Hx    Atopy Neg Hx    Eczema Neg Hx    Immunodeficiency Neg Hx    Urticaria Neg Hx    Breast cancer Neg Hx    Family Psychiatric  History:  Social History:  Social History   Substance and Sexual Activity  Alcohol Use No   Alcohol/week: 0.0 standard drinks     Social History   Substance and Sexual Activity  Drug Use No   Comment: hx crack addiction 2008    Social History   Socioeconomic History   Marital status: Single    Spouse name: Not on file   Number of children: 3   Years of education: 77  Highest education level: Some college, no degree  Occupational History   Occupation: disabled/retired  Tobacco Use   Smoking status: Some Days    Packs/day: 0.10    Years: 30.00    Pack years: 3.00    Types: Cigarettes   Smokeless tobacco: Never  Vaping Use   Vaping Use: Some days  Substance and Sexual Activity   Alcohol use: No    Alcohol/week: 0.0 standard drinks   Drug use: No    Comment: hx crack addiction 2008   Sexual activity: Never  Other Topics Concern   Not on file  Social History Narrative   Lives in a two story home alone      [employer] in [place] for pain management      Right handed   12th grade      Caffeine coffee 1 cup /day   Social Determinants of Health   Financial Resource Strain: Not on file  Food Insecurity: Not on file  Transportation Needs: Not on file  Physical Activity: Not on file  Stress: Not on file  Social Connections: Not on file   Additional Social History:                         Sleep: Good  Appetite:  Fair  Current Medications: Current Outpatient Medications  Medication Sig Dispense Refill   temazepam (RESTORIL) 15 MG capsule Take 1 capsule (15 mg total) by mouth at bedtime as  needed for sleep. 30 capsule 0   ASHWAGANDHA PO Take 1 capsule by mouth 2 (two) times daily.     azelastine (ASTELIN) 0.1 % nasal spray Place 1 spray into both nostrils 2 (two) times daily.     cetirizine (ZYRTEC ALLERGY) 10 MG tablet Take 1 tablet (10 mg total) by mouth daily. 90 tablet 0   cholecalciferol (VITAMIN D) 1000 UNITS tablet Take 1,000 Units by mouth daily.     clonazePAM (KLONOPIN) 1 MG tablet Take 1 tablet qam & 1 tablet qhs 60 tablet 4   DEXILANT 30 MG capsule Take 1 capsule by mouth daily.     diclofenac Sodium (VOLTAREN) 1 % GEL Apply topically 4 (four) times daily.     diphenhydrAMINE (BENADRYL) 25 MG tablet 1 tablet as needed     Echinacea-Goldenseal LIQD Place 1 drop into the nose daily as needed.     eszopiclone 3 MG TABS Take 1 tablet (3 mg total) by mouth at bedtime as needed. Take immediately before bedtime 30 tablet 3   Ginger, Zingiber officinalis, (GINGER EXTRACT PO) Take by mouth.     hydrocortisone (ANUSOL-HC) 2.5 % rectal cream PLACE 1 APPLICATION IN RECTUM 3 TIMES A DAY     hydrOXYzine (VISTARIL) 25 MG capsule Take 25 mg by mouth 2 (two) times daily.     Levomilnacipran HCl ER (FETZIMA) 20 MG CP24 1  qam 30 capsule 3   lidocaine (LIDODERM) 5 % Place 1 patch onto the skin daily. Remove & Discard patch within 12 hours or as directed by MD 30 patch 0   Magnesium 500 MG TABS 1 tablet     mirtazapine (REMERON) 30 MG tablet Take 1 tablet (30 mg total) by mouth at bedtime. 30 tablet 4   NON FORMULARY 2 (two) times daily. [place]     Omega-3 Fatty Acids (FISH OIL PO) Take by mouth 2 (two) times a day.     ondansetron (ZOFRAN) 8 MG tablet Take 1 tablet (8 mg total) by  mouth every 8 (eight) hours as needed for nausea or vomiting. 20 tablet 3   oxyCODONE-acetaminophen (PERCOCET) 10-325 MG tablet Take 1 tablet by mouth 2 (two) times daily.     potassium chloride SA (K-DUR,KLOR-CON) 20 MEQ tablet Take 20 mEq by mouth 2 (two) times daily.     promethazine-dextromethorphan  (PROMETHAZINE-DM) 6.25-15 MG/5ML syrup 5 ml as needed     rosuvastatin (CRESTOR) 10 MG tablet 1 tablet     topiramate (TOPAMAX) 100 MG tablet Take 1 tablet (100 mg total) by mouth 2 (two) times daily. 180 tablet 3   triamterene-hydrochlorothiazide (DYAZIDE) 50-25 MG capsule Take 1 capsule by mouth daily.     TURMERIC PO Take by mouth as needed.     Ubrogepant (UBRELVY) 50 MG TABS Take one tablet onset migraine, may repeat in 2 hours if needed (max 2 tabs/24 hours) 10 tablet 5   valACYclovir (VALTREX) 1000 MG tablet Take 1,000 mg by mouth daily.     vitamin B-12 (CYANOCOBALAMIN) 1000 MCG tablet Take 1,000 mcg by mouth daily.     No current facility-administered medications for this visit.    Lab Results: No results found for this or any previous visit (from the past 48 hour(s)).  Physical Findings: AIMS:  , ,  ,  ,    CIWA:    COWS:     Musculoskeletal: Strength & Muscle Tone: within normal limits Gait & Station: normal Patient leans: N/A  Psychiatric Specialty Exam: ROS  There were no vitals taken for this visit.There is no height or weight on file to calculate BMI.  General Appearance: Casual  Eye Contact::  Good  Speech:  Clear and Coherent  Volume:  Normal  Mood:  Euthymic  Affect:  Congruent  Thought Process:  Coherent  Orientation:  Full (Time, Place, and Person)  Thought Content:  WDL  Suicidal Thoughts:  No  Homicidal Thoughts:  No  Memory:  NA  Judgement:  Good  Insight:  Fair  Psychomotor Activity:  Normal  Concentration:  Fair  Recall:  Good  Fund of Knowledge:Good  Language: Good  Akathisia:  No  Handed:  Right  AIMS (if indicated):     Assets:   ADL's:  Intact  Cognition: WNL  Sleep:       Treatment Plan  01/15/2021, 3:23 PM    This patient's first problem is that of clinical major depression.  She will continue taking Remeron.  Her second problem is insomnia.  Today we will discontinue her Lunesta and begin her on Restoril 15 mg.  Her third  problem is that of an adjustment disorder with an anxious mood state.  The patient will continue taking Klonopin 1 mg twice daily.  She always takes his medicine appropriately.  She has had no falls.  She is very compliant with her medications.  In my view she is quite stable.  She will return to see me in person in 3 months.

## 2021-01-22 ENCOUNTER — Ambulatory Visit (HOSPITAL_COMMUNITY): Payer: Medicaid Other | Admitting: Psychiatry

## 2021-03-14 ENCOUNTER — Other Ambulatory Visit (HOSPITAL_COMMUNITY): Payer: Self-pay | Admitting: *Deleted

## 2021-03-14 MED ORDER — HYDROXYZINE PAMOATE 25 MG PO CAPS: 25.0000 mg | ORAL_CAPSULE | Freq: Two times a day (BID) | ORAL | 0 refills | Status: DC | PRN

## 2021-04-02 ENCOUNTER — Ambulatory Visit (INDEPENDENT_AMBULATORY_CARE_PROVIDER_SITE_OTHER): Payer: Medicaid Other | Admitting: Licensed Clinical Social Worker

## 2021-04-02 ENCOUNTER — Other Ambulatory Visit: Payer: Self-pay

## 2021-04-02 DIAGNOSIS — F331 Major depressive disorder, recurrent, moderate: Secondary | ICD-10-CM | POA: Diagnosis not present

## 2021-04-02 NOTE — Progress Notes (Signed)
Comprehensive Clinical Assessment (CCA) Note  04/02/2021 Gloria Lewis [id number]  Chief Complaint:  Chief Complaint  Patient presents with   Depression   Anxiety    Hx of schizoaffective dis   Visit Diagnosis: MDD, recurrent, moderate   CCA Biopsychosocial Intake/Chief Complaint:  Hx of schizoaffective dis, dep, anx  Current Symptoms/Problems: racing thoughts, irritable, tearful, poor sleep, painc, tension, worry. Reports her 64 yr old son with schizophrenia came to live with her a month ago after 8 mon of incarceration which is a huge adjustment. She is legal guardian. "He doesn't want to listen" and his MH is poorly managed creating significant stress for [place].  Patient Reported Schizophrenia/Schizoaffective Diagnosis in Past: Yes  Strengths: Open to help, motivated to participate in counseling  Preferences: In person sessions  Abilities: own transportation  Type of Services Patient Feels are Needed: counseling and med management   Initial Clinical Notes/Concerns: Patient comes in for assessment to establish care.  Patient reports she has had counseling on and off in her lifetime with the last time probably being in 2015.  LCSW reviewed informed consent for counseling with patient's full acknowledgment.  Patient is currently on medication regimen provided by Dr. Casimiro Needle yet the patient reports she believes he is getting ready to retire and would like to establish care here for meds.  Patient states her primary concern at this time is adjusting to her son moving in with her "1 month and 1 day ago" after being incarcerated 8 mon. Son has schizophrenia which is poorly managed at present.  Because patient's been named his legal guardian, Gloria Lewis intends to get her son back on track.  He is reportedly having to be reminded about everything and she is having to fix his meals.  She names multiple resources she is already connected to for son's care including an act team.  Patient reports  she is also engaging in a stop smoking effort.   Mental Health Symptoms Depression:   Change in energy/activity; Tearfulness; Sleep (too much or little); Irritability   Duration of Depressive symptoms:  Greater than two weeks   Mania:   None   Anxiety:    Worrying; Tension; Sleep; Irritability   Psychosis:   None   Duration of Psychotic symptoms: No data recorded  Trauma:   -- (Numerous losses, needs additional assessment)   Obsessions:   None   Compulsions:   None   Inattention:   None   Hyperactivity/Impulsivity:   None   Oppositional/Defiant Behaviors:   None   Emotional Irregularity:   Mood lability   Other Mood/Personality Symptoms:  No data recorded   Mental Status Exam Appearance and self-care  Stature:   Average   Weight:   Overweight   Clothing:   Casual   Grooming:   Normal   Cosmetic use:   None   Posture/gait:   Normal   Motor activity:   Not Remarkable   Sensorium  Attention:   Normal   Concentration:   Variable   Orientation:   X5   Recall/memory:   Normal   Affect and Mood  Affect:   Depressed; Tearful   Mood:   Depressed   Relating  Eye contact:   Normal   Facial expression:   Responsive   Attitude toward examiner:   Cooperative   Thought and Language  Speech flow:  Normal   Thought content:   Appropriate to Mood and Circumstances   Preoccupation:   Other (Comment) (Adjusting to son with  poorly managed schizophrenia moving in with her a mon ago)   Hallucinations:   None   Organization:  No data recorded  [employer] of Knowledge:   Good   Intelligence:   Average   Abstraction:   Normal   Judgement:   Normal   Reality Testing:   Realistic   Insight:   Good   Decision Making:   Normal   Social Functioning  Social Maturity:   Responsible   Social Judgement:   Normal   Stress  Stressors:   Family conflict; Grief/losses; Housing; Museum/gallery curator;  Transitions   Coping Ability:   Overwhelmed; Exhausted; Deficient supports   Skill Deficits:   None   Supports:   Support needed     Religion: Religion/Spirituality Are You A Religious Person?: Yes What is Your Religious Affiliation?: Christian  Leisure/Recreation: Leisure / Recreation Do You Have Hobbies?: Yes Leisure and Hobbies: reading, hiking, staying socially active  Exercise/Diet: Exercise/Diet Do You Exercise?: Yes What Type of Exercise Do You Do?: Swimming, Run/Walk, Hiking (Reports she swims 5xwk and tries to walk a mile a day.) How Many Times a Week Do You Exercise?: 6-7 times a week Do You Have Any Trouble Sleeping?: Yes Explanation of Sleeping Difficulties: Trouble falling and staying asleep  CCA Employment/Education Employment/Work Situation: Employment / Work Situation Employment Situation: On disability Why is Patient on Disability: MH and physical health How Long has Patient Been on Disability: 2006-07-03  Education: Education Last Grade Completed: 12 Did Teacher, adult education From [employer]?: Yes Did Physicist, medical?: Yes What Type of College Degree Do you Have?: Three yrs of college then stopped as son got sick/schizophrenia What Was Your Major?: psychology with minor in hx  CCA Family/Childhood History Family and Relationship History: Family history Marital status: Single (Never married) What is your sexual orientation?: straight, no current relationship Does patient have children?: Yes How many children?: 3 How is patient's relationship with their children?: one dtr killed in 2016-07-02 by being ran over with motor vehicle, intentional.  One son, 73, who has schizophrenia and recently moved in with her after 8 mon incarceration. She is legal guardian for this son, Gloria Lewis. Another son, Gloria Lewis, who is 74 lives in [place] and "trying to become more independent" at his age.  Childhood History:  Childhood History By whom was/is the patient raised?: Both  parents Additional childhood history information: favored father, states he had an alcohol abuse problem Description of patient's relationship with caregiver when they were a child: Super with dad, uneven with mom who had unmanaged schizophrenia Patient's description of current relationship with people who raised him/her: mom died July 02, 2000, dad died 28 How were you disciplined when you got in trouble as a child/adolescent?: sent to her room to read a book Does patient have siblings?: Yes Number of Siblings: 2 Description of patient's current relationship with siblings: one bro in and out of prison, committed suicide in July 03, 1999 in parents bathroom; [place] had to go identify the body.  "Used to be really bad" with only living bro, getting better, has bone CA, local, 9 yrs older. Did patient suffer any verbal/emotional/physical/sexual abuse as a child?: No Did patient suffer from severe childhood neglect?: No Has patient ever been sexually abused/assaulted/raped as an adolescent or adult?: No Witnessed domestic violence?: No Has patient been affected by domestic violence as an adult?: No  CCA Substance Use Alcohol/Drug Use: Alcohol / Drug Use History of alcohol / drug use?: Yes (Reports she used multiple substances when  younger. Last use 2003 after admitting herself to hosp for tx. No relapses.)   DSM5 Diagnoses: Patient Active Problem List   Diagnosis Date Noted   Infectious gastroenteritis 04/16/2016   Xerostomia 03/09/2016   Surgery, elective 05/23/2015   S/P arthroscopy of shoulder 05/23/2015   Chronic migraine without aura without status migrainosus, not intractable 10/18/2014   Tobacco abuse 10/18/2014   Obesity 09/23/2014   COPD 09/02/2014   Pulmonary hypertension ([place]) 08/31/2014   Respiratory failure with hypoxia ([place]) 08/14/2014   Cigarette smoker 07/28/2014   Major depressive disorder, recurrent episode, moderate ([place]) 07/06/2014   Essential hypertension    SOB (shortness of  breath) 06/21/2014   Precordial pain 06/21/2014   Gastroesophageal reflux disease 06/21/2014   Chest pain 06/21/2014   HTN (hypertension)    Neck pain 01/08/2014   Major depressive disorder, recurrent episode, severe, without mention of psychotic behavior 10/14/2012   Schizoaffective disorder ([place]) 05/26/2012   Parathyroid adenoma 10/06/2010   Hyperparathyroidism, primary ([place]) 10/06/2010   DEGENERATIVE [place] DISEASE, LUMBOSACRAL SPINE 05/21/2007   DERMATOPHYTOSIS OF THE BODY 05/10/2007   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

## 2021-04-03 ENCOUNTER — Telehealth (HOSPITAL_COMMUNITY): Payer: Self-pay

## 2021-04-03 NOTE — Telephone Encounter (Signed)
SPOKE WITH ALLISON FROM  [employer] TO DO A PA ON PATIENT'S CLONAZEPAM 1MG  TABLET. ALLISON STATED THAT REGULAR CLONAZEPAM DOES NOT REQUIRE A PA (ONLY THE DISINTEGRATING TABLET). SHE STATED THAT THE MEDICATION ONLY REQUIRES A PHARMACY OVERRIDE - #10 IN THE SUBMISSION CLARIFICATION FIELD  REFERENCE ID # [id number]

## 2021-04-04 NOTE — Telephone Encounter (Signed)
Spoke with [employer] on [address] in [place] to inform them of the APPROVAL on patient's Clonazepam 1 mg tablet and the override. Pharmacist stated that patient already picked up her medication and uses a savings card

## 2021-04-08 ENCOUNTER — Other Ambulatory Visit (HOSPITAL_COMMUNITY): Payer: Self-pay | Admitting: Psychiatry

## 2021-04-16 ENCOUNTER — Ambulatory Visit (HOSPITAL_BASED_OUTPATIENT_CLINIC_OR_DEPARTMENT_OTHER): Payer: Medicaid Other | Admitting: Psychiatry

## 2021-04-16 ENCOUNTER — Other Ambulatory Visit: Payer: Self-pay

## 2021-04-16 DIAGNOSIS — F325 Major depressive disorder, single episode, in full remission: Secondary | ICD-10-CM | POA: Diagnosis not present

## 2021-04-16 MED ORDER — MIRTAZAPINE 30 MG PO TABS: 30.0000 mg | ORAL_TABLET | Freq: Every day | ORAL | 4 refills | Status: DC

## 2021-04-16 MED ORDER — HYDROXYZINE PAMOATE 25 MG PO CAPS: 25.0000 mg | ORAL_CAPSULE | Freq: Two times a day (BID) | ORAL | 5 refills | Status: DC | PRN

## 2021-04-16 MED ORDER — TEMAZEPAM 15 MG PO CAPS: 15.0000 mg | ORAL_CAPSULE | Freq: Every evening | ORAL | 4 refills | Status: DC | PRN

## 2021-04-16 MED ORDER — CLONAZEPAM 1 MG PO TABS: ORAL_TABLET | ORAL | 4 refills | Status: DC

## 2021-04-16 NOTE — Progress Notes (Signed)
Patient ID: Gloria Lewis, female   DOB: 02/24/1956, 65 y.o.   MRN: [id number] [facility] MD Progress Note  04/16/2021 3:41 PM Gloria Lewis  MRN:  [id number] Subjective:  Shoulder hurting Principal Problem: Major Depression,recurent Mild Diagnosis: Adjustment disorder with an anxious mood stat   Today the patient is at her baseline.  [name] her son who is a chronic severe mental illness has been released from prison about a month and a half ago.  He lives with the patient.  The patient insists that he takes his medicines and watches to do it.  He is apparently a very difficult person to manage.  The patient's other son [name] lives in [place] of all the time.  The patient says by the age of 43 short time from now she will want to make other plans for [name] including returning him back to the state for a group home.  The patient has found [facility] psychiatric care.  The patient goes to the gym almost every day.  She likes to swim.  She is involved with [employer].  She sleeps fairly well.  Her appetite is good and her energy is good.  She is able to think and concentrate.  She has no psychosis.  She uses no drugs or alcohol.  Patient recently started in therapy.  For a short period of time she was evaluated by a psychiatrist at [facility].  That no longer exists.  Patient is planning to be in therapy release to be started.  The patient's health is very good.  Patient Active Problem List   Diagnosis Date Noted   Infectious gastroenteritis [A09] 04/16/2016   Xerostomia [K11.7] 03/09/2016   Surgery, elective [Z41.9] 05/23/2015   S/P arthroscopy of shoulder [Z98.890] 05/23/2015   Chronic migraine without aura without status migrainosus, not intractable [G43.709] 10/18/2014   Tobacco abuse [Z72.0] 10/18/2014   Obesity [E66.9] 09/23/2014   COPD [J44.9] 09/02/2014   Pulmonary hypertension ([place]) [I27.20] 08/31/2014   Respiratory failure with hypoxia ([place]) [J96.91] 08/14/2014   Cigarette  smoker [F17.210] 07/28/2014   Major depressive disorder, recurrent episode, moderate (HCC) [F33.1] 07/06/2014   Essential hypertension [I10]    SOB (shortness of breath) [R06.02] 06/21/2014   Precordial pain [R07.2] 06/21/2014   Gastroesophageal reflux disease [K21.9] 06/21/2014   Chest pain [R07.9] 06/21/2014   HTN (hypertension) [I10]    Neck pain [M54.2] 01/08/2014   Major depressive disorder, recurrent episode, severe, without mention of psychotic behavior [F33.2] 10/14/2012   Schizoaffective disorder ([place]) [F25.9] 05/26/2012   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] 10/06/2010   Hyperparathyroidism, primary ([place]) [E21.0] 10/06/2010   DEGENERATIVE DISC DISEASE, LUMBOSACRAL SPINE [M51.37] 05/21/2007   DERMATOPHYTOSIS OF THE BODY [B35.4] 05/10/2007   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] 03/24/2007   Anxiety state [F41.1] 03/24/2007   DENTAL PAIN [K08.9] 03/24/2007   SHOULDER PAIN, LEFT [M25.519] 03/24/2007   Total Time spent with patient:30 min  Past Psychiatric History:   Past Medical History:  Past Medical History:  Diagnosis Date   Anginal pain ([place])    admit 06/2014; had non-ischemic stress test   Anxiety    Bipolar 1 disorder (HCC)    Colon polyp    CTS (carpal tunnel syndrome)    Depression    Diabetes mellitus without complication (HCC)    Fever blister    GERD (gastroesophageal reflux disease)    HA (headache)    HTN (hypertension)    Hypercholesterolemia    Migraines    OA (osteoarthritis)    Schizo-affective psychosis (  [facility])     Past Surgical History:  Procedure Laterality Date   ANTERIOR CERVICAL DECOMP/DISCECTOMY FUSION  08/27/2011   Procedure: ANTERIOR CERVICAL DECOMPRESSION/DISCECTOMY FUSION 1 LEVEL/HARDWARE REMOVAL;  Surgeon: Eustace Moore, MD;  Location: [place] NEURO ORS;  Service: Neurosurgery;  Laterality: Bilateral;  Cervical four-five Anterior cervical decompression/diskectomy, fusion, Plate, Removal of Cervical five-seven Plate   back injection     CARDIAC  CATHETERIZATION N/A 11/09/2014   Procedure: Right Heart Cath;  Surgeon: Larey Dresser, MD;  Location: [place] CV LAB;  Service: Cardiovascular;  Laterality: N/A;   CARPAL TUNNEL RELEASE  20110 rt/lt   rt x2 , lt x1   COLONOSCOPY  06/2017   [facility]   ESOPHAGOGASTRODUODENOSCOPY  06/2017   [facility]     MULTIPLE TOOTH EXTRACTIONS     NECK SURGERY  2009   PITUITARY SURGERY     Had gland removed from producing too much calcium   polp removed  2011   RIGHT/LEFT HEART CATH AND CORONARY ANGIOGRAPHY N/A 07/05/2017   Procedure: RIGHT/LEFT HEART CATH AND CORONARY ANGIOGRAPHY;  Surgeon: Larey Dresser, MD;  Location: [place] CV LAB;  Service: Cardiovascular;  Laterality: N/A;   SHOULDER ARTHROSCOPY WITH ROTATOR CUFF REPAIR Right 05/23/2015   Procedure: RIGHT SHOULDER ARTHROSCOPY WITH REMOVAL OF SUTURE ANCHOR AND POSSIBLE REVISION ROTATOR CUFF REPAIR;  Surgeon: Justice Britain, MD;  Location: [place];  Service: Orthopedics;  Laterality: Right;   SHOULDER ARTHROSCOPY WITH SUBACROMIAL DECOMPRESSION Right 01/24/2015   Procedure: RIGHT SHOULDER ARTHROSCOPY WITH SUBACROMIAL DECOMPRESSION AD DISTAL CLAVICLE RESECTION ;  Surgeon: Justice Britain, MD;  Location: [place];  Service: Orthopedics;  Laterality: Right;   VAGINAL DELIVERY     x3   Family History:  Family History  Problem Relation Age of Onset   Coronary artery disease Father    Cancer Father        head neck    Esophageal cancer Father    Hypertension Mother    Schizophrenia Mother    Diabetes Mother    Heart attack Mother    Depression Brother    Suicidality Brother    Prostate cancer Brother    Cancer Brother        bone marrow   Schizophrenia Maternal Grandmother    Anesthesia problems Neg Hx    Hypotension Neg Hx    Malignant hyperthermia Neg Hx    Pseudochol deficiency Neg Hx    Allergic rhinitis Neg Hx    Angioedema Neg Hx    Asthma Neg Hx    Atopy Neg Hx    Eczema Neg Hx     Immunodeficiency Neg Hx    Urticaria Neg Hx    Breast cancer Neg Hx    Family Psychiatric  History:  Social History:  Social History   Substance and Sexual Activity  Alcohol Use No   Alcohol/week: 0.0 standard drinks     Social History   Substance and Sexual Activity  Drug Use No   Comment: hx crack addiction 2008    Social History   Socioeconomic History   Marital status: Single    Spouse name: Not on file   Number of children: 3   Years of education: 12   Highest education level: Some college, no degree  Occupational History   Occupation: disabled/retired  Tobacco Use   Smoking status: Some Days    Packs/day: 0.10    Years: 30.00    Pack years: 3.00  Types: Cigarettes   Smokeless tobacco: Never  Vaping Use   Vaping Use: Some days  Substance and Sexual Activity   Alcohol use: No    Alcohol/week: 0.0 standard drinks   Drug use: No    Comment: hx crack addiction 2008   Sexual activity: Never  Other Topics Concern   Not on file  Social History Narrative   Lives in a two story home alone      [facility] [place] [facility]      Right handed   12th grade      Caffeine coffee 1 cup /day   Social Determinants of Health   Financial Resource Strain: Not on file  Food Insecurity: Not on file  Transportation Needs: Not on file  Physical Activity: Not on file  Stress: Not on file  Social Connections: Not on file   Additional Social History:                         Sleep: Good  Appetite:  Fair  Current Medications: Current Outpatient Medications  Medication Sig Dispense Refill   ASHWAGANDHA PO Take 1 capsule by mouth 2 (two) times daily.     azelastine (ASTELIN) 0.1 % nasal spray Place 1 spray into both nostrils 2 (two) times daily.     cetirizine (ZYRTEC ALLERGY) 10 MG tablet Take 1 tablet (10 mg total) by mouth daily. 90 tablet 0   cholecalciferol (VITAMIN D) 1000 UNITS tablet Take 1,000 Units by mouth daily.     clonazePAM  (KLONOPIN) 1 MG tablet Take 1 tablet qam & 1 tablet qhs 60 tablet 4   DEXILANT 30 MG capsule Take 1 capsule by mouth daily.     diclofenac Sodium (VOLTAREN) 1 % GEL Apply topically 4 (four) times daily.     diphenhydrAMINE (BENADRYL) 25 MG tablet 1 tablet as needed     Echinacea-Goldenseal LIQD Place 1 drop into the nose daily as needed.     Ginger, Zingiber officinalis, (GINGER EXTRACT PO) Take by mouth.     hydrocortisone (ANUSOL-HC) 2.5 % rectal cream PLACE 1 APPLICATION IN RECTUM 3 TIMES A DAY     hydrOXYzine (VISTARIL) 25 MG capsule Take 1 capsule (25 mg total) by mouth 2 (two) times daily as needed. 60 capsule 5   Levomilnacipran HCl ER (FETZIMA) 20 MG CP24 1  qam 30 capsule 3   lidocaine (LIDODERM) 5 % Place 1 patch onto the skin daily. Remove & Discard patch within 12 hours or as directed by MD 30 patch 0   Magnesium 500 MG TABS 1 tablet     mirtazapine (REMERON) 30 MG tablet Take 1 tablet (30 mg total) by mouth at bedtime. 30 tablet 4   NON FORMULARY 2 (two) times daily. [place]     Omega-3 Fatty Acids (FISH OIL PO) Take by mouth 2 (two) times a day.     ondansetron (ZOFRAN) 8 MG tablet Take 1 tablet (8 mg total) by mouth every 8 (eight) hours as needed for nausea or vomiting. 20 tablet 3   oxyCODONE-acetaminophen (PERCOCET) 10-325 MG tablet Take 1 tablet by mouth 2 (two) times daily.     potassium chloride SA (K-DUR,KLOR-CON) 20 MEQ tablet Take 20 mEq by mouth 2 (two) times daily.     promethazine-dextromethorphan (PROMETHAZINE-DM) 6.25-15 MG/5ML syrup 5 ml as needed     rosuvastatin (CRESTOR) 10 MG tablet 1 tablet     temazepam (RESTORIL) 15 MG capsule Take 1  capsule (15 mg total) by mouth at bedtime as needed for sleep. 30 capsule 4   topiramate (TOPAMAX) 100 MG tablet Take 1 tablet (100 mg total) by mouth 2 (two) times daily. 180 tablet 3   triamterene-hydrochlorothiazide (DYAZIDE) 50-25 MG capsule Take 1 capsule by mouth daily.     TURMERIC PO Take by mouth as needed.      Ubrogepant (UBRELVY) 50 MG TABS Take one tablet onset migraine, may repeat in 2 hours if needed (max 2 tabs/24 hours) 10 tablet 5   valACYclovir (VALTREX) 1000 MG tablet Take 1,000 mg by mouth daily.     vitamin B-12 (CYANOCOBALAMIN) 1000 MCG tablet Take 1,000 mcg by mouth daily.     No current facility-administered medications for this visit.    Lab Results: No results found for this or any previous visit (from the past 48 hour(s)).  Physical Findings: AIMS:  , ,  ,  ,    CIWA:    COWS:     Musculoskeletal: Strength & Muscle Tone: within normal limits Gait & Station: normal Patient leans: N/A  Psychiatric Specialty Exam: ROS  There were no vitals taken for this visit.There is no height or weight on file to calculate BMI.  General Appearance: Casual  Eye Contact::  Good  Speech:  Clear and Coherent  Volume:  Normal  Mood:  Euthymic  Affect:  Congruent  Thought Process:  Coherent  Orientation:  Full (Time, Place, and Person)  Thought Content:  WDL  Suicidal Thoughts:  No  Homicidal Thoughts:  No  Memory:  NA  Judgement:  Good  Insight:  Fair  Psychomotor Activity:  Normal  Concentration:  Fair  Recall:  Good  Fund of Knowledge:Good  Language: Good  Akathisia:  No  Handed:  Right  AIMS (if indicated):     Assets:   ADL's:  Intact  Cognition: WNL  Sleep:       Treatment Plan  04/16/2021, 3:41 PM    This patient's first problem is that of major depression.  She will continue taking Remeron 30 mg which seems to be very helpful.  Her second problem is insomnia.  The patient will continue taking Restoril 15 mg and she also takes hydroxyzine 25 mg 2 at night.  Her third problem is an ongoing adjustment disorder with an anxious mood state.  For now she will continue taking a fixed dose of Klonopin 1 mg twice daily which we will not change.  The patient stays active.  She is actually functioning well.

## 2021-04-17 ENCOUNTER — Ambulatory Visit (HOSPITAL_COMMUNITY): Payer: Medicaid Other | Admitting: Psychiatry

## 2021-04-22 ENCOUNTER — Ambulatory Visit (INDEPENDENT_AMBULATORY_CARE_PROVIDER_SITE_OTHER): Payer: Medicaid Other | Admitting: Licensed Clinical Social Worker

## 2021-04-22 ENCOUNTER — Other Ambulatory Visit: Payer: Self-pay

## 2021-04-22 DIAGNOSIS — F331 Major depressive disorder, recurrent, moderate: Secondary | ICD-10-CM

## 2021-04-22 NOTE — Progress Notes (Signed)
° °  THERAPIST PROGRESS NOTE  Session Time: 58 min  Participation Level: Active  Behavioral Response: CasualAlertDepressed  Type of Therapy: Individual Therapy  Treatment Goals addressed: Communication: dep/coping/grief  Interventions: Solution Focused, Supportive, and Other: grief edu/counseling  Summary: Gloria Lewis is a 66 y.o. female who presents with hx of MDD.  Today patient returns for in person session.  This is the first session since initial session on April 02, 2021.  Gloria Lewis provides updates on the situation with her son Gloria Lewis moving in with her.  She reports there have not been significant changes but he is accepting his shot from her on a scheduled basis.  She reports he does not have an act team.  [facility] is [facility] services of [facility] and [facility].  She continues to have struggles with his ability to monitor his own self-care, follow house rules and reports he is routinely using cannabis.  She provides details on the challenges created by his use of cannabis, her efforts to get him to stop and states she thinks she is going to "have to let it go".  Gloria Lewis states someone sent her a book titled: I am not sick I do not need help, which is giving her some good tips to deal with her son. Patient reports she is going to a [employer] soon.  She states her son will go stay with his father during this time and is expecting this to be a good respite for her.  LCSW provided information on family caregiver alliance which patient agrees to investigate.  LCSW spent remainder of session addressing loss and grief.  Patient has not had any formal grief counseling despite her multiple losses.  LCSW provided detailed education on the stages of grief, grief that comes with the loss of anything of real value.  LCSW provided literature on grief for patient to take with her.  LCSW introduced coping strategy related to letter writing.  Patient reports she has been journaling since  initial session and finds it helpful but is willing to consider letter writing for specific losses, particularly her daughter, Gloria Lewis, who was intentionally run over by a motor vehicle in 2018 and her brother's suicide in 2001.  Patient reports she is taking medications as prescribed.  She saw Dr. Casimiro Needle again and states he is saying he is reducing his schedule but not retiring at this time.  Patient states intent to continue to see him as long as she is able to for medication management. LCSW encouraged positive self care and distractions, controlled worry. Pt continues to exercise/swim regularly. Her faith is paramount to her coping. LCSW reviewed poc including scheduling prior to close of session. Pt states appreciation for care.     Suicidal/Homicidal: Nowithout intent/plan  Therapist Response: Pt receptive to care.  Plan: Return again in ~5 weeks.  Diagnosis: Axis I:  MDD, moderate  [name], LCSW 04/22/2021

## 2021-05-12 ENCOUNTER — Ambulatory Visit: Payer: Medicaid Other

## 2021-05-14 ENCOUNTER — Other Ambulatory Visit: Payer: Self-pay | Admitting: Family Medicine

## 2021-05-14 ENCOUNTER — Other Ambulatory Visit: Payer: Self-pay

## 2021-05-14 ENCOUNTER — Ambulatory Visit
Admission: RE | Admit: 2021-05-14 | Discharge: 2021-05-14 | Disposition: A | Discharge status: Home / Self Care | Payer: Medicaid Other | Source: Ambulatory Visit | Attending: Family Medicine | Admitting: Family Medicine

## 2021-05-14 DIAGNOSIS — Z1231 Encounter for screening mammogram for malignant neoplasm of breast: Secondary | ICD-10-CM

## 2021-05-29 NOTE — Patient Instructions (Incomplete)

## 2021-05-29 NOTE — Progress Notes (Unsigned)
No chief complaint on file.    HISTORY OF PRESENT ILLNESS: 05/29/21 ALL: Gloria Lewis returns for follow up for migraines with aura. She continues topiramate 100mg  BID and Ubrelvy PRN. She is no longer on amitriptyline or gabapentin. She continues close follow up with Dr Casimiro Needle.   11/28/2020 ALL:  Gloria Lewis returns for follow up for migraines with aura. She continues topiramate 100mg  BID. Review of last rx date shows 11/25/2019 for 60 tablets wit6 refills. She is adamant that she takes this twice daily. Roselyn Meier works for abortive therapy. therapy that works well. She reports nortriptyline 50mg  was discontinued by previous psychiatrist but recently restarted by new psychiatrist. She tolerated it well in the past. Gabapentin was recently added for anxiety and restless legs by new psychiatrist. She has been afraid to start this medication. She is having about 20 headache days, 3 migraines. Roselyn Meier works well for abortive therapy. She reports being seen regularly by PCP.   02/14/2020 ALL:  Gloria Lewis is a 66 y.o. female here today for follow up for migraine with aura. She was restarted on topiramate 50mg  BID and Nurtec PRN. Dose was increased to 100mg  tiwice daily. She has continued nortriptyline 50mg  at bedtime prescribed by psychiatry. She is also taking Valium 5mg  BID and Remeron 30mg  daily for mood management. She feels that headaches wax and wane. She reports having 22 headache days a month. She has tried Nurtec about 6 times and felt it worsened Acid Reflux. It was effective in abortion therapy. She is taking Dexilant 30mg  daily for GERD.    MRI showed artery tortuosity most likely from HTN and small vessel disease. On Dyazide and fish oil, no statin. She has talked to PCP about having MRA/CTA but has decided to wait at this time. She has been working on diet changes over the past 3 years. She has lost about 60 pounds. She reports huge amounts of stress. She lives in an apartment complex. She is  having a hard time with her landlord. She is not sleeping well. She tries to sleep when her neighbors are awake to avoid conflicts. She does not have any other place she can live. [place] [address]. She feels that this contributes to her headaches. She is seeing psychiatrist every 3 months. She is walking at least 5 days a week. She does not snore. No daytime sleepiness.   HISTORY (copied from Dr Gladstone Lighter note on 08/14/2019)  66 year old female here for evaluation of headaches. Patient has had headaches since childhood with migraine features, throbbing severe headaches with nausea and sensitivity to light. Sometimes she sees visual disturbance before her headache starts like "raining" pattern. Patient averages three headaches per week. Triggers include stress, allergies and pain. She reports family history of brain aneurysm. Patient had several falls and injuries when she was younger where she hit her head on the ground.   Currently patient is struggling with right rotator cuff pain on pain medication. She does exercise five times per week. She drinks 1 cup of tea per day. Patient has remote history of substance abuse more than 25 years ago, currently abstinent. She does smoke cigarettes and is trying to quit. No alcohol use.   Patient is tried some migraine medications including topiramate, Imitrex, Maxalt, Ajovy, muscle relaxers, narcotics, benzodiazepines, trigger point injections without relief. In the past Topamax seem to work the best without any side effects.  REVIEW OF SYSTEMS: Out of a complete 14 system review of symptoms, the patient complains only  of the following symptoms, headaches, anxiety, chronic pain and all other reviewed systems are negative.   ALLERGIES: Allergies  Allergen Reactions   Effexor [Venlafaxine Hydrochloride] Itching and Other (See Comments)    headache   Latex Itching and Rash    Other reaction(s): Unknown   Penicillins Hives    Has patient had a PCN  reaction causing immediate rash, facial/tongue/throat swelling, SOB or lightheadedness with hypotension: Yes Has patient had a PCN reaction causing severe rash involving mucus membranes or skin necrosis: No Has patient had a PCN reaction that required hospitalization No Has patient had a PCN reaction occurring within the last 10 years: No If all of the above answers are "NO", then may proceed with Cephalosporin use.    Zithromax [Azithromycin Dihydrate] Swelling   Amlodipine Swelling   Atorvastatin Swelling    Muscle aches   Butrans [Buprenorphine] Other (See Comments)    Ulcers-"mouth would not heal"   Codeine Itching    Tolerable with benadryl   Nucynta [Tapentadol] Other (See Comments)    Ulcers inside of mouth   Nurtec [Rimegepant Sulfate] Nausea Only   Other     Other reaction(s): nausea Other reaction(s): itching Other reaction(s): itch Other reaction(s): HA Other reaction(s): Unknown   Pravastatin Sodium     Other reaction(s): itching   Propranolol Hcl     Other reaction(s): dyspnea   Sumatriptan     Other reaction(s): Unknown   Venlafaxine     Other reaction(s): HA   Vicodin [Hydrocodone-Acetaminophen] Itching   Amoxicillin Itching    Other reaction(s): itching   Chantix [Varenicline Tartrate] Nausea Only   Hydrocodone Itching    Tolerable with benadryl Other reaction(s): itch   Paroxetine Hcl Other (See Comments)    headache   Tramadol Other (See Comments)    Pt states it interacted with her sertraline, but she is no longer on sertraline.  She does not remember the type of reaction she had.  Other reaction(s): Fainted     HOME MEDICATIONS: Outpatient Medications Prior to Visit  Medication Sig Dispense Refill   ASHWAGANDHA PO Take 1 capsule by mouth 2 (two) times daily.     azelastine (ASTELIN) 0.1 % nasal spray Place 1 spray into both nostrils 2 (two) times daily.     cetirizine (ZYRTEC ALLERGY) 10 MG tablet Take 1 tablet (10 mg total) by mouth daily. 90  tablet 0   cholecalciferol (VITAMIN D) 1000 UNITS tablet Take 1,000 Units by mouth daily.     clonazePAM (KLONOPIN) 1 MG tablet Take 1 tablet qam & 1 tablet qhs 60 tablet 4   DEXILANT 30 MG capsule Take 1 capsule by mouth daily.     diclofenac Sodium (VOLTAREN) 1 % GEL Apply topically 4 (four) times daily.     diphenhydrAMINE (BENADRYL) 25 MG tablet 1 tablet as needed     Echinacea-Goldenseal LIQD Place 1 drop into the nose daily as needed.     Ginger, Zingiber officinalis, (GINGER EXTRACT PO) Take by mouth.     hydrocortisone (ANUSOL-HC) 2.5 % rectal cream PLACE 1 APPLICATION IN RECTUM 3 TIMES A DAY     hydrOXYzine (VISTARIL) 25 MG capsule Take 1 capsule (25 mg total) by mouth 2 (two) times daily as needed. 60 capsule 5   Levomilnacipran HCl ER (FETZIMA) 20 MG CP24 1  qam 30 capsule 3   lidocaine (LIDODERM) 5 % Place 1 patch onto the skin daily. Remove & Discard patch within 12 hours or as directed by MD 30  patch 0   Magnesium 500 MG TABS 1 tablet     mirtazapine (REMERON) 30 MG tablet Take 1 tablet (30 mg total) by mouth at bedtime. 30 tablet 4   NON FORMULARY 2 (two) times daily. [place]     Omega-3 Fatty Acids (FISH OIL PO) Take by mouth 2 (two) times a day.     ondansetron (ZOFRAN) 8 MG tablet Take 1 tablet (8 mg total) by mouth every 8 (eight) hours as needed for nausea or vomiting. 20 tablet 3   oxyCODONE-acetaminophen (PERCOCET) 10-325 MG tablet Take 1 tablet by mouth 2 (two) times daily.     potassium chloride SA (K-DUR,KLOR-CON) 20 MEQ tablet Take 20 mEq by mouth 2 (two) times daily.     promethazine-dextromethorphan (PROMETHAZINE-DM) 6.25-15 MG/5ML syrup 5 ml as needed     rosuvastatin (CRESTOR) 10 MG tablet 1 tablet     temazepam (RESTORIL) 15 MG capsule Take 1 capsule (15 mg total) by mouth at bedtime as needed for sleep. 30 capsule 4   topiramate (TOPAMAX) 100 MG tablet Take 1 tablet (100 mg total) by mouth 2 (two) times daily. 180 tablet 3   triamterene-hydrochlorothiazide  (DYAZIDE) 50-25 MG capsule Take 1 capsule by mouth daily.     TURMERIC PO Take by mouth as needed.     Ubrogepant (UBRELVY) 50 MG TABS Take one tablet onset migraine, may repeat in 2 hours if needed (max 2 tabs/24 hours) 10 tablet 5   valACYclovir (VALTREX) 1000 MG tablet Take 1,000 mg by mouth daily.     vitamin B-12 (CYANOCOBALAMIN) 1000 MCG tablet Take 1,000 mcg by mouth daily.     No facility-administered medications prior to visit.     PAST MEDICAL HISTORY: Past Medical History:  Diagnosis Date   Anginal pain ([place])    admit 06/2014; had non-ischemic stress test   Anxiety    Bipolar 1 disorder ([facility])    Colon polyp    CTS (carpal tunnel syndrome)    Depression    Diabetes mellitus without complication ([facility])    Fever blister    GERD (gastroesophageal reflux disease)    HA (headache)    HTN (hypertension)    Hypercholesterolemia    Migraines    OA (osteoarthritis)    Schizo-affective psychosis ([place])      PAST SURGICAL HISTORY: Past Surgical History:  Procedure Laterality Date   ANTERIOR CERVICAL DECOMP/DISCECTOMY FUSION  08/27/2011   Procedure: ANTERIOR CERVICAL DECOMPRESSION/DISCECTOMY FUSION 1 LEVEL/HARDWARE REMOVAL;  Surgeon: Eustace Moore, MD;  Location: [facility];  Service: Neurosurgery;  Laterality: Bilateral;  Cervical four-five Anterior cervical decompression/diskectomy, fusion, Plate, Removal of Cervical five-seven Plate   back injection     CARDIAC CATHETERIZATION N/A 11/09/2014   Procedure: Right Heart Cath;  Surgeon: Larey Dresser, MD;  Location: [facility];  Service: Cardiovascular;  Laterality: N/A;   CARPAL TUNNEL RELEASE  20110 rt/lt   rt x2 , lt x1   COLONOSCOPY  06/2017   [facility]   ESOPHAGOGASTRODUODENOSCOPY  06/2017   [facility]     MULTIPLE TOOTH EXTRACTIONS     NECK SURGERY  2009   PITUITARY SURGERY     Had gland removed from producing too much calcium   polp removed  2011   RIGHT/LEFT  HEART CATH AND CORONARY ANGIOGRAPHY N/A 07/05/2017   Procedure: RIGHT/LEFT HEART CATH AND CORONARY ANGIOGRAPHY;  Surgeon: Larey Dresser, MD;  Location: [facility];  Service:  Cardiovascular;  Laterality: N/A;   SHOULDER ARTHROSCOPY WITH ROTATOR CUFF REPAIR Right 05/23/2015   Procedure: RIGHT SHOULDER ARTHROSCOPY WITH REMOVAL OF SUTURE ANCHOR AND POSSIBLE REVISION ROTATOR CUFF REPAIR;  Surgeon: Justice Britain, MD;  Location: [facility];  Service: Orthopedics;  Laterality: Right;   SHOULDER ARTHROSCOPY WITH SUBACROMIAL DECOMPRESSION Right 01/24/2015   Procedure: RIGHT SHOULDER ARTHROSCOPY WITH SUBACROMIAL DECOMPRESSION AD DISTAL CLAVICLE RESECTION ;  Surgeon: Justice Britain, MD;  Location: [facility];  Service: Orthopedics;  Laterality: Right;   VAGINAL DELIVERY     x3     FAMILY HISTORY: Family History  Problem Relation Age of Onset   Coronary artery disease Father    Cancer Father        head neck    Esophageal cancer Father    Hypertension Mother    Schizophrenia Mother    Diabetes Mother    Heart attack Mother    Depression Brother    Suicidality Brother    Prostate cancer Brother    Cancer Brother        bone marrow   Schizophrenia Maternal Grandmother    Anesthesia problems Neg Hx    Hypotension Neg Hx    Malignant hyperthermia Neg Hx    Pseudochol deficiency Neg Hx    Allergic rhinitis Neg Hx    Angioedema Neg Hx    Asthma Neg Hx    Atopy Neg Hx    Eczema Neg Hx    Immunodeficiency Neg Hx    Urticaria Neg Hx    Breast cancer Neg Hx      SOCIAL HISTORY: Social History   Socioeconomic History   Marital status: Single    Spouse name: Not on file   Number of children: 3   Years of education: 12   Highest education level: Some college, no degree  Occupational History   Occupation: disabled/retired  Tobacco Use   Smoking status: Some Days    Packs/day: 0.10    Years: 30.00    Pack years: 3.00    Types: Cigarettes   Smokeless tobacco: Never  Vaping Use   Vaping  Use: Some days  Substance and Sexual Activity   Alcohol use: No    Alcohol/week: 0.0 standard drinks   Drug use: No    Comment: hx crack addiction 2008   Sexual activity: Never  Other Topics Concern   Not on file  Social History Narrative   Lives in a two story home alone      [facility] in [place] for pain management      Right handed   12th grade      Caffeine coffee 1 cup /day   Social Determinants of Health   Financial Resource Strain: Not on file  Food Insecurity: Not on file  Transportation Needs: Not on file  Physical Activity: Not on file  Stress: Not on file  Social Connections: Not on file  Intimate Partner Violence: Not on file      PHYSICAL EXAM  There were no vitals filed for this visit.   There is no height or weight on file to calculate BMI.   Generalized: Well developed, in no acute distress   Cardiology: Normal rate and rhythm, no murmur auscultated Respiratory: Clear to auscultation bilaterally  Neurological examination  Mentation: Alert oriented to time, place, history taking. Follows all commands speech and language fluent Cranial nerve II-XII: Pupils were equal round reactive to light. Extraocular movements were full, visual field were full  Motor: The motor testing  reveals 5 over 5 strength of all 4 extremities. Good symmetric motor tone is noted throughout.  Gait and station: Gait is normal.     DIAGNOSTIC DATA (LABS, IMAGING, TESTING) - I reviewed patient records, labs, notes, testing and imaging myself where available.  Lab Results  Component Value Date   WBC 7.0 09/15/2019   HGB 14.1 09/15/2019   HCT 42.6 09/15/2019   MCV 86.2 09/15/2019   PLT 381 09/15/2019      Component Value Date/Time   NA 139 09/15/2019 1256   K 3.7 09/15/2019 1256   CL 101 09/15/2019 1256   CO2 27 09/15/2019 1256   GLUCOSE 97 09/15/2019 1256   BUN 19 09/15/2019 1256   CREATININE 0.97 09/15/2019 1256   CALCIUM 9.6 09/15/2019 1256   PROT 6.8  11/06/2017 1147   ALBUMIN 3.7 11/06/2017 1147   AST 32 11/06/2017 1147   ALT 20 11/06/2017 1147   ALKPHOS 104 11/06/2017 1147   BILITOT 0.7 11/06/2017 1147   GFRNONAA >60 09/15/2019 1256   GFRAA >60 09/15/2019 1256   Lab Results  Component Value Date   CHOL 176 08/11/2017   HDL 43 08/11/2017   LDLCALC 95 08/11/2017   TRIG 189 (H) 08/11/2017   CHOLHDL 4.1 08/11/2017   Lab Results  Component Value Date   HGBA1C 6.2 (H) 08/11/2017   Lab Results  Component Value Date   VITAMINB12 473 08/11/2017   Lab Results  Component Value Date   TSH 1.015 08/11/2017      ASSESSMENT AND PLAN  66 y.o. year old female  has a past medical history of Anginal pain ([place]), Anxiety, Bipolar 1 disorder ([place]), Colon polyp, CTS (carpal tunnel syndrome), Depression, Diabetes mellitus without complication ([place]), Fever blister, GERD (gastroesophageal reflux disease), HA (headache), HTN (hypertension), Hypercholesterolemia, Migraines, OA (osteoarthritis), and Schizo-affective psychosis ([place]). here with   No diagnosis found.  Gloria Lewis continues to have regular headache days but has been off nortriptyline. New psychiatrist restarted nortriptyline 50mg  QHS and started gabapentin 300mg  daily. I have encouraged her to start these medications as directed as they could be helpful in migraine management as well. She will continue topiramate 100mg  BID and Ubrelvy as needed. She will continue to follow-up closely with primary care.  Consider imaging as indicated per PCP. No obvious concerns of aneurysm on last MRI or given in history. Healthy lifestyle habits encouraged.  Continue close follow-up with psychiatry.  Stress management discussed.  She will follow up with [place] in 6 months.    Debbora Presto, MSN, FNP-C 05/29/2021, 4:26 PM  [facility] [address] [place], [place] [address] [phone]

## 2021-06-02 ENCOUNTER — Encounter: Payer: Self-pay | Admitting: Family Medicine

## 2021-06-02 ENCOUNTER — Ambulatory Visit: Payer: Medicaid Other | Admitting: Family Medicine

## 2021-06-02 DIAGNOSIS — G43109 Migraine with aura, not intractable, without status migrainosus: Secondary | ICD-10-CM

## 2021-06-04 ENCOUNTER — Telehealth: Payer: Self-pay | Admitting: Family Medicine

## 2021-06-04 ENCOUNTER — Ambulatory Visit: Payer: Medicaid Other | Admitting: Family Medicine

## 2021-06-04 ENCOUNTER — Encounter: Payer: Self-pay | Admitting: Family Medicine

## 2021-06-04 VITALS — BP 153/82 | HR 79 | Ht 69.0 in | Wt 232.0 lb

## 2021-06-04 DIAGNOSIS — G43109 Migraine with aura, not intractable, without status migrainosus: Secondary | ICD-10-CM | POA: Diagnosis not present

## 2021-06-04 MED ORDER — UBRELVY 50 MG PO TABS: ORAL_TABLET | ORAL | 11 refills | Status: DC

## 2021-06-04 MED ORDER — TOPIRAMATE 100 MG PO TABS: 100.0000 mg | ORAL_TABLET | Freq: Two times a day (BID) | ORAL | 3 refills | Status: DC

## 2021-06-04 NOTE — Telephone Encounter (Signed)
Called pt. Advised PA approved, she should now be able to pick up from pharmacy. She verbalized understanding.  ?

## 2021-06-04 NOTE — Patient Instructions (Signed)
Below is our plan: ? ?We will continue with the Topiramate 100 mg twice daily and Ubrelvy as needed for abortive therapy. ? ?Please make sure you are staying well hydrated. I recommend 50-60 ounces daily. Well balanced diet and regular exercise encouraged. Consistent sleep schedule with 6-8 hours recommended.  ? ?Please continue follow up with care team as directed.  ? ?Follow up with me in 1 year, sooner if needed. ? ?You may receive a survey regarding today's visit. I encourage you to leave honest feed back as I do use this information to improve patient care. Thank you for seeing me today!  ?  ?

## 2021-06-04 NOTE — Telephone Encounter (Signed)
Pt called stating that she was informed by her pharmacy that she is needing a PA for her Roselyn Meier. Please advise. ?

## 2021-06-04 NOTE — Progress Notes (Signed)
Chief Complaint  Patient presents with   Follow-up    RM 17, alone. Last seen 11/28/20.      HISTORY OF PRESENT ILLNESS:  06/04/21 ALL:  Gloria Lewis is a 66 y.o. female here today for follow up for  migraines. She continues Topiramate 100mg  twice daily and Ubrelvy as needed. Her psychiatrist had started her on gabapentin and nortriptyline. She endorses that she is taking the nortriptyline at night, but not taking the gabapentin. At this time she endorses having less than 5 headache days a month. She reports that she is working out 4x/week at [employer] with her son.  She is sleeping better. Her BP is elevated today but she reports that her numbers are better at home an she is taking her blood pressure medications regularly.    HISTORY (copied from previous note) 11/28/20 ALL: Gloria Lewis returns for follow up for migraines with aura. She continues topiramate 100mg  BID. Review of last rx date shows 11/25/2019 for 60 tablets wit6 refills. She is adamant that she takes this twice daily. Roselyn Meier works for abortive therapy. therapy that works well. She reports nortriptyline 50mg  was discontinued by previous psychiatrist but recently restarted by new psychiatrist. She tolerated it well in the past. Gabapentin was recently added for anxiety and restless legs by new psychiatrist. She has been afraid to start this medication. She is having about 20 headache days, 3 migraines. Roselyn Meier works well for abortive therapy. She reports being seen regularly by PCP.    02/14/2020 ALL:  Gloria Lewis is a 66 y.o. female here today for follow up for migraine with aura. She was restarted on topiramate 50mg  BID and Nurtec PRN. Dose was increased to 100mg  tiwice daily. She has continued nortriptyline 50mg  at bedtime prescribed by psychiatry. She is also taking Valium 5mg  BID and Remeron 30mg  daily for mood management. She feels that headaches wax and wane. She reports having 22 headache days a month. She has tried  Nurtec about 6 times and felt it worsened Acid Reflux. It was effective in abortion therapy. She is taking Dexilant 30mg  daily for GERD.     MRI showed artery tortuosity most likely from HTN and small vessel disease. On Dyazide and fish oil, no statin. She has talked to PCP about having MRA/CTA but has decided to wait at this time. She has been working on diet changes over the past 3 years. She has lost about 60 pounds. She reports huge amounts of stress. She lives in an apartment complex. She is having a hard time with her landlord. She is not sleeping well. She tries to sleep when her neighbors are awake to avoid conflicts. She does not have any other place she can live. [place] [address]. She feels that this contributes to her headaches. She is seeing psychiatrist every 3 months. She is walking at least 5 days a week. She does not snore. No daytime sleepiness.     REVIEW OF SYSTEMS: Out of a complete 14 system review of symptoms, the patient complains only of the following symptoms, headaches and all other reviewed systems are negative.   ALLERGIES: Allergies  Allergen Reactions   Effexor [Venlafaxine Hydrochloride] Itching and Other (See Comments)    headache   Latex Itching and Rash    Other reaction(s): Unknown   Penicillins Hives    Has patient had a PCN reaction causing immediate rash, facial/tongue/throat swelling, SOB or lightheadedness with hypotension: Yes Has patient had a PCN reaction  causing severe rash involving mucus membranes or skin necrosis: No Has patient had a PCN reaction that required hospitalization No Has patient had a PCN reaction occurring within the last 10 years: No If all of the above answers are "NO", then may proceed with Cephalosporin use.    Zithromax [Azithromycin Dihydrate] Swelling   Amlodipine Swelling   Atorvastatin Swelling    Muscle aches   Butrans [Buprenorphine] Other (See Comments)    Ulcers-"mouth would not heal"   Codeine Itching     Tolerable with benadryl   Nucynta [Tapentadol] Other (See Comments)    Ulcers inside of mouth   Nurtec [Rimegepant Sulfate] Nausea Only   Other     Other reaction(s): nausea Other reaction(s): itching Other reaction(s): itch Other reaction(s): HA Other reaction(s): Unknown   Pravastatin Sodium     Other reaction(s): itching   Propranolol Hcl     Other reaction(s): dyspnea   Sumatriptan     Other reaction(s): Unknown   Venlafaxine     Other reaction(s): HA   Vicodin [Hydrocodone-Acetaminophen] Itching   Amoxicillin Itching    Other reaction(s): itching   Chantix [Varenicline Tartrate] Nausea Only   Hydrocodone Itching    Tolerable with benadryl Other reaction(s): itch   Paroxetine Hcl Other (See Comments)    headache   Tramadol Other (See Comments)    Pt states it interacted with her sertraline, but she is no longer on sertraline.  She does not remember the type of reaction she had.  Other reaction(s): Fainted     HOME MEDICATIONS: Outpatient Medications Prior to Visit  Medication Sig Dispense Refill   ASHWAGANDHA PO Take 1 capsule by mouth 2 (two) times daily.     azelastine (ASTELIN) 0.1 % nasal spray Place 1 spray into both nostrils 2 (two) times daily.     cetirizine (ZYRTEC ALLERGY) 10 MG tablet Take 1 tablet (10 mg total) by mouth daily. 90 tablet 0   cholecalciferol (VITAMIN D) 1000 UNITS tablet Take 1,000 Units by mouth daily.     clonazePAM (KLONOPIN) 1 MG tablet Take 1 tablet qam & 1 tablet qhs 60 tablet 4   DEXILANT 30 MG capsule Take 1 capsule by mouth daily.     diphenhydrAMINE (BENADRYL) 25 MG tablet 1 tablet as needed     Echinacea-Goldenseal LIQD Place 1 drop into the nose daily as needed.     Ginger, Zingiber officinalis, (GINGER EXTRACT PO) Take by mouth.     hydrocortisone (ANUSOL-HC) 2.5 % rectal cream PLACE 1 APPLICATION IN RECTUM 3 TIMES A DAY     hydrOXYzine (VISTARIL) 25 MG capsule Take 1 capsule (25 mg total) by mouth 2 (two) times daily as  needed. 60 capsule 5   lidocaine (LIDODERM) 5 % Place 1 patch onto the skin daily. Remove & Discard patch within 12 hours or as directed by MD 30 patch 0   Magnesium 500 MG TABS 1 tablet     mirtazapine (REMERON) 30 MG tablet Take 1 tablet (30 mg total) by mouth at bedtime. 30 tablet 4   NON FORMULARY 2 (two) times daily. [place]     Omega-3 Fatty Acids (FISH OIL PO) Take by mouth 2 (two) times a day.     ondansetron (ZOFRAN) 8 MG tablet Take 1 tablet (8 mg total) by mouth every 8 (eight) hours as needed for nausea or vomiting. 20 tablet 3   oxyCODONE-acetaminophen (PERCOCET) 10-325 MG tablet Take 1 tablet by mouth 2 (two) times daily.  potassium chloride SA (K-DUR,KLOR-CON) 20 MEQ tablet Take 20 mEq by mouth 2 (two) times daily.     promethazine-dextromethorphan (PROMETHAZINE-DM) 6.25-15 MG/5ML syrup 5 ml as needed     rosuvastatin (CRESTOR) 10 MG tablet 1 tablet     temazepam (RESTORIL) 15 MG capsule Take 1 capsule (15 mg total) by mouth at bedtime as needed for sleep. 30 capsule 4   triamterene-hydrochlorothiazide (DYAZIDE) 50-25 MG capsule Take 1 capsule by mouth daily.     TURMERIC PO Take by mouth as needed.     valACYclovir (VALTREX) 1000 MG tablet Take 1,000 mg by mouth daily.     vitamin B-12 (CYANOCOBALAMIN) 1000 MCG tablet Take 1,000 mcg by mouth daily.     Levomilnacipran HCl ER (FETZIMA) 20 MG CP24 1  qam 30 capsule 3   topiramate (TOPAMAX) 100 MG tablet Take 1 tablet (100 mg total) by mouth 2 (two) times daily. 180 tablet 3   Ubrogepant (UBRELVY) 50 MG TABS Take one tablet onset migraine, may repeat in 2 hours if needed (max 2 tabs/24 hours) 10 tablet 5   diclofenac Sodium (VOLTAREN) 1 % GEL Apply topically 4 (four) times daily. (Patient not taking: Reported on 06/04/2021)     No facility-administered medications prior to visit.     PAST MEDICAL HISTORY: Past Medical History:  Diagnosis Date   Anginal pain ([place])    admit 06/2014; had non-ischemic stress test   Anxiety     Bipolar 1 disorder (HCC)    Colon polyp    CTS (carpal tunnel syndrome)    Depression    Diabetes mellitus without complication (HCC)    Fever blister    GERD (gastroesophageal reflux disease)    HA (headache)    HTN (hypertension)    Hypercholesterolemia    Migraines    OA (osteoarthritis)    Schizo-affective psychosis ([place])      PAST SURGICAL HISTORY: Past Surgical History:  Procedure Laterality Date   ANTERIOR CERVICAL DECOMP/DISCECTOMY FUSION  08/27/2011   Procedure: ANTERIOR CERVICAL DECOMPRESSION/DISCECTOMY FUSION 1 LEVEL/HARDWARE REMOVAL;  Surgeon: Eustace Moore, MD;  Location: [facility];  Service: Neurosurgery;  Laterality: Bilateral;  Cervical four-five Anterior cervical decompression/diskectomy, fusion, Plate, Removal of Cervical five-seven Plate   back injection     CARDIAC CATHETERIZATION N/A 11/09/2014   Procedure: Right Heart Cath;  Surgeon: Larey Dresser, MD;  Location: [facility];  Service: Cardiovascular;  Laterality: N/A;   CARPAL TUNNEL RELEASE  20110 rt/lt   rt x2 , lt x1   COLONOSCOPY  06/2017   [facility]   ESOPHAGOGASTRODUODENOSCOPY  06/2017   [facility]     MULTIPLE TOOTH EXTRACTIONS     NECK SURGERY  2009   PITUITARY SURGERY     Had gland removed from producing too much calcium   polp removed  2011   RIGHT/LEFT HEART CATH AND CORONARY ANGIOGRAPHY N/A 07/05/2017   Procedure: RIGHT/LEFT HEART CATH AND CORONARY ANGIOGRAPHY;  Surgeon: Larey Dresser, MD;  Location: [facility];  Service: Cardiovascular;  Laterality: N/A;   SHOULDER ARTHROSCOPY WITH ROTATOR CUFF REPAIR Right 05/23/2015   Procedure: RIGHT SHOULDER ARTHROSCOPY WITH REMOVAL OF SUTURE ANCHOR AND POSSIBLE REVISION ROTATOR CUFF REPAIR;  Surgeon: Justice Britain, MD;  Location: [facility];  Service: Orthopedics;  Laterality: Right;   SHOULDER ARTHROSCOPY WITH SUBACROMIAL DECOMPRESSION Right 01/24/2015   Procedure: RIGHT SHOULDER ARTHROSCOPY  WITH SUBACROMIAL DECOMPRESSION AD DISTAL CLAVICLE RESECTION ;  Surgeon: Lennette Bihari  Supple, MD;  Location: [facility];  Service: Orthopedics;  Laterality: Right;   VAGINAL DELIVERY     x3     FAMILY HISTORY: Family History  Problem Relation Age of Onset   Coronary artery disease Father    Cancer Father        head neck    Esophageal cancer Father    Hypertension Mother    Schizophrenia Mother    Diabetes Mother    Heart attack Mother    Depression Brother    Suicidality Brother    Prostate cancer Brother    Cancer Brother        bone marrow   Schizophrenia Maternal Grandmother    Anesthesia problems Neg Hx    Hypotension Neg Hx    Malignant hyperthermia Neg Hx    Pseudochol deficiency Neg Hx    Allergic rhinitis Neg Hx    Angioedema Neg Hx    Asthma Neg Hx    Atopy Neg Hx    Eczema Neg Hx    Immunodeficiency Neg Hx    Urticaria Neg Hx    Breast cancer Neg Hx      SOCIAL HISTORY: Social History   Socioeconomic History   Marital status: Single    Spouse name: Not on file   Number of children: 3   Years of education: 12   Highest education level: Some college, no degree  Occupational History   Occupation: disabled/retired  Tobacco Use   Smoking status: Some Days    Packs/day: 0.10    Years: 30.00    Pack years: 3.00    Types: Cigarettes   Smokeless tobacco: Never  Vaping Use   Vaping Use: Some days  Substance and Sexual Activity   Alcohol use: No    Alcohol/week: 0.0 standard drinks   Drug use: No    Comment: hx crack addiction 2008   Sexual activity: Never  Other Topics Concern   Not on file  Social History Narrative   Lives in a two story home alone      [facility] in [place] for pain management      Right handed   12th grade      Caffeine coffee 1 cup /day   Social Determinants of Health   Financial Resource Strain: Not on file  Food Insecurity: Not on file  Transportation Needs: Not on file  Physical Activity: Not on file  Stress: Not on file   Social Connections: Not on file  Intimate Partner Violence: Not on file     PHYSICAL EXAM  Vitals:   06/04/21 0829  BP: (!) 153/82  Pulse: 79  Weight: 232 lb (105.2 kg)  Height: 5\' 9"  (1.753 m)   Body mass index is 34.26 kg/m.  Generalized: Well developed, in no acute distress  Cardiology: normal rate and rhythm, no murmur auscultated  Respiratory: clear to auscultation bilaterally    Neurological examination  Mentation: Alert oriented to time, place, history taking. Follows all commands speech and language fluent Cranial nerve II-XII: Pupils were equal round reactive to light. Extraocular movements were full, visual field were full on confrontational test. Facial sensation and strength were normal. Uvula tongue midline. Head turning and shoulder shrug  were normal and symmetric. Motor: The motor testing reveals 5 over 5 strength of all 4 extremities. Good symmetric motor tone is noted throughout.  Sensory: Sensory testing is intact to soft touch on all 4 extremities. No evidence of extinction is noted.  Coordination: Cerebellar testing reveals  good finger-nose-finger and heel-to-shin bilaterally.  Gait and station: Gait is normal.  Reflexes: Deep tendon reflexes are symmetric and normal bilaterally.    DIAGNOSTIC DATA (LABS, IMAGING, TESTING) - I reviewed patient records, labs, notes, testing and imaging myself where available.  Lab Results  Component Value Date   WBC 7.0 09/15/2019   HGB 14.1 09/15/2019   HCT 42.6 09/15/2019   MCV 86.2 09/15/2019   PLT 381 09/15/2019      Component Value Date/Time   NA 139 09/15/2019 1256   K 3.7 09/15/2019 1256   CL 101 09/15/2019 1256   CO2 27 09/15/2019 1256   GLUCOSE 97 09/15/2019 1256   BUN 19 09/15/2019 1256   CREATININE 0.97 09/15/2019 1256   CALCIUM 9.6 09/15/2019 1256   PROT 6.8 11/06/2017 1147   ALBUMIN 3.7 11/06/2017 1147   AST 32 11/06/2017 1147   ALT 20 11/06/2017 1147   ALKPHOS 104 11/06/2017 1147   BILITOT  0.7 11/06/2017 1147   GFRNONAA >60 09/15/2019 1256   GFRAA >60 09/15/2019 1256   Lab Results  Component Value Date   CHOL 176 08/11/2017   HDL 43 08/11/2017   LDLCALC 95 08/11/2017   TRIG 189 (H) 08/11/2017   CHOLHDL 4.1 08/11/2017   Lab Results  Component Value Date   HGBA1C 6.2 (H) 08/11/2017   Lab Results  Component Value Date   VITAMINB12 473 08/11/2017   Lab Results  Component Value Date   TSH 1.015 08/11/2017    No flowsheet data found.   No flowsheet data found.   ASSESSMENT AND PLAN  67 y.o. year old female  has a past medical history of Anginal pain ([place]), Anxiety, Bipolar 1 disorder ([place]), Colon polyp, CTS (carpal tunnel syndrome), Depression, Diabetes mellitus without complication ([place]), Fever blister, GERD (gastroesophageal reflux disease), HA (headache), HTN (hypertension), Hypercholesterolemia, Migraines, OA (osteoarthritis), and Schizo-affective psychosis ([place]). here with    Migraine with aura and without status migrainosus, not intractable  Gloria Lewis is doing well and migraines are well managed. She will continue topiramate 100 mg twice daily and Ubrelvy as needed for abortive therapy. She will continue to follow closely with primary care provider and psychiatrist for routine lab work. Follow up with me in 1 year or sooner if needed.  No orders of the defined types were placed in this encounter.    Meds ordered this encounter  Medications   topiramate (TOPAMAX) 100 MG tablet    Sig: Take 1 tablet (100 mg total) by mouth 2 (two) times daily.    Dispense:  180 tablet    Refill:  3    Order Specific Question:   Supervising Provider    Answer:   Melvenia Beam [[id number]]   Ubrogepant (UBRELVY) 50 MG TABS    Sig: Take one tablet onset migraine, may repeat in 2 hours if needed (max 2 tabs/24 hours)    Dispense:  10 tablet    Refill:  11    Order Specific Question:   Supervising Provider    Answer:   Melvenia Beam [[id number]]      Debbora Presto, MSN,  FNP-C 06/04/2021, 9:05 AM  [facility] [address], [place], [place] [address] [phone]

## 2021-06-04 NOTE — Telephone Encounter (Signed)
Submitted PA on [employer]ctracks portal. Confirmation #:[id number] W. Prior Approval [id number].  ?

## 2021-06-10 ENCOUNTER — Other Ambulatory Visit: Payer: Self-pay

## 2021-06-10 ENCOUNTER — Ambulatory Visit (INDEPENDENT_AMBULATORY_CARE_PROVIDER_SITE_OTHER): Payer: Medicaid Other | Admitting: Licensed Clinical Social Worker

## 2021-06-10 DIAGNOSIS — F331 Major depressive disorder, recurrent, moderate: Secondary | ICD-10-CM | POA: Diagnosis not present
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
## 2021-06-24 ENCOUNTER — Other Ambulatory Visit (HOSPITAL_COMMUNITY): Payer: Self-pay | Admitting: Psychiatry

## 2021-07-02 ENCOUNTER — Other Ambulatory Visit: Payer: Self-pay

## 2021-07-02 ENCOUNTER — Ambulatory Visit (INDEPENDENT_AMBULATORY_CARE_PROVIDER_SITE_OTHER): Payer: Medicaid Other | Admitting: Licensed Clinical Social Worker

## 2021-07-02 DIAGNOSIS — F3341 Major depressive disorder, recurrent, in partial remission: Secondary | ICD-10-CM | POA: Diagnosis not present

## 2021-07-02 NOTE — Progress Notes (Signed)
? ?  THERAPIST PROGRESS NOTE ? ?Session Time: 40 min ? ?Participation Level: Active ? ?Behavioral Response: CasualAlertEuthymic ? ?Type of Therapy: Individual Therapy ? ?Treatment Goals addressed: dep/anx/stressors/coping ? ?ProgressTowards Goals: Progressing ? ?Interventions: Supportive ? ?Summary: Gloria Lewis is a 66 y.o. female who presents with hx of dep/anx.  Today patient returns for in person session.  She is aware that this is final session with this clinician due to clinician's resignation.  Patient provides updates on both of her sons.  She states her son with schizophrenia is now at [facility], has an act team and she is moving forward with setting appropriate boundaries with her son.  She advises that she and Gloria Lewis her other son are back on speaking terms and doing well with one another.  Gloria Lewis reports she had to have an endoscopy yesterday and her son Gloria Lewis was helpful in providing care for her.  Gloria Lewis advises she is continuing to go to the gym on a regular basis, attends church regularly and feels she is in a better and good place at this time.  Her biggest stressor continues to be her landlord.  Patient provides details regarding this landlord's behaviors.  Gloria Lewis reports she has contacted [employer].  LCSW assessed for patient being able to reconnect with her past counselor.  Gloria Lewis states she has reconnected with counselor, is doing required paperwork and will be able to get an appointment to see this counselor going forward. Gloria Lewis and LCSW wish each other well. Pt states appreciation for care. ? ?Suicidal/Homicidal: Nowithout intent/plan ? ?Therapist Response: Pt receptive to care. ? ?Plan: Return again prn. ? ?Diagnosis: MDD (major depressive disorder), recurrent, in partial remission ([name]) ? ?Collaboration of Care: Other None deemed necessary this session. ? ?Patient/Guardian was advised Release of Information must be obtained prior to any record release in order to collaborate their care with an  outside provider. Patient/Guardian was advised if they have not already done so to contact the registration department to sign all necessary forms in order for [place] to release information regarding their care.  ? ?Consent: Patient/Guardian gives verbal consent for treatment and assignment of benefits for services provided during this visit. Patient/Guardian expressed understanding and agreed to proceed.  ? ?[name], LCSW ?07/02/2021 ? ?

## 2021-07-15 ENCOUNTER — Ambulatory Visit (HOSPITAL_COMMUNITY): Payer: Medicaid Other | Admitting: Licensed Clinical Social Worker

## 2021-07-15 ENCOUNTER — Telehealth (HOSPITAL_BASED_OUTPATIENT_CLINIC_OR_DEPARTMENT_OTHER): Payer: Medicaid Other | Admitting: Psychiatry

## 2021-07-15 ENCOUNTER — Encounter (HOSPITAL_COMMUNITY): Payer: Self-pay

## 2021-07-15 DIAGNOSIS — F329 Major depressive disorder, single episode, unspecified: Secondary | ICD-10-CM

## 2021-07-15 MED ORDER — CLONAZEPAM 1 MG PO TABS: ORAL_TABLET | ORAL | 4 refills | Status: DC

## 2021-07-15 MED ORDER — TEMAZEPAM 15 MG PO CAPS: 15.0000 mg | ORAL_CAPSULE | Freq: Every evening | ORAL | 4 refills | Status: DC | PRN

## 2021-07-15 MED ORDER — NORTRIPTYLINE HCL 50 MG PO CAPS: ORAL_CAPSULE | ORAL | 4 refills | Status: DC

## 2021-07-15 MED ORDER — MIRTAZAPINE 30 MG PO TABS: 30.0000 mg | ORAL_TABLET | Freq: Every day | ORAL | 4 refills | Status: DC

## 2021-07-15 MED ORDER — NORTRIPTYLINE HCL 50 MG PO CAPS: 50.0000 mg | ORAL_CAPSULE | Freq: Every day | ORAL | 2 refills | Status: DC

## 2021-07-15 NOTE — Progress Notes (Addendum)
Patient ID: Gloria Lewis, female   DOB: 18-Apr-1955, 66 y.o.   MRN: [id number] [facility] MD Progress Note  07/15/2021 1:49 PM Gloria Lewis  MRN:  [id number] Subjective:  Shoulder hurting Principal Problem: Major Depression,recurent Mild Diagnosis: Adjustment disorder with an anxious mood stat   Today the patient is not doing all that well.  Into her apartment complex situation her son was forced to leave in January and could not live with her.  The patient found her son Berline Lopes at the [facility] which is a place where people can live for 90 days.  The patient still has to pay attention to make sure her son takes his medicines.  Patient is desperately looking for a new place to live.  She looks all the time.  She also is looking to get a therapist.  Apparently her old therapist Marjie Skiff is back in practice and she is attempting to fill out paperwork to get back to her.  The patient also had some new physical problems.  She had an endoscopy that showed significant reflux.  The patient is psychomotor slowed.  She is not getting out as much as she was.  She does not go to the gym much at all.  The patient clearly feels stressed out.  Noted also she is off her nortriptyline.  The patient is not suicidal.  She denies use of alcohol or drugs.  She has no psychosis.  She clearly is stressed by her environment which is characterized by intrusive landlord that comes into her apartment without knocking.  The environment apparently is somewhat violent as well. Patient Active Problem List   Diagnosis Date Noted   Infectious gastroenteritis [A09] 04/16/2016   Xerostomia [K11.7] 03/09/2016   Surgery, elective [Z41.9] 05/23/2015   S/P arthroscopy of shoulder [Z98.890] 05/23/2015   Chronic migraine without aura without status migrainosus, not intractable [G43.709] 10/18/2014   Tobacco abuse [Z72.0] 10/18/2014   Obesity [E66.9] 09/23/2014   COPD [J44.9] 09/02/2014   Pulmonary hypertension ([place])  [I27.20] 08/31/2014   Respiratory failure with hypoxia ([place]) [J96.91] 08/14/2014   Cigarette smoker [F17.210] 07/28/2014   Major depressive disorder, recurrent episode, moderate (HCC) [F33.1] 07/06/2014   Essential hypertension [I10]    SOB (shortness of breath) [R06.02] 06/21/2014   Precordial pain [R07.2] 06/21/2014   Gastroesophageal reflux disease [K21.9] 06/21/2014   Chest pain [R07.9] 06/21/2014   HTN (hypertension) [I10]    Neck pain [M54.2] 01/08/2014   Major depressive disorder, recurrent episode, severe, without mention of psychotic behavior [F33.2] 10/14/2012   Schizoaffective disorder ([place]) [F25.9] 05/26/2012   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] 10/06/2010   Hyperparathyroidism, primary ([place]) [E21.0] 10/06/2010   DEGENERATIVE DISC DISEASE, LUMBOSACRAL SPINE [M51.37] 05/21/2007   DERMATOPHYTOSIS OF THE BODY [B35.4] 05/10/2007   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] 03/24/2007   Anxiety state [F41.1] 03/24/2007   DENTAL PAIN [K08.9] 03/24/2007   SHOULDER PAIN, LEFT [M25.519] 03/24/2007   Total Time spent with patient:30 min  Past Psychiatric History:   Past Medical History:  Past Medical History:  Diagnosis Date   Anginal pain ([place])    admit 06/2014; had non-ischemic stress test   Anxiety    Bipolar 1 disorder (HCC)    Colon polyp    CTS (carpal tunnel syndrome)    Depression    Diabetes mellitus without complication (HCC)    Fever blister    GERD (gastroesophageal reflux disease)    HA (headache)    HTN (hypertension)    Hypercholesterolemia    Migraines  OA (osteoarthritis)    Schizo-affective psychosis [facility])     Past Surgical History:  Procedure Laterality Date   ANTERIOR CERVICAL DECOMP/DISCECTOMY FUSION  08/27/2011   Procedure: ANTERIOR CERVICAL DECOMPRESSION/DISCECTOMY FUSION 1 LEVEL/HARDWARE REMOVAL;  Surgeon: Eustace Moore, MD;  Location: [place] [facility];  Service: Neurosurgery;  Laterality: Bilateral;  Cervical four-five Anterior cervical  decompression/diskectomy, fusion, Plate, Removal of Cervical five-seven Plate   back injection     CARDIAC CATHETERIZATION N/A 11/09/2014   Procedure: Right Heart Cath;  Surgeon: Larey Dresser, MD;  Location: [place] [facility];  Service: Cardiovascular;  Laterality: N/A;   CARPAL TUNNEL RELEASE  20110 rt/lt   rt x2 , lt x1   COLONOSCOPY  06/2017   [facility]   ESOPHAGOGASTRODUODENOSCOPY  06/2017   [place]     MULTIPLE TOOTH EXTRACTIONS     NECK SURGERY  2009   PITUITARY SURGERY     Had gland removed from producing too much calcium   polp removed  2011   RIGHT/LEFT HEART CATH AND CORONARY ANGIOGRAPHY N/A 07/05/2017   Procedure: RIGHT/LEFT HEART CATH AND CORONARY ANGIOGRAPHY;  Surgeon: Larey Dresser, MD;  Location: [place] [facility];  Service: Cardiovascular;  Laterality: N/A;   SHOULDER ARTHROSCOPY WITH ROTATOR CUFF REPAIR Right 05/23/2015   Procedure: RIGHT SHOULDER ARTHROSCOPY WITH REMOVAL OF SUTURE ANCHOR AND POSSIBLE REVISION ROTATOR CUFF REPAIR;  Surgeon: Justice Britain, MD;  Location: [place];  Service: Orthopedics;  Laterality: Right;   SHOULDER ARTHROSCOPY WITH SUBACROMIAL DECOMPRESSION Right 01/24/2015   Procedure: RIGHT SHOULDER ARTHROSCOPY WITH SUBACROMIAL DECOMPRESSION AD DISTAL CLAVICLE RESECTION ;  Surgeon: Justice Britain, MD;  Location: [place];  Service: Orthopedics;  Laterality: Right;   VAGINAL DELIVERY     x3   Family History:  Family History  Problem Relation Age of Onset   Coronary artery disease Father    Cancer Father        head neck    Esophageal cancer Father    Hypertension Mother    Schizophrenia Mother    Diabetes Mother    Heart attack Mother    Depression Brother    Suicidality Brother    Prostate cancer Brother    Cancer Brother        bone marrow   Schizophrenia Maternal Grandmother    Anesthesia problems Neg Hx    Hypotension Neg Hx    Malignant hyperthermia Neg Hx    Pseudochol deficiency Neg Hx     Allergic rhinitis Neg Hx    Angioedema Neg Hx    Asthma Neg Hx    Atopy Neg Hx    Eczema Neg Hx    Immunodeficiency Neg Hx    Urticaria Neg Hx    Breast cancer Neg Hx    Family Psychiatric  History:  Social History:  Social History   Substance and Sexual Activity  Alcohol Use No   Alcohol/week: 0.0 standard drinks     Social History   Substance and Sexual Activity  Drug Use No   Comment: hx crack addiction 2008    Social History   Socioeconomic History   Marital status: Single    Spouse name: Not on file   Number of children: 3   Years of education: 12   Highest education level: Some college, no degree  Occupational History   Occupation: disabled/retired  Tobacco Use   Smoking status: Some Days    Packs/day: 0.10    Years: 30.00  Pack years: 3.00    Types: Cigarettes   Smokeless tobacco: Never  Vaping Use   Vaping Use: Some days  Substance and Sexual Activity   Alcohol use: No    Alcohol/week: 0.0 standard drinks   Drug use: No    Comment: hx crack addiction 2008   Sexual activity: Never  Other Topics Concern   Not on file  Social History Narrative   Lives in a two story home alone      [place] in [place] for pain management      Right handed   12th grade      Caffeine coffee 1 cup /day   Social Determinants of Health   Financial Resource Strain: Not on file  Food Insecurity: Not on file  Transportation Needs: Not on file  Physical Activity: Not on file  Stress: Not on file  Social Connections: Not on file   Additional Social History:                         Sleep: Good  Appetite:  Fair  Current Medications: Current Outpatient Medications  Medication Sig Dispense Refill   ASHWAGANDHA PO Take 1 capsule by mouth 2 (two) times daily.     azelastine (ASTELIN) 0.1 % nasal spray Place 1 spray into both nostrils 2 (two) times daily.     cetirizine (ZYRTEC ALLERGY) 10 MG tablet Take 1 tablet (10 mg total) by mouth daily. 90  tablet 0   cholecalciferol (VITAMIN D) 1000 UNITS tablet Take 1,000 Units by mouth daily.     clonazePAM (KLONOPIN) 1 MG tablet Take 1 tablet qam & 1 tablet qhs 60 tablet 4   DEXILANT 30 MG capsule Take 1 capsule by mouth daily.     diclofenac Sodium (VOLTAREN) 1 % GEL Apply topically 4 (four) times daily. (Patient not taking: Reported on 06/04/2021)     diphenhydrAMINE (BENADRYL) 25 MG tablet 1 tablet as needed     Echinacea-Goldenseal LIQD Place 1 drop into the nose daily as needed.     Ginger, Zingiber officinalis, (GINGER EXTRACT PO) Take by mouth.     hydrocortisone (ANUSOL-HC) 2.5 % rectal cream PLACE 1 APPLICATION IN RECTUM 3 TIMES A DAY     lidocaine (LIDODERM) 5 % Place 1 patch onto the skin daily. Remove & Discard patch within 12 hours or as directed by MD 30 patch 0   Magnesium 500 MG TABS 1 tablet     mirtazapine (REMERON) 30 MG tablet Take 1 tablet (30 mg total) by mouth at bedtime. 30 tablet 4   NON FORMULARY 2 (two) times daily. [place]     nortriptyline (PAMELOR) 50 MG capsule 2 qhs 60 capsule 4   Omega-3 Fatty Acids (FISH OIL PO) Take by mouth 2 (two) times a day.     ondansetron (ZOFRAN) 8 MG tablet Take 1 tablet (8 mg total) by mouth every 8 (eight) hours as needed for nausea or vomiting. 20 tablet 3   oxyCODONE-acetaminophen (PERCOCET) 10-325 MG tablet Take 1 tablet by mouth 2 (two) times daily.     potassium chloride SA (K-DUR,KLOR-CON) 20 MEQ tablet Take 20 mEq by mouth 2 (two) times daily.     promethazine-dextromethorphan (PROMETHAZINE-DM) 6.25-15 MG/5ML syrup 5 ml as needed     rosuvastatin (CRESTOR) 10 MG tablet 1 tablet     temazepam (RESTORIL) 15 MG capsule Take 1 capsule (15 mg total) by mouth at bedtime as needed for sleep. 30 capsule 4  topiramate (TOPAMAX) 100 MG tablet Take 1 tablet (100 mg total) by mouth 2 (two) times daily. 180 tablet 3   triamterene-hydrochlorothiazide (DYAZIDE) 50-25 MG capsule Take 1 capsule by mouth daily.     TURMERIC PO Take by mouth as  needed.     Ubrogepant (UBRELVY) 50 MG TABS Take one tablet onset migraine, may repeat in 2 hours if needed (max 2 tabs/24 hours) 10 tablet 11   valACYclovir (VALTREX) 1000 MG tablet Take 1,000 mg by mouth daily.     vitamin B-12 (CYANOCOBALAMIN) 1000 MCG tablet Take 1,000 mcg by mouth daily.     No current facility-administered medications for this visit.    Lab Results: No results found for this or any previous visit (from the past 48 hour(s)).  Physical Findings: AIMS:  , ,  ,  ,    CIWA:    COWS:     Musculoskeletal: Strength & Muscle Tone: within normal limits Gait & Station: normal Patient leans: N/A  Psychiatric Specialty Exam: ROS  There were no vitals taken for this visit.There is no height or weight on file to calculate BMI.  General Appearance: Casual  Eye Contact::  Good  Speech:  Clear and Coherent  Volume:  Normal  Mood:  Euthymic  Affect:  Congruent  Thought Process:  Coherent  Orientation:  Full (Time, Place, and Person)  Thought Content:  WDL  Suicidal Thoughts:  No  Homicidal Thoughts:  No  Memory:  NA  Judgement:  Good  Insight:  Fair  Psychomotor Activity:  Normal  Concentration:  Fair  Recall:  Good  Fund of Knowledge:Good  Language: Good  Akathisia:  No  Handed:  Right  AIMS (if indicated):     Assets:   ADL's:  Intact  Cognition: WNL  Sleep:      Virtual Visit via Telephone Note  I connected with Enis Gash on 09/29/22 at  1:00 PM EDT by telephone and verified that I am speaking with the correct person using two identifiers.  Location: Patient:in home Provider: in office   I discussed the limitations, risks, security and privacy concerns of performing an evaluation and management service by telephone and the availability of in person appointments. I also discussed with the patient that there may be a patient responsible charge related to this service. The patient expressed understanding and agreed to proceed.      I discussed  the assessment and treatment plan with the patient. The patient was provided an opportunity to ask questions and all were answered. The patient agreed with the plan and demonstrated an understanding of the instructions.   The patient was advised to call back or seek an in-person evaluation if the symptoms worsen or if the condition fails to improve as anticipated.  I provided 30 minutes of non-face-to-face time during this encounter.   Gypsy Balsam, MD   Treatment Plan  07/15/2021, 1:49 PM   This patient's first problem is that of major depression.  She will continue taking Remeron 30 mg and nortriptyline 100 mg.  Her second problem is insomnia.  The patient will continue taking Restoril 15 mg.  Her third problem is an adjustment disorder with an anxious mood state.  She takes a fixed dose of Klonopin 1 mg twice daily.  The patient will be looking for a new therapist hopefully she will get that in the next month or 2.  Patient is aware that she has to take a month off from caring about  her son Gerri Spore and start taking care of herself.  This patient will be reevaluated in 2 months. Called by phone  called from center  patient at home  spent 30 minutes

## 2021-07-16 ENCOUNTER — Telehealth: Payer: Self-pay | Admitting: Family Medicine

## 2021-07-16 ENCOUNTER — Ambulatory Visit (HOSPITAL_COMMUNITY): Payer: Medicaid Other | Admitting: Psychiatry

## 2021-07-16 ENCOUNTER — Ambulatory Visit (HOSPITAL_COMMUNITY): Payer: Medicaid Other | Admitting: Licensed Clinical Social Worker

## 2021-07-16 NOTE — Telephone Encounter (Signed)
Per VO from amy If not other contraindications, Have her take Ubrelvy '100mg'$  with Tylenol '1000mg'$ , ibuprofen '800mg'$  and Benadryl '25mg'$  and drink 16-24 ounces of water with meds. Shemay repeat Ubrelvy '100mg'$  in 2 hours if migraine not improved. She may repeat Tylenol, ibuprofen and benadryl in 12 hours if needed. If not improving tomorrow, let us know!  ? ?I called pt and relayed recommendation she verbalized agreement and no contraindications she is aware of. She will update [place] tomorrow on how she feels.  ?

## 2021-07-16 NOTE — Telephone Encounter (Signed)
Pt called stating that she has been suffering from a Migraine for about a week now. She states that she has been taking her medication but it is not working for her, she has even tried being in the dark and nothing seems to help. Pt would like to be advised on what else she can do to alleviate the pain.  ?

## 2021-07-16 NOTE — Telephone Encounter (Signed)
I called pt, she reports lingering migraine for 1 week now. Reports nausea, vomiting and light/noise sensitivity have all been present at some point during the week. Pain is localized to the right temple and is aggravated with movement.  ? ?She is taking ubrelvy as recommended along with Topamax 100 mg bid but medications are not helping. ?She also reports she had tried sitting in a cool dark place and rest but no relief.  ? ?I asked the pt if in the past with migraine if any PRN treatment has been beneficial ( ie. Infusion, injections, or steroids dose pack) and she cannot recall. Will fwd to NP for review.  ?

## 2021-07-18 ENCOUNTER — Telehealth: Payer: Self-pay

## 2021-07-18 NOTE — Telephone Encounter (Signed)
Received paper referral to PREP ?Called this am. Can start the April 24th class, M/W 1p-215pm  ?Intake scheduled for 1pm on 07/23/21. Will come by my office at appt time.  ?

## 2021-07-23 NOTE — Progress Notes (Signed)
[facility] PREP Evaluation ? ?Patient Details  ?Name: Gloria Lewis ?MRN: [id number] ?Date of Birth: 01-22-1956 ?Age: 66 y.o. ?PCP: Harlan Stains, MD ? ?Vitals:  ? 07/23/21 1316  ?BP: 106/78  ?Pulse: 94  ?Weight: 228 lb 12.8 oz (103.8 kg)  ? ? ? [facility] Eval - 07/23/21 1300   ? ?  ? [facility]PREP[facility] Location  ? [facility] "PREP[facility] Location [place]   ?  ? Referral   ? Referring Provider White   ? Reason for referral Hypertension;High Cholesterol   stress  ? Program Start Date 07/28/21   MW 1pm-215pm x 12 wks  ?  ? Measurement  ? Waist Circumference 48 inches   ? Hip Circumference 50 inches   ? Body fat 44.3 percent   ?  ? Information for Trainer  ? Goals eat better, stress relief, get off pain meds   ? Current Exercise swims   ? Orthopedic Concerns Knee pain bil, right rotator cuff tear not repaired, back pain   ? Pertinent Medical History HTN, Depression/anxiety,  inc chol   ? Current Barriers conference in June   ? Restrictions/Precautions --   none  ? Medications that affect exercise Medication causing dizziness/drowsiness   ?  ? Timed Up and Go (TUGS)  ? Timed Up and Go Low risk <9 seconds   ?  ? Mobility and Daily Activities  ? I find it easy to walk up or down two or more flights of stairs. 1   ? I have no trouble taking out the trash. 4   ? I do housework such as vacuuming and dusting on my own without difficulty. 4   ? I can easily lift a gallon of milk (8lbs). 4   ? I can easily walk a mile. 2   ? I have no trouble reaching into high cupboards or reaching down to pick up something from the floor. 3   ? I do not have trouble doing out-door work such as Armed forces logistics/support/administrative officer, raking leaves, or gardening. 4   ?  ? Mobility and Daily Activities  ? I feel younger than my age. 2   ? I feel independent. 4   ? I feel energetic. 2   ? I live an active life.  3   ? I feel strong. 2   ? I feel healthy. 3   ? I feel active as other people my age. 4   ?  ? How fit and strong are you.  ? Fit and Strong Total Score 42   ? ?  ?  ? ?   ? ?Past Medical History:  ?Diagnosis Date  ? Anginal pain ([place])   ? admit 06/2014; had non-ischemic stress test  ? Anxiety   ? Bipolar 1 disorder ([place])   ? Colon polyp   ? CTS (carpal tunnel syndrome)   ? Depression   ? Diabetes mellitus without complication ([place])   ? Fever blister   ? GERD (gastroesophageal reflux disease)   ? HA (headache)   ? HTN (hypertension)   ? Hypercholesterolemia   ? Migraines   ? OA (osteoarthritis)   ? Schizo-affective psychosis ([place])   ? ?Past Surgical History:  ?Procedure Laterality Date  ? ANTERIOR CERVICAL DECOMP/DISCECTOMY FUSION  08/27/2011  ? Procedure: ANTERIOR CERVICAL DECOMPRESSION/DISCECTOMY FUSION 1 LEVEL/HARDWARE REMOVAL;  Surgeon: Eustace Moore, MD;  Location: [place] [facility];  Service: Neurosurgery;  Laterality: Bilateral;  Cervical four-five Anterior cervical decompression/diskectomy, fusion, Plate, Removal of Cervical  five-seven Plate  ? back injection    ? CARDIAC CATHETERIZATION N/A 11/09/2014  ? Procedure: Right Heart Cath;  Surgeon: Larey Dresser, MD;  Location: [place] [facility];  Service: Cardiovascular;  Laterality: N/A;  ? CARPAL TUNNEL RELEASE  20110 rt/lt  ? rt x2 , lt x1  ? COLONOSCOPY  06/2017  ? [facility]  ? ESOPHAGOGASTRODUODENOSCOPY  06/2017  ? [facility]  ? HEMORRHOID SURGERY    ? MULTIPLE TOOTH EXTRACTIONS    ? NECK SURGERY  2009  ? PITUITARY SURGERY    ? Had gland removed from producing too much calcium  ? polp removed  2011  ? RIGHT/LEFT HEART CATH AND CORONARY ANGIOGRAPHY N/A 07/05/2017  ? Procedure: RIGHT/LEFT HEART CATH AND CORONARY ANGIOGRAPHY;  Surgeon: Larey Dresser, MD;  Location: [place] [facility];  Service: Cardiovascular;  Laterality: N/A;  ? SHOULDER ARTHROSCOPY WITH ROTATOR CUFF REPAIR Right 05/23/2015  ? Procedure: RIGHT SHOULDER ARTHROSCOPY WITH REMOVAL OF SUTURE ANCHOR AND POSSIBLE REVISION ROTATOR CUFF REPAIR;  Surgeon: Justice Britain, MD;  Location: [place];  Service: Orthopedics;  Laterality: Right;  ? SHOULDER  ARTHROSCOPY WITH SUBACROMIAL DECOMPRESSION Right 01/24/2015  ? Procedure: RIGHT SHOULDER ARTHROSCOPY WITH SUBACROMIAL DECOMPRESSION AD DISTAL CLAVICLE RESECTION ;  Surgeon: Justice Britain, MD;  Location: [place];  Service: Orthopedics;  Laterality: Right;  ? VAGINAL DELIVERY    ? x3  ? ?Social History  ? ?Tobacco Use  ?Smoking Status Some Days  ? Packs/day: 0.10  ? Years: 30.00  ? Pack years: 3.00  ? Types: Cigarettes  ?Smokeless Tobacco Never  ? ? ?Barnett Hatter ?07/23/2021, 1:21 PM ? ? ?

## 2021-07-25 ENCOUNTER — Ambulatory Visit (HOSPITAL_COMMUNITY): Payer: Medicaid Other | Admitting: Psychiatry

## 2021-07-28 ENCOUNTER — Ambulatory Visit (HOSPITAL_COMMUNITY): Payer: Medicaid Other | Admitting: Licensed Clinical Social Worker

## 2021-09-23 ENCOUNTER — Other Ambulatory Visit (HOSPITAL_COMMUNITY): Payer: Self-pay | Admitting: Psychiatry

## 2021-09-23 ENCOUNTER — Telehealth (HOSPITAL_COMMUNITY): Payer: Self-pay | Admitting: *Deleted

## 2021-09-23 MED ORDER — HYDROXYZINE PAMOATE 25 MG PO CAPS: ORAL_CAPSULE | ORAL | 1 refills | Status: DC

## 2021-09-23 NOTE — Telephone Encounter (Signed)
Pt called c/o increased anxiety and "a lot going on" and requesting increase in Klonopin or starting/adding something else for anxiety. Pt next scheduled appointment ids scheduled for 10/28/21. Please review.

## 2021-10-08 ENCOUNTER — Other Ambulatory Visit (HOSPITAL_COMMUNITY): Payer: Self-pay | Admitting: Psychiatry

## 2021-10-08 MED ORDER — CLONAZEPAM 1 MG PO TABS: ORAL_TABLET | ORAL | 4 refills | Status: DC

## 2021-10-09 ENCOUNTER — Telehealth (HOSPITAL_COMMUNITY): Payer: Self-pay | Admitting: *Deleted

## 2021-10-09 NOTE — Telephone Encounter (Signed)
Pt called upset that provider had not increased her dose of Klonopin. This was discussed with Dr.Plovsky yesterday and he was to call pt however was not able to connect with her. Writer attempted to call pt back and when I identified myself, pt hung up on me and would not answer return calls. Pt has an appointment scheduled for 10/28/21 with Dr. Casimiro Needle.

## 2021-10-27 NOTE — Progress Notes (Signed)
Final PREP class 10/22/21 Pt asked to withdrawal from 07/28/21 class and be considered for future PREP classes  Attended 3 of 24 classes.

## 2021-10-28 ENCOUNTER — Ambulatory Visit (HOSPITAL_BASED_OUTPATIENT_CLINIC_OR_DEPARTMENT_OTHER): Payer: 59 | Admitting: Psychiatry

## 2021-10-28 DIAGNOSIS — F3341 Major depressive disorder, recurrent, in partial remission: Secondary | ICD-10-CM

## 2021-10-28 MED ORDER — CLONAZEPAM 1 MG PO TABS: ORAL_TABLET | ORAL | 4 refills | Status: DC

## 2021-10-28 MED ORDER — MIRTAZAPINE 30 MG PO TABS: 30.0000 mg | ORAL_TABLET | Freq: Every day | ORAL | 4 refills | Status: DC

## 2021-10-28 MED ORDER — NORTRIPTYLINE HCL 50 MG PO CAPS: ORAL_CAPSULE | ORAL | 4 refills | Status: DC

## 2021-10-28 MED ORDER — TEMAZEPAM 15 MG PO CAPS: 15.0000 mg | ORAL_CAPSULE | Freq: Every evening | ORAL | 4 refills | Status: DC | PRN

## 2021-10-28 NOTE — Progress Notes (Signed)
Patient ID: Gloria Lewis, female   DOB: [birth year], 66 y.o.   MRN: [id number] [facility] MD Progress Note  10/28/2021 1:54 PM Gloria Lewis  MRN:  [id number] Subjective:  Shoulder hurting Principal Problem: Major Depression,recurent Mild Diagnosis: Adjustment disorder with an anxious mood stat     Today the patient is seen in the office.  Today the patient is doing fairly well.  She still tried to live with her son [name].  Unfortunately in her living environment which is family supported she could not live with [name].  Presently she lives in her own place while [place] stays with a friend.  Her plans are to find a new living environment.  She wants to live with her son [name].  [name] is fairly stable.  He now has his own ACT team.  He takes a variety of medications.  He is trying to get back into school as well for massage therapy.  The patient herself is looking for an undergraduate degree.  Her plans are to finish her degree and she [employer].  She will get into psychology or some fine arts degree.  The patient physically is fairly healthy.  She has a rotator cuff tear on her right shoulder but it does not stop her from going to the gym 4 days a week.  She goes first thing in the morning and 4 or 5:00.  The patient denies persistent daily depression.  Generally she is sleeping and eating well and has good energy.  She has no problems thinking and concentrating and looks forward to getting back to college.  She has good sense of worth.  She is not suicidal.  She drinks no alcohol and uses no drugs.  She takes her medicines as prescribed except she is only taking 50 mg of nortriptyline.  There was some confusion about her Klonopin but it worked out well.  She never had to increase the Klonopin and only takes 1 mg twice a day.  [facility] whose name is [place].  She has a good response and is a good relationship.  Overall patient is  functioning well.  She has no evidence of psychosis.  Importantly she is not drinking or using any drugs.  I think she is very stable and she will return to see me in 3 months. Patient Active Problem List   Diagnosis Date Noted   Infectious gastroenteritis [A09] 04/16/2016   Xerostomia [K11.7] 03/09/2016   Surgery, elective [Z41.9] 05/23/2015   S/P arthroscopy of shoulder [Z98.890] 05/23/2015   Chronic migraine without aura without status migrainosus, not intractable [G43.709] 10/18/2014   Tobacco abuse [Z72.0] 10/18/2014   Obesity [E66.9] 09/23/2014   COPD [J44.9] 09/02/2014   Pulmonary hypertension ([place]) [I27.20] 08/31/2014   Respiratory failure with hypoxia ([place]) [J96.91] 08/14/2014   Cigarette smoker [F17.210] 07/28/2014   Major depressive disorder, recurrent episode, moderate (HCC) [F33.1] 07/06/2014   Essential hypertension [I10]    SOB (shortness of breath) [R06.02] 06/21/2014   Precordial pain [R07.2] 06/21/2014   Gastroesophageal reflux disease [K21.9] 06/21/2014   Chest pain [R07.9] 06/21/2014   HTN (hypertension) [I10]    Neck pain [M54.2] 01/08/2014   Major depressive disorder, recurrent episode, severe, without mention of psychotic behavior [F33.2] 10/14/2012   Schizoaffective disorder ([place]) [F25.9] 05/26/2012   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] 10/06/2010   Hyperparathyroidism, primary ([place]) [E21.0] 10/06/2010   DEGENERATIVE DISC DISEASE, LUMBOSACRAL SPINE [M51.37] 05/21/2007   DERMATOPHYTOSIS OF THE BODY [  B35.4] 05/10/2007   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] 03/24/2007   Anxiety state [F41.1] 03/24/2007   DENTAL PAIN [K08.9] 03/24/2007   SHOULDER PAIN, LEFT [M25.519] 03/24/2007   Total Time spent with patient:30 min  Past Psychiatric History:   Past Medical History:  Past Medical History:  Diagnosis Date   Anginal pain ([place])    admit 06/2014; had non-ischemic stress test   Anxiety    Bipolar 1 disorder (HCC)    Colon polyp    CTS (carpal tunnel syndrome)     Depression    Diabetes mellitus without complication (HCC)    Fever blister    GERD (gastroesophageal reflux disease)    HA (headache)    HTN (hypertension)    Hypercholesterolemia    Migraines    OA (osteoarthritis)    Schizo-affective psychosis ([place])     Past Surgical History:  Procedure Laterality Date   ANTERIOR CERVICAL DECOMP/DISCECTOMY FUSION  08/27/2011   Procedure: ANTERIOR CERVICAL DECOMPRESSION/DISCECTOMY FUSION 1 LEVEL/HARDWARE REMOVAL;  Surgeon: Eustace Moore, MD;  Location: [facility];  Service: Neurosurgery;  Laterality: Bilateral;  Cervical four-five Anterior cervical decompression/diskectomy, fusion, Plate, Removal of Cervical five-seven Plate   back injection     CARDIAC CATHETERIZATION N/A 11/09/2014   Procedure: Right Heart Cath;  Surgeon: Larey Dresser, MD;  Location: [facility];  Service: Cardiovascular;  Laterality: N/A;   CARPAL TUNNEL RELEASE  20110 rt/lt   rt x2 , lt x1   COLONOSCOPY  06/2017   [facility]   ESOPHAGOGASTRODUODENOSCOPY  06/2017   [facility]     MULTIPLE TOOTH EXTRACTIONS     NECK SURGERY  2009   PITUITARY SURGERY     Had gland removed from producing too much calcium   polp removed  2011   RIGHT/LEFT HEART CATH AND CORONARY ANGIOGRAPHY N/A 07/05/2017   Procedure: RIGHT/LEFT HEART CATH AND CORONARY ANGIOGRAPHY;  Surgeon: Larey Dresser, MD;  Location: [place] [facility];  Service: Cardiovascular;  Laterality: N/A;   SHOULDER ARTHROSCOPY WITH ROTATOR CUFF REPAIR Right 05/23/2015   Procedure: RIGHT SHOULDER ARTHROSCOPY WITH REMOVAL OF SUTURE ANCHOR AND POSSIBLE REVISION ROTATOR CUFF REPAIR;  Surgeon: Justice Britain, MD;  Location: [facility];  Service: Orthopedics;  Laterality: Right;   SHOULDER ARTHROSCOPY WITH SUBACROMIAL DECOMPRESSION Right 01/24/2015   Procedure: RIGHT SHOULDER ARTHROSCOPY WITH SUBACROMIAL DECOMPRESSION AD DISTAL CLAVICLE RESECTION ;  Surgeon: Justice Britain, MD;  Location: [place];   Service: Orthopedics;  Laterality: Right;   VAGINAL DELIVERY     x3   Family History:  Family History  Problem Relation Age of Onset   Coronary artery disease Father    Cancer Father        head neck    Esophageal cancer Father    Hypertension Mother    Schizophrenia Mother    Diabetes Mother    Heart attack Mother    Depression Brother    Suicidality Brother    Prostate cancer Brother    Cancer Brother        bone marrow   Schizophrenia Maternal Grandmother    Anesthesia problems Neg Hx    Hypotension Neg Hx    Malignant hyperthermia Neg Hx    Pseudochol deficiency Neg Hx    Allergic rhinitis Neg Hx    Angioedema Neg Hx    Asthma Neg Hx    Atopy Neg Hx    Eczema Neg Hx    Immunodeficiency Neg Hx  Urticaria Neg Hx    Breast cancer Neg Hx    Family Psychiatric  History:  Social History:  Social History   Substance and Sexual Activity  Alcohol Use No   Alcohol/week: 0.0 standard drinks of alcohol     Social History   Substance and Sexual Activity  Drug Use No   Comment: hx crack addiction 2008    Social History   Socioeconomic History   Marital status: Single    Spouse name: Not on file   Number of children: 3   Years of education: 12   Highest education level: Some college, no degree  Occupational History   Occupation: disabled/retired  Tobacco Use   Smoking status: Some Days    Packs/day: 0.10    Years: 30.00    Total pack years: 3.00    Types: Cigarettes   Smokeless tobacco: Never  Vaping Use   Vaping Use: Some days  Substance and Sexual Activity   Alcohol use: No    Alcohol/week: 0.0 standard drinks of alcohol   Drug use: No    Comment: hx crack addiction 2008   Sexual activity: Never  Other Topics Concern   Not on file  Social History Narrative   Lives in a two story home alone      [facility] in [place] [facility] pain management      Right handed   12th grade      Caffeine coffee 1 cup /day   Social Determinants of Health    Financial Resource Strain: Medium Risk (03/12/2017)   Overall Financial Resource Strain (CARDIA)    Difficulty of Paying Living Expenses: Somewhat hard  Food Insecurity: Food Insecurity Present (03/12/2017)   Hunger Vital Sign    Worried About Running Out of Food in the Last Year: Sometimes true    Ran Out of Food in the Last Year: Sometimes true  Transportation Needs: Unmet Transportation Needs (03/12/2017)   PRAPARE - Transportation    Lack of Transportation (Medical): Yes    Lack of Transportation (Non-Medical): Yes  Physical Activity: Inactive (03/12/2017)   Exercise Vital Sign    Days of Exercise per Week: 0 days    Minutes of Exercise per Session: 0 min  Stress: Stress Concern Present (03/12/2017)   [place]    Feeling of Stress : Rather much  Social Connections: Moderately Integrated (03/12/2017)   Social Connection and Isolation Panel [NHANES]    Frequency of Communication with Friends and Family: More than three times a week    Frequency of Social Gatherings with Friends and Family: More than three times a week    Attends Religious Services: More than 4 times per year    Active Member of [employer] or Organizations: Yes    Attends Music therapist: More than 4 times per year    Marital Status: Never married   Additional Social History:                         Sleep: Good  Appetite:  Fair  Current Medications: Current Outpatient Medications  Medication Sig Dispense Refill   ASHWAGANDHA PO Take 1 capsule by mouth 2 (two) times daily.     azelastine (ASTELIN) 0.1 % nasal spray Place 1 spray into both nostrils 2 (two) times daily.     cetirizine (ZYRTEC ALLERGY) 10 MG tablet Take 1 tablet (10 mg total) by mouth daily. 90 tablet 0  cholecalciferol (VITAMIN D) 1000 UNITS tablet Take 1,000 Units by mouth daily.     clonazePAM (KLONOPIN) 1 MG tablet Take 1 tablet qam & 1 tablet qhs 60 tablet  4   DEXILANT 30 MG capsule Take 1 capsule by mouth daily.     diclofenac Sodium (VOLTAREN) 1 % GEL Apply topically 4 (four) times daily. (Patient not taking: Reported on 06/04/2021)     diphenhydrAMINE (BENADRYL) 25 MG tablet 1 tablet as needed     Echinacea-Goldenseal LIQD Place 1 drop into the nose daily as needed.     Ginger, Zingiber officinalis, (GINGER EXTRACT PO) Take by mouth.     hydrocortisone (ANUSOL-HC) 2.5 % rectal cream PLACE 1 APPLICATION IN RECTUM 3 TIMES A DAY     hydrOXYzine (VISTARIL) 25 MG capsule 2 qhs and may increase to 3 qhs if not sleeping 90 capsule 1   lidocaine (LIDODERM) 5 % Place 1 patch onto the skin daily. Remove & Discard patch within 12 hours or as directed by MD 30 patch 0   Magnesium 500 MG TABS 1 tablet     mirtazapine (REMERON) 30 MG tablet Take 1 tablet (30 mg total) by mouth at bedtime. 30 tablet 4   NON FORMULARY 2 (two) times daily. [place]     nortriptyline (PAMELOR) 50 MG capsule 2 qhs 60 capsule 4   Omega-3 Fatty Acids (FISH OIL PO) Take by mouth 2 (two) times a day.     ondansetron (ZOFRAN) 8 MG tablet Take 1 tablet (8 mg total) by mouth every 8 (eight) hours as needed for nausea or vomiting. 20 tablet 3   oxyCODONE-acetaminophen (PERCOCET) 10-325 MG tablet Take 1 tablet by mouth 2 (two) times daily.     potassium chloride SA (K-DUR,KLOR-CON) 20 MEQ tablet Take 20 mEq by mouth 2 (two) times daily.     promethazine-dextromethorphan (PROMETHAZINE-DM) 6.25-15 MG/5ML syrup 5 ml as needed     rosuvastatin (CRESTOR) 10 MG tablet 1 tablet     temazepam (RESTORIL) 15 MG capsule Take 1 capsule (15 mg total) by mouth at bedtime as needed for sleep. 30 capsule 4   topiramate (TOPAMAX) 100 MG tablet Take 1 tablet (100 mg total) by mouth 2 (two) times daily. 180 tablet 3   triamterene-hydrochlorothiazide (DYAZIDE) 50-25 MG capsule Take 1 capsule by mouth daily.     TURMERIC PO Take by mouth as needed.     Ubrogepant (UBRELVY) 50 MG TABS Take one tablet onset  migraine, may repeat in 2 hours if needed (max 2 tabs/24 hours) 10 tablet 11   valACYclovir (VALTREX) 1000 MG tablet Take 1,000 mg by mouth daily.     vitamin B-12 (CYANOCOBALAMIN) 1000 MCG tablet Take 1,000 mcg by mouth daily.     No current facility-administered medications for this visit.    Lab Results: No results found for this or any previous visit (from the past 48 hour(s)).  Physical Findings: AIMS:  , ,  ,  ,    CIWA:    COWS:     Musculoskeletal: Strength & Muscle Tone: within normal limits Gait & Station: normal Patient leans: N/A  Psychiatric Specialty Exam: ROS  There were no vitals taken for this visit.There is no height or weight on file to calculate BMI.  General Appearance: Casual  Eye Contact::  Good  Speech:  Clear and Coherent  Volume:  Normal  Mood:  Euthymic  Affect:  Congruent  Thought Process:  Coherent  Orientation:  Full (Time, Place, and Person)  Thought Content:  WDL  Suicidal Thoughts:  No  Homicidal Thoughts:  No  Memory:  NA  Judgement:  Good  Insight:  Fair  Psychomotor Activity:  Normal  Concentration:  Fair  Recall:  Good  Fund of Knowledge:Good  Language: Good  Akathisia:  No  Handed:  Right  AIMS (if indicated):     Assets:   ADL's:  Intact  Cognition: WNL  Sleep:       Treatment Plan  10/28/2021, 1:54 PM  This patient is diagnosed with major clinical depression.  Today we will adjust her nortriptyline back to the previous appropriate dose of 100 mg of nortriptyline and 30 mg of Remeron.  Her second problem is insomnia.  She will continue taking Restoril as prescribed.  Her third problem is an adjustment disorder with an anxious mood state.  The precipitant is mainly that of her chronically mentally ill son and her issues around housing.  Is very anxiety provoking for her.  She takes Klonopin 1 mg twice daily appropriately.  The patient is functioning quite well.  She will return to see me in 3 months.

## 2022-01-06 DIAGNOSIS — Z79899 Other long term (current) drug therapy: Secondary | ICD-10-CM | POA: Diagnosis not present

## 2022-01-19 DIAGNOSIS — G8929 Other chronic pain: Secondary | ICD-10-CM | POA: Diagnosis not present

## 2022-01-19 DIAGNOSIS — M545 Low back pain, unspecified: Secondary | ICD-10-CM | POA: Diagnosis not present

## 2022-01-30 DIAGNOSIS — Z0000 Encounter for general adult medical examination without abnormal findings: Secondary | ICD-10-CM | POA: Diagnosis not present

## 2022-01-30 DIAGNOSIS — M25562 Pain in left knee: Secondary | ICD-10-CM | POA: Diagnosis not present

## 2022-01-30 DIAGNOSIS — E7800 Pure hypercholesterolemia, unspecified: Secondary | ICD-10-CM | POA: Diagnosis not present

## 2022-01-30 DIAGNOSIS — F17210 Nicotine dependence, cigarettes, uncomplicated: Secondary | ICD-10-CM | POA: Diagnosis not present

## 2022-01-30 DIAGNOSIS — M129 Arthropathy, unspecified: Secondary | ICD-10-CM | POA: Diagnosis not present

## 2022-01-30 DIAGNOSIS — E559 Vitamin D deficiency, unspecified: Secondary | ICD-10-CM | POA: Diagnosis not present

## 2022-01-30 DIAGNOSIS — G8929 Other chronic pain: Secondary | ICD-10-CM | POA: Diagnosis not present

## 2022-01-30 DIAGNOSIS — R7303 Prediabetes: Secondary | ICD-10-CM | POA: Diagnosis not present

## 2022-01-30 DIAGNOSIS — Z79899 Other long term (current) drug therapy: Secondary | ICD-10-CM | POA: Diagnosis not present

## 2022-01-30 DIAGNOSIS — Z87891 Personal history of nicotine dependence: Secondary | ICD-10-CM | POA: Diagnosis not present

## 2022-01-30 DIAGNOSIS — M545 Low back pain, unspecified: Secondary | ICD-10-CM | POA: Diagnosis not present

## 2022-01-30 DIAGNOSIS — Z1159 Encounter for screening for other viral diseases: Secondary | ICD-10-CM | POA: Diagnosis not present

## 2022-01-30 DIAGNOSIS — I10 Essential (primary) hypertension: Secondary | ICD-10-CM | POA: Diagnosis not present

## 2022-02-02 DIAGNOSIS — H1045 Other chronic allergic conjunctivitis: Secondary | ICD-10-CM | POA: Diagnosis not present

## 2022-02-02 DIAGNOSIS — J4520 Mild intermittent asthma, uncomplicated: Secondary | ICD-10-CM | POA: Diagnosis not present

## 2022-02-02 DIAGNOSIS — J300 Vasomotor rhinitis: Secondary | ICD-10-CM | POA: Diagnosis not present

## 2022-02-02 DIAGNOSIS — J301 Allergic rhinitis due to pollen: Secondary | ICD-10-CM | POA: Diagnosis not present

## 2022-02-03 DIAGNOSIS — Z79899 Other long term (current) drug therapy: Secondary | ICD-10-CM | POA: Diagnosis not present

## 2022-02-04 ENCOUNTER — Ambulatory Visit (HOSPITAL_COMMUNITY): Payer: 59 | Admitting: Psychiatry

## 2022-02-17 DIAGNOSIS — Z9189 Other specified personal risk factors, not elsewhere classified: Secondary | ICD-10-CM | POA: Diagnosis not present

## 2022-03-02 DIAGNOSIS — R0602 Shortness of breath: Secondary | ICD-10-CM | POA: Diagnosis not present

## 2022-03-02 DIAGNOSIS — M25562 Pain in left knee: Secondary | ICD-10-CM | POA: Diagnosis not present

## 2022-03-02 DIAGNOSIS — R7303 Prediabetes: Secondary | ICD-10-CM | POA: Diagnosis not present

## 2022-03-02 DIAGNOSIS — F17210 Nicotine dependence, cigarettes, uncomplicated: Secondary | ICD-10-CM | POA: Diagnosis not present

## 2022-03-02 DIAGNOSIS — Z79899 Other long term (current) drug therapy: Secondary | ICD-10-CM | POA: Diagnosis not present

## 2022-03-02 DIAGNOSIS — I10 Essential (primary) hypertension: Secondary | ICD-10-CM | POA: Diagnosis not present

## 2022-03-02 DIAGNOSIS — G8929 Other chronic pain: Secondary | ICD-10-CM | POA: Diagnosis not present

## 2022-03-02 DIAGNOSIS — E7800 Pure hypercholesterolemia, unspecified: Secondary | ICD-10-CM | POA: Diagnosis not present

## 2022-03-02 DIAGNOSIS — M545 Low back pain, unspecified: Secondary | ICD-10-CM | POA: Diagnosis not present

## 2022-03-02 DIAGNOSIS — N183 Chronic kidney disease, stage 3 unspecified: Secondary | ICD-10-CM | POA: Diagnosis not present

## 2022-03-02 DIAGNOSIS — M25561 Pain in right knee: Secondary | ICD-10-CM | POA: Diagnosis not present

## 2022-03-04 DIAGNOSIS — Z79899 Other long term (current) drug therapy: Secondary | ICD-10-CM | POA: Diagnosis not present

## 2022-03-09 ENCOUNTER — Other Ambulatory Visit (HOSPITAL_COMMUNITY): Payer: Self-pay | Admitting: Psychiatry

## 2022-03-20 ENCOUNTER — Telehealth (HOSPITAL_COMMUNITY): Payer: Self-pay

## 2022-03-20 NOTE — Telephone Encounter (Signed)
Patient called to request a increase for the following medication please advise she does have an upcoming appt on 03/31/22       Disp Refills Start End   clonazePAM (KLONOPIN) 1 MG tablet 60 tablet 4 10/28/2021    Sig: Take 1 tablet qam & 1 tablet qhs   Sent to pharmacy as: clonazePAM (KLONOPIN) 1 MG tablet   E-Prescribing Status: Receipt confirmed by pharmacy (10/28/2021  1:53 PM EDT)

## 2022-03-25 DIAGNOSIS — F17210 Nicotine dependence, cigarettes, uncomplicated: Secondary | ICD-10-CM | POA: Diagnosis not present

## 2022-03-25 DIAGNOSIS — M545 Low back pain, unspecified: Secondary | ICD-10-CM | POA: Diagnosis not present

## 2022-03-25 DIAGNOSIS — Z79899 Other long term (current) drug therapy: Secondary | ICD-10-CM | POA: Diagnosis not present

## 2022-03-25 DIAGNOSIS — R7303 Prediabetes: Secondary | ICD-10-CM | POA: Diagnosis not present

## 2022-03-25 DIAGNOSIS — R0602 Shortness of breath: Secondary | ICD-10-CM | POA: Diagnosis not present

## 2022-03-25 DIAGNOSIS — M25562 Pain in left knee: Secondary | ICD-10-CM | POA: Diagnosis not present

## 2022-03-25 DIAGNOSIS — M25561 Pain in right knee: Secondary | ICD-10-CM | POA: Diagnosis not present

## 2022-03-25 DIAGNOSIS — I10 Essential (primary) hypertension: Secondary | ICD-10-CM | POA: Diagnosis not present

## 2022-03-25 DIAGNOSIS — N183 Chronic kidney disease, stage 3 unspecified: Secondary | ICD-10-CM | POA: Diagnosis not present

## 2022-03-25 DIAGNOSIS — G8929 Other chronic pain: Secondary | ICD-10-CM | POA: Diagnosis not present

## 2022-03-25 DIAGNOSIS — E7800 Pure hypercholesterolemia, unspecified: Secondary | ICD-10-CM | POA: Diagnosis not present

## 2022-03-26 DIAGNOSIS — H01135 Eczematous dermatitis of left lower eyelid: Secondary | ICD-10-CM | POA: Diagnosis not present

## 2022-03-26 DIAGNOSIS — H01132 Eczematous dermatitis of right lower eyelid: Secondary | ICD-10-CM | POA: Diagnosis not present

## 2022-03-26 DIAGNOSIS — H10413 Chronic giant papillary conjunctivitis, bilateral: Secondary | ICD-10-CM | POA: Diagnosis not present

## 2022-03-26 DIAGNOSIS — H01134 Eczematous dermatitis of left upper eyelid: Secondary | ICD-10-CM | POA: Diagnosis not present

## 2022-03-26 DIAGNOSIS — H01131 Eczematous dermatitis of right upper eyelid: Secondary | ICD-10-CM | POA: Diagnosis not present

## 2022-03-26 DIAGNOSIS — H25813 Combined forms of age-related cataract, bilateral: Secondary | ICD-10-CM | POA: Diagnosis not present

## 2022-03-26 DIAGNOSIS — H04123 Dry eye syndrome of bilateral lacrimal glands: Secondary | ICD-10-CM | POA: Diagnosis not present

## 2022-03-26 DIAGNOSIS — E119 Type 2 diabetes mellitus without complications: Secondary | ICD-10-CM | POA: Diagnosis not present

## 2022-03-31 ENCOUNTER — Telehealth (HOSPITAL_COMMUNITY): Payer: Medicare Other | Admitting: Psychiatry

## 2022-03-31 DIAGNOSIS — Z79899 Other long term (current) drug therapy: Secondary | ICD-10-CM | POA: Diagnosis not present

## 2022-04-09 ENCOUNTER — Telehealth: Payer: Self-pay | Admitting: Neurology

## 2022-04-09 DIAGNOSIS — E559 Vitamin D deficiency, unspecified: Secondary | ICD-10-CM | POA: Diagnosis not present

## 2022-04-09 NOTE — Telephone Encounter (Signed)
PA submitted on [facility] HUT:[id number] Will await determination

## 2022-04-09 NOTE — Telephone Encounter (Signed)
PA approved for the patient Request Reference Number: [id number]. UBRELVY TAB '50MG'$  is approved through 04/06/2023. Your patient may now fill this prescription and it will be covered. Authorization Expiration Date: 04/06/2023

## 2022-04-17 DIAGNOSIS — M19011 Primary osteoarthritis, right shoulder: Secondary | ICD-10-CM | POA: Diagnosis not present

## 2022-04-21 DIAGNOSIS — H25813 Combined forms of age-related cataract, bilateral: Secondary | ICD-10-CM | POA: Diagnosis not present

## 2022-04-21 DIAGNOSIS — H10413 Chronic giant papillary conjunctivitis, bilateral: Secondary | ICD-10-CM | POA: Diagnosis not present

## 2022-04-21 DIAGNOSIS — H04123 Dry eye syndrome of bilateral lacrimal glands: Secondary | ICD-10-CM | POA: Diagnosis not present

## 2022-04-21 DIAGNOSIS — E119 Type 2 diabetes mellitus without complications: Secondary | ICD-10-CM | POA: Diagnosis not present

## 2022-04-24 ENCOUNTER — Other Ambulatory Visit (HOSPITAL_COMMUNITY): Payer: Self-pay | Admitting: *Deleted

## 2022-04-24 ENCOUNTER — Telehealth (HOSPITAL_BASED_OUTPATIENT_CLINIC_OR_DEPARTMENT_OTHER): Payer: 59 | Admitting: Psychiatry

## 2022-04-24 ENCOUNTER — Other Ambulatory Visit (HOSPITAL_COMMUNITY): Payer: Self-pay | Admitting: Psychiatry

## 2022-04-24 DIAGNOSIS — M129 Arthropathy, unspecified: Secondary | ICD-10-CM | POA: Diagnosis not present

## 2022-04-24 DIAGNOSIS — F3342 Major depressive disorder, recurrent, in full remission: Secondary | ICD-10-CM | POA: Diagnosis not present

## 2022-04-24 DIAGNOSIS — I10 Essential (primary) hypertension: Secondary | ICD-10-CM | POA: Diagnosis not present

## 2022-04-24 DIAGNOSIS — R5383 Other fatigue: Secondary | ICD-10-CM | POA: Diagnosis not present

## 2022-04-24 DIAGNOSIS — Z7189 Other specified counseling: Secondary | ICD-10-CM | POA: Diagnosis not present

## 2022-04-24 DIAGNOSIS — Z0000 Encounter for general adult medical examination without abnormal findings: Secondary | ICD-10-CM | POA: Diagnosis not present

## 2022-04-24 DIAGNOSIS — E559 Vitamin D deficiency, unspecified: Secondary | ICD-10-CM | POA: Diagnosis not present

## 2022-04-24 DIAGNOSIS — M545 Low back pain, unspecified: Secondary | ICD-10-CM | POA: Diagnosis not present

## 2022-04-24 DIAGNOSIS — E7800 Pure hypercholesterolemia, unspecified: Secondary | ICD-10-CM | POA: Diagnosis not present

## 2022-04-24 DIAGNOSIS — Z711 Person with feared health complaint in whom no diagnosis is made: Secondary | ICD-10-CM | POA: Diagnosis not present

## 2022-04-24 DIAGNOSIS — Z79899 Other long term (current) drug therapy: Secondary | ICD-10-CM | POA: Diagnosis not present

## 2022-04-24 DIAGNOSIS — F17210 Nicotine dependence, cigarettes, uncomplicated: Secondary | ICD-10-CM | POA: Diagnosis not present

## 2022-04-24 DIAGNOSIS — R0602 Shortness of breath: Secondary | ICD-10-CM | POA: Diagnosis not present

## 2022-04-24 DIAGNOSIS — R7303 Prediabetes: Secondary | ICD-10-CM | POA: Diagnosis not present

## 2022-04-24 MED ORDER — MIRTAZAPINE 30 MG PO TABS: 30.0000 mg | ORAL_TABLET | Freq: Every day | ORAL | 4 refills | Status: DC

## 2022-04-24 MED ORDER — CLONAZEPAM 1 MG PO TABS: ORAL_TABLET | ORAL | 4 refills | Status: DC

## 2022-04-24 MED ORDER — NORTRIPTYLINE HCL 50 MG PO CAPS: ORAL_CAPSULE | ORAL | 4 refills | Status: DC

## 2022-04-24 NOTE — Progress Notes (Signed)
Patient ID: Gloria Lewis, female   DOB: 13-Aug-1955, 67 y.o.   MRN: [id number] [facility] MD Progress Note  04/24/2022 9:25 AM OCTA UPLINGER  MRN:  [id number] Subjective:  Shoulder hurting Principal Problem: Major Depression,recurent Mild Diagnosis: Adjustment disorder with an anxious mood stat  Today the patient was interviewed over the phone.  The patient is doing very well.  She is doing better than usual.  She is going to have cataract surgery soon and she is anxious about that but I think she will be okay.  She has decided not to have her rotator cuff operated on.  She is tired of surgeries.  She says 67 years of age and she will does live with what she has got.  She takes the same attitude about her sleep pattern.  She has had chronic problems with sleep all her life.  Mainly she has problems with interrupted sleep.  Fortunately it does not affect her in a significant way.  She does not take naps and she does not feel sleepy during the day.  Therefore she has no clear evidence of sleep deprivation.  The patient denies being depressed.  Her anxiety is well controlled.  The key stressor that is much improved is her chronically mentally ill son Gloria Lewis is doing well.  He has been compliant and he is cooking and he is looking forward to consider going to back to school for massage therapy.  He is stable when he is stable the patient feels much better.  There is still looking together to find a place to live that they would like better but for the most part things are going well.  The patient's health otherwise is good.  Gloria Lewis has a daughter who is the patient's granddaughter who is 85 years old is doing well and has a lot of contact with both Gloria Lewis and the patient.  The patient is still interested in getting back to school herself.  She has 3 more credits to get her degree.  The patient goes to the gym regularly likes to swim.  Patient denies any use of any alcohol or drugs.  She takes her medicines very  reliable.  I believe the patient is very stable. Patient Active Problem List   Diagnosis Date Noted   Infectious gastroenteritis [A09] 04/16/2016   Xerostomia [K11.7] 03/09/2016   Surgery, elective [Z41.9] 05/23/2015   S/P arthroscopy of shoulder [Z98.890] 05/23/2015   Chronic migraine without aura without status migrainosus, not intractable [G43.709] 10/18/2014   Tobacco abuse [Z72.0] 10/18/2014   Obesity [E66.9] 09/23/2014   COPD [J44.9] 09/02/2014   Pulmonary hypertension ([place]) [I27.20] 08/31/2014   Respiratory failure with hypoxia ([place]) [J96.91] 08/14/2014   Cigarette smoker [F17.210] 07/28/2014   Major depressive disorder, recurrent episode, moderate (HCC) [F33.1] 07/06/2014   Essential hypertension [I10]    SOB (shortness of breath) [R06.02] 06/21/2014   Precordial pain [R07.2] 06/21/2014   Gastroesophageal reflux disease [K21.9] 06/21/2014   Chest pain [R07.9] 06/21/2014   HTN (hypertension) [I10]    Neck pain [M54.2] 01/08/2014   Major depressive disorder, recurrent episode, severe, without mention of psychotic behavior [F33.2] 10/14/2012   Schizoaffective disorder ([place]) [F25.9] 05/26/2012   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] 10/06/2010   Hyperparathyroidism, primary ([place]) [E21.0] 10/06/2010   DEGENERATIVE DISC DISEASE, LUMBOSACRAL SPINE [M51.37] 05/21/2007   DERMATOPHYTOSIS OF THE BODY [B35.4] 05/10/2007   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] 03/24/2007   Anxiety state [F41.1] 03/24/2007   DENTAL PAIN [K08.9] 03/24/2007   SHOULDER PAIN, LEFT [M25.519]  03/24/2007   Total Time spent with patient:30 min  Past Psychiatric History:   Past Medical History:  Past Medical History:  Diagnosis Date   Anginal pain ([place])    admit 06/2014; had non-ischemic stress test   Anxiety    Bipolar 1 disorder (HCC)    Colon polyp    CTS (carpal tunnel syndrome)    Depression    Diabetes mellitus without complication (HCC)    Fever blister    GERD (gastroesophageal reflux disease)    HA  (headache)    HTN (hypertension)    Hypercholesterolemia    Migraines    OA (osteoarthritis)    Schizo-affective psychosis ([place])     Past Surgical History:  Procedure Laterality Date   ANTERIOR CERVICAL DECOMP/DISCECTOMY FUSION  08/27/2011   Procedure: ANTERIOR CERVICAL DECOMPRESSION/DISCECTOMY FUSION 1 LEVEL/HARDWARE REMOVAL;  Surgeon: Eustace Moore, MD;  Location: [facility];  Service: Neurosurgery;  Laterality: Bilateral;  Cervical four-five Anterior cervical decompression/diskectomy, fusion, Plate, Removal of Cervical five-seven Plate   back injection     CARDIAC CATHETERIZATION N/A 11/09/2014   Procedure: Right Heart Cath;  Surgeon: Larey Dresser, MD;  Location: [place] [facility];  Service: Cardiovascular;  Laterality: N/A;   CARPAL TUNNEL RELEASE  20110 rt/lt   rt x2 , lt x1   COLONOSCOPY  06/2017   [facility]   ESOPHAGOGASTRODUODENOSCOPY  06/2017   [place]     MULTIPLE TOOTH EXTRACTIONS     NECK SURGERY  2009   PITUITARY SURGERY     Had gland removed from producing too much calcium   polp removed  2011   RIGHT/LEFT HEART CATH AND CORONARY ANGIOGRAPHY N/A 07/05/2017   Procedure: RIGHT/LEFT HEART CATH AND CORONARY ANGIOGRAPHY;  Surgeon: Larey Dresser, MD;  Location: [place] [facility];  Service: Cardiovascular;  Laterality: N/A;   SHOULDER ARTHROSCOPY WITH ROTATOR CUFF REPAIR Right 05/23/2015   Procedure: RIGHT SHOULDER ARTHROSCOPY WITH REMOVAL OF SUTURE ANCHOR AND POSSIBLE REVISION ROTATOR CUFF REPAIR;  Surgeon: Justice Britain, MD;  Location: [place];  Service: Orthopedics;  Laterality: Right;   SHOULDER ARTHROSCOPY WITH SUBACROMIAL DECOMPRESSION Right 01/24/2015   Procedure: RIGHT SHOULDER ARTHROSCOPY WITH SUBACROMIAL DECOMPRESSION AD DISTAL CLAVICLE RESECTION ;  Surgeon: Justice Britain, MD;  Location: [place];  Service: Orthopedics;  Laterality: Right;   VAGINAL DELIVERY     x3   Family History:  Family History  Problem Relation  Age of Onset   Coronary artery disease Father    Cancer Father        head neck    Esophageal cancer Father    Hypertension Mother    Schizophrenia Mother    Diabetes Mother    Heart attack Mother    Depression Brother    Suicidality Brother    Prostate cancer Brother    Cancer Brother        bone marrow   Schizophrenia Maternal Grandmother    Anesthesia problems Neg Hx    Hypotension Neg Hx    Malignant hyperthermia Neg Hx    Pseudochol deficiency Neg Hx    Allergic rhinitis Neg Hx    Angioedema Neg Hx    Asthma Neg Hx    Atopy Neg Hx    Eczema Neg Hx    Immunodeficiency Neg Hx    Urticaria Neg Hx    Breast cancer Neg Hx    Family Psychiatric  History:  Social History:  Social History  Substance and Sexual Activity  Alcohol Use No   Alcohol/week: 0.0 standard drinks of alcohol     Social History   Substance and Sexual Activity  Drug Use No   Comment: hx crack addiction 2008    Social History   Socioeconomic History   Marital status: Single    Spouse name: Not on file   Number of children: 3   Years of education: 12   Highest education level: Some college, no degree  Occupational History   Occupation: disabled/retired  Tobacco Use   Smoking status: Some Days    Packs/day: 0.10    Years: 30.00    Total pack years: 3.00    Types: Cigarettes   Smokeless tobacco: Never  Vaping Use   Vaping Use: Some days  Substance and Sexual Activity   Alcohol use: No    Alcohol/week: 0.0 standard drinks of alcohol   Drug use: No    Comment: hx crack addiction 2008   Sexual activity: Never  Other Topics Concern   Not on file  Social History Narrative   Lives in a two story home alone      [facility] in [place] for pain management      Right handed   12th grade      Caffeine coffee 1 cup /day   Social Determinants of Health   Financial Resource Strain: Medium Risk (03/12/2017)   Overall Financial Resource Strain (CARDIA)    Difficulty of Paying Living  Expenses: Somewhat hard  Food Insecurity: Food Insecurity Present (03/12/2017)   Hunger Vital Sign    Worried About Running Out of Food in the Last Year: Sometimes true    Ran Out of Food in the Last Year: Sometimes true  Transportation Needs: Unmet Transportation Needs (03/12/2017)   PRAPARE - Transportation    Lack of Transportation (Medical): Yes    Lack of Transportation (Non-Medical): Yes  Physical Activity: Inactive (03/12/2017)   Exercise Vital Sign    Days of Exercise per Week: 0 days    Minutes of Exercise per Session: 0 min  Stress: Stress Concern Present (03/12/2017)   [place]    Feeling of Stress : Rather much  Social Connections: Moderately Integrated (03/12/2017)   Social Connection and Isolation Panel [NHANES]    Frequency of Communication with Friends and Family: More than three times a week    Frequency of Social Gatherings with Friends and Family: More than three times a week    Attends Religious Services: More than 4 times per year    Active Member of [employer] or Organizations: Yes    Attends Music therapist: More than 4 times per year    Marital Status: Never married   Additional Social History:                         Sleep: Good  Appetite:  Fair  Current Medications: Current Outpatient Medications  Medication Sig Dispense Refill   ASHWAGANDHA PO Take 1 capsule by mouth 2 (two) times daily.     azelastine (ASTELIN) 0.1 % nasal spray Place 1 spray into both nostrils 2 (two) times daily.     cetirizine (ZYRTEC ALLERGY) 10 MG tablet Take 1 tablet (10 mg total) by mouth daily. 90 tablet 0   cholecalciferol (VITAMIN D) 1000 UNITS tablet Take 1,000 Units by mouth daily.     clonazePAM (KLONOPIN) 1 MG tablet Take 1 tablet qam &  1 tablet qhs 60 tablet 4   DEXILANT 30 MG capsule Take 1 capsule by mouth daily.     diclofenac Sodium (VOLTAREN) 1 % GEL Apply topically 4 (four) times  daily. (Patient not taking: Reported on 06/04/2021)     diphenhydrAMINE (BENADRYL) 25 MG tablet 1 tablet as needed     Echinacea-Goldenseal LIQD Place 1 drop into the nose daily as needed.     Ginger, Zingiber officinalis, (GINGER EXTRACT PO) Take by mouth.     hydrocortisone (ANUSOL-HC) 2.5 % rectal cream PLACE 1 APPLICATION IN RECTUM 3 TIMES A DAY     lidocaine (LIDODERM) 5 % Place 1 patch onto the skin daily. Remove & Discard patch within 12 hours or as directed by MD 30 patch 0   Magnesium 500 MG TABS 1 tablet     mirtazapine (REMERON) 30 MG tablet Take 1 tablet (30 mg total) by mouth at bedtime. 30 tablet 4   NON FORMULARY 2 (two) times daily. [place]     nortriptyline (PAMELOR) 50 MG capsule 2 qhs 60 capsule 4   Omega-3 Fatty Acids (FISH OIL PO) Take by mouth 2 (two) times a day.     ondansetron (ZOFRAN) 8 MG tablet Take 1 tablet (8 mg total) by mouth every 8 (eight) hours as needed for nausea or vomiting. 20 tablet 3   oxyCODONE-acetaminophen (PERCOCET) 10-325 MG tablet Take 1 tablet by mouth 2 (two) times daily.     potassium chloride SA (K-DUR,KLOR-CON) 20 MEQ tablet Take 20 mEq by mouth 2 (two) times daily.     promethazine-dextromethorphan (PROMETHAZINE-DM) 6.25-15 MG/5ML syrup 5 ml as needed     rosuvastatin (CRESTOR) 10 MG tablet 1 tablet     topiramate (TOPAMAX) 100 MG tablet Take 1 tablet (100 mg total) by mouth 2 (two) times daily. 180 tablet 3   triamterene-hydrochlorothiazide (DYAZIDE) 50-25 MG capsule Take 1 capsule by mouth daily.     TURMERIC PO Take by mouth as needed.     Ubrogepant (UBRELVY) 50 MG TABS Take one tablet onset migraine, may repeat in 2 hours if needed (max 2 tabs/24 hours) 10 tablet 11   valACYclovir (VALTREX) 1000 MG tablet Take 1,000 mg by mouth daily.     vitamin B-12 (CYANOCOBALAMIN) 1000 MCG tablet Take 1,000 mcg by mouth daily.     No current facility-administered medications for this visit.    Lab Results: No results found for this or any previous  visit (from the past 48 hour(s)).  Physical Findings: AIMS:  , ,  ,  ,    CIWA:    COWS:     Musculoskeletal: Strength & Muscle Tone: within normal limits Gait & Station: normal Patient leans: N/A  Psychiatric Specialty Exam: ROS  There were no vitals taken for this visit.There is no height or weight on file to calculate BMI.  General Appearance: Casual  Eye Contact::  Good  Speech:  Clear and Coherent  Volume:  Normal  Mood:  Euthymic  Affect:  Congruent  Thought Process:  Coherent  Orientation:  Full (Time, Place, and Person)  Thought Content:  WDL  Suicidal Thoughts:  No  Homicidal Thoughts:  No  Memory:  NA  Judgement:  Good  Insight:  Fair  Psychomotor Activity:  Normal  Concentration:  Fair  Recall:  Good  Fund of Knowledge:Good  Language: Good  Akathisia:  No  Handed:  Right  AIMS (if indicated):     Assets:   ADL's:  Intact  Cognition: WNL  Sleep:       Treatment Plan  04/24/2022, 9:25 AM   This patient is diagnosed with major depression.  She continues taking nortriptyline 50 mg and Remeron 30 mg.  At this time she is not in any therapy.  [facility]  Her second problem is an adjustment disorder with an anxious mood state.  Patient continues taking Klonopin 1 mg twice daily.  Her third problem is insomnia but she is handling on her own.  Today we reviewed good sleep hygiene and she seems to be doing it.  The patient also has a past history of significant substance abuse but at this time this is not a problem.  This patient she will return to see me in 3 months.

## 2022-04-27 DIAGNOSIS — E119 Type 2 diabetes mellitus without complications: Secondary | ICD-10-CM | POA: Diagnosis not present

## 2022-04-27 DIAGNOSIS — G894 Chronic pain syndrome: Secondary | ICD-10-CM | POA: Diagnosis not present

## 2022-04-27 DIAGNOSIS — R946 Abnormal results of thyroid function studies: Secondary | ICD-10-CM | POA: Diagnosis not present

## 2022-04-27 DIAGNOSIS — E785 Hyperlipidemia, unspecified: Secondary | ICD-10-CM | POA: Diagnosis not present

## 2022-04-27 DIAGNOSIS — F17201 Nicotine dependence, unspecified, in remission: Secondary | ICD-10-CM | POA: Diagnosis not present

## 2022-04-27 DIAGNOSIS — K219 Gastro-esophageal reflux disease without esophagitis: Secondary | ICD-10-CM | POA: Diagnosis not present

## 2022-04-27 DIAGNOSIS — E1169 Type 2 diabetes mellitus with other specified complication: Secondary | ICD-10-CM | POA: Diagnosis not present

## 2022-04-27 DIAGNOSIS — I10 Essential (primary) hypertension: Secondary | ICD-10-CM | POA: Diagnosis not present

## 2022-04-27 DIAGNOSIS — G720 Drug-induced myopathy: Secondary | ICD-10-CM | POA: Diagnosis not present

## 2022-04-28 DIAGNOSIS — H2511 Age-related nuclear cataract, right eye: Secondary | ICD-10-CM | POA: Diagnosis not present

## 2022-05-25 DIAGNOSIS — M545 Low back pain, unspecified: Secondary | ICD-10-CM | POA: Diagnosis not present

## 2022-05-25 DIAGNOSIS — I10 Essential (primary) hypertension: Secondary | ICD-10-CM | POA: Diagnosis not present

## 2022-05-25 DIAGNOSIS — R0602 Shortness of breath: Secondary | ICD-10-CM | POA: Diagnosis not present

## 2022-05-25 DIAGNOSIS — N183 Chronic kidney disease, stage 3 unspecified: Secondary | ICD-10-CM | POA: Diagnosis not present

## 2022-05-25 DIAGNOSIS — F17210 Nicotine dependence, cigarettes, uncomplicated: Secondary | ICD-10-CM | POA: Diagnosis not present

## 2022-05-25 DIAGNOSIS — E7800 Pure hypercholesterolemia, unspecified: Secondary | ICD-10-CM | POA: Diagnosis not present

## 2022-05-25 DIAGNOSIS — E559 Vitamin D deficiency, unspecified: Secondary | ICD-10-CM | POA: Diagnosis not present

## 2022-05-25 DIAGNOSIS — R7303 Prediabetes: Secondary | ICD-10-CM | POA: Diagnosis not present

## 2022-05-25 DIAGNOSIS — M25562 Pain in left knee: Secondary | ICD-10-CM | POA: Diagnosis not present

## 2022-05-25 DIAGNOSIS — Z1231 Encounter for screening mammogram for malignant neoplasm of breast: Secondary | ICD-10-CM | POA: Diagnosis not present

## 2022-05-25 DIAGNOSIS — Z79899 Other long term (current) drug therapy: Secondary | ICD-10-CM | POA: Diagnosis not present

## 2022-06-04 NOTE — Progress Notes (Deleted)
No chief complaint on file.    HISTORY OF PRESENT ILLNESS:  06/04/22 ALL:  Gloria Lewis returns for follow up for migraines. She continues topiramate '100mg'$  twice daily and Ubrely as needed.   06/04/2021 ALL: Gloria Lewis is a 67 y.o. female here today for follow up for  migraines. She continues Topiramate '100mg'$  twice daily and Ubrelvy as needed. Her psychiatrist had started her on gabapentin and nortriptyline. She endorses that she is taking the nortriptyline at night, but not taking the gabapentin. At this time she endorses having less than 5 headache days a month. She reports that she is working out 4x/week at [employer] with her son.  She is sleeping better. Her BP is elevated today but she reports that her numbers are better at home an she is taking her blood pressure medications regularly.    HISTORY (copied from previous note) 11/28/20 ALL: Gloria Lewis returns for follow up for migraines with aura. She continues topiramate '100mg'$  BID. Review of last rx date shows 11/25/2019 for 60 tablets wit6 refills. She is adamant that she takes this twice daily. Roselyn Meier works for abortive therapy. therapy that works well. She reports nortriptyline '50mg'$  was discontinued by previous psychiatrist but recently restarted by new psychiatrist. She tolerated it well in the past. Gabapentin was recently added for anxiety and restless legs by new psychiatrist. She has been afraid to start this medication. She is having about 20 headache days, 3 migraines. Roselyn Meier works well for abortive therapy. She reports being seen regularly by PCP.    02/14/2020 ALL:  Gloria Lewis is a 67 y.o. female here today for follow up for migraine with aura. She was restarted on topiramate '50mg'$  BID and Nurtec PRN. Dose was increased to '100mg'$  tiwice daily. She has continued nortriptyline '50mg'$  at bedtime prescribed by psychiatry. She is also taking Valium '5mg'$  BID and Remeron '30mg'$  daily for mood management. She feels that headaches wax and  wane. She reports having 22 headache days a month. She has tried Nurtec about 6 times and felt it worsened Acid Reflux. It was effective in abortion therapy. She is taking Dexilant '30mg'$  daily for GERD.     MRI showed artery tortuosity most likely from HTN and small vessel disease. On Dyazide and fish oil, no statin. She has talked to PCP about having MRA/CTA but has decided to wait at this time. She has been working on diet changes over the past 3 years. She has lost about 60 pounds. She reports huge amounts of stress. She lives in an apartment complex. She is having a hard time with her landlord. She is not sleeping well. She tries to sleep when her neighbors are awake to avoid conflicts. She does not have any other place she can live. [place] [address]. She feels that this contributes to her headaches. She is seeing psychiatrist every 3 months. She is walking at least 5 days a week. She does not snore. No daytime sleepiness.     REVIEW OF SYSTEMS: Out of a complete 14 system review of symptoms, the patient complains only of the following symptoms, headaches and all other reviewed systems are negative.   ALLERGIES: Allergies  Allergen Reactions   Effexor [Venlafaxine Hydrochloride] Itching and Other (See Comments)    headache   Latex Itching and Rash    Other reaction(s): Unknown   Penicillins Hives    Has patient had a PCN reaction causing immediate rash, facial/tongue/throat swelling, SOB or lightheadedness with hypotension: Yes  Has patient had a PCN reaction causing severe rash involving mucus membranes or skin necrosis: No Has patient had a PCN reaction that required hospitalization No Has patient had a PCN reaction occurring within the last 10 years: No If all of the above answers are "NO", then may proceed with Cephalosporin use.    Zithromax [Azithromycin Dihydrate] Swelling   Amlodipine Swelling   Atorvastatin Swelling    Muscle aches   Butrans [Buprenorphine] Other (See  Comments)    Ulcers-"mouth would not heal"   Codeine Itching    Tolerable with benadryl   Nucynta [Tapentadol] Other (See Comments)    Ulcers inside of mouth   Nurtec [Rimegepant Sulfate] Nausea Only   Other     Other reaction(s): nausea Other reaction(s): itching Other reaction(s): itch Other reaction(s): HA Other reaction(s): Unknown   Pravastatin Sodium     Other reaction(s): itching   Propranolol Hcl     Other reaction(s): dyspnea   Sumatriptan     Other reaction(s): Unknown   Venlafaxine     Other reaction(s): HA   Vicodin [Hydrocodone-Acetaminophen] Itching   Amoxicillin Itching    Other reaction(s): itching   Chantix [Varenicline Tartrate] Nausea Only   Hydrocodone Itching    Tolerable with benadryl Other reaction(s): itch   Paroxetine Hcl Other (See Comments)    headache   Tramadol Other (See Comments)    Pt states it interacted with her sertraline, but she is no longer on sertraline.  She does not remember the type of reaction she had.  Other reaction(s): Fainted     HOME MEDICATIONS: Outpatient Medications Prior to Visit  Medication Sig Dispense Refill   ASHWAGANDHA PO Take 1 capsule by mouth 2 (two) times daily.     azelastine (ASTELIN) 0.1 % nasal spray Place 1 spray into both nostrils 2 (two) times daily.     cetirizine (ZYRTEC ALLERGY) 10 MG tablet Take 1 tablet (10 mg total) by mouth daily. 90 tablet 0   cholecalciferol (VITAMIN D) 1000 UNITS tablet Take 1,000 Units by mouth daily.     clonazePAM (KLONOPIN) 1 MG tablet Take 1 tablet qam & 1 tablet qhs 60 tablet 4   DEXILANT 30 MG capsule Take 1 capsule by mouth daily.     diclofenac Sodium (VOLTAREN) 1 % GEL Apply topically 4 (four) times daily. (Patient not taking: Reported on 06/04/2021)     diphenhydrAMINE (BENADRYL) 25 MG tablet 1 tablet as needed     Echinacea-Goldenseal LIQD Place 1 drop into the nose daily as needed.     Ginger, Zingiber officinalis, (GINGER EXTRACT PO) Take by mouth.      hydrocortisone (ANUSOL-HC) 2.5 % rectal cream PLACE 1 APPLICATION IN RECTUM 3 TIMES A DAY     lidocaine (LIDODERM) 5 % Place 1 patch onto the skin daily. Remove & Discard patch within 12 hours or as directed by MD 30 patch 0   Magnesium 500 MG TABS 1 tablet     mirtazapine (REMERON) 30 MG tablet Take 1 tablet (30 mg total) by mouth at bedtime. 30 tablet 4   NON FORMULARY 2 (two) times daily. [place]     nortriptyline (PAMELOR) 50 MG capsule 2 qhs 60 capsule 4   Omega-3 Fatty Acids (FISH OIL PO) Take by mouth 2 (two) times a day.     ondansetron (ZOFRAN) 8 MG tablet Take 1 tablet (8 mg total) by mouth every 8 (eight) hours as needed for nausea or vomiting. 20 tablet 3   oxyCODONE-acetaminophen (PERCOCET)  10-325 MG tablet Take 1 tablet by mouth 2 (two) times daily.     potassium chloride SA (K-DUR,KLOR-CON) 20 MEQ tablet Take 20 mEq by mouth 2 (two) times daily.     promethazine-dextromethorphan (PROMETHAZINE-DM) 6.25-15 MG/5ML syrup 5 ml as needed     rosuvastatin (CRESTOR) 10 MG tablet 1 tablet     topiramate (TOPAMAX) 100 MG tablet Take 1 tablet (100 mg total) by mouth 2 (two) times daily. 180 tablet 3   triamterene-hydrochlorothiazide (DYAZIDE) 50-25 MG capsule Take 1 capsule by mouth daily.     TURMERIC PO Take by mouth as needed.     Ubrogepant (UBRELVY) 50 MG TABS Take one tablet onset migraine, may repeat in 2 hours if needed (max 2 tabs/24 hours) 10 tablet 11   valACYclovir (VALTREX) 1000 MG tablet Take 1,000 mg by mouth daily.     vitamin B-12 (CYANOCOBALAMIN) 1000 MCG tablet Take 1,000 mcg by mouth daily.     No facility-administered medications prior to visit.     PAST MEDICAL HISTORY: Past Medical History:  Diagnosis Date   Anginal pain ([place])    admit 06/2014; had non-ischemic stress test   Anxiety    Bipolar 1 disorder (HCC)    Colon polyp    CTS (carpal tunnel syndrome)    Depression    Diabetes mellitus without complication (HCC)    Fever blister    GERD  (gastroesophageal reflux disease)    HA (headache)    HTN (hypertension)    Hypercholesterolemia    Migraines    OA (osteoarthritis)    Schizo-affective psychosis ([place])      PAST SURGICAL HISTORY: Past Surgical History:  Procedure Laterality Date   ANTERIOR CERVICAL DECOMP/DISCECTOMY FUSION  08/27/2011   Procedure: ANTERIOR CERVICAL DECOMPRESSION/DISCECTOMY FUSION 1 LEVEL/HARDWARE REMOVAL;  Surgeon: Eustace Moore, MD;  Location: [facility];  Service: Neurosurgery;  Laterality: Bilateral;  Cervical four-five Anterior cervical decompression/diskectomy, fusion, Plate, Removal of Cervical five-seven Plate   back injection     CARDIAC CATHETERIZATION N/A 11/09/2014   Procedure: Right Heart Cath;  Surgeon: Larey Dresser, MD;  Location: [place] [facility] [facility];  Service: Cardiovascular;  Laterality: N/A;   CARPAL TUNNEL RELEASE  20110 rt/lt   rt x2 , lt x1   COLONOSCOPY  06/2017   [facility]   ESOPHAGOGASTRODUODENOSCOPY  06/2017   [place] [place]     MULTIPLE TOOTH EXTRACTIONS     NECK SURGERY  2009   PITUITARY SURGERY     Had gland removed from producing too much calcium   polp removed  2011   RIGHT/LEFT HEART CATH AND CORONARY ANGIOGRAPHY N/A 07/05/2017   Procedure: RIGHT/LEFT HEART CATH AND CORONARY ANGIOGRAPHY;  Surgeon: Larey Dresser, MD;  Location: [place] [facility];  Service: Cardiovascular;  Laterality: N/A;   SHOULDER ARTHROSCOPY WITH ROTATOR CUFF REPAIR Right 05/23/2015   Procedure: RIGHT SHOULDER ARTHROSCOPY WITH REMOVAL OF SUTURE ANCHOR AND POSSIBLE REVISION ROTATOR CUFF REPAIR;  Surgeon: Justice Britain, MD;  Location: [place];  Service: Orthopedics;  Laterality: Right;   SHOULDER ARTHROSCOPY WITH SUBACROMIAL DECOMPRESSION Right 01/24/2015   Procedure: RIGHT SHOULDER ARTHROSCOPY WITH SUBACROMIAL DECOMPRESSION AD DISTAL CLAVICLE RESECTION ;  Surgeon: Justice Britain, MD;  Location: [place];  Service: Orthopedics;  Laterality: Right;   VAGINAL  DELIVERY     x3     FAMILY HISTORY: Family History  Problem Relation Age of Onset   Coronary artery disease Father    Cancer  Father        head neck    Esophageal cancer Father    Hypertension Mother    Schizophrenia Mother    Diabetes Mother    Heart attack Mother    Depression Brother    Suicidality Brother    Prostate cancer Brother    Cancer Brother        bone marrow   Schizophrenia Maternal Grandmother    Anesthesia problems Neg Hx    Hypotension Neg Hx    Malignant hyperthermia Neg Hx    Pseudochol deficiency Neg Hx    Allergic rhinitis Neg Hx    Angioedema Neg Hx    Asthma Neg Hx    Atopy Neg Hx    Eczema Neg Hx    Immunodeficiency Neg Hx    Urticaria Neg Hx    Breast cancer Neg Hx      SOCIAL HISTORY: Social History   Socioeconomic History   Marital status: Single    Spouse name: Not on file   Number of children: 3   Years of education: 12   Highest education level: Some college, no degree  Occupational History   Occupation: disabled/retired  Tobacco Use   Smoking status: Some Days    Packs/day: 0.10    Years: 30.00    Total pack years: 3.00    Types: Cigarettes   Smokeless tobacco: Never  Vaping Use   Vaping Use: Some days  Substance and Sexual Activity   Alcohol use: No    Alcohol/week: 0.0 standard drinks of alcohol   Drug use: No    Comment: hx crack addiction 2008   Sexual activity: Never  Other Topics Concern   Not on file  Social History Narrative   Lives in a two story home alone      [facility] [place] [facility] pain management      Right handed   12th grade      Caffeine coffee 1 cup /day   Social Determinants of Health   Financial Resource Strain: Medium Risk (03/12/2017)   Overall Financial Resource Strain (CARDIA)    Difficulty of Paying Living Expenses: Somewhat hard  Food Insecurity: Food Insecurity Present (03/12/2017)   Hunger Vital Sign    Worried About Running Out of Food in the Last Year: Sometimes true    Ran  Out of Food in the Last Year: Sometimes true  Transportation Needs: Unmet Transportation Needs (03/12/2017)   PRAPARE - Transportation    Lack of Transportation (Medical): Yes    Lack of Transportation (Non-Medical): Yes  Physical Activity: Inactive (03/12/2017)   Exercise Vital Sign    Days of Exercise per Week: 0 days    Minutes of Exercise per Session: 0 min  Stress: Stress Concern Present (03/12/2017)   [place]    Feeling of Stress : Rather much  Social Connections: Moderately Integrated (03/12/2017)   Social Connection and Isolation Panel [NHANES]    Frequency of Communication with Friends and Family: More than three times a week    Frequency of Social Gatherings with Friends and Family: More than three times a week    Attends Religious Services: More than 4 times per year    Active Member of [employer] or Organizations: Yes    Attends Archivist Meetings: More than 4 times per year    Marital Status: Never married  Intimate Partner Violence: Not At Risk (03/12/2017)   Humiliation, Afraid, Rape, and  Kick questionnaire    Fear of Current or Ex-Partner: No    Emotionally Abused: No    Physically Abused: No    Sexually Abused: No     PHYSICAL EXAM  There were no vitals filed for this visit.  There is no height or weight on file to calculate BMI.  Generalized: Well developed, in no acute distress  Cardiology: normal rate and rhythm, no murmur auscultated  Respiratory: clear to auscultation bilaterally    Neurological examination  Mentation: Alert oriented to time, place, history taking. Follows all commands speech and language fluent Cranial nerve II-XII: Pupils were equal round reactive to light. Extraocular movements were full, visual field were full on confrontational test. Facial sensation and strength were normal. Uvula tongue midline. Head turning and shoulder shrug  were normal and  symmetric. Motor: The motor testing reveals 5 over 5 strength of all 4 extremities. Good symmetric motor tone is noted throughout.  Sensory: Sensory testing is intact to soft touch on all 4 extremities. No evidence of extinction is noted.  Coordination: Cerebellar testing reveals good finger-nose-finger and heel-to-shin bilaterally.  Gait and station: Gait is normal.  Reflexes: Deep tendon reflexes are symmetric and normal bilaterally.    DIAGNOSTIC DATA (LABS, IMAGING, TESTING) - I reviewed patient records, labs, notes, testing and imaging myself where available.  Lab Results  Component Value Date   WBC 7.0 09/15/2019   HGB 14.1 09/15/2019   HCT 42.6 09/15/2019   MCV 86.2 09/15/2019   PLT 381 09/15/2019      Component Value Date/Time   NA 139 09/15/2019 1256   K 3.7 09/15/2019 1256   CL 101 09/15/2019 1256   CO2 27 09/15/2019 1256   GLUCOSE 97 09/15/2019 1256   BUN 19 09/15/2019 1256   CREATININE 0.97 09/15/2019 1256   CALCIUM 9.6 09/15/2019 1256   PROT 6.8 11/06/2017 1147   ALBUMIN 3.7 11/06/2017 1147   AST 32 11/06/2017 1147   ALT 20 11/06/2017 1147   ALKPHOS 104 11/06/2017 1147   BILITOT 0.7 11/06/2017 1147   GFRNONAA >60 09/15/2019 1256   GFRAA >60 09/15/2019 1256   Lab Results  Component Value Date   CHOL 176 08/11/2017   HDL 43 08/11/2017   LDLCALC 95 08/11/2017   TRIG 189 (H) 08/11/2017   CHOLHDL 4.1 08/11/2017   Lab Results  Component Value Date   HGBA1C 6.2 (H) 08/11/2017   Lab Results  Component Value Date   VITAMINB12 473 08/11/2017   Lab Results  Component Value Date   TSH 1.015 08/11/2017        No data to display               No data to display           ASSESSMENT AND PLAN  67 y.o. year old female  has a past medical history of Anginal pain ([place]), Anxiety, Bipolar 1 disorder ([place]), Colon polyp, CTS (carpal tunnel syndrome), Depression, Diabetes mellitus without complication ([place]), Fever blister, GERD (gastroesophageal  reflux disease), HA (headache), HTN (hypertension), Hypercholesterolemia, Migraines, OA (osteoarthritis), and Schizo-affective psychosis ([place]). here with    No diagnosis found.  Canaan is doing well and migraines are well managed. She will continue topiramate 100 mg twice daily and Ubrelvy as needed for abortive therapy. She will continue to follow closely with primary care provider and psychiatrist for routine lab work. Follow up with me in 1 year or sooner if needed.  No orders of the defined types were  placed in this encounter.    No orders of the defined types were placed in this encounter.     Debbora Presto, MSN, FNP-C 06/04/2022, 2:38 PM  [facility] [address], [place], [place] [address] [phone]

## 2022-06-08 ENCOUNTER — Encounter: Payer: Self-pay | Admitting: Family Medicine

## 2022-06-08 ENCOUNTER — Ambulatory Visit: Payer: 59 | Admitting: Family Medicine

## 2022-06-11 DIAGNOSIS — J3089 Other allergic rhinitis: Secondary | ICD-10-CM | POA: Diagnosis not present

## 2022-06-11 DIAGNOSIS — G43901 Migraine, unspecified, not intractable, with status migrainosus: Secondary | ICD-10-CM | POA: Diagnosis not present

## 2022-06-19 ENCOUNTER — Other Ambulatory Visit (HOSPITAL_COMMUNITY): Payer: Self-pay | Admitting: *Deleted

## 2022-06-19 ENCOUNTER — Telehealth (HOSPITAL_COMMUNITY): Payer: Self-pay | Admitting: Psychiatry

## 2022-06-19 MED ORDER — CLONAZEPAM 1 MG PO TABS: ORAL_TABLET | ORAL | 4 refills | Status: DC

## 2022-06-19 MED ORDER — MIRTAZAPINE 30 MG PO TABS: 30.0000 mg | ORAL_TABLET | Freq: Every day | ORAL | 1 refills | Status: DC

## 2022-06-19 NOTE — Telephone Encounter (Signed)
Patient called in stating that she called last week to have her Temazepam refilled and she would also like her Klonopin dosage increased.

## 2022-06-19 NOTE — Telephone Encounter (Signed)
Just got VM. Thank you!

## 2022-06-24 DIAGNOSIS — M5136 Other intervertebral disc degeneration, lumbar region: Secondary | ICD-10-CM | POA: Diagnosis not present

## 2022-06-24 DIAGNOSIS — M545 Low back pain, unspecified: Secondary | ICD-10-CM | POA: Diagnosis not present

## 2022-06-24 DIAGNOSIS — Z87891 Personal history of nicotine dependence: Secondary | ICD-10-CM | POA: Diagnosis not present

## 2022-06-24 DIAGNOSIS — M25562 Pain in left knee: Secondary | ICD-10-CM | POA: Diagnosis not present

## 2022-06-24 DIAGNOSIS — R0602 Shortness of breath: Secondary | ICD-10-CM | POA: Diagnosis not present

## 2022-06-26 DIAGNOSIS — E559 Vitamin D deficiency, unspecified: Secondary | ICD-10-CM | POA: Diagnosis not present

## 2022-06-26 DIAGNOSIS — F17210 Nicotine dependence, cigarettes, uncomplicated: Secondary | ICD-10-CM | POA: Diagnosis not present

## 2022-06-26 DIAGNOSIS — Z79899 Other long term (current) drug therapy: Secondary | ICD-10-CM | POA: Diagnosis not present

## 2022-06-26 DIAGNOSIS — R7303 Prediabetes: Secondary | ICD-10-CM | POA: Diagnosis not present

## 2022-06-26 DIAGNOSIS — E7800 Pure hypercholesterolemia, unspecified: Secondary | ICD-10-CM | POA: Diagnosis not present

## 2022-06-26 DIAGNOSIS — E538 Deficiency of other specified B group vitamins: Secondary | ICD-10-CM | POA: Diagnosis not present

## 2022-06-26 DIAGNOSIS — M25562 Pain in left knee: Secondary | ICD-10-CM | POA: Diagnosis not present

## 2022-06-26 DIAGNOSIS — I10 Essential (primary) hypertension: Secondary | ICD-10-CM | POA: Diagnosis not present

## 2022-06-26 DIAGNOSIS — M545 Low back pain, unspecified: Secondary | ICD-10-CM | POA: Diagnosis not present

## 2022-06-26 DIAGNOSIS — N183 Chronic kidney disease, stage 3 unspecified: Secondary | ICD-10-CM | POA: Diagnosis not present

## 2022-06-30 DIAGNOSIS — Z79899 Other long term (current) drug therapy: Secondary | ICD-10-CM | POA: Diagnosis not present

## 2022-07-06 ENCOUNTER — Telehealth (HOSPITAL_COMMUNITY): Payer: Self-pay | Admitting: *Deleted

## 2022-07-06 NOTE — Telephone Encounter (Signed)
Pt called requesting refill of her "sleeping medicine". Writer asked if she was referring to the Remeron 30 mg QHS or the Klonopin 1 mg 1 QD and 2 QHS. Both medications sent to [facility] on 06/19/22. Pt stated it was neither one of those and the Remeron was not for sleep but for her 'stomach'. Pt said it was Restoril 15 mg 1 QHS but she has to take 2 tabs since she just moved. Last send date looks to be 10/28/21. Pt has an upcoming appointment on 07/28/22. Please review.

## 2022-07-09 DIAGNOSIS — Z23 Encounter for immunization: Secondary | ICD-10-CM | POA: Diagnosis not present

## 2022-07-27 DIAGNOSIS — N183 Chronic kidney disease, stage 3 unspecified: Secondary | ICD-10-CM | POA: Diagnosis not present

## 2022-07-27 DIAGNOSIS — M545 Low back pain, unspecified: Secondary | ICD-10-CM | POA: Diagnosis not present

## 2022-07-27 DIAGNOSIS — Z79899 Other long term (current) drug therapy: Secondary | ICD-10-CM | POA: Diagnosis not present

## 2022-07-27 DIAGNOSIS — E7800 Pure hypercholesterolemia, unspecified: Secondary | ICD-10-CM | POA: Diagnosis not present

## 2022-07-27 DIAGNOSIS — G8929 Other chronic pain: Secondary | ICD-10-CM | POA: Diagnosis not present

## 2022-07-27 DIAGNOSIS — Z1231 Encounter for screening mammogram for malignant neoplasm of breast: Secondary | ICD-10-CM | POA: Diagnosis not present

## 2022-07-27 DIAGNOSIS — F17210 Nicotine dependence, cigarettes, uncomplicated: Secondary | ICD-10-CM | POA: Diagnosis not present

## 2022-07-27 DIAGNOSIS — R7303 Prediabetes: Secondary | ICD-10-CM | POA: Diagnosis not present

## 2022-07-27 DIAGNOSIS — E559 Vitamin D deficiency, unspecified: Secondary | ICD-10-CM | POA: Diagnosis not present

## 2022-07-27 DIAGNOSIS — E538 Deficiency of other specified B group vitamins: Secondary | ICD-10-CM | POA: Diagnosis not present

## 2022-07-27 DIAGNOSIS — I10 Essential (primary) hypertension: Secondary | ICD-10-CM | POA: Diagnosis not present

## 2022-07-28 ENCOUNTER — Ambulatory Visit (HOSPITAL_COMMUNITY): Payer: 59 | Admitting: Psychiatry

## 2022-07-29 ENCOUNTER — Ambulatory Visit (HOSPITAL_BASED_OUTPATIENT_CLINIC_OR_DEPARTMENT_OTHER): Payer: 59 | Admitting: Psychiatry

## 2022-07-29 DIAGNOSIS — F324 Major depressive disorder, single episode, in partial remission: Secondary | ICD-10-CM

## 2022-07-29 MED ORDER — DOXEPIN HCL 25 MG PO CAPS: 25.0000 mg | ORAL_CAPSULE | Freq: Every day | ORAL | 6 refills | Status: DC

## 2022-07-29 MED ORDER — NORTRIPTYLINE HCL 50 MG PO CAPS: ORAL_CAPSULE | ORAL | 4 refills | Status: DC

## 2022-07-29 MED ORDER — MIRTAZAPINE 30 MG PO TABS: 30.0000 mg | ORAL_TABLET | Freq: Every day | ORAL | 5 refills | Status: DC

## 2022-07-29 MED ORDER — CLONAZEPAM 1 MG PO TABS: ORAL_TABLET | ORAL | 4 refills | Status: DC

## 2022-07-29 MED ORDER — CLONAZEPAM 1 MG PO TABS: ORAL_TABLET | ORAL | 3 refills | Status: DC

## 2022-07-29 NOTE — Progress Notes (Signed)
Patient ID: Gloria Lewis, female   DOB: 02/09/1956, 67 y.o.   MRN: [id number] [facility] MD Progress Note  07/29/2022 1:41 PM SIRENITY SHEW  MRN:  [id number] Subjective:  Shoulder hurting Principal Problem: Major Depression,recurent Mild Diagnosis: Adjustment disorder with an anxious mood stat       Today the patient is doing actually very well.  She has moved into a new apartment.  Financially she is a place that she feels comfortable with.  Also her son who is chronically mentally ill, lives with her.  The patient did have cataract surgery and is still having some problems with it.  Other than that her health is good.  She continues to swim on a daily basis.  Her only complaint is that she does not get to sleep through the night very well.  On her last visit we discontinued her Restoril.  The patient denies daily depression.  She denies use of alcohol.  She denies use of any illicit drugs.  Today she had a drug screen that was essentially normal.  Patient has no evidence of psychosis.  Patient is very active.  She is still considering going back to college.  She takes her medicines just as prescribed. Patient Active Problem List   Diagnosis Date Noted   Infectious gastroenteritis [A09] 04/16/2016   Xerostomia [K11.7] 03/09/2016   Surgery, elective [Z41.9] 05/23/2015   S/P arthroscopy of shoulder [Z98.890] 05/23/2015   Chronic migraine without aura without status migrainosus, not intractable [G43.709] 10/18/2014   Tobacco abuse [Z72.0] 10/18/2014   Obesity [E66.9] 09/23/2014   COPD [J44.9] 09/02/2014   Pulmonary hypertension [I27.20] 08/31/2014   Respiratory failure with hypoxia [J96.91] 08/14/2014   Cigarette smoker [F17.210] 07/28/2014   Major depressive disorder, recurrent episode, moderate [F33.1] 07/06/2014   Essential hypertension [I10]    SOB (shortness of breath) [R06.02] 06/21/2014   Precordial pain [R07.2] 06/21/2014   Gastroesophageal reflux disease [K21.9] 06/21/2014    Chest pain [R07.9] 06/21/2014   HTN (hypertension) [I10]    Neck pain [M54.2] 01/08/2014   Major depressive disorder, recurrent episode, severe, without mention of psychotic behavior [F33.2] 10/14/2012   Schizoaffective disorder [F25.9] 05/26/2012   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] 10/06/2010   Hyperparathyroidism, primary [E21.0] 10/06/2010   DEGENERATIVE DISC DISEASE, LUMBOSACRAL SPINE [M51.37] 05/21/2007   DERMATOPHYTOSIS OF THE BODY [B35.4] 05/10/2007   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] 03/24/2007   Anxiety state [F41.1] 03/24/2007   DENTAL PAIN [K08.9] 03/24/2007   SHOULDER PAIN, LEFT [M25.519] 03/24/2007   Total Time spent with patient:30 min  Past Psychiatric History:   Past Medical History:  Past Medical History:  Diagnosis Date   Anginal pain (HCC)    admit 06/2014; had non-ischemic stress test   Anxiety    Bipolar 1 disorder (HCC)    Colon polyp    CTS (carpal tunnel syndrome)    Depression    Diabetes mellitus without complication (HCC)    Fever blister    GERD (gastroesophageal reflux disease)    HA (headache)    HTN (hypertension)    Hypercholesterolemia    Migraines    OA (osteoarthritis)    Schizo-affective psychosis (HCC)     Past Surgical History:  Procedure Laterality Date   ANTERIOR CERVICAL DECOMP/DISCECTOMY FUSION  08/27/2011   Procedure: ANTERIOR CERVICAL DECOMPRESSION/DISCECTOMY FUSION 1 LEVEL/HARDWARE REMOVAL;  Surgeon: Tia Alert, MD;  Location: [facility] NEURO ORS;  Service: Neurosurgery;  Laterality: Bilateral;  Cervical four-five Anterior cervical decompression/diskectomy, fusion, Plate, Removal of Cervical five-seven Plate  back injection     CARDIAC CATHETERIZATION N/A 11/09/2014   Procedure: Right Heart Cath;  Surgeon: Laurey Morale, MD;  Location: [facility] INVASIVE CV LAB;  Service: Cardiovascular;  Laterality: N/A;   CARPAL TUNNEL RELEASE  20110 rt/lt   rt x2 , lt x1   COLONOSCOPY  06/2017   [facility]   ESOPHAGOGASTRODUODENOSCOPY  06/2017    [facility]   HEMORRHOID SURGERY     MULTIPLE TOOTH EXTRACTIONS     NECK SURGERY  2009   PITUITARY SURGERY     Had gland removed from producing too much calcium   polp removed  2011   RIGHT/LEFT HEART CATH AND CORONARY ANGIOGRAPHY N/A 07/05/2017   Procedure: RIGHT/LEFT HEART CATH AND CORONARY ANGIOGRAPHY;  Surgeon: Laurey Morale, MD;  Location: [facility] INVASIVE CV LAB;  Service: Cardiovascular;  Laterality: N/A;   SHOULDER ARTHROSCOPY WITH ROTATOR CUFF REPAIR Right 05/23/2015   Procedure: RIGHT SHOULDER ARTHROSCOPY WITH REMOVAL OF SUTURE ANCHOR AND POSSIBLE REVISION ROTATOR CUFF REPAIR;  Surgeon: Francena Hanly, MD;  Location: [facility] OR;  Service: Orthopedics;  Laterality: Right;   SHOULDER ARTHROSCOPY WITH SUBACROMIAL DECOMPRESSION Right 01/24/2015   Procedure: RIGHT SHOULDER ARTHROSCOPY WITH SUBACROMIAL DECOMPRESSION AD DISTAL CLAVICLE RESECTION ;  Surgeon: Francena Hanly, MD;  Location: [facility] OR;  Service: Orthopedics;  Laterality: Right;   VAGINAL DELIVERY     x3   Family History:  Family History  Problem Relation Age of Onset   Coronary artery disease Father    Cancer Father        head neck    Esophageal cancer Father    Hypertension Mother    Schizophrenia Mother    Diabetes Mother    Heart attack Mother    Depression Brother    Suicidality Brother    Prostate cancer Brother    Cancer Brother        bone marrow   Schizophrenia Maternal Grandmother    Anesthesia problems Neg Hx    Hypotension Neg Hx    Malignant hyperthermia Neg Hx    Pseudochol deficiency Neg Hx    Allergic rhinitis Neg Hx    Angioedema Neg Hx    Asthma Neg Hx    Atopy Neg Hx    Eczema Neg Hx    Immunodeficiency Neg Hx    Urticaria Neg Hx    Breast cancer Neg Hx    Family Psychiatric  History:  Social History:  Social History   Substance and Sexual Activity  Alcohol Use No   Alcohol/week: 0.0 standard drinks of alcohol     Social History   Substance and Sexual Activity  Drug Use No    Comment: hx crack addiction 2008    Social History   Socioeconomic History   Marital status: Single    Spouse name: Not on file   Number of children: 3   Years of education: 12   Highest education level: Some college, no degree  Occupational History   Occupation: disabled/retired  Tobacco Use   Smoking status: Some Days    Packs/day: 0.10    Years: 30.00    Additional pack years: 0.00    Total pack years: 3.00    Types: Cigarettes   Smokeless tobacco: Never  Vaping Use   Vaping Use: Some days  Substance and Sexual Activity   Alcohol use: No    Alcohol/week: 0.0 standard drinks of alcohol   Drug use: No    Comment: hx crack addiction 2008  Sexual activity: Never  Other Topics Concern   Not on file  Social History Narrative   Lives in a two story home alone      [facility] [place] [facility]      Right handed   12th grade      Caffeine coffee 1 cup /day   Social Determinants of Health   Financial Resource Strain: Medium Risk (03/12/2017)   Overall Financial Resource Strain (CARDIA)    Difficulty of Paying Living Expenses: Somewhat hard  Food Insecurity: Food Insecurity Present (03/12/2017)   Hunger Vital Sign    Worried About Running Out of Food in the Last Year: Sometimes true    Ran Out of Food in the Last Year: Sometimes true  Transportation Needs: Unmet Transportation Needs (03/12/2017)   PRAPARE - Administrator, Civil Service (Medical): Yes    Lack of Transportation (Non-Medical): Yes  Physical Activity: Inactive (03/12/2017)   Exercise Vital Sign    Days of Exercise per Week: 0 days    Minutes of Exercise per Session: 0 min  Stress: Stress Concern Present (03/12/2017)   [employer] of Occupational Health - Occupational Stress Questionnaire    Feeling of Stress : Rather much  Social Connections: Moderately Integrated (03/12/2017)   Social Connection and Isolation Panel [NHANES]    Frequency of Communication with Friends and  Family: More than three times a week    Frequency of Social Gatherings with Friends and Family: More than three times a week    Attends Religious Services: More than 4 times per year    Active Member of [employer] or Organizations: Yes    Attends Engineer, structural: More than 4 times per year    Marital Status: Never married   Additional Social History:                         Sleep: Good  Appetite:  Fair  Current Medications: Current Outpatient Medications  Medication Sig Dispense Refill   ASHWAGANDHA PO Take 1 capsule by mouth 2 (two) times daily.     azelastine (ASTELIN) 0.1 % nasal spray Place 1 spray into both nostrils 2 (two) times daily.     cetirizine (ZYRTEC ALLERGY) 10 MG tablet Take 1 tablet (10 mg total) by mouth daily. 90 tablet 0   cholecalciferol (VITAMIN D) 1000 UNITS tablet Take 1,000 Units by mouth daily.     clonazePAM (KLONOPIN) 1 MG tablet Take 1 tablet qam & 2 tablets qhs 60 tablet 3   DEXILANT 30 MG capsule Take 1 capsule by mouth daily.     diclofenac Sodium (VOLTAREN) 1 % GEL Apply topically 4 (four) times daily. (Patient not taking: Reported on 06/04/2021)     diphenhydrAMINE (BENADRYL) 25 MG tablet 1 tablet as needed     Echinacea-Goldenseal LIQD Place 1 drop into the nose daily as needed.     Ginger, Zingiber officinalis, (GINGER EXTRACT PO) Take by mouth.     hydrocortisone (ANUSOL-HC) 2.5 % rectal cream PLACE 1 APPLICATION IN RECTUM 3 TIMES A DAY     lidocaine (LIDODERM) 5 % Place 1 patch onto the skin daily. Remove & Discard patch within 12 hours or as directed by MD 30 patch 0   Magnesium 500 MG TABS 1 tablet     mirtazapine (REMERON) 30 MG tablet Take 1 tablet (30 mg total) by mouth at bedtime. 30 tablet 5   NON FORMULARY 2 (two) times  daily. [place]     nortriptyline (PAMELOR) 50 MG capsule 2 qhs 60 capsule 4   Omega-3 Fatty Acids (FISH OIL PO) Take by mouth 2 (two) times a day.     ondansetron (ZOFRAN) 8 MG tablet Take 1 tablet (8 mg  total) by mouth every 8 (eight) hours as needed for nausea or vomiting. 20 tablet 3   oxyCODONE-acetaminophen (PERCOCET) 10-325 MG tablet Take 1 tablet by mouth 2 (two) times daily.     potassium chloride SA (K-DUR,KLOR-CON) 20 MEQ tablet Take 20 mEq by mouth 2 (two) times daily.     promethazine-dextromethorphan (PROMETHAZINE-DM) 6.25-15 MG/5ML syrup 5 ml as needed     rosuvastatin (CRESTOR) 10 MG tablet 1 tablet     topiramate (TOPAMAX) 100 MG tablet Take 1 tablet (100 mg total) by mouth 2 (two) times daily. 180 tablet 3   triamterene-hydrochlorothiazide (DYAZIDE) 50-25 MG capsule Take 1 capsule by mouth daily.     TURMERIC PO Take by mouth as needed.     Ubrogepant (UBRELVY) 50 MG TABS Take one tablet onset migraine, may repeat in 2 hours if needed (max 2 tabs/24 hours) 10 tablet 11   valACYclovir (VALTREX) 1000 MG tablet Take 1,000 mg by mouth daily.     vitamin B-12 (CYANOCOBALAMIN) 1000 MCG tablet Take 1,000 mcg by mouth daily.     No current facility-administered medications for this visit.    Lab Results: No results found for this or any previous visit (from the past 48 hour(s)).  Physical Findings: AIMS:  , ,  ,  ,    CIWA:    COWS:     Musculoskeletal: Strength & Muscle Tone: within normal limits Gait & Station: normal Patient leans: N/A  Psychiatric Specialty Exam: ROS  There were no vitals taken for this visit.There is no height or weight on file to calculate BMI.  General Appearance: Casual  Eye Contact::  Good  Speech:  Clear and Coherent  Volume:  Normal  Mood:  Euthymic  Affect:  Congruent  Thought Process:  Coherent  Orientation:  Full (Time, Place, and Person)  Thought Content:  WDL  Suicidal Thoughts:  No  Homicidal Thoughts:  No  Memory:  NA  Judgement:  Good  Insight:  Fair  Psychomotor Activity:  Normal  Concentration:  Fair  Recall:  Good  Fund of Knowledge:Good  Language: Good  Akathisia:  No  Handed:  Right  AIMS (if indicated):     Assets:    ADL's:  Intact  Cognition: WNL  Sleep:       Treatment Plan  07/29/2022, 1:41 PM    This patient's first problem is that of major clinical depression.  She continues taking nortriptyline 50 mg and Remeron 30 mg.  At this time she is in remission.  Her second problem is adjustment disorder with an anxious mood state.  She will continue taking 1 mg of Klonopin twice daily.  Her third problem is insomnia.  The patient at this time we will begin him on doxepin 25 mg nightly.  Once the patient is adjusted in her new setting we will talk to her about reducing her Klonopin.  Right now she seems to be very stable and is functioning very well.

## 2022-08-11 ENCOUNTER — Telehealth (HOSPITAL_COMMUNITY): Payer: Self-pay | Admitting: *Deleted

## 2022-08-11 NOTE — Telephone Encounter (Signed)
Pt of Dr. Donell Beers who called requesting refill of Restoril. Last script was sent on 10/28/21  for 15 mg. Pt is currently taking Doxepin 25 mg QHS, Pamelor 100 mg QHS, Remeron 30 mg QHS, and Klonopin 2 mg QHS and still c/o insomnia. Pt also says that she needs her Klonopin 1 mg QAM and 2 mg QHS (current order) to be increased back up to Klonopin 2 mg BID. Writer and Dr. Donell Beers have both advised pt that the Temazepam will not be ordered. Pt continues to complain of insomnia. Pt wants to speak with provider as she is insistent about the Temazepam being ordered.  Pt next visit not until 10/28/22. Pt last seen on 07/29/22. Please review.

## 2022-08-11 NOTE — Telephone Encounter (Signed)
Will advise

## 2022-08-12 ENCOUNTER — Other Ambulatory Visit: Payer: Self-pay | Admitting: Family Medicine

## 2022-08-12 DIAGNOSIS — Z0000 Encounter for general adult medical examination without abnormal findings: Secondary | ICD-10-CM

## 2022-08-17 ENCOUNTER — Ambulatory Visit
Admission: RE | Admit: 2022-08-17 | Discharge: 2022-08-17 | Disposition: A | Discharge status: Home / Self Care | Payer: 59 | Source: Ambulatory Visit | Attending: Family Medicine | Admitting: Family Medicine

## 2022-08-17 DIAGNOSIS — Z1231 Encounter for screening mammogram for malignant neoplasm of breast: Secondary | ICD-10-CM | POA: Diagnosis not present

## 2022-08-17 DIAGNOSIS — Z0000 Encounter for general adult medical examination without abnormal findings: Secondary | ICD-10-CM

## 2022-08-18 ENCOUNTER — Other Ambulatory Visit (HOSPITAL_COMMUNITY): Payer: Self-pay | Admitting: Psychiatry

## 2022-08-20 ENCOUNTER — Other Ambulatory Visit (HOSPITAL_COMMUNITY): Payer: Self-pay | Admitting: *Deleted

## 2022-08-20 MED ORDER — CLONAZEPAM 1 MG PO TABS: ORAL_TABLET | ORAL | 2 refills | Status: DC

## 2022-08-26 DIAGNOSIS — E538 Deficiency of other specified B group vitamins: Secondary | ICD-10-CM | POA: Diagnosis not present

## 2022-08-26 DIAGNOSIS — I10 Essential (primary) hypertension: Secondary | ICD-10-CM | POA: Diagnosis not present

## 2022-08-26 DIAGNOSIS — M25512 Pain in left shoulder: Secondary | ICD-10-CM | POA: Diagnosis not present

## 2022-08-26 DIAGNOSIS — E7800 Pure hypercholesterolemia, unspecified: Secondary | ICD-10-CM | POA: Diagnosis not present

## 2022-08-26 DIAGNOSIS — F17210 Nicotine dependence, cigarettes, uncomplicated: Secondary | ICD-10-CM | POA: Diagnosis not present

## 2022-08-26 DIAGNOSIS — R7303 Prediabetes: Secondary | ICD-10-CM | POA: Diagnosis not present

## 2022-08-26 DIAGNOSIS — M25511 Pain in right shoulder: Secondary | ICD-10-CM | POA: Diagnosis not present

## 2022-08-26 DIAGNOSIS — M545 Low back pain, unspecified: Secondary | ICD-10-CM | POA: Diagnosis not present

## 2022-08-26 DIAGNOSIS — G8929 Other chronic pain: Secondary | ICD-10-CM | POA: Diagnosis not present

## 2022-08-26 DIAGNOSIS — Z79899 Other long term (current) drug therapy: Secondary | ICD-10-CM | POA: Diagnosis not present

## 2022-09-15 DIAGNOSIS — G8929 Other chronic pain: Secondary | ICD-10-CM | POA: Diagnosis not present

## 2022-09-15 DIAGNOSIS — M25512 Pain in left shoulder: Secondary | ICD-10-CM | POA: Diagnosis not present

## 2022-09-16 DIAGNOSIS — M25512 Pain in left shoulder: Secondary | ICD-10-CM | POA: Diagnosis not present

## 2022-09-18 DIAGNOSIS — M25512 Pain in left shoulder: Secondary | ICD-10-CM | POA: Diagnosis not present

## 2022-09-25 DIAGNOSIS — M545 Low back pain, unspecified: Secondary | ICD-10-CM | POA: Diagnosis not present

## 2022-09-25 DIAGNOSIS — R7303 Prediabetes: Secondary | ICD-10-CM | POA: Diagnosis not present

## 2022-09-25 DIAGNOSIS — G8929 Other chronic pain: Secondary | ICD-10-CM | POA: Diagnosis not present

## 2022-09-25 DIAGNOSIS — N183 Chronic kidney disease, stage 3 unspecified: Secondary | ICD-10-CM | POA: Diagnosis not present

## 2022-09-25 DIAGNOSIS — E7800 Pure hypercholesterolemia, unspecified: Secondary | ICD-10-CM | POA: Diagnosis not present

## 2022-09-25 DIAGNOSIS — F17210 Nicotine dependence, cigarettes, uncomplicated: Secondary | ICD-10-CM | POA: Diagnosis not present

## 2022-09-25 DIAGNOSIS — I10 Essential (primary) hypertension: Secondary | ICD-10-CM | POA: Diagnosis not present

## 2022-09-25 DIAGNOSIS — R0602 Shortness of breath: Secondary | ICD-10-CM | POA: Diagnosis not present

## 2022-09-25 DIAGNOSIS — E538 Deficiency of other specified B group vitamins: Secondary | ICD-10-CM | POA: Diagnosis not present

## 2022-09-25 DIAGNOSIS — Z79899 Other long term (current) drug therapy: Secondary | ICD-10-CM | POA: Diagnosis not present

## 2022-09-28 DIAGNOSIS — M25512 Pain in left shoulder: Secondary | ICD-10-CM | POA: Diagnosis not present

## 2022-09-29 DIAGNOSIS — Z79899 Other long term (current) drug therapy: Secondary | ICD-10-CM | POA: Diagnosis not present

## 2022-10-13 DIAGNOSIS — I10 Essential (primary) hypertension: Secondary | ICD-10-CM | POA: Diagnosis not present

## 2022-10-13 DIAGNOSIS — E785 Hyperlipidemia, unspecified: Secondary | ICD-10-CM | POA: Diagnosis not present

## 2022-10-13 DIAGNOSIS — G720 Drug-induced myopathy: Secondary | ICD-10-CM | POA: Diagnosis not present

## 2022-10-13 DIAGNOSIS — R519 Headache, unspecified: Secondary | ICD-10-CM | POA: Diagnosis not present

## 2022-10-13 DIAGNOSIS — E119 Type 2 diabetes mellitus without complications: Secondary | ICD-10-CM | POA: Diagnosis not present

## 2022-10-13 DIAGNOSIS — G894 Chronic pain syndrome: Secondary | ICD-10-CM | POA: Diagnosis not present

## 2022-10-13 DIAGNOSIS — E1169 Type 2 diabetes mellitus with other specified complication: Secondary | ICD-10-CM | POA: Diagnosis not present

## 2022-10-19 DIAGNOSIS — S46012D Strain of muscle(s) and tendon(s) of the rotator cuff of left shoulder, subsequent encounter: Secondary | ICD-10-CM | POA: Diagnosis not present

## 2022-10-23 DIAGNOSIS — E7800 Pure hypercholesterolemia, unspecified: Secondary | ICD-10-CM | POA: Diagnosis not present

## 2022-10-23 DIAGNOSIS — G8929 Other chronic pain: Secondary | ICD-10-CM | POA: Diagnosis not present

## 2022-10-23 DIAGNOSIS — R7303 Prediabetes: Secondary | ICD-10-CM | POA: Diagnosis not present

## 2022-10-23 DIAGNOSIS — N183 Chronic kidney disease, stage 3 unspecified: Secondary | ICD-10-CM | POA: Diagnosis not present

## 2022-10-23 DIAGNOSIS — M545 Low back pain, unspecified: Secondary | ICD-10-CM | POA: Diagnosis not present

## 2022-10-23 DIAGNOSIS — R0602 Shortness of breath: Secondary | ICD-10-CM | POA: Diagnosis not present

## 2022-10-23 DIAGNOSIS — I10 Essential (primary) hypertension: Secondary | ICD-10-CM | POA: Diagnosis not present

## 2022-10-23 DIAGNOSIS — Z79899 Other long term (current) drug therapy: Secondary | ICD-10-CM | POA: Diagnosis not present

## 2022-10-23 DIAGNOSIS — F17210 Nicotine dependence, cigarettes, uncomplicated: Secondary | ICD-10-CM | POA: Diagnosis not present

## 2022-10-23 DIAGNOSIS — E538 Deficiency of other specified B group vitamins: Secondary | ICD-10-CM | POA: Diagnosis not present

## 2022-10-27 DIAGNOSIS — Z79899 Other long term (current) drug therapy: Secondary | ICD-10-CM | POA: Diagnosis not present

## 2022-10-28 ENCOUNTER — Ambulatory Visit (HOSPITAL_COMMUNITY): Payer: 59 | Admitting: Psychiatry

## 2022-11-09 DIAGNOSIS — S46012D Strain of muscle(s) and tendon(s) of the rotator cuff of left shoulder, subsequent encounter: Secondary | ICD-10-CM | POA: Diagnosis not present

## 2022-11-10 DIAGNOSIS — M12812 Other specific arthropathies, not elsewhere classified, left shoulder: Secondary | ICD-10-CM | POA: Diagnosis not present

## 2022-11-10 DIAGNOSIS — I10 Essential (primary) hypertension: Secondary | ICD-10-CM | POA: Diagnosis not present

## 2022-11-10 DIAGNOSIS — M75102 Unspecified rotator cuff tear or rupture of left shoulder, not specified as traumatic: Secondary | ICD-10-CM | POA: Diagnosis not present

## 2022-11-11 ENCOUNTER — Telehealth (HOSPITAL_COMMUNITY): Payer: Self-pay

## 2022-11-11 NOTE — Telephone Encounter (Signed)
Patient called today for a letter with her diagnosis and medications for her landlord. I advised that I would get the letter done and call her when it is ready for pick up

## 2022-11-16 NOTE — H&P (Signed)
PREOPERATIVE H&P  Chief Complaint: LEFT SHOULDER BURSITIS, BICIPITAL TENDONITIS, ROTATOR CUFF TENDINOSIS  HPI: Gloria Lewis is a 67 y.o. female who presents with a diagnosis of LEFT SHOULDER BURSITIS, BICIPITAL TENDONITIS, AND ROTATOR CUFF TENDINOSIS. Symptoms are rated as moderate to severe, and have been worsening.  This is significantly impairing activities of daily living.  She has elected for surgical management.   Past Medical History:  Diagnosis Date   Anginal pain (HCC)    admit 06/2014; had non-ischemic stress test   Anxiety    Bipolar 1 disorder (HCC)    Colon polyp    CTS (carpal tunnel syndrome)    Depression    Diabetes mellitus without complication (HCC)    Fever blister    GERD (gastroesophageal reflux disease)    HA (headache)    HTN (hypertension)    Hypercholesterolemia    Migraines    OA (osteoarthritis)    Schizo-affective psychosis (HCC)    Past Surgical History:  Procedure Laterality Date   ANTERIOR CERVICAL DECOMP/DISCECTOMY FUSION  08/27/2011   Procedure: ANTERIOR CERVICAL DECOMPRESSION/DISCECTOMY FUSION 1 LEVEL/HARDWARE REMOVAL;  Surgeon: Tia Alert, MD;  Location: [facility] NEURO ORS;  Service: Neurosurgery;  Laterality: Bilateral;  Cervical four-five Anterior cervical decompression/diskectomy, fusion, Plate, Removal of Cervical five-seven Plate   back injection     CARDIAC CATHETERIZATION N/A 11/09/2014   Procedure: Right Heart Cath;  Surgeon: Laurey Morale, MD;  Location: [facility] INVASIVE CV LAB;  Service: Cardiovascular;  Laterality: N/A;   CARPAL TUNNEL RELEASE  20110 rt/lt   rt x2 , lt x1   COLONOSCOPY  06/2017   [facility]   ESOPHAGOGASTRODUODENOSCOPY  06/2017   [facility]   HEMORRHOID SURGERY     MULTIPLE TOOTH EXTRACTIONS     NECK SURGERY  2009   PITUITARY SURGERY     Had gland removed from producing too much calcium   polp removed  2011   RIGHT/LEFT HEART CATH AND CORONARY ANGIOGRAPHY N/A 07/05/2017   Procedure:  RIGHT/LEFT HEART CATH AND CORONARY ANGIOGRAPHY;  Surgeon: Laurey Morale, MD;  Location: [facility] INVASIVE CV LAB;  Service: Cardiovascular;  Laterality: N/A;   SHOULDER ARTHROSCOPY WITH ROTATOR CUFF REPAIR Right 05/23/2015   Procedure: RIGHT SHOULDER ARTHROSCOPY WITH REMOVAL OF SUTURE ANCHOR AND POSSIBLE REVISION ROTATOR CUFF REPAIR;  Surgeon: Francena Hanly, MD;  Location: [facility] OR;  Service: Orthopedics;  Laterality: Right;   SHOULDER ARTHROSCOPY WITH SUBACROMIAL DECOMPRESSION Right 01/24/2015   Procedure: RIGHT SHOULDER ARTHROSCOPY WITH SUBACROMIAL DECOMPRESSION AD DISTAL CLAVICLE RESECTION ;  Surgeon: Francena Hanly, MD;  Location: [facility] OR;  Service: Orthopedics;  Laterality: Right;   VAGINAL DELIVERY     x3   Social History   Socioeconomic History   Marital status: Single    Spouse name: Not on file   Number of children: 3   Years of education: 12   Highest education level: Some college, no degree  Occupational History   Occupation: disabled/retired  Tobacco Use   Smoking status: Some Days    Current packs/day: 0.10    Average packs/day: 0.1 packs/day for 30.0 years (3.0 ttl pk-yrs)    Types: Cigarettes   Smokeless tobacco: Never  Vaping Use   Vaping status: Some Days  Substance and Sexual Activity   Alcohol use: No    Alcohol/week: 0.0 standard drinks of alcohol   Drug use: No    Comment: hx crack addiction 2008   Sexual activity: Never  Other Topics Concern   Not on  file  Social History Narrative   Lives in a two story home alone      [place] in [place] for pain management      Right handed   12th grade      Caffeine coffee 1 cup /day   Social Determinants of Health   Financial Resource Strain: Medium Risk (03/12/2017)   Overall Financial Resource Strain (CARDIA)    Difficulty of Paying Living Expenses: Somewhat hard  Food Insecurity: Food Insecurity Present (03/12/2017)   Hunger Vital Sign    Worried About Running Out of Food in the Last Year: Sometimes true    Ran Out  of Food in the Last Year: Sometimes true  Transportation Needs: Unmet Transportation Needs (03/12/2017)   PRAPARE - Administrator, Civil Service (Medical): Yes    Lack of Transportation (Non-Medical): Yes  Physical Activity: Inactive (03/12/2017)   Exercise Vital Sign    Days of Exercise per Week: 0 days    Minutes of Exercise per Session: 0 min  Stress: Stress Concern Present (03/12/2017)   [employer] of Occupational Health - Occupational Stress Questionnaire    Feeling of Stress : Rather much  Social Connections: Moderately Integrated (03/12/2017)   Social Connection and Isolation Panel [NHANES]    Frequency of Communication with Friends and Family: More than three times a week    Frequency of Social Gatherings with Friends and Family: More than three times a week    Attends Religious Services: More than 4 times per year    Active Member of [employer] or Organizations: Yes    Attends Engineer, structural: More than 4 times per year    Marital Status: Never married   Family History  Problem Relation Age of Onset   Coronary artery disease Father    Cancer Father        head neck    Esophageal cancer Father    Hypertension Mother    Schizophrenia Mother    Diabetes Mother    Heart attack Mother    Depression Brother    Suicidality Brother    Prostate cancer Brother    Cancer Brother        bone marrow   Schizophrenia Maternal Grandmother    Anesthesia problems Neg Hx    Hypotension Neg Hx    Malignant hyperthermia Neg Hx    Pseudochol deficiency Neg Hx    Allergic rhinitis Neg Hx    Angioedema Neg Hx    Asthma Neg Hx    Atopy Neg Hx    Eczema Neg Hx    Immunodeficiency Neg Hx    Urticaria Neg Hx    Breast cancer Neg Hx    Allergies  Allergen Reactions   Effexor [Venlafaxine Hydrochloride] Itching and Other (See Comments)    headache   Latex Itching and Rash    Other reaction(s): Unknown   Penicillins Hives    Has patient had a PCN reaction  causing immediate rash, facial/tongue/throat swelling, SOB or lightheadedness with hypotension: Yes Has patient had a PCN reaction causing severe rash involving mucus membranes or skin necrosis: No Has patient had a PCN reaction that required hospitalization No Has patient had a PCN reaction occurring within the last 10 years: No If all of the above answers are "NO", then may proceed with Cephalosporin use.    Zithromax [Azithromycin Dihydrate] Swelling   Amlodipine Swelling   Atorvastatin Swelling    Muscle aches   Butrans [Buprenorphine] Other (See Comments)  Ulcers-"mouth would not heal"   Codeine Itching    Tolerable with benadryl   Nucynta [Tapentadol] Other (See Comments)    Ulcers inside of mouth   Nurtec [Rimegepant Sulfate] Nausea Only   Other     Other reaction(s): nausea Other reaction(s): itching Other reaction(s): itch Other reaction(s): HA Other reaction(s): Unknown   Pravastatin Sodium     Other reaction(s): itching   Propranolol Hcl     Other reaction(s): dyspnea   Sumatriptan     Other reaction(s): Unknown   Venlafaxine     Other reaction(s): HA   Vicodin [Hydrocodone-Acetaminophen] Itching   Amoxicillin Itching    Other reaction(s): itching   Chantix [Varenicline Tartrate] Nausea Only   Hydrocodone Itching    Tolerable with benadryl Other reaction(s): itch   Paroxetine Hcl Other (See Comments)    headache   Tramadol Other (See Comments)    Pt states it interacted with her sertraline, but she is no longer on sertraline.  She does not remember the type of reaction she had.  Other reaction(s): Fainted   Prior to Admission medications   Medication Sig Start Date End Date Taking? Authorizing Provider  ASHWAGANDHA PO Take 1 capsule by mouth 2 (two) times daily.    [provider]  azelastine (ASTELIN) 0.1 % nasal spray Place 1 spray into both nostrils 2 (two) times daily. 01/24/20   [provider]  cetirizine (ZYRTEC ALLERGY) 10 MG  tablet Take 1 tablet (10 mg total) by mouth daily. 08/17/19   Wallis Bamberg, PA-C  cholecalciferol (VITAMIN D) 1000 UNITS tablet Take 1,000 Units by mouth daily.    [provider]  clonazePAM (KLONOPIN) 1 MG tablet Take 1 tablet qam & 2 tablets qhs 08/20/22   Plovsky, Earvin Hansen, MD  DEXILANT 30 MG capsule Take 1 capsule by mouth daily. 01/31/20   [provider]  diclofenac Sodium (VOLTAREN) 1 % GEL Apply topically 4 (four) times daily. Patient not taking: Reported on 06/04/2021    [provider]  diphenhydrAMINE (BENADRYL) 25 MG tablet 1 tablet as needed 08/12/15   [provider]  doxepin (SINEQUAN) 25 MG capsule Take 1 capsule (25 mg total) by mouth at bedtime. 07/29/22   Archer Asa, MD  Echinacea-Goldenseal LIQD Place 1 drop into the nose daily as needed.    [provider]  Ginger, Zingiber officinalis, (GINGER EXTRACT PO) Take by mouth.    [provider]  hydrocortisone (ANUSOL-HC) 2.5 % rectal cream PLACE 1 APPLICATION IN RECTUM 3 TIMES A DAY 08/26/17   [provider]  lidocaine (LIDODERM) 5 % Place 1 patch onto the skin daily. Remove & Discard patch within 12 hours or as directed by MD 05/23/20   Raulkar, Drema Pry, MD  Magnesium 500 MG TABS 1 tablet    [provider]  mirtazapine (REMERON) 30 MG tablet Take 1 tablet (30 mg total) by mouth at bedtime. 07/29/22 07/29/23  Plovsky, Earvin Hansen, MD  NON FORMULARY 2 (two) times daily. [place]    [provider]  nortriptyline (PAMELOR) 50 MG capsule 2 qhs 07/29/22   Plovsky, Earvin Hansen, MD  Omega-3 Fatty Acids (FISH OIL PO) Take by mouth 2 (two) times a day.    [provider]  ondansetron (ZOFRAN) 8 MG tablet Take 1 tablet (8 mg total) by mouth every 8 (eight) hours as needed for nausea or vomiting. 11/14/18   Drema Dallas, DO  oxyCODONE-acetaminophen (PERCOCET) 10-325 MG tablet Take 1 tablet by mouth 2 (two) times daily.  [provider]  potassium chloride  SA (K-DUR,KLOR-CON) 20 MEQ tablet Take 20 mEq by mouth 2 (two) times daily.    [provider]  promethazine-dextromethorphan (PROMETHAZINE-DM) 6.25-15 MG/5ML syrup 5 ml as needed 05/16/20   [provider]  rosuvastatin (CRESTOR) 10 MG tablet 1 tablet 08/25/19   [provider]  topiramate (TOPAMAX) 100 MG tablet Take 1 tablet (100 mg total) by mouth 2 (two) times daily. 06/04/21   Lomax, Amy, NP  triamterene-hydrochlorothiazide (DYAZIDE) 50-25 MG capsule Take 1 capsule by mouth daily.    [provider]  TURMERIC PO Take by mouth as needed.    [provider]  Ubrogepant (UBRELVY) 50 MG TABS Take one tablet onset migraine, may repeat in 2 hours if needed (max 2 tabs/24 hours) 06/04/21   Lomax, Amy, NP  valACYclovir (VALTREX) 1000 MG tablet Take 1,000 mg by mouth daily.    [provider]  vitamin B-12 (CYANOCOBALAMIN) 1000 MCG tablet Take 1,000 mcg by mouth daily.    [provider]     Positive ROS: All other systems have been reviewed and were otherwise negative with the exception of those mentioned in the HPI and as above.  Physical Exam: General: Alert, no acute distress Cardiovascular: No pedal edema Respiratory: No cyanosis, no use of accessory musculature GI: No organomegaly, abdomen is soft and non-tender Skin: No lesions in the area of chief complaint Neurologic: Sensation intact distally Psychiatric: Patient is competent for consent with normal mood and affect Lymphatic: No axillary or cervical lymphadenopathy  MUSCULOSKELETAL: TTP anterior shoulder and AC joint, limited ROM especially forward flexion, decreased strength, + O'Brien's test, + Hawkins test, NVI   Imaging: MRI left shoulder shows - moderate to severe cuff tendinosis with partial thickness tearing of the supraspinatus and infraspinatus - moderate glenohumeral OA with numerous tiny loose bodies - severe bursitis - mild to moderate AC joint OA - mild  biceps tendinosis   Assessment: LEFT SHOULDER BURSITIS, BICIPITAL TENDONITIS  Plan: Plan for Procedure(s): SHOULDER ARTHROSCOPY WITH SUBACROMIAL DECOMPRESSION AND DISTAL CLAVICLE EXCISION ARTHROSCOPY SHOULDER, BICEPS TENODESIS, POSSIBLE ROTATOR CUFF REPAIR  The risks benefits and alternatives were discussed with the patient including but not limited to the risks of nonoperative treatment, versus surgical intervention including infection, bleeding, nerve injury,  blood clots, cardiopulmonary complications, morbidity, mortality, among others, and they were willing to proceed.   Weightbearing: NWB LUE Orthopedic devices: sling Showering: POD 3 Dressing: reinforce PRN Medicines: ASA, Oxy, Tylenol, Mobic, Baclofen, Zofran  Discharge: home Follow up: 12/11/22 at 11:30am    [place] Board Office [phone] 11/16/2022 2:38 PM

## 2022-11-19 ENCOUNTER — Encounter (HOSPITAL_COMMUNITY): Payer: Self-pay

## 2022-11-23 ENCOUNTER — Other Ambulatory Visit (HOSPITAL_COMMUNITY): Payer: Self-pay | Admitting: Psychiatry

## 2022-11-23 DIAGNOSIS — E7800 Pure hypercholesterolemia, unspecified: Secondary | ICD-10-CM | POA: Diagnosis not present

## 2022-11-23 DIAGNOSIS — G8929 Other chronic pain: Secondary | ICD-10-CM | POA: Diagnosis not present

## 2022-11-23 DIAGNOSIS — N183 Chronic kidney disease, stage 3 unspecified: Secondary | ICD-10-CM | POA: Diagnosis not present

## 2022-11-23 DIAGNOSIS — F17210 Nicotine dependence, cigarettes, uncomplicated: Secondary | ICD-10-CM | POA: Diagnosis not present

## 2022-11-23 DIAGNOSIS — Z79899 Other long term (current) drug therapy: Secondary | ICD-10-CM | POA: Diagnosis not present

## 2022-11-23 DIAGNOSIS — I10 Essential (primary) hypertension: Secondary | ICD-10-CM | POA: Diagnosis not present

## 2022-11-23 DIAGNOSIS — R7303 Prediabetes: Secondary | ICD-10-CM | POA: Diagnosis not present

## 2022-11-23 DIAGNOSIS — R0602 Shortness of breath: Secondary | ICD-10-CM | POA: Diagnosis not present

## 2022-11-23 DIAGNOSIS — Z1231 Encounter for screening mammogram for malignant neoplasm of breast: Secondary | ICD-10-CM | POA: Diagnosis not present

## 2022-11-23 DIAGNOSIS — M545 Low back pain, unspecified: Secondary | ICD-10-CM | POA: Diagnosis not present

## 2022-11-24 ENCOUNTER — Other Ambulatory Visit: Payer: Self-pay

## 2022-11-24 ENCOUNTER — Encounter (HOSPITAL_BASED_OUTPATIENT_CLINIC_OR_DEPARTMENT_OTHER): Payer: Self-pay | Admitting: Orthopedic Surgery

## 2022-11-24 NOTE — Progress Notes (Signed)
   11/24/22 1343  PAT Phone Screen  Is the patient taking a GLP-1 receptor agonist? No  Do You Have Diabetes? No  Do You Have Hypertension? Yes  Have You Ever Been to the ER for Asthma? No  Have You Taken Oral Steroids in the Past 3 Months? No  Do you Take Phenteramine or any Other Diet Drugs? No  Do you have a history of heart problems? (S)  Yes (pt had cardiac cath in 2016, no cardiac complications since. Pt has not needed to follow up with cardiology)  Have you ever had tests on your heart? No  Any Recent Hospitalizations? No  Height 5\' 7"  (1.702 m)  Weight 104.8 kg  Pat Appointment Scheduled (S)  Yes (EKG, BMP)

## 2022-11-27 ENCOUNTER — Other Ambulatory Visit (HOSPITAL_COMMUNITY): Payer: Self-pay | Admitting: *Deleted

## 2022-11-27 MED ORDER — CLONAZEPAM 1 MG PO TABS: ORAL_TABLET | ORAL | 0 refills | Status: DC

## 2022-11-30 ENCOUNTER — Encounter (HOSPITAL_BASED_OUTPATIENT_CLINIC_OR_DEPARTMENT_OTHER)
Admission: RE | Admit: 2022-11-30 | Discharge: 2022-11-30 | Disposition: A | Discharge status: Home / Self Care | Payer: 59 | Source: Ambulatory Visit | Attending: Orthopedic Surgery | Admitting: Orthopedic Surgery

## 2022-11-30 DIAGNOSIS — M24012 Loose body in left shoulder: Secondary | ICD-10-CM | POA: Diagnosis not present

## 2022-11-30 DIAGNOSIS — K219 Gastro-esophageal reflux disease without esophagitis: Secondary | ICD-10-CM | POA: Diagnosis not present

## 2022-11-30 DIAGNOSIS — F319 Bipolar disorder, unspecified: Secondary | ICD-10-CM | POA: Diagnosis not present

## 2022-11-30 DIAGNOSIS — F17210 Nicotine dependence, cigarettes, uncomplicated: Secondary | ICD-10-CM | POA: Diagnosis not present

## 2022-11-30 DIAGNOSIS — M67814 Other specified disorders of tendon, left shoulder: Secondary | ICD-10-CM | POA: Diagnosis not present

## 2022-11-30 DIAGNOSIS — M7522 Bicipital tendinitis, left shoulder: Secondary | ICD-10-CM | POA: Diagnosis not present

## 2022-11-30 DIAGNOSIS — M75122 Complete rotator cuff tear or rupture of left shoulder, not specified as traumatic: Secondary | ICD-10-CM | POA: Diagnosis not present

## 2022-11-30 DIAGNOSIS — I10 Essential (primary) hypertension: Secondary | ICD-10-CM | POA: Diagnosis not present

## 2022-11-30 DIAGNOSIS — X58XXXA Exposure to other specified factors, initial encounter: Secondary | ICD-10-CM | POA: Diagnosis not present

## 2022-11-30 DIAGNOSIS — Z79899 Other long term (current) drug therapy: Secondary | ICD-10-CM | POA: Diagnosis not present

## 2022-11-30 DIAGNOSIS — S43432A Superior glenoid labrum lesion of left shoulder, initial encounter: Secondary | ICD-10-CM | POA: Diagnosis not present

## 2022-11-30 DIAGNOSIS — I071 Rheumatic tricuspid insufficiency: Secondary | ICD-10-CM | POA: Diagnosis not present

## 2022-11-30 DIAGNOSIS — M7552 Bursitis of left shoulder: Secondary | ICD-10-CM | POA: Diagnosis not present

## 2022-11-30 DIAGNOSIS — Z01812 Encounter for preprocedural laboratory examination: Secondary | ICD-10-CM | POA: Insufficient documentation

## 2022-11-30 DIAGNOSIS — M19012 Primary osteoarthritis, left shoulder: Secondary | ICD-10-CM | POA: Diagnosis not present

## 2022-11-30 DIAGNOSIS — J449 Chronic obstructive pulmonary disease, unspecified: Secondary | ICD-10-CM | POA: Diagnosis not present

## 2022-11-30 DIAGNOSIS — M25812 Other specified joint disorders, left shoulder: Secondary | ICD-10-CM | POA: Diagnosis not present

## 2022-11-30 DIAGNOSIS — E119 Type 2 diabetes mellitus without complications: Secondary | ICD-10-CM | POA: Diagnosis not present

## 2022-11-30 LAB — BASIC METABOLIC PANEL
Anion gap: 9 (ref 5–15)
BUN: 13 mg/dL (ref 8–23)
CO2: 26 mmol/L (ref 22–32)
Calcium: 9.5 mg/dL (ref 8.9–10.3)
Chloride: 101 mmol/L (ref 98–111)
Creatinine, Ser: 1.08 mg/dL — ABNORMAL HIGH (ref 0.44–1.00)
GFR, Estimated: 57 mL/min — ABNORMAL LOW (ref >60)
Glucose, Bld: 93 mg/dL (ref 70–99)
Potassium: 3.6 mmol/L (ref 3.5–5.1)
Sodium: 136 mmol/L (ref 135–145)

## 2022-11-30 NOTE — Progress Notes (Signed)

## 2022-12-01 ENCOUNTER — Ambulatory Visit (HOSPITAL_BASED_OUTPATIENT_CLINIC_OR_DEPARTMENT_OTHER)
Admission: RE | Admit: 2022-12-01 | Discharge: 2022-12-01 | Disposition: A | Discharge status: Home / Self Care | Payer: 59 | Attending: Orthopedic Surgery | Admitting: Orthopedic Surgery

## 2022-12-01 ENCOUNTER — Encounter (HOSPITAL_BASED_OUTPATIENT_CLINIC_OR_DEPARTMENT_OTHER)
Admission: RE | Disposition: A | Discharge status: Home / Self Care | Payer: Self-pay | Source: Home / Self Care | Attending: Orthopedic Surgery

## 2022-12-01 ENCOUNTER — Ambulatory Visit (HOSPITAL_BASED_OUTPATIENT_CLINIC_OR_DEPARTMENT_OTHER): Payer: 59 | Admitting: Certified Registered"

## 2022-12-01 ENCOUNTER — Encounter (HOSPITAL_BASED_OUTPATIENT_CLINIC_OR_DEPARTMENT_OTHER): Payer: Self-pay | Admitting: Orthopedic Surgery

## 2022-12-01 DIAGNOSIS — F17210 Nicotine dependence, cigarettes, uncomplicated: Secondary | ICD-10-CM

## 2022-12-01 DIAGNOSIS — M7552 Bursitis of left shoulder: Secondary | ICD-10-CM | POA: Diagnosis not present

## 2022-12-01 DIAGNOSIS — M75122 Complete rotator cuff tear or rupture of left shoulder, not specified as traumatic: Secondary | ICD-10-CM | POA: Diagnosis not present

## 2022-12-01 DIAGNOSIS — Z79899 Other long term (current) drug therapy: Secondary | ICD-10-CM | POA: Insufficient documentation

## 2022-12-01 DIAGNOSIS — G8918 Other acute postprocedural pain: Secondary | ICD-10-CM | POA: Diagnosis not present

## 2022-12-01 DIAGNOSIS — M7522 Bicipital tendinitis, left shoulder: Secondary | ICD-10-CM | POA: Insufficient documentation

## 2022-12-01 DIAGNOSIS — M19012 Primary osteoarthritis, left shoulder: Secondary | ICD-10-CM | POA: Insufficient documentation

## 2022-12-01 DIAGNOSIS — F17200 Nicotine dependence, unspecified, uncomplicated: Secondary | ICD-10-CM | POA: Diagnosis not present

## 2022-12-01 DIAGNOSIS — E119 Type 2 diabetes mellitus without complications: Secondary | ICD-10-CM | POA: Diagnosis not present

## 2022-12-01 DIAGNOSIS — M67814 Other specified disorders of tendon, left shoulder: Secondary | ICD-10-CM | POA: Insufficient documentation

## 2022-12-01 DIAGNOSIS — S46012A Strain of muscle(s) and tendon(s) of the rotator cuff of left shoulder, initial encounter: Secondary | ICD-10-CM | POA: Diagnosis not present

## 2022-12-01 DIAGNOSIS — I10 Essential (primary) hypertension: Secondary | ICD-10-CM

## 2022-12-01 DIAGNOSIS — M75112 Incomplete rotator cuff tear or rupture of left shoulder, not specified as traumatic: Secondary | ICD-10-CM | POA: Diagnosis present

## 2022-12-01 DIAGNOSIS — K219 Gastro-esophageal reflux disease without esophagitis: Secondary | ICD-10-CM | POA: Insufficient documentation

## 2022-12-01 DIAGNOSIS — M25812 Other specified joint disorders, left shoulder: Secondary | ICD-10-CM | POA: Diagnosis not present

## 2022-12-01 DIAGNOSIS — J449 Chronic obstructive pulmonary disease, unspecified: Secondary | ICD-10-CM

## 2022-12-01 DIAGNOSIS — X58XXXA Exposure to other specified factors, initial encounter: Secondary | ICD-10-CM | POA: Insufficient documentation

## 2022-12-01 DIAGNOSIS — M24112 Other articular cartilage disorders, left shoulder: Secondary | ICD-10-CM | POA: Diagnosis not present

## 2022-12-01 DIAGNOSIS — M24012 Loose body in left shoulder: Secondary | ICD-10-CM | POA: Insufficient documentation

## 2022-12-01 DIAGNOSIS — S43432A Superior glenoid labrum lesion of left shoulder, initial encounter: Secondary | ICD-10-CM | POA: Insufficient documentation

## 2022-12-01 DIAGNOSIS — I071 Rheumatic tricuspid insufficiency: Secondary | ICD-10-CM | POA: Insufficient documentation

## 2022-12-01 DIAGNOSIS — F319 Bipolar disorder, unspecified: Secondary | ICD-10-CM | POA: Insufficient documentation

## 2022-12-01 HISTORY — PX: SHOULDER ARTHROSCOPY: SHX128

## 2022-12-01 HISTORY — DX: Other complications of anesthesia, initial encounter: T88.59XA

## 2022-12-01 SURGERY — SHOULDER ARTHROSCOPY WITH SUBACROMIAL DECOMPRESSION AND DISTAL CLAVICLE EXCISION
Anesthesia: General | Site: Shoulder | Laterality: Left

## 2022-12-01 MED ORDER — PROMETHAZINE HCL 25 MG/ML IJ SOLN: 6.2500 mg | INTRAMUSCULAR | Status: DC | PRN

## 2022-12-01 MED ORDER — ONDANSETRON HCL 4 MG/2ML IJ SOLN
INTRAMUSCULAR | Status: AC
  Filled 2022-12-01: qty 2

## 2022-12-01 MED ORDER — LIDOCAINE 2% (20 MG/ML) 5 ML SYRINGE
INTRAMUSCULAR | Status: AC
  Filled 2022-12-01: qty 5

## 2022-12-01 MED ORDER — DEXAMETHASONE SODIUM PHOSPHATE 10 MG/ML IJ SOLN: 8.0000 mg | Freq: Once | INTRAMUSCULAR | Status: DC

## 2022-12-01 MED ORDER — POVIDONE-IODINE 10 % EX SWAB: 2.0000 | Freq: Once | CUTANEOUS | Status: DC

## 2022-12-01 MED ORDER — PHENYLEPHRINE 80 MCG/ML (10ML) SYRINGE FOR IV PUSH (FOR BLOOD PRESSURE SUPPORT)
PREFILLED_SYRINGE | INTRAVENOUS | Status: AC
  Filled 2022-12-01: qty 10

## 2022-12-01 MED ORDER — DEXAMETHASONE SODIUM PHOSPHATE 10 MG/ML IJ SOLN
INTRAMUSCULAR | Status: AC
  Filled 2022-12-01: qty 1

## 2022-12-01 MED ORDER — MIDAZOLAM HCL 2 MG/2ML IJ SOLN
2.0000 mg | Freq: Once | INTRAMUSCULAR | Status: AC
  Administered 2022-12-01: 2 mg via INTRAVENOUS

## 2022-12-01 MED ORDER — MIDAZOLAM HCL 2 MG/2ML IJ SOLN
INTRAMUSCULAR | Status: AC
  Filled 2022-12-01: qty 2

## 2022-12-01 MED ORDER — LIDOCAINE 2% (20 MG/ML) 5 ML SYRINGE
INTRAMUSCULAR | Status: DC | PRN
  Administered 2022-12-01: 40 mg via INTRAVENOUS

## 2022-12-01 MED ORDER — OXYCODONE HCL 5 MG/5ML PO SOLN: 5.0000 mg | Freq: Once | ORAL | Status: DC | PRN

## 2022-12-01 MED ORDER — FENTANYL CITRATE (PF) 100 MCG/2ML IJ SOLN
INTRAMUSCULAR | Status: AC
  Filled 2022-12-01: qty 2

## 2022-12-01 MED ORDER — DEXAMETHASONE SODIUM PHOSPHATE 10 MG/ML IJ SOLN
INTRAMUSCULAR | Status: DC | PRN
  Administered 2022-12-01: 8 mg via INTRAVENOUS

## 2022-12-01 MED ORDER — BUPIVACAINE HCL (PF) 0.5 % IJ SOLN
INTRAMUSCULAR | Status: DC | PRN
  Administered 2022-12-01: 20 mL via PERINEURAL

## 2022-12-01 MED ORDER — LABETALOL HCL 5 MG/ML IV SOLN
INTRAVENOUS | Status: AC
  Filled 2022-12-01: qty 4

## 2022-12-01 MED ORDER — CEFAZOLIN SODIUM-DEXTROSE 2-4 GM/100ML-% IV SOLN
INTRAVENOUS | Status: AC
  Filled 2022-12-01: qty 100

## 2022-12-01 MED ORDER — PHENYLEPHRINE HCL-NACL 20-0.9 MG/250ML-% IV SOLN
INTRAVENOUS | Status: DC | PRN
  Administered 2022-12-01: 25 ug/min via INTRAVENOUS

## 2022-12-01 MED ORDER — PHENYLEPHRINE HCL (PRESSORS) 10 MG/ML IV SOLN
INTRAVENOUS | Status: DC | PRN
  Administered 2022-12-01: 80 ug via INTRAVENOUS
  Administered 2022-12-01 (×4): 160 ug via INTRAVENOUS
  Administered 2022-12-01: 80 ug via INTRAVENOUS

## 2022-12-01 MED ORDER — PHENYLEPHRINE HCL (PRESSORS) 10 MG/ML IV SOLN
INTRAVENOUS | Status: AC
  Filled 2022-12-01: qty 1

## 2022-12-01 MED ORDER — ROCURONIUM BROMIDE 100 MG/10ML IV SOLN
INTRAVENOUS | Status: DC | PRN
  Administered 2022-12-01: 50 mg via INTRAVENOUS

## 2022-12-01 MED ORDER — HYDROMORPHONE HCL 1 MG/ML IJ SOLN: 0.2500 mg | INTRAMUSCULAR | Status: DC | PRN

## 2022-12-01 MED ORDER — ACETAMINOPHEN 500 MG PO TABS: 1000.0000 mg | ORAL_TABLET | Freq: Three times a day (TID) | ORAL | 0 refills | Status: AC | PRN

## 2022-12-01 MED ORDER — MELOXICAM 15 MG PO TABS: 15.0000 mg | ORAL_TABLET | Freq: Every day | ORAL | 0 refills | Status: DC | PRN

## 2022-12-01 MED ORDER — CEFAZOLIN SODIUM-DEXTROSE 2-4 GM/100ML-% IV SOLN
2.0000 g | INTRAVENOUS | Status: AC
  Administered 2022-12-01: 2 g via INTRAVENOUS

## 2022-12-01 MED ORDER — ACETAMINOPHEN 500 MG PO TABS
ORAL_TABLET | ORAL | Status: AC
  Filled 2022-12-01: qty 2

## 2022-12-01 MED ORDER — ROCURONIUM BROMIDE 10 MG/ML (PF) SYRINGE
PREFILLED_SYRINGE | INTRAVENOUS | Status: AC
  Filled 2022-12-01: qty 10

## 2022-12-01 MED ORDER — ACETAMINOPHEN 500 MG PO TABS
1000.0000 mg | ORAL_TABLET | Freq: Once | ORAL | Status: AC
  Administered 2022-12-01: 1000 mg via ORAL

## 2022-12-01 MED ORDER — PROPOFOL 10 MG/ML IV BOLUS
INTRAVENOUS | Status: DC | PRN
  Administered 2022-12-01: 150 mg via INTRAVENOUS

## 2022-12-01 MED ORDER — BACLOFEN 10 MG PO TABS: 10.0000 mg | ORAL_TABLET | Freq: Three times a day (TID) | ORAL | 0 refills | Status: DC | PRN

## 2022-12-01 MED ORDER — ONDANSETRON HCL 4 MG/2ML IJ SOLN
INTRAMUSCULAR | Status: DC | PRN
  Administered 2022-12-01: 4 mg via INTRAVENOUS

## 2022-12-01 MED ORDER — EPHEDRINE 5 MG/ML INJ
INTRAVENOUS | Status: AC
  Filled 2022-12-01: qty 5

## 2022-12-01 MED ORDER — SODIUM CHLORIDE 0.9 % IR SOLN
Status: DC | PRN
  Administered 2022-12-01: 15000 mL

## 2022-12-01 MED ORDER — BUPIVACAINE LIPOSOME 1.3 % IJ SUSP
INTRAMUSCULAR | Status: DC | PRN
  Administered 2022-12-01: 10 mL via PERINEURAL

## 2022-12-01 MED ORDER — FENTANYL CITRATE (PF) 100 MCG/2ML IJ SOLN
INTRAMUSCULAR | Status: DC | PRN
  Administered 2022-12-01: 50 ug via INTRAVENOUS

## 2022-12-01 MED ORDER — LABETALOL HCL 5 MG/ML IV SOLN
5.0000 mg | Freq: Once | INTRAVENOUS | Status: AC
  Administered 2022-12-01: 5 mg via INTRAVENOUS

## 2022-12-01 MED ORDER — LACTATED RINGERS IV SOLN: INTRAVENOUS | Status: DC

## 2022-12-01 MED ORDER — OXYCODONE HCL 5 MG PO TABS: 5.0000 mg | ORAL_TABLET | Freq: Once | ORAL | Status: DC | PRN

## 2022-12-01 MED ORDER — FENTANYL CITRATE (PF) 100 MCG/2ML IJ SOLN
100.0000 ug | Freq: Once | INTRAMUSCULAR | Status: AC
  Administered 2022-12-01: 100 ug via INTRAVENOUS

## 2022-12-01 MED ORDER — SUGAMMADEX SODIUM 200 MG/2ML IV SOLN
INTRAVENOUS | Status: DC | PRN
  Administered 2022-12-01: 400 mg via INTRAVENOUS

## 2022-12-01 MED ORDER — EPHEDRINE SULFATE (PRESSORS) 50 MG/ML IJ SOLN
INTRAMUSCULAR | Status: DC | PRN
  Administered 2022-12-01 (×2): 5 mg via INTRAVENOUS
  Administered 2022-12-01: 15 mg via INTRAVENOUS

## 2022-12-01 MED ORDER — PROPOFOL 10 MG/ML IV BOLUS
INTRAVENOUS | Status: AC
  Filled 2022-12-01: qty 20

## 2022-12-01 MED ORDER — ASPIRIN 81 MG PO TBEC: 81.0000 mg | DELAYED_RELEASE_TABLET | Freq: Two times a day (BID) | ORAL | 0 refills | Status: DC

## 2022-12-01 SURGICAL SUPPLY — 57 items
AID PSTN UNV HD RSTRNT DISP (MISCELLANEOUS) ×1
ANCH SUT 2.6 FBRTK 1.7 (Anchor) ×1 IMPLANT
ANCH SUT 2.6 FBRTK 1.7 KNTLS (Anchor) ×1 IMPLANT
ANCH SUT 2.9 PUSHLOCK ANCH (Orthopedic Implant) ×1 IMPLANT
ANCH SUT SWLK 19.1X4.75 (Anchor) ×2 IMPLANT
ANCHOR FIBERTAK RC 2.6 (BLUE) (Anchor) IMPLANT
ANCHOR SUT BIO SW 4.75X19.1 (Anchor) IMPLANT
APL PRP STRL LF DISP 70% ISPRP (MISCELLANEOUS) ×1
BLADE EXCALIBUR 4.0X13 (MISCELLANEOUS) IMPLANT
BURR OVAL 8 FLU 5.0X13 (MISCELLANEOUS) ×1 IMPLANT
CANNULA 5.75X71 LONG (CANNULA) IMPLANT
CANNULA TWIST IN 8.25X7CM (CANNULA) IMPLANT
CHLORAPREP W/TINT 26 (MISCELLANEOUS) ×1 IMPLANT
DISSECTOR 3.8MM X 13CM (MISCELLANEOUS) ×1 IMPLANT
DRAPE IMP U-DRAPE 54X76 (DRAPES) ×1 IMPLANT
DRAPE INCISE IOBAN 66X45 STRL (DRAPES) ×1 IMPLANT
DRAPE POUCH INSTRU U-SHP 10X18 (DRAPES) ×1 IMPLANT
DRAPE SHOULDER BEACH CHAIR (DRAPES) ×1 IMPLANT
DRAPE U-SHAPE 47X51 STRL (DRAPES) ×1 IMPLANT
DRSG EMULSION OIL 3X3 NADH (GAUZE/BANDAGES/DRESSINGS) ×1 IMPLANT
GAUZE PAD ABD 8X10 STRL (GAUZE/BANDAGES/DRESSINGS) ×1 IMPLANT
GAUZE SPONGE 4X4 12PLY STRL (GAUZE/BANDAGES/DRESSINGS) ×2 IMPLANT
GLOVE BIO SURGEON STRL SZ7.5 (GLOVE) ×1 IMPLANT
GLOVE BIOGEL PI IND STRL 7.5 (GLOVE) ×2 IMPLANT
GLOVE BIOGEL PI IND STRL 8 (GLOVE) ×1 IMPLANT
GLOVE SURG SYN 7.5 E (GLOVE)
GLOVE SURG SYN 7.5 PF PI (GLOVE) ×1 IMPLANT
GOWN STRL REUS W/ TWL LRG LVL3 (GOWN DISPOSABLE) ×1 IMPLANT
GOWN STRL REUS W/ TWL XL LVL3 (GOWN DISPOSABLE) ×1 IMPLANT
GOWN STRL REUS W/TWL LRG LVL3 (GOWN DISPOSABLE) ×1
GOWN STRL REUS W/TWL XL LVL3 (GOWN DISPOSABLE) ×2 IMPLANT
IMPL FIBERTAK KNTLS 2.6 (Anchor) IMPLANT
IMPLANT FIBERTAK KNTLS 2.6 (Anchor) ×1 IMPLANT
KIT PUSHLOCK 2.9 HIP (KITS) IMPLANT
MANIFOLD NEPTUNE II (INSTRUMENTS) ×1 IMPLANT
NDL HD SCORPION MEGA LOADER (NEEDLE) IMPLANT
NS IRRIG 1000ML POUR BTL (IV SOLUTION) IMPLANT
PACK ARTHROSCOPY DSU (CUSTOM PROCEDURE TRAY) ×1 IMPLANT
PACK BASIN DAY SURGERY FS (CUSTOM PROCEDURE TRAY) ×1 IMPLANT
PORT APPOLLO RF 90DEGREE MULTI (SURGICAL WAND) ×1 IMPLANT
RESTRAINT HEAD UNIVERSAL NS (MISCELLANEOUS) ×1 IMPLANT
SLEEVE SCD COMPRESS KNEE MED (STOCKING) ×1 IMPLANT
SLING ARM FOAM STRAP LRG (SOFTGOODS) IMPLANT
SLING ARM FOAM STRAP XLG (SOFTGOODS) IMPLANT
SUT ETHILON 3 0 PS 1 (SUTURE) ×1 IMPLANT
SUT FIBERWIRE #2 38 T-5 BLUE (SUTURE)
SUT MNCRL AB 4-0 PS2 18 (SUTURE) IMPLANT
SUT TIGER TAPE 7 IN WHITE (SUTURE) IMPLANT
SUTURE FIBERWR #2 38 T-5 BLUE (SUTURE) IMPLANT
SUTURE TAPE TIGERLINK 1.3MM BL (SUTURE) IMPLANT
SUTURETAPE TIGERLINK 1.3MM BL (SUTURE)
SYSTEM IMPL TENODESIS LNT 2.9 (Orthopedic Implant) IMPLANT
TAPE FIBER 2MM 7IN #2 BLUE (SUTURE) IMPLANT
TOWEL GREEN STERILE FF (TOWEL DISPOSABLE) ×1 IMPLANT
TUBE CONNECTING 20X1/4 (TUBING) ×2 IMPLANT
TUBING ARTHROSCOPY IRRIG 16FT (MISCELLANEOUS) ×1 IMPLANT
WATER STERILE IRR 1000ML POUR (IV SOLUTION) ×1 IMPLANT

## 2022-12-01 NOTE — Op Note (Signed)
12/01/2022  11:33 AM  PATIENT:  Gloria Lewis    PRE-OPERATIVE DIAGNOSIS:  LEFT SHOULDER BURSITIS, BICIPITAL TENDONITIS  POST-OPERATIVE DIAGNOSIS:  Same  PROCEDURE:  SHOULDER ARTHROSCOPY WITH SUBACROMIAL DECOMPRESSION AND DISTAL CLAVICLE EXCISION, LOOSE BODY REMOVAL, ARTHROSCOPY SHOULDER, BICEPS TENODESIS, ROTATOR CUFF REPAIR  SURGEON:  Sheral Apley, MD  ASSISTANT: Levester Fresh, PA-C, he was present and scrubbed throughout the case, critical for completion in a timely fashion, and for retraction, instrumentation, and closure.   ANESTHESIA:   General  PREOPERATIVE INDICATIONS:  Gloria Lewis is a  67 y.o. female with a diagnosis of LEFT SHOULDER BURSITIS, BICIPITAL TENDONITIS who failed conservative measures and elected for surgical management.    The risks benefits and alternatives were discussed with the patient preoperatively including but not limited to the risks of infection, bleeding, nerve injury, cardiopulmonary complications, the need for revision surgery, among others, and the patient was willing to proceed.  DIAGNOSES: Left shoulder, acute on chronic rotator cuff tear, SLAP tear, biceps tendinitis, AC arthritis, subacromial impingement, and loose body in shoulder .  POST-OPERATIVE DIAGNOSIS: same  PROCEDURE: Arthroscopic extensive debridement - 29823 Subdeltoid Bursa, Superior Labrum, and glenoid chondral surface, synovium Arthroscopic distal clavicle excision - 45409 Arthroscopic subacromial decompression - 81191 Arthroscopic rotator cuff repair - 47829 Arthroscopic biceps tenodesis - 56213 Loose body excision   OPERATIVE FINDING: Exam under anesthesia: Normal Articular space: Normal Chondral surfaces:  grade 1 Biceps:  slap Subscapularis: Intact  Supraspinatus: Complete tear  Infraspinatus: Intact   8mm loose body   BLOOD LOSS: minimal  COMPLICATIONS: none  OPERATIVE PROCEDURE:  Patient was identified in the preoperative holding area and site  was marked by me He was transported to the operating theater and placed on the table in beach chair position taking care to pad all bony prominences. After a preincinduction time out anesthesia was induced. The left upper extremity was prepped and draped in normal sterile fashion and a pre-incision timeout was performed. Gloria Lewis received ancef for preoperative antibiotics.    Initially made a posterior arthroscopic portal and inserted the arthroscope into the glenohumeral joint. tour of the joint demonstrated the above operative findings  I created an anterior portal just lateral to the coracoid under direct visualization using a spinal needle.  I performed an extensive debridement of the scarred synovial tissue and remaining structures glenoid surface and subdeltoid bursa  I performed a Loop and tack biceps tenodesis with a pushlock anchor  I expanded the ant portal and removed the loose body with a grabber.   I then introduced the arthroscope into the subacromial space and brought the shaver into the anterior portal. I debrided the bursa for appropriate visualization.  I then performed a subacromial decompression using combination of the shaver ArthroCare and burr using a cutting block technique. As happy with the final elevation of the subacromial space on multiple portal views.   Next I turned my attention to the distal clavicle and through the anterior portal using the bur and shaver I was able to perform a distal clavicle excision. I then switched portals and inserted the arthroscope into the anterior portal and was happy with an appropriate resection of the distal clavicle.  I debrided the rotator cuff and repaired this with a 2x2 dual row repair.   I was happy with the tendon apposition and there was minimal to no dog ear.   Next I removed all arthroscopic equipment expressed all fluid and closed the portals with a nylon stitch.  A sterile dressing was applied the patient was  taken the PACU in stable condition.  POST OPERATIVE PLAN: The patient will be in a sling full-time and keep the dressings clean dry and intact. DVT prophylaxis will consist of early ambulation

## 2022-12-01 NOTE — Anesthesia Preprocedure Evaluation (Addendum)
Anesthesia Evaluation  Patient identified by MRN, date of birth, ID band Patient awake    Reviewed: Allergy & Precautions, H&P , NPO status , Patient's Chart, lab work & pertinent test results, reviewed documented beta blocker date and time   Airway Mallampati: II  TM Distance: >3 FB Neck ROM: Full    Dental  (+) Edentulous Upper, Edentulous Lower, Dental Advisory Given   Pulmonary COPD, Current Smoker, former smoker   Pulmonary exam normal breath sounds clear to auscultation       Cardiovascular hypertension, Pt. on medications Normal cardiovascular exam Rhythm:regular Rate:Normal  ECHO 08/2014 Study Conclusions  - Left ventricle: The cavity size was normal. Systolic function was normal. The estimated ejection fraction was in the range of 55% to 60%. There is hypokinesis of the mid-apicalinferior myocardium. Doppler parameters are consistent with abnormal left ventricular relaxation (grade 1 diastolic dysfunction). - Right ventricle: The cavity size was mildly dilated. Wall thickness was normal. - Right atrium: The atrium was mildly dilated. - Atrial septum: No defect or patent foramen ovale was identified with bubble study. - Tricuspid valve: There was moderate regurgitation. - Pulmonary arteries: Systolic pressure was mildly increased. PA peak pressure: 42 mm Hg (S).  Impressions:  - Compared to the prior study, there has been no significant interval change.     Neuro/Psych  Headaches  Anxiety Depression Bipolar Disorder Schizophrenia     GI/Hepatic ,GERD  Medicated and Controlled,,  Endo/Other  diabetes    Renal/GU      Musculoskeletal  (+) Arthritis , Osteoarthritis,    Abdominal  (+) + obese  Peds  Hematology   Anesthesia Other Findings HTN (hypertension) Bipolar 1 disorder (HCC)   OA (osteoarthritis)  GERD (gastroesophageal reflux  disease)     Hypercholesterolemia           Migraines   Schizo-affective psychosis (HCC         Reproductive/Obstetrics                             Anesthesia Physical Anesthesia Plan  ASA: III  Anesthesia Plan: General   Post-op Pain Management:  Regional for Post-op pain and Regional block*, Tylenol PO (pre-op)* and Minimal or no pain anticipated   Induction: Intravenous  PONV Risk Score and Plan: 2 and Ondansetron, Midazolam and Treatment may vary due to age or medical condition  Airway Management Planned: Oral ETT and LMA  Additional Equipment:   Intra-op Plan:   Post-operative Plan: Extubation in OR  Informed Consent: I have reviewed the patients History and Physical, chart, labs and discussed the procedure including the risks, benefits and alternatives for the proposed anesthesia with the patient or authorized representative who has indicated his/her understanding and acceptance.     Dental Advisory Given  Plan Discussed with: CRNA and Surgeon  Anesthesia Plan Comments: (Discussed Brachial Plexus block as pt had done before Pt aggrees to block)        Anesthesia Quick Evaluation

## 2022-12-01 NOTE — Progress Notes (Signed)
Assisted Dr. Sabra Heck with left, interscalene , ultrasound guided block. Side rails up, monitors on throughout procedure. See vital signs in flow sheet. Tolerated Procedure well.

## 2022-12-01 NOTE — Interval H&P Note (Signed)
History and Physical Interval Note:  12/01/2022 7:00 AM  Gloria Lewis  has presented today for surgery, with the diagnosis of LEFT SHOULDER BURSITIS, BICIPITAL TENDONITIS.  The various methods of treatment have been discussed with the patient and family. After consideration of risks, benefits and other options for treatment, the patient has consented to  Procedure(s): SHOULDER ARTHROSCOPY WITH SUBACROMIAL DECOMPRESSION AND DISTAL CLAVICLE EXCISION (Left) ARTHROSCOPY SHOULDER, BICEPS TENODESIS, POSSIBLE ROTATOR CUFF REPAIR (Left) as a surgical intervention.  The patient's history has been reviewed, patient examined, no change in status, stable for surgery.  I have reviewed the patient's chart and labs.  Questions were answered to the patient's satisfaction.     Sheral Apley

## 2022-12-01 NOTE — Discharge Instructions (Addendum)
No tylenol until 1:20 p.m.  Post Anesthesia Home Care Instructions  Activity: Get plenty of rest for the remainder of the day. A responsible individual must stay with you for 24 hours following the procedure.  For the next 24 hours, DO NOT: -Drive a car -Advertising copywriter -Drink alcoholic beverages -Take any medication unless instructed by your physician -Make any legal decisions or sign important papers.  Meals: Start with liquid foods such as gelatin or soup. Progress to regular foods as tolerated. Avoid greasy, spicy, heavy foods. If nausea and/or vomiting occur, drink only clear liquids until the nausea and/or vomiting subsides. Call your physician if vomiting continues.  Special Instructions/Symptoms: Your throat may feel dry or sore from the anesthesia or the breathing tube placed in your throat during surgery. If this causes discomfort, gargle with warm salt water. The discomfort should disappear within 24 hours.  If you had a scopolamine patch placed behind your ear for the management of post- operative nausea and/or vomiting:  1. The medication in the patch is effective for 72 hours, after which it should be removed.  Wrap patch in a tissue and discard in the trash. Wash hands thoroughly with soap and water. 2. You may remove the patch earlier than 72 hours if you experience unpleasant side effects which may include dry mouth, dizziness or visual disturbances. 3. Avoid touching the patch. Wash your hands with soap and water after contact with the patch.      Regional Anesthesia Blocks  1. You may not be able to move or feel the "blocked" extremity after a regional anesthetic block. This may last may last from 3-48 hours after placement, but it will go away. The length of time depends on the medication injected and your individual response to the medication. As the nerves start to wake up, you may experience tingling as the movement and feeling returns to your extremity. If the  numbness and inability to move your extremity has not gone away after 48 hours, please call your surgeon.   2. The extremity that is blocked will need to be protected until the numbness is gone and the strength has returned. Because you cannot feel it, you will need to take extra care to avoid injury. Because it may be weak, you may have difficulty moving it or using it. You may not know what position it is in without looking at it while the block is in effect.  3. For blocks in the legs and feet, returning to weight bearing and walking needs to be done carefully. You will need to wait until the numbness is entirely gone and the strength has returned. You should be able to move your leg and foot normally before you try and bear weight or walk. You will need someone to be with you when you first try to ensure you do not fall and possibly risk injury.  4. Bruising and tenderness at the needle site are common side effects and will resolve in a few days.  5. Persistent numbness or new problems with movement should be communicated to the surgeon or the [facility] [phone] [facility] [phone]).   Information for Discharge Teaching: EXPAREL (bupivacaine liposome injectable suspension)   Pain relief is important to your recovery. The goal is to control your pain so you can move easier and return to your normal activities as soon as possible after your procedure. Your physician may use several types of medicines to manage pain, swelling, and  more.  Your surgeon or anesthesiologist gave you EXPAREL(bupivacaine) to help control your pain after surgery.  EXPAREL is a local anesthetic designed to release slowly over an extended period of time to provide pain relief by numbing the tissue around the surgical site. EXPAREL is designed to release pain medication over time and can control pain for up to 72 hours. Depending on how you respond to EXPAREL, you may require less  pain medication during your recovery. EXPAREL can help reduce or eliminate the need for opioids during the first few days after surgery when pain relief is needed the most. EXPAREL is not an opioid and is not addictive. It does not cause sleepiness or sedation.   Important! A teal colored band has been placed on your arm with the date, time and amount of EXPAREL you have received. Please leave this armband in place for the full 96 hours following administration, and then you may remove the band. If you return to the hospital for any reason within 96 hours following the administration of EXPAREL, the armband provides important information that your health care providers to know, and alerts them that you have received this anesthetic.    Possible side effects of EXPAREL: Temporary loss of sensation or ability to move in the area where medication was injected. Nausea, vomiting, constipation Rarely, numbness and tingling in your mouth or lips, lightheadedness, or anxiety may occur. Call your doctor right away if you think you may be experiencing any of these sensations, or if you have other questions regarding possible side effects.  Follow all other discharge instructions given to you by your surgeon or nurse. Eat a healthy diet and drink plenty of water or other fluids.   Next dose of Tylenol may be given at 1:15pm if needed.  Regional Anesthesia Blocks  1. You may not be able to move or feel the "blocked" extremity after a regional anesthetic block. This may last may last from 3-48 hours after placement, but it will go away. The length of time depends on the medication injected and your individual response to the medication. As the nerves start to wake up, you may experience tingling as the movement and feeling returns to your extremity. If the numbness and inability to move your extremity has not gone away after 48 hours, please call your surgeon.   2. The extremity that is blocked will need to  be protected until the numbness is gone and the strength has returned. Because you cannot feel it, you will need to take extra care to avoid injury. Because it may be weak, you may have difficulty moving it or using it. You may not know what position it is in without looking at it while the block is in effect.  3. For blocks in the legs and feet, returning to weight bearing and walking needs to be done carefully. You will need to wait until the numbness is entirely gone and the strength has returned. You should be able to move your leg and foot normally before you try and bear weight or walk. You will need someone to be with you when you first try to ensure you do not fall and possibly risk injury.  4. Bruising and tenderness at the needle site are common side effects and will resolve in a few days.  5. Persistent numbness or new problems with movement should be communicated to the surgeon or the [facility] [phone] [facility] [phone]).

## 2022-12-01 NOTE — Anesthesia Postprocedure Evaluation (Signed)
Anesthesia Post Note  Patient: Gloria Lewis  Procedure(s) Performed: SHOULDER ARTHROSCOPY WITH SUBACROMIAL DECOMPRESSION AND DISTAL CLAVICLE EXCISION, LOOSE BODY REMOVAL (Left: Shoulder) ARTHROSCOPY SHOULDER, BICEPS TENODESIS, ROTATOR CUFF REPAIR (Left: Shoulder)     Patient location during evaluation: PACU Anesthesia Type: General Level of consciousness: awake and alert Pain management: pain level controlled Vital Signs Assessment: post-procedure vital signs reviewed and stable Respiratory status: spontaneous breathing, nonlabored ventilation and respiratory function stable Cardiovascular status: blood pressure returned to baseline and stable Postop Assessment: no apparent nausea or vomiting Anesthetic complications: no   No notable events documented.  Last Vitals:  Vitals:   12/01/22 1145 12/01/22 1200  BP: 110/75 109/75  Pulse: 78 79  Resp: 12 20  Temp:    SpO2: 99% 93%    Last Pain:  Vitals:   12/01/22 1124  TempSrc:   PainSc: 0-No pain                 Lowella Curb

## 2022-12-01 NOTE — Anesthesia Procedure Notes (Signed)
Anesthesia Regional Block: Interscalene brachial plexus block   Pre-Anesthetic Checklist: , timeout performed,  Correct Patient, Correct Site, Correct Laterality,  Correct Procedure, Correct Position, site marked,  Risks and benefits discussed,  Surgical consent,  Pre-op evaluation,  At surgeon's request and post-op pain management  Laterality: Left  Prep: chloraprep       Needles:  Injection technique: Single-shot  Needle Type: Stimiplex     Needle Length: 9cm  Needle Gauge: 21     Additional Needles:   Procedures:,,,, ultrasound used (permanent image in chart),,    Narrative:  Start time: 12/01/2022 7:50 AM End time: 12/01/2022 7:55 AM Injection made incrementally with aspirations every 5 mL.  Performed by: Personally  Anesthesiologist: Lowella Curb, MD

## 2022-12-01 NOTE — Transfer of Care (Signed)
Immediate Anesthesia Transfer of Care Note  Patient: Gloria Lewis  Procedure(s) Performed: SHOULDER ARTHROSCOPY WITH SUBACROMIAL DECOMPRESSION AND DISTAL CLAVICLE EXCISION, LOOSE BODY REMOVAL (Left: Shoulder) ARTHROSCOPY SHOULDER, BICEPS TENODESIS, ROTATOR CUFF REPAIR (Left: Shoulder)  Patient Location: PACU  Anesthesia Type:GA combined with regional for post-op pain  Level of Consciousness: drowsy  Airway & Oxygen Therapy: Patient Spontanous Breathing and Patient connected to face mask oxygen  Post-op Assessment: Report given to RN and Post -op Vital signs reviewed and stable  Post vital signs: Reviewed and stable  Last Vitals:  Vitals Value Taken Time  BP 132/90 12/01/22 1124  Temp 36.6 C 12/01/22 1124  Pulse 83 12/01/22 1126  Resp 26 12/01/22 1126  SpO2 97 % 12/01/22 1126  Vitals shown include unfiled device data.  Last Pain:  Vitals:   12/01/22 0711  TempSrc: Temporal  PainSc: 0-No pain      Patients Stated Pain Goal: 3 (12/01/22 0711)  Complications: No notable events documented.

## 2022-12-01 NOTE — Anesthesia Procedure Notes (Signed)
Procedure Name: Intubation Date/Time: 12/01/2022 9:32 AM  Performed by: Lauralyn Primes, CRNAPre-anesthesia Checklist: Patient identified, Emergency Drugs available, Suction available and Patient being monitored Patient Re-evaluated:Patient Re-evaluated prior to induction Oxygen Delivery Method: Circle system utilized Preoxygenation: Pre-oxygenation with 100% oxygen Induction Type: IV induction Ventilation: Mask ventilation without difficulty and Oral airway inserted - appropriate to patient size Laryngoscope Size: Mac and 3 Grade View: Grade I Tube type: Oral Tube size: 7.0 mm Number of attempts: 1 Airway Equipment and Method: Stylet, Oral airway and Bite block Placement Confirmation: ETT inserted through vocal cords under direct vision, positive ETCO2 and breath sounds checked- equal and bilateral Secured at: 22 cm Tube secured with: Tape Dental Injury: Teeth and Oropharynx as per pre-operative assessment

## 2022-12-02 ENCOUNTER — Encounter (HOSPITAL_BASED_OUTPATIENT_CLINIC_OR_DEPARTMENT_OTHER): Payer: Self-pay | Admitting: Orthopedic Surgery

## 2022-12-04 DIAGNOSIS — M25612 Stiffness of left shoulder, not elsewhere classified: Secondary | ICD-10-CM | POA: Diagnosis not present

## 2022-12-04 DIAGNOSIS — S46012D Strain of muscle(s) and tendon(s) of the rotator cuff of left shoulder, subsequent encounter: Secondary | ICD-10-CM | POA: Diagnosis not present

## 2022-12-04 DIAGNOSIS — M6281 Muscle weakness (generalized): Secondary | ICD-10-CM | POA: Diagnosis not present

## 2022-12-11 DIAGNOSIS — M19012 Primary osteoarthritis, left shoulder: Secondary | ICD-10-CM | POA: Diagnosis not present

## 2022-12-17 DIAGNOSIS — M25612 Stiffness of left shoulder, not elsewhere classified: Secondary | ICD-10-CM | POA: Diagnosis not present

## 2022-12-17 DIAGNOSIS — M6281 Muscle weakness (generalized): Secondary | ICD-10-CM | POA: Diagnosis not present

## 2022-12-17 DIAGNOSIS — S46012D Strain of muscle(s) and tendon(s) of the rotator cuff of left shoulder, subsequent encounter: Secondary | ICD-10-CM | POA: Diagnosis not present

## 2022-12-23 ENCOUNTER — Other Ambulatory Visit (HOSPITAL_COMMUNITY): Payer: Self-pay | Admitting: Psychiatry

## 2022-12-24 DIAGNOSIS — Z79899 Other long term (current) drug therapy: Secondary | ICD-10-CM | POA: Diagnosis not present

## 2022-12-24 DIAGNOSIS — E7800 Pure hypercholesterolemia, unspecified: Secondary | ICD-10-CM | POA: Diagnosis not present

## 2022-12-24 DIAGNOSIS — R7303 Prediabetes: Secondary | ICD-10-CM | POA: Diagnosis not present

## 2022-12-24 DIAGNOSIS — N183 Chronic kidney disease, stage 3 unspecified: Secondary | ICD-10-CM | POA: Diagnosis not present

## 2022-12-24 DIAGNOSIS — R0602 Shortness of breath: Secondary | ICD-10-CM | POA: Diagnosis not present

## 2022-12-24 DIAGNOSIS — F17210 Nicotine dependence, cigarettes, uncomplicated: Secondary | ICD-10-CM | POA: Diagnosis not present

## 2022-12-24 DIAGNOSIS — Z1231 Encounter for screening mammogram for malignant neoplasm of breast: Secondary | ICD-10-CM | POA: Diagnosis not present

## 2022-12-24 DIAGNOSIS — E559 Vitamin D deficiency, unspecified: Secondary | ICD-10-CM | POA: Diagnosis not present

## 2022-12-24 DIAGNOSIS — M5136 Other intervertebral disc degeneration, lumbar region: Secondary | ICD-10-CM | POA: Diagnosis not present

## 2022-12-24 DIAGNOSIS — I10 Essential (primary) hypertension: Secondary | ICD-10-CM | POA: Diagnosis not present

## 2022-12-25 DIAGNOSIS — M6281 Muscle weakness (generalized): Secondary | ICD-10-CM | POA: Diagnosis not present

## 2022-12-25 DIAGNOSIS — S46012D Strain of muscle(s) and tendon(s) of the rotator cuff of left shoulder, subsequent encounter: Secondary | ICD-10-CM | POA: Diagnosis not present

## 2022-12-25 DIAGNOSIS — M25612 Stiffness of left shoulder, not elsewhere classified: Secondary | ICD-10-CM | POA: Diagnosis not present

## 2022-12-28 DIAGNOSIS — Z79899 Other long term (current) drug therapy: Secondary | ICD-10-CM | POA: Diagnosis not present

## 2022-12-29 ENCOUNTER — Other Ambulatory Visit: Payer: Self-pay

## 2022-12-29 ENCOUNTER — Ambulatory Visit (HOSPITAL_BASED_OUTPATIENT_CLINIC_OR_DEPARTMENT_OTHER): Payer: 59 | Admitting: Psychiatry

## 2022-12-29 ENCOUNTER — Encounter (HOSPITAL_COMMUNITY): Payer: Self-pay | Admitting: Psychiatry

## 2022-12-29 VITALS — BP 157/77 | HR 88 | Ht 67.0 in | Wt 241.0 lb

## 2022-12-29 DIAGNOSIS — F329 Major depressive disorder, single episode, unspecified: Secondary | ICD-10-CM | POA: Diagnosis not present

## 2022-12-29 MED ORDER — MIRTAZAPINE 30 MG PO TABS: 30.0000 mg | ORAL_TABLET | Freq: Every day | ORAL | 5 refills | Status: DC

## 2022-12-29 MED ORDER — TEMAZEPAM 15 MG PO CAPS: 15.0000 mg | ORAL_CAPSULE | Freq: Every evening | ORAL | 3 refills | Status: DC | PRN

## 2022-12-29 MED ORDER — CLONAZEPAM 1 MG PO TABS: ORAL_TABLET | ORAL | 2 refills | Status: DC

## 2022-12-29 MED ORDER — NORTRIPTYLINE HCL 50 MG PO CAPS: ORAL_CAPSULE | ORAL | 4 refills | Status: DC

## 2022-12-29 NOTE — Progress Notes (Signed)
Patient ID: Gloria Lewis, female   DOB: 1956/01/22, 67 y.o.   MRN: [id number] [facility] MD Progress Note  12/29/2022 3:25 PM KAELE OPLINGER  MRN:  [id number] Subjective:  Shoulder hurting Principal Problem: Major Depression,recurent Mild Diagnosis: Adjustment disorder with an anxious mood stat     The patient is having a lot happen.  She does have her left shoulder surgery.  Her son Gerri Spore is going to be removed from her home in the next 48 hours.  She is going to give up guardianship for him.  She is exhausted from caring for him.  He apparently is agitated and keeps her up at night.  He is very unreliable.  She has 1 good son is always there for her which is great.  At this point the patient is resigned and stopped caring for wrestling.  He will be with an ACT team and she will be out of the loop.  She has great difficulty getting to sleep.  She is eating okay.  She is on multiple psychotropic medications.  She takes Klonopin 1 mg 1 in the morning and 2 at night.  She had a drug screen on her on her last visit was negative.  Patient denies the use of alcohol or drugs.  I have known her for years and I believe her.  She takes 30 mg of Remeron and 50 mg of nortriptyline.  The patient had cataract surgery which has not gone well.  She is the ophthalmologist in the next 24 hours.  She is wearing a sling for her shoulder.  The patient's mood is fairly good.  She attributed this to combination of nortriptyline and Remeron. Patient Active Problem List   Diagnosis Date Noted   Partial nontraumatic tear of left rotator cuff [M75.112] 12/01/2022   Infectious gastroenteritis [A09] 04/16/2016   Xerostomia [K11.7] 03/09/2016   Surgery, elective [Z41.9] 05/23/2015   S/P arthroscopy of shoulder [Z98.890] 05/23/2015   Chronic migraine without aura without status migrainosus, not intractable [G43.709] 10/18/2014   Tobacco abuse [Z72.0] 10/18/2014   Obesity [E66.9] 09/23/2014   COPD [J44.9] 09/02/2014    Pulmonary hypertension (HCC) [I27.20] 08/31/2014   Respiratory failure with hypoxia (HCC) [J96.91] 08/14/2014   Cigarette smoker [F17.210] 07/28/2014   Major depressive disorder, recurrent episode, moderate (HCC) [F33.1] 07/06/2014   Essential hypertension [I10]    SOB (shortness of breath) [R06.02] 06/21/2014   Precordial pain [R07.2] 06/21/2014   Gastroesophageal reflux disease [K21.9] 06/21/2014   Chest pain [R07.9] 06/21/2014   HTN (hypertension) [I10]    Neck pain [M54.2] 01/08/2014   Major depressive disorder, recurrent episode, severe, without mention of psychotic behavior [F33.2] 10/14/2012   Schizoaffective disorder (HCC) [F25.9] 05/26/2012   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] 10/06/2010   Hyperparathyroidism, primary (HCC) [E21.0] 10/06/2010   DEGENERATIVE DISC DISEASE, LUMBOSACRAL SPINE [M51.37] 05/21/2007   DERMATOPHYTOSIS OF THE BODY [B35.4] 05/10/2007   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] 03/24/2007   Anxiety state [F41.1] 03/24/2007   DENTAL PAIN [K08.9] 03/24/2007   SHOULDER PAIN, LEFT [M25.519] 03/24/2007   Total Time spent with patient:30 min  Past Psychiatric History:   Past Medical History:  Past Medical History:  Diagnosis Date   Anginal pain (HCC)    admit 06/2014; had non-ischemic stress test   Anxiety    Bipolar 1 disorder (HCC)    Colon polyp    Complication of anesthesia    pt reports hx of waking up during anesthesia   CTS (carpal tunnel syndrome)    Depression  Diabetes mellitus without complication (HCC)    Fever blister    GERD (gastroesophageal reflux disease)    HA (headache)    HTN (hypertension)    Hypercholesterolemia    Migraines    OA (osteoarthritis)    Schizo-affective psychosis (HCC)     Past Surgical History:  Procedure Laterality Date   ANTERIOR CERVICAL DECOMP/DISCECTOMY FUSION  08/27/2011   Procedure: ANTERIOR CERVICAL DECOMPRESSION/DISCECTOMY FUSION 1 LEVEL/HARDWARE REMOVAL;  Surgeon: Tia Alert, MD;  Location: [facility] NEURO ORS;   Service: Neurosurgery;  Laterality: Bilateral;  Cervical four-five Anterior cervical decompression/diskectomy, fusion, Plate, Removal of Cervical five-seven Plate   back injection     CARDIAC CATHETERIZATION N/A 11/09/2014   Procedure: Right Heart Cath;  Surgeon: Laurey Morale, MD;  Location: [facility] INVASIVE CV LAB;  Service: Cardiovascular;  Laterality: N/A;   CARPAL TUNNEL RELEASE  20110 rt/lt   rt x2 , lt x1   COLONOSCOPY  06/2017   [facility]   ESOPHAGOGASTRODUODENOSCOPY  06/2017   [facility]   HEMORRHOID SURGERY     MULTIPLE TOOTH EXTRACTIONS     NECK SURGERY  2009   PITUITARY SURGERY     Had gland removed from producing too much calcium   polp removed  2011   RIGHT/LEFT HEART CATH AND CORONARY ANGIOGRAPHY N/A 07/05/2017   Procedure: RIGHT/LEFT HEART CATH AND CORONARY ANGIOGRAPHY;  Surgeon: Laurey Morale, MD;  Location: [facility] INVASIVE CV LAB;  Service: Cardiovascular;  Laterality: N/A;   SHOULDER ARTHROSCOPY Left 12/01/2022   Procedure: ARTHROSCOPY SHOULDER, BICEPS TENODESIS, ROTATOR CUFF REPAIR;  Surgeon: Sheral Apley, MD;  Location: [facility];  Service: Orthopedics;  Laterality: Left;   SHOULDER ARTHROSCOPY WITH ROTATOR CUFF REPAIR Right 05/23/2015   Procedure: RIGHT SHOULDER ARTHROSCOPY WITH REMOVAL OF SUTURE ANCHOR AND POSSIBLE REVISION ROTATOR CUFF REPAIR;  Surgeon: Francena Hanly, MD;  Location: [facility] OR;  Service: Orthopedics;  Laterality: Right;   SHOULDER ARTHROSCOPY WITH SUBACROMIAL DECOMPRESSION Right 01/24/2015   Procedure: RIGHT SHOULDER ARTHROSCOPY WITH SUBACROMIAL DECOMPRESSION AD DISTAL CLAVICLE RESECTION ;  Surgeon: Francena Hanly, MD;  Location: [facility] OR;  Service: Orthopedics;  Laterality: Right;   VAGINAL DELIVERY     x3   Family History:  Family History  Problem Relation Age of Onset   Coronary artery disease Father    Cancer Father        head neck    Esophageal cancer Father    Hypertension Mother    Schizophrenia Mother     Diabetes Mother    Heart attack Mother    Depression Brother    Suicidality Brother    Prostate cancer Brother    Cancer Brother        bone marrow   Schizophrenia Maternal Grandmother    Anesthesia problems Neg Hx    Hypotension Neg Hx    Malignant hyperthermia Neg Hx    Pseudochol deficiency Neg Hx    Allergic rhinitis Neg Hx    Angioedema Neg Hx    Asthma Neg Hx    Atopy Neg Hx    Eczema Neg Hx    Immunodeficiency Neg Hx    Urticaria Neg Hx    Breast cancer Neg Hx    Family Psychiatric  History:  Social History:  Social History   Substance and Sexual Activity  Alcohol Use No   Alcohol/week: 0.0 standard drinks of alcohol     Social History   Substance and Sexual Activity  Drug Use No  Comment: hx crack addiction 2008    Social History   Socioeconomic History   Marital status: Single    Spouse name: Not on file   Number of children: 3   Years of education: 12   Highest education level: Some college, no degree  Occupational History   Occupation: disabled/retired  Tobacco Use   Smoking status: Some Days    Types: Cigars   Smokeless tobacco: Never  Vaping Use   Vaping status: Some Days  Substance and Sexual Activity   Alcohol use: No    Alcohol/week: 0.0 standard drinks of alcohol   Drug use: No    Comment: hx crack addiction 2008   Sexual activity: Never  Other Topics Concern   Not on file  Social History Narrative   Lives in a two story home alone      [place] [facility] [place] [facility]      Right handed   12th grade      Caffeine coffee 1 cup /day   Social Determinants of Health   Financial Resource Strain: Medium Risk (03/12/2017)   Overall Financial Resource Strain (CARDIA)    Difficulty of Paying Living Expenses: Somewhat hard  Food Insecurity: Food Insecurity Present (03/12/2017)   Hunger Vital Sign    Worried About Running Out of Food in the Last Year: Sometimes true    Ran Out of Food in the Last Year: Sometimes true   Transportation Needs: Unmet Transportation Needs (03/12/2017)   PRAPARE - Transportation    Lack of Transportation (Medical): Yes    Lack of Transportation (Non-Medical): Yes  Physical Activity: Inactive (03/12/2017)   Exercise Vital Sign    Days of Exercise per Week: 0 days    Minutes of Exercise per Session: 0 min  Stress: Stress Concern Present (03/12/2017)   [employer] of Occupational Health - Occupational Stress Questionnaire    Feeling of Stress : Rather much  Social Connections: Moderately Integrated (03/12/2017)   Social Connection and Isolation Panel [NHANES]    Frequency of Communication with Friends and Family: More than three times a week    Frequency of Social Gatherings with Friends and Family: More than three times a week    Attends Religious Services: More than 4 times per year    Active Member of [employer] or Organizations: Yes    Attends Engineer, structural: More than 4 times per year    Marital Status: Never married   Additional Social History:                         Sleep: Good  Appetite:  Fair  Current Medications: Current Outpatient Medications  Medication Sig Dispense Refill   acetaminophen (TYLENOL) 500 MG tablet Take 2 tablets (1,000 mg total) by mouth every 8 (eight) hours as needed for mild pain or moderate pain. 60 tablet 0   ASHWAGANDHA PO Take 1 capsule by mouth 2 (two) times daily.     aspirin EC 81 MG tablet Take 1 tablet (81 mg total) by mouth 2 (two) times daily. To prevent blood clots for 30 days after surgery. 60 tablet 0   azelastine (ASTELIN) 0.1 % nasal spray Place 1 spray into both nostrils 2 (two) times daily.     baclofen (LIORESAL) 10 MG tablet Take 1 tablet (10 mg total) by mouth every 8 (eight) hours as needed for muscle spasms. 21 tablet 0   cetirizine (ZYRTEC ALLERGY) 10 MG tablet Take 1  tablet (10 mg total) by mouth daily. 90 tablet 0   cholecalciferol (VITAMIN D) 1000 UNITS tablet Take 1,000 Units by mouth  daily.     DEXILANT 30 MG capsule Take 1 capsule by mouth daily.     diclofenac Sodium (VOLTAREN) 1 % GEL Apply topically 4 (four) times daily.     diphenhydrAMINE (BENADRYL) 25 MG tablet 1 tablet as needed     Echinacea-Goldenseal LIQD Place 1 drop into the nose daily as needed.     Ginger, Zingiber officinalis, (GINGER EXTRACT PO) Take by mouth.     hydrocortisone (ANUSOL-HC) 2.5 % rectal cream PLACE 1 APPLICATION IN RECTUM 3 TIMES A DAY     lidocaine (LIDODERM) 5 % Place 1 patch onto the skin daily. Remove & Discard patch within 12 hours or as directed by MD 30 patch 0   Magnesium 500 MG TABS 1 tablet     meloxicam (MOBIC) 15 MG tablet Take 1 tablet (15 mg total) by mouth daily as needed for pain (and inflammation). 30 tablet 0   NON FORMULARY 2 (two) times daily. [place]     Omega-3 Fatty Acids (FISH OIL PO) Take by mouth 2 (two) times a day.     ondansetron (ZOFRAN) 8 MG tablet Take 1 tablet (8 mg total) by mouth every 8 (eight) hours as needed for nausea or vomiting. 20 tablet 3   oxyCODONE-acetaminophen (PERCOCET) 10-325 MG tablet Take 1 tablet by mouth 2 (two) times daily.     potassium chloride SA (K-DUR,KLOR-CON) 20 MEQ tablet Take 20 mEq by mouth 2 (two) times daily.     promethazine-dextromethorphan (PROMETHAZINE-DM) 6.25-15 MG/5ML syrup 5 ml as needed     rosuvastatin (CRESTOR) 10 MG tablet 1 tablet     temazepam (RESTORIL) 15 MG capsule Take 1 capsule (15 mg total) by mouth at bedtime as needed for sleep. 30 capsule 3   topiramate (TOPAMAX) 100 MG tablet Take 1 tablet (100 mg total) by mouth 2 (two) times daily. 180 tablet 3   triamterene-hydrochlorothiazide (DYAZIDE) 50-25 MG capsule Take 1 capsule by mouth daily.     TURMERIC PO Take by mouth as needed.     Ubrogepant (UBRELVY) 50 MG TABS Take one tablet onset migraine, may repeat in 2 hours if needed (max 2 tabs/24 hours) 10 tablet 11   valACYclovir (VALTREX) 1000 MG tablet Take 1,000 mg by mouth daily.     vitamin B-12  (CYANOCOBALAMIN) 1000 MCG tablet Take 1,000 mcg by mouth daily.     clonazePAM (KLONOPIN) 1 MG tablet Take 1 tablet qam & 2 tablets qhs 90 tablet 2   mirtazapine (REMERON) 30 MG tablet Take 1 tablet (30 mg total) by mouth at bedtime. 30 tablet 5   nortriptyline (PAMELOR) 50 MG capsule 2 qhs 60 capsule 4   No current facility-administered medications for this visit.    Lab Results: No results found for this or any previous visit (from the past 48 hour(s)).  Physical Findings: AIMS:  , ,  ,  ,    CIWA:    COWS:     Musculoskeletal: Strength & Muscle Tone: within normal limits Gait & Station: normal Patient leans: N/A  Psychiatric Specialty Exam: ROS  Blood pressure (!) 157/77, pulse 88, height 5\' 7"  (1.702 m), weight 241 lb (109.3 kg).Body mass index is 37.75 kg/m.  General Appearance: Casual  Eye Contact::  Good  Speech:  Clear and Coherent  Volume:  Normal  Mood:  Euthymic  Affect:  Congruent  Thought Process:  Coherent  Orientation:  Full (Time, Place, and Person)  Thought Content:  WDL  Suicidal Thoughts:  No  Homicidal Thoughts:  No  Memory:  NA  Judgement:  Good  Insight:  Fair  Psychomotor Activity:  Normal  Concentration:  Fair  Recall:  Good  Fund of Knowledge:Good  Language: Good  Akathisia:  No  Handed:  Right  AIMS (if indicated):     Assets:   ADL's:  Intact  Cognition: WNL  Sleep:       Treatment Plan  12/29/2022, 3:25 PM   This patient's first problem is that of major depression.  She will continue taking nortriptyline 50 mg and Remeron 30 mg.  Her second problem is an adjustment disorder with an anxious mood state.  She will continue taking Klonopin 1 mg 1 in the morning and 2 at night her third problem is insomnia.  Over the combination to Klonopin is at night together with Restoril 15 mg she will sleep well.  She is taking Restoril in the past and it worked well.  This patient to return to see me in 2 months.  It also is noted that she takes  OxyContin that she gets that from [facility] [place].

## 2022-12-30 DIAGNOSIS — H25812 Combined forms of age-related cataract, left eye: Secondary | ICD-10-CM | POA: Diagnosis not present

## 2022-12-30 DIAGNOSIS — H10413 Chronic giant papillary conjunctivitis, bilateral: Secondary | ICD-10-CM | POA: Diagnosis not present

## 2022-12-30 DIAGNOSIS — H04123 Dry eye syndrome of bilateral lacrimal glands: Secondary | ICD-10-CM | POA: Diagnosis not present

## 2022-12-30 DIAGNOSIS — E119 Type 2 diabetes mellitus without complications: Secondary | ICD-10-CM | POA: Diagnosis not present

## 2022-12-30 DIAGNOSIS — Z961 Presence of intraocular lens: Secondary | ICD-10-CM | POA: Diagnosis not present

## 2023-01-13 DIAGNOSIS — S46012D Strain of muscle(s) and tendon(s) of the rotator cuff of left shoulder, subsequent encounter: Secondary | ICD-10-CM | POA: Diagnosis not present

## 2023-01-21 DIAGNOSIS — N183 Chronic kidney disease, stage 3 unspecified: Secondary | ICD-10-CM | POA: Diagnosis not present

## 2023-01-21 DIAGNOSIS — E538 Deficiency of other specified B group vitamins: Secondary | ICD-10-CM | POA: Diagnosis not present

## 2023-01-21 DIAGNOSIS — R7303 Prediabetes: Secondary | ICD-10-CM | POA: Diagnosis not present

## 2023-01-21 DIAGNOSIS — E7800 Pure hypercholesterolemia, unspecified: Secondary | ICD-10-CM | POA: Diagnosis not present

## 2023-01-21 DIAGNOSIS — Z79899 Other long term (current) drug therapy: Secondary | ICD-10-CM | POA: Diagnosis not present

## 2023-01-21 DIAGNOSIS — M51369 Other intervertebral disc degeneration, lumbar region without mention of lumbar back pain or lower extremity pain: Secondary | ICD-10-CM | POA: Diagnosis not present

## 2023-01-21 DIAGNOSIS — M8580 Other specified disorders of bone density and structure, unspecified site: Secondary | ICD-10-CM | POA: Diagnosis not present

## 2023-01-21 DIAGNOSIS — F17210 Nicotine dependence, cigarettes, uncomplicated: Secondary | ICD-10-CM | POA: Diagnosis not present

## 2023-01-21 DIAGNOSIS — I10 Essential (primary) hypertension: Secondary | ICD-10-CM | POA: Diagnosis not present

## 2023-01-21 DIAGNOSIS — E559 Vitamin D deficiency, unspecified: Secondary | ICD-10-CM | POA: Diagnosis not present

## 2023-01-25 DIAGNOSIS — Z79899 Other long term (current) drug therapy: Secondary | ICD-10-CM | POA: Diagnosis not present

## 2023-01-27 ENCOUNTER — Ambulatory Visit (HOSPITAL_BASED_OUTPATIENT_CLINIC_OR_DEPARTMENT_OTHER): Payer: 59 | Admitting: Psychiatry

## 2023-01-27 DIAGNOSIS — F325 Major depressive disorder, single episode, in full remission: Secondary | ICD-10-CM

## 2023-01-27 MED ORDER — CLONAZEPAM 1 MG PO TABS: ORAL_TABLET | ORAL | 3 refills | Status: DC

## 2023-01-27 MED ORDER — TEMAZEPAM 15 MG PO CAPS: 15.0000 mg | ORAL_CAPSULE | Freq: Every evening | ORAL | 3 refills | Status: DC | PRN

## 2023-01-27 MED ORDER — NORTRIPTYLINE HCL 50 MG PO CAPS: ORAL_CAPSULE | ORAL | 4 refills | Status: DC

## 2023-01-27 NOTE — Progress Notes (Signed)
Patient ID: Gloria Lewis, female   DOB: 12/15/1955, 67 y.o.   MRN: [id number] [facility] MD Progress Note  01/27/2023 2:44 PM Gloria Lewis  MRN:  [id number] Subjective:  Shoulder hurting Principal Problem: Major Depression,recurent Mild Diagnosis: Adjustment disorder with an anxious mood stat      Today the patient is seen in the office.  Her left shoulder is slowly getting better.  Gerri Spore her son is yet to move but to move in about 2 to 3 weeks.  When asked Tarry Kos the patient will finish a paper and get her degree in psychology from [employer].  The patient is doing very well.  She takes her medicines as prescribed.  On her arms better not only will she get back to doing things to get back to swimming.  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  She is positive and optimistic. Patient Active Problem List   Diagnosis Date Noted   Partial nontraumatic tear of left rotator cuff [M75.112] 12/01/2022   Infectious gastroenteritis [A09] 04/16/2016   Xerostomia [K11.7] 03/09/2016   Surgery, elective [Z41.9] 05/23/2015   S/P arthroscopy of shoulder [Z98.890] 05/23/2015   Chronic migraine without aura without status migrainosus, not intractable [G43.709] 10/18/2014   Tobacco abuse [Z72.0] 10/18/2014   Obesity [E66.9] 09/23/2014   COPD [J44.9] 09/02/2014   Pulmonary hypertension (HCC) [I27.20] 08/31/2014   Respiratory failure with hypoxia (HCC) [J96.91] 08/14/2014   Cigarette smoker [F17.210] 07/28/2014   Major depressive disorder, recurrent episode, moderate (HCC) [F33.1] 07/06/2014   Essential hypertension [I10]    SOB (shortness of breath) [R06.02] 06/21/2014   Precordial pain [R07.2] 06/21/2014   Gastroesophageal reflux disease [K21.9] 06/21/2014   Chest pain [R07.9] 06/21/2014   HTN (hypertension) [I10]    Neck pain [M54.2] 01/08/2014   Severe episode of recurrent major depressive disorder (HCC) [F33.2] 10/14/2012   Schizoaffective disorder (HCC)  [F25.9] 05/26/2012   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] 10/06/2010   Hyperparathyroidism, primary (HCC) [E21.0] 10/06/2010   DEGENERATIVE DISC DISEASE, LUMBOSACRAL SPINE [M51.379] 05/21/2007   DERMATOPHYTOSIS OF THE BODY [B35.4] 05/10/2007   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] 03/24/2007   Anxiety state [F41.1] 03/24/2007   DENTAL PAIN [K08.9] 03/24/2007   SHOULDER PAIN, LEFT [M25.519] 03/24/2007   Total Time spent with patient:30 min  Past Psychiatric History:   Past Medical History:  Past Medical History:  Diagnosis Date   Anginal pain (HCC)    admit 06/2014; had non-ischemic stress test   Anxiety    Bipolar 1 disorder (HCC)    Colon polyp    Complication of anesthesia    pt reports hx of waking up during anesthesia   CTS (carpal tunnel syndrome)    Depression    Diabetes mellitus without complication (HCC)    Fever blister    GERD (gastroesophageal reflux disease)    HA (headache)    HTN (hypertension)    Hypercholesterolemia    Migraines    OA (osteoarthritis)    Schizo-affective psychosis (HCC)     Past Surgical History:  Procedure Laterality Date   ANTERIOR CERVICAL DECOMP/DISCECTOMY FUSION  08/27/2011   Procedure: ANTERIOR CERVICAL DECOMPRESSION/DISCECTOMY FUSION 1 LEVEL/HARDWARE REMOVAL;  Surgeon: Tia Alert, MD;  Location: [facility];  Service: Neurosurgery;  Laterality: Bilateral;  Cervical four-five Anterior cervical decompression/diskectomy, fusion, Plate, Removal of Cervical five-seven Plate   back injection     CARDIAC CATHETERIZATION N/A 11/09/2014   Procedure: Right Heart Cath;  Surgeon: Laurey Morale, MD;  Location: [facility]  INVASIVE CV LAB;  Service: Cardiovascular;  Laterality: N/A;   CARPAL TUNNEL RELEASE  20110 rt/lt   rt x2 , lt x1   COLONOSCOPY  06/2017   [facility]   ESOPHAGOGASTRODUODENOSCOPY  06/2017   [facility]   HEMORRHOID SURGERY     MULTIPLE TOOTH EXTRACTIONS     NECK SURGERY  2009   PITUITARY SURGERY     Had gland  removed from producing too much calcium   polp removed  2011   RIGHT/LEFT HEART CATH AND CORONARY ANGIOGRAPHY N/A 07/05/2017   Procedure: RIGHT/LEFT HEART CATH AND CORONARY ANGIOGRAPHY;  Surgeon: Laurey Morale, MD;  Location: [facility] INVASIVE CV LAB;  Service: Cardiovascular;  Laterality: N/A;   SHOULDER ARTHROSCOPY Left 12/01/2022   Procedure: ARTHROSCOPY SHOULDER, BICEPS TENODESIS, ROTATOR CUFF REPAIR;  Surgeon: Sheral Apley, MD;  Location: [facility];  Service: Orthopedics;  Laterality: Left;   SHOULDER ARTHROSCOPY WITH ROTATOR CUFF REPAIR Right 05/23/2015   Procedure: RIGHT SHOULDER ARTHROSCOPY WITH REMOVAL OF SUTURE ANCHOR AND POSSIBLE REVISION ROTATOR CUFF REPAIR;  Surgeon: Francena Hanly, MD;  Location: [facility] OR;  Service: Orthopedics;  Laterality: Right;   SHOULDER ARTHROSCOPY WITH SUBACROMIAL DECOMPRESSION Right 01/24/2015   Procedure: RIGHT SHOULDER ARTHROSCOPY WITH SUBACROMIAL DECOMPRESSION AD DISTAL CLAVICLE RESECTION ;  Surgeon: Francena Hanly, MD;  Location: [facility];  Service: Orthopedics;  Laterality: Right;   VAGINAL DELIVERY     x3   Family History:  Family History  Problem Relation Age of Onset   Coronary artery disease Father    Cancer Father        head neck    Esophageal cancer Father    Hypertension Mother    Schizophrenia Mother    Diabetes Mother    Heart attack Mother    Depression Brother    Suicidality Brother    Prostate cancer Brother    Cancer Brother        bone marrow   Schizophrenia Maternal Grandmother    Anesthesia problems Neg Hx    Hypotension Neg Hx    Malignant hyperthermia Neg Hx    Pseudochol deficiency Neg Hx    Allergic rhinitis Neg Hx    Angioedema Neg Hx    Asthma Neg Hx    Atopy Neg Hx    Eczema Neg Hx    Immunodeficiency Neg Hx    Urticaria Neg Hx    Breast cancer Neg Hx    Family Psychiatric  History:  Social History:  Social History   Substance and Sexual Activity  Alcohol Use No   Alcohol/week: 0.0 standard drinks  of alcohol     Social History   Substance and Sexual Activity  Drug Use No   Comment: hx crack addiction 2008    Social History   Socioeconomic History   Marital status: Single    Spouse name: Not on file   Number of children: 3   Years of education: 12   Highest education level: Some college, no degree  Occupational History   Occupation: disabled/retired  Tobacco Use   Smoking status: Some Days    Types: Cigars   Smokeless tobacco: Never  Vaping Use   Vaping status: Some Days  Substance and Sexual Activity   Alcohol use: No    Alcohol/week: 0.0 standard drinks of alcohol   Drug use: No    Comment: hx crack addiction 2008   Sexual activity: Never  Other Topics Concern   Not on file  Social History  Narrative   Lives in a two story home alone      [facility] [place] [facility]      Right handed   12th grade      Caffeine coffee 1 cup /day   Social Determinants of Health   Financial Resource Strain: Medium Risk (03/12/2017)   Overall Financial Resource Strain (CARDIA)    Difficulty of Paying Living Expenses: Somewhat hard  Food Insecurity: Food Insecurity Present (03/12/2017)   Hunger Vital Sign    Worried About Running Out of Food in the Last Year: Sometimes true    Ran Out of Food in the Last Year: Sometimes true  Transportation Needs: Unmet Transportation Needs (03/12/2017)   PRAPARE - Administrator, Civil Service (Medical): Yes    Lack of Transportation (Non-Medical): Yes  Physical Activity: Inactive (03/12/2017)   Exercise Vital Sign    Days of Exercise per Week: 0 days    Minutes of Exercise per Session: 0 min  Stress: Stress Concern Present (03/12/2017)   [employer] of Occupational Health - Occupational Stress Questionnaire    Feeling of Stress : Rather much  Social Connections: Moderately Integrated (03/12/2017)   Social Connection and Isolation Panel [NHANES]    Frequency of Communication with Friends and Family: More than  three times a week    Frequency of Social Gatherings with Friends and Family: More than three times a week    Attends Religious Services: More than 4 times per year    Active Member of [employer] or Organizations: Yes    Attends Engineer, structural: More than 4 times per year    Marital Status: Never married   Additional Social History:                         Sleep: Good  Appetite:  Fair  Current Medications: Current Outpatient Medications  Medication Sig Dispense Refill   acetaminophen (TYLENOL) 500 MG tablet Take 2 tablets (1,000 mg total) by mouth every 8 (eight) hours as needed for mild pain or moderate pain. 60 tablet 0   ASHWAGANDHA PO Take 1 capsule by mouth 2 (two) times daily.     aspirin EC 81 MG tablet Take 1 tablet (81 mg total) by mouth 2 (two) times daily. To prevent blood clots for 30 days after surgery. 60 tablet 0   azelastine (ASTELIN) 0.1 % nasal spray Place 1 spray into both nostrils 2 (two) times daily.     baclofen (LIORESAL) 10 MG tablet Take 1 tablet (10 mg total) by mouth every 8 (eight) hours as needed for muscle spasms. 21 tablet 0   cetirizine (ZYRTEC ALLERGY) 10 MG tablet Take 1 tablet (10 mg total) by mouth daily. 90 tablet 0   cholecalciferol (VITAMIN D) 1000 UNITS tablet Take 1,000 Units by mouth daily.     clonazePAM (KLONOPIN) 1 MG tablet Take 1 tablet qam & 2 tablets qhs 90 tablet 3   DEXILANT 30 MG capsule Take 1 capsule by mouth daily.     diclofenac Sodium (VOLTAREN) 1 % GEL Apply topically 4 (four) times daily.     diphenhydrAMINE (BENADRYL) 25 MG tablet 1 tablet as needed     Echinacea-Goldenseal LIQD Place 1 drop into the nose daily as needed.     Ginger, Zingiber officinalis, (GINGER EXTRACT PO) Take by mouth.     hydrocortisone (ANUSOL-HC) 2.5 % rectal cream PLACE 1 APPLICATION IN RECTUM 3 TIMES A DAY  lidocaine (LIDODERM) 5 % Place 1 patch onto the skin daily. Remove & Discard patch within 12 hours or as directed by MD 30  patch 0   Magnesium 500 MG TABS 1 tablet     meloxicam (MOBIC) 15 MG tablet Take 1 tablet (15 mg total) by mouth daily as needed for pain (and inflammation). 30 tablet 0   mirtazapine (REMERON) 30 MG tablet Take 1 tablet (30 mg total) by mouth at bedtime. 30 tablet 5   NON FORMULARY 2 (two) times daily. [place]     nortriptyline (PAMELOR) 50 MG capsule 2 qhs 60 capsule 4   Omega-3 Fatty Acids (FISH OIL PO) Take by mouth 2 (two) times a day.     ondansetron (ZOFRAN) 8 MG tablet Take 1 tablet (8 mg total) by mouth every 8 (eight) hours as needed for nausea or vomiting. 20 tablet 3   oxyCODONE-acetaminophen (PERCOCET) 10-325 MG tablet Take 1 tablet by mouth 2 (two) times daily.     potassium chloride SA (K-DUR,KLOR-CON) 20 MEQ tablet Take 20 mEq by mouth 2 (two) times daily.     promethazine-dextromethorphan (PROMETHAZINE-DM) 6.25-15 MG/5ML syrup 5 ml as needed     rosuvastatin (CRESTOR) 10 MG tablet 1 tablet     temazepam (RESTORIL) 15 MG capsule Take 1 capsule (15 mg total) by mouth at bedtime as needed for sleep. 30 capsule 3   topiramate (TOPAMAX) 100 MG tablet Take 1 tablet (100 mg total) by mouth 2 (two) times daily. 180 tablet 3   triamterene-hydrochlorothiazide (DYAZIDE) 50-25 MG capsule Take 1 capsule by mouth daily.     TURMERIC PO Take by mouth as needed.     Ubrogepant (UBRELVY) 50 MG TABS Take one tablet onset migraine, may repeat in 2 hours if needed (max 2 tabs/24 hours) 10 tablet 11   valACYclovir (VALTREX) 1000 MG tablet Take 1,000 mg by mouth daily.     vitamin B-12 (CYANOCOBALAMIN) 1000 MCG tablet Take 1,000 mcg by mouth daily.     No current facility-administered medications for this visit.    Lab Results: No results found for this or any previous visit (from the past 48 hour(s)).  Physical Findings: AIMS:  , ,  ,  ,    CIWA:    COWS:     Musculoskeletal: Strength & Muscle Tone: within normal limits Gait & Station: normal Patient leans: N/A  Psychiatric Specialty  Exam: ROS  There were no vitals taken for this visit.There is no height or weight on file to calculate BMI.  General Appearance: Casual  Eye Contact::  Good  Speech:  Clear and Coherent  Volume:  Normal  Mood:  Euthymic  Affect:  Congruent  Thought Process:  Coherent  Orientation:  Full (Time, Place, and Person)  Thought Content:  WDL  Suicidal Thoughts:  No  Homicidal Thoughts:  No  Memory:  NA  Judgement:  Good  Insight:  Fair  Psychomotor Activity:  Normal  Concentration:  Fair  Recall:  Good  Fund of Knowledge:Good  Language: Good  Akathisia:  No  Handed:  Right  AIMS (if indicated):     Assets:   ADL's:  Intact  Cognition: WNL  Sleep:       Treatment Plan  01/27/2023, 2:44 PM   This patient's first problem is major depression which is in remission.  She continues taking Remeron 30 mg and nortriptyline 50 mg.  Her second problem is insomnia she continues taking Restoril for this condition.  She takes 83  mg.  She has an adjustment disorder with an anxious mood state which is her third problem she takes Klonopin 1 mg 1 in the morning and 2 at night.  This patient she will return to see me in 3 months she is functioning very well.

## 2023-02-11 DIAGNOSIS — J301 Allergic rhinitis due to pollen: Secondary | ICD-10-CM | POA: Diagnosis not present

## 2023-02-11 DIAGNOSIS — J4520 Mild intermittent asthma, uncomplicated: Secondary | ICD-10-CM | POA: Diagnosis not present

## 2023-02-11 DIAGNOSIS — J300 Vasomotor rhinitis: Secondary | ICD-10-CM | POA: Diagnosis not present

## 2023-02-11 DIAGNOSIS — H1045 Other chronic allergic conjunctivitis: Secondary | ICD-10-CM | POA: Diagnosis not present

## 2023-02-18 DIAGNOSIS — M8580 Other specified disorders of bone density and structure, unspecified site: Secondary | ICD-10-CM | POA: Diagnosis not present

## 2023-02-18 DIAGNOSIS — F17210 Nicotine dependence, cigarettes, uncomplicated: Secondary | ICD-10-CM | POA: Diagnosis not present

## 2023-02-18 DIAGNOSIS — Z1231 Encounter for screening mammogram for malignant neoplasm of breast: Secondary | ICD-10-CM | POA: Diagnosis not present

## 2023-02-18 DIAGNOSIS — I10 Essential (primary) hypertension: Secondary | ICD-10-CM | POA: Diagnosis not present

## 2023-02-18 DIAGNOSIS — Z5320 Procedure and treatment not carried out because of patient's decision for unspecified reasons: Secondary | ICD-10-CM | POA: Diagnosis not present

## 2023-02-18 DIAGNOSIS — Z79899 Other long term (current) drug therapy: Secondary | ICD-10-CM | POA: Diagnosis not present

## 2023-02-18 DIAGNOSIS — N183 Chronic kidney disease, stage 3 unspecified: Secondary | ICD-10-CM | POA: Diagnosis not present

## 2023-02-18 DIAGNOSIS — M51369 Other intervertebral disc degeneration, lumbar region without mention of lumbar back pain or lower extremity pain: Secondary | ICD-10-CM | POA: Diagnosis not present

## 2023-02-18 DIAGNOSIS — E7800 Pure hypercholesterolemia, unspecified: Secondary | ICD-10-CM | POA: Diagnosis not present

## 2023-02-18 DIAGNOSIS — R7303 Prediabetes: Secondary | ICD-10-CM | POA: Diagnosis not present

## 2023-02-22 DIAGNOSIS — Z79899 Other long term (current) drug therapy: Secondary | ICD-10-CM | POA: Diagnosis not present

## 2023-03-12 DIAGNOSIS — S46012D Strain of muscle(s) and tendon(s) of the rotator cuff of left shoulder, subsequent encounter: Secondary | ICD-10-CM | POA: Diagnosis not present

## 2023-03-15 DIAGNOSIS — I10 Essential (primary) hypertension: Secondary | ICD-10-CM | POA: Diagnosis not present

## 2023-03-15 DIAGNOSIS — N183 Chronic kidney disease, stage 3 unspecified: Secondary | ICD-10-CM | POA: Diagnosis not present

## 2023-03-15 DIAGNOSIS — R7303 Prediabetes: Secondary | ICD-10-CM | POA: Diagnosis not present

## 2023-03-15 DIAGNOSIS — Z1231 Encounter for screening mammogram for malignant neoplasm of breast: Secondary | ICD-10-CM | POA: Diagnosis not present

## 2023-03-15 DIAGNOSIS — Z79899 Other long term (current) drug therapy: Secondary | ICD-10-CM | POA: Diagnosis not present

## 2023-03-15 DIAGNOSIS — M8580 Other specified disorders of bone density and structure, unspecified site: Secondary | ICD-10-CM | POA: Diagnosis not present

## 2023-03-15 DIAGNOSIS — M51369 Other intervertebral disc degeneration, lumbar region without mention of lumbar back pain or lower extremity pain: Secondary | ICD-10-CM | POA: Diagnosis not present

## 2023-03-15 DIAGNOSIS — E559 Vitamin D deficiency, unspecified: Secondary | ICD-10-CM | POA: Diagnosis not present

## 2023-03-15 DIAGNOSIS — E7800 Pure hypercholesterolemia, unspecified: Secondary | ICD-10-CM | POA: Diagnosis not present

## 2023-03-17 DIAGNOSIS — Z79899 Other long term (current) drug therapy: Secondary | ICD-10-CM | POA: Diagnosis not present

## 2023-03-24 DIAGNOSIS — M8589 Other specified disorders of bone density and structure, multiple sites: Secondary | ICD-10-CM | POA: Diagnosis not present

## 2023-03-24 DIAGNOSIS — Z9189 Other specified personal risk factors, not elsewhere classified: Secondary | ICD-10-CM | POA: Diagnosis not present

## 2023-04-02 DIAGNOSIS — G894 Chronic pain syndrome: Secondary | ICD-10-CM | POA: Diagnosis not present

## 2023-04-02 DIAGNOSIS — G43719 Chronic migraine without aura, intractable, without status migrainosus: Secondary | ICD-10-CM | POA: Diagnosis not present

## 2023-04-02 DIAGNOSIS — G720 Drug-induced myopathy: Secondary | ICD-10-CM | POA: Diagnosis not present

## 2023-04-02 DIAGNOSIS — J449 Chronic obstructive pulmonary disease, unspecified: Secondary | ICD-10-CM | POA: Diagnosis not present

## 2023-04-02 DIAGNOSIS — I272 Pulmonary hypertension, unspecified: Secondary | ICD-10-CM | POA: Diagnosis not present

## 2023-04-02 DIAGNOSIS — E1142 Type 2 diabetes mellitus with diabetic polyneuropathy: Secondary | ICD-10-CM | POA: Diagnosis not present

## 2023-04-02 DIAGNOSIS — I10 Essential (primary) hypertension: Secondary | ICD-10-CM | POA: Diagnosis not present

## 2023-04-02 DIAGNOSIS — K219 Gastro-esophageal reflux disease without esophagitis: Secondary | ICD-10-CM | POA: Diagnosis not present

## 2023-04-02 DIAGNOSIS — E785 Hyperlipidemia, unspecified: Secondary | ICD-10-CM | POA: Diagnosis not present

## 2023-04-02 DIAGNOSIS — J4530 Mild persistent asthma, uncomplicated: Secondary | ICD-10-CM | POA: Diagnosis not present

## 2023-04-08 ENCOUNTER — Other Ambulatory Visit (HOSPITAL_COMMUNITY): Payer: Self-pay | Admitting: Psychiatry

## 2023-04-15 DIAGNOSIS — F17210 Nicotine dependence, cigarettes, uncomplicated: Secondary | ICD-10-CM | POA: Diagnosis not present

## 2023-04-15 DIAGNOSIS — R0602 Shortness of breath: Secondary | ICD-10-CM | POA: Diagnosis not present

## 2023-04-15 DIAGNOSIS — M8580 Other specified disorders of bone density and structure, unspecified site: Secondary | ICD-10-CM | POA: Diagnosis not present

## 2023-04-15 DIAGNOSIS — R5383 Other fatigue: Secondary | ICD-10-CM | POA: Diagnosis not present

## 2023-04-15 DIAGNOSIS — M129 Arthropathy, unspecified: Secondary | ICD-10-CM | POA: Diagnosis not present

## 2023-04-15 DIAGNOSIS — N183 Chronic kidney disease, stage 3 unspecified: Secondary | ICD-10-CM | POA: Diagnosis not present

## 2023-04-15 DIAGNOSIS — I10 Essential (primary) hypertension: Secondary | ICD-10-CM | POA: Diagnosis not present

## 2023-04-15 DIAGNOSIS — M51369 Other intervertebral disc degeneration, lumbar region without mention of lumbar back pain or lower extremity pain: Secondary | ICD-10-CM | POA: Diagnosis not present

## 2023-04-15 DIAGNOSIS — R7303 Prediabetes: Secondary | ICD-10-CM | POA: Diagnosis not present

## 2023-04-15 DIAGNOSIS — E7800 Pure hypercholesterolemia, unspecified: Secondary | ICD-10-CM | POA: Diagnosis not present

## 2023-04-15 DIAGNOSIS — Z79899 Other long term (current) drug therapy: Secondary | ICD-10-CM | POA: Diagnosis not present

## 2023-04-15 DIAGNOSIS — E559 Vitamin D deficiency, unspecified: Secondary | ICD-10-CM | POA: Diagnosis not present

## 2023-04-18 DIAGNOSIS — Z79899 Other long term (current) drug therapy: Secondary | ICD-10-CM | POA: Diagnosis not present

## 2023-04-28 ENCOUNTER — Other Ambulatory Visit: Payer: Self-pay

## 2023-04-28 ENCOUNTER — Ambulatory Visit (HOSPITAL_BASED_OUTPATIENT_CLINIC_OR_DEPARTMENT_OTHER): Payer: 59 | Admitting: Psychiatry

## 2023-04-28 VITALS — BP 147/104 | HR 80 | Ht 64.0 in | Wt 243.0 lb

## 2023-04-28 DIAGNOSIS — F325 Major depressive disorder, single episode, in full remission: Secondary | ICD-10-CM

## 2023-04-28 MED ORDER — NORTRIPTYLINE HCL 50 MG PO CAPS: ORAL_CAPSULE | ORAL | 4 refills | Status: DC

## 2023-04-28 MED ORDER — CLONAZEPAM 1 MG PO TABS: ORAL_TABLET | ORAL | 3 refills | Status: DC

## 2023-04-28 MED ORDER — MIRTAZAPINE 30 MG PO TABS: 30.0000 mg | ORAL_TABLET | Freq: Every day | ORAL | 5 refills | Status: DC

## 2023-04-28 MED ORDER — TEMAZEPAM 15 MG PO CAPS: 15.0000 mg | ORAL_CAPSULE | Freq: Every evening | ORAL | 3 refills | Status: DC | PRN

## 2023-04-28 NOTE — Progress Notes (Signed)
Patient ID: Gloria Lewis, female   DOB: 1956-01-14, 68 y.o.   MRN: [id number] [facility] MD Progress Note  04/28/2023 3:02 PM Gloria Lewis  MRN:  [id number] Subjective:  Shoulder hurting Principal Problem: Major Depression,recurent Mild Diagnosis: Adjustment disorder with an anxious mood stat     Today patient is doing great.  She had her left shoulder operated on and it is much better.  Her other shoulder is bothering her a little bit but not much.  It turns out that her son Gerri Spore did not move out.  But according to her he is doing great.  He helps around the house a lot.  He does not use drugs he is not being aggressive and he is taking his medicines.  He is perfectly pleased with how well he is doing.  It is noted that he is off the lease so she has to pay less money.  The patient's biggest problem seems to be poorly controlled hypertension.  Her diastolic blood pressure seems to run over 100 almost all the time.  She does not smoke cigarettes and uses no alcohol and no drugs.  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  The patient is very spiritual.  She goes to church she reads the Bible.  She is very dedicated to doing volunteer work.  She loves to cook.  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  The patient still is only 1 or 2 classes away from getting a psychology degree from [employer] which I think she will eventually do.  The patient's weight is consistent around 230 pounds.  This is unchanged. Patient Active Problem List   Diagnosis Date Noted   Partial nontraumatic tear of left rotator cuff [M75.112] 12/01/2022   Infectious gastroenteritis [A09] 04/16/2016   Xerostomia [K11.7] 03/09/2016   Surgery, elective [Z41.9] 05/23/2015   S/P arthroscopy of shoulder [Z98.890] 05/23/2015   Chronic migraine without aura without status migrainosus, not intractable [G43.709] 10/18/2014   Tobacco abuse [Z72.0] 10/18/2014   Obesity  [E66.9] 09/23/2014   COPD [J44.9] 09/02/2014   Pulmonary hypertension (HCC) [I27.20] 08/31/2014   Respiratory failure with hypoxia (HCC) [J96.91] 08/14/2014   Cigarette smoker [F17.210] 07/28/2014   Major depressive disorder, recurrent episode, moderate (HCC) [F33.1] 07/06/2014   Essential hypertension [I10]    SOB (shortness of breath) [R06.02] 06/21/2014   Precordial pain [R07.2] 06/21/2014   Gastroesophageal reflux disease [K21.9] 06/21/2014   Chest pain [R07.9] 06/21/2014   HTN (hypertension) [I10]    Neck pain [M54.2] 01/08/2014   Severe episode of recurrent major depressive disorder (HCC) [F33.2] 10/14/2012   Schizoaffective disorder (HCC) [F25.9] 05/26/2012   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] 10/06/2010   Hyperparathyroidism, primary (HCC) [E21.0] 10/06/2010   DEGENERATIVE DISC DISEASE, LUMBOSACRAL SPINE [M51.379] 05/21/2007   DERMATOPHYTOSIS OF THE BODY [B35.4] 05/10/2007   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] 03/24/2007   Anxiety state [F41.1] 03/24/2007   DENTAL PAIN [K08.9] 03/24/2007   SHOULDER PAIN, LEFT [M25.519] 03/24/2007   Total Time spent with patient:30 min  Past Psychiatric History:   Past Medical History:  Past Medical History:  Diagnosis Date   Anginal pain (HCC)    admit 06/2014; had non-ischemic stress test   Anxiety    Bipolar 1 disorder (HCC)    Colon polyp    Complication of anesthesia    pt reports hx of waking up during anesthesia   CTS (carpal tunnel syndrome)  Depression    Diabetes mellitus without complication (HCC)    Fever blister    GERD (gastroesophageal reflux disease)    HA (headache)    HTN (hypertension)    Hypercholesterolemia    Migraines    OA (osteoarthritis)    Schizo-affective psychosis (HCC)     Past Surgical History:  Procedure Laterality Date   ANTERIOR CERVICAL DECOMP/DISCECTOMY FUSION  08/27/2011   Procedure: ANTERIOR CERVICAL DECOMPRESSION/DISCECTOMY FUSION 1 LEVEL/HARDWARE REMOVAL;  Surgeon: Tia Alert, MD;   Location: [facility] NEURO ORS;  Service: Neurosurgery;  Laterality: Bilateral;  Cervical four-five Anterior cervical decompression/diskectomy, fusion, Plate, Removal of Cervical five-seven Plate   back injection     CARDIAC CATHETERIZATION N/A 11/09/2014   Procedure: Right Heart Cath;  Surgeon: Laurey Morale, MD;  Location[facility] INVASIVE CV LAB;  Service: Cardiovascular;  Laterality: N/A;   CARPAL TUNNEL RELEASE  20110 rt/lt   rt x2 , lt x1   COLONOSCOPY  06/2017   [facility]   ESOPHAGOGASTRODUODENOSCOPY  06/2017   [facility]   HEMORRHOID SURGERY     MULTIPLE TOOTH EXTRACTIONS     NECK SURGERY  2009   PITUITARY SURGERY     Had gland removed from producing too much calcium   polp removed  2011   RIGHT/LEFT HEART CATH AND CORONARY ANGIOGRAPHY N/A 07/05/2017   Procedure: RIGHT/LEFT HEART CATH AND CORONARY ANGIOGRAPHY;  Surgeon: Laurey Morale, MD;  Location: [facility] INVASIVE CV LAB;  Service: Cardiovascular;  Laterality: N/A;   SHOULDER ARTHROSCOPY Left 12/01/2022   Procedure: ARTHROSCOPY SHOULDER, BICEPS TENODESIS, ROTATOR CUFF REPAIR;  Surgeon: Sheral Apley, MD;  Location: [facility];  Service: Orthopedics;  Laterality: Left;   SHOULDER ARTHROSCOPY WITH ROTATOR CUFF REPAIR Right 05/23/2015   Procedure: RIGHT SHOULDER ARTHROSCOPY WITH REMOVAL OF SUTURE ANCHOR AND POSSIBLE REVISION ROTATOR CUFF REPAIR;  Surgeon: Francena Hanly, MD;  Location: [facility] OR;  Service: Orthopedics;  Laterality: Right;   SHOULDER ARTHROSCOPY WITH SUBACROMIAL DECOMPRESSION Right 01/24/2015   Procedure: RIGHT SHOULDER ARTHROSCOPY WITH SUBACROMIAL DECOMPRESSION AD DISTAL CLAVICLE RESECTION ;  Surgeon: Francena Hanly, MD;  Location: [facility] OR;  Service: Orthopedics;  Laterality: Right;   VAGINAL DELIVERY     x3   Family History:  Family History  Problem Relation Age of Onset   Coronary artery disease Father    Cancer Father        head neck    Esophageal cancer Father    Hypertension Mother     Schizophrenia Mother    Diabetes Mother    Heart attack Mother    Depression Brother    Suicidality Brother    Prostate cancer Brother    Cancer Brother        bone marrow   Schizophrenia Maternal Grandmother    Anesthesia problems Neg Hx    Hypotension Neg Hx    Malignant hyperthermia Neg Hx    Pseudochol deficiency Neg Hx    Allergic rhinitis Neg Hx    Angioedema Neg Hx    Asthma Neg Hx    Atopy Neg Hx    Eczema Neg Hx    Immunodeficiency Neg Hx    Urticaria Neg Hx    Breast cancer Neg Hx    Family Psychiatric  History:  Social History:  Social History   Substance and Sexual Activity  Alcohol Use No   Alcohol/week: 0.0 standard drinks of alcohol     Social History   Substance and Sexual Activity  Drug  Use No   Comment: hx crack addiction 2008    Social History   Socioeconomic History   Marital status: Single    Spouse name: Not on file   Number of children: 3   Years of education: 12   Highest education level: Some college, no degree  Occupational History   Occupation: disabled/retired  Tobacco Use   Smoking status: Some Days    Types: Cigars   Smokeless tobacco: Never  Vaping Use   Vaping status: Some Days  Substance and Sexual Activity   Alcohol use: No    Alcohol/week: 0.0 standard drinks of alcohol   Drug use: No    Comment: hx crack addiction 2008   Sexual activity: Never  Other Topics Concern   Not on file  Social History Narrative   Lives in a two story home alone      [place] in [place] for pain management      Right handed   12th grade      Caffeine coffee 1 cup /day   Social Drivers of Health   Financial Resource Strain: Medium Risk (03/12/2017)   Overall Financial Resource Strain (CARDIA)    Difficulty of Paying Living Expenses: Somewhat hard  Food Insecurity: Food Insecurity Present (03/12/2017)   Hunger Vital Sign    Worried About Running Out of Food in the Last Year: Sometimes true    Ran Out of Food in the Last Year:  Sometimes true  Transportation Needs: Unmet Transportation Needs (03/12/2017)   PRAPARE - Transportation    Lack of Transportation (Medical): Yes    Lack of Transportation (Non-Medical): Yes  Physical Activity: Inactive (03/12/2017)   Exercise Vital Sign    Days of Exercise per Week: 0 days    Minutes of Exercise per Session: 0 min  Stress: Stress Concern Present (03/12/2017)   [employer] of Occupational Health - Occupational Stress Questionnaire    Feeling of Stress : Rather much  Social Connections: Moderately Integrated (03/12/2017)   Social Connection and Isolation Panel [NHANES]    Frequency of Communication with Friends and Family: More than three times a week    Frequency of Social Gatherings with Friends and Family: More than three times a week    Attends Religious Services: More than 4 times per year    Active Member of [employer] or Organizations: Yes    Attends Engineer, structural: More than 4 times per year    Marital Status: Never married   Additional Social History:                         Sleep: Good  Appetite:  Fair  Current Medications: Current Outpatient Medications  Medication Sig Dispense Refill   acetaminophen (TYLENOL) 500 MG tablet Take 2 tablets (1,000 mg total) by mouth every 8 (eight) hours as needed for mild pain or moderate pain. 60 tablet 0   ASHWAGANDHA PO Take 1 capsule by mouth 2 (two) times daily.     aspirin EC 81 MG tablet Take 1 tablet (81 mg total) by mouth 2 (two) times daily. To prevent blood clots for 30 days after surgery. 60 tablet 0   azelastine (ASTELIN) 0.1 % nasal spray Place 1 spray into both nostrils 2 (two) times daily.     baclofen (LIORESAL) 10 MG tablet Take 1 tablet (10 mg total) by mouth every 8 (eight) hours as needed for muscle spasms. 21 tablet 0   cetirizine (ZYRTEC ALLERGY) 10  MG tablet Take 1 tablet (10 mg total) by mouth daily. 90 tablet 0   cholecalciferol (VITAMIN D) 1000 UNITS tablet Take 1,000  Units by mouth daily.     clonazePAM (KLONOPIN) 1 MG tablet Take 1 tablet qam & 2 tablets qhs 90 tablet 3   DEXILANT 30 MG capsule Take 1 capsule by mouth daily.     diclofenac Sodium (VOLTAREN) 1 % GEL Apply topically 4 (four) times daily.     diphenhydrAMINE (BENADRYL) 25 MG tablet 1 tablet as needed     Echinacea-Goldenseal LIQD Place 1 drop into the nose daily as needed.     Ginger, Zingiber officinalis, (GINGER EXTRACT PO) Take by mouth.     hydrocortisone (ANUSOL-HC) 2.5 % rectal cream PLACE 1 APPLICATION IN RECTUM 3 TIMES A DAY     lidocaine (LIDODERM) 5 % Place 1 patch onto the skin daily. Remove & Discard patch within 12 hours or as directed by MD 30 patch 0   Magnesium 500 MG TABS 1 tablet     meloxicam (MOBIC) 15 MG tablet Take 1 tablet (15 mg total) by mouth daily as needed for pain (and inflammation). 30 tablet 0   mirtazapine (REMERON) 30 MG tablet Take 1 tablet (30 mg total) by mouth at bedtime. 30 tablet 5   NON FORMULARY 2 (two) times daily. [place]     nortriptyline (PAMELOR) 50 MG capsule 2 qhs 60 capsule 4   Omega-3 Fatty Acids (FISH OIL PO) Take by mouth 2 (two) times a day.     ondansetron (ZOFRAN) 8 MG tablet Take 1 tablet (8 mg total) by mouth every 8 (eight) hours as needed for nausea or vomiting. 20 tablet 3   oxyCODONE-acetaminophen (PERCOCET) 10-325 MG tablet Take 1 tablet by mouth 2 (two) times daily.     potassium chloride SA (K-DUR,KLOR-CON) 20 MEQ tablet Take 20 mEq by mouth 2 (two) times daily.     promethazine-dextromethorphan (PROMETHAZINE-DM) 6.25-15 MG/5ML syrup 5 ml as needed     rosuvastatin (CRESTOR) 10 MG tablet 1 tablet     temazepam (RESTORIL) 15 MG capsule Take 1 capsule (15 mg total) by mouth at bedtime as needed for sleep. 30 capsule 3   topiramate (TOPAMAX) 100 MG tablet Take 1 tablet (100 mg total) by mouth 2 (two) times daily. 180 tablet 3   triamterene-hydrochlorothiazide (DYAZIDE) 50-25 MG capsule Take 1 capsule by mouth daily.     TURMERIC PO  Take by mouth as needed.     Ubrogepant (UBRELVY) 50 MG TABS Take one tablet onset migraine, may repeat in 2 hours if needed (max 2 tabs/24 hours) 10 tablet 11   valACYclovir (VALTREX) 1000 MG tablet Take 1,000 mg by mouth daily.     vitamin B-12 (CYANOCOBALAMIN) 1000 MCG tablet Take 1,000 mcg by mouth daily.     No current facility-administered medications for this visit.    Lab Results: No results found for this or any previous visit (from the past 48 hours).  Physical Findings: AIMS:  , ,  ,  ,    CIWA:    COWS:     Musculoskeletal: Strength & Muscle Tone: within normal limits Gait & Station: normal Patient leans: N/A  Psychiatric Specialty Exam: ROS  Blood pressure (!) 147/104, pulse 80, height 5\' 4"  (1.626 m), weight 243 lb (110.2 kg).Body mass index is 41.71 kg/m.  General Appearance: Casual  Eye Contact::  Good  Speech:  Clear and Coherent  Volume:  Normal  Mood:  Euthymic  Affect:  Congruent  Thought Process:  Coherent  Orientation:  Full (Time, Place, and Person)  Thought Content:  WDL  Suicidal Thoughts:  No  Homicidal Thoughts:  No  Memory:  NA  Judgement:  Good  Insight:  Fair  Psychomotor Activity:  Normal  Concentration:  Fair  Recall:  Good  Fund of Knowledge:Good  Language: Good  Akathisia:  No  Handed:  Right  AIMS (if indicated):     Assets:   ADL's:  Intact  Cognition: WNL  Sleep:       Treatment Plan  04/28/2023, 3:02 PM   This patient's diagnosis is major depression in remission.  She takes Remeron 30 mg and nortriptyline 50 mg.  Her other problems and adjustment disorder with an anxious mood state.  This is very stable.  She takes Klonopin 1 mg 1 in the morning and 2 at night.  She has had no falls.  She does not feel over sedated at all.  She is very reliable and consistent about her medicines.  Her third problem is insomnia.  She takes 15 mg of Restoril and sleeps well.  She is sleeping and eating well has no psychosis and does not abuse  any drugs and is functioning very very well.  She will return to see me in 3 months.

## 2023-04-29 DIAGNOSIS — M8588 Other specified disorders of bone density and structure, other site: Secondary | ICD-10-CM | POA: Diagnosis not present

## 2023-04-29 DIAGNOSIS — M8589 Other specified disorders of bone density and structure, multiple sites: Secondary | ICD-10-CM | POA: Diagnosis not present

## 2023-05-05 DIAGNOSIS — I10 Essential (primary) hypertension: Secondary | ICD-10-CM | POA: Diagnosis not present

## 2023-05-05 DIAGNOSIS — E785 Hyperlipidemia, unspecified: Secondary | ICD-10-CM | POA: Diagnosis not present

## 2023-05-11 DIAGNOSIS — H0288B Meibomian gland dysfunction left eye, upper and lower eyelids: Secondary | ICD-10-CM | POA: Diagnosis not present

## 2023-05-11 DIAGNOSIS — H0288A Meibomian gland dysfunction right eye, upper and lower eyelids: Secondary | ICD-10-CM | POA: Diagnosis not present

## 2023-05-11 DIAGNOSIS — H00021 Hordeolum internum right upper eyelid: Secondary | ICD-10-CM | POA: Diagnosis not present

## 2023-05-17 DIAGNOSIS — Z79899 Other long term (current) drug therapy: Secondary | ICD-10-CM | POA: Diagnosis not present

## 2023-05-17 DIAGNOSIS — N183 Chronic kidney disease, stage 3 unspecified: Secondary | ICD-10-CM | POA: Diagnosis not present

## 2023-05-17 DIAGNOSIS — I10 Essential (primary) hypertension: Secondary | ICD-10-CM | POA: Diagnosis not present

## 2023-05-17 DIAGNOSIS — R7303 Prediabetes: Secondary | ICD-10-CM | POA: Diagnosis not present

## 2023-05-17 DIAGNOSIS — G43909 Migraine, unspecified, not intractable, without status migrainosus: Secondary | ICD-10-CM | POA: Diagnosis not present

## 2023-05-17 DIAGNOSIS — M129 Arthropathy, unspecified: Secondary | ICD-10-CM | POA: Diagnosis not present

## 2023-05-17 DIAGNOSIS — R0602 Shortness of breath: Secondary | ICD-10-CM | POA: Diagnosis not present

## 2023-05-17 DIAGNOSIS — E7800 Pure hypercholesterolemia, unspecified: Secondary | ICD-10-CM | POA: Diagnosis not present

## 2023-05-17 DIAGNOSIS — E559 Vitamin D deficiency, unspecified: Secondary | ICD-10-CM | POA: Diagnosis not present

## 2023-05-17 DIAGNOSIS — M51369 Other intervertebral disc degeneration, lumbar region without mention of lumbar back pain or lower extremity pain: Secondary | ICD-10-CM | POA: Diagnosis not present

## 2023-05-17 DIAGNOSIS — R5383 Other fatigue: Secondary | ICD-10-CM | POA: Diagnosis not present

## 2023-05-17 DIAGNOSIS — F17210 Nicotine dependence, cigarettes, uncomplicated: Secondary | ICD-10-CM | POA: Diagnosis not present

## 2023-05-20 DIAGNOSIS — Z79899 Other long term (current) drug therapy: Secondary | ICD-10-CM | POA: Diagnosis not present

## 2023-05-25 DIAGNOSIS — R519 Headache, unspecified: Secondary | ICD-10-CM | POA: Diagnosis not present

## 2023-05-26 ENCOUNTER — Telehealth: Payer: Self-pay

## 2023-05-26 NOTE — Telephone Encounter (Signed)
*  [facility]  Pharmacy Patient Advocate Encounter   Received notification from CoverMyMeds that prior authorization for Ubrelvy 50MG  tablets  is required/requested.   Insurance verification completed.   The patient is insured through [facility] .   Per test claim: PA required; However, NEW/RECENT labs/notes are needed to complete & submit PA request. Please see below.  Patient has not been seen since 06/2021-Script for Bernita Raisin has expired as of 05/2022   CMM Key: [id number]

## 2023-05-26 NOTE — Telephone Encounter (Signed)
Please disregard, pt has not been seen since 2023, has no upcoming appt/no showed.

## 2023-06-02 NOTE — Telephone Encounter (Signed)
 Cancelling request. Nothing further needed.

## 2023-06-10 DIAGNOSIS — R9431 Abnormal electrocardiogram [ECG] [EKG]: Secondary | ICD-10-CM | POA: Diagnosis not present

## 2023-06-10 DIAGNOSIS — R002 Palpitations: Secondary | ICD-10-CM | POA: Diagnosis not present

## 2023-06-14 DIAGNOSIS — Z87891 Personal history of nicotine dependence: Secondary | ICD-10-CM | POA: Diagnosis not present

## 2023-06-14 DIAGNOSIS — N183 Chronic kidney disease, stage 3 unspecified: Secondary | ICD-10-CM | POA: Diagnosis not present

## 2023-06-14 DIAGNOSIS — E559 Vitamin D deficiency, unspecified: Secondary | ICD-10-CM | POA: Diagnosis not present

## 2023-06-14 DIAGNOSIS — E7800 Pure hypercholesterolemia, unspecified: Secondary | ICD-10-CM | POA: Diagnosis not present

## 2023-06-14 DIAGNOSIS — M51369 Other intervertebral disc degeneration, lumbar region without mention of lumbar back pain or lower extremity pain: Secondary | ICD-10-CM | POA: Diagnosis not present

## 2023-06-14 DIAGNOSIS — F17210 Nicotine dependence, cigarettes, uncomplicated: Secondary | ICD-10-CM | POA: Diagnosis not present

## 2023-06-14 DIAGNOSIS — E119 Type 2 diabetes mellitus without complications: Secondary | ICD-10-CM | POA: Diagnosis not present

## 2023-06-14 DIAGNOSIS — Z1231 Encounter for screening mammogram for malignant neoplasm of breast: Secondary | ICD-10-CM | POA: Diagnosis not present

## 2023-06-14 DIAGNOSIS — I10 Essential (primary) hypertension: Secondary | ICD-10-CM | POA: Diagnosis not present

## 2023-06-14 DIAGNOSIS — Z79899 Other long term (current) drug therapy: Secondary | ICD-10-CM | POA: Diagnosis not present

## 2023-06-14 NOTE — Progress Notes (Signed)
   ADVANCED HEART FAILURE NEW PATIENT CLINIC NOTE  Referring Physician: Laurann Montana, MD  Primary Care: Laurann Montana, MD Primary Cardiologist:  HPI: Gloria Lewis is a 68 y.o. female with a PMH of hypertension, pulmonary hypertension, tobacco abuse who presents for initial visit for further evaluation and treatment of heart failure/cardiomyopathy.      She was admitted in 3/16 with chest pain that was somewhat positional.  Lexiscan Cardiolite showed no ischemia or infarction.  She had a V/Q scan that actually suggested high risk for PE, but subsequent CTA chest did not show a PE.  There was RML and lingular scarring that may have created the appearance of PE on the V/Q scan.  Echo done later in 5/16 showed preserved LV systolic function but mildly dilated RV with PA systolic pressure 42 mmHg.  She had minimal obstruction or restriction on her PFTs, but DLCO was moderately reduced.    She had right and left heart cath in 4/19. This showed normal right and left heart filling pressures with mild pulmonary hypertension.  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      SUBJECTIVE: Patient reports that overall she is doing well. She has had palptiations and a racing heart beat since she had shoulder surgery in August. She denies any syncopal episodes, leg swelling, melena, bleeding, or chest pain. She is concerned due to mlutiple family members with medical problems. BP controlled today.   PMH, current medications, allergies, social history, and family history reviewed in epic.  PHYSICAL EXAM: Vitals:   06/15/23 0850  BP: 120/88  Pulse: (!) 120  SpO2: 95%   GENERAL: Well nourished and in no apparent distress at rest.  PULM:  Normal work of breathing, clear to auscultation bilaterally. Respirations are unlabored.  CARDIAC:  JVP: not elevated         tachycardic rate with regular rhythm. No murmurs, rubs or gallops.  No edema. Warm and well perfused extremities. ABDOMEN: Soft, non-tender,  non-distended. NEUROLOGIC: Patient is oriented x3 with no focal or lateralizing neurologic deficits.    DATA REVIEW  ECG: 06/15/23: Sinus tachycardia    ECHO: 06/2017: LVEF 60-65%, normal RV function  CATH: 11/2014: RA 9, PA 35/13 (22), PCWP 7, CO/CI 6.9/3.27, PVR 2.2 07/2017:  Mild LAD stenosis, RA 7, PA 42/13, mean 25, PCWP 9, CO/CI 6/2.75, PVR 2.7 wood units    ASSESSMENT & PLAN:  Pulmonary hypertension: Mild, noted on previous right heart catheterization.  Presumed primarily group 3.  Previously negative pulmonary embolism workup. -Repeat echocardiogram -Consider repeat sleep study  Sinus tachycardia: Noted on EKG, reports palpitations since her surgery.  Does not appear anemic and no report of blood loss. -CBC, BMP -Zio patch -Echocardiogram as above to evaluate for worsening right heart function -No unilateral leg swelling, will hold on venous Dopplers at this time  CAD: No chest pain currently. Prior [facility] with minimal disease. - Continue statin  Follow up in 3 months  Clearnce Hasten, MD Advanced Heart Failure Mechanical Circulatory Support 06/15/23

## 2023-06-15 ENCOUNTER — Other Ambulatory Visit (HOSPITAL_COMMUNITY): Payer: Self-pay | Admitting: Cardiology

## 2023-06-15 ENCOUNTER — Ambulatory Visit (HOSPITAL_COMMUNITY)
Admission: RE | Admit: 2023-06-15 | Discharge: 2023-06-15 | Disposition: A | Discharge status: Home / Self Care | Source: Ambulatory Visit | Attending: Cardiology | Admitting: Cardiology

## 2023-06-15 ENCOUNTER — Encounter (HOSPITAL_COMMUNITY): Payer: Self-pay | Admitting: Cardiology

## 2023-06-15 ENCOUNTER — Inpatient Hospital Stay (HOSPITAL_COMMUNITY)
Admission: RE | Admit: 2023-06-15 | Discharge: 2023-06-15 | Disposition: A | Discharge status: Home / Self Care | Source: Ambulatory Visit | Attending: Cardiology | Admitting: Cardiology

## 2023-06-15 VITALS — BP 120/88 | HR 120

## 2023-06-15 DIAGNOSIS — I429 Cardiomyopathy, unspecified: Secondary | ICD-10-CM | POA: Insufficient documentation

## 2023-06-15 DIAGNOSIS — R002 Palpitations: Secondary | ICD-10-CM

## 2023-06-15 DIAGNOSIS — R000 Tachycardia, unspecified: Secondary | ICD-10-CM | POA: Insufficient documentation

## 2023-06-15 DIAGNOSIS — I2510 Atherosclerotic heart disease of native coronary artery without angina pectoris: Secondary | ICD-10-CM | POA: Diagnosis not present

## 2023-06-15 DIAGNOSIS — I10 Essential (primary) hypertension: Secondary | ICD-10-CM

## 2023-06-15 DIAGNOSIS — I272 Pulmonary hypertension, unspecified: Secondary | ICD-10-CM

## 2023-06-15 DIAGNOSIS — I509 Heart failure, unspecified: Secondary | ICD-10-CM | POA: Diagnosis not present

## 2023-06-15 DIAGNOSIS — I110 Hypertensive heart disease with heart failure: Secondary | ICD-10-CM | POA: Diagnosis not present

## 2023-06-15 LAB — BASIC METABOLIC PANEL
Anion gap: 10 (ref 5–15)
BUN: 12 mg/dL (ref 8–23)
CO2: 27 mmol/L (ref 22–32)
Calcium: 9.9 mg/dL (ref 8.9–10.3)
Chloride: 101 mmol/L (ref 98–111)
Creatinine, Ser: 0.88 mg/dL (ref 0.44–1.00)
GFR, Estimated: >60 mL/min (ref >60)
Glucose, Bld: 155 mg/dL — ABNORMAL HIGH (ref 70–99)
Potassium: 3.8 mmol/L (ref 3.5–5.1)
Sodium: 138 mmol/L (ref 135–145)

## 2023-06-15 LAB — CBC
HCT: 43.7 % (ref 36.0–46.0)
Hemoglobin: 14.7 g/dL (ref 12.0–15.0)
MCH: 28.8 pg (ref 26.0–34.0)
MCHC: 33.6 g/dL (ref 30.0–36.0)
MCV: 85.5 fL (ref 80.0–100.0)
Platelets: 396 10*3/uL (ref 150–400)
RBC: 5.11 MIL/uL (ref 3.87–5.11)
RDW: 14.2 % (ref 11.5–15.5)
WBC: 7 10*3/uL (ref 4.0–10.5)
nRBC: 0 % (ref 0.0–0.2)

## 2023-06-15 LAB — BRAIN NATRIURETIC PEPTIDE: B Natriuretic Peptide: 5.3 pg/mL (ref 0.0–100.0)

## 2023-06-15 NOTE — Patient Instructions (Signed)
 There has been no changes to your medications.  Labs done today, your results will be available in MyChart, we will contact you for abnormal readings.  Your provider has recommended that  you wear a Zio Patch for 14 days.  This monitor will record your heart rhythm for our review.  IF you have any symptoms while wearing the monitor please press the button.  If you have any issues with the patch or you notice a red or orange light on it please call the company at [phone].  Once you remove the patch please mail it back to the company as soon as possible so we can get the results.  Your physician has requested that you have an echocardiogram. Echocardiography is a painless test that uses sound waves to create images of your heart. It provides your doctor with information about the size and shape of your heart and how well your heart's chambers and valves are working. This procedure takes approximately one hour. There are no restrictions for this procedure. Please do NOT wear cologne, perfume, aftershave, or lotions (deodorant is allowed). Please arrive 15 minutes prior to your appointment time.  Please note: We ask at that you not bring children with you during ultrasound (echo/ vascular) testing. Due to room size and safety concerns, children are not allowed in the ultrasound rooms during exams. Our front office staff cannot provide observation of children in our lobby area while testing is being conducted. An adult accompanying a patient to their appointment will only be allowed in the ultrasound room at the discretion of the ultrasound technician under special circumstances. We apologize for any inconvenience.  Your physician recommends that you schedule a follow-up appointment in: 3 months with an echocardiogram (June) ** PLEASE CALL THE OFFICE IN APRIL TO ARRANGE YOUR FOLLOW UP APPOINTMENT.**  If you have any questions or concerns before your next appointment please send [place] a message through  [place] or call our office at [phone].    TO LEAVE A MESSAGE FOR THE NURSE SELECT OPTION 2, PLEASE LEAVE A MESSAGE INCLUDING: YOUR NAME DATE OF BIRTH CALL BACK NUMBER REASON FOR CALL**this is important as we prioritize the call backs  YOU WILL RECEIVE A CALL BACK THE SAME DAY AS LONG AS YOU CALL BEFORE 4:00 PM  At the [facility], you and your health needs are our priority. As part of our continuing mission to provide you with exceptional heart care, we have created designated Provider Care Teams. These Care Teams include your primary Cardiologist (physician) and Advanced Practice Providers (APPs- Physician Assistants and Nurse Practitioners) who all work together to provide you with the care you need, when you need it.   You may see any of the following providers on your designated Care Team at your next follow up: Dr Arvilla Meres Dr Marca Ancona Dr. Dorthula Nettles Dr. Clearnce Hasten Amy Filbert Schilder, NP Robbie Lis, [place] [facility] [place], [place] Brynda Peon, NP [place] Lee, NP Clarisa Kindred, NP Karle Plumber, PharmD Enos Fling, PharmD   Please be sure to bring in all your medications bottles to every appointment.    Thank you for choosing [facility]

## 2023-06-17 DIAGNOSIS — Z79899 Other long term (current) drug therapy: Secondary | ICD-10-CM | POA: Diagnosis not present

## 2023-06-29 ENCOUNTER — Encounter (HOSPITAL_COMMUNITY): Payer: Self-pay

## 2023-07-02 DIAGNOSIS — R002 Palpitations: Secondary | ICD-10-CM | POA: Diagnosis not present

## 2023-07-08 DIAGNOSIS — Z87891 Personal history of nicotine dependence: Secondary | ICD-10-CM | POA: Diagnosis not present

## 2023-07-08 DIAGNOSIS — M25562 Pain in left knee: Secondary | ICD-10-CM | POA: Diagnosis not present

## 2023-07-08 DIAGNOSIS — M51369 Other intervertebral disc degeneration, lumbar region without mention of lumbar back pain or lower extremity pain: Secondary | ICD-10-CM | POA: Diagnosis not present

## 2023-07-09 ENCOUNTER — Emergency Department (HOSPITAL_COMMUNITY)

## 2023-07-09 ENCOUNTER — Emergency Department (HOSPITAL_COMMUNITY)
Admission: EM | Admit: 2023-07-09 | Discharge: 2023-07-09 | Disposition: A | Discharge status: Home / Self Care | Attending: Emergency Medicine | Admitting: Emergency Medicine

## 2023-07-09 ENCOUNTER — Other Ambulatory Visit: Payer: Self-pay

## 2023-07-09 ENCOUNTER — Encounter (HOSPITAL_COMMUNITY): Payer: Self-pay

## 2023-07-09 DIAGNOSIS — R002 Palpitations: Secondary | ICD-10-CM | POA: Diagnosis not present

## 2023-07-09 DIAGNOSIS — Z79899 Other long term (current) drug therapy: Secondary | ICD-10-CM | POA: Diagnosis not present

## 2023-07-09 DIAGNOSIS — Z91040 Latex allergy status: Secondary | ICD-10-CM | POA: Diagnosis not present

## 2023-07-09 DIAGNOSIS — Z7982 Long term (current) use of aspirin: Secondary | ICD-10-CM | POA: Insufficient documentation

## 2023-07-09 DIAGNOSIS — R000 Tachycardia, unspecified: Secondary | ICD-10-CM | POA: Insufficient documentation

## 2023-07-09 DIAGNOSIS — I10 Essential (primary) hypertension: Secondary | ICD-10-CM | POA: Insufficient documentation

## 2023-07-09 DIAGNOSIS — R079 Chest pain, unspecified: Secondary | ICD-10-CM | POA: Diagnosis not present

## 2023-07-09 DIAGNOSIS — Y900 Blood alcohol level of less than 20 mg/100 ml: Secondary | ICD-10-CM | POA: Diagnosis not present

## 2023-07-09 DIAGNOSIS — F1310 Sedative, hypnotic or anxiolytic abuse, uncomplicated: Secondary | ICD-10-CM | POA: Insufficient documentation

## 2023-07-09 LAB — URINALYSIS, W/ REFLEX TO CULTURE (INFECTION SUSPECTED)
Bacteria, UA: NONE SEEN
Bilirubin Urine: NEGATIVE
Glucose, UA: NEGATIVE mg/dL
Hgb urine dipstick: NEGATIVE
Ketones, ur: NEGATIVE mg/dL
Leukocytes,Ua: NEGATIVE
Nitrite: NEGATIVE
Protein, ur: NEGATIVE mg/dL
Specific Gravity, Urine: 1.012 (ref 1.005–1.030)
pH: 8 (ref 5.0–8.0)

## 2023-07-09 LAB — COMPREHENSIVE METABOLIC PANEL WITH GFR
ALT: 17 U/L (ref 0–44)
AST: 26 U/L (ref 15–41)
Albumin: 4.1 g/dL (ref 3.5–5.0)
Alkaline Phosphatase: 105 U/L (ref 38–126)
Anion gap: 9 (ref 5–15)
BUN: 8 mg/dL (ref 8–23)
CO2: 28 mmol/L (ref 22–32)
Calcium: 9.5 mg/dL (ref 8.9–10.3)
Chloride: 102 mmol/L (ref 98–111)
Creatinine, Ser: 0.79 mg/dL (ref 0.44–1.00)
GFR, Estimated: >60 mL/min (ref >60)
Glucose, Bld: 109 mg/dL — ABNORMAL HIGH (ref 70–99)
Potassium: 3.6 mmol/L (ref 3.5–5.1)
Sodium: 139 mmol/L (ref 135–145)
Total Bilirubin: 0.4 mg/dL (ref 0.0–1.2)
Total Protein: 7.8 g/dL (ref 6.5–8.1)

## 2023-07-09 LAB — CBC WITH DIFFERENTIAL/PLATELET
Abs Immature Granulocytes: 0.03 10*3/uL (ref 0.00–0.07)
Basophils Absolute: 0.1 10*3/uL (ref 0.0–0.1)
Basophils Relative: 1 %
Eosinophils Absolute: 0.4 10*3/uL (ref 0.0–0.5)
Eosinophils Relative: 4 %
HCT: 43.7 % (ref 36.0–46.0)
Hemoglobin: 14.5 g/dL (ref 12.0–15.0)
Immature Granulocytes: 0 %
Lymphocytes Relative: 44 %
Lymphs Abs: 4.2 10*3/uL — ABNORMAL HIGH (ref 0.7–4.0)
MCH: 28.9 pg (ref 26.0–34.0)
MCHC: 33.2 g/dL (ref 30.0–36.0)
MCV: 87.1 fL (ref 80.0–100.0)
Monocytes Absolute: 0.8 10*3/uL (ref 0.1–1.0)
Monocytes Relative: 8 %
Neutro Abs: 4 10*3/uL (ref 1.7–7.7)
Neutrophils Relative %: 43 %
Platelets: 381 10*3/uL (ref 150–400)
RBC: 5.02 MIL/uL (ref 3.87–5.11)
RDW: 14.4 % (ref 11.5–15.5)
WBC: 9.4 10*3/uL (ref 4.0–10.5)
nRBC: 0 % (ref 0.0–0.2)

## 2023-07-09 LAB — RAPID URINE DRUG SCREEN, HOSP PERFORMED
Amphetamines: NOT DETECTED
Barbiturates: NOT DETECTED
Benzodiazepines: POSITIVE — AB
Cocaine: NOT DETECTED
Opiates: NOT DETECTED
Tetrahydrocannabinol: NOT DETECTED

## 2023-07-09 LAB — MAGNESIUM: Magnesium: 2.2 mg/dL (ref 1.7–2.4)

## 2023-07-09 LAB — TROPONIN I (HIGH SENSITIVITY)
Troponin I (High Sensitivity): 4 ng/L (ref <18)
Troponin I (High Sensitivity): 6 ng/L (ref <18)

## 2023-07-09 LAB — ETHANOL: Alcohol, Ethyl (B): <10 mg/dL (ref <10)

## 2023-07-09 LAB — TSH: TSH: 1.024 u[IU]/mL (ref 0.350–4.500)

## 2023-07-09 NOTE — Discharge Instructions (Signed)
 Return for any problem.  ?

## 2023-07-09 NOTE — ED Triage Notes (Signed)
 Pt arrived reporting she felt her heart facing earlier. Endorses intermittent cp and shob. No recent illness. States has hx of anxiety. Patient has seen cardiologist in the last month for echo and states that she was unable to complete the exam due to an allergy to adhesive used

## 2023-07-09 NOTE — ED Provider Notes (Signed)
 [place] EMERGENCY DEPARTMENT AT [facility] Provider Note   CSN: [id number] Arrival date & time: 07/09/23  1535     History  Chief Complaint  Patient presents with   Tachycardia    Gloria Lewis is a 68 y.o. female.  68 year old female with prior medical history as detailed below presents for evaluation.  Patient complains of persistent palpitations, rapid heart rate, elevated blood pressure.  This has been an ongoing issue for at least a month.  She reports that her symptoms felt somewhat worse today so she came to the ED.  She reports that both her PCP and her cardiologist have been attempting to medically manage her reported tachycardia and hypertension.  She has multiple allergies to multiple medications and this has been a challenge for her outpatient care providers.  The history is provided by the patient.       Home Medications Prior to Admission medications   Medication Sig Start Date End Date Taking? Authorizing Provider  acetaminophen (TYLENOL) 500 MG tablet Take 2 tablets (1,000 mg total) by mouth every 8 (eight) hours as needed for mild pain or moderate pain. 12/01/22   Jenne Pane, PA-C  ASHWAGANDHA PO Take 1 capsule by mouth 2 (two) times daily.    [provider]  aspirin EC 81 MG tablet Take 1 tablet (81 mg total) by mouth 2 (two) times daily. To prevent blood clots for 30 days after surgery. 12/01/22   Jenne Pane, PA-C  azelastine (ASTELIN) 0.1 % nasal spray Place 1 spray into both nostrils 2 (two) times daily. 01/24/20   [provider]  baclofen (LIORESAL) 10 MG tablet Take 1 tablet (10 mg total) by mouth every 8 (eight) hours as needed for muscle spasms. 12/01/22 12/01/23  Jenne Pane, PA-C  cetirizine (ZYRTEC ALLERGY) 10 MG tablet Take 1 tablet (10 mg total) by mouth daily. 08/17/19   Wallis Bamberg, PA-C  clonazePAM (KLONOPIN) 1 MG tablet Take 1 tablet qam & 2 tablets qhs 04/28/23   Plovsky, Earvin Hansen, MD  DEXILANT 30 MG  capsule Take 1 capsule by mouth daily. 01/31/20   [provider]  diphenhydrAMINE (BENADRYL) 25 MG tablet 1 tablet as needed 08/12/15   [provider]  Echinacea-Goldenseal LIQD Place 1 drop into the nose daily as needed.    [provider]  Ginger, Zingiber officinalis, (GINGER EXTRACT PO) Take by mouth.    [provider]  meloxicam (MOBIC) 15 MG tablet Take 1 tablet (15 mg total) by mouth daily as needed for pain (and inflammation). 12/01/22   Jenne Pane, PA-C  mirtazapine (REMERON) 30 MG tablet Take 1 tablet (30 mg total) by mouth at bedtime. 04/28/23 04/27/24  Archer Asa, MD  nortriptyline (PAMELOR) 50 MG capsule 2 qhs 04/28/23   Plovsky, Earvin Hansen, MD  ondansetron (ZOFRAN) 8 MG tablet Take 1 tablet (8 mg total) by mouth every 8 (eight) hours as needed for nausea or vomiting. 11/14/18   Drema Dallas, DO  oxyCODONE-acetaminophen (PERCOCET) 10-325 MG tablet Take 1 tablet by mouth in the morning, at noon, and at bedtime.    [provider]  triamterene-hydrochlorothiazide (DYAZIDE) 50-25 MG capsule Take 1 capsule by mouth daily.    [provider]  TURMERIC PO Take by mouth as needed.    [provider]  valACYclovir (VALTREX) 1000 MG tablet Take 1,000 mg by mouth daily.    [provider]      Allergies    Effexor [venlafaxine  hydrochloride], Latex, Penicillins, Zithromax [azithromycin dihydrate], Amlodipine, Atorvastatin, Butrans [buprenorphine], Codeine, Nucynta [tapentadol], Nurtec [rimegepant sulfate], Other, Pravastatin sodium, Propranolol hcl, Sumatriptan, Venlafaxine, Vicodin [hydrocodone-acetaminophen], Amoxicillin, Chantix [varenicline tartrate], Hydrocodone, Paroxetine hcl, and Tramadol    Review of Systems   Review of Systems  All other systems reviewed and are negative.   Physical Exam Updated Vital Signs BP (!) 166/106   Pulse (!) 125   Temp 98.6 F (37 C) (Oral)   Resp (!) 24   Ht 5\' 4"  (1.626 m)    Wt 110 kg   SpO2 95%   BMI 41.63 kg/m  Physical Exam Vitals and nursing note reviewed.  Constitutional:      General: She is not in acute distress.    Appearance: Normal appearance. She is well-developed.  HENT:     Head: Normocephalic and atraumatic.  Eyes:     Conjunctiva/sclera: Conjunctivae normal.     Pupils: Pupils are equal, round, and reactive to light.  Cardiovascular:     Rate and Rhythm: Regular rhythm. Tachycardia present.     Heart sounds: Normal heart sounds.  Pulmonary:     Effort: Pulmonary effort is normal. No respiratory distress.     Breath sounds: Normal breath sounds.  Abdominal:     General: There is no distension.     Palpations: Abdomen is soft.     Tenderness: There is no abdominal tenderness.  Musculoskeletal:        General: No deformity. Normal range of motion.     Cervical back: Normal range of motion and neck supple.  Skin:    General: Skin is warm and dry.  Neurological:     General: No focal deficit present.     Mental Status: She is alert and oriented to person, place, and time.     ED Results / Procedures / Treatments   Labs (all labs ordered are listed, but only abnormal results are displayed) Labs Reviewed  CBC WITH DIFFERENTIAL/PLATELET - Abnormal; Notable for the following components:      Result Value   Lymphs Abs 4.2 (*)    All other components within normal limits  COMPREHENSIVE METABOLIC PANEL WITH GFR  ETHANOL  MAGNESIUM  TSH  TROPONIN I (HIGH SENSITIVITY)    EKG EKG Interpretation Date/Time:  Friday July 09 2023 15:45:00 EDT Ventricular Rate:  125 PR Interval:  207 QRS Duration:  98 QT Interval:  336 QTC Calculation: 481 R Axis:   -32  Text Interpretation: Sinus tachycardia Ventricular premature complex Borderline prolonged PR interval Left axis deviation Nonspecific T abnormalities, lateral leads Confirmed by Kristine Royal [phone]) on 07/09/2023 3:58:49 PM  Radiology No results  found.  Procedures Procedures    Medications Ordered in ED Medications - No data to display  ED Course/ Medical Decision Making/ A&P                                 Medical Decision Making Amount and/or Complexity of Data Reviewed Labs: ordered. Radiology: ordered.    Medical Screen Complete  This patient presented to the ED with complaint of palpitations.  This complaint involves an extensive number of treatment options. The initial differential diagnosis includes, but is not limited to, arrhythmia, metabolic abnormality, ACS, etc.  This presentation is: Acute, Chronic, Self-Limited, Previously Undiagnosed, Uncertain Prognosis, Complicated, Systemic Symptoms, and Threat to Life/Bodily Function  Patient presents with atypical chest pain and palpitations.  EKG changes without evidence of  acute ischemia or arrhythmia.  Troponins obtained are without significant elevation or delta.    Other screening labs obtained are without significant abnormality.  Patient is reassured by workup findings.  She understands need for close outpatient follow-up.  Strict return precautions given and understood.  Co morbidities that complicated the patient's evaluation  See HPI   Additional history obtained:  External records from outside sources obtained and reviewed including prior ED visits and prior Inpatient records.   Problem List / ED Course:  Palpitations   Disposition:  After consideration of the diagnostic results and the patients response to treatment, I feel that the patent would benefit from close outpatient follow-up.          Final Clinical Impression(s) / ED Diagnoses Final diagnoses:  Palpitations    Rx / DC Orders ED Discharge Orders     None         Wynetta Fines, MD 07/09/23 2159

## 2023-07-12 ENCOUNTER — Emergency Department (HOSPITAL_COMMUNITY)
Admission: EM | Admit: 2023-07-12 | Discharge: 2023-07-12 | Disposition: A | Discharge status: Home / Self Care | Attending: Emergency Medicine | Admitting: Emergency Medicine

## 2023-07-12 ENCOUNTER — Emergency Department (HOSPITAL_COMMUNITY)

## 2023-07-12 DIAGNOSIS — R002 Palpitations: Secondary | ICD-10-CM | POA: Diagnosis not present

## 2023-07-12 DIAGNOSIS — I493 Ventricular premature depolarization: Secondary | ICD-10-CM

## 2023-07-12 DIAGNOSIS — J984 Other disorders of lung: Secondary | ICD-10-CM | POA: Diagnosis not present

## 2023-07-12 DIAGNOSIS — E1165 Type 2 diabetes mellitus with hyperglycemia: Secondary | ICD-10-CM | POA: Insufficient documentation

## 2023-07-12 DIAGNOSIS — Z91040 Latex allergy status: Secondary | ICD-10-CM | POA: Diagnosis not present

## 2023-07-12 DIAGNOSIS — R0609 Other forms of dyspnea: Secondary | ICD-10-CM

## 2023-07-12 DIAGNOSIS — I700 Atherosclerosis of aorta: Secondary | ICD-10-CM | POA: Diagnosis not present

## 2023-07-12 DIAGNOSIS — R0602 Shortness of breath: Secondary | ICD-10-CM | POA: Insufficient documentation

## 2023-07-12 DIAGNOSIS — F17290 Nicotine dependence, other tobacco product, uncomplicated: Secondary | ICD-10-CM | POA: Diagnosis not present

## 2023-07-12 DIAGNOSIS — Z7982 Long term (current) use of aspirin: Secondary | ICD-10-CM | POA: Diagnosis not present

## 2023-07-12 DIAGNOSIS — R000 Tachycardia, unspecified: Secondary | ICD-10-CM | POA: Insufficient documentation

## 2023-07-12 DIAGNOSIS — I10 Essential (primary) hypertension: Secondary | ICD-10-CM | POA: Diagnosis not present

## 2023-07-12 DIAGNOSIS — R0789 Other chest pain: Secondary | ICD-10-CM

## 2023-07-12 LAB — CBC WITH DIFFERENTIAL/PLATELET
Abs Immature Granulocytes: 0.01 10*3/uL (ref 0.00–0.07)
Basophils Absolute: 0 10*3/uL (ref 0.0–0.1)
Basophils Relative: 1 %
Eosinophils Absolute: 0.2 10*3/uL (ref 0.0–0.5)
Eosinophils Relative: 4 %
HCT: 45.4 % (ref 36.0–46.0)
Hemoglobin: 14.7 g/dL (ref 12.0–15.0)
Immature Granulocytes: 0 %
Lymphocytes Relative: 50 %
Lymphs Abs: 2.9 10*3/uL (ref 0.7–4.0)
MCH: 28.7 pg (ref 26.0–34.0)
MCHC: 32.4 g/dL (ref 30.0–36.0)
MCV: 88.5 fL (ref 80.0–100.0)
Monocytes Absolute: 0.6 10*3/uL (ref 0.1–1.0)
Monocytes Relative: 11 %
Neutro Abs: 1.9 10*3/uL (ref 1.7–7.7)
Neutrophils Relative %: 34 %
Platelets: 389 10*3/uL (ref 150–400)
RBC: 5.13 MIL/uL — ABNORMAL HIGH (ref 3.87–5.11)
RDW: 14.6 % (ref 11.5–15.5)
WBC: 5.7 10*3/uL (ref 4.0–10.5)
nRBC: 0 % (ref 0.0–0.2)

## 2023-07-12 LAB — COMPREHENSIVE METABOLIC PANEL WITH GFR
ALT: 15 U/L (ref 0–44)
AST: 33 U/L (ref 15–41)
Albumin: 3.7 g/dL (ref 3.5–5.0)
Alkaline Phosphatase: 91 U/L (ref 38–126)
Anion gap: 6 (ref 5–15)
BUN: 10 mg/dL (ref 8–23)
CO2: 31 mmol/L (ref 22–32)
Calcium: 9.8 mg/dL (ref 8.9–10.3)
Chloride: 101 mmol/L (ref 98–111)
Creatinine, Ser: 0.98 mg/dL (ref 0.44–1.00)
GFR, Estimated: >60 mL/min (ref >60)
Glucose, Bld: 101 mg/dL — ABNORMAL HIGH (ref 70–99)
Potassium: 4.6 mmol/L (ref 3.5–5.1)
Sodium: 138 mmol/L (ref 135–145)
Total Bilirubin: 0.5 mg/dL (ref 0.0–1.2)
Total Protein: 6.9 g/dL (ref 6.5–8.1)

## 2023-07-12 LAB — TROPONIN I (HIGH SENSITIVITY)
Troponin I (High Sensitivity): 5 ng/L (ref <18)
Troponin I (High Sensitivity): 4 ng/L (ref <18)

## 2023-07-12 LAB — BRAIN NATRIURETIC PEPTIDE: B Natriuretic Peptide: 3.7 pg/mL (ref 0.0–100.0)

## 2023-07-12 MED ORDER — ACETAMINOPHEN 500 MG PO TABS
1000.0000 mg | ORAL_TABLET | Freq: Once | ORAL | Status: AC
  Administered 2023-07-12: 1000 mg via ORAL
  Filled 2023-07-12: qty 2

## 2023-07-12 MED ORDER — CARVEDILOL 3.125 MG PO TABS: 3.1250 mg | ORAL_TABLET | Freq: Two times a day (BID) | ORAL | 0 refills | Status: DC

## 2023-07-12 MED ORDER — CARVEDILOL 3.125 MG PO TABS
3.1250 mg | ORAL_TABLET | Freq: Two times a day (BID) | ORAL | Status: DC
  Administered 2023-07-12: 3.125 mg via ORAL
  Filled 2023-07-12: qty 1

## 2023-07-12 NOTE — ED Triage Notes (Signed)
 Pt states that she was at [facility] today for continued DOE, exertional CP, and HTN. Pt states that she was referred to ED for her symptoms, which have been ongoing for approximately three weeks.

## 2023-07-12 NOTE — Discharge Instructions (Addendum)
 You were seen in the ER today for evaluation of your palpitations.  Your workup is unremarkable.  Cardiology has seen you and would like to start you on a new medication called carvedilol.  Please take as prescribed.  They are going to order you outpatient studies.  Please make sure that you get these completed.  If you have any concerns, new or worsening symptoms, please return to your nearest emergency department for reevaluation.  Contact a doctor if: You keep having fast or uneven heartbeats for a long time. Your symptoms happen more often. Get help right away if: You have chest pain. You feel short of breath. You have a very bad headache. You feel dizzy. You faint. These symptoms may be an emergency. Get help right away. Call your local emergency services (911 in the [place] Do not wait to see if the symptoms will go away. Do not drive yourself to the hospital.

## 2023-07-12 NOTE — Consult Note (Addendum)
 Cardiology Consultation   Patient ID: BRINKLEE CISSE MRN: [id number]; DOB: Apr 14, 1955  Admit date: 07/12/2023 Date of Consult: 07/12/2023  PCP:  Laurann Montana, MD   [facility] Providers Cardiologist:  Dr Elwyn Lade    Patient Profile:   Gloria Lewis is a 68 y.o. female with a hx of HTN, pulmonary hypertension, tobacco use, bipolar, remote cocaine abuse, who is being seen 07/12/2023 for the evaluation of chest pain at the request of Dr Hyacinth Meeker.  History of Present Illness:   Ms. Gloria Lewis with above PMH who presented to ER today for heart palpitation. Patient states she had shoulder surgery 7 months ago. Since shoulder surgery, she has noted some increased SOB with exertion, such as walking upstairs. She reports chronic SOB when laying flat (her whole life) and always sleeps on her side. She states she had essentially daily heart "flutter" sensation and felt her HR is fast. He states her BP has been elevated lately as well. She went to [facility] ER on Friday night, BP was 180s and she had 45 minutes of mid-back pain radiating to her chest. She states her PCP started amlodipine recently but she can't tolerate due to ankle edema. She was concerned about having a heart attack. She states she takes triamterene hydrochlorothiazide for HTN for over 10 years now and is complaint. She smokes average 1 cigarettes daily. She states she is not aware of any lung disease like COPD. She is chest pain free now. She does not want to stay hospitalized. She suppose to get Echo and Zio, but had skin rash to [facility] monitor tape and could not complete the study.   Admission workup so far showed unremarkable CBC diff and CMP. Hs trop 6 >5 >4. BNP 3.7. UA negative. UDS + benzo. CXR showed no acute findings. EKG showed sinus tachycardia 124bpm, no acute ST-T changes. Cardiology is consulted today for chest discomfort.    Per chart review, she was initially admitted for chest pain in 06/2014 and had negative Lexiscan  cardiolite. VQ scan was high risk for PE but subsequent CTA chest was negative for PE, RML and lingular scarring was felt causing VQ scan to be abnormal. Echo 2016 showed EF 55-60%, mid-apical inferior hypokinesis, RV mildly dilated with normal systolic function, PA 42 mmHg.  PFT 2016 showed minimal obstruction or restriction, DLCO was moderately reduced. RHC on 11/09/14 showed near-normal right and left heart filling pressures. Borderline elevated PA pressure. RA 9, RV 35/8. PA mean 22, PCWP 7, CO 6.9, CI 3.27.   She followed up with Dr Shirlee Latch in 08/2017 for dyspnea, Echo from 07/01/17 showed LVEF 60-65%, grade I DD, mild to mod TR, PA .  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 She was recommended sleepy study at the time.   She was lost to follow up with [facility] heart care until 06/15/2023, where she saw Dr Elwyn Lade, states she has been doing well, had some palpitation since her shoulder surgery in August. She had no significant CHF symptoms. Repeat Echo has been requested. She was noted with sinus tachycardia on EKG, recommended labs and Zio patch for further workup. Both Echo and Zio monitor had not been completed.     Past Medical History:  Diagnosis Date   Anginal pain (HCC)    admit 06/2014; had non-ischemic stress  test   Anxiety    Bipolar 1 disorder (HCC)    Colon polyp    Complication of anesthesia    pt reports hx of waking up during anesthesia   CTS (carpal tunnel syndrome)    Depression    Diabetes mellitus without complication (HCC)    Fever blister    GERD (gastroesophageal reflux disease)    HA (headache)    HTN (hypertension)    Hypercholesterolemia    Migraines    OA (osteoarthritis)    Schizo-affective psychosis (HCC)     Past Surgical History:  Procedure Laterality Date   ANTERIOR CERVICAL  DECOMP/DISCECTOMY FUSION  08/27/2011   Procedure: ANTERIOR CERVICAL DECOMPRESSION/DISCECTOMY FUSION 1 LEVEL/HARDWARE REMOVAL;  Surgeon: Tia Alert, MD;  Location: [facility] NEURO ORS;  Service: Neurosurgery;  Laterality: Bilateral;  Cervical four-five Anterior cervical decompression/diskectomy, fusion, Plate, Removal of Cervical five-seven Plate   back injection     CARDIAC CATHETERIZATION N/A 11/09/2014   Procedure: Right Heart Cath;  Surgeon: Laurey Morale, MD;  Location: [facility] INVASIVE CV LAB;  Service: Cardiovascular;  Laterality: N/A;   CARPAL TUNNEL RELEASE  20110 rt/lt   rt x2 , lt x1   COLONOSCOPY  06/2017   [facility]   ESOPHAGOGASTRODUODENOSCOPY  06/2017   [facility]   HEMORRHOID SURGERY     MULTIPLE TOOTH EXTRACTIONS     NECK SURGERY  2009   PITUITARY SURGERY     Had gland removed from producing too much calcium   polp removed  2011   RIGHT/LEFT HEART CATH AND CORONARY ANGIOGRAPHY N/A 07/05/2017   Procedure: RIGHT/LEFT HEART CATH AND CORONARY ANGIOGRAPHY;  Surgeon: Laurey Morale, MD;  Location: [facility] INVASIVE CV LAB;  Service: Cardiovascular;  Laterality: N/A;   SHOULDER ARTHROSCOPY Left 12/01/2022   Procedure: ARTHROSCOPY SHOULDER, BICEPS TENODESIS, ROTATOR CUFF REPAIR;  Surgeon: Sheral Apley, MD;  Location:  SURGERY CENTER;  Service: Orthopedics;  Laterality: Left;   SHOULDER ARTHROSCOPY WITH ROTATOR CUFF REPAIR Right 05/23/2015   Procedure: RIGHT SHOULDER ARTHROSCOPY WITH REMOVAL OF SUTURE ANCHOR AND POSSIBLE REVISION ROTATOR CUFF REPAIR;  Surgeon: Francena Hanly, MD;  Location: [facility] OR;  Service: Orthopedics;  Laterality: Right;   SHOULDER ARTHROSCOPY WITH SUBACROMIAL DECOMPRESSION Right 01/24/2015   Procedure: RIGHT SHOULDER ARTHROSCOPY WITH SUBACROMIAL DECOMPRESSION AD DISTAL CLAVICLE RESECTION ;  Surgeon: Francena Hanly, MD;  Location: [facility] OR;  Service: Orthopedics;  Laterality: Right;   VAGINAL DELIVERY     x3     Home Medications:  Prior to  Admission medications   Medication Sig Start Date End Date Taking? Authorizing Provider  acetaminophen (TYLENOL) 500 MG tablet Take 2 tablets (1,000 mg total) by mouth every 8 (eight) hours as needed for mild pain or moderate pain. 12/01/22   Jenne Pane, PA-C  ASHWAGANDHA PO Take 1 capsule by mouth 2 (two) times daily.    [provider]  aspirin EC 81 MG tablet Take 1 tablet (81 mg total) by mouth 2 (two) times daily. To prevent blood clots for 30 days after surgery. 12/01/22   Jenne Pane, PA-C  azelastine (ASTELIN) 0.1 % nasal spray Place 1 spray into both nostrils 2 (two) times daily. 01/24/20   [provider]  baclofen (LIORESAL) 10 MG tablet Take 1 tablet (10 mg total) by mouth every 8 (eight) hours as needed for muscle spasms. 12/01/22 12/01/23  Jenne Pane, PA-C  cetirizine (ZYRTEC ALLERGY) 10 MG tablet Take 1 tablet (10 mg total) by mouth  daily. 08/17/19   Wallis Bamberg, PA-C  clonazePAM Scarlette Calico) 1 MG tablet Take 1 tablet qam & 2 tablets qhs 04/28/23   Plovsky, Earvin Hansen, MD  DEXILANT 30 MG capsule Take 1 capsule by mouth daily. 01/31/20   [provider]  diphenhydrAMINE (BENADRYL) 25 MG tablet 1 tablet as needed 08/12/15   [provider]  Echinacea-Goldenseal LIQD Place 1 drop into the nose daily as needed.    [provider]  Ginger, Zingiber officinalis, (GINGER EXTRACT PO) Take by mouth.    [provider]  meloxicam (MOBIC) 15 MG tablet Take 1 tablet (15 mg total) by mouth daily as needed for pain (and inflammation). 12/01/22   Jenne Pane, PA-C  mirtazapine (REMERON) 30 MG tablet Take 1 tablet (30 mg total) by mouth at bedtime. 04/28/23 04/27/24  Archer Asa, MD  nortriptyline (PAMELOR) 50 MG capsule 2 qhs 04/28/23   Plovsky, Earvin Hansen, MD  ondansetron (ZOFRAN) 8 MG tablet Take 1 tablet (8 mg total) by mouth every 8 (eight) hours as needed for nausea or vomiting. 11/14/18   Drema Dallas, DO  oxyCODONE-acetaminophen (PERCOCET)  10-325 MG tablet Take 1 tablet by mouth in the morning, at noon, and at bedtime.    [provider]  triamterene-hydrochlorothiazide (DYAZIDE) 50-25 MG capsule Take 1 capsule by mouth daily.    [provider]  TURMERIC PO Take by mouth as needed.    [provider]  valACYclovir (VALTREX) 1000 MG tablet Take 1,000 mg by mouth daily.    [provider]    Inpatient Medications: Scheduled Meds:  Continuous Infusions:  PRN Meds:   Allergies:    Allergies  Allergen Reactions   Effexor [Venlafaxine Hydrochloride] Itching and Other (See Comments)    headache   Latex Itching and Rash    Other reaction(s): Unknown   Penicillins Hives    Has patient had a PCN reaction causing immediate rash, facial/tongue/throat swelling, SOB or lightheadedness with hypotension: Yes Has patient had a PCN reaction causing severe rash involving mucus membranes or skin necrosis: No Has patient had a PCN reaction that required hospitalization No Has patient had a PCN reaction occurring within the last 10 years: No If all of the above answers are "NO", then may proceed with Cephalosporin use.    Zithromax [Azithromycin Dihydrate] Swelling   Amlodipine Swelling   Atorvastatin Swelling    Muscle aches   Butrans [Buprenorphine] Other (See Comments)    Ulcers-"mouth would not heal"   Codeine Itching    Tolerable with benadryl   Nucynta [Tapentadol] Other (See Comments)    Ulcers inside of mouth   Nurtec [Rimegepant Sulfate] Nausea Only   Other     Other reaction(s): nausea Other reaction(s): itching Other reaction(s): itch Other reaction(s): HA Other reaction(s): Unknown   Pravastatin Sodium     Other reaction(s): itching   Propranolol Hcl     Other reaction(s): dyspnea   Sumatriptan     Other reaction(s): Unknown   Venlafaxine     Other reaction(s): HA   Vicodin [Hydrocodone-Acetaminophen] Itching   Amoxicillin Itching    Other reaction(s): itching   Chantix  [Varenicline Tartrate] Nausea Only   Hydrocodone Itching    Tolerable with benadryl Other reaction(s): itch   Paroxetine Hcl Other (See Comments)    headache   Tramadol Other (See Comments)    Pt states it interacted with her sertraline, but she is no longer on sertraline.  She does not remember the type of reaction she  had.  Other reaction(s): Fainted    Social History:   Social History   Socioeconomic History   Marital status: Single    Spouse name: Not on file   Number of children: 3   Years of education: 12   Highest education level: Some college, no degree  Occupational History   Occupation: disabled/retired  Tobacco Use   Smoking status: Some Days    Types: Cigars   Smokeless tobacco: Never  Vaping Use   Vaping status: Some Days  Substance and Sexual Activity   Alcohol use: No    Alcohol/week: 0.0 standard drinks of alcohol   Drug use: No    Comment: hx crack addiction 2008   Sexual activity: Never  Other Topics Concern   Not on file  Social History Narrative   Lives in a two story home alone      [facility] in [place] for pain management      Right handed   12th grade      Caffeine coffee 1 cup /day   Social Drivers of Health   Financial Resource Strain: Medium Risk (03/12/2017)   Overall Financial Resource Strain (CARDIA)    Difficulty of Paying Living Expenses: Somewhat hard  Food Insecurity: Food Insecurity Present (03/12/2017)   Hunger Vital Sign    Worried About Running Out of Food in the Last Year: Sometimes true    Ran Out of Food in the Last Year: Sometimes true  Transportation Needs: Unmet Transportation Needs (03/12/2017)   PRAPARE - Transportation    Lack of Transportation (Medical): Yes    Lack of Transportation (Non-Medical): Yes  Physical Activity: Inactive (03/12/2017)   Exercise Vital Sign    Days of Exercise per Week: 0 days    Minutes of Exercise per Session: 0 min  Stress: Stress Concern Present (03/12/2017)   [employer] of  Occupational Health - Occupational Stress Questionnaire    Feeling of Stress : Rather much  Social Connections: Moderately Integrated (03/12/2017)   Social Connection and Isolation Panel [NHANES]    Frequency of Communication with Friends and Family: More than three times a week    Frequency of Social Gatherings with Friends and Family: More than three times a week    Attends Religious Services: More than 4 times per year    Active Member of [employer] or Organizations: Yes    Attends Engineer, structural: More than 4 times per year    Marital Status: Never married  Intimate Partner Violence: Not At Risk (03/12/2017)   Humiliation, Afraid, Rape, and Kick questionnaire    Fear of Current or Ex-Partner: No    Emotionally Abused: No    Physically Abused: No    Sexually Abused: No    Family History:    Family History  Problem Relation Age of Onset   Coronary artery disease Father    Cancer Father        head neck    Esophageal cancer Father    Hypertension Mother    Schizophrenia Mother    Diabetes Mother    Heart attack Mother    Depression Brother    Suicidality Brother    Prostate cancer Brother    Cancer Brother        bone marrow   Schizophrenia Maternal Grandmother    Anesthesia problems Neg Hx    Hypotension Neg Hx    Malignant hyperthermia Neg Hx    Pseudochol deficiency Neg Hx    Allergic rhinitis Neg Hx  Angioedema Neg Hx    Asthma Neg Hx    Atopy Neg Hx    Eczema Neg Hx    Immunodeficiency Neg Hx    Urticaria Neg Hx    Breast cancer Neg Hx      ROS:  Constitutional: Denied fever, chills, malaise, night sweats Eyes: Denied vision change or loss Ears/Nose/Mouth/Throat: Denied ear ache, sore throat, coughing, sinus pain Cardiovascular: see HPI  Respiratory: see HPI  Gastrointestinal: Denied nausea, vomiting, abdominal pain, diarrhea Genital/Urinary: Denied dysuria, hematuria, urinary frequency/urgency Musculoskeletal: Denied muscle ache, joint pain,  weakness Skin: Denied rash, wound Neuro: Denied headache, dizziness, syncope Psych: history of mood disorder  Endocrine: Denied history of diabetes   Physical Exam/Data:   Vitals:   07/12/23 1200 07/12/23 1215 07/12/23 1227 07/12/23 1230  BP: 121/85   121/72  Pulse: (!) 119 (!) 116  (!) 110  Resp: (!) 32 (!) 23  (!) 23  Temp:   98.5 F (36.9 C)   TempSrc:   Oral   SpO2: 100% 100%  100%  Weight:      Height:       No intake or output data in the 24 hours ending 07/12/23 1301    07/12/2023    8:28 AM 07/09/2023    3:45 PM 04/28/2023    2:21 PM  Last 3 Weights  Weight (lbs) 242 lb 242 lb 8.1 oz   Weight (kg) 109.77 kg 110 kg      Information is confidential and restricted. Go to Review Flowsheets to unlock data.     Body mass index is 37.9 kg/m.   Vitals:  Vitals:   07/12/23 1300 07/12/23 1400  BP: 124/82 123/82  Pulse: (!) 114 (!) 112  Resp: (!) 27 (!) 22  Temp:    SpO2: 100% 100%   General Appearance: In no apparent distress, laying in bed, well nourished  HEENT: Normocephalic, atraumatic.  Neck: Supple, trachea midline, no JVDs Cardiovascular: Regular rate and rhythm, tachycardiac, normal S1-S2,  no murmur Respiratory: Resting breathing unlabored, lungs sounds clear to auscultation bilaterally, no use of accessory muscles. On room air.  No wheezes, rales or rhonchi.   Gastrointestinal: Bowel sounds positive, abdomen soft, non-tender, non-distended. Obese  Extremities: Able to move all extremities in bed without difficulty, no edema of BLE Musculoskeletal: Normal muscle bulk and tone Skin: Intact, warm, dry. No rashes or petechiae noted in exposed areas.  Neurologic: Alert, oriented to person, place and time. no cognitive deficit, no gross focal neuro deficit Psychiatric: Normal affect. Mood is appropriate.     EKG:  The EKG was personally reviewed and demonstrates:    EKG today showed sinus tachycardia 124bpm, no ST-T changes  Telemetry:  Telemetry was  personally reviewed and demonstrates:    Sinus tachycardia 110s, occasional PVCs   Relevant CV Studies:  R/L heart cath 07/05/2017:  1. Nonobstructive coronary disease.   2. Normal filling pressures with mild pulmonary hypertension, PVR not significantly elevated.    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  Would not treat with pulmonary vasodilators.    Echo from 07/01/17:  - Left ventricle: The cavity size was normal. Wall thickness was    normal. Systolic function was normal. The estimated ejection    fraction was in the range of 60% to 65%. Doppler parameters are    consistent with abnormal left ventricular relaxation (grade 1    diastolic dysfunction).  - Tricuspid valve: There was mild-moderate regurgitation.  - Pulmonary arteries:  PA peak pressure: 41 mm Hg (S).    Laboratory Data:  High Sensitivity Troponin:   Recent Labs  Lab 07/09/23 1607 07/09/23 1813 07/12/23 0620 07/12/23 1038  TROPONINIHS 4 6 5 4      Chemistry Recent Labs  Lab 07/09/23 1607 07/12/23 0620  NA 139 138  K 3.6 4.6  CL 102 101  CO2 28 31  GLUCOSE 109* 101*  BUN 8 10  CREATININE 0.79 0.98  CALCIUM 9.5 9.8  MG 2.2  --   GFRNONAA >60 >60  ANIONGAP 9 6    Recent Labs  Lab 07/09/23 1607 07/12/23 0620  PROT 7.8 6.9  ALBUMIN 4.1 3.7  AST 26 33  ALT 17 15  ALKPHOS 105 91  BILITOT 0.4 0.5   Lipids No results for input(s): "CHOL", "TRIG", "HDL", "LABVLDL", "LDLCALC", "CHOLHDL" in the last 168 hours.  Hematology Recent Labs  Lab 07/09/23 1607 07/12/23 0620  WBC 9.4 5.7  RBC 5.02 5.13*  HGB 14.5 14.7  HCT 43.7 45.4  MCV 87.1 88.5  MCH 28.9 28.7  MCHC 33.2 32.4  RDW 14.4 14.6  PLT 381 389   Thyroid  Recent Labs  Lab 07/09/23 1616  TSH 1.024    BNP Recent Labs  Lab 07/12/23 0920  BNP 3.7    DDimer No results for input(s): "DDIMER" in the last 168 hours.   Radiology/Studies:  DG Chest Portable 1 View Result Date: 07/12/2023 CLINICAL  DATA:  Shortness of breath. EXAM: PORTABLE CHEST 1 VIEW COMPARISON:  07/09/2023 FINDINGS: Stable cardiomediastinal contours. Aortic atherosclerotic calcifications. No pleural fluid or interstitial edema. No airspace consolidation. Unchanged chronic linear opacities within the lower lung zones compatible with scarring. Visualized osseous structures appear intact. IMPRESSION: 1. No acute cardiopulmonary abnormalities. 2. Chronic scarring within the lower lung zones. 3. Aortic Atherosclerosis (ICD10-I70.0). Electronically Signed   By: Signa Kell M.D.   On: 07/12/2023 09:08   DG Chest Port 1 View Result Date: 07/09/2023 CLINICAL DATA:  Palpitations, intermittent chest pain, short of breath EXAM: PORTABLE CHEST 1 VIEW COMPARISON:  09/15/2019 FINDINGS: Single frontal view of the chest demonstrates stable enlargement of the cardiac silhouette. Chronic areas of scarring at the lung bases. No acute airspace disease, effusion, or pneumothorax. No acute bony abnormalities. IMPRESSION: 1. Stable enlarged cardiac silhouette. 2. No acute intrathoracic process. Electronically Signed   By: Sharlet Salina M.D.   On: 07/09/2023 16:57     Assessment and Plan:   Heart palpitation  Sinus tachycardia  - could not complete Zio monitor outpatient due to allergy to tape - will need event monitor for further evaluation, messaged office staff to see if alternatives can be arranged    DOE  Pulmonary hypertension  - bedside DOE, no frank symptoms suggestive of CHF, clinically euvolemic  - BNP 3.7 , CXR no acute findings - Outpatient Echo pending  - suspect symptom are pulmonary driven as she continue to smoke with underlying COPD, repeat outpatient PFT is reasonable    Non-obstructive CAD  - 2019 cath with minimal non-obstructive CAD - Hs trop negative x3 - EKG no acute ST-T changes - suspect chest discomfort was HTN urgency driven on Friday night at [facility] - Hold off ischemic evaluation, Echo as previously planned    HTN - BP normal here, has been elevated lately up to 180s per patient - will add coreg 3.125mg  BID for BP and heart palpitation      Risk Assessment/Risk Scores:    New York Heart Association (NYHA) Functional  Class NYHA Class II        For questions or updates, please contact [facility] Please consult [email or web address] for contact info under    Signed, Cyndi Bender, NP  07/12/2023 1:01 PM

## 2023-07-12 NOTE — ED Provider Notes (Cosign Needed Addendum)
 [facility] AT [facility] Provider Note   CSN: [id number] Arrival date & time: 07/12/23  7564     History Chief Complaint  Patient presents with   Shortness of Breath    Gloria Lewis is a 68 y.o. female with history of hypertension, anxiety depression presents to the emergency department today for evaluation of palpitations and tachycardia.  Patient reports of the last 3 months she has had worsening palpitations and tachycardia on exertion where she does have some shortness of breath but no chest pain.  She reports that 6 months ago she had a rotator cuff repair.  She has seen Dr. Elwyn Lade for this issue as well as her primary care provider.  She reports that on Saturday she felt pressure in her chest and went to [facility] emergency department and was discharged home. She does have occasional orthopnea.  She denies any unilateral or bilateral leg swelling.  Reports compliancy to her medications.  She was seen at the cardiologist and sent over here because of heart rate per patient.  She reports that her primary care doctor and cardiologist have been working on try to get her tachycardia under control.  She reports that her primary care is with as a medical and recently had a chest CT.   Shortness of Breath Associated symptoms: chest pain   Associated symptoms: no abdominal pain, no cough, no fever and no vomiting        Home Medications Prior to Admission medications   Medication Sig Start Date End Date Taking? Authorizing Provider  acetaminophen (TYLENOL) 500 MG tablet Take 2 tablets (1,000 mg total) by mouth every 8 (eight) hours as needed for mild pain or moderate pain. 12/01/22   Jenne Pane, PA-C  ASHWAGANDHA PO Take 1 capsule by mouth 2 (two) times daily.    [provider]  aspirin EC 81 MG tablet Take 1 tablet (81 mg total) by mouth 2 (two) times daily. To prevent blood clots for 30 days after surgery. 12/01/22   Jenne Pane, PA-C   azelastine (ASTELIN) 0.1 % nasal spray Place 1 spray into both nostrils 2 (two) times daily. 01/24/20   [provider]  baclofen (LIORESAL) 10 MG tablet Take 1 tablet (10 mg total) by mouth every 8 (eight) hours as needed for muscle spasms. 12/01/22 12/01/23  Jenne Pane, PA-C  cetirizine (ZYRTEC ALLERGY) 10 MG tablet Take 1 tablet (10 mg total) by mouth daily. 08/17/19   Wallis Bamberg, PA-C  clonazePAM (KLONOPIN) 1 MG tablet Take 1 tablet qam & 2 tablets qhs 04/28/23   Plovsky, Earvin Hansen, MD  DEXILANT 30 MG capsule Take 1 capsule by mouth daily. 01/31/20   [provider]  diphenhydrAMINE (BENADRYL) 25 MG tablet 1 tablet as needed 08/12/15   [provider]  Echinacea-Goldenseal LIQD Place 1 drop into the nose daily as needed.    [provider]  Ginger, Zingiber officinalis, (GINGER EXTRACT PO) Take by mouth.    [provider]  meloxicam (MOBIC) 15 MG tablet Take 1 tablet (15 mg total) by mouth daily as needed for pain (and inflammation). 12/01/22   Jenne Pane, PA-C  mirtazapine (REMERON) 30 MG tablet Take 1 tablet (30 mg total) by mouth at bedtime. 04/28/23 04/27/24  Archer Asa, MD  nortriptyline (PAMELOR) 50 MG capsule 2 qhs 04/28/23   Plovsky, Earvin Hansen, MD  ondansetron (ZOFRAN) 8 MG tablet Take 1 tablet (8 mg total) by mouth every 8 (eight) hours  as needed for nausea or vomiting. 11/14/18   Drema Dallas, DO  oxyCODONE-acetaminophen (PERCOCET) 10-325 MG tablet Take 1 tablet by mouth in the morning, at noon, and at bedtime.    [provider]  triamterene-hydrochlorothiazide (DYAZIDE) 50-25 MG capsule Take 1 capsule by mouth daily.    [provider]  TURMERIC PO Take by mouth as needed.    [provider]  valACYclovir (VALTREX) 1000 MG tablet Take 1,000 mg by mouth daily.    [provider]      Allergies    Effexor [venlafaxine hydrochloride], Latex, Penicillins, Zithromax [azithromycin dihydrate], Amlodipine,  Atorvastatin, Butrans [buprenorphine], Codeine, Nucynta [tapentadol], Nurtec [rimegepant sulfate], Other, Pravastatin sodium, Propranolol hcl, Sumatriptan, Venlafaxine, Vicodin [hydrocodone-acetaminophen], Amoxicillin, Chantix [varenicline tartrate], Hydrocodone, Paroxetine hcl, and Tramadol    Review of Systems   Review of Systems  Constitutional:  Negative for chills and fever.  HENT:  Negative for congestion and rhinorrhea.   Respiratory:  Positive for shortness of breath. Negative for cough.   Cardiovascular:  Positive for chest pain and palpitations. Negative for leg swelling.  Gastrointestinal:  Negative for abdominal pain, constipation, diarrhea, nausea and vomiting.  Neurological:  Negative for dizziness and light-headedness.    Physical Exam Updated Vital Signs BP 119/86   Pulse (!) 114   Temp 98.6 F (37 C)   Resp (!) 23   Ht 5\' 7"  (1.702 m)   Wt 109.8 kg   SpO2 100%   BMI 37.90 kg/m  Physical Exam Vitals and nursing note reviewed.  Constitutional:      General: She is not in acute distress.    Appearance: She is not ill-appearing or toxic-appearing.  Cardiovascular:     Rate and Rhythm: Regular rhythm. Tachycardia present.     Pulses:          Radial pulses are 2+ on the right side and 2+ on the left side.       Dorsalis pedis pulses are 2+ on the right side and 2+ on the left side.       Posterior tibial pulses are 2+ on the right side and 2+ on the left side.  Pulmonary:     Effort: Pulmonary effort is normal.     Breath sounds: Normal breath sounds. No decreased breath sounds.  Chest:     Chest wall: No tenderness.  Abdominal:     Palpations: Abdomen is soft.     Tenderness: There is no abdominal tenderness.  Musculoskeletal:     Right lower leg: No tenderness. No edema.     Left lower leg: No tenderness. No edema.  Skin:    General: Skin is warm and dry.  Neurological:     General: No focal deficit present.     Mental Status: She is alert.     ED  Results / Procedures / Treatments   Labs (all labs ordered are listed, but only abnormal results are displayed) Labs Reviewed  CBC WITH DIFFERENTIAL/PLATELET  COMPREHENSIVE METABOLIC PANEL WITH GFR  BRAIN NATRIURETIC PEPTIDE  TROPONIN I (HIGH SENSITIVITY)  TROPONIN I (HIGH SENSITIVITY)    EKG EKG Interpretation Date/Time:  Monday July 12 2023 08:32:00 EDT Ventricular Rate:  124 PR Interval:  152 QRS Duration:  90 QT Interval:  314 QTC Calculation: 451 R Axis:   94  Text Interpretation: Sinus tachycardia Rightward axis Borderline ECG When compared with ECG of 09-Jul-2023 15:45, PREVIOUS ECG IS PRESENT Confirmed by Eber Hong ([id number]) on 07/12/2023 8:57:01 AM  Radiology DG  Chest Portable 1 View Result Date: 07/12/2023 CLINICAL DATA:  Shortness of breath. EXAM: PORTABLE CHEST 1 VIEW COMPARISON:  07/09/2023 FINDINGS: Stable cardiomediastinal contours. Aortic atherosclerotic calcifications. No pleural fluid or interstitial edema. No airspace consolidation. Unchanged chronic linear opacities within the lower lung zones compatible with scarring. Visualized osseous structures appear intact. IMPRESSION: 1. No acute cardiopulmonary abnormalities. 2. Chronic scarring within the lower lung zones. 3. Aortic Atherosclerosis (ICD10-I70.0). Electronically Signed   By: Signa Kell M.D.   On: 07/12/2023 09:08    Procedures Procedures   Medications Ordered in ED Medications - No data to display  ED Course/ Medical Decision Making/ A&P                               Medical Decision Making Amount and/or Complexity of Data Reviewed Labs: ordered. Radiology: ordered.  Risk OTC drugs. Prescription drug management.   68 y.o. female presents to the ER for evaluation of palpitations. Differential diagnosis includes but is not limited to Cardiac arrhythmias, ACS, CHF, pericarditis, valvular disease, panic/anxiety, ETOH, stimulant use, medication side effect, anemia, hyperthyroidism, pulmonary  embolism. Vital signs blood pressure within normal limits, heart rate at 114, it shows respiratory rate 23 however patient does not appear tachypneic and speaking in full sentences in the room.  Satting 100% on room air. Physical exam as noted above.   On previous chart evaluation, patient was seen on 4 - 4 - 25 that was along Emergency Department.  She had labs performed including TSH and troponins all which were in within normal limits.  From previous chart reading her last progress note with cardiology on 06-15-2023, she has been complaining about these symptoms for a while and has been investigated.  From his note, he wanted her to have Zio patch and ECHO. He did not think this was a PE.  From previous chart evaluation also looks like this patient is been experiencing these problems for years with tachycardia, palpitations with some dyspnea on exertion.  Patient was sent from cardiologist office for patient however I do not see a note, I did page cardiology and spoke with Trish.  Someone from the office will come and see her.  Awaiting further recommendations.  I independently reviewed and interpreted the patient's labs.  CBC without leukocytosis or anemia.  BMP within normal limits.  Troponin at 5 with repeat at 4.  CMP shows mildly elevated glucose at 101 otherwise no electrolyte or LFT abnormality.  Dr. Rennis Golden has seen the patient at bedside. Will add on carvedilol and she is going to follow up outpatient.  Cardiology NP to place prescription order cardiology nor patient desire admission.  I have offered the patient a CT scan of her chest to rule out any blood clot in her lungs.  She declines this as she reports she just had a CT scan of her chest.  She reports that she would like to go home and follow-up with cardiology and her primary care outpatient.  She verbalized her understanding that she can return if she starts to have any new or worsening symptoms or if she wanted to return to get the CTA of  her chest.  She was given ginger ale and graham crackers without issue.  We discussed the results of the labs/imaging. The plan is take the medication as prescribed, follow-up with outpatient studies as ordered by cardiology. We discussed strict return precautions and red flag symptoms. The patient verbalized their  understanding and agrees to the plan. The patient is stable and being discharged home in good condition.  Portions of this report may have been transcribed using voice recognition software. Every effort was made to ensure accuracy; however, inadvertent computerized transcription errors may be present.    Final Clinical Impression(s) / ED Diagnoses Final diagnoses:  Palpitations    Rx / DC Orders ED Discharge Orders          Ordered    carvedilol (COREG) 3.125 MG tablet  2 times daily with meals        07/12/23 1709              Achille Rich, PA-C 07/12/23 1709    Achille Rich, PA-C 07/12/23 1710    Eber Hong, MD 07/14/23 1525

## 2023-07-13 ENCOUNTER — Other Ambulatory Visit: Payer: Self-pay | Admitting: Home Health

## 2023-07-13 ENCOUNTER — Telehealth (HOSPITAL_COMMUNITY): Payer: Self-pay | Admitting: Cardiology

## 2023-07-13 DIAGNOSIS — R002 Palpitations: Secondary | ICD-10-CM

## 2023-07-13 NOTE — Telephone Encounter (Signed)
-----   Message from Romie Minus sent at 07/13/2023  4:34 PM EDT ----- Regarding: FW: Echo Can we reschedule the echo for sooner than her follow up date?  Thanks, Romeo Apple ----- Message ----- From: Cyndi Bender, NP Sent: 07/12/2023   3:57 PM EDT To: Theresia Bough, CMA; Romie Minus, MD Subject: Echo                                           She was seen in the ER today for SOB and palpitation. Any possibility to get her Echo done sooner than 09/06/23? Thanks

## 2023-07-13 NOTE — Telephone Encounter (Signed)
 Echo scheduled with patient 5/1 @ 8a

## 2023-07-13 NOTE — Progress Notes (Signed)
 Patient was seen during inpatient consult for palpitation, reports unable wear Zio due to allergy to tape. Reached out to Andee Lineman, RN, who informed that "[employer]/ [employer] offers a long term monitor with sensitive skin electrodes.  Even if the patient has irritation from their patch they can reapply another patch in a different location, or take it off a day.  They could also call the company for yet another sensitive skin option.  They will provide 3-4 patches instead of just one like ZIO."   Re-ordered event monitor for  2 weeks, result to Dr Elwyn Lade, Will have staff set up for the patient.

## 2023-07-15 DIAGNOSIS — M51369 Other intervertebral disc degeneration, lumbar region without mention of lumbar back pain or lower extremity pain: Secondary | ICD-10-CM | POA: Diagnosis not present

## 2023-07-15 DIAGNOSIS — E119 Type 2 diabetes mellitus without complications: Secondary | ICD-10-CM | POA: Diagnosis not present

## 2023-07-15 DIAGNOSIS — E7800 Pure hypercholesterolemia, unspecified: Secondary | ICD-10-CM | POA: Diagnosis not present

## 2023-07-15 DIAGNOSIS — F17210 Nicotine dependence, cigarettes, uncomplicated: Secondary | ICD-10-CM | POA: Diagnosis not present

## 2023-07-15 DIAGNOSIS — N183 Chronic kidney disease, stage 3 unspecified: Secondary | ICD-10-CM | POA: Diagnosis not present

## 2023-07-15 DIAGNOSIS — E538 Deficiency of other specified B group vitamins: Secondary | ICD-10-CM | POA: Diagnosis not present

## 2023-07-15 DIAGNOSIS — Z79899 Other long term (current) drug therapy: Secondary | ICD-10-CM | POA: Diagnosis not present

## 2023-07-15 DIAGNOSIS — E559 Vitamin D deficiency, unspecified: Secondary | ICD-10-CM | POA: Diagnosis not present

## 2023-07-15 DIAGNOSIS — I10 Essential (primary) hypertension: Secondary | ICD-10-CM | POA: Diagnosis not present

## 2023-07-19 DIAGNOSIS — Z79899 Other long term (current) drug therapy: Secondary | ICD-10-CM | POA: Diagnosis not present

## 2023-07-26 NOTE — Addendum Note (Signed)
 Encounter addended by: Stan Eans, RN on: 07/26/2023 2:22 PM  Actions taken: Imaging Exam ended

## 2023-07-27 ENCOUNTER — Encounter (HOSPITAL_COMMUNITY): Payer: Self-pay | Admitting: Psychiatry

## 2023-07-27 ENCOUNTER — Ambulatory Visit (HOSPITAL_BASED_OUTPATIENT_CLINIC_OR_DEPARTMENT_OTHER): Payer: 59 | Admitting: Psychiatry

## 2023-07-27 ENCOUNTER — Other Ambulatory Visit: Payer: Self-pay

## 2023-07-27 VITALS — BP 123/89 | HR 128 | Ht 67.0 in | Wt 242.0 lb

## 2023-07-27 DIAGNOSIS — F320 Major depressive disorder, single episode, mild: Secondary | ICD-10-CM

## 2023-07-27 MED ORDER — NORTRIPTYLINE HCL 50 MG PO CAPS
ORAL_CAPSULE | ORAL | 4 refills | Status: DC
Start: 2023-07-27 — End: 2023-10-27

## 2023-07-27 MED ORDER — TEMAZEPAM 15 MG PO CAPS: 15.0000 mg | ORAL_CAPSULE | Freq: Every evening | ORAL | 4 refills | Status: DC | PRN

## 2023-07-27 MED ORDER — MIRTAZAPINE 30 MG PO TABS: 15.0000 mg | ORAL_TABLET | Freq: Every day | ORAL | 1 refills | Status: DC

## 2023-07-27 MED ORDER — CLONAZEPAM 1 MG PO TABS
ORAL_TABLET | ORAL | 3 refills | Status: DC
Start: 2023-07-27 — End: 2023-10-27

## 2023-07-27 NOTE — Progress Notes (Signed)
 Patient ID: Gloria Lewis, female   DOB: [birth year], 68 y.o.   MRN: [id number] [facility] MD Progress Note  07/27/2023 2:24 PM BRANIYA FARRUGIA  MRN:  [id number] Subjective:  Shoulder hurting Principal Problem: Major Depression,recurent Mild Diagnosis: Adjustment disorder with an anxious mood stat    The patient today is doing fairly well.  She has a number of medical conditions.  She did have poorly controlled hypertension but today is better.  She does have unexplainable tachycardia.  Even today to block her heart rate is above 110.  The patient is being evaluated for this at this time.  The patient also has some significant pain in her left shoulder since her shoulder operation.  She is seen at [facility] clinic where she gets OxyContin .  Is scheduled for 3 times a day but she just takes it 2 times a day.  The plans are that she will be ultimately off OxyContin  soon.  She says when she uses have not it is just as effective and she is like to be off of the OxyContin .  She knows she just cannot stop it cold.  The other big concern I think she has is her weight.  She continues to run well over 230 pounds.  Her mood is good.  She denies any use of alcohol or drugs.  Her son Melodee Spruce has moved back in with her.  He does not work.  He unfortunately is smoking some marijuana.  On the other hand he continues taking his psychiatric medications and for the most part he is fairly stable. Patient Active Problem List   Diagnosis Date Noted   Partial nontraumatic tear of left rotator cuff [M75.112] 12/01/2022   Infectious gastroenteritis [A09] 04/16/2016   Xerostomia [K11.7] 03/09/2016   Surgery, elective [Z41.9] 05/23/2015   S/P arthroscopy of shoulder [Z98.890] 05/23/2015   Chronic migraine without aura without status migrainosus, not intractable [G43.709] 10/18/2014   Tobacco abuse [Z72.0] 10/18/2014   Obesity [E66.9] 09/23/2014   COPD [J44.9] 09/02/2014   Pulmonary hypertension (HCC) [I27.20] 08/31/2014    Respiratory failure with hypoxia (HCC) [J96.91] 08/14/2014   Cigarette smoker [F17.210] 07/28/2014   Major depressive disorder, recurrent episode, moderate (HCC) [F33.1] 07/06/2014   Essential hypertension [I10]    SOB (shortness of breath) [R06.02] 06/21/2014   Precordial pain [R07.2] 06/21/2014   Gastroesophageal reflux disease [K21.9] 06/21/2014   Chest pain [R07.9] 06/21/2014   HTN (hypertension) [I10]    Neck pain [M54.2] 01/08/2014   Severe episode of recurrent major depressive disorder (HCC) [F33.2] 10/14/2012   Schizoaffective disorder (HCC) [F25.9] 05/26/2012   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] 10/06/2010   Hyperparathyroidism, primary (HCC) [E21.0] 10/06/2010   DEGENERATIVE DISC DISEASE, LUMBOSACRAL SPINE [M51.379] 05/21/2007   DERMATOPHYTOSIS OF THE BODY [B35.4] 05/10/2007   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] 03/24/2007   Anxiety state [F41.1] 03/24/2007   DENTAL PAIN [K08.9] 03/24/2007   SHOULDER PAIN, LEFT [M25.519] 03/24/2007   Total Time spent with patient:30 min  Past Psychiatric History:   Past Medical History:  Past Medical History:  Diagnosis Date   Anginal pain (HCC)    admit 06/2014; had non-ischemic stress test   Anxiety    Bipolar 1 disorder (HCC)    Colon polyp    Complication of anesthesia    pt reports hx of waking up during anesthesia   CTS (carpal tunnel syndrome)    Depression    Diabetes mellitus without complication (HCC)    Fever blister    GERD (gastroesophageal reflux disease)  HA (headache)    HTN (hypertension)    Hypercholesterolemia    Migraines    OA (osteoarthritis)    Schizo-affective psychosis (HCC)     Past Surgical History:  Procedure Laterality Date   ANTERIOR CERVICAL DECOMP/DISCECTOMY FUSION  08/27/2011   Procedure: ANTERIOR CERVICAL DECOMPRESSION/DISCECTOMY FUSION 1 LEVEL/HARDWARE REMOVAL;  Surgeon: Isadora Mar, MD;  Location: [facility] NEURO ORS;  Service: Neurosurgery;  Laterality: Bilateral;  Cervical four-five Anterior cervical  decompression/diskectomy, fusion, Plate, Removal of Cervical five-seven Plate   back injection     CARDIAC CATHETERIZATION N/A 11/09/2014   Procedure: Right Heart Cath;  Surgeon: Darlis Eisenmenger, MD;  Location: [facility] INVASIVE CV LAB;  Service: Cardiovascular;  Laterality: N/A;   CARPAL TUNNEL RELEASE  20110 rt/lt   rt x2 , lt x1   COLONOSCOPY  06/2017   [facility]   ESOPHAGOGASTRODUODENOSCOPY  06/2017   [facility]   HEMORRHOID SURGERY     MULTIPLE TOOTH EXTRACTIONS     NECK SURGERY  2009   PITUITARY SURGERY     Had gland removed from producing too much calcium    polp removed  2011   RIGHT/LEFT HEART CATH AND CORONARY ANGIOGRAPHY N/A 07/05/2017   Procedure: RIGHT/LEFT HEART CATH AND CORONARY ANGIOGRAPHY;  Surgeon: Darlis Eisenmenger, MD;  Location: [facility] INVASIVE CV LAB;  Service: Cardiovascular;  Laterality: N/A;   SHOULDER ARTHROSCOPY Left 12/01/2022   Procedure: ARTHROSCOPY SHOULDER, BICEPS TENODESIS, ROTATOR CUFF REPAIR;  Surgeon: Saundra Curl, MD;  Location: [facility];  Service: Orthopedics;  Laterality: Left;   SHOULDER ARTHROSCOPY WITH ROTATOR CUFF REPAIR Right 05/23/2015   Procedure: RIGHT SHOULDER ARTHROSCOPY WITH REMOVAL OF SUTURE ANCHOR AND POSSIBLE REVISION ROTATOR CUFF REPAIR;  Surgeon: Ellard Gunning, MD;  Location: [facility] OR;  Service: Orthopedics;  Laterality: Right;   SHOULDER ARTHROSCOPY WITH SUBACROMIAL DECOMPRESSION Right 01/24/2015   Procedure: RIGHT SHOULDER ARTHROSCOPY WITH SUBACROMIAL DECOMPRESSION AD DISTAL CLAVICLE RESECTION ;  Surgeon: Ellard Gunning, MD;  Location: [facility] OR;  Service: Orthopedics;  Laterality: Right;   VAGINAL DELIVERY     x3   Family History:  Family History  Problem Relation Age of Onset   Coronary artery disease Father    Cancer Father        head neck    Esophageal cancer Father    Hypertension Mother    Schizophrenia Mother    Diabetes Mother    Heart attack Mother    Depression Brother    Suicidality Brother     Prostate cancer Brother    Cancer Brother        bone marrow   Schizophrenia Maternal Grandmother    Anesthesia problems Neg Hx    Hypotension Neg Hx    Malignant hyperthermia Neg Hx    Pseudochol deficiency Neg Hx    Allergic rhinitis Neg Hx    Angioedema Neg Hx    Asthma Neg Hx    Atopy Neg Hx    Eczema Neg Hx    Immunodeficiency Neg Hx    Urticaria Neg Hx    Breast cancer Neg Hx    Family Psychiatric  History:  Social History:  Social History   Substance and Sexual Activity  Alcohol Use No   Alcohol/week: 0.0 standard drinks of alcohol     Social History   Substance and Sexual Activity  Drug Use No   Comment: hx crack addiction 2008    Social History   Socioeconomic History   Marital status: Single  Spouse name: Not on file   Number of children: 3   Years of education: 32   Highest education level: Some college, no degree  Occupational History   Occupation: disabled/retired  Tobacco Use   Smoking status: Some Days    Types: Cigars   Smokeless tobacco: Never  Vaping Use   Vaping status: Some Days  Substance and Sexual Activity   Alcohol use: No    Alcohol/week: 0.0 standard drinks of alcohol   Drug use: No    Comment: hx crack addiction 2008   Sexual activity: Never  Other Topics Concern   Not on file  Social History Narrative   Lives in a two story home alone      [place] in [place] for pain management      Right handed   12th grade      Caffeine coffee 1 cup /day   Social Drivers of Health   Financial Resource Strain: Medium Risk (03/12/2017)   Overall Financial Resource Strain (CARDIA)    Difficulty of Paying Living Expenses: Somewhat hard  Food Insecurity: Food Insecurity Present (03/12/2017)   Hunger Vital Sign    Worried About Running Out of Food in the Last Year: Sometimes true    Ran Out of Food in the Last Year: Sometimes true  Transportation Needs: Unmet Transportation Needs (03/12/2017)   PRAPARE - Transportation    Lack of  Transportation (Medical): Yes    Lack of Transportation (Non-Medical): Yes  Physical Activity: Inactive (03/12/2017)   Exercise Vital Sign    Days of Exercise per Week: 0 days    Minutes of Exercise per Session: 0 min  Stress: Stress Concern Present (03/12/2017)   [employer] of Occupational Health - Occupational Stress Questionnaire    Feeling of Stress : Rather much  Social Connections: Moderately Integrated (03/12/2017)   Social Connection and Isolation Panel [NHANES]    Frequency of Communication with Friends and Family: More than three times a week    Frequency of Social Gatherings with Friends and Family: More than three times a week    Attends Religious Services: More than 4 times per year    Active Member of [employer] or Organizations: Yes    Attends Engineer, structural: More than 4 times per year    Marital Status: Never married   Additional Social History:                         Sleep: Good  Appetite:  Fair  Current Medications: Current Outpatient Medications  Medication Sig Dispense Refill   temazepam  (RESTORIL ) 15 MG capsule Take 1 capsule (15 mg total) by mouth at bedtime as needed for sleep. 30 capsule 4   acetaminophen  (TYLENOL ) 500 MG tablet Take 2 tablets (1,000 mg total) by mouth every 8 (eight) hours as needed for mild pain or moderate pain. 60 tablet 0   ASHWAGANDHA PO Take 1 capsule by mouth 2 (two) times daily.     aspirin  EC 81 MG tablet Take 1 tablet (81 mg total) by mouth 2 (two) times daily. To prevent blood clots for 30 days after surgery. 60 tablet 0   azelastine  (ASTELIN ) 0.1 % nasal spray Place 1 spray into both nostrils 2 (two) times daily.     baclofen  (LIORESAL ) 10 MG tablet Take 1 tablet (10 mg total) by mouth every 8 (eight) hours as needed for muscle spasms. 21 tablet 0   carvedilol  (COREG ) 3.125 MG tablet Take  1 tablet (3.125 mg total) by mouth 2 (two) times daily with a meal. 60 tablet 0   cetirizine  (ZYRTEC  ALLERGY ) 10 MG  tablet Take 1 tablet (10 mg total) by mouth daily. 90 tablet 0   clonazePAM  (KLONOPIN ) 1 MG tablet Take 1 tablet qam & 2 tablets qhs 90 tablet 3   DEXILANT  30 MG capsule Take 1 capsule by mouth daily.     diphenhydrAMINE  (BENADRYL ) 25 MG tablet 1 tablet as needed     Echinacea-Goldenseal LIQD Place 1 drop into the nose daily as needed.     Ginger, Zingiber officinalis, (GINGER EXTRACT PO) Take by mouth.     meloxicam  (MOBIC ) 15 MG tablet Take 1 tablet (15 mg total) by mouth daily as needed for pain (and inflammation). 30 tablet 0   mirtazapine  (REMERON ) 30 MG tablet Take 0.5 tablets (15 mg total) by mouth at bedtime. 14 tablet 1   nortriptyline  (PAMELOR ) 50 MG capsule 2 qhs 60 capsule 4   ondansetron  (ZOFRAN ) 8 MG tablet Take 1 tablet (8 mg total) by mouth every 8 (eight) hours as needed for nausea or vomiting. 20 tablet 3   oxyCODONE -acetaminophen  (PERCOCET) 10-325 MG tablet Take 1 tablet by mouth in the morning, at noon, and at bedtime.     triamterene -hydrochlorothiazide  (DYAZIDE ) 50-25 MG capsule Take 1 capsule by mouth daily.     TURMERIC PO Take by mouth as needed.     valACYclovir (VALTREX) 1000 MG tablet Take 1,000 mg by mouth daily.     No current facility-administered medications for this visit.    Lab Results: No results found for this or any previous visit (from the past 48 hours).  Physical Findings: AIMS:  , ,  ,  ,    CIWA:    COWS:     Musculoskeletal: Strength & Muscle Tone: within normal limits Gait & Station: normal Patient leans: N/A  Psychiatric Specialty Exam: ROS  Blood pressure 123/89, pulse (!) 128, height 5\' 7"  (1.702 m), weight 242 lb (109.8 kg).Body mass index is 37.9 kg/m.  General Appearance: Casual  Eye Contact::  Good  Speech:  Clear and Coherent  Volume:  Normal  Mood:  Euthymic  Affect:  Congruent  Thought Process:  Coherent  Orientation:  Full (Time, Place, and Person)  Thought Content:  WDL  Suicidal Thoughts:  No  Homicidal Thoughts:   No  Memory:  NA  Judgement:  Good  Insight:  Fair  Psychomotor Activity:  Normal  Concentration:  Fair  Recall:  Good  Fund of Knowledge:Good  Language: Good  Akathisia:  No  Handed:  Right  AIMS (if indicated):     Assets:   ADL's:  Intact  Cognition: WNL  Sleep:       Treatment Plan  07/27/2023, 2:24 PM   This patient's diagnosis is that of major depression.  She will continue taking nortriptyline  50 mg.  Today however regard to reduce her Remeron  down to 15 mg for 2 weeks and then discontinue it.  I thinking is that Remeron  may be contributing to her persistent obesity.  Her second problem is an adjustment disorder with an anxious mood state.  She will continue take Klonopin  1 mg 1 in the morning and 2 at night.  It is noted the patient has been clean and drugs for almost 20 years now.  Her third problem is insomnia.  She does extremely well taking Restoril  15 mg.  Generally she sleeps and eats well has got good energy  and is just trying to get over a lot of shoulder pain from her surgery.  The patient tries to exercise as much as she can.  By the time I see her hopefully she will be off the OxyContin .  She will return to see me in 3 months.

## 2023-07-28 DIAGNOSIS — I10 Essential (primary) hypertension: Secondary | ICD-10-CM | POA: Diagnosis not present

## 2023-07-28 DIAGNOSIS — J449 Chronic obstructive pulmonary disease, unspecified: Secondary | ICD-10-CM | POA: Diagnosis not present

## 2023-07-28 DIAGNOSIS — J4530 Mild persistent asthma, uncomplicated: Secondary | ICD-10-CM | POA: Diagnosis not present

## 2023-07-28 DIAGNOSIS — J309 Allergic rhinitis, unspecified: Secondary | ICD-10-CM | POA: Diagnosis not present

## 2023-07-28 DIAGNOSIS — K219 Gastro-esophageal reflux disease without esophagitis: Secondary | ICD-10-CM | POA: Diagnosis not present

## 2023-07-28 DIAGNOSIS — F17210 Nicotine dependence, cigarettes, uncomplicated: Secondary | ICD-10-CM | POA: Diagnosis not present

## 2023-07-28 DIAGNOSIS — E785 Hyperlipidemia, unspecified: Secondary | ICD-10-CM | POA: Diagnosis not present

## 2023-07-28 DIAGNOSIS — G720 Drug-induced myopathy: Secondary | ICD-10-CM | POA: Diagnosis not present

## 2023-07-28 DIAGNOSIS — E1142 Type 2 diabetes mellitus with diabetic polyneuropathy: Secondary | ICD-10-CM | POA: Diagnosis not present

## 2023-07-28 DIAGNOSIS — F17200 Nicotine dependence, unspecified, uncomplicated: Secondary | ICD-10-CM | POA: Diagnosis not present

## 2023-08-02 DIAGNOSIS — R002 Palpitations: Secondary | ICD-10-CM | POA: Diagnosis not present

## 2023-08-05 ENCOUNTER — Ambulatory Visit (HOSPITAL_COMMUNITY)
Admission: RE | Admit: 2023-08-05 | Discharge: 2023-08-05 | Disposition: A | Discharge status: Home / Self Care | Source: Ambulatory Visit | Attending: Internal Medicine | Admitting: Internal Medicine

## 2023-08-05 DIAGNOSIS — E119 Type 2 diabetes mellitus without complications: Secondary | ICD-10-CM | POA: Diagnosis not present

## 2023-08-05 DIAGNOSIS — J449 Chronic obstructive pulmonary disease, unspecified: Secondary | ICD-10-CM | POA: Diagnosis not present

## 2023-08-05 DIAGNOSIS — I110 Hypertensive heart disease with heart failure: Secondary | ICD-10-CM | POA: Insufficient documentation

## 2023-08-05 DIAGNOSIS — I272 Pulmonary hypertension, unspecified: Secondary | ICD-10-CM | POA: Diagnosis not present

## 2023-08-05 DIAGNOSIS — I361 Nonrheumatic tricuspid (valve) insufficiency: Secondary | ICD-10-CM | POA: Insufficient documentation

## 2023-08-05 DIAGNOSIS — R000 Tachycardia, unspecified: Secondary | ICD-10-CM | POA: Insufficient documentation

## 2023-08-05 DIAGNOSIS — I509 Heart failure, unspecified: Secondary | ICD-10-CM | POA: Diagnosis not present

## 2023-08-05 LAB — ECHOCARDIOGRAM COMPLETE
AR max vel: 2.31 cm2
AV Peak grad: 9.2 mmHg
Ao pk vel: 1.52 m/s
Area-P 1/2: 7.37 cm2
MV VTI: 3.95 cm2
S' Lateral: 2.4 cm

## 2023-08-13 DIAGNOSIS — N183 Chronic kidney disease, stage 3 unspecified: Secondary | ICD-10-CM | POA: Diagnosis not present

## 2023-08-13 DIAGNOSIS — E559 Vitamin D deficiency, unspecified: Secondary | ICD-10-CM | POA: Diagnosis not present

## 2023-08-13 DIAGNOSIS — M51369 Other intervertebral disc degeneration, lumbar region without mention of lumbar back pain or lower extremity pain: Secondary | ICD-10-CM | POA: Diagnosis not present

## 2023-08-13 DIAGNOSIS — E119 Type 2 diabetes mellitus without complications: Secondary | ICD-10-CM | POA: Diagnosis not present

## 2023-08-13 DIAGNOSIS — E538 Deficiency of other specified B group vitamins: Secondary | ICD-10-CM | POA: Diagnosis not present

## 2023-08-13 DIAGNOSIS — E7800 Pure hypercholesterolemia, unspecified: Secondary | ICD-10-CM | POA: Diagnosis not present

## 2023-08-13 DIAGNOSIS — F17210 Nicotine dependence, cigarettes, uncomplicated: Secondary | ICD-10-CM | POA: Diagnosis not present

## 2023-08-13 DIAGNOSIS — I10 Essential (primary) hypertension: Secondary | ICD-10-CM | POA: Diagnosis not present

## 2023-08-13 DIAGNOSIS — Z79899 Other long term (current) drug therapy: Secondary | ICD-10-CM | POA: Diagnosis not present

## 2023-08-16 ENCOUNTER — Encounter (HOSPITAL_COMMUNITY): Payer: Self-pay | Admitting: Cardiology

## 2023-08-17 DIAGNOSIS — Z79899 Other long term (current) drug therapy: Secondary | ICD-10-CM | POA: Diagnosis not present

## 2023-08-24 DIAGNOSIS — Z1231 Encounter for screening mammogram for malignant neoplasm of breast: Secondary | ICD-10-CM | POA: Diagnosis not present

## 2023-08-26 DIAGNOSIS — M51369 Other intervertebral disc degeneration, lumbar region without mention of lumbar back pain or lower extremity pain: Secondary | ICD-10-CM | POA: Diagnosis not present

## 2023-08-26 DIAGNOSIS — E119 Type 2 diabetes mellitus without complications: Secondary | ICD-10-CM | POA: Diagnosis not present

## 2023-08-26 DIAGNOSIS — I10 Essential (primary) hypertension: Secondary | ICD-10-CM | POA: Diagnosis not present

## 2023-08-26 DIAGNOSIS — F17210 Nicotine dependence, cigarettes, uncomplicated: Secondary | ICD-10-CM | POA: Diagnosis not present

## 2023-08-26 DIAGNOSIS — Z79899 Other long term (current) drug therapy: Secondary | ICD-10-CM | POA: Diagnosis not present

## 2023-08-30 DIAGNOSIS — Z79899 Other long term (current) drug therapy: Secondary | ICD-10-CM | POA: Diagnosis not present

## 2023-09-03 ENCOUNTER — Telehealth (HOSPITAL_COMMUNITY): Payer: Self-pay | Admitting: Cardiology

## 2023-09-03 NOTE — Telephone Encounter (Signed)
 Called to confirm/remind patient of their appointment at the [facility] on 09/03/2023.   Appointment:   [] Confirmed  [x] Left mess   [] No answer/No voice mail  [] VM Full/unable to leave message  [] Phone not in service  Patient reminded to bring all medications and/or complete list.  Confirmed patient has transportation. Gave directions, instructed to utilize valet parking.

## 2023-09-04 NOTE — Progress Notes (Signed)
   ADVANCED HEART FAILURE FOLLOW UP CLINIC NOTE  Referring Physician: Victorio Grave, MD  Primary Care: Victorio Grave, MD Primary Cardiologist:  HPI: Gloria Lewis is a 68 y.o. female with a PMH of hypertension, pulmonary hypertension, tobacco abuse who presents for follow up     Admitted in 3/16 with chest pain that was somewhat positional.   She had a V/Q scan that actually suggested high risk for PE, but subsequent CTA chest did not show a PE.  There was RML and lingular scarring that may have created the appearance of PE on the V/Q scan.  Echo done later in 5/16 showed preserved LV systolic function but mildly dilated RV with PA systolic pressure 42 mmHg.  She had minimal obstruction or restriction on her PFTs, but DLCO was moderately reduced.    She had right and left heart cath in 4/19. This showed normal right and left heart filling pressures with mild pulmonary hypertension.  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      SUBJECTIVE: Patient reports that overall she is doing well. She has had palptiations and a racing heart beat since she had shoulder surgery in August. She denies any syncopal episodes, leg swelling, melena, bleeding, or chest pain. She is concerned due to mlutiple family members with medical problems. BP controlled today.   PMH, current medications, allergies, social history, and family history reviewed in epic.  PHYSICAL EXAM: There were no vitals filed for this visit.  GENERAL: Well nourished and in no apparent distress at rest.  PULM:  Normal work of breathing, clear to auscultation bilaterally. Respirations are unlabored.  CARDIAC:  JVP: not elevated         tachycardic rate with regular rhythm. No murmurs, rubs or gallops.  No edema. Warm and well perfused extremities. ABDOMEN: Soft, non-tender, non-distended. NEUROLOGIC: Patient is oriented x3 with no focal or lateralizing neurologic deficits.    DATA REVIEW  ECG: 06/15/23: Sinus tachycardia     ECHO: 06/2017: LVEF 60-65%, normal RV function  CATH: 11/2014: RA 9, PA 35/13 (22), PCWP 7, CO/CI 6.9/3.27, PVR 2.2 07/2017:  Mild LAD stenosis, RA 7, PA 42/13, mean 25, PCWP 9, CO/CI 6/2.75, PVR 2.7 wood units    ASSESSMENT & PLAN:  Pulmonary hypertension: Mild, noted on previous right heart catheterization.  Presumed primarily group 3.  Previously negative pulmonary embolism workup. -Repeat echocardiogram -Consider repeat sleep study  Sinus tachycardia: Noted on EKG, reports palpitations since her surgery.  Does not appear anemic and no report of blood loss. -CBC, BMP -Zio patch -Echocardiogram as above to evaluate for worsening right heart function -No unilateral leg swelling, will hold on venous Dopplers at this time  CAD: No chest pain currently. Prior [facility] with minimal disease. - Continue statin  Follow up in 3 months  Arta Lark, MD Advanced Heart Failure Mechanical Circulatory Support 09/04/23

## 2023-09-06 ENCOUNTER — Ambulatory Visit

## 2023-09-06 ENCOUNTER — Ambulatory Visit (HOSPITAL_COMMUNITY)
Admission: RE | Admit: 2023-09-06 | Discharge: 2023-09-06 | Disposition: A | Discharge status: Home / Self Care | Source: Ambulatory Visit | Attending: Cardiology | Admitting: Cardiology

## 2023-09-06 ENCOUNTER — Other Ambulatory Visit (HOSPITAL_COMMUNITY)

## 2023-09-06 VITALS — BP 120/80 | HR 112 | Wt 238.0 lb

## 2023-09-06 DIAGNOSIS — Z79899 Other long term (current) drug therapy: Secondary | ICD-10-CM | POA: Insufficient documentation

## 2023-09-06 DIAGNOSIS — I10 Essential (primary) hypertension: Secondary | ICD-10-CM | POA: Diagnosis not present

## 2023-09-06 DIAGNOSIS — I2510 Atherosclerotic heart disease of native coronary artery without angina pectoris: Secondary | ICD-10-CM | POA: Diagnosis not present

## 2023-09-06 DIAGNOSIS — R000 Tachycardia, unspecified: Secondary | ICD-10-CM | POA: Insufficient documentation

## 2023-09-06 DIAGNOSIS — I272 Pulmonary hypertension, unspecified: Secondary | ICD-10-CM | POA: Insufficient documentation

## 2023-09-06 DIAGNOSIS — R9431 Abnormal electrocardiogram [ECG] [EKG]: Secondary | ICD-10-CM | POA: Diagnosis not present

## 2023-09-06 DIAGNOSIS — R002 Palpitations: Secondary | ICD-10-CM

## 2023-09-06 LAB — LIPID PANEL
Cholesterol: 231 mg/dL — ABNORMAL HIGH (ref 0–200)
HDL: 39 mg/dL — ABNORMAL LOW (ref >40)
LDL Cholesterol: 167 mg/dL — ABNORMAL HIGH (ref 0–99)
Total CHOL/HDL Ratio: 5.9 ratio
Triglycerides: 123 mg/dL (ref <150)
VLDL: 25 mg/dL (ref 0–40)

## 2023-09-06 MED ORDER — METOPROLOL SUCCINATE ER 50 MG PO TB24: 50.0000 mg | ORAL_TABLET | Freq: Every day | ORAL | 3 refills | Status: DC

## 2023-09-06 NOTE — Patient Instructions (Signed)
 STOP Carvedilol .  START Toprol XL 50 mg daily.  Labs done today, your results will be available in MyChart, we will contact you for abnormal readings.  Your physician recommends that you schedule a follow-up appointment in: 4 months ( October) ** PLEASE CALL THE OFFICE IN [place] TO ARRANGE YOUR FOLLOW UP APPOINTMENT.**  If you have any questions or concerns before your next appointment please send us  a message through [place] or call our office at [phone].    TO LEAVE A MESSAGE FOR THE NURSE SELECT OPTION 2, PLEASE LEAVE A MESSAGE INCLUDING: YOUR NAME DATE OF BIRTH CALL BACK NUMBER REASON FOR CALL**this is important as we prioritize the call backs  YOU WILL RECEIVE A CALL BACK THE SAME DAY AS LONG AS YOU CALL BEFORE 4:00 PM  At the [facility], you and your health needs are our priority. As part of our continuing mission to provide you with exceptional heart care, we have created designated Provider Care Teams. These Care Teams include your primary Cardiologist (physician) and Advanced Practice Providers (APPs- Physician Assistants and Nurse Practitioners) who all work together to provide you with the care you need, when you need it.   You may see any of the following providers on your designated Care Team at your next follow up: Dr Jules Oar Dr Peder Bourdon Dr. Alwin Baars Dr. Arta Lark Amy Marijane Shoulders, NP Ruddy Corral, [place] [facility] [place], [place] Dennise Fitz, NP [place] Lee, NP Shawnee Dellen, NP Luster Salters, PharmD Bevely Brush, PharmD   Please be sure to bring in all your medications bottles to every appointment.    Thank you for choosing [facility]

## 2023-09-07 ENCOUNTER — Ambulatory Visit (HOSPITAL_COMMUNITY): Payer: Self-pay | Admitting: Cardiology

## 2023-09-07 DIAGNOSIS — E785 Hyperlipidemia, unspecified: Secondary | ICD-10-CM

## 2023-09-07 NOTE — Telephone Encounter (Addendum)
 Pt aware, agreeable, and verbalized understanding  Referral sent  ----- Message from Lauralee Poll sent at 09/07/2023  1:32 PM EDT ----- Elevated LDL, intolerant of statin. Can we please refer to lipid clinic?

## 2023-09-24 DIAGNOSIS — M51369 Other intervertebral disc degeneration, lumbar region without mention of lumbar back pain or lower extremity pain: Secondary | ICD-10-CM | POA: Diagnosis not present

## 2023-09-24 DIAGNOSIS — I10 Essential (primary) hypertension: Secondary | ICD-10-CM | POA: Diagnosis not present

## 2023-09-24 DIAGNOSIS — E1122 Type 2 diabetes mellitus with diabetic chronic kidney disease: Secondary | ICD-10-CM | POA: Diagnosis not present

## 2023-09-24 DIAGNOSIS — Z79899 Other long term (current) drug therapy: Secondary | ICD-10-CM | POA: Diagnosis not present

## 2023-09-24 DIAGNOSIS — F17210 Nicotine dependence, cigarettes, uncomplicated: Secondary | ICD-10-CM | POA: Diagnosis not present

## 2023-10-21 DIAGNOSIS — Z1231 Encounter for screening mammogram for malignant neoplasm of breast: Secondary | ICD-10-CM | POA: Diagnosis not present

## 2023-10-21 DIAGNOSIS — F17210 Nicotine dependence, cigarettes, uncomplicated: Secondary | ICD-10-CM | POA: Diagnosis not present

## 2023-10-21 DIAGNOSIS — E538 Deficiency of other specified B group vitamins: Secondary | ICD-10-CM | POA: Diagnosis not present

## 2023-10-21 DIAGNOSIS — M51369 Other intervertebral disc degeneration, lumbar region without mention of lumbar back pain or lower extremity pain: Secondary | ICD-10-CM | POA: Diagnosis not present

## 2023-10-21 DIAGNOSIS — E1122 Type 2 diabetes mellitus with diabetic chronic kidney disease: Secondary | ICD-10-CM | POA: Diagnosis not present

## 2023-10-21 DIAGNOSIS — Z79899 Other long term (current) drug therapy: Secondary | ICD-10-CM | POA: Diagnosis not present

## 2023-10-21 DIAGNOSIS — I10 Essential (primary) hypertension: Secondary | ICD-10-CM | POA: Diagnosis not present

## 2023-10-26 DIAGNOSIS — Z79899 Other long term (current) drug therapy: Secondary | ICD-10-CM | POA: Diagnosis not present

## 2023-10-27 ENCOUNTER — Ambulatory Visit (HOSPITAL_BASED_OUTPATIENT_CLINIC_OR_DEPARTMENT_OTHER): Admitting: Psychiatry

## 2023-10-27 VITALS — BP 149/90 | HR 89 | Ht 67.5 in | Wt 232.0 lb

## 2023-10-27 DIAGNOSIS — I10 Essential (primary) hypertension: Secondary | ICD-10-CM | POA: Diagnosis not present

## 2023-10-27 DIAGNOSIS — L0212 Furuncle of neck: Secondary | ICD-10-CM | POA: Diagnosis not present

## 2023-10-27 DIAGNOSIS — F3342 Major depressive disorder, recurrent, in full remission: Secondary | ICD-10-CM

## 2023-10-27 DIAGNOSIS — G43719 Chronic migraine without aura, intractable, without status migrainosus: Secondary | ICD-10-CM | POA: Diagnosis not present

## 2023-10-27 MED ORDER — NORTRIPTYLINE HCL 50 MG PO CAPS: ORAL_CAPSULE | ORAL | 4 refills | Status: DC

## 2023-10-27 MED ORDER — TEMAZEPAM 15 MG PO CAPS
15.0000 mg | ORAL_CAPSULE | Freq: Every evening | ORAL | 0 refills | Status: AC | PRN
Start: 2023-10-27 — End: ?

## 2023-10-27 MED ORDER — CLONAZEPAM 1 MG PO TABS: ORAL_TABLET | ORAL | 4 refills | Status: DC

## 2023-10-27 NOTE — Progress Notes (Signed)
 Patient ID: Gloria Lewis, female   DOB: 1955/12/10, 68 y.o.   MRN: [id number] [facility] MD Progress Note  10/27/2023 2:24 PM Gloria Lewis  MRN:  [id number] Subjective:  Shoulder hurting Principal Problem: Major Depression,recurent Mild Diagnosis: Adjustment disorder with an anxious mood stat    Today the patient is doing well.  She is here with one of her grandchildren who is 74 months old.  The patient is stable.  Her mood is good.  She denies anxiety.  She is lost just a little bit of weight.  She is alone nortriptyline  and Klonopin  and Restoril .  She is off the Remeron  which is counseled why she has lost a little weight.  Darryle her son still lives with her.  He is unemployed on psychiatric medications but is stable.  The patient is very stable 2.  Today her heart rate was back down to normal as was her blood pressure.  The patient takes OxyContin  for back pain.  Her prescriber is aware that she is on Klonopin  that she has been on now for years.  The patient has had no falls.  Generally the patient is doing well.  She is functioning very well.  The patient denies the use of alcohol or illicit drugs. Patient Active Problem List   Diagnosis Date Noted   Partial nontraumatic tear of left rotator cuff [M75.112] 12/01/2022   Infectious gastroenteritis [A09] 04/16/2016   Xerostomia [K11.7] 03/09/2016   Surgery, elective [Z41.9] 05/23/2015   S/P arthroscopy of shoulder [Z98.890] 05/23/2015   Chronic migraine without aura without status migrainosus, not intractable [G43.709] 10/18/2014   Tobacco abuse [Z72.0] 10/18/2014   Obesity [E66.9] 09/23/2014   COPD [J44.9] 09/02/2014   Pulmonary hypertension (HCC) [I27.20] 08/31/2014   Respiratory failure with hypoxia (HCC) [J96.91] 08/14/2014   Cigarette smoker [F17.210] 07/28/2014   Major depressive disorder, recurrent episode, moderate (HCC) [F33.1] 07/06/2014   Essential hypertension [I10]    SOB (shortness of breath) [R06.02] 06/21/2014   Precordial  pain [R07.2] 06/21/2014   Gastroesophageal reflux disease [K21.9] 06/21/2014   Chest pain [R07.9] 06/21/2014   HTN (hypertension) [I10]    Neck pain [M54.2] 01/08/2014   Severe episode of recurrent major depressive disorder (HCC) [F33.2] 10/14/2012   Schizoaffective disorder (HCC) [F25.9] 05/26/2012   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] 10/06/2010   Hyperparathyroidism, primary (HCC) [E21.0] 10/06/2010   DEGENERATIVE DISC DISEASE, LUMBOSACRAL SPINE [M51.379] 05/21/2007   DERMATOPHYTOSIS OF THE BODY [B35.4] 05/10/2007   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] 03/24/2007   Anxiety state [F41.1] 03/24/2007   DENTAL PAIN [K08.9] 03/24/2007   SHOULDER PAIN, LEFT [M25.519] 03/24/2007   Total Time spent with patient:30 min  Past Psychiatric History:   Past Medical History:  Past Medical History:  Diagnosis Date   Anginal pain (HCC)    admit 06/2014; had non-ischemic stress test   Anxiety    Bipolar 1 disorder (HCC)    Colon polyp    Complication of anesthesia    pt reports hx of waking up during anesthesia   CTS (carpal tunnel syndrome)    Depression    Diabetes mellitus without complication (HCC)    Fever blister    GERD (gastroesophageal reflux disease)    HA (headache)    HTN (hypertension)    Hypercholesterolemia    Migraines    OA (osteoarthritis)    Schizo-affective psychosis (HCC)     Past Surgical History:  Procedure Laterality Date   ANTERIOR CERVICAL DECOMP/DISCECTOMY FUSION  08/27/2011   Procedure: ANTERIOR CERVICAL DECOMPRESSION/DISCECTOMY FUSION  1 LEVEL/HARDWARE REMOVAL;  Surgeon: Alm GORMAN Molt, MD;  Location: [facility] NEURO ORS;  Service: Neurosurgery;  Laterality: Bilateral;  Cervical four-five Anterior cervical decompression/diskectomy, fusion, Plate, Removal of Cervical five-seven Plate   back injection     CARDIAC CATHETERIZATION N/A 11/09/2014   Procedure: Right Heart Cath;  Surgeon: Ezra GORMAN Shuck, MD;  Location: [facility] INVASIVE CV LAB;  Service: Cardiovascular;  Laterality: N/A;    CARPAL TUNNEL RELEASE  20110 rt/lt   rt x2 , lt x1   COLONOSCOPY  06/2017   [facility]   ESOPHAGOGASTRODUODENOSCOPY  06/2017   [facility]   HEMORRHOID SURGERY     MULTIPLE TOOTH EXTRACTIONS     NECK SURGERY  2009   PITUITARY SURGERY     Had gland removed from producing too much calcium    polp removed  2011   RIGHT/LEFT HEART CATH AND CORONARY ANGIOGRAPHY N/A 07/05/2017   Procedure: RIGHT/LEFT HEART CATH AND CORONARY ANGIOGRAPHY;  Surgeon: Shuck Ezra GORMAN, MD;  Location: [facility] INVASIVE CV LAB;  Service: Cardiovascular;  Laterality: N/A;   SHOULDER ARTHROSCOPY Left 12/01/2022   Procedure: ARTHROSCOPY SHOULDER, BICEPS TENODESIS, ROTATOR CUFF REPAIR;  Surgeon: Beverley Evalene BIRCH, MD;  Location: [facility];  Service: Orthopedics;  Laterality: Left;   SHOULDER ARTHROSCOPY WITH ROTATOR CUFF REPAIR Right 05/23/2015   Procedure: RIGHT SHOULDER ARTHROSCOPY WITH REMOVAL OF SUTURE ANCHOR AND POSSIBLE REVISION ROTATOR CUFF REPAIR;  Surgeon: Franky Pointer, MD;  Location: [facility] OR;  Service: Orthopedics;  Laterality: Right;   SHOULDER ARTHROSCOPY WITH SUBACROMIAL DECOMPRESSION Right 01/24/2015   Procedure: RIGHT SHOULDER ARTHROSCOPY WITH SUBACROMIAL DECOMPRESSION AD DISTAL CLAVICLE RESECTION ;  Surgeon: Franky Pointer, MD;  Location: [facility] OR;  Service: Orthopedics;  Laterality: Right;   VAGINAL DELIVERY     x3   Family History:  Family History  Problem Relation Age of Onset   Coronary artery disease Father    Cancer Father        head neck    Esophageal cancer Father    Hypertension Mother    Schizophrenia Mother    Diabetes Mother    Heart attack Mother    Depression Brother    Suicidality Brother    Prostate cancer Brother    Cancer Brother        bone marrow   Schizophrenia Maternal Grandmother    Anesthesia problems Neg Hx    Hypotension Neg Hx    Malignant hyperthermia Neg Hx    Pseudochol deficiency Neg Hx    Allergic rhinitis Neg Hx    Angioedema Neg Hx     Asthma Neg Hx    Atopy Neg Hx    Eczema Neg Hx    Immunodeficiency Neg Hx    Urticaria Neg Hx    Breast cancer Neg Hx    Family Psychiatric  History:  Social History:  Social History   Substance and Sexual Activity  Alcohol Use No   Alcohol/week: 0.0 standard drinks of alcohol     Social History   Substance and Sexual Activity  Drug Use No   Comment: hx crack addiction 2008    Social History   Socioeconomic History   Marital status: Single    Spouse name: Not on file   Number of children: 3   Years of education: 12   Highest education level: Some college, no degree  Occupational History   Occupation: disabled/retired  Tobacco Use   Smoking status: Some Days    Types: Cigars   Smokeless  tobacco: Never  Vaping Use   Vaping status: Some Days  Substance and Sexual Activity   Alcohol use: No    Alcohol/week: 0.0 standard drinks of alcohol   Drug use: No    Comment: hx crack addiction 2008   Sexual activity: Never  Other Topics Concern   Not on file  Social History Narrative   Lives in a two story home alone      [facility] in [place] [facility] pain management      Right handed   12th grade      Caffeine coffee 1 cup /day   Social Drivers of Health   Financial Resource Strain: Medium Risk (03/12/2017)   Overall Financial Resource Strain (CARDIA)    Difficulty of Paying Living Expenses: Somewhat hard  Food Insecurity: Food Insecurity Present (03/12/2017)   Hunger Vital Sign    Worried About Running Out of Food in the Last Year: Sometimes true    Ran Out of Food in the Last Year: Sometimes true  Transportation Needs: Unmet Transportation Needs (03/12/2017)   PRAPARE - Administrator, Civil Service (Medical): Yes    Lack of Transportation (Non-Medical): Yes  Physical Activity: Inactive (03/12/2017)   Exercise Vital Sign    Days of Exercise per Week: 0 days    Minutes of Exercise per Session: 0 min  Stress: Stress Concern Present (03/12/2017)    [employer] of Occupational Health - Occupational Stress Questionnaire    Feeling of Stress : Rather much  Social Connections: Moderately Integrated (03/12/2017)   Social Connection and Isolation Panel    Frequency of Communication with Friends and Family: More than three times a week    Frequency of Social Gatherings with Friends and Family: More than three times a week    Attends Religious Services: More than 4 times per year    Active Member of [employer] or Organizations: Yes    Attends Engineer, structural: More than 4 times per year    Marital Status: Never married   Additional Social History:                         Sleep: Good  Appetite:  Fair  Current Medications: Current Outpatient Medications  Medication Sig Dispense Refill   temazepam  (RESTORIL ) 15 MG capsule Take 1 capsule (15 mg total) by mouth at bedtime as needed for sleep. 30 capsule 0   acetaminophen  (TYLENOL ) 500 MG tablet Take 2 tablets (1,000 mg total) by mouth every 8 (eight) hours as needed for mild pain or moderate pain. 60 tablet 0   ASHWAGANDHA PO Take 1 capsule by mouth 2 (two) times daily. (Patient not taking: Reported on 09/06/2023)     aspirin  81 MG chewable tablet Chew 81 mg by mouth daily.     azelastine  (ASTELIN ) 0.1 % nasal spray Place 1 spray into both nostrils as needed for rhinitis. Use in each nostril as directed     baclofen  (LIORESAL ) 10 MG tablet Take 1 tablet (10 mg total) by mouth every 8 (eight) hours as needed for muscle spasms. 21 tablet 0   cetirizine  (ZYRTEC  ALLERGY ) 10 MG tablet Take 1 tablet (10 mg total) by mouth daily. 90 tablet 0   clonazePAM  (KLONOPIN ) 1 MG tablet Take 1 tablet qam & 2 tablets qhs 90 tablet 4   DEXILANT  30 MG capsule Take 1 capsule by mouth daily.     diphenhydrAMINE  (BENADRYL ) 25 MG tablet 1 tablet as needed  Ginger, Zingiber officinalis, (GINGER EXTRACT PO) Take 1 tablet by mouth as needed.     metoprolol  succinate (TOPROL  XL) 50 MG 24 hr  tablet Take 1 tablet (50 mg total) by mouth daily. 90 tablet 3   nortriptyline  (PAMELOR ) 50 MG capsule 2 qhs 60 capsule 4   ondansetron  (ZOFRAN ) 8 MG tablet Take 1 tablet (8 mg total) by mouth every 8 (eight) hours as needed for nausea or vomiting. 20 tablet 3   oxyCODONE -acetaminophen  (PERCOCET) 10-325 MG tablet Take 1 tablet by mouth in the morning, at noon, and at bedtime.     triamterene -hydrochlorothiazide  (DYAZIDE ) 50-25 MG capsule Take 1 capsule by mouth daily.     TURMERIC PO Take by mouth as needed.     valACYclovir (VALTREX) 1000 MG tablet Take 1,000 mg by mouth as needed.     No current facility-administered medications for this visit.    Lab Results: No results found for this or any previous visit (from the past 48 hours).  Physical Findings: AIMS:  , ,  ,  ,    CIWA:    COWS:     Musculoskeletal: Strength & Muscle Tone: within normal limits Gait & Station: normal Patient leans: N/A  Psychiatric Specialty Exam: ROS  Blood pressure (!) 149/90, pulse 89, height 5' 7.5 (1.715 m), weight 232 lb (105.2 kg).Body mass index is 35.8 kg/m.  General Appearance: Casual  Eye Contact::  Good  Speech:  Clear and Coherent  Volume:  Normal  Mood:  Euthymic  Affect:  Congruent  Thought Process:  Coherent  Orientation:  Full (Time, Place, and Person)  Thought Content:  WDL  Suicidal Thoughts:  No  Homicidal Thoughts:  No  Memory:  NA  Judgement:  Good  Insight:  Fair  Psychomotor Activity:  Normal  Concentration:  Fair  Recall:  Good  Fund of Knowledge:Good  Language: Good  Akathisia:  No  Handed:  Right  AIMS (if indicated):     Assets:   ADL's:  Intact  Cognition: WNL  Sleep:       Treatment Plan  10/27/2023, 2:24 PM   This patient's diagnosis is major clinical depression.  She takes nortriptyline  50 mg.  Her second problem is an adjustment disorder with an anxious mood state.  She takes Klonopin  1 mg 1 in the morning and 2 at night.  Her third problem is that  of insomnia she takes Restoril  15 mg at night.  The patient has done well off of Remeron .  The patient is sober and clean of all drugs.

## 2023-11-01 ENCOUNTER — Other Ambulatory Visit (HOSPITAL_COMMUNITY): Payer: Self-pay | Admitting: Nurse Practitioner

## 2023-11-01 DIAGNOSIS — Z8249 Family history of ischemic heart disease and other diseases of the circulatory system: Secondary | ICD-10-CM

## 2023-11-01 DIAGNOSIS — G4452 New daily persistent headache (NDPH): Secondary | ICD-10-CM

## 2023-11-04 ENCOUNTER — Encounter (HOSPITAL_COMMUNITY): Payer: Self-pay

## 2023-11-04 ENCOUNTER — Ambulatory Visit (HOSPITAL_COMMUNITY)
Admission: RE | Admit: 2023-11-04 | Discharge: 2023-11-04 | Disposition: A | Discharge status: Home / Self Care | Source: Ambulatory Visit | Attending: Nurse Practitioner | Admitting: Nurse Practitioner

## 2023-11-04 DIAGNOSIS — Z8249 Family history of ischemic heart disease and other diseases of the circulatory system: Secondary | ICD-10-CM

## 2023-11-04 DIAGNOSIS — G4452 New daily persistent headache (NDPH): Secondary | ICD-10-CM

## 2023-11-10 ENCOUNTER — Other Ambulatory Visit (HOSPITAL_COMMUNITY): Payer: Self-pay | Admitting: Family Medicine

## 2023-11-10 DIAGNOSIS — R519 Headache, unspecified: Secondary | ICD-10-CM

## 2023-11-10 DIAGNOSIS — Z8249 Family history of ischemic heart disease and other diseases of the circulatory system: Secondary | ICD-10-CM

## 2023-11-15 ENCOUNTER — Ambulatory Visit (HOSPITAL_COMMUNITY)
Admission: RE | Admit: 2023-11-15 | Discharge: 2023-11-15 | Disposition: A | Discharge status: Home / Self Care | Source: Ambulatory Visit | Attending: Family Medicine | Admitting: Family Medicine

## 2023-11-15 DIAGNOSIS — R519 Headache, unspecified: Secondary | ICD-10-CM | POA: Diagnosis not present

## 2023-11-15 DIAGNOSIS — R9089 Other abnormal findings on diagnostic imaging of central nervous system: Secondary | ICD-10-CM | POA: Diagnosis not present

## 2023-11-15 DIAGNOSIS — Z8249 Family history of ischemic heart disease and other diseases of the circulatory system: Secondary | ICD-10-CM | POA: Insufficient documentation

## 2023-11-17 ENCOUNTER — Other Ambulatory Visit: Payer: Self-pay

## 2023-11-17 ENCOUNTER — Emergency Department (HOSPITAL_COMMUNITY)

## 2023-11-17 ENCOUNTER — Telehealth (HOSPITAL_COMMUNITY): Payer: Self-pay

## 2023-11-17 ENCOUNTER — Emergency Department (HOSPITAL_COMMUNITY): Admission: EM | Admit: 2023-11-17 | Discharge: 2023-11-17 | Discharge status: Against Medical Advice

## 2023-11-17 DIAGNOSIS — R519 Headache, unspecified: Secondary | ICD-10-CM | POA: Insufficient documentation

## 2023-11-17 DIAGNOSIS — M79645 Pain in left finger(s): Secondary | ICD-10-CM | POA: Insufficient documentation

## 2023-11-17 DIAGNOSIS — Z5321 Procedure and treatment not carried out due to patient leaving prior to being seen by health care provider: Secondary | ICD-10-CM | POA: Insufficient documentation

## 2023-11-17 DIAGNOSIS — J984 Other disorders of lung: Secondary | ICD-10-CM | POA: Diagnosis not present

## 2023-11-17 DIAGNOSIS — I517 Cardiomegaly: Secondary | ICD-10-CM | POA: Diagnosis not present

## 2023-11-17 DIAGNOSIS — R079 Chest pain, unspecified: Secondary | ICD-10-CM | POA: Diagnosis not present

## 2023-11-17 DIAGNOSIS — R0789 Other chest pain: Secondary | ICD-10-CM | POA: Diagnosis not present

## 2023-11-17 LAB — CBC
HCT: 42.8 % (ref 36.0–46.0)
Hemoglobin: 14.1 g/dL (ref 12.0–15.0)
MCH: 28.8 pg (ref 26.0–34.0)
MCHC: 32.9 g/dL (ref 30.0–36.0)
MCV: 87.5 fL (ref 80.0–100.0)
Platelets: 374 K/uL (ref 150–400)
RBC: 4.89 MIL/uL (ref 3.87–5.11)
RDW: 14.5 % (ref 11.5–15.5)
WBC: 8 K/uL (ref 4.0–10.5)
nRBC: 0 % (ref 0.0–0.2)

## 2023-11-17 LAB — TROPONIN I (HIGH SENSITIVITY)
Troponin I (High Sensitivity): 5 ng/L (ref <18)
Troponin I (High Sensitivity): 5 ng/L (ref <18)

## 2023-11-17 LAB — BASIC METABOLIC PANEL WITH GFR
Anion gap: 9 (ref 5–15)
BUN: 7 mg/dL — ABNORMAL LOW (ref 8–23)
CO2: 28 mmol/L (ref 22–32)
Calcium: 9.3 mg/dL (ref 8.9–10.3)
Chloride: 103 mmol/L (ref 98–111)
Creatinine, Ser: 0.87 mg/dL (ref 0.44–1.00)
GFR, Estimated: >60 mL/min (ref >60)
Glucose, Bld: 94 mg/dL (ref 70–99)
Potassium: 3.8 mmol/L (ref 3.5–5.1)
Sodium: 140 mmol/L (ref 135–145)

## 2023-11-17 NOTE — ED Notes (Signed)
 Pt left without being seen.

## 2023-11-17 NOTE — ED Provider Triage Note (Signed)
 Emergency Medicine Provider Triage Evaluation Note  Gloria Lewis , a 68 y.o. female  was evaluated in triage.  Pt complains of multiple complaints.  Simile most concerned about her thumb that has been hurting for the past several days.  Is improved this morning compared to yesterday.  Pain to the base of her thumb.  No trauma, no numbness tingling changes in sensation.  Reportedly had similar thumb pain several years ago, unsure of what caused it.  Also having headache for close to the past month.  No vision loss, facial droop, unilateral weakness.  Had an MRI per her report yesterday.  Also complaining of right-sided chest pain intermittently. Review of Systems  Positive: Headache, chest pain Negative: Numbness tingling changes in sensation  Physical Exam  BP (!) 155/98 (BP Location: Left Arm)   Pulse 85   Temp 98.6 F (37 C)   Resp 17   Ht 5' 7 (1.702 m)   Wt 104.8 kg   SpO2 95%   BMI 36.18 kg/m  Gen:   Awake, no distress   Resp:  Normal effort  MSK:   Moves extremities without difficulty; no erythema swelling to thumb.  Neurovascular intact in the hand.  Brisk cap refill.  2+ radial pulses Other:  Chest pain reproducible with palpation.  Medical Decision Making  Medically screening exam initiated at 11:22 AM.  Appropriate orders placed.  Gloria Lewis was informed that the remainder of the evaluation will be completed by another provider, this initial triage assessment does not replace that evaluation, and the importance of remaining in the ED until their evaluation is complete.  68 year old with multiple complaints.  Headache for the past several weeks had MRI yesterday.  Has not been read.  No change in headache symptoms.  Chest pain is quite reproducible with palpation and consistent with MSK etiology.  Thumb with reassuring exam, does not appear to have septic joint or ischemic limb.   Neysa Caron PARAS, DO 11/17/23 1125

## 2023-11-17 NOTE — Group Note (Deleted)
 Date:  11/17/2023 Time:  2:22 PM  Group Topic/Focus:  Wellness Toolbox:   The focus of this group is to discuss various aspects of wellness, balancing those aspects and exploring ways to increase the ability to experience wellness.  Patients will create a wellness toolbox for use upon discharge.     Participation Level:  {BHH PARTICIPATION OZCZO:77735}  Participation Quality:  {BHH PARTICIPATION QUALITY:22265}  Affect:  {BHH AFFECT:22266}  Cognitive:  {BHH COGNITIVE:22267}  Insight: {BHH Insight2:20797}  Engagement in Group:  {BHH ENGAGEMENT IN HMNLE:77731}  Modes of Intervention:  {BHH MODES OF INTERVENTION:22269}  Additional Comments:  ***  Myra Curtistine BROCKS 11/17/2023, 2:22 PM

## 2023-11-17 NOTE — Telephone Encounter (Signed)
 Patient walked into clinic requesting to be seen today due to chest pain/and left arm/wrist/hand pain. Reports that this has been ongoing, came here from the pool today after exercise. Reports fatigue as well.   Spoke with Caffie Shed PA-C- she reviewed patients chart, and due to hx of PE/CAD she recommends patient report to the ER to be seen.   Made patient aware of this recommendation and she verbalized understanding. Offered to walk patient to the ER she states that she would go over.

## 2023-11-17 NOTE — ED Triage Notes (Signed)
 Pt. Stated, Jesus had a headache above right eye for over a month. I've had left thumb not able to move. Ive had some left side upper chest pain for a week.

## 2023-11-18 DIAGNOSIS — E1122 Type 2 diabetes mellitus with diabetic chronic kidney disease: Secondary | ICD-10-CM | POA: Diagnosis not present

## 2023-11-18 DIAGNOSIS — M51369 Other intervertebral disc degeneration, lumbar region without mention of lumbar back pain or lower extremity pain: Secondary | ICD-10-CM | POA: Diagnosis not present

## 2023-11-18 DIAGNOSIS — F17210 Nicotine dependence, cigarettes, uncomplicated: Secondary | ICD-10-CM | POA: Diagnosis not present

## 2023-11-18 DIAGNOSIS — E538 Deficiency of other specified B group vitamins: Secondary | ICD-10-CM | POA: Diagnosis not present

## 2023-11-18 DIAGNOSIS — I10 Essential (primary) hypertension: Secondary | ICD-10-CM | POA: Diagnosis not present

## 2023-11-18 DIAGNOSIS — Z79899 Other long term (current) drug therapy: Secondary | ICD-10-CM | POA: Diagnosis not present

## 2023-11-18 DIAGNOSIS — Z1231 Encounter for screening mammogram for malignant neoplasm of breast: Secondary | ICD-10-CM | POA: Diagnosis not present

## 2023-11-24 DIAGNOSIS — M1812 Unilateral primary osteoarthritis of first carpometacarpal joint, left hand: Secondary | ICD-10-CM | POA: Diagnosis not present

## 2023-11-25 ENCOUNTER — Encounter (HOSPITAL_COMMUNITY): Payer: Self-pay | Admitting: Cardiology

## 2023-11-25 ENCOUNTER — Other Ambulatory Visit (HOSPITAL_COMMUNITY): Payer: Self-pay

## 2023-11-25 ENCOUNTER — Ambulatory Visit (HOSPITAL_COMMUNITY)
Admission: RE | Admit: 2023-11-25 | Discharge: 2023-11-25 | Disposition: A | Discharge status: Home / Self Care | Source: Ambulatory Visit | Attending: Cardiology | Admitting: Cardiology

## 2023-11-25 VITALS — BP 120/88 | HR 106

## 2023-11-25 DIAGNOSIS — I272 Pulmonary hypertension, unspecified: Secondary | ICD-10-CM | POA: Insufficient documentation

## 2023-11-25 DIAGNOSIS — I10 Essential (primary) hypertension: Secondary | ICD-10-CM | POA: Insufficient documentation

## 2023-11-25 DIAGNOSIS — J449 Chronic obstructive pulmonary disease, unspecified: Secondary | ICD-10-CM | POA: Insufficient documentation

## 2023-11-25 DIAGNOSIS — I2510 Atherosclerotic heart disease of native coronary artery without angina pectoris: Secondary | ICD-10-CM | POA: Insufficient documentation

## 2023-11-25 DIAGNOSIS — R000 Tachycardia, unspecified: Secondary | ICD-10-CM | POA: Diagnosis not present

## 2023-11-25 DIAGNOSIS — Z79899 Other long term (current) drug therapy: Secondary | ICD-10-CM | POA: Diagnosis not present

## 2023-11-25 LAB — BASIC METABOLIC PANEL WITH GFR
Anion gap: 12 (ref 5–15)
BUN: 14 mg/dL (ref 8–23)
CO2: 27 mmol/L (ref 22–32)
Calcium: 10 mg/dL (ref 8.9–10.3)
Chloride: 106 mmol/L (ref 98–111)
Creatinine, Ser: 0.85 mg/dL (ref 0.44–1.00)
GFR, Estimated: >60 mL/min (ref >60)
Glucose, Bld: 98 mg/dL (ref 70–99)
Potassium: 4 mmol/L (ref 3.5–5.1)
Sodium: 145 mmol/L (ref 135–145)

## 2023-11-25 NOTE — Patient Instructions (Signed)
 There has been no changes to your medications.  Labs done today, your results will be available in MyChart, we will contact you for abnormal readings.  You are scheduled for a Cardiac Catheterization on Tuesday, September 16 with Dr. Odis Brownie.  1. Please arrive at the [facility] (Main Entrance A) at [facility]: [address] [place], [place] [address] at 5:30 AM (This time is 2 hour(s) before your procedure to ensure your preparation).   Free valet parking service is available. You will check in at ADMITTING. The support person will be asked to wait in the waiting room.  It is OK to have someone drop you off and come back when you are ready to be discharged.    Special note: Every effort is made to have your procedure done on time. Please understand that emergencies sometimes delay scheduled procedures.  2. Diet:NPO no eating or drinking after midnight. May have sips of water  with morning medications    3. Medication instructions in preparation for your procedure:   Contrast Allergy : No    On the morning of your procedure, take your Aspirin  81 mg and any morning medicines NOT listed above.  You may use sips of water .  6. Plan to go home the same day, you will only stay overnight if medically necessary. 7. Bring a current list of your medications and current insurance cards. 8. You MUST have a responsible person to drive you home. 9. Someone MUST be with you the first 24 hours after you arrive home or your discharge will be delayed. 10. Please wear clothes that are easy to get on and off and wear slip-on shoes.  Your physician recommends that you schedule a follow-up appointment in: 3 months ( November) ** PLEASE CALL THE OFFICE IN SEPTEMBER TO ARRANGE YOUR FOLLOW UP APPOINTMENT.**  If you have any questions or concerns before your next appointment please send us  a message through [place] or call our office at [phone].    TO LEAVE A MESSAGE FOR THE NURSE SELECT  OPTION 2, PLEASE LEAVE A MESSAGE INCLUDING: YOUR NAME DATE OF BIRTH CALL BACK NUMBER REASON FOR CALL**this is important as we prioritize the call backs  YOU WILL RECEIVE A CALL BACK THE SAME DAY AS LONG AS YOU CALL BEFORE 4:00 PM  At the [facility], you and your health needs are our priority. As part of our continuing mission to provide you with exceptional heart care, we have created designated Provider Care Teams. These Care Teams include your primary Cardiologist (physician) and Advanced Practice Providers (APPs- Physician Assistants and Nurse Practitioners) who all work together to provide you with the care you need, when you need it.   You may see any of the following providers on your designated Care Team at your next follow up: Dr Toribio Fuel Dr Ezra Shuck Dr. Ria Commander Dr. Morene Brownie Amy Lenetta, NP Caffie Shed, [place] [facility], [place] Beckey Coe, NP [place] Lee, NP Ellouise Class, NP Tinnie Redman, PharmD Jaun Bash, PharmD   Please be sure to bring in all your medications bottles to every appointment.    Thank you for choosing [facility]

## 2023-11-25 NOTE — Progress Notes (Signed)
   ADVANCED HEART FAILURE FOLLOW UP CLINIC NOTE  Referring Physician: Teresa Channel, MD  Primary Care: Teresa Channel, MD Primary Cardiologist:  HPI: Gloria Lewis is a 68 y.o. female with a PMH of hypertension, pulmonary hypertension, tobacco abuse who presents for follow up     Admitted in 3/16 with chest pain that was somewhat positional.   She had a V/Q scan that actually suggested high risk for PE, but subsequent CTA chest did not show a PE.  There was RML and lingular scarring that may have created the appearance of PE on the V/Q scan.  Echo done later in 5/16 showed preserved LV systolic function but mildly dilated RV with PA systolic pressure 42 mmHg.  She had minimal obstruction or restriction on her PFTs, but DLCO was moderately reduced.    She had right and left heart cath in 4/19. This showed normal right and left heart filling pressures with mild pulmonary hypertension.  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      SUBJECTIVE: Chief complaint is due to palpitations. She feels that her heart rate remains elevated most of the time and is concerned as she has a brother who has issues with atrial fibrillation. She denies any chest pain, lower extremity swelling.   PMH, current medications, allergies, social history, and family history reviewed in epic.  PHYSICAL EXAM: Vitals:   11/25/23 1526  BP: 120/88  Pulse: (!) 106  SpO2: 97%     GENERAL: NAD, well appearing PULM:  Normal work of breathing, CTAB CARDIAC:  JVP: flat         tachycardic rate with regular rhythm. No murmurs, rubs or gallops.  No edema. Warm and well perfused extremities. ABDOMEN: Soft, non-tender, non-distended. NEUROLOGIC: Patient is oriented x3 with no focal or lateralizing neurologic deficits.     DATA REVIEW  ECG: 06/15/23: Sinus tachycardia    ECHO: 06/2017: LVEF 60-65%, normal RV function  CATH: 11/2014: RA 9, PA 35/13 (22), PCWP 7, CO/CI 6.9/3.27, PVR 2.2 07/2017:  Mild LAD stenosis,  RA 7, PA 42/13, mean 25, PCWP 9, CO/CI 6/2.75, PVR 2.7 wood units    ASSESSMENT & PLAN:  Pulmonary hypertension: Mild, noted on previous right heart catheterization.  Does report some worsening shortness of breath, palptiations. Her continued sinus tachycardia also concerning given history. Will repeat RHC, and if elevated pressures will consider treating.  -RHC ordered -Will need repeat PFTs -Repeat sleep study  Sinus tachycardia: Noted on EKG, reports palpitations since her surgery. No change in symptoms with metoprolol . Will rule out RV failure as above, if relatively normal can try ivabradine.  -CBC, BMP -Metoprolol  succinate 50mg  daily, continue for now  CAD: No chest pain currently. Prior LHC with minimal disease. - Could not tolerate statin - Lipid clinic referral  Follow up in 3 months  I have reviewed the risks, indications, and alternatives to cardiac catheterization +/- angioplasty or stenting with the patient. Risks include but are not limited to bleeding, infection, vascular injury, stroke, myocardial infection, arrhythmia, kidney injury, radiation-related injury in the case of prolonged fluoroscopy use, emergency cardiac surgery, and death. The patient understands the risks of serious complication is low (<1%) and he agrees to proceed.    Morene Brownie, MD [facility] Mechanical Circulatory Support 11/27/23

## 2023-11-25 NOTE — H&P (View-Only) (Signed)
   ADVANCED HEART FAILURE FOLLOW UP CLINIC NOTE  Referring Physician: Teresa Channel, MD  Primary Care: Teresa Channel, MD Primary Cardiologist:  HPI: Gloria Lewis is a 68 y.o. female with a PMH of hypertension, pulmonary hypertension, tobacco abuse who presents for follow up     Admitted in 3/16 with chest pain that was somewhat positional.   She had a V/Q scan that actually suggested high risk for PE, but subsequent CTA chest did not show a PE.  There was RML and lingular scarring that may have created the appearance of PE on the V/Q scan.  Echo done later in 5/16 showed preserved LV systolic function but mildly dilated RV with PA systolic pressure 42 mmHg.  She had minimal obstruction or restriction on her PFTs, but DLCO was moderately reduced.    She had right and left heart cath in 4/19. This showed normal right and left heart filling pressures with mild pulmonary hypertension.  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      SUBJECTIVE: Chief complaint is due to palpitations. She feels that her heart rate remains elevated most of the time and is concerned as she has a brother who has issues with atrial fibrillation. She denies any chest pain, lower extremity swelling.   PMH, current medications, allergies, social history, and family history reviewed in epic.  PHYSICAL EXAM: Vitals:   11/25/23 1526  BP: 120/88  Pulse: (!) 106  SpO2: 97%     GENERAL: NAD, well appearing PULM:  Normal work of breathing, CTAB CARDIAC:  JVP: flat         tachycardic rate with regular rhythm. No murmurs, rubs or gallops.  No edema. Warm and well perfused extremities. ABDOMEN: Soft, non-tender, non-distended. NEUROLOGIC: Patient is oriented x3 with no focal or lateralizing neurologic deficits.     DATA REVIEW  ECG: 06/15/23: Sinus tachycardia    ECHO: 06/2017: LVEF 60-65%, normal RV function  CATH: 11/2014: RA 9, PA 35/13 (22), PCWP 7, CO/CI 6.9/3.27, PVR 2.2 07/2017:  Mild LAD stenosis,  RA 7, PA 42/13, mean 25, PCWP 9, CO/CI 6/2.75, PVR 2.7 wood units    ASSESSMENT & PLAN:  Pulmonary hypertension: Mild, noted on previous right heart catheterization.  Does report some worsening shortness of breath, palptiations. Her continued sinus tachycardia also concerning given history. Will repeat RHC, and if elevated pressures will consider treating.  -RHC ordered -Will need repeat PFTs -Repeat sleep study  Sinus tachycardia: Noted on EKG, reports palpitations since her surgery. No change in symptoms with metoprolol . Will rule out RV failure as above, if relatively normal can try ivabradine.  -CBC, BMP -Metoprolol  succinate 50mg  daily, continue for now  CAD: No chest pain currently. Prior LHC with minimal disease. - Could not tolerate statin - Lipid clinic referral  Follow up in 3 months  I have reviewed the risks, indications, and alternatives to cardiac catheterization +/- angioplasty or stenting with the patient. Risks include but are not limited to bleeding, infection, vascular injury, stroke, myocardial infection, arrhythmia, kidney injury, radiation-related injury in the case of prolonged fluoroscopy use, emergency cardiac surgery, and death. The patient understands the risks of serious complication is low (<1%) and he agrees to proceed.    Morene Brownie, MD Advanced Heart Failure Mechanical Circulatory Support 11/27/23

## 2023-12-16 DIAGNOSIS — E538 Deficiency of other specified B group vitamins: Secondary | ICD-10-CM | POA: Diagnosis not present

## 2023-12-16 DIAGNOSIS — R0602 Shortness of breath: Secondary | ICD-10-CM | POA: Diagnosis not present

## 2023-12-16 DIAGNOSIS — Z79899 Other long term (current) drug therapy: Secondary | ICD-10-CM | POA: Diagnosis not present

## 2023-12-16 DIAGNOSIS — F17210 Nicotine dependence, cigarettes, uncomplicated: Secondary | ICD-10-CM | POA: Diagnosis not present

## 2023-12-16 DIAGNOSIS — E1122 Type 2 diabetes mellitus with diabetic chronic kidney disease: Secondary | ICD-10-CM | POA: Diagnosis not present

## 2023-12-16 DIAGNOSIS — M51369 Other intervertebral disc degeneration, lumbar region without mention of lumbar back pain or lower extremity pain: Secondary | ICD-10-CM | POA: Diagnosis not present

## 2023-12-16 DIAGNOSIS — I10 Essential (primary) hypertension: Secondary | ICD-10-CM | POA: Diagnosis not present

## 2023-12-21 ENCOUNTER — Other Ambulatory Visit: Payer: Self-pay

## 2023-12-21 ENCOUNTER — Encounter (HOSPITAL_COMMUNITY)
Admission: RE | Disposition: A | Discharge status: Home / Self Care | Payer: Self-pay | Source: Home / Self Care | Attending: Cardiology

## 2023-12-21 ENCOUNTER — Ambulatory Visit (HOSPITAL_COMMUNITY)
Admission: RE | Admit: 2023-12-21 | Discharge: 2023-12-21 | Disposition: A | Discharge status: Home / Self Care | Attending: Cardiology | Admitting: Cardiology

## 2023-12-21 DIAGNOSIS — I272 Pulmonary hypertension, unspecified: Secondary | ICD-10-CM | POA: Insufficient documentation

## 2023-12-21 DIAGNOSIS — R000 Tachycardia, unspecified: Secondary | ICD-10-CM | POA: Diagnosis not present

## 2023-12-21 DIAGNOSIS — I509 Heart failure, unspecified: Secondary | ICD-10-CM

## 2023-12-21 DIAGNOSIS — I2510 Atherosclerotic heart disease of native coronary artery without angina pectoris: Secondary | ICD-10-CM | POA: Insufficient documentation

## 2023-12-21 DIAGNOSIS — I110 Hypertensive heart disease with heart failure: Secondary | ICD-10-CM | POA: Insufficient documentation

## 2023-12-21 DIAGNOSIS — Z720 Tobacco use: Secondary | ICD-10-CM | POA: Insufficient documentation

## 2023-12-21 DIAGNOSIS — Z79899 Other long term (current) drug therapy: Secondary | ICD-10-CM | POA: Insufficient documentation

## 2023-12-21 HISTORY — PX: RIGHT HEART CATH: CATH118263

## 2023-12-21 LAB — POCT I-STAT EG7
Acid-Base Excess: 1 mmol/L (ref 0.0–2.0)
Acid-Base Excess: 2 mmol/L (ref 0.0–2.0)
Bicarbonate: 28.1 mmol/L — ABNORMAL HIGH (ref 20.0–28.0)
Bicarbonate: 28.8 mmol/L — ABNORMAL HIGH (ref 20.0–28.0)
Calcium, Ion: 1.24 mmol/L (ref 1.15–1.40)
Calcium, Ion: 1.25 mmol/L (ref 1.15–1.40)
HCT: 43 % (ref 36.0–46.0)
HCT: 44 % (ref 36.0–46.0)
Hemoglobin: 14.6 g/dL (ref 12.0–15.0)
Hemoglobin: 15 g/dL (ref 12.0–15.0)
O2 Saturation: 55 %
O2 Saturation: 61 %
Potassium: 3.7 mmol/L (ref 3.5–5.1)
Potassium: 3.7 mmol/L (ref 3.5–5.1)
Sodium: 139 mmol/L (ref 135–145)
Sodium: 139 mmol/L (ref 135–145)
TCO2: 30 mmol/L (ref 22–32)
TCO2: 30 mmol/L (ref 22–32)
pCO2, Ven: 51.8 mmHg (ref 44–60)
pCO2, Ven: 53.1 mmHg (ref 44–60)
pH, Ven: 7.342 (ref 7.25–7.43)
pH, Ven: 7.342 (ref 7.25–7.43)
pO2, Ven: 31 mmHg — CL (ref 32–45)
pO2, Ven: 34 mmHg (ref 32–45)

## 2023-12-21 LAB — CBC
HCT: 44.2 % (ref 36.0–46.0)
Hemoglobin: 14.4 g/dL (ref 12.0–15.0)
MCH: 28.5 pg (ref 26.0–34.0)
MCHC: 32.6 g/dL (ref 30.0–36.0)
MCV: 87.4 fL (ref 80.0–100.0)
Platelets: 400 K/uL (ref 150–400)
RBC: 5.06 MIL/uL (ref 3.87–5.11)
RDW: 14.4 % (ref 11.5–15.5)
WBC: 8.3 K/uL (ref 4.0–10.5)
nRBC: 0 % (ref 0.0–0.2)

## 2023-12-21 SURGERY — RIGHT HEART CATH
Anesthesia: LOCAL

## 2023-12-21 MED ORDER — SODIUM CHLORIDE 0.9% FLUSH: 3.0000 mL | Freq: Two times a day (BID) | INTRAVENOUS | Status: DC

## 2023-12-21 MED ORDER — SODIUM CHLORIDE 0.9 % IV SOLN: 250.0000 mL | INTRAVENOUS | Status: DC | PRN

## 2023-12-21 MED ORDER — LIDOCAINE HCL (PF) 1 % IJ SOLN
INTRAMUSCULAR | Status: DC | PRN
  Administered 2023-12-21: 2 mL via INTRADERMAL

## 2023-12-21 MED ORDER — HEPARIN (PORCINE) IN NACL 1000-0.9 UT/500ML-% IV SOLN
INTRAVENOUS | Status: DC | PRN
  Administered 2023-12-21 (×2): 500 mL

## 2023-12-21 MED ORDER — METOPROLOL SUCCINATE ER 25 MG PO TB24: 25.0000 mg | ORAL_TABLET | Freq: Every day | ORAL | 3 refills | Status: DC

## 2023-12-21 MED ORDER — FREE WATER
250.0000 mL | Freq: Once | Status: AC
  Administered 2023-12-21: 250 mL via ORAL

## 2023-12-21 MED ORDER — LIDOCAINE HCL (PF) 1 % IJ SOLN
INTRAMUSCULAR | Status: AC
  Filled 2023-12-21: qty 30

## 2023-12-21 MED ORDER — SODIUM CHLORIDE 0.9% FLUSH: 3.0000 mL | INTRAVENOUS | Status: DC | PRN

## 2023-12-21 SURGICAL SUPPLY — 4 items
CATH SWAN GANZ 7F STRAIGHT (CATHETERS) IMPLANT
GLIDESHEATH SLENDER 7FR .021G (SHEATH) IMPLANT
KIT RIGHT HEART ACIST (MISCELLANEOUS) IMPLANT
PACK CARDIAC CATHETERIZATION (CUSTOM PROCEDURE TRAY) ×1 IMPLANT

## 2023-12-21 NOTE — Discharge Instructions (Addendum)

## 2023-12-21 NOTE — Interval H&P Note (Signed)
 History and Physical Interval Note:  12/21/2023 7:47 AM  Gloria Lewis  has presented today for surgery, with the diagnosis of chf.  The various methods of treatment have been discussed with the patient and family. After consideration of risks, benefits and other options for treatment, the patient has consented to  Procedure(s): RIGHT HEART CATH (N/A) as a surgical intervention.  The patient's history has been reviewed, patient examined, no change in status, stable for surgery.  I have reviewed the patient's chart and labs.  Questions were answered to the patient's satisfaction.     Gloria Lewis

## 2023-12-22 ENCOUNTER — Telehealth (HOSPITAL_COMMUNITY): Payer: Self-pay

## 2023-12-22 ENCOUNTER — Other Ambulatory Visit (HOSPITAL_COMMUNITY): Payer: Self-pay | Admitting: Pharmacist

## 2023-12-22 ENCOUNTER — Other Ambulatory Visit (HOSPITAL_COMMUNITY): Payer: Self-pay

## 2023-12-22 ENCOUNTER — Encounter (HOSPITAL_COMMUNITY): Payer: Self-pay | Admitting: Cardiology

## 2023-12-22 MED ORDER — TADALAFIL (PAH) 20 MG PO TABS: 20.0000 mg | ORAL_TABLET | Freq: Every day | ORAL | 3 refills | Status: DC

## 2023-12-22 NOTE — Telephone Encounter (Signed)
 Advanced Heart Failure Patient Advocate Encounter  Prior authorization for Tadalafil  [facility]) has been submitted and approved. Test billing returns $0 for 30 day supply. This plan limits a 30 day fill.  Key: [id number] Effective: 12/22/2023 to 04/05/2024  Rachel DEL, CPhT Rx Patient Advocate Phone: [phone]

## 2023-12-22 NOTE — Progress Notes (Signed)
 Received message from Dr. Zenaida that patient will be started on tadalafil  for [facility]. PA for tadalafil  approved. Tadalafil  prescription sent to [facility].   Tinnie Redman, PharmD, BCPS, BCCP, CPP Heart Failure Clinic Pharmacist [phone]

## 2023-12-24 DIAGNOSIS — L84 Corns and callosities: Secondary | ICD-10-CM | POA: Diagnosis not present

## 2023-12-24 DIAGNOSIS — R1319 Other dysphagia: Secondary | ICD-10-CM | POA: Diagnosis not present

## 2023-12-24 DIAGNOSIS — I272 Pulmonary hypertension, unspecified: Secondary | ICD-10-CM | POA: Diagnosis not present

## 2023-12-24 DIAGNOSIS — M545 Low back pain, unspecified: Secondary | ICD-10-CM | POA: Diagnosis not present

## 2023-12-24 DIAGNOSIS — L723 Sebaceous cyst: Secondary | ICD-10-CM | POA: Diagnosis not present

## 2023-12-24 DIAGNOSIS — E1142 Type 2 diabetes mellitus with diabetic polyneuropathy: Secondary | ICD-10-CM | POA: Diagnosis not present

## 2023-12-24 DIAGNOSIS — R052 Subacute cough: Secondary | ICD-10-CM | POA: Diagnosis not present

## 2023-12-29 DIAGNOSIS — Z1231 Encounter for screening mammogram for malignant neoplasm of breast: Secondary | ICD-10-CM | POA: Diagnosis not present

## 2023-12-29 DIAGNOSIS — I10 Essential (primary) hypertension: Secondary | ICD-10-CM | POA: Diagnosis not present

## 2023-12-29 DIAGNOSIS — F17210 Nicotine dependence, cigarettes, uncomplicated: Secondary | ICD-10-CM | POA: Diagnosis not present

## 2023-12-30 ENCOUNTER — Encounter (HOSPITAL_COMMUNITY): Payer: Self-pay

## 2024-01-05 DIAGNOSIS — E119 Type 2 diabetes mellitus without complications: Secondary | ICD-10-CM | POA: Diagnosis not present

## 2024-01-05 DIAGNOSIS — R519 Headache, unspecified: Secondary | ICD-10-CM | POA: Diagnosis not present

## 2024-01-05 DIAGNOSIS — Z961 Presence of intraocular lens: Secondary | ICD-10-CM | POA: Diagnosis not present

## 2024-01-05 DIAGNOSIS — H25812 Combined forms of age-related cataract, left eye: Secondary | ICD-10-CM | POA: Diagnosis not present

## 2024-01-05 DIAGNOSIS — H04123 Dry eye syndrome of bilateral lacrimal glands: Secondary | ICD-10-CM | POA: Diagnosis not present

## 2024-01-05 DIAGNOSIS — H10413 Chronic giant papillary conjunctivitis, bilateral: Secondary | ICD-10-CM | POA: Diagnosis not present

## 2024-01-10 DIAGNOSIS — K219 Gastro-esophageal reflux disease without esophagitis: Secondary | ICD-10-CM | POA: Diagnosis not present

## 2024-01-10 DIAGNOSIS — R1319 Other dysphagia: Secondary | ICD-10-CM | POA: Diagnosis not present

## 2024-01-10 DIAGNOSIS — R09A2 Foreign body sensation, throat: Secondary | ICD-10-CM | POA: Diagnosis not present

## 2024-01-14 DIAGNOSIS — Z1231 Encounter for screening mammogram for malignant neoplasm of breast: Secondary | ICD-10-CM | POA: Diagnosis not present

## 2024-01-14 DIAGNOSIS — F17210 Nicotine dependence, cigarettes, uncomplicated: Secondary | ICD-10-CM | POA: Diagnosis not present

## 2024-01-14 DIAGNOSIS — M51369 Other intervertebral disc degeneration, lumbar region without mention of lumbar back pain or lower extremity pain: Secondary | ICD-10-CM | POA: Diagnosis not present

## 2024-01-14 DIAGNOSIS — E1122 Type 2 diabetes mellitus with diabetic chronic kidney disease: Secondary | ICD-10-CM | POA: Diagnosis not present

## 2024-01-14 DIAGNOSIS — Z79899 Other long term (current) drug therapy: Secondary | ICD-10-CM | POA: Diagnosis not present

## 2024-01-14 DIAGNOSIS — E538 Deficiency of other specified B group vitamins: Secondary | ICD-10-CM | POA: Diagnosis not present

## 2024-01-14 DIAGNOSIS — Z23 Encounter for immunization: Secondary | ICD-10-CM | POA: Diagnosis not present

## 2024-01-14 DIAGNOSIS — I10 Essential (primary) hypertension: Secondary | ICD-10-CM | POA: Diagnosis not present

## 2024-01-18 DIAGNOSIS — Z79899 Other long term (current) drug therapy: Secondary | ICD-10-CM | POA: Diagnosis not present

## 2024-01-20 ENCOUNTER — Ambulatory Visit (HOSPITAL_COMMUNITY)
Admission: RE | Admit: 2024-01-20 | Discharge: 2024-01-20 | Disposition: A | Discharge status: Home / Self Care | Source: Ambulatory Visit | Attending: Cardiology | Admitting: Cardiology

## 2024-01-20 ENCOUNTER — Encounter (HOSPITAL_COMMUNITY): Payer: Self-pay | Admitting: Cardiology

## 2024-01-20 VITALS — BP 130/88 | HR 89 | Ht 67.0 in | Wt 226.8 lb

## 2024-01-20 DIAGNOSIS — I272 Pulmonary hypertension, unspecified: Secondary | ICD-10-CM | POA: Diagnosis not present

## 2024-01-20 DIAGNOSIS — Z79899 Other long term (current) drug therapy: Secondary | ICD-10-CM | POA: Insufficient documentation

## 2024-01-20 DIAGNOSIS — R000 Tachycardia, unspecified: Secondary | ICD-10-CM | POA: Diagnosis not present

## 2024-01-20 DIAGNOSIS — I2510 Atherosclerotic heart disease of native coronary artery without angina pectoris: Secondary | ICD-10-CM | POA: Insufficient documentation

## 2024-01-20 NOTE — Patient Instructions (Addendum)
 There has been no changes to your medications.  Your provider has ordered a high resolution CT of your Chest. You will be called to have this test arranged.  Your provider has ordered  LUNG FUNCTION test. You will be called to have this test arranged.  Your physician recommends that you schedule a follow-up appointment in: 3 months (January 2026) ** PLEASE CALL THE OFFICE IN DECEMBER TO ARRANGE YOUR FOLLOW UP APPOINTMENT,**  If you have any questions or concerns before your next appointment please send us  a message through [place] or call our office at [phone].    TO LEAVE A MESSAGE FOR THE NURSE SELECT OPTION 2, PLEASE LEAVE A MESSAGE INCLUDING: YOUR NAME DATE OF BIRTH CALL BACK NUMBER REASON FOR CALL**this is important as we prioritize the call backs  YOU WILL RECEIVE A CALL BACK THE SAME DAY AS LONG AS YOU CALL BEFORE 4:00 PM  At the Advanced Heart Failure Clinic, you and your health needs are our priority. As part of our continuing mission to provide you with exceptional heart care, we have created designated Provider Care Teams. These Care Teams include your primary Cardiologist (physician) and Advanced Practice Providers (APPs- Physician Assistants and Nurse Practitioners) who all work together to provide you with the care you need, when you need it.   You may see any of the following providers on your designated Care Team at your next follow up: Dr Toribio Fuel Dr Ezra Shuck Dr. Ria Commander Dr. Morene Brownie Amy Lenetta, NP Caffie Shed, [place] [facility] [place], [place] Beckey Coe, NP [place] Lee, NP Ellouise Class, NP Tinnie Redman, PharmD Jaun Bash, PharmD   Please be sure to bring in all your medications bottles to every appointment.    Thank you for choosing [facility]

## 2024-01-23 NOTE — Progress Notes (Signed)
   ADVANCED HEART FAILURE FOLLOW UP CLINIC NOTE  Referring Physician: Teresa Channel, MD  Primary Care: Teresa Channel, MD Primary Cardiologist:  HPI: Gloria Lewis is a 68 y.o. female with a PMH of hypertension, pulmonary hypertension, tobacco abuse who presents for follow up     Admitted in 3/16 with chest pain that was somewhat positional.   She had a V/Q scan that actually suggested high risk for PE, but subsequent CTA chest did not show a PE.  There was RML and lingular scarring that may have created the appearance of PE on the V/Q scan.  Echo done later in 5/16 showed preserved LV systolic function but mildly dilated RV with PA systolic pressure 42 mmHg.  She had minimal obstruction or restriction on her PFTs, but DLCO was moderately reduced.    She had right and left heart cath in 4/19. This showed normal right and left heart filling pressures with mild pulmonary hypertension.  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      SUBJECTIVE: Reports that she has been doing fair, her palpitations have improved since her last visit. Has started the tadalafil , but had some shaking symptoms with the first few doses. Will restart, but instructed to reach out if they persist. No other medication issues. Doing better with the lower dose of metoprolol .   PMH, current medications, allergies, social history, and family history reviewed in epic.  PHYSICAL EXAM: Vitals:   01/20/24 1404  BP: 130/88  Pulse: 89  SpO2: 97%     GENERAL: NAD, well appearing PULM:  Normal work of breathing, CTAB CARDIAC:  JVP: flat         Normal rate with regular rhythm. No murmurs, rubs or gallops.  No edema. Warm and well perfused extremities. ABDOMEN: Soft, non-tender, non-distended. NEUROLOGIC: Patient is oriented x3 with no focal or lateralizing neurologic deficits.      DATA REVIEW  ECG: 06/15/23: Sinus tachycardia    ECHO: 06/2017: LVEF 60-65%, normal RV function  CATH: 11/2014: RA 9, PA 35/13  (22), PCWP 7, CO/CI 6.9/3.27, PVR 2.2 07/2017:  Mild LAD stenosis, RA 7, PA 42/13, mean 25, PCWP 9, CO/CI 6/2.75, PVR 2.7 wood units 12/22/2023: RA 6, PA 45/24 (31), PCWP 12, TD CO/CI 4.04/1.92, PVR 4.7     ASSESSMENT & PLAN:  Pulmonary hypertension: Recent RHC with moderate disease, precapillary. NO significant prior history of pulmonary disease with prior normal PFTs, but will repeat along with HRCT. Starting on tadalafil , had some shaking with the first dose but willing to trial again. -Rhc reviewed in clinic - Repeat PFTs, HRCT, sleep study pending - Start tadalafil  20mg  daily - If not tolerating can transition to sildenafil  Sinus tachycardia: Noted on EKG, better controlled today after a few doses of tadafil, based on RHC suspect some compensatory.   -CBC, BMP -Continue metoprolol  12.5mg  daily  CAD: No chest pain currently. Prior LHC with minimal disease. - Could not tolerate statin - Lipid clinic referral  Follow up in 3 months    Morene Brownie, MD Advanced Heart Failure Mechanical Circulatory Support 01/23/24

## 2024-01-25 ENCOUNTER — Ambulatory Visit (HOSPITAL_COMMUNITY): Payer: Self-pay | Admitting: Cardiology

## 2024-01-25 ENCOUNTER — Ambulatory Visit (HOSPITAL_COMMUNITY)
Admission: RE | Admit: 2024-01-25 | Discharge: 2024-01-25 | Disposition: A | Discharge status: Home / Self Care | Source: Ambulatory Visit | Attending: Cardiology | Admitting: Cardiology

## 2024-01-25 DIAGNOSIS — I272 Pulmonary hypertension, unspecified: Secondary | ICD-10-CM | POA: Diagnosis not present

## 2024-01-25 LAB — PULMONARY FUNCTION TEST
DL/VA % pred: 96 %
DL/VA: 3.93 ml/min/mmHg/L
DLCO unc % pred: 60 %
DLCO unc: 13.09 ml/min/mmHg
FEF 25-75 Post: 1.12 L/s
FEF 25-75 Pre: 1.13 L/s
FEF2575-%Change-Post: 0 %
FEF2575-%Pred-Post: 50 %
FEF2575-%Pred-Pre: 50 %
FEV1-%Change-Post: 0 %
FEV1-%Pred-Post: 57 %
FEV1-%Pred-Pre: 57 %
FEV1-Post: 1.51 L
FEV1-Pre: 1.52 L
FEV1FVC-%Change-Post: 6 %
FEV1FVC-%Pred-Pre: 98 %
FEV6-%Change-Post: -6 %
FEV6-%Pred-Post: 56 %
FEV6-%Pred-Pre: 60 %
FEV6-Post: 1.89 L
FEV6-Pre: 2.02 L
FEV6FVC-%Pred-Post: 104 %
FEV6FVC-%Pred-Pre: 104 %
FVC-%Change-Post: -6 %
FVC-%Pred-Post: 54 %
FVC-%Pred-Pre: 58 %
FVC-Post: 1.89 L
FVC-Pre: 2.02 L
Post FEV1/FVC ratio: 80 %
Post FEV6/FVC ratio: 100 %
Pre FEV1/FVC ratio: 75 %
Pre FEV6/FVC Ratio: 100 %
RV % pred: 78 %
RV: 1.78 L
TLC % pred: 66 %
TLC: 3.67 L

## 2024-01-25 MED ORDER — ALBUTEROL SULFATE (2.5 MG/3ML) 0.083% IN NEBU
2.5000 mg | INHALATION_SOLUTION | Freq: Once | RESPIRATORY_TRACT | Status: AC
  Administered 2024-01-25: 2.5 mg via RESPIRATORY_TRACT

## 2024-02-02 ENCOUNTER — Ambulatory Visit (HOSPITAL_COMMUNITY): Admitting: Psychiatry

## 2024-02-15 ENCOUNTER — Ambulatory Visit (HOSPITAL_COMMUNITY): Admitting: Psychiatry

## 2024-03-01 ENCOUNTER — Telehealth (HOSPITAL_COMMUNITY): Payer: Self-pay | Admitting: Cardiology

## 2024-03-01 NOTE — Telephone Encounter (Signed)
 Patient called to report she has not heard anything about add'l testing ordered at last OV -will give number to radiology scheduling Unsure where patient is in workque but she can call to schedule directly    Patient also reported she is not going to continue tadalafil  reports is causes her legs to wiggle  Reports she attempted to try medication twice. First time x 2 days second time x 7 days Described as a neuropathy pain, numbness pain running all down leg   -advised would forward to provider if add'l changes are needed would return call otherwise can discuss in detail at follow up

## 2024-03-09 ENCOUNTER — Telehealth (HOSPITAL_COMMUNITY): Payer: Self-pay | Admitting: Cardiology

## 2024-03-09 NOTE — Telephone Encounter (Signed)
 Pt aware and voiced understanding

## 2024-03-09 NOTE — Telephone Encounter (Signed)
 Patient called to report chest pains Reports she feels like someone hit her in the ribs  Reports it hurts to breathe  No relief with pain medications or lidocaine  patches prescribed by PCP  -seen by PCP, xray clear, nothing found to describe pain, was advised to contact cardiology   Chest CT is not scheduled until 12/15  Pt reports she is in pain daily and would like some thing be done about it   Please advise

## 2024-03-09 NOTE — Telephone Encounter (Signed)
 Pt aware and voiced understanding Reports she will not take tadafil and will discuss at follow up directly with Dr Zenaida

## 2024-03-10 ENCOUNTER — Other Ambulatory Visit: Payer: Self-pay

## 2024-03-10 ENCOUNTER — Emergency Department (HOSPITAL_COMMUNITY)
Admission: EM | Admit: 2024-03-10 | Discharge: 2024-03-10 | Disposition: A | Discharge status: Home / Self Care | Source: Ambulatory Visit | Attending: Emergency Medicine | Admitting: Emergency Medicine

## 2024-03-10 DIAGNOSIS — Z7982 Long term (current) use of aspirin: Secondary | ICD-10-CM | POA: Insufficient documentation

## 2024-03-10 DIAGNOSIS — S29012A Strain of muscle and tendon of back wall of thorax, initial encounter: Secondary | ICD-10-CM | POA: Insufficient documentation

## 2024-03-10 DIAGNOSIS — E119 Type 2 diabetes mellitus without complications: Secondary | ICD-10-CM | POA: Insufficient documentation

## 2024-03-10 DIAGNOSIS — R059 Cough, unspecified: Secondary | ICD-10-CM | POA: Insufficient documentation

## 2024-03-10 DIAGNOSIS — Z91040 Latex allergy status: Secondary | ICD-10-CM | POA: Insufficient documentation

## 2024-03-10 DIAGNOSIS — J449 Chronic obstructive pulmonary disease, unspecified: Secondary | ICD-10-CM | POA: Insufficient documentation

## 2024-03-10 DIAGNOSIS — X58XXXA Exposure to other specified factors, initial encounter: Secondary | ICD-10-CM | POA: Insufficient documentation

## 2024-03-10 LAB — URINALYSIS, ROUTINE W REFLEX MICROSCOPIC
Bilirubin Urine: NEGATIVE
Glucose, UA: NEGATIVE mg/dL
Hgb urine dipstick: NEGATIVE
Ketones, ur: NEGATIVE mg/dL
Leukocytes,Ua: NEGATIVE
Nitrite: NEGATIVE
Protein, ur: NEGATIVE mg/dL
Specific Gravity, Urine: 1.008 (ref 1.005–1.030)
pH: 7 (ref 5.0–8.0)

## 2024-03-10 LAB — CBC
HCT: 42 % (ref 36.0–46.0)
Hemoglobin: 13.7 g/dL (ref 12.0–15.0)
MCH: 28.5 pg (ref 26.0–34.0)
MCHC: 32.6 g/dL (ref 30.0–36.0)
MCV: 87.3 fL (ref 80.0–100.0)
Platelets: 376 K/uL (ref 150–400)
RBC: 4.81 MIL/uL (ref 3.87–5.11)
RDW: 14.2 % (ref 11.5–15.5)
WBC: 6.5 K/uL (ref 4.0–10.5)
nRBC: 0 % (ref 0.0–0.2)

## 2024-03-10 LAB — COMPREHENSIVE METABOLIC PANEL WITH GFR
ALT: 12 U/L (ref 0–44)
AST: 21 U/L (ref 15–41)
Albumin: 3.5 g/dL (ref 3.5–5.0)
Alkaline Phosphatase: 95 U/L (ref 38–126)
Anion gap: 10 (ref 5–15)
BUN: 9 mg/dL (ref 8–23)
CO2: 30 mmol/L (ref 22–32)
Calcium: 9 mg/dL (ref 8.9–10.3)
Chloride: 99 mmol/L (ref 98–111)
Creatinine, Ser: 0.83 mg/dL (ref 0.44–1.00)
GFR, Estimated: >60 mL/min (ref >60)
Glucose, Bld: 112 mg/dL — ABNORMAL HIGH (ref 70–99)
Potassium: 3.7 mmol/L (ref 3.5–5.1)
Sodium: 139 mmol/L (ref 135–145)
Total Bilirubin: 0.6 mg/dL (ref 0.0–1.2)
Total Protein: 7.1 g/dL (ref 6.5–8.1)

## 2024-03-10 LAB — LIPASE, BLOOD: Lipase: 43 U/L (ref 11–51)

## 2024-03-10 MED ORDER — LIDOCAINE 5 % EX PTCH: 1.0000 | MEDICATED_PATCH | CUTANEOUS | 0 refills | Status: AC

## 2024-03-10 MED ORDER — IBUPROFEN 400 MG PO TABS
600.0000 mg | ORAL_TABLET | Freq: Once | ORAL | Status: AC
  Administered 2024-03-10: 600 mg via ORAL
  Filled 2024-03-10: qty 1

## 2024-03-10 MED ORDER — LIDOCAINE 5 % EX PTCH
1.0000 | MEDICATED_PATCH | CUTANEOUS | Status: DC
  Administered 2024-03-10: 1 via TRANSDERMAL
  Filled 2024-03-10: qty 1

## 2024-03-10 NOTE — ED Triage Notes (Signed)
 Patient reports right lateral abdominal /right flank pain for 2 weeks , denies injury , no hematuria ,emesis or diarrhea .

## 2024-03-10 NOTE — Discharge Instructions (Addendum)
 You were seen in the emerged dept for right sided back pain Your blood work urine test and EKG all looked okay Your chest x-ray that was taken previously was negative The pain is most likely caused from a pulled muscle probably from persistent coughing over the last month For this back pain you can take your pain medicine or ibuprofen  as directed Do not take more than is recommended You can also try the lidocaine  patches We have called in a prescription for lidocaine  patches for you to pick up from your pharmacy and use as directed for the back pain Return to the Emergency Department for severe pain or any other concerns Otherwise follow-up with your primary doctor within 1 week for reevaluation

## 2024-03-10 NOTE — ED Provider Notes (Signed)
 [place] EMERGENCY DEPARTMENT AT [facility] Provider Note   CSN: [id number] Arrival date & time: 03/10/24  9368     Patient presents with: Abdominal Pain and Flank Pain (Right Side)   Gloria Lewis is a 68 y.o. female.  With history of COPD asthma diabetes chronic back pain who presents to the ED for back pain.  Right lateral thoracic pain for the last 2 weeks.  Has had a month of URI symptoms and coughing.  Feels as though congestion is getting better but still has a dry cough.  No shortness of breath chest pain nausea vomiting fevers chills.  Denies dysuria hematuria and history of kidney stones.  Was seen yesterday at [facility] and chest x-ray was negative at that time.  Is followed by pain specialist and takes Percocet 10 mg 3 times daily for chronic back pain.    Abdominal Pain Flank Pain Associated symptoms include abdominal pain.       Prior to Admission medications   Medication Sig Start Date End Date Taking? Authorizing Provider  lidocaine  (LIDODERM ) 5 % Place 1 patch onto the skin daily. Remove & Discard patch within 12 hours or as directed by MD 03/10/24  Yes Pamella Ozell LABOR, DO  acetaminophen  (TYLENOL ) 500 MG tablet Take 2 tablets (1,000 mg total) by mouth every 8 (eight) hours as needed for mild pain or moderate pain. 12/01/22   Gawne, Meghan M, PA-C  ASHWAGANDHA PO Take 1 capsule by mouth 2 (two) times daily. Patient not taking: Reported on 01/20/2024    [provider]  aspirin  81 MG chewable tablet Chew 81 mg by mouth daily.    [provider]  aspirin -sod bicarb-citric acid (ALKA-SELTZER) 325 MG TBEF tablet Take 325 mg by mouth every 6 (six) hours as needed.    [provider]  azelastine  (ASTELIN ) 0.1 % nasal spray Place 1 spray into both nostrils as needed for rhinitis. Use in each nostril as directed    [provider]  cetirizine  (ZYRTEC ) 10 MG tablet Take 10 mg by mouth as needed for allergies.    [provider]  clonazePAM  (KLONOPIN ) 1 MG tablet Take 1 tablet qam & 2 tablets qhs 10/27/23   Plovsky, Elna, MD  dexlansoprazole  (DEXILANT ) 60 MG capsule Take 60 mg by mouth daily as needed (Acid reflux). 11/18/23   [provider]  diphenhydrAMINE  (BENADRYL ) 25 MG tablet Take 25 mg by mouth daily as needed for allergies. 08/12/15   [provider]  Ginger, Zingiber officinalis, (GINGER EXTRACT PO) Take 1 tablet by mouth daily.    [provider]  Methocarbamol  1000 MG TABS Take 1,000 mg by mouth 2 (two) times daily as needed (stiff shoulder pain). 10/01/23   [provider]  metoprolol  succinate (TOPROL  XL) 25 MG 24 hr tablet Take 1 tablet (25 mg total) by mouth daily. Patient taking differently: Take 12.5 mg by mouth daily. 12/21/23   Zenaida Morene PARAS, MD  nortriptyline  (PAMELOR ) 50 MG capsule 2 qhs Patient not taking: Reported on 01/20/2024 10/27/23   Tasia Elna, MD  oxyCODONE -acetaminophen  (PERCOCET) 10-325 MG tablet Take 1 tablet by mouth 3 (three) times daily as needed for pain.    [provider]  tadalafil , PAH, (ADCIRCA ) 20 MG tablet Take 1 tablet (20 mg total) by mouth daily. 12/22/23   Zenaida Morene PARAS, MD  temazepam  (RESTORIL ) 15 MG capsule Take 1 capsule (15 mg total) by mouth at bedtime as needed for sleep. 10/27/23   Plovsky,  Elna, MD  triamterene -hydrochlorothiazide  (MAXZIDE ) 75-50 MG tablet Take 1 tablet by mouth daily.    [provider]  TURMERIC PO Take 1 tablet by mouth daily. Patient taking differently: Take 1 tablet by mouth every other day.    [provider]  valACYclovir (VALTREX) 1000 MG tablet Take 1,000 mg by mouth daily as needed (Cold sores).    [provider]    Allergies: Effexor [venlafaxine hydrochloride], Latex, Penicillins, Zithromax [azithromycin dihydrate], Amlodipine , Atorvastatin , Butrans [buprenorphine], Cefuroxime, Codeine, Cortisone, Hydrocodone -acetaminophen , Iodine , Morphine ,  Nucynta [tapentadol], Nurtec [rimegepant sulfate], Other, Paroxetine, Pravastatin sodium, Prednisone , Propranolol  hcl, Sumatriptan , Varenicline tartrate, Venlafaxine, Amoxicillin, Hydrocodone , Paroxetine hcl, and Tramadol     Review of Systems  Gastrointestinal:  Positive for abdominal pain.  Genitourinary:  Positive for flank pain.    Updated Vital Signs BP 129/85 (BP Location: Right Arm)   Pulse 100   Temp 98.7 F (37.1 C)   Resp 18   SpO2 100%   Physical Exam Vitals and nursing note reviewed.  HENT:     Head: Normocephalic and atraumatic.  Eyes:     Pupils: Pupils are equal, round, and reactive to light.  Cardiovascular:     Rate and Rhythm: Normal rate and regular rhythm.  Pulmonary:     Effort: Pulmonary effort is normal.     Breath sounds: Normal breath sounds.  Abdominal:     Palpations: Abdomen is soft.     Tenderness: There is no abdominal tenderness.  Skin:    General: Skin is warm and dry.  Neurological:     Mental Status: She is alert.     Comments: Right lower thoracic tenderness to deep palpation No midline tenderness step-off deformity of back Cervical spine with full active range of motion no tenderness  Psychiatric:        Mood and Affect: Mood normal.     (all labs ordered are listed, but only abnormal results are displayed) Labs Reviewed  COMPREHENSIVE METABOLIC PANEL WITH GFR - Abnormal; Notable for the following components:      Result Value   Glucose, Bld 112 (*)    All other components within normal limits  LIPASE, BLOOD  CBC  URINALYSIS, ROUTINE W REFLEX MICROSCOPIC    EKG: EKG Interpretation Date/Time:  Friday March 10 2024 08:55:53 EST Ventricular Rate:  65 PR Interval:  176 QRS Duration:  110 QT Interval:  450 QTC Calculation: 468 R Axis:   41  Text Interpretation: Sinus rhythm Low voltage, precordial leads Confirmed by Pamella Sharper [id number]) on 03/10/2024 8:58:49 AM  Radiology: No results found.   Procedures    Medications Ordered in the ED  lidocaine  (LIDODERM ) 5 % 1 patch (1 patch Transdermal Patch Applied 03/10/24 0810)  ibuprofen  (ADVIL ) tablet 600 mg (600 mg Oral Given 03/10/24 0809)    Clinical Course as of 03/10/24 0859  Fri Mar 10, 2024  0858 No hematuria.  EKG without evidence of dysrhythmia or ischemic changes.  Patient reports some mild relief from lidocaine  patch.  Appropriate for discharge. [MP]    Clinical Course User Index [MP] Pamella Sharper LABOR, DO                                 Medical Decision Making 68 year old female with history as above recovering from URI symptoms presenting for right thoracic back pain.  No urinary symptoms fevers chills.  No shortness of breath.  Reproducible pain with tenderness in the  right lateral thoracic region consistent with muscle strain most likely from persistent coughing.  Counseled her on symptomatic management with lidocaine  patches and her pain meds at home.  She will follow-up with PCP.  Workup negative for laboratory abnormalities hematuria UTI.  EKG with no evidence of dysrhythmia or ischemic changes.  Amount and/or Complexity of Data Reviewed Labs: ordered.  Risk Prescription drug management.        Final diagnoses:  Strain of thoracic back region    ED Discharge Orders          Ordered    lidocaine  (LIDODERM ) 5 %  Every 24 hours        03/10/24 0741               Pamella Ozell LABOR, DO 03/10/24 [phone]

## 2024-03-15 ENCOUNTER — Ambulatory Visit (HOSPITAL_COMMUNITY): Admitting: Psychiatry

## 2024-03-20 ENCOUNTER — Telehealth (HOSPITAL_COMMUNITY): Payer: Self-pay | Admitting: Cardiology

## 2024-03-20 ENCOUNTER — Ambulatory Visit (HOSPITAL_COMMUNITY): Admission: RE | Admit: 2024-03-20 | Discharge: 2024-03-20 | Attending: Cardiology

## 2024-03-20 ENCOUNTER — Telehealth: Payer: Self-pay | Admitting: Internal Medicine

## 2024-03-20 DIAGNOSIS — I272 Pulmonary hypertension, unspecified: Secondary | ICD-10-CM | POA: Diagnosis present

## 2024-03-20 NOTE — Telephone Encounter (Signed)
 Routed in separate telephone note to Dr. Zenaida to review.

## 2024-03-20 NOTE — Telephone Encounter (Signed)
 Radiology called with report from chest CT  2. Irregular 7 mm right upper lobe nodule. Malignancy cannot be excluded. Recommend follow-up standard CT chest without contrast in 3 months. These results will be called to the ordering clinician or representative by the Radiologist Assistant, and communication documented in the PACS or [employer].   Message to provider as Gloria Lewis Full results/report available in chart

## 2024-03-20 NOTE — Telephone Encounter (Signed)
 Giving call report for CT results.   Transferred to triage.

## 2024-03-22 ENCOUNTER — Ambulatory Visit (HOSPITAL_COMMUNITY): Admitting: Psychiatry

## 2024-03-22 ENCOUNTER — Other Ambulatory Visit: Payer: Self-pay

## 2024-03-22 VITALS — BP 147/94 | HR 89 | Ht 67.5 in | Wt 234.0 lb

## 2024-03-22 DIAGNOSIS — F329 Major depressive disorder, single episode, unspecified: Secondary | ICD-10-CM

## 2024-03-22 MED ORDER — NORTRIPTYLINE HCL 50 MG PO CAPS: ORAL_CAPSULE | ORAL | 4 refills | Status: AC

## 2024-03-22 MED ORDER — CLONAZEPAM 1 MG PO TABS: ORAL_TABLET | ORAL | 4 refills | Status: AC

## 2024-03-22 NOTE — Progress Notes (Signed)
 Patient ID: Gloria Lewis, female   DOB: 1955/04/09, 68 y.o.   MRN: [id number] [facility] MD Progress Note  03/22/2024 4:38 PM [name]  MRN:  [id number] Subjective:  Shoulder hurting Principal Problem: Major Depression,recurent Mild Diagnosis: Adjustment disorder with an anxious mood stat     Patient is feeling a little down.  She knows it is related to the holidays.  Gloria Lewis her son is doing only fairly well.  The patient unfortunately had a good friend who died.  He chose to die as he was very medically ill.  Patient takes all her medicines as prescribed.  She is having some cardiac evaluations.  Generally she is sleeping only poorly her appetite is good.  She cannot stay asleep.  On the other hand the patient in some ways is very positive.  She has a 68-month-old grandson who she lives a great deal.  She sees him every week takes care of him on Thursdays.  Patient denies any use of alcohol or drugs. Patient Active Problem List   Diagnosis Date Noted   Partial nontraumatic tear of left rotator cuff [M75.112] 12/01/2022   Infectious gastroenteritis [A09] 04/16/2016   Xerostomia [K11.7] 03/09/2016   Surgery, elective [Z41.9] 05/23/2015   S/P arthroscopy of shoulder [Z98.890] 05/23/2015   Chronic migraine without aura without status migrainosus, not intractable [G43.709] 10/18/2014   Tobacco abuse [Z72.0] 10/18/2014   Obesity [E66.9] 09/23/2014   COPD [J44.9] 09/02/2014   Pulmonary hypertension (HCC) [I27.20] 08/31/2014   Respiratory failure with hypoxia (HCC) [J96.91] 08/14/2014   Cigarette smoker [F17.210] 07/28/2014   Major depressive disorder, recurrent episode, moderate (HCC) [F33.1] 07/06/2014   Essential hypertension [I10]    SOB (shortness of breath) [R06.02] 06/21/2014   Precordial pain [R07.2] 06/21/2014   Gastroesophageal reflux disease [K21.9] 06/21/2014   Chest pain [R07.9] 06/21/2014   HTN (hypertension) [I10]    Neck pain [M54.2] 01/08/2014   Severe episode of  recurrent major depressive disorder (HCC) [F33.2] 10/14/2012   Schizoaffective disorder (HCC) [F25.9] 05/26/2012   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] 10/06/2010   Hyperparathyroidism, primary [E21.0] 10/06/2010   DEGENERATIVE DISC DISEASE, LUMBOSACRAL SPINE [M51.379] 05/21/2007   DERMATOPHYTOSIS OF THE BODY [B35.4] 05/10/2007   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] 03/24/2007   Anxiety state [F41.1] 03/24/2007   DENTAL PAIN [K08.9] 03/24/2007   SHOULDER PAIN, LEFT [M25.519] 03/24/2007   Total Time spent with patient:30 min  Past Psychiatric History:   Past Medical History:  Past Medical History:  Diagnosis Date   Anginal pain    admit 06/2014; had non-ischemic stress test   Anxiety    Bipolar 1 disorder (HCC)    Colon polyp    Complication of anesthesia    pt reports hx of waking up during anesthesia   CTS (carpal tunnel syndrome)    Depression    Diabetes mellitus without complication (HCC)    Fever blister    GERD (gastroesophageal reflux disease)    HA (headache)    HTN (hypertension)    Hypercholesterolemia    Migraines    OA (osteoarthritis)    Schizo-affective psychosis (HCC)     Past Surgical History:  Procedure Laterality Date   ANTERIOR CERVICAL DECOMP/DISCECTOMY FUSION  08/27/2011   Procedure: ANTERIOR CERVICAL DECOMPRESSION/DISCECTOMY FUSION 1 LEVEL/HARDWARE REMOVAL;  Surgeon: Alm GORMAN Molt, MD;  Location: [facility];  Service: Neurosurgery;  Laterality: Bilateral;  Cervical four-five Anterior cervical decompression/diskectomy, fusion, Plate, Removal of Cervical five-seven Plate   back injection     CARDIAC CATHETERIZATION N/A 11/09/2014  Procedure: Right Heart Cath;  Surgeon: Ezra GORMAN Shuck, MD;  Location: [facility] INVASIVE CV LAB;  Service: Cardiovascular;  Laterality: N/A;   CARPAL TUNNEL RELEASE  20110 rt/lt   rt x2 , lt x1   COLONOSCOPY  06/2017   [facility]   ESOPHAGOGASTRODUODENOSCOPY  06/2017   [facility]   HEMORRHOID SURGERY      MULTIPLE TOOTH EXTRACTIONS     NECK SURGERY  2009   PITUITARY SURGERY     Had gland removed from producing too much calcium    polp removed  2011   RIGHT HEART CATH N/A 12/21/2023   Procedure: RIGHT HEART CATH;  Surgeon: Zenaida Morene PARAS, MD;  Location: [facility] INVASIVE CV LAB;  Service: Cardiovascular;  Laterality: N/A;   RIGHT/LEFT HEART CATH AND CORONARY ANGIOGRAPHY N/A 07/05/2017   Procedure: RIGHT/LEFT HEART CATH AND CORONARY ANGIOGRAPHY;  Surgeon: Shuck Ezra GORMAN, MD;  Location: [facility] INVASIVE CV LAB;  Service: Cardiovascular;  Laterality: N/A;   SHOULDER ARTHROSCOPY Left 12/01/2022   Procedure: ARTHROSCOPY SHOULDER, BICEPS TENODESIS, ROTATOR CUFF REPAIR;  Surgeon: Beverley Evalene BIRCH, MD;  Location: [facility];  Service: Orthopedics;  Laterality: Left;   SHOULDER ARTHROSCOPY WITH ROTATOR CUFF REPAIR Right 05/23/2015   Procedure: RIGHT SHOULDER ARTHROSCOPY WITH REMOVAL OF SUTURE ANCHOR AND POSSIBLE REVISION ROTATOR CUFF REPAIR;  Surgeon: Franky Pointer, MD;  Location: [facility] OR;  Service: Orthopedics;  Laterality: Right;   SHOULDER ARTHROSCOPY WITH SUBACROMIAL DECOMPRESSION Right 01/24/2015   Procedure: RIGHT SHOULDER ARTHROSCOPY WITH SUBACROMIAL DECOMPRESSION AD DISTAL CLAVICLE RESECTION ;  Surgeon: Franky Pointer, MD;  Location: [facility] OR;  Service: Orthopedics;  Laterality: Right;   VAGINAL DELIVERY     x3   Family History:  Family History  Problem Relation Age of Onset   Coronary artery disease Father    Cancer Father        head neck    Esophageal cancer Father    Hypertension Mother    Schizophrenia Mother    Diabetes Mother    Heart attack Mother    Depression Brother    Suicidality Brother    Prostate cancer Brother    Cancer Brother        bone marrow   Schizophrenia Maternal Grandmother    Anesthesia problems Neg Hx    Hypotension Neg Hx    Malignant hyperthermia Neg Hx    Pseudochol deficiency Neg Hx    Allergic rhinitis Neg Hx    Angioedema Neg Hx    Asthma Neg Hx     Atopy Neg Hx    Eczema Neg Hx    Immunodeficiency Neg Hx    Urticaria Neg Hx    Breast cancer Neg Hx    Family Psychiatric  History:  Social History:  Social History   Substance and Sexual Activity  Alcohol Use No   Alcohol/week: 0.0 standard drinks of alcohol     Social History   Substance and Sexual Activity  Drug Use No   Comment: hx crack addiction 2008    Social History   Socioeconomic History   Marital status: Single    Spouse name: Not on file   Number of children: 3   Years of education: 12   Highest education level: Some college, no degree  Occupational History   Occupation: disabled/retired  Tobacco Use   Smoking status: Some Days    Types: Cigars   Smokeless tobacco: Never  Vaping Use   Vaping status: Some Days  Substance and Sexual Activity  Alcohol use: No    Alcohol/week: 0.0 standard drinks of alcohol   Drug use: No    Comment: hx crack addiction 2008   Sexual activity: Never  Other Topics Concern   Not on file  Social History Narrative   Lives in a two story home alone      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      Right handed   12th grade      Caffeine coffee 1 cup /day   Social Drivers of Health   Tobacco Use: High Risk (01/20/2024)   Patient History    Smoking Tobacco Use: Some Days    Smokeless Tobacco Use: Never    Passive Exposure: Not on file  Financial Resource Strain: Not on file  Food Insecurity: Not on file  Transportation Needs: Not on file  Physical Activity: Not on file  Stress: Not on file  Social Connections: Not on file  Depression (PHQ2-9): High Risk (04/02/2021)   Depression (PHQ2-9)    PHQ-2 Score: 11  Alcohol Screen: Not on file  Housing: Not on file  Utilities: Not on file  Health Literacy: Not on file   Additional Social History:                         Sleep: Good  Appetite:  Fair  Current Medications: Current Outpatient Medications  Medication Sig Dispense Refill    acetaminophen  (TYLENOL ) 500 MG tablet Take 2 tablets (1,000 mg total) by mouth every 8 (eight) hours as needed for mild pain or moderate pain. 60 tablet 0   ASHWAGANDHA PO Take 1 capsule by mouth 2 (two) times daily. (Patient not taking: Reported on 01/20/2024)     aspirin  81 MG chewable tablet Chew 81 mg by mouth daily.     aspirin -sod bicarb-citric acid (ALKA-SELTZER) 325 MG TBEF tablet Take 325 mg by mouth every 6 (six) hours as needed.     azelastine  (ASTELIN ) 0.1 % nasal spray Place 1 spray into both nostrils as needed for rhinitis. Use in each nostril as directed     cetirizine  (ZYRTEC ) 10 MG tablet Take 10 mg by mouth as needed for allergies.     clonazePAM  (KLONOPIN ) 1 MG tablet Take 1 tablet qam & 2 tablets qhs 90 tablet 4   dexlansoprazole  (DEXILANT ) 60 MG capsule Take 60 mg by mouth daily as needed (Acid reflux).     diphenhydrAMINE  (BENADRYL ) 25 MG tablet Take 25 mg by mouth daily as needed for allergies.     Ginger, Zingiber officinalis, (GINGER EXTRACT PO) Take 1 tablet by mouth daily.     lidocaine  (LIDODERM ) 5 % Place 1 patch onto the skin daily. Remove & Discard patch within 12 hours or as directed by MD 30 patch 0   Methocarbamol  1000 MG TABS Take 1,000 mg by mouth 2 (two) times daily as needed (stiff shoulder pain).     metoprolol  succinate (TOPROL  XL) 25 MG 24 hr tablet Take 1 tablet (25 mg total) by mouth daily. (Patient taking differently: Take 12.5 mg by mouth daily.) 90 tablet 3   nortriptyline  (PAMELOR ) 50 MG capsule 2 qhs 60 capsule 4   oxyCODONE -acetaminophen  (PERCOCET) 10-325 MG tablet Take 1 tablet by mouth 3 (three) times daily as needed for pain.     tadalafil , PAH, (ADCIRCA ) 20 MG tablet Take 1 tablet (20 mg total) by mouth daily. (Patient not taking: Reported on 03/22/2024) 30 tablet 3   temazepam  (RESTORIL ) 15 MG capsule  2 qhs 60 capsule 4   triamterene -hydrochlorothiazide  (MAXZIDE ) 75-50 MG tablet Take 1 tablet by mouth daily.     TURMERIC PO Take 1 tablet by  mouth daily. (Patient taking differently: Take 1 tablet by mouth every other day.)     valACYclovir (VALTREX) 1000 MG tablet Take 1,000 mg by mouth daily as needed (Cold sores).     No current facility-administered medications for this visit.    Lab Results: No results found for this or any previous visit (from the past 48 hours).  Physical Findings: AIMS:  , ,  ,  ,    CIWA:    COWS:     Musculoskeletal: Strength & Muscle Tone: within normal limits Gait & Station: normal Patient leans: N/A  Psychiatric Specialty Exam: ROS  Blood pressure (!) 147/94, pulse 89, height 5' 7.5 (1.715 m), weight 234 lb (106.1 kg).Body mass index is 36.11 kg/m.  General Appearance: Casual  Eye Contact::  Good  Speech:  Clear and Coherent  Volume:  Normal  Mood:  Euthymic  Affect:  Congruent  Thought Process:  Coherent  Orientation:  Full (Time, Place, and Person)  Thought Content:  WDL  Suicidal Thoughts:  No  Homicidal Thoughts:  No  Memory:  NA  Judgement:  Good  Insight:  Fair  Psychomotor Activity:  Normal  Concentration:  Fair  Recall:  Good  Fund of Knowledge:Good  Language: Good  Akathisia:  No  Handed:  Right  AIMS (if indicated):     Assets:   ADL's:  Intact  Cognition: WNL  Sleep:       Treatment Plan  03/22/2024, 4:38 PM    This patient's diagnosis is major depression.  She continues taking nortriptyline  50 mg.  Today we are going to increase her Restoril  from 15 mg 1 at night to taking 2 at night.  So her second diagnosis is insomnia.  Further diagnosis is adjustment disorder with an anxious mood state.  She has a lot of precipitins to be anxious about.  Her son Gloria Lewis is not completely stable.  Patient takes Klonopin  1 mg 1 in the morning and 2 at night.  She will continue taking these medicines.  She will return to see me in just 2 months.  I believe she is relatively stable.

## 2024-03-23 ENCOUNTER — Other Ambulatory Visit (HOSPITAL_COMMUNITY): Payer: Self-pay

## 2024-04-20 ENCOUNTER — Telehealth (HOSPITAL_COMMUNITY): Payer: Self-pay | Admitting: Cardiology

## 2024-04-20 NOTE — Telephone Encounter (Signed)
 Called to confirm/remind patient of their appointment at the [facility] on 04/20/2024.   Appointment:   [x] Confirmed  [] Left mess   [] No answer/No voice mail  [] VM Full/unable to leave message  [] Phone not in service  Patient reminded to bring all medications and/or complete list.  Confirmed patient has transportation. Gave directions, instructed to utilize valet parking.

## 2024-04-21 ENCOUNTER — Ambulatory Visit (HOSPITAL_COMMUNITY)
Admission: RE | Admit: 2024-04-21 | Discharge: 2024-04-21 | Disposition: A | Discharge status: Home / Self Care | Source: Ambulatory Visit | Attending: Cardiology | Admitting: Cardiology

## 2024-04-21 ENCOUNTER — Encounter (HOSPITAL_COMMUNITY): Payer: Self-pay | Admitting: Cardiology

## 2024-04-21 VITALS — BP 146/102 | HR 79 | Ht 67.5 in | Wt 232.2 lb

## 2024-04-21 DIAGNOSIS — I2510 Atherosclerotic heart disease of native coronary artery without angina pectoris: Secondary | ICD-10-CM | POA: Insufficient documentation

## 2024-04-21 DIAGNOSIS — I10 Essential (primary) hypertension: Secondary | ICD-10-CM | POA: Insufficient documentation

## 2024-04-21 DIAGNOSIS — Z87891 Personal history of nicotine dependence: Secondary | ICD-10-CM | POA: Diagnosis not present

## 2024-04-21 DIAGNOSIS — I272 Pulmonary hypertension, unspecified: Secondary | ICD-10-CM | POA: Diagnosis present

## 2024-04-21 DIAGNOSIS — R911 Solitary pulmonary nodule: Secondary | ICD-10-CM | POA: Insufficient documentation

## 2024-04-21 MED ORDER — METOPROLOL SUCCINATE ER 25 MG PO TB24: 12.5000 mg | ORAL_TABLET | Freq: Every day | ORAL | 3 refills | Status: AC

## 2024-04-21 NOTE — Patient Instructions (Signed)
 STOP  Tadalifil.  Your provider has ordered a TC of your chest. You will be called to have this test arranged.  Your physician recommends that you schedule a follow-up appointment in: 6 months ( July) ** PLEASE CALL THE OFFICE IN MAY TO ARRANGE YOUR FOLLOW UP APPOINTMENT.**  If you have any questions or concerns before your next appointment please send us  a message through [place] or call our office at [phone].    TO LEAVE A MESSAGE FOR THE NURSE SELECT OPTION 2, PLEASE LEAVE A MESSAGE INCLUDING: YOUR NAME DATE OF BIRTH CALL BACK NUMBER REASON FOR CALL**this is important as we prioritize the call backs  YOU WILL RECEIVE A CALL BACK THE SAME DAY AS LONG AS YOU CALL BEFORE 4:00 PM  At the [facility], you and your health needs are our priority. As part of our continuing mission to provide you with exceptional heart care, we have created designated Provider Care Teams. These Care Teams include your primary Cardiologist (physician) and Advanced Practice Providers (APPs- Physician Assistants and Nurse Practitioners) who all work together to provide you with the care you need, when you need it.   You may see any of the following providers on your designated Care Team at your next follow up: Dr Toribio Fuel Dr Ezra Shuck Dr. Morene Brownie Greig Mosses, NP Caffie Shed, [place] [facility] [place], [place] Beckey Coe, NP Jordan Lee, NP Ellouise Class, NP Tinnie Redman, PharmD Jaun Bash, PharmD   Please be sure to bring in all your medications bottles to every appointment.    Thank you for choosing [facility]

## 2024-04-21 NOTE — Progress Notes (Signed)
" ° °  ADVANCED HE[facility] FAILURE FOLLOW UP CLINIC NOTE  Referring Physician: Teresa Channel, MD  Primary Care: Teresa Channel, MD Primary Cardiologist:  HPI: Gloria Lewis is a 69 y.o. female with a PMH of hypertension, pulmonary hypertension, tobacco abuse who presents for follow up     Admitted in 3/16 with chest pain that was somewhat positional.   She had a V/Q scan that actually suggested high risk for PE, but subsequent CTA chest did not show a PE.  There was RML and lingular scarring that may have created the appearance of PE on the V/Q scan.  Echo done later in 5/16 showed preserved LV systolic function but mildly dilated RV with PA systolic pressure 42 mmHg.  She had minimal obstruction or restriction on her PFTs, but DLCO was moderately reduced.    She had right and left heart cath in 4/19. This showed normal right and left heart filling pressures with mild pulmonary hypertension.  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      SUBJECTIVE: Reports that she has been doing fair, her palpitations have improved since her last visit. Has started the tadalafil , but had some shaking symptoms with the first few doses. Will restart, but instructed to reach out if they persist. No other medication issues. Doing better with the lower dose of metoprolol .   PMH, current medications, allergies, social history, and family history reviewed in epic.  PHYSICAL EXAM: There were no vitals filed for this visit.    GENERAL: NAD, well appearing PULM:  Normal work of breathing, CTAB CARDIAC:  JVP: flat         Normal rate with regular rhythm. No murmurs, rubs or gallops.  No edema. Warm and well perfused extremities. ABDOMEN: Soft, non-tender, non-distended. NEUROLOGIC: Patient is oriented x3 with no focal or lateralizing neurologic deficits.      DATA REVIEW  ECG: 06/15/23: Sinus tachycardia    ECHO: 06/2017: LVEF 60-65%, normal RV function  CATH: 11/2014: RA 9, PA 35/13 (22), PCWP 7, CO/CI  6.9/3.27, PVR 2.2 07/2017:  Mild LAD stenosis, RA 7, PA 42/13, mean 25, PCWP 9, CO/CI 6/2.75, PVR 2.7 wood units 12/22/2023: RA 6, PA 45/24 (31), PCWP 12, TD CO/CI 4.04/1.92, PVR 4.7     ASSESSMENT & PLAN:  Pulmonary hypertension: Recent RHC with moderate disease, precapillary. NO significant prior history of pulmonary disease with prior normal PFTs, but will repeat along with HRCT.  -Rhc reviewed in clinic - Repeat PFTs, HRCT, sleep study pending - Start tadalafil  20mg  daily - If not tolerating can transition to sildenafil  Sinus tachycardia: Noted on EKG, better controlled today after a few doses of tadafil, based on RHC suspect some compensatory.   -CBC, BMP -Continue metoprolol  12.5mg  daily  CAD: No chest pain currently. Prior LHC with minimal disease. - Could not tolerate statin - Lipid clinic referral  Follow up in 3 months    Morene Brownie, MD Advanced [facility] Mechanical Circulatory Support 04/21/24 "

## 2024-05-24 ENCOUNTER — Ambulatory Visit (HOSPITAL_COMMUNITY): Admitting: Psychiatry

## 2024-08-09 ENCOUNTER — Ambulatory Visit: Admitting: Dermatology
